# Patient Record
Sex: Male | Born: 1935
Health system: Southern US, Community
[De-identification: ages and names within clinical notes are randomized; demographics above are authoritative.]

## PROBLEM LIST (undated history)

## (undated) DIAGNOSIS — Z951 Presence of aortocoronary bypass graft: Secondary | ICD-10-CM

## (undated) DIAGNOSIS — I639 Cerebral infarction, unspecified: Secondary | ICD-10-CM

## (undated) DIAGNOSIS — F329 Major depressive disorder, single episode, unspecified: Secondary | ICD-10-CM

## (undated) DIAGNOSIS — M48062 Spinal stenosis, lumbar region with neurogenic claudication: Secondary | ICD-10-CM

## (undated) DIAGNOSIS — I219 Acute myocardial infarction, unspecified: Secondary | ICD-10-CM

## (undated) DIAGNOSIS — K219 Gastro-esophageal reflux disease without esophagitis: Secondary | ICD-10-CM

## (undated) DIAGNOSIS — R0789 Other chest pain: Secondary | ICD-10-CM

## (undated) DIAGNOSIS — R0600 Dyspnea, unspecified: Secondary | ICD-10-CM

## (undated) DIAGNOSIS — M21372 Foot drop, left foot: Secondary | ICD-10-CM

## (undated) DIAGNOSIS — Z66 Do not resuscitate: Secondary | ICD-10-CM

## (undated) DIAGNOSIS — R42 Dizziness and giddiness: Secondary | ICD-10-CM

## (undated) DIAGNOSIS — M1990 Unspecified osteoarthritis, unspecified site: Secondary | ICD-10-CM

## (undated) DIAGNOSIS — G4730 Sleep apnea, unspecified: Secondary | ICD-10-CM

## (undated) DIAGNOSIS — M21371 Foot drop, right foot: Secondary | ICD-10-CM

## (undated) DIAGNOSIS — F32A Depression, unspecified: Secondary | ICD-10-CM

## (undated) DIAGNOSIS — F0390 Unspecified dementia without behavioral disturbance: Secondary | ICD-10-CM

## (undated) DIAGNOSIS — E785 Hyperlipidemia, unspecified: Secondary | ICD-10-CM

## (undated) DIAGNOSIS — I2510 Atherosclerotic heart disease of native coronary artery without angina pectoris: Secondary | ICD-10-CM

## (undated) DIAGNOSIS — I10 Essential (primary) hypertension: Secondary | ICD-10-CM

## (undated) DIAGNOSIS — G629 Polyneuropathy, unspecified: Secondary | ICD-10-CM

## (undated) DIAGNOSIS — I350 Nonrheumatic aortic (valve) stenosis: Secondary | ICD-10-CM

## (undated) HISTORY — DX: Spinal stenosis, lumbar region with neurogenic claudication: M48.062

## (undated) HISTORY — DX: Sleep apnea, unspecified: G47.30

## (undated) HISTORY — PX: HEMORROIDECTOMY: SUR656

## (undated) HISTORY — DX: Foot drop, left foot: M21.372

## (undated) HISTORY — DX: Nonrheumatic aortic (valve) stenosis: I35.0

## (undated) HISTORY — DX: Do not resuscitate: Z66

## (undated) HISTORY — DX: Foot drop, right foot: M21.371

## (undated) HISTORY — DX: Polyneuropathy, unspecified: G62.9

## (undated) HISTORY — PX: CORONARY ARTERY BYPASS GRAFT: SHX141

## (undated) HISTORY — PX: CATARACT EXTRACTION, BILATERAL: SHX1313

## (undated) HISTORY — PX: EYE SURGERY: SHX253

## (undated) HISTORY — DX: Atherosclerotic heart disease of native coronary artery without angina pectoris: I25.10

## (undated) HISTORY — DX: Presence of aortocoronary bypass graft: Z95.1

## (undated) HISTORY — DX: Dizziness and giddiness: R42

## (undated) HISTORY — DX: Major depressive disorder, single episode, unspecified: F32.9

## (undated) HISTORY — PX: CARDIAC SURGERY: SHX584

## (undated) HISTORY — DX: Cerebral infarction, unspecified: I63.9

## (undated) HISTORY — PX: COLONOSCOPY: SHX174

## (undated) HISTORY — DX: Other chest pain: R07.89

## (undated) HISTORY — DX: Essential (primary) hypertension: I10

## (undated) HISTORY — DX: Unspecified dementia, unspecified severity, without behavioral disturbance, psychotic disturbance, mood disturbance, and anxiety: F03.90

## (undated) HISTORY — PX: CARDIAC CATHETERIZATION: SHX172

## (undated) HISTORY — DX: Depression, unspecified: F32.A

## (undated) HISTORY — DX: Hyperlipidemia, unspecified: E78.5

## (undated) HISTORY — DX: Unspecified osteoarthritis, unspecified site: M19.90

---

## 2014-04-26 DIAGNOSIS — H2513 Age-related nuclear cataract, bilateral: Secondary | ICD-10-CM | POA: Diagnosis not present

## 2014-04-29 DIAGNOSIS — I2510 Atherosclerotic heart disease of native coronary artery without angina pectoris: Secondary | ICD-10-CM | POA: Diagnosis not present

## 2014-04-29 DIAGNOSIS — E785 Hyperlipidemia, unspecified: Secondary | ICD-10-CM | POA: Diagnosis not present

## 2014-04-29 DIAGNOSIS — Z6829 Body mass index (BMI) 29.0-29.9, adult: Secondary | ICD-10-CM | POA: Diagnosis not present

## 2014-04-29 DIAGNOSIS — I10 Essential (primary) hypertension: Secondary | ICD-10-CM | POA: Diagnosis not present

## 2014-05-25 DIAGNOSIS — M21372 Foot drop, left foot: Secondary | ICD-10-CM | POA: Diagnosis not present

## 2014-06-14 DIAGNOSIS — M1990 Unspecified osteoarthritis, unspecified site: Secondary | ICD-10-CM | POA: Diagnosis not present

## 2014-10-26 DIAGNOSIS — Z6828 Body mass index (BMI) 28.0-28.9, adult: Secondary | ICD-10-CM | POA: Diagnosis not present

## 2014-10-26 DIAGNOSIS — I10 Essential (primary) hypertension: Secondary | ICD-10-CM | POA: Diagnosis not present

## 2014-10-26 DIAGNOSIS — M4010 Other secondary kyphosis, site unspecified: Secondary | ICD-10-CM | POA: Diagnosis not present

## 2014-10-26 DIAGNOSIS — Z1389 Encounter for screening for other disorder: Secondary | ICD-10-CM | POA: Diagnosis not present

## 2014-10-26 DIAGNOSIS — Z0000 Encounter for general adult medical examination without abnormal findings: Secondary | ICD-10-CM | POA: Diagnosis not present

## 2014-10-26 DIAGNOSIS — I2510 Atherosclerotic heart disease of native coronary artery without angina pectoris: Secondary | ICD-10-CM | POA: Diagnosis not present

## 2014-10-26 DIAGNOSIS — N401 Enlarged prostate with lower urinary tract symptoms: Secondary | ICD-10-CM | POA: Diagnosis not present

## 2014-10-26 DIAGNOSIS — I739 Peripheral vascular disease, unspecified: Secondary | ICD-10-CM | POA: Diagnosis not present

## 2014-10-26 DIAGNOSIS — E785 Hyperlipidemia, unspecified: Secondary | ICD-10-CM | POA: Diagnosis not present

## 2014-11-09 DIAGNOSIS — Z6828 Body mass index (BMI) 28.0-28.9, adult: Secondary | ICD-10-CM | POA: Diagnosis not present

## 2014-11-09 DIAGNOSIS — M7550 Bursitis of unspecified shoulder: Secondary | ICD-10-CM | POA: Diagnosis not present

## 2014-11-25 DIAGNOSIS — M21379 Foot drop, unspecified foot: Secondary | ICD-10-CM | POA: Diagnosis not present

## 2014-11-25 DIAGNOSIS — M1612 Unilateral primary osteoarthritis, left hip: Secondary | ICD-10-CM | POA: Diagnosis not present

## 2014-11-25 DIAGNOSIS — M5432 Sciatica, left side: Secondary | ICD-10-CM | POA: Diagnosis not present

## 2015-02-01 DIAGNOSIS — J180 Bronchopneumonia, unspecified organism: Secondary | ICD-10-CM | POA: Diagnosis not present

## 2015-02-01 DIAGNOSIS — Z6828 Body mass index (BMI) 28.0-28.9, adult: Secondary | ICD-10-CM | POA: Diagnosis not present

## 2015-02-01 DIAGNOSIS — J019 Acute sinusitis, unspecified: Secondary | ICD-10-CM | POA: Diagnosis not present

## 2015-04-19 DIAGNOSIS — M25552 Pain in left hip: Secondary | ICD-10-CM | POA: Diagnosis not present

## 2015-04-19 DIAGNOSIS — M549 Dorsalgia, unspecified: Secondary | ICD-10-CM | POA: Diagnosis not present

## 2015-04-19 DIAGNOSIS — M5416 Radiculopathy, lumbar region: Secondary | ICD-10-CM | POA: Diagnosis not present

## 2015-04-27 DIAGNOSIS — I10 Essential (primary) hypertension: Secondary | ICD-10-CM | POA: Diagnosis not present

## 2015-04-27 DIAGNOSIS — I2510 Atherosclerotic heart disease of native coronary artery without angina pectoris: Secondary | ICD-10-CM | POA: Diagnosis not present

## 2015-04-27 DIAGNOSIS — E785 Hyperlipidemia, unspecified: Secondary | ICD-10-CM | POA: Diagnosis not present

## 2015-04-27 DIAGNOSIS — N401 Enlarged prostate with lower urinary tract symptoms: Secondary | ICD-10-CM | POA: Diagnosis not present

## 2015-04-27 DIAGNOSIS — Z6828 Body mass index (BMI) 28.0-28.9, adult: Secondary | ICD-10-CM | POA: Diagnosis not present

## 2015-05-03 DIAGNOSIS — M4806 Spinal stenosis, lumbar region: Secondary | ICD-10-CM | POA: Diagnosis not present

## 2015-05-03 DIAGNOSIS — M5117 Intervertebral disc disorders with radiculopathy, lumbosacral region: Secondary | ICD-10-CM | POA: Diagnosis not present

## 2015-05-04 DIAGNOSIS — M4806 Spinal stenosis, lumbar region: Secondary | ICD-10-CM | POA: Diagnosis not present

## 2015-05-09 DIAGNOSIS — J180 Bronchopneumonia, unspecified organism: Secondary | ICD-10-CM | POA: Diagnosis not present

## 2015-05-09 DIAGNOSIS — J9801 Acute bronchospasm: Secondary | ICD-10-CM | POA: Diagnosis not present

## 2015-06-06 DIAGNOSIS — M7582 Other shoulder lesions, left shoulder: Secondary | ICD-10-CM | POA: Diagnosis not present

## 2015-06-06 DIAGNOSIS — Z6827 Body mass index (BMI) 27.0-27.9, adult: Secondary | ICD-10-CM | POA: Diagnosis not present

## 2015-06-06 DIAGNOSIS — F418 Other specified anxiety disorders: Secondary | ICD-10-CM | POA: Diagnosis not present

## 2015-06-24 DIAGNOSIS — J019 Acute sinusitis, unspecified: Secondary | ICD-10-CM | POA: Diagnosis not present

## 2015-07-11 DIAGNOSIS — F329 Major depressive disorder, single episode, unspecified: Secondary | ICD-10-CM | POA: Diagnosis not present

## 2015-07-25 DIAGNOSIS — M129 Arthropathy, unspecified: Secondary | ICD-10-CM | POA: Diagnosis not present

## 2015-07-25 DIAGNOSIS — G5602 Carpal tunnel syndrome, left upper limb: Secondary | ICD-10-CM | POA: Diagnosis not present

## 2015-08-23 DIAGNOSIS — J309 Allergic rhinitis, unspecified: Secondary | ICD-10-CM | POA: Diagnosis not present

## 2015-08-23 DIAGNOSIS — G20 Parkinson's disease: Secondary | ICD-10-CM | POA: Diagnosis not present

## 2015-08-29 DIAGNOSIS — G250 Essential tremor: Secondary | ICD-10-CM | POA: Diagnosis not present

## 2015-08-29 DIAGNOSIS — R251 Tremor, unspecified: Secondary | ICD-10-CM | POA: Diagnosis not present

## 2015-08-29 DIAGNOSIS — M4806 Spinal stenosis, lumbar region: Secondary | ICD-10-CM | POA: Diagnosis not present

## 2015-08-29 DIAGNOSIS — G609 Hereditary and idiopathic neuropathy, unspecified: Secondary | ICD-10-CM | POA: Diagnosis not present

## 2015-08-29 DIAGNOSIS — M21372 Foot drop, left foot: Secondary | ICD-10-CM | POA: Diagnosis not present

## 2015-09-22 DIAGNOSIS — M25311 Other instability, right shoulder: Secondary | ICD-10-CM | POA: Diagnosis not present

## 2015-10-06 DIAGNOSIS — Z6826 Body mass index (BMI) 26.0-26.9, adult: Secondary | ICD-10-CM | POA: Diagnosis not present

## 2015-10-06 DIAGNOSIS — F329 Major depressive disorder, single episode, unspecified: Secondary | ICD-10-CM | POA: Diagnosis not present

## 2015-10-26 DIAGNOSIS — E785 Hyperlipidemia, unspecified: Secondary | ICD-10-CM | POA: Diagnosis not present

## 2015-10-26 DIAGNOSIS — M21372 Foot drop, left foot: Secondary | ICD-10-CM | POA: Diagnosis not present

## 2015-10-26 DIAGNOSIS — N400 Enlarged prostate without lower urinary tract symptoms: Secondary | ICD-10-CM | POA: Diagnosis not present

## 2015-10-26 DIAGNOSIS — I10 Essential (primary) hypertension: Secondary | ICD-10-CM | POA: Diagnosis not present

## 2015-10-26 DIAGNOSIS — I2510 Atherosclerotic heart disease of native coronary artery without angina pectoris: Secondary | ICD-10-CM | POA: Diagnosis not present

## 2015-10-31 DIAGNOSIS — M21372 Foot drop, left foot: Secondary | ICD-10-CM | POA: Diagnosis not present

## 2015-10-31 DIAGNOSIS — G250 Essential tremor: Secondary | ICD-10-CM | POA: Diagnosis not present

## 2015-10-31 DIAGNOSIS — M4806 Spinal stenosis, lumbar region: Secondary | ICD-10-CM | POA: Diagnosis not present

## 2015-11-01 DIAGNOSIS — E785 Hyperlipidemia, unspecified: Secondary | ICD-10-CM | POA: Diagnosis not present

## 2015-11-01 DIAGNOSIS — I2510 Atherosclerotic heart disease of native coronary artery without angina pectoris: Secondary | ICD-10-CM | POA: Diagnosis not present

## 2015-11-01 DIAGNOSIS — I110 Hypertensive heart disease with heart failure: Secondary | ICD-10-CM | POA: Insufficient documentation

## 2015-11-01 DIAGNOSIS — Z951 Presence of aortocoronary bypass graft: Secondary | ICD-10-CM

## 2015-11-01 DIAGNOSIS — I10 Essential (primary) hypertension: Secondary | ICD-10-CM | POA: Insufficient documentation

## 2015-11-01 HISTORY — DX: Hyperlipidemia, unspecified: E78.5

## 2015-11-01 HISTORY — DX: Essential (primary) hypertension: I10

## 2015-11-01 HISTORY — DX: Presence of aortocoronary bypass graft: Z95.1

## 2015-11-01 HISTORY — DX: Hypertensive heart disease with heart failure: I11.0

## 2015-11-01 HISTORY — DX: Atherosclerotic heart disease of native coronary artery without angina pectoris: I25.10

## 2015-11-16 DIAGNOSIS — I2510 Atherosclerotic heart disease of native coronary artery without angina pectoris: Secondary | ICD-10-CM | POA: Diagnosis not present

## 2015-11-16 DIAGNOSIS — Z951 Presence of aortocoronary bypass graft: Secondary | ICD-10-CM | POA: Diagnosis not present

## 2015-11-16 DIAGNOSIS — I10 Essential (primary) hypertension: Secondary | ICD-10-CM | POA: Diagnosis not present

## 2015-11-16 DIAGNOSIS — E785 Hyperlipidemia, unspecified: Secondary | ICD-10-CM | POA: Diagnosis not present

## 2015-12-01 DIAGNOSIS — I2510 Atherosclerotic heart disease of native coronary artery without angina pectoris: Secondary | ICD-10-CM | POA: Diagnosis not present

## 2015-12-01 DIAGNOSIS — E785 Hyperlipidemia, unspecified: Secondary | ICD-10-CM | POA: Diagnosis not present

## 2015-12-01 DIAGNOSIS — Z951 Presence of aortocoronary bypass graft: Secondary | ICD-10-CM | POA: Diagnosis not present

## 2015-12-01 DIAGNOSIS — I10 Essential (primary) hypertension: Secondary | ICD-10-CM | POA: Diagnosis not present

## 2015-12-07 DIAGNOSIS — I2510 Atherosclerotic heart disease of native coronary artery without angina pectoris: Secondary | ICD-10-CM | POA: Diagnosis not present

## 2015-12-07 DIAGNOSIS — I10 Essential (primary) hypertension: Secondary | ICD-10-CM | POA: Diagnosis not present

## 2015-12-07 DIAGNOSIS — E785 Hyperlipidemia, unspecified: Secondary | ICD-10-CM | POA: Diagnosis not present

## 2015-12-07 DIAGNOSIS — Z951 Presence of aortocoronary bypass graft: Secondary | ICD-10-CM | POA: Diagnosis not present

## 2015-12-19 DIAGNOSIS — M25312 Other instability, left shoulder: Secondary | ICD-10-CM | POA: Diagnosis not present

## 2015-12-30 DIAGNOSIS — J40 Bronchitis, not specified as acute or chronic: Secondary | ICD-10-CM | POA: Diagnosis not present

## 2016-01-06 DIAGNOSIS — H25811 Combined forms of age-related cataract, right eye: Secondary | ICD-10-CM | POA: Diagnosis not present

## 2016-01-17 DIAGNOSIS — S41101A Unspecified open wound of right upper arm, initial encounter: Secondary | ICD-10-CM | POA: Diagnosis not present

## 2016-01-17 DIAGNOSIS — M5126 Other intervertebral disc displacement, lumbar region: Secondary | ICD-10-CM | POA: Diagnosis not present

## 2016-01-24 DIAGNOSIS — L03113 Cellulitis of right upper limb: Secondary | ICD-10-CM | POA: Diagnosis not present

## 2016-01-24 DIAGNOSIS — S41101D Unspecified open wound of right upper arm, subsequent encounter: Secondary | ICD-10-CM | POA: Diagnosis not present

## 2016-02-07 DIAGNOSIS — I2510 Atherosclerotic heart disease of native coronary artery without angina pectoris: Secondary | ICD-10-CM | POA: Diagnosis not present

## 2016-02-07 DIAGNOSIS — I252 Old myocardial infarction: Secondary | ICD-10-CM | POA: Diagnosis not present

## 2016-02-07 DIAGNOSIS — H9190 Unspecified hearing loss, unspecified ear: Secondary | ICD-10-CM | POA: Diagnosis not present

## 2016-02-07 DIAGNOSIS — I10 Essential (primary) hypertension: Secondary | ICD-10-CM | POA: Diagnosis not present

## 2016-02-07 DIAGNOSIS — Z950 Presence of cardiac pacemaker: Secondary | ICD-10-CM | POA: Diagnosis not present

## 2016-02-07 DIAGNOSIS — Z87891 Personal history of nicotine dependence: Secondary | ICD-10-CM | POA: Diagnosis not present

## 2016-02-07 DIAGNOSIS — H25811 Combined forms of age-related cataract, right eye: Secondary | ICD-10-CM | POA: Diagnosis not present

## 2016-02-07 DIAGNOSIS — Z951 Presence of aortocoronary bypass graft: Secondary | ICD-10-CM | POA: Diagnosis not present

## 2016-02-14 DIAGNOSIS — H04123 Dry eye syndrome of bilateral lacrimal glands: Secondary | ICD-10-CM | POA: Diagnosis not present

## 2016-04-03 DIAGNOSIS — I639 Cerebral infarction, unspecified: Secondary | ICD-10-CM

## 2016-04-03 DIAGNOSIS — Z66 Do not resuscitate: Secondary | ICD-10-CM

## 2016-04-03 HISTORY — DX: Do not resuscitate: Z66

## 2016-04-03 HISTORY — DX: Cerebral infarction, unspecified: I63.9

## 2016-04-19 DIAGNOSIS — R42 Dizziness and giddiness: Secondary | ICD-10-CM | POA: Insufficient documentation

## 2016-04-19 HISTORY — DX: Dizziness and giddiness: R42

## 2016-07-27 DIAGNOSIS — I951 Orthostatic hypotension: Secondary | ICD-10-CM | POA: Diagnosis not present

## 2016-08-27 DIAGNOSIS — I10 Essential (primary) hypertension: Secondary | ICD-10-CM | POA: Diagnosis not present

## 2016-08-27 DIAGNOSIS — I2510 Atherosclerotic heart disease of native coronary artery without angina pectoris: Secondary | ICD-10-CM | POA: Diagnosis not present

## 2016-08-27 DIAGNOSIS — E785 Hyperlipidemia, unspecified: Secondary | ICD-10-CM | POA: Diagnosis not present

## 2016-08-27 DIAGNOSIS — M47816 Spondylosis without myelopathy or radiculopathy, lumbar region: Secondary | ICD-10-CM | POA: Diagnosis not present

## 2016-08-28 DIAGNOSIS — I10 Essential (primary) hypertension: Secondary | ICD-10-CM | POA: Diagnosis not present

## 2016-10-08 DIAGNOSIS — M47816 Spondylosis without myelopathy or radiculopathy, lumbar region: Secondary | ICD-10-CM | POA: Diagnosis not present

## 2016-10-17 DIAGNOSIS — M4725 Other spondylosis with radiculopathy, thoracolumbar region: Secondary | ICD-10-CM | POA: Diagnosis not present

## 2016-11-14 DIAGNOSIS — Z789 Other specified health status: Secondary | ICD-10-CM | POA: Diagnosis not present

## 2016-11-14 DIAGNOSIS — M549 Dorsalgia, unspecified: Secondary | ICD-10-CM | POA: Diagnosis not present

## 2016-11-14 DIAGNOSIS — R269 Unspecified abnormalities of gait and mobility: Secondary | ICD-10-CM | POA: Diagnosis not present

## 2016-11-14 DIAGNOSIS — Z719 Counseling, unspecified: Secondary | ICD-10-CM | POA: Diagnosis not present

## 2016-11-15 DIAGNOSIS — M21372 Foot drop, left foot: Secondary | ICD-10-CM | POA: Diagnosis not present

## 2016-11-15 DIAGNOSIS — M21371 Foot drop, right foot: Secondary | ICD-10-CM | POA: Diagnosis not present

## 2016-11-20 DIAGNOSIS — R42 Dizziness and giddiness: Secondary | ICD-10-CM | POA: Diagnosis not present

## 2016-11-20 DIAGNOSIS — H9190 Unspecified hearing loss, unspecified ear: Secondary | ICD-10-CM | POA: Diagnosis not present

## 2016-11-20 DIAGNOSIS — R269 Unspecified abnormalities of gait and mobility: Secondary | ICD-10-CM | POA: Diagnosis not present

## 2016-11-20 DIAGNOSIS — R29898 Other symptoms and signs involving the musculoskeletal system: Secondary | ICD-10-CM | POA: Diagnosis not present

## 2016-11-20 DIAGNOSIS — R413 Other amnesia: Secondary | ICD-10-CM | POA: Diagnosis not present

## 2016-11-20 DIAGNOSIS — M5031 Other cervical disc degeneration,  high cervical region: Secondary | ICD-10-CM | POA: Diagnosis not present

## 2016-11-22 DIAGNOSIS — Z719 Counseling, unspecified: Secondary | ICD-10-CM | POA: Diagnosis not present

## 2016-11-22 DIAGNOSIS — M549 Dorsalgia, unspecified: Secondary | ICD-10-CM | POA: Diagnosis not present

## 2016-11-22 DIAGNOSIS — Z789 Other specified health status: Secondary | ICD-10-CM | POA: Diagnosis not present

## 2016-11-22 DIAGNOSIS — G629 Polyneuropathy, unspecified: Secondary | ICD-10-CM | POA: Diagnosis not present

## 2016-11-23 DIAGNOSIS — I350 Nonrheumatic aortic (valve) stenosis: Secondary | ICD-10-CM | POA: Diagnosis not present

## 2016-11-23 DIAGNOSIS — G250 Essential tremor: Secondary | ICD-10-CM | POA: Diagnosis not present

## 2016-11-27 DIAGNOSIS — M5127 Other intervertebral disc displacement, lumbosacral region: Secondary | ICD-10-CM | POA: Diagnosis not present

## 2016-11-27 DIAGNOSIS — M5126 Other intervertebral disc displacement, lumbar region: Secondary | ICD-10-CM | POA: Diagnosis not present

## 2016-11-27 DIAGNOSIS — M47816 Spondylosis without myelopathy or radiculopathy, lumbar region: Secondary | ICD-10-CM | POA: Diagnosis not present

## 2016-12-12 DIAGNOSIS — Z719 Counseling, unspecified: Secondary | ICD-10-CM | POA: Diagnosis not present

## 2016-12-12 DIAGNOSIS — M519 Unspecified thoracic, thoracolumbar and lumbosacral intervertebral disc disorder: Secondary | ICD-10-CM | POA: Diagnosis not present

## 2016-12-12 DIAGNOSIS — M549 Dorsalgia, unspecified: Secondary | ICD-10-CM | POA: Diagnosis not present

## 2016-12-12 DIAGNOSIS — G629 Polyneuropathy, unspecified: Secondary | ICD-10-CM | POA: Diagnosis not present

## 2016-12-12 DIAGNOSIS — Z789 Other specified health status: Secondary | ICD-10-CM | POA: Diagnosis not present

## 2016-12-20 DIAGNOSIS — G603 Idiopathic progressive neuropathy: Secondary | ICD-10-CM | POA: Diagnosis not present

## 2017-01-18 DIAGNOSIS — M21372 Foot drop, left foot: Secondary | ICD-10-CM | POA: Insufficient documentation

## 2017-01-18 DIAGNOSIS — M21371 Foot drop, right foot: Secondary | ICD-10-CM

## 2017-01-18 DIAGNOSIS — M48062 Spinal stenosis, lumbar region with neurogenic claudication: Secondary | ICD-10-CM

## 2017-01-18 HISTORY — DX: Foot drop, right foot: M21.371

## 2017-01-18 HISTORY — DX: Spinal stenosis, lumbar region with neurogenic claudication: M48.062

## 2017-01-30 DIAGNOSIS — M25512 Pain in left shoulder: Secondary | ICD-10-CM | POA: Diagnosis not present

## 2017-04-27 ENCOUNTER — Other Ambulatory Visit: Payer: Self-pay | Admitting: Cardiology

## 2017-05-01 ENCOUNTER — Other Ambulatory Visit: Payer: Self-pay | Admitting: *Deleted

## 2017-05-01 ENCOUNTER — Encounter: Payer: Self-pay | Admitting: *Deleted

## 2017-05-01 DIAGNOSIS — I2510 Atherosclerotic heart disease of native coronary artery without angina pectoris: Secondary | ICD-10-CM | POA: Diagnosis not present

## 2017-05-01 DIAGNOSIS — E785 Hyperlipidemia, unspecified: Secondary | ICD-10-CM | POA: Diagnosis not present

## 2017-05-01 DIAGNOSIS — M47816 Spondylosis without myelopathy or radiculopathy, lumbar region: Secondary | ICD-10-CM | POA: Diagnosis not present

## 2017-05-01 DIAGNOSIS — I10 Essential (primary) hypertension: Secondary | ICD-10-CM | POA: Diagnosis not present

## 2017-05-02 ENCOUNTER — Encounter: Payer: Self-pay | Admitting: Cardiology

## 2017-05-02 ENCOUNTER — Ambulatory Visit (INDEPENDENT_AMBULATORY_CARE_PROVIDER_SITE_OTHER): Payer: Medicare Other | Admitting: Cardiology

## 2017-05-02 VITALS — BP 140/64 | HR 74 | Ht 66.0 in | Wt 175.0 lb

## 2017-05-02 DIAGNOSIS — I10 Essential (primary) hypertension: Secondary | ICD-10-CM | POA: Diagnosis not present

## 2017-05-02 DIAGNOSIS — E785 Hyperlipidemia, unspecified: Secondary | ICD-10-CM

## 2017-05-02 DIAGNOSIS — Z951 Presence of aortocoronary bypass graft: Secondary | ICD-10-CM

## 2017-05-02 DIAGNOSIS — I440 Atrioventricular block, first degree: Secondary | ICD-10-CM | POA: Diagnosis not present

## 2017-05-02 DIAGNOSIS — R42 Dizziness and giddiness: Secondary | ICD-10-CM

## 2017-05-02 DIAGNOSIS — I350 Nonrheumatic aortic (valve) stenosis: Secondary | ICD-10-CM | POA: Insufficient documentation

## 2017-05-02 DIAGNOSIS — I2510 Atherosclerotic heart disease of native coronary artery without angina pectoris: Secondary | ICD-10-CM

## 2017-05-02 NOTE — Progress Notes (Signed)
Cardiology Office Note:    Date:  05/02/2017   ID:  Jack Hodges, DOB July 19, 1930, MRN [id number]  PCP:  Abigail Miyamoto, MD  Cardiologist:  Gypsy Balsam, MD    Referring MD: No ref. provider found   Chief Complaint  Patient presents with  . Annual Exam  Doing well but complaining of having some shortness of breath and dizziness  History of Present Illness:    Jack Hodges is a 84 y.o. male coronary artery disease, noncritical aortic stenosis, history of hypertension dyslipidemia comes to our office for regular follow-up.  Complaint of having shortness of breath with exertion as well as some dizziness but no passing out.  Past Medical History:  Diagnosis Date  . Arthritis   . Bilateral foot-drop 01/18/2017  . Coronary artery disease involving native coronary artery of native heart without angina pectoris 11/01/2015   Overview:  Bypass surgery in 1996 Overview:  Overview:  Bypass surgery in 1996  Last Assessment & Plan:  Continue his current regimen.  Follow up as noted.  . CVA (cerebral vascular accident) (HCC) 04/03/2016   Last Assessment & Plan:  The patient underwent extensive CVA workup.  MRI is negative.  He does have aortic stenosis by echocardiogram.  Upon review of Care everywhere he is followed by Dr. Bing Matter, cardiologist for this.  Carotid ultrasound negative for hemodynamically significant stenosis.  Lipid panel within normal limits.  He has not had any further episodes of dizziness or nausea vomiting.   . Dementia   . Depression   . Dizziness 04/19/2016  . DNR (do not resuscitate) 04/03/2016   Overview:  I had an extensive discussion with the patient on 04/03/2016 regarding his end of life wishes.  He and his daughter agree that do not resuscitate is in keeping with his beliefs.  . Dyslipidemia 11/01/2015   Last Assessment & Plan:  Formatting of this note might be different from the original. Cholesterol 136   Triglycerides  83  HDL  50.1  LDL Calculated  69  VLDL  Cholesterol Cal  17   Continue statin.  . Essential hypertension 11/01/2015   Last Assessment & Plan:  Resume home medications.  Follow-up as noted above.  . Hypertension   . Sleep apnea   . 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    Past Surgical History:  Procedure Laterality Date  . CARDIAC SURGERY      Current Medications: Current Meds  Medication Sig  . ALPRAZolam (XANAX) 0.5 MG tablet Take by mouth daily as needed.   Marland Kitchen aspirin EC 81 MG tablet Take 81 mg by mouth daily.  . B Complex Vitamins (VITAMIN B COMPLEX PO) Take by mouth.  . carbidopa-levodopa (SINEMET IR) 25-100 MG tablet Take by mouth as needed.   . hydrochlorothiazide (HYDRODIURIL) 25 MG tablet Take 25 mg by mouth daily.  Marland Kitchen lisinopril (PRINIVIL,ZESTRIL) 10 MG tablet Take by mouth.  Marland Kitchen omeprazole (PRILOSEC) 20 MG capsule Take 20 mg by mouth.  . oxyCODONE-acetaminophen (PERCOCET/ROXICET) 5-325 MG tablet Take by mouth.  . simvastatin (ZOCOR) 80 MG tablet   . tamsulosin (FLOMAX) 0.4 MG CAPS capsule as needed.     Allergies:   Patient has no known allergies.   Social History   Socioeconomic History  . Marital status: Married    Spouse name: None  . Number of children: None  .  Years of education: None  . Highest education level: None  Social Needs  . Financial resource strain: None  . Food insecurity - worry: None  . Food insecurity - inability: None  . Transportation needs - medical: None  . Transportation needs - non-medical: None  Occupational History  . None  Tobacco Use  . Smoking status: Former Smoker    Last attempt to quit: 05/02/1962    Years since quitting: 55.0  . Smokeless tobacco: Never Used  Substance and Sexual Activity  . Alcohol use: No    Frequency: Never  . Drug use: No  . Sexual activity: None  Other Topics Concern  . None  Social History Narrative    . None     Family History: The patient's family history includes Arthritis in his brother and father; Cancer in his brother and mother; Diabetes in his mother; Hypertension in his father; Kidney disease in his mother; Migraines in his father; Sleep disorder in his father and mother; Stroke in his mother. ROS:   Please see the history of present illness.    All 14 point review of systems negative except as described per history of present illness  EKGs/Labs/Other Studies Reviewed:      Recent Labs: No results found for requested labs within last 8760 hours.  Recent Lipid Panel No results found for: CHOL, TRIG, HDL, CHOLHDL, VLDL, LDLCALC, LDLDIRECT  Physical Exam:    VS:  BP 140/64 (BP Location: Right Arm, Patient Position: Sitting, Cuff Size: Normal)   Pulse 74   Ht 5\' 6"  (1.676 m)   Wt 175 lb (79.4 kg)   SpO2 96%   BMI 28.25 kg/m     Wt Readings from Last 3 Encounters:  05/02/17 175 lb (79.4 kg)     GEN:  Well nourished, well developed in no acute distress HEENT: Normal NECK: No JVD; left carotid bruit heard LYMPHATICS: No lymphadenopathy CARDIAC: RRR, systolic ejection murmur grade 2/6 best heard at the right upper portion of the sternum, no rubs, no gallops RESPIRATORY:  Clear to auscultation without rales, wheezing or rhonchi  ABDOMEN: Soft, non-tender, non-distended MUSCULOSKELETAL:  No edema; No deformity  SKIN: Warm and dry LOWER EXTREMITIES: no swelling NEUROLOGIC:  Alert and oriented x 3 PSYCHIATRIC:  Normal affect   ASSESSMENT:    1. Coronary artery disease involving native coronary artery of native heart without angina pectoris   2. Essential hypertension   3. Dyslipidemia   4. Status post coronary artery bypass graft    PLAN:    In order of problems listed above:  1. Coronary artery disease: Stable without any symptoms we will continue present management. 2. Essential hypertension: Blood pressure appears to be controlled continue present  management. 3. Dyslipidemia: He just had fasting lipid profile done yesterday and his primary care physician office will call the office and get report of it 4. Status post coronary artery bypass graft: Doing well from that point of view. 5. Dizziness with first-degree AV block sinus bradycardia right bundle branch block.  I asked him to wear Holter monitor. 6. Dyspnea on exertion: Echocardiogram will be done   Medication Adjustments/Labs and Tests Ordered: Current medicines are reviewed at length with the patient today.  Concerns regarding medicines are outlined above.  No orders of the defined types were placed in this encounter.  Medication changes: No orders of the defined types were placed in this encounter.   Signed, Georgeanna Lea, MD, [facility] 05/02/2017 12:09 PM    [facility]

## 2017-05-02 NOTE — Patient Instructions (Signed)
Medication Instructions:  Your physician recommends that you continue on your current medications as directed. Please refer to the Current Medication list given to you today.  Labwork: None  Testing/Procedures: Your physician has requested that you have an echocardiogram. Echocardiography is a painless test that uses sound waves to create images of your heart. It provides your doctor with information about the size and shape of your heart and how well your heart's chambers and valves are working. This procedure takes approximately one hour. There are no restrictions for this procedure.  Your physician has recommended that you wear a holter monitor. Holter monitors are medical devices that record the heart's electrical activity. Doctors most often use these monitors to diagnose arrhythmias. Arrhythmias are problems with the speed or rhythm of the heartbeat. The monitor is a small, portable device. You can wear one while you do your normal daily activities. This is usually used to diagnose what is causing palpitations/syncope (passing out).  Your physician has requested that you have a carotid duplex. This test is an ultrasound of the carotid arteries in your neck. It looks at blood flow through these arteries that supply the brain with blood. Allow one hour for this exam. There are no restrictions or special instructions.  Follow-Up: Your physician recommends that you schedule a follow-up appointment in: 1 month  Any Other Special Instructions Will Be Listed Below (If Applicable).     If you need a refill on your cardiac medications before your next appointment, please call your pharmacy.   [facility]  Garey Ham, RN, BSN    Holter Monitoring A Holter monitor is a small device that is used to detect abnormal heart rhythms. It clips to your clothing and is connected by wires to flat, sticky disks (electrodes) that attach to your chest. It is worn continuously for 24-48 hours. Follow  these instructions at home:  Wear your Holter monitor at all times, even while exercising and sleeping, for as long as directed by your health care provider.  Make sure that the Holter monitor is safely clipped to your clothing or close to your body as recommended by your health care provider.  Do not get the monitor or wires wet.  Do not put body lotion or moisturizer on your chest.  Keep your skin clean.  Keep a diary of your daily activities, such as walking and doing chores. If you feel that your heartbeat is abnormal or that your heart is fluttering or skipping a beat: ? Record what you are doing when it happens. ? Record what time of day the symptoms occur.  Return your Holter monitor as directed by your health care provider.  Keep all follow-up visits as directed by your health care provider. This is important. Get help right away if:  You feel lightheaded or you faint.  You have trouble breathing.  You feel pain in your chest, upper arm, or jaw.  You feel sick to your stomach and your skin is pale, cool, or damp.  You heartbeat feels unusual or abnormal. This information is not intended to replace advice given to you by your health care provider. Make sure you discuss any questions you have with your health care provider. Document Released: 12/09/2003 Document Revised: 08/18/2015 Document Reviewed: 10/19/2013 Elsevier Interactive Patient Education  2018 [employer]. Echocardiogram An echocardiogram, or echocardiography, uses sound waves (ultrasound) to produce an image of your heart. The echocardiogram is simple, painless, obtained within a short period of time, and offers valuable  information to your health care provider. The images from an echocardiogram can provide information such as:  Evidence of coronary artery disease (CAD).  Heart size.  Heart muscle function.  Heart valve function.  Aneurysm detection.  Evidence of a past heart attack.  Fluid buildup  around the heart.  Heart muscle thickening.  Assess heart valve function.  Tell a health care provider about:  Any allergies you have.  All medicines you are taking, including vitamins, herbs, eye drops, creams, and over-the-counter medicines.  Any problems you or family members have had with anesthetic medicines.  Any blood disorders you have.  Any surgeries you have had.  Any medical conditions you have.  Whether you are pregnant or may be pregnant. What happens before the procedure? No special preparation is needed. Eat and drink normally. What happens during the procedure?  In order to produce an image of your heart, gel will be applied to your chest and a wand-like tool (transducer) will be moved over your chest. The gel will help transmit the sound waves from the transducer. The sound waves will harmlessly bounce off your heart to allow the heart images to be captured in real-time motion. These images will then be recorded.  You may need an IV to receive a medicine that improves the quality of the pictures. What happens after the procedure? You may return to your normal schedule including diet, activities, and medicines, unless your health care provider tells you otherwise. This information is not intended to replace advice given to you by your health care provider. Make sure you discuss any questions you have with your health care provider. Document Released: 03/09/2000 Document Revised: 10/29/2015 Document Reviewed: 11/17/2012 Elsevier Interactive Patient Education  2017 [employer]

## 2017-05-03 NOTE — Addendum Note (Signed)
Addended by: Arville Care on: 05/03/2017 02:54 PM   Modules accepted: Orders

## 2017-05-17 DIAGNOSIS — M7061 Trochanteric bursitis, right hip: Secondary | ICD-10-CM | POA: Diagnosis not present

## 2017-05-23 ENCOUNTER — Ambulatory Visit (HOSPITAL_BASED_OUTPATIENT_CLINIC_OR_DEPARTMENT_OTHER)
Admission: RE | Admit: 2017-05-23 | Discharge: 2017-05-23 | Disposition: A | Discharge status: Home / Self Care | Payer: Medicare Other | Source: Ambulatory Visit | Attending: Cardiology | Admitting: Cardiology

## 2017-05-23 ENCOUNTER — Ambulatory Visit: Payer: Medicare Other

## 2017-05-23 DIAGNOSIS — I082 Rheumatic disorders of both aortic and tricuspid valves: Secondary | ICD-10-CM | POA: Diagnosis not present

## 2017-05-23 DIAGNOSIS — R42 Dizziness and giddiness: Secondary | ICD-10-CM

## 2017-05-23 DIAGNOSIS — E785 Hyperlipidemia, unspecified: Secondary | ICD-10-CM | POA: Diagnosis not present

## 2017-05-23 DIAGNOSIS — I119 Hypertensive heart disease without heart failure: Secondary | ICD-10-CM | POA: Insufficient documentation

## 2017-05-23 DIAGNOSIS — I440 Atrioventricular block, first degree: Secondary | ICD-10-CM | POA: Insufficient documentation

## 2017-05-23 DIAGNOSIS — Z951 Presence of aortocoronary bypass graft: Secondary | ICD-10-CM | POA: Diagnosis not present

## 2017-05-23 DIAGNOSIS — I77810 Thoracic aortic ectasia: Secondary | ICD-10-CM | POA: Insufficient documentation

## 2017-05-23 DIAGNOSIS — I2510 Atherosclerotic heart disease of native coronary artery without angina pectoris: Secondary | ICD-10-CM | POA: Insufficient documentation

## 2017-05-23 DIAGNOSIS — Z8673 Personal history of transient ischemic attack (TIA), and cerebral infarction without residual deficits: Secondary | ICD-10-CM | POA: Diagnosis not present

## 2017-05-23 LAB — VAS US CAROTID
LCCAPDIAS: -23 cm/s
LCCAPSYS: -129 cm/s
LEFT ECA DIAS: -16 cm/s
LEFT VERTEBRAL DIAS: -12 cm/s
LICADSYS: -116 cm/s
LICAPSYS: -116 cm/s
Left CCA dist dias: 18 cm/s
Left CCA dist sys: 115 cm/s
Left ICA dist dias: -20 cm/s
Left ICA prox dias: -23 cm/s
RCCADSYS: -75 cm/s
RIGHT ECA DIAS: -9 cm/s
Right CCA prox dias: 12 cm/s
Right CCA prox sys: 67 cm/s

## 2017-05-23 NOTE — Progress Notes (Signed)
Echocardiogram 2D Echocardiogram has been performed.  Jack Hodges 05/23/2017, 4:17 PM

## 2017-05-23 NOTE — Progress Notes (Signed)
Echocardiogram 2D Echocardiogram has been performed.  Jack Hodges 05/23/2017, 3:33 PM

## 2017-05-23 NOTE — Progress Notes (Signed)
Carotid duplex performed  Jimmy Corrie Reder RDCS 

## 2017-05-23 NOTE — Progress Notes (Signed)
Echocardiogram 2D Echocardiogram has been performed.  Dorothey Baseman 05/23/2017, 3:37 PM

## 2017-05-24 ENCOUNTER — Telehealth: Payer: Self-pay | Admitting: Cardiology

## 2017-05-24 NOTE — Telephone Encounter (Signed)
Patient informed of results.  

## 2017-05-24 NOTE — Telephone Encounter (Signed)
returning call

## 2017-05-24 NOTE — Telephone Encounter (Signed)
Left message to return call 

## 2017-06-03 ENCOUNTER — Ambulatory Visit: Payer: Medicare Other | Admitting: Cardiology

## 2017-06-14 ENCOUNTER — Encounter: Payer: Self-pay | Admitting: Cardiology

## 2017-06-14 ENCOUNTER — Ambulatory Visit (INDEPENDENT_AMBULATORY_CARE_PROVIDER_SITE_OTHER): Payer: Medicare Other | Admitting: Cardiology

## 2017-06-14 VITALS — BP 134/62 | HR 48 | Ht 66.0 in | Wt 173.8 lb

## 2017-06-14 DIAGNOSIS — I10 Essential (primary) hypertension: Secondary | ICD-10-CM | POA: Diagnosis not present

## 2017-06-14 DIAGNOSIS — Z951 Presence of aortocoronary bypass graft: Secondary | ICD-10-CM

## 2017-06-14 DIAGNOSIS — R42 Dizziness and giddiness: Secondary | ICD-10-CM

## 2017-06-14 DIAGNOSIS — I350 Nonrheumatic aortic (valve) stenosis: Secondary | ICD-10-CM

## 2017-06-14 DIAGNOSIS — I2510 Atherosclerotic heart disease of native coronary artery without angina pectoris: Secondary | ICD-10-CM

## 2017-06-14 NOTE — Progress Notes (Signed)
Cardiology Office Note:    Date:  06/14/2017   ID:  Jack Hodges, DOB 07/02/1930, MRN [id number]  PCP:  Abigail Miyamoto, MD  Cardiologist:  Gypsy Balsam, MD    Referring MD: Abigail Miyamoto,*   Chief Complaint  Patient presents with  . 1 month follow up  Doing well but still dizzy  History of Present Illness:    Jack Hodges is a 84 y.o. male with multiple medical issues.  The latest investigation has been focused on his dizziness.  He said when he walks he dizzy.  I did echocardiogram on him to look at the aortic valve it is stenotic but not significant enough to justify his symptoms, he did have echocardiogram which showed only mild to moderate aortic stenosis, Holter monitor he worried he did have symptoms that he was wearing it and there was some supraventricular tachycardia short episodes but no other pathology that could explain his symptoms.  Carotid ultrasounds was done which showed some narrowing but not significant to justify his symptoms.  I will refer him to urology for future evaluation.  He said he has been dizzy that way since the time of his stroke.  Past Medical History:  Diagnosis Date  . Arthritis   . Bilateral foot-drop 01/18/2017  . Coronary artery disease involving native coronary artery of native heart without angina pectoris 11/01/2015   Overview:  Bypass surgery in 1996 Overview:  Overview:  Bypass surgery in 1996  Last Assessment & Plan:  Continue his current regimen.  Follow up as noted.  . CVA (cerebral vascular accident) (HCC) 04/03/2016   Last Assessment & Plan:  The patient underwent extensive CVA workup.  MRI is negative.  He does have aortic stenosis by echocardiogram.  Upon review of Care everywhere he is followed by Dr. Bing Matter, cardiologist for this.  Carotid ultrasound negative for hemodynamically significant stenosis.  Lipid panel within normal limits.  He has not had any further episodes of dizziness or nausea vomiting.   . Dementia    . Depression   . Dizziness 04/19/2016  . DNR (do not resuscitate) 04/03/2016   Overview:  I had an extensive discussion with the patient on 04/03/2016 regarding his end of life wishes.  He and his daughter agree that do not resuscitate is in keeping with his beliefs.  . Dyslipidemia 11/01/2015   Last Assessment & Plan:  Formatting of this note might be different from the original. Cholesterol 136   Triglycerides  83  HDL  50.1  LDL Calculated  69  VLDL Cholesterol Cal  17   Continue statin.  . Essential hypertension 11/01/2015   Last Assessment & Plan:  Resume home medications.  Follow-up as noted above.  . Hypertension   . Sleep apnea   . 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    Past Surgical History:  Procedure Laterality Date  . CARDIAC SURGERY      Current Medications: Current Meds  Medication Sig  . ALPRAZolam (XANAX) 0.5 MG tablet Take by mouth daily as needed.   Marland Kitchen amLODipine (NORVASC) 10 MG tablet Take 10 mg by mouth.  Marland Kitchen aspirin EC 81 MG tablet Take 81 mg by mouth daily.  . B Complex Vitamins (VITAMIN B COMPLEX PO) Take by mouth.  . carbidopa-levodopa (SINEMET IR) 25-100 MG tablet Take by mouth as needed.   . hydrochlorothiazide (  HYDRODIURIL) 25 MG tablet Take 25 mg by mouth daily.  Marland Kitchen lisinopril (PRINIVIL,ZESTRIL) 10 MG tablet Take by mouth.  Marland Kitchen omeprazole (PRILOSEC) 20 MG capsule Take 20 mg by mouth.  . oxyCODONE-acetaminophen (PERCOCET/ROXICET) 5-325 MG tablet Take by mouth.  . rosuvastatin (CRESTOR) 20 MG tablet Take 20 mg by mouth daily.  . sertraline (ZOLOFT) 50 MG tablet 50 mg daily.  . tamsulosin (FLOMAX) 0.4 MG CAPS capsule as needed.     Allergies:   Patient has no known allergies.   Social History   Socioeconomic History  . Marital status: Married    Spouse name: Not on file  . Number of children: Not on file  . Years of  education: Not on file  . Highest education level: Not on file  Occupational History  . Not on file  Social Needs  . Financial resource strain: Not on file  . Food insecurity:    Worry: Not on file    Inability: Not on file  . Transportation needs:    Medical: Not on file    Non-medical: Not on file  Tobacco Use  . Smoking status: Former Smoker    Last attempt to quit: 05/02/1962    Years since quitting: 55.1  . Smokeless tobacco: Never Used  Substance and Sexual Activity  . Alcohol use: No    Frequency: Never  . Drug use: No  . Sexual activity: Not on file  Lifestyle  . Physical activity:    Days per week: Not on file    Minutes per session: Not on file  . Stress: Not on file  Relationships  . Social connections:    Talks on phone: Not on file    Gets together: Not on file    Attends religious service: Not on file    Active member of club or organization: Not on file    Attends meetings of clubs or organizations: Not on file    Relationship status: Not on file  Other Topics Concern  . Not on file  Social History Narrative  . Not on file     Family History: The patient's family history includes Arthritis in his brother and father; Cancer in his brother and mother; Diabetes in his mother; Hypertension in his father; Kidney disease in his mother; Migraines in his father; Sleep disorder in his father and mother; Stroke in his mother. ROS:   Please see the history of present illness.    All 14 point review of systems negative except as described per history of present illness  EKGs/Labs/Other Studies Reviewed:      Recent Labs: No results found for requested labs within last 8760 hours.  Recent Lipid Panel No results found for: CHOL, TRIG, HDL, CHOLHDL, VLDL, LDLCALC, LDLDIRECT  Physical Exam:    VS:  BP 134/62   Pulse (!) 48   Ht 5\' 6"  (1.676 m)   Wt 173 lb 12.8 oz (78.8 kg)   SpO2 97%   BMI 28.05 kg/m     Wt Readings from Last 3 Encounters:  06/14/17 173  lb 12.8 oz (78.8 kg)  05/02/17 175 lb (79.4 kg)     GEN:  Well nourished, well developed in no acute distress HEENT: Normal NECK: No JVD; No carotid bruits LYMPHATICS: No lymphadenopathy CARDIAC: RRR, no murmurs, no rubs, no gallops RESPIRATORY:  Clear to auscultation without rales, wheezing or rhonchi  ABDOMEN: Soft, non-tender, non-distended MUSCULOSKELETAL:  No edema; No deformity  SKIN: Warm and dry LOWER EXTREMITIES: no swelling  NEUROLOGIC:  Alert and oriented x 3 PSYCHIATRIC:  Normal affect   ASSESSMENT:    1. Nonrheumatic aortic valve stenosis   2. Coronary artery disease involving native coronary artery of native heart without angina pectoris   3. Essential hypertension   4. Status post coronary artery bypass graft    PLAN:    In order of problems listed above:  1. Nonrheumatic aortic valve stenosis: The most moderate.  I cannot justify his symptoms. 2. Coronary artery disease: Stable we will continue present management. 3. Essential hypertension: Blood pressure well controlled we will continue 4. Status post coronary artery bypass graft: Stable  Dizziness: I will refer him to neurology since myocardial G workup so far has been negative in finding a reason for his symptoms.  See him back in my office in about 5 months.   Medication Adjustments/Labs and Tests Ordered: Current medicines are reviewed at length with the patient today.  Concerns regarding medicines are outlined above.  No orders of the defined types were placed in this encounter.  Medication changes: No orders of the defined types were placed in this encounter.   Signed, Georgeanna Lea, MD, [facility] 06/14/2017 11:38 AM    [facility]

## 2017-06-14 NOTE — Patient Instructions (Signed)
Medication Instructions:  Your physician recommends that you continue on your current medications as directed. Please refer to the Current Medication list given to you today.   Labwork: None  Testing/Procedures: None  Follow-Up: Your physician wants you to follow-up in: 5 months. You will receive a reminder letter in the mail two months in advance. If you don't receive a letter, please call our office to schedule the follow-up appointment.   Any Other Special Instructions Will Be Listed Below (If Applicable).  You have been referred to neurology. You will be contacted by [facility] to schedule an appointment.       If you need a refill on your cardiac medications before your next appointment, please call your pharmacy.

## 2017-06-17 ENCOUNTER — Encounter: Payer: Self-pay | Admitting: Neurology

## 2017-06-20 DIAGNOSIS — J180 Bronchopneumonia, unspecified organism: Secondary | ICD-10-CM | POA: Diagnosis not present

## 2017-06-20 DIAGNOSIS — J301 Allergic rhinitis due to pollen: Secondary | ICD-10-CM | POA: Diagnosis not present

## 2017-07-07 DIAGNOSIS — Z951 Presence of aortocoronary bypass graft: Secondary | ICD-10-CM | POA: Diagnosis not present

## 2017-07-07 DIAGNOSIS — I4510 Unspecified right bundle-branch block: Secondary | ICD-10-CM | POA: Diagnosis not present

## 2017-07-07 DIAGNOSIS — R0602 Shortness of breath: Secondary | ICD-10-CM | POA: Diagnosis not present

## 2017-07-07 DIAGNOSIS — I10 Essential (primary) hypertension: Secondary | ICD-10-CM | POA: Diagnosis not present

## 2017-07-07 DIAGNOSIS — I700 Atherosclerosis of aorta: Secondary | ICD-10-CM | POA: Diagnosis not present

## 2017-07-07 DIAGNOSIS — Z87891 Personal history of nicotine dependence: Secondary | ICD-10-CM | POA: Diagnosis not present

## 2017-07-08 DIAGNOSIS — I4510 Unspecified right bundle-branch block: Secondary | ICD-10-CM | POA: Diagnosis not present

## 2017-07-10 DIAGNOSIS — M4807 Spinal stenosis, lumbosacral region: Secondary | ICD-10-CM | POA: Diagnosis not present

## 2017-07-10 DIAGNOSIS — R0602 Shortness of breath: Secondary | ICD-10-CM | POA: Diagnosis not present

## 2017-08-23 ENCOUNTER — Ambulatory Visit: Payer: Medicare Other | Admitting: Neurology

## 2017-08-23 ENCOUNTER — Encounter

## 2017-08-23 ENCOUNTER — Encounter: Payer: Self-pay | Admitting: Neurology

## 2017-08-23 ENCOUNTER — Other Ambulatory Visit: Payer: Self-pay

## 2017-08-23 VITALS — BP 180/92 | HR 71 | Ht 66.0 in | Wt 168.0 lb

## 2017-08-23 DIAGNOSIS — G629 Polyneuropathy, unspecified: Secondary | ICD-10-CM

## 2017-08-23 DIAGNOSIS — G250 Essential tremor: Secondary | ICD-10-CM | POA: Diagnosis not present

## 2017-08-23 DIAGNOSIS — M21372 Foot drop, left foot: Secondary | ICD-10-CM

## 2017-08-23 DIAGNOSIS — M21371 Foot drop, right foot: Secondary | ICD-10-CM | POA: Diagnosis not present

## 2017-08-23 MED ORDER — PRIMIDONE 50 MG PO TABS: ORAL_TABLET | ORAL | 6 refills | Status: DC

## 2017-08-23 NOTE — Patient Instructions (Signed)
1. Schedule EMG/NCV both LE, left UE  2. Refer for Balance Therapy in [place] 3. Start Primidone 50mg  twice a day to hopefully help with tremors in hands 4. Use cane/walker for balance 5. Follow-up in 6 months, call for any changes

## 2017-08-23 NOTE — Progress Notes (Signed)
NEUROLOGY CONSULTATION NOTE  Jack Hodges MRN: [id number] DOB: Sep 15, 1930  Referring provider: Dr. Gypsy Balsam Primary care provider: Dr. Brent Bulla  Reason for consult:  dizziness  Dear Dr Bing Matter:  Thank you for your kind referral of Jack Hodges for consultation of the above symptoms. Although his history is well known to you, please allow me to reiterate it for the purpose of our medical record. He is alone in the office today. Records and images were personally reviewed where available.  HISTORY OF PRESENT ILLNESS: This is a pleasant 84 year old right-handed man with a history of hypertension, hyperlipidemia, lumbar spinal stenosis, presenting for evaluation of dizziness. He reports symptoms started close to a year ago when his BP was very elevated ("went to 250"), at that time he may have had hallucinations "thought there was a giraffe next to me," he slipped off the bed and hit the back of his head, and reports issues since then. He feels that he cannot stand still, he wants to go forward but his body goes backward. He has occasional spinning sensation and gest lightheaded, but overall describes the dizziness as loss of balance. He is symptomatic only when standing, when he is sitting and supine, "nothing is wrong with me." When he stands, he has to hold on to things, staggering, and also reporting pain in his hips, "like hips are full of gravel." He has occasional dizziness when he gets ready for bed. He uses his cane to ambulate. He has left hand numbness and tingling, it feels weak sometimes. He also has a history of left foot drop with occasional numbness and tingling in his left foot. He recalls having an EMG in the past. His last fall was 4-5 months ago. He denies any diplopia, dysarthria/dysphagia, headaches, bowel/bladder dysfunction. He has neck and back/hip pain. Sinemet is listed on his medications, but he denies taking it. On review of records, he was previously  seen by neurologist Dr. Hyacinth Meeker in [place] in 2017 and diagnosed with essential tremor and neuropathy. He was previously on Primidone, it is unclear when or who started Sinemet. He denies taking both medications.   PAST MEDICAL HISTORY: Past Medical History:  Diagnosis Date  . Arthritis   . Bilateral foot-drop 01/18/2017  . Coronary artery disease involving native coronary artery of native heart without angina pectoris 11/01/2015   Overview:  Bypass surgery in 1996 Overview:  Overview:  Bypass surgery in 1996  Last Assessment & Plan:  Continue his current regimen.  Follow up as noted.  . CVA (cerebral vascular accident) (HCC) 04/03/2016   Last Assessment & Plan:  The patient underwent extensive CVA workup.  MRI is negative.  He does have aortic stenosis by echocardiogram.  Upon review of Care everywhere he is followed by Dr. Bing Matter, cardiologist for this.  Carotid ultrasound negative for hemodynamically significant stenosis.  Lipid panel within normal limits.  He has not had any further episodes of dizziness or nausea vomiting.   . Dementia   . Depression   . Dizziness 04/19/2016  . DNR (do not resuscitate) 04/03/2016   Overview:  I had an extensive discussion with the patient on 04/03/2016 regarding his end of life wishes.  He and his daughter agree that do not resuscitate is in keeping with his beliefs.  . Dyslipidemia 11/01/2015   Last Assessment & Plan:  Formatting of this note might be different from the original. Cholesterol 136   Triglycerides  83  HDL  50.1  LDL  Calculated  69  VLDL Cholesterol Cal  17   Continue statin.  . Essential hypertension 11/01/2015   Last Assessment & Plan:  Resume home medications.  Follow-up as noted above.  . Hypertension   . Sleep apnea   . 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    PAST SURGICAL HISTORY: Past Surgical  History:  Procedure Laterality Date  . CARDIAC SURGERY      MEDICATIONS: Current Outpatient Medications on File Prior to Visit  Medication Sig Dispense Refill  . ALPRAZolam (XANAX) 0.5 MG tablet Take by mouth daily as needed.     Marland Kitchen amLODipine (NORVASC) 10 MG tablet Take 10 mg by mouth.    Marland Kitchen aspirin EC 81 MG tablet Take 81 mg by mouth daily.    . B Complex Vitamins (VITAMIN B COMPLEX PO) Take by mouth.    . carbidopa-levodopa (SINEMET IR) 25-100 MG tablet Take by mouth as needed.     . hydrochlorothiazide (HYDRODIURIL) 25 MG tablet Take 25 mg by mouth daily.    Marland Kitchen lisinopril (PRINIVIL,ZESTRIL) 10 MG tablet Take by mouth.    Marland Kitchen omeprazole (PRILOSEC) 20 MG capsule Take 20 mg by mouth.    . oxyCODONE-acetaminophen (PERCOCET/ROXICET) 5-325 MG tablet Take by mouth.    . rosuvastatin (CRESTOR) 20 MG tablet Take 20 mg by mouth daily.  4  . sertraline (ZOLOFT) 50 MG tablet 50 mg daily.    . tamsulosin (FLOMAX) 0.4 MG CAPS capsule as needed.     No current facility-administered medications on file prior to visit.     ALLERGIES: No Known Allergies  FAMILY HISTORY: Family History  Problem Relation Age of Onset  . Cancer Mother   . Diabetes Mother   . Kidney disease Mother   . Stroke Mother   . Sleep disorder Mother   . Arthritis Father   . Migraines Father   . Hypertension Father   . Sleep disorder Father   . Arthritis Brother   . Cancer Brother     SOCIAL HISTORY: Social History   Socioeconomic History  . Marital status: Married    Spouse name: Not on file  . Number of children: Not on file  . Years of education: Not on file  . Highest education level: Not on file  Occupational History  . Not on file  Social Needs  . Financial resource strain: Not on file  . Food insecurity:    Worry: Not on file    Inability: Not on file  . Transportation needs:    Medical: Not on file    Non-medical: Not on file  Tobacco Use  . Smoking status: Former Smoker    Last attempt to  quit: 05/02/1962    Years since quitting: 55.3  . Smokeless tobacco: Never Used  Substance and Sexual Activity  . Alcohol use: No    Frequency: Never  . Drug use: No  . Sexual activity: Not on file  Lifestyle  . Physical activity:    Days per week: Not on file    Minutes per session: Not on file  . Stress: Not on file  Relationships  . Social connections:    Talks on phone: Not on file    Gets together: Not on file    Attends religious service: Not on file    Active member of club or organization: Not on file  Attends meetings of clubs or organizations: Not on file    Relationship status: Not on file  . Intimate partner violence:    Fear of current or ex partner: Not on file    Emotionally abused: Not on file    Physically abused: Not on file    Forced sexual activity: Not on file  Other Topics Concern  . Not on file  Social History Narrative  . Not on file    REVIEW OF SYSTEMS: Constitutional: No fevers, chills, or sweats, no generalized fatigue, change in appetite Eyes: No visual changes, double vision, eye pain Ear, nose and throat: No hearing loss, ear pain, nasal congestion, sore throat Cardiovascular: No chest pain, palpitations Respiratory:  No shortness of breath at rest or with exertion, wheezes GastrointestinaI: No nausea, vomiting, diarrhea, abdominal pain, fecal incontinence Genitourinary:  No dysuria, urinary retention or frequency Musculoskeletal:  + neck pain, back pain Integumentary: No rash, pruritus, skin lesions Neurological: as above Psychiatric: No depression, insomnia, anxiety Endocrine: No palpitations, fatigue, diaphoresis, mood swings, change in appetite, change in weight, increased thirst Hematologic/Lymphatic:  No anemia, purpura, petechiae. Allergic/Immunologic: no itchy/runny eyes, nasal congestion, recent allergic reactions, rashes  PHYSICAL EXAM: Vitals:   08/23/17 1401  BP: (!) 180/92  Pulse: 71  SpO2: 96%   General: No acute  distress Head:  Normocephalic/atraumatic Eyes: Fundoscopic exam shows bilateral sharp discs, no vessel changes, exudates, or hemorrhages Neck: supple, no paraspinal tenderness, full range of motion Back: No paraspinal tenderness Heart: regular rate and rhythm Lungs: Clear to auscultation bilaterally. Vascular: No carotid bruits. Skin/Extremities: No rash, no edema Neurological Exam: Mental status: alert and oriented to person, place, and time, no dysarthria or aphasia, Fund of knowledge is appropriate.  Recent and remote memory are intact.  Attention and concentration are normal.    Able to name objects and repeat phrases. Cranial nerves: CN I: not tested CN II: pupils equal, round and reactive to light, visual fields intact, fundi unremarkable. CN III, IV, VI:  full range of motion, no nystagmus, no ptosis CN V: facial sensation intact CN VII: upper and lower face symmetric CN VIII: hearing intact to finger rub CN IX, X: gag intact, uvula midline CN XI: sternocleidomastoid and trapezius muscles intact CN XII: tongue midline Bulk & Tone: normal, no fasciculations, no cogwheeling Motor: 5/5 on both UE, 5/5 bilateral proximal LE, 0/5 left foot dorsiflexion/plantarflexion, eversion/inversion. Right foot 3/5 dorsiflexion, 0/5 EHL, 5/5 plantar flexion. No pronator drift. Sensation: decreased pin and cold on left hand, decreased pin to ankles bilaterally, decreased cold and vibration sense to knees bilaterally. Romberg test positive sway Deep Tendon Reflexes: +1 on both UE, unable to elicit on both LE, no ankle clonus Plantar responses: downgoing bilaterally Cerebellar: no incoordination on finger to nose testing Gait: steppage gait with left foot drop Tremor: no resting tremor, +bilateral high frequency low amplitude postural and action tremor  IMPRESSION: This is a pleasant 84 year old right-handed man with a history of  hypertension, hyperlipidemia, lumbar spinal stenosis, presenting for  evaluation of dizziness. He describes dizziness as loss of balance. Neurological exam shows evidence of a length-dependent neuropathy, which can cause gait imbalance. He also has a left foot drop, but mild right foot drop as well. EMG/NCV of both LE and left UE will be ordered to further evaluate his symptoms. He will be referred for Balance Therapy and was advised to use his cane/walker at all times. He has essential tremor and would like to resume Primidone previously  prescribed by his prior neurologist, start 50mg  BID, side effects were discussed. He will follow-up in 6 months and knows to call for any changes.   Thank you for allowing me to participate in the care of this patient. Please do not hesitate to call for any questions or concerns.   Patrcia Dolly, M.D.  CC: Dr. Bing Matter, Dr. Marina Goodell

## 2017-08-27 DIAGNOSIS — R5383 Other fatigue: Secondary | ICD-10-CM | POA: Diagnosis not present

## 2017-08-27 DIAGNOSIS — G603 Idiopathic progressive neuropathy: Secondary | ICD-10-CM | POA: Diagnosis not present

## 2017-08-27 DIAGNOSIS — M4716 Other spondylosis with myelopathy, lumbar region: Secondary | ICD-10-CM | POA: Diagnosis not present

## 2017-08-29 ENCOUNTER — Encounter: Payer: Medicare Other | Admitting: Neurology

## 2017-08-30 ENCOUNTER — Encounter: Payer: Self-pay | Admitting: Neurology

## 2017-09-03 ENCOUNTER — Encounter: Payer: Medicare Other | Admitting: Neurology

## 2017-09-13 DIAGNOSIS — K219 Gastro-esophageal reflux disease without esophagitis: Secondary | ICD-10-CM | POA: Diagnosis not present

## 2017-09-13 DIAGNOSIS — E782 Mixed hyperlipidemia: Secondary | ICD-10-CM | POA: Diagnosis not present

## 2017-09-13 DIAGNOSIS — M4716 Other spondylosis with myelopathy, lumbar region: Secondary | ICD-10-CM | POA: Diagnosis not present

## 2017-09-13 DIAGNOSIS — I2510 Atherosclerotic heart disease of native coronary artery without angina pectoris: Secondary | ICD-10-CM | POA: Diagnosis not present

## 2017-09-13 DIAGNOSIS — I10 Essential (primary) hypertension: Secondary | ICD-10-CM | POA: Diagnosis not present

## 2017-09-20 ENCOUNTER — Telehealth: Payer: Self-pay

## 2017-09-20 NOTE — Telephone Encounter (Signed)
Received notice from [facility] stating that they have been unable to contact pt to schedule PT.

## 2017-12-20 DIAGNOSIS — I10 Essential (primary) hypertension: Secondary | ICD-10-CM | POA: Diagnosis not present

## 2017-12-20 DIAGNOSIS — E782 Mixed hyperlipidemia: Secondary | ICD-10-CM | POA: Diagnosis not present

## 2017-12-20 DIAGNOSIS — G2581 Restless legs syndrome: Secondary | ICD-10-CM | POA: Diagnosis not present

## 2017-12-20 DIAGNOSIS — Z23 Encounter for immunization: Secondary | ICD-10-CM | POA: Diagnosis not present

## 2017-12-20 DIAGNOSIS — K219 Gastro-esophageal reflux disease without esophagitis: Secondary | ICD-10-CM | POA: Diagnosis not present

## 2017-12-26 DIAGNOSIS — M25512 Pain in left shoulder: Secondary | ICD-10-CM | POA: Diagnosis not present

## 2017-12-26 DIAGNOSIS — E440 Moderate protein-calorie malnutrition: Secondary | ICD-10-CM | POA: Diagnosis not present

## 2018-01-02 DIAGNOSIS — H43813 Vitreous degeneration, bilateral: Secondary | ICD-10-CM | POA: Diagnosis not present

## 2018-01-28 DIAGNOSIS — Z23 Encounter for immunization: Secondary | ICD-10-CM | POA: Diagnosis not present

## 2018-03-28 DIAGNOSIS — G2581 Restless legs syndrome: Secondary | ICD-10-CM | POA: Diagnosis not present

## 2018-03-28 DIAGNOSIS — G603 Idiopathic progressive neuropathy: Secondary | ICD-10-CM | POA: Diagnosis not present

## 2018-03-28 DIAGNOSIS — I2510 Atherosclerotic heart disease of native coronary artery without angina pectoris: Secondary | ICD-10-CM | POA: Diagnosis not present

## 2018-03-28 DIAGNOSIS — I10 Essential (primary) hypertension: Secondary | ICD-10-CM | POA: Diagnosis not present

## 2018-03-28 DIAGNOSIS — E782 Mixed hyperlipidemia: Secondary | ICD-10-CM | POA: Diagnosis not present

## 2018-04-04 ENCOUNTER — Ambulatory Visit: Payer: Medicare Other | Admitting: Neurology

## 2018-04-07 DIAGNOSIS — Z0000 Encounter for general adult medical examination without abnormal findings: Secondary | ICD-10-CM | POA: Diagnosis not present

## 2018-04-07 DIAGNOSIS — I10 Essential (primary) hypertension: Secondary | ICD-10-CM | POA: Diagnosis not present

## 2018-04-28 ENCOUNTER — Ambulatory Visit (INDEPENDENT_AMBULATORY_CARE_PROVIDER_SITE_OTHER): Payer: Medicare Other | Admitting: Cardiology

## 2018-04-28 ENCOUNTER — Encounter: Payer: Self-pay | Admitting: Cardiology

## 2018-04-28 VITALS — BP 178/72 | HR 74 | Ht 66.0 in | Wt 181.2 lb

## 2018-04-28 DIAGNOSIS — E785 Hyperlipidemia, unspecified: Secondary | ICD-10-CM

## 2018-04-28 DIAGNOSIS — I350 Nonrheumatic aortic (valve) stenosis: Secondary | ICD-10-CM | POA: Diagnosis not present

## 2018-04-28 DIAGNOSIS — Z951 Presence of aortocoronary bypass graft: Secondary | ICD-10-CM | POA: Diagnosis not present

## 2018-04-28 DIAGNOSIS — I10 Essential (primary) hypertension: Secondary | ICD-10-CM | POA: Diagnosis not present

## 2018-04-28 DIAGNOSIS — I2510 Atherosclerotic heart disease of native coronary artery without angina pectoris: Secondary | ICD-10-CM | POA: Diagnosis not present

## 2018-04-28 DIAGNOSIS — R42 Dizziness and giddiness: Secondary | ICD-10-CM

## 2018-04-28 LAB — BASIC METABOLIC PANEL
BUN/Creatinine Ratio: 16 (ref 10–24)
BUN: 14 mg/dL (ref 8–27)
CALCIUM: 9 mg/dL (ref 8.6–10.2)
CHLORIDE: 105 mmol/L (ref 96–106)
CO2: 20 mmol/L (ref 20–29)
Creatinine, Ser: 0.88 mg/dL (ref 0.76–1.27)
GFR calc Af Amer: 91 mL/min/{1.73_m2} (ref >59)
GFR calc non Af Amer: 78 mL/min/{1.73_m2} (ref >59)
Glucose: 130 mg/dL — ABNORMAL HIGH (ref 65–99)
Potassium: 3.9 mmol/L (ref 3.5–5.2)
Sodium: 140 mmol/L (ref 134–144)

## 2018-04-28 NOTE — Progress Notes (Signed)
Cardiology Office Note:    Date:  04/28/2018   ID:  Jack Hodges, Jack Hodges [birth year], MRN [id number]  PCP:  Abigail Miyamoto, MD  Cardiologist:  Gypsy Balsam, MD    Referring MD: Abigail Miyamoto,*   Chief Complaint  Patient presents with  . Follow-up  Still having some dizziness  History of Present Illness:    Jack Hodges is a 85 y.o. male with coronary artery disease, dyslipidemia, essential hypertension dizziness comes today to my office for follow-up still complaining of having some dizziness.  However it is not related to his heart.  He is being followed by neurology he does have some foot drops and wearing some devices to help with that.  He also was trying some medication overall seems to be doing fair but still complaining of having dizziness.  Denies having chest pain tightness squeezing pressure burning chest.  Another complaint is shortness of breath but then he tells me this is as before.  He does have aortic stenosis last echocardiogram 1 year ago showed moderate today it sounds slightly worse on the physical exam therefore I will ask him to have his echocardiogram repeated.  Also his blood pressure is high I will check his Chem-7 if Chem-7 is acceptable then will increase dose of Prinivil.  Past Medical History:  Diagnosis Date  . Arthritis   . Bilateral foot-drop 01/18/2017  . Coronary artery disease involving native coronary artery of native heart without angina pectoris 11/01/2015   Overview:  Bypass surgery in 1996 Overview:  Overview:  Bypass surgery in 1996  Last Assessment & Plan:  Continue his current regimen.  Follow up as noted.  . CVA (cerebral vascular accident) (HCC) 04/03/2016   Last Assessment & Plan:  The patient underwent extensive CVA workup.  MRI is negative.  He does have aortic stenosis by echocardiogram.  Upon review of Care everywhere he is followed by Dr. Bing Matter, cardiologist for this.  Carotid ultrasound negative for hemodynamically  significant stenosis.  Lipid panel within normal limits.  He has not had any further episodes of dizziness or nausea vomiting.   . Dementia (HCC)   . Depression   . Dizziness 04/19/2016  . DNR (do not resuscitate) 04/03/2016   Overview:  I had an extensive discussion with the patient on 04/03/2016 regarding his end of life wishes.  He and his daughter agree that do not resuscitate is in keeping with his beliefs.  . Dyslipidemia 11/01/2015   Last Assessment & Plan:  Formatting of this note might be different from the original. Cholesterol 136   Triglycerides  83  HDL  50.1  LDL Calculated  69  VLDL Cholesterol Cal  17   Continue statin.  . Essential hypertension 11/01/2015   Last Assessment & Plan:  Resume home medications.  Follow-up as noted above.  . Hypertension   . Neuropathy   . Sleep apnea   . 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    Past Surgical History:  Procedure Laterality Date  . CARDIAC SURGERY      Current Medications: No outpatient medications have been marked as taking for the 04/28/18 encounter (Office Visit) with Georgeanna Lea, MD.     Allergies:   Patient has no known allergies.   Social History   Socioeconomic History  . Marital status: Married    Spouse name: Not  on file  . Number of children: Not on file  . Years of education: Not on file  . Highest education level: Not on file  Occupational History  . Not on file  Social Needs  . Financial resource strain: Not on file  . Food insecurity:    Worry: Not on file    Inability: Not on file  . Transportation needs:    Medical: Not on file    Non-medical: Not on file  Tobacco Use  . Smoking status: Former Smoker    Last attempt to quit: 05/02/1962    Years since quitting: 56.0  . Smokeless tobacco: Never Used  Substance and Sexual Activity  . Alcohol use: No     Frequency: Never  . Drug use: No  . Sexual activity: Not on file  Lifestyle  . Physical activity:    Days per week: Not on file    Minutes per session: Not on file  . Stress: Not on file  Relationships  . Social connections:    Talks on phone: Not on file    Gets together: Not on file    Attends religious service: Not on file    Active member of club or organization: Not on file    Attends meetings of clubs or organizations: Not on file    Relationship status: Not on file  Other Topics Concern  . Not on file  Social History Narrative  . Not on file     Family History: The patient's family history includes Arthritis in his brother and father; Cancer in his brother and mother; Diabetes in his mother; Hypertension in his father; Kidney disease in his mother; Migraines in his father; Sleep disorder in his father and mother; Stroke in his mother. ROS:   Please see the history of present illness.    All 14 point review of systems negative except as described per history of present illness  EKGs/Labs/Other Studies Reviewed:      Recent Labs: No results found for requested labs within last 8760 hours.  Recent Lipid Panel No results found for: CHOL, TRIG, HDL, CHOLHDL, VLDL, LDLCALC, LDLDIRECT  Physical Exam:    VS:  BP (!) 178/72   Pulse 74   Ht 5\' 6"  (1.676 m)   Wt 181 lb 3.2 oz (82.2 kg)   SpO2 97%   BMI 29.25 kg/m     Wt Readings from Last 3 Encounters:  04/28/18 181 lb 3.2 oz (82.2 kg)  08/23/17 168 lb (76.2 kg)  06/14/17 173 lb 12.8 oz (78.8 kg)     GEN:  Well nourished, well developed in no acute distress HEENT: Normal NECK: No JVD; No carotid bruits LYMPHATICS: No lymphadenopathy CARDIAC: RRR, systolic ejection murmur grade 2/6 to 3/6 best heard at the right upper portion of the sternum, no rubs, no gallops RESPIRATORY:  Clear to auscultation without rales, wheezing or rhonchi  ABDOMEN: Soft, non-tender, non-distended MUSCULOSKELETAL:  No edema; No deformity   SKIN: Warm and dry LOWER EXTREMITIES: no swelling NEUROLOGIC:  Alert and oriented x 3 PSYCHIATRIC:  Normal affect   ASSESSMENT:    1. Essential hypertension   2. Coronary artery disease involving native coronary artery of native heart without angina pectoris   3. Nonrheumatic aortic valve stenosis   4. Status post coronary artery bypass graft   5. Dyslipidemia   6. Dizziness    PLAN:    In order of problems listed above:  1. Essential hypertension Chem-7 will be checked if Chem-7  is fine will increase dose of lisinopril.  I see him back in 6 weeks to follow on that. 2. Coronary artery disease stable on appropriate medication which I will continue. 3. Nonrheumatic aortic stenosis time to repeat his echocardiogram which we will do. 4. Status post coronary artery bypass graft doing well from that point review. 5. Dyslipidemia he is on Crestor 20 will continue. 6. Dizziness better noncardiac followed by neurology.   Medication Adjustments/Labs and Tests Ordered: Current medicines are reviewed at length with the patient today.  Concerns regarding medicines are outlined above.  No orders of the defined types were placed in this encounter.  Medication changes: No orders of the defined types were placed in this encounter.   Signed, Georgeanna Lea, MD, [facility] 04/28/2018 10:19 AM    [facility]

## 2018-04-28 NOTE — Patient Instructions (Signed)
Medication Instructions:  Your physician recommends that you continue on your current medications as directed. Please refer to the Current Medication list given to you today.  If you need a refill on your cardiac medications before your next appointment, please call your pharmacy.   Lab work: Your physician recommends that you return for lab work today: bmp   If you have labs (blood work) drawn today and your tests are completely normal, you will receive your results only by: Marland Kitchen MyChart Message (if you have MyChart) OR . A paper copy in the mail If you have any lab test that is abnormal or we need to change your treatment, we will call you to review the results.  Testing/Procedures: Your physician has requested that you have an echocardiogram. Echocardiography is a painless test that uses sound waves to create images of your heart. It provides your doctor with information about the size and shape of your heart and how well your heart's chambers and valves are working. This procedure takes approximately one hour. There are no restrictions for this procedure.    Follow-Up: At [facility], you and your health needs are our priority.  As part of our continuing mission to provide you with exceptional heart care, we have created designated Provider Care Teams.  These Care Teams include your primary Cardiologist (physician) and Advanced Practice Providers (APPs -  Physician Assistants and Nurse Practitioners) who all work together to provide you with the care you need, when you need it. You will need a follow up appointment in 6 weeks.  Please call our office 2 months in advance to schedule this appointment.  You may see No primary care provider on file. or another member of our [employer] Provider Team in [place]: Norman Herrlich, MD . Belva Crome, MD  Any Other Special Instructions Will Be Listed Below (If Applicable).   Echocardiogram An echocardiogram is a procedure that uses painless  sound waves (ultrasound) to produce an image of the heart. Images from an echocardiogram can provide important information about:  Signs of coronary artery disease (CAD).  Aneurysm detection. An aneurysm is a weak or damaged part of an artery wall that bulges out from the normal force of blood pumping through the body.  Heart size and shape. Changes in the size or shape of the heart can be associated with certain conditions, including heart failure, aneurysm, and CAD.  Heart muscle function.  Heart valve function.  Signs of a past heart attack.  Fluid buildup around the heart.  Thickening of the heart muscle.  A tumor or infectious growth around the heart valves. Tell a health care provider about:  Any allergies you have.  All medicines you are taking, including vitamins, herbs, eye drops, creams, and over-the-counter medicines.  Any blood disorders you have.  Any surgeries you have had.  Any medical conditions you have.  Whether you are pregnant or may be pregnant. What are the risks? Generally, this is a safe procedure. However, problems may occur, including:  Allergic reaction to dye (contrast) that may be used during the procedure. What happens before the procedure? No specific preparation is needed. You may eat and drink normally. What happens during the procedure?   An IV tube may be inserted into one of your veins.  You may receive contrast through this tube. A contrast is an injection that improves the quality of the pictures from your heart.  A gel will be applied to your chest.  A wand-like tool (transducer)  will be moved over your chest. The gel will help to transmit the sound waves from the transducer.  The sound waves will harmlessly bounce off of your heart to allow the heart images to be captured in real-time motion. The images will be recorded on a computer. The procedure may vary among health care providers and hospitals. What happens after the  procedure?  You may return to your normal, everyday life, including diet, activities, and medicines, unless your health care provider tells you not to do that. Summary  An echocardiogram is a procedure that uses painless sound waves (ultrasound) to produce an image of the heart.  Images from an echocardiogram can provide important information about the size and shape of your heart, heart muscle function, heart valve function, and fluid buildup around your heart.  You do not need to do anything to prepare before this procedure. You may eat and drink normally.  After the echocardiogram is completed, you may return to your normal, everyday life, unless your health care provider tells you not to do that. This information is not intended to replace advice given to you by your health care provider. Make sure you discuss any questions you have with your health care provider. Document Released: 03/09/2000 Document Revised: 04/14/2016 Document Reviewed: 04/14/2016 Elsevier Interactive Patient Education  2019 [employer].

## 2018-05-01 ENCOUNTER — Telehealth: Payer: Self-pay | Admitting: Emergency Medicine

## 2018-05-01 MED ORDER — LISINOPRIL 10 MG PO TABS: 20.0000 mg | ORAL_TABLET | Freq: Every day | ORAL | 1 refills | Status: DC

## 2018-05-01 NOTE — Telephone Encounter (Signed)
Patient informed of lab results and to incresae lisinopril to 20 mg daily per Dr. Bing Matter. Patient verbally understands.

## 2018-05-06 DIAGNOSIS — H1132 Conjunctival hemorrhage, left eye: Secondary | ICD-10-CM | POA: Diagnosis not present

## 2018-05-09 ENCOUNTER — Ambulatory Visit (INDEPENDENT_AMBULATORY_CARE_PROVIDER_SITE_OTHER): Payer: Medicare Other

## 2018-05-09 DIAGNOSIS — I2510 Atherosclerotic heart disease of native coronary artery without angina pectoris: Secondary | ICD-10-CM | POA: Diagnosis not present

## 2018-05-09 DIAGNOSIS — I10 Essential (primary) hypertension: Secondary | ICD-10-CM

## 2018-05-09 DIAGNOSIS — I350 Nonrheumatic aortic (valve) stenosis: Secondary | ICD-10-CM

## 2018-05-09 NOTE — Progress Notes (Signed)
Complete echocardiogram has been performed.  Jimmy Lillee Mooneyhan RDCS, RVT 

## 2018-05-12 ENCOUNTER — Other Ambulatory Visit: Payer: Medicare Other

## 2018-05-15 DIAGNOSIS — J0100 Acute maxillary sinusitis, unspecified: Secondary | ICD-10-CM | POA: Diagnosis not present

## 2018-05-28 DIAGNOSIS — G603 Idiopathic progressive neuropathy: Secondary | ICD-10-CM | POA: Diagnosis not present

## 2018-05-28 DIAGNOSIS — J0100 Acute maxillary sinusitis, unspecified: Secondary | ICD-10-CM | POA: Diagnosis not present

## 2018-05-28 DIAGNOSIS — I10 Essential (primary) hypertension: Secondary | ICD-10-CM | POA: Diagnosis not present

## 2018-06-03 DIAGNOSIS — R05 Cough: Secondary | ICD-10-CM | POA: Diagnosis not present

## 2018-06-03 DIAGNOSIS — I10 Essential (primary) hypertension: Secondary | ICD-10-CM | POA: Diagnosis not present

## 2018-06-04 DIAGNOSIS — J180 Bronchopneumonia, unspecified organism: Secondary | ICD-10-CM | POA: Diagnosis not present

## 2018-06-04 DIAGNOSIS — R05 Cough: Secondary | ICD-10-CM | POA: Diagnosis not present

## 2018-06-10 ENCOUNTER — Encounter: Payer: Self-pay | Admitting: Cardiology

## 2018-06-10 ENCOUNTER — Ambulatory Visit (INDEPENDENT_AMBULATORY_CARE_PROVIDER_SITE_OTHER): Payer: Medicare Other | Admitting: Cardiology

## 2018-06-10 ENCOUNTER — Other Ambulatory Visit: Payer: Self-pay

## 2018-06-10 VITALS — BP 122/74 | HR 69 | Ht 66.0 in | Wt 179.2 lb

## 2018-06-10 DIAGNOSIS — I350 Nonrheumatic aortic (valve) stenosis: Secondary | ICD-10-CM

## 2018-06-10 DIAGNOSIS — I2510 Atherosclerotic heart disease of native coronary artery without angina pectoris: Secondary | ICD-10-CM

## 2018-06-10 DIAGNOSIS — I10 Essential (primary) hypertension: Secondary | ICD-10-CM

## 2018-06-10 DIAGNOSIS — E785 Hyperlipidemia, unspecified: Secondary | ICD-10-CM

## 2018-06-10 DIAGNOSIS — Z951 Presence of aortocoronary bypass graft: Secondary | ICD-10-CM

## 2018-06-10 NOTE — Addendum Note (Signed)
Addended by: Lita Mains on: 06/10/2018 09:39 AM   Modules accepted: Orders

## 2018-06-10 NOTE — Progress Notes (Signed)
Cardiology Office Note:    Date:  06/10/2018   ID:  Jack Hodges, DOB 03/15/1931, MRN [id number]  PCP:  Abigail Miyamoto, MD  Cardiologist:  Gypsy Balsam, MD    Referring MD: Abigail Miyamoto,*   Chief Complaint  Patient presents with   6 week follow up  Doing fine  History of Present Illness:    Jack Hodges is a 85 y.o. male with coronary artery disease hypertension aortic stenosis comes today to office in follow-up overall seems to be doing well he did have some cold-like symptoms treated improved denies having any symptoms now.  The biggest complaint that he is having is some neuropathy in his arm also unsteady gait which is a chronic problem.  He does have aortic stenosis echocardiogram has been repeated which showed only moderate aortic stenosis.  We will continue monitoring.  No need to intervene.  Past Medical History:  Diagnosis Date   Arthritis    Bilateral foot-drop 01/18/2017   Coronary artery disease involving native coronary artery of native heart without angina pectoris 11/01/2015   Overview:  Bypass surgery in 1996 Overview:  Overview:  Bypass surgery in 1996  Last Assessment & Plan:  Continue his current regimen.  Follow up as noted.   CVA (cerebral vascular accident) (HCC) 04/03/2016   Last Assessment & Plan:  The patient underwent extensive CVA workup.  MRI is negative.  He does have aortic stenosis by echocardiogram.  Upon review of Care everywhere he is followed by Dr. Bing Matter, cardiologist for this.  Carotid ultrasound negative for hemodynamically significant stenosis.  Lipid panel within normal limits.  He has not had any further episodes of dizziness or nausea vomiting.    Dementia (HCC)    Depression    Dizziness 04/19/2016   DNR (do not resuscitate) 04/03/2016   Overview:  I had an extensive discussion with the patient on 04/03/2016 regarding his end of life wishes.  He and his daughter agree that do not resuscitate is in keeping with  his beliefs.   Dyslipidemia 11/01/2015   Last Assessment & Plan:  Formatting of this note might be different from the original. Cholesterol 136   Triglycerides  83  HDL  50.1  LDL Calculated  69  VLDL Cholesterol Cal  17   Continue statin.   Essential hypertension 11/01/2015   Last Assessment & Plan:  Resume home medications.  Follow-up as noted above.   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    Past Surgical History:  Procedure Laterality Date   CARDIAC SURGERY      Current Medications: Current Meds  Medication Sig   amLODipine (NORVASC) 10 MG tablet Take 10 mg by mouth.   aspirin EC 81 MG tablet Take 81 mg by mouth daily.   B Complex Vitamins (VITAMIN B COMPLEX PO) Take by mouth.   carbidopa-levodopa (SINEMET IR) 25-100 MG tablet Take by mouth as needed.    gabapentin (NEURONTIN) 300 MG capsule Take 2 capsules by mouth 2 (two) times daily.   hydrochlorothiazide (HYDRODIURIL) 25 MG tablet Take 25 mg by mouth daily.   lisinopril (PRINIVIL,ZESTRIL) 10 MG tablet Take 2 tablets (20 mg total) by mouth daily.   omeprazole (PRILOSEC) 20 MG capsule Take 20 mg by mouth.   primidone (MYSOLINE) 50 MG  tablet Take 1 tablet twice a day   rosuvastatin (CRESTOR) 20 MG tablet Take 20 mg by mouth daily.   sertraline (ZOLOFT) 50 MG tablet 50 mg daily.   tamsulosin (FLOMAX) 0.4 MG CAPS capsule as needed.     Allergies:   Patient has no known allergies.   Social History   Socioeconomic History   Marital status: Married    Spouse name: Not on file   Number of children: Not on file   Years of education: Not on file   Highest education level: Not on file  Occupational History   Not on file  Social Needs   Financial resource strain: Not on file   Food insecurity:    Worry: Not on file     Inability: Not on file   Transportation needs:    Medical: Not on file    Non-medical: Not on file  Tobacco Use   Smoking status: Former Smoker    Last attempt to quit: 05/02/1962    Years since quitting: 56.1   Smokeless tobacco: Never Used  Substance and Sexual Activity   Alcohol use: No    Frequency: Never   Drug use: No   Sexual activity: Not on file  Lifestyle   Physical activity:    Days per week: Not on file    Minutes per session: Not on file   Stress: Not on file  Relationships   Social connections:    Talks on phone: Not on file    Gets together: Not on file    Attends religious service: Not on file    Active member of club or organization: Not on file    Attends meetings of clubs or organizations: Not on file    Relationship status: Not on file  Other Topics Concern   Not on file  Social History Narrative   Not on file     Family History: The patient's family history includes Arthritis in his brother and father; Cancer in his brother and mother; Diabetes in his mother; Hypertension in his father; Kidney disease in his mother; Migraines in his father; Sleep disorder in his father and mother; Stroke in his mother. ROS:   Please see the history of present illness.    All 14 point review of systems negative except as described per history of present illness  EKGs/Labs/Other Studies Reviewed:      Recent Labs: 04/28/2018: BUN 14; Creatinine, Ser 0.88; Potassium 3.9; Sodium 140  Recent Lipid Panel No results found for: CHOL, TRIG, HDL, CHOLHDL, VLDL, LDLCALC, LDLDIRECT  Physical Exam:    VS:  BP 122/74    Pulse 69    Ht 5\' 6"  (1.676 m)    Wt 179 lb 3.2 oz (81.3 kg)    SpO2 98%    BMI 28.92 kg/m     Wt Readings from Last 3 Encounters:  06/10/18 179 lb 3.2 oz (81.3 kg)  04/28/18 181 lb 3.2 oz (82.2 kg)  08/23/17 168 lb (76.2 kg)     GEN:  Well nourished, well developed in no acute distress HEENT: Normal NECK: No JVD; carotic bruit on the  left LYMPHATICS: No lymphadenopathy CARDIAC: RRR, systolic ejection murmur grade 2/6 best heard right upper portion of the sternum with radiation towards the neck, no rubs, no gallops RESPIRATORY:  Clear to auscultation without rales, wheezing or rhonchi  ABDOMEN: Soft, non-tender, non-distended MUSCULOSKELETAL:  No edema; No deformity  SKIN: Warm and dry LOWER EXTREMITIES: no swelling NEUROLOGIC:  Alert and oriented  x 3 PSYCHIATRIC:  Normal affect   ASSESSMENT:    1. Coronary artery disease involving native coronary artery of native heart without angina pectoris   2. Essential hypertension   3. Nonrheumatic aortic valve stenosis   4. Status post coronary artery bypass graft   5. Dyslipidemia    PLAN:    In order of problems listed above:  1. Coronary artery disease stable on appropriate medications which I will continue. 2. Essential hypertension blood pressure still elevated but his primary care physician added some new medication and today blood pressure is 122/70 in my office therefore we will continue present management. 3. Nonrheumatic aortic stenosis noncritical however he does have quite loud bruit on the left side of his neck I will ask him to have carotid ultrasound. 4. Status post coronary artery bypass graft stable continue present medications. 5. Dyslipidemia he is on Crestor 20 which I will continue.   Medication Adjustments/Labs and Tests Ordered: Current medicines are reviewed at length with the patient today.  Concerns regarding medicines are outlined above.  No orders of the defined types were placed in this encounter.  Medication changes: No orders of the defined types were placed in this encounter.   Signed, Georgeanna Lea, MD, [facility] 06/10/2018 9:29 AM    [facility]

## 2018-06-10 NOTE — Patient Instructions (Signed)
Medication Instructions:  Your physician recommends that you continue on your current medications as directed. Please refer to the Current Medication list given to you today.  If you need a refill on your cardiac medications before your next appointment, please call your pharmacy.   Lab work: None.  If you have labs (blood work) drawn today and your tests are completely normal, you will receive your results only by: Marland Kitchen MyChart Message (if you have MyChart) OR . A paper copy in the mail If you have any lab test that is abnormal or we need to change your treatment, we will call you to review the results.  Testing/Procedures: Your physician has requested that you have a carotid duplex. This test is an ultrasound of the carotid arteries in your neck. It looks at blood flow through these arteries that supply the brain with blood. Allow one hour for this exam. There are no restrictions or special instructions.    Follow-Up: At [facility], you and your health needs are our priority.  As part of our continuing mission to provide you with exceptional heart care, we have created designated Provider Care Teams.  These Care Teams include your primary Cardiologist (physician) and Advanced Practice Providers (APPs -  Physician Assistants and Nurse Practitioners) who all work together to provide you with the care you need, when you need it. You will need a follow up appointment in 4 months.  Please call our office 2 months in advance to schedule this appointment.  You may see No primary care provider on file. or another member of our [employer] Provider Team in [place]: Norman Herrlich, MD . Belva Crome, MD  Any Other Special Instructions Will Be Listed Below (If Applicable).

## 2018-06-17 DIAGNOSIS — I10 Essential (primary) hypertension: Secondary | ICD-10-CM | POA: Diagnosis not present

## 2018-06-20 DIAGNOSIS — I10 Essential (primary) hypertension: Secondary | ICD-10-CM | POA: Diagnosis not present

## 2018-06-30 ENCOUNTER — Other Ambulatory Visit: Payer: Self-pay

## 2018-06-30 ENCOUNTER — Telehealth: Payer: Self-pay | Admitting: Cardiology

## 2018-06-30 ENCOUNTER — Telehealth (INDEPENDENT_AMBULATORY_CARE_PROVIDER_SITE_OTHER): Payer: Medicare Other | Admitting: Cardiology

## 2018-06-30 ENCOUNTER — Encounter: Payer: Self-pay | Admitting: Cardiology

## 2018-06-30 VITALS — BP 182/101 | HR 64 | Wt 180.0 lb

## 2018-06-30 DIAGNOSIS — R0609 Other forms of dyspnea: Secondary | ICD-10-CM | POA: Insufficient documentation

## 2018-06-30 DIAGNOSIS — Z951 Presence of aortocoronary bypass graft: Secondary | ICD-10-CM

## 2018-06-30 DIAGNOSIS — I350 Nonrheumatic aortic (valve) stenosis: Secondary | ICD-10-CM

## 2018-06-30 DIAGNOSIS — I10 Essential (primary) hypertension: Secondary | ICD-10-CM

## 2018-06-30 DIAGNOSIS — E785 Hyperlipidemia, unspecified: Secondary | ICD-10-CM

## 2018-06-30 DIAGNOSIS — R0600 Dyspnea, unspecified: Secondary | ICD-10-CM

## 2018-06-30 HISTORY — DX: Dyspnea, unspecified: R06.00

## 2018-06-30 HISTORY — DX: Other forms of dyspnea: R06.09

## 2018-06-30 MED ORDER — FUROSEMIDE 40 MG PO TABS: 40.0000 mg | ORAL_TABLET | Freq: Every day | ORAL | 3 refills | Status: DC

## 2018-06-30 MED ORDER — POTASSIUM CHLORIDE ER 10 MEQ PO TBCR: 10.0000 meq | EXTENDED_RELEASE_TABLET | Freq: Every day | ORAL | 1 refills | Status: DC

## 2018-06-30 NOTE — Telephone Encounter (Signed)
Patient states for the last 2-3 days "he can hardly catch his breath" and wants to be seen today by Dr Bing Matter  Please call patient to discuss

## 2018-06-30 NOTE — Patient Instructions (Signed)
Medication Instructions:  Your physician has recommended you make the following change in your medication:  Start: Furosemide 40 mg daily  Start: Potassium 10 meq daily   If you need a refill on your cardiac medications before your next appointment, please call your pharmacy.   Lab work: Your physician recommends that you return for lab work on Wednesday : bmp   If you have labs (blood work) drawn today and your tests are completely normal, you will receive your results only by: Marland Kitchen MyChart Message (if you have MyChart) OR . A paper copy in the mail If you have any lab test that is abnormal or we need to change your treatment, we will call you to review the results.  Testing/Procedures: None.   Follow-Up: At [facility], you and your health needs are our priority.  As part of our continuing mission to provide you with exceptional heart care, we have created designated Provider Care Teams.  These Care Teams include your primary Cardiologist (physician) and Advanced Practice Providers (APPs -  Physician Assistants and Nurse Practitioners) who all work together to provide you with the care you need, when you need it. You will need a follow up appointment in 2 days.  Please call our office 2 months in advance to schedule this appointment.  You may see No primary care provider on file. or another member of our [employer] Provider Team in [place]: Shirlee More, MD . Jyl Heinz, MD  Any Other Special Instructions Will Be Listed Below (If Applicable).  Furosemide tablets What is this medicine? FUROSEMIDE (fyoor [place] se mide) is a diuretic. It helps you make more urine and to lose salt and excess water from your body. This medicine is used to treat high blood pressure, and edema or swelling from heart, kidney, or liver disease. This medicine may be used for other purposes; ask your health care provider or pharmacist if you have questions. COMMON BRAND NAME(S): Active-Medicated Specimen  Kit, Delone, Diuscreen, Lasix, RX Specimen Collection Kit, Specimen Collection Kit, URINX Medicated Specimen Collection What should I tell my health care provider before I take this medicine? They need to know if you have any of these conditions: -abnormal blood electrolytes -diarrhea or vomiting -gout -heart disease -kidney disease, small amounts of urine, or difficulty passing urine -liver disease -thyroid disease -an unusual or allergic reaction to furosemide, sulfa drugs, other medicines, foods, dyes, or preservatives -pregnant or trying to get pregnant -breast-feeding How should I use this medicine? Take this medicine by mouth with a glass of water. Follow the directions on the prescription label. You may take this medicine with or without food. If it upsets your stomach, take it with food or milk. Do not take your medicine more often than directed. Remember that you will need to pass more urine after taking this medicine. Do not take your medicine at a time of day that will cause you problems. Do not take at bedtime. Talk to your pediatrician regarding the use of this medicine in children. While this drug may be prescribed for selected conditions, precautions do apply. Overdosage: If you think you have taken too much of this medicine contact a poison control center or emergency room at once. NOTE: This medicine is only for you. Do not share this medicine with others. What if I miss a dose? If you miss a dose, take it as soon as you can. If it is almost time for your next dose, take only that dose. Do not take  double or extra doses. What may interact with this medicine? -aspirin and aspirin-like medicines -certain antibiotics -chloral hydrate -cisplatin -cyclosporine -digoxin -diuretics -laxatives -lithium -medicines for blood pressure -medicines that relax muscles for surgery -methotrexate -NSAIDs, medicines for pain and inflammation like ibuprofen, naproxen, or  indomethacin -phenytoin -steroid medicines like prednisone or cortisone -sucralfate -thyroid hormones This list may not describe all possible interactions. Give your health care provider a list of all the medicines, herbs, non-prescription drugs, or dietary supplements you use. Also tell them if you smoke, drink alcohol, or use illegal drugs. Some items may interact with your medicine. What should I watch for while using this medicine? Visit your doctor or health care professional for regular checks on your progress. Check your blood pressure regularly. Ask your doctor or health care professional what your blood pressure should be, and when you should contact him or her. If you are a diabetic, check your blood sugar as directed. You may need to be on a special diet while taking this medicine. Check with your doctor. Also, ask how many glasses of fluid you need to drink a day. You must not get dehydrated. You may get drowsy or dizzy. Do not drive, use machinery, or do anything that needs mental alertness until you know how this drug affects you. Do not stand or sit up quickly, especially if you are an older patient. This reduces the risk of dizzy or fainting spells. Alcohol can make you more drowsy and dizzy. Avoid alcoholic drinks. This medicine can make you more sensitive to the sun. Keep out of the sun. If you cannot avoid being in the sun, wear protective clothing and use sunscreen. Do not use sun lamps or tanning beds/booths. What side effects may I notice from receiving this medicine? Side effects that you should report to your doctor or health care professional as soon as possible: -blood in urine or stools -dry mouth -fever or chills -hearing loss or ringing in the ears -irregular heartbeat -muscle pain or weakness, cramps -skin rash -stomach upset, pain, or nausea -tingling or numbness in the hands or feet -unusually weak or tired -vomiting or diarrhea -yellowing of the eyes or  skin Side effects that usually do not require medical attention (report to your doctor or health care professional if they continue or are bothersome): -headache -loss of appetite -unusual bleeding or bruising This list may not describe all possible side effects. Call your doctor for medical advice about side effects. You may report side effects to FDA at 1-800-FDA-1088. Where should I keep my medicine? Keep out of the reach of children. Store at room temperature between 15 and 30 degrees C (59 and 86 degrees F). Protect from light. Throw away any unused medicine after the expiration date. NOTE: This sheet is a summary. It may not cover all possible information. If you have questions about this medicine, talk to your doctor, pharmacist, or health care provider.  2019 Elsevier/Gold Standard (2014-06-02 13:49:50)

## 2018-06-30 NOTE — Telephone Encounter (Signed)
Patient complaining of shortness of breath x 3 days. He reports leg swelling that began yesterday. His blood pressure today before medications was 199/92 and after daily medications was 211/107. Dr. Bing Matter informed. Will follow up with patient after he advises.

## 2018-06-30 NOTE — Telephone Encounter (Signed)
Per Dr. Bing Matter patient will be scheduled as a televisit today. Patient informed and scheduled.

## 2018-06-30 NOTE — Progress Notes (Signed)
Virtual Visit via Telephone Note   This visit type was conducted due to national recommendations for restrictions regarding the COVID-19 Pandemic (e.g. social distancing) in an effort to limit this patient's exposure and mitigate transmission in our community.  Due to his co-morbid illnesses, this patient is at least at moderate risk for complications without adequate follow up.  This format is felt to be most appropriate for this patient at this time.  The patient did not have access to video technology/had technical difficulties with video requiring transitioning to audio format only (telephone).  All issues noted in this document were discussed and addressed.  No physical exam could be performed with this format.  Please refer to the patient's chart for his  consent to telehealth for [facility].  Evaluation Performed:  Follow-up visit  This visit type was conducted due to national recommendations for restrictions regarding the COVID-19 Pandemic (e.g. social distancing).  This format is felt to be most appropriate for this patient at this time.  All issues noted in this document were discussed and addressed.  No physical exam was performed (except for noted visual exam findings with Video Visits).  Please refer to the patient's chart (MyChart message for video visits and phone note for telephone visits) for the patient's consent to telehealth for [facility].  Date:  06/30/2018  ID: Jack Hodges, DOB 09-Jan-1931, MRN [id number]   Patient Location:  [address] [place] [address]   Provider location:   [facility] [place] [facility]  PCP:  Abigail Miyamoto, MD  Cardiologist:  Gypsy Balsam, MD     Chief Complaint: I have shortness of breath and swollen legs  History of Present Illness:    Jack Hodges is a 85 y.o. male  who presents via audio/video conferencing for a telehealth visit today.  Past medical history significant for coronary artery disease, hypertension,  dyslipidemia.  He calls today complaining of having swollen legs as well as some shortness of breath.  He also described paroxysmal nocturnal dyspnea.  There is no chest pain tightness squeezing pressure burning chest.  He did not change any eating habits he did not change any of his medications however he was not sure exactly what medication he takes.  I told him that if he feels really that he need to go to the emergency room.  He said he is doing well.  We decided to give him some diuretic I will send prescription for 40 mg of furosemide with 10 mEq of potassium.  I will bring him to the office on Wednesday to check Chem-7 as well as to look at him.  I want him one more time if this situation get worse he need to go to the emergency room.  Luckily he does not have any chest pain.   The patient does not have symptoms concerning for COVID-19 infection (fever, chills, cough, or new SHORTNESS OF BREATH).    Prior CV studies:   The following studies were reviewed today:       Past Medical History:  Diagnosis Date  . Arthritis   . Bilateral foot-drop 01/18/2017  . Coronary artery disease involving native coronary artery of native heart without angina pectoris 11/01/2015   Overview:  Bypass surgery in 1996 Overview:  Overview:  Bypass surgery in 1996  Last Assessment & Plan:  Continue his current regimen.  Follow up as noted.  . CVA (cerebral vascular accident) (HCC) 04/03/2016   Last Assessment & Plan:  The patient underwent extensive CVA workup.  MRI is negative.  He does have aortic stenosis by echocardiogram.  Upon review of Care everywhere he is followed by Dr. Bing Matter, cardiologist for this.  Carotid ultrasound negative for hemodynamically significant stenosis.  Lipid panel within normal limits.  He has not had any further episodes of dizziness or nausea vomiting.   . Dementia (HCC)   . Depression   . Dizziness 04/19/2016  . DNR (do not resuscitate) 04/03/2016   Overview:  I had an extensive  discussion with the patient on 04/03/2016 regarding his end of life wishes.  He and his daughter agree that do not resuscitate is in keeping with his beliefs.  . Dyslipidemia 11/01/2015   Last Assessment & Plan:  Formatting of this note might be different from the original. Cholesterol 136   Triglycerides  83  HDL  50.1  LDL Calculated  69  VLDL Cholesterol Cal  17   Continue statin.  . Essential hypertension 11/01/2015   Last Assessment & Plan:  Resume home medications.  Follow-up as noted above.  . Hypertension   . Neuropathy   . Sleep apnea   . 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    Past Surgical History:  Procedure Laterality Date  . CARDIAC SURGERY       Current Meds  Medication Sig  . aspirin EC 81 MG tablet Take 81 mg by mouth daily.      Family History: The patient's family history includes Arthritis in his brother and father; Cancer in his brother and mother; Diabetes in his mother; Hypertension in his father; Kidney disease in his mother; Migraines in his father; Sleep disorder in his father and mother; Stroke in his mother.   ROS:   Please see the history of present illness.     All other systems reviewed and are negative.   Labs/Other Tests and Data Reviewed:     Recent Labs: 04/28/2018: BUN 14; Creatinine, Ser 0.88; Potassium 3.9; Sodium 140  Recent Lipid Panel No results found for: CHOL, TRIG, HDL, CHOLHDL, VLDL, LDLCALC, LDLDIRECT    Exam:    Vital Signs:  BP (!) 182/101   Pulse 64   Wt 180 lb (81.6 kg)   BMI 29.05 kg/m     Wt Readings from Last 3 Encounters:  06/30/18 180 lb (81.6 kg)  06/10/18 179 lb 3.2 oz (81.3 kg)  04/28/18 181 lb 3.2 oz (82.2 kg)     Well nourished, well developed male in no acute distress. Alert awake currently x3 and actually quite tearful on the phone.  Diagnosis for this visit:    1. Status post coronary artery bypass graft   2. Dyspnea on exertion   3. Dyslipidemia   4. Nonrheumatic aortic valve stenosis   5. Essential hypertension      ASSESSMENT & PLAN:    1.  Status post coronary bypass graft stable from that point of view. 2.  Dyspnea on exertion.  I am worried he does have decompensated congestive heart failure.  We will try to give him small dose of diuretic see if that helps.  I will see him back in my office in 2 days.  In the meantime I want him that him to go to the emergency room if he starts feeling poorly.  We will check his Chem-7 in 2 days and I will  see him in 2 days. 3.  Dyslipidemia there is some confusion about medication he takes I will ask him to bring all his medication to my office on Wednesday when I see him. 4.  Nonrheumatic aortic valve stenosis.  Last echocardiogram showed only moderate stenosis.  We will continue monitoring. 5.  Essential hypertension he reports to have high blood pressure today hopefully it will be better with addition of diabetic.  COVID-19 Education: The signs and symptoms of COVID-19 were discussed with the patient and how to seek care for testing (follow up with PCP or arrange E-visit).  The importance of social distancing was discussed today.  Patient Risk:   After full review of this patients clinical status, I feel that they are at least moderate risk at this time.  Time:   Today, I have spent 16 minutes with the patient with telehealth technology discussing pt health issues. Visit was finished at 11:38 AM.  I spent 10 minutes reviewing his chart before this visit.    Medication Adjustments/Labs and Tests Ordered: Current medicines are reviewed at length with the patient today.  Concerns regarding medicines are outlined above.  No orders of the defined types were placed in this encounter.  Medication changes: No orders of the defined types were placed in this encounter.    Disposition: Follow-up visit in  2 days.  Chem-7 will be done at that time  Signed, Georgeanna Lea, MD, [facility] 06/30/2018 11:33 AM    [facility]

## 2018-06-30 NOTE — Telephone Encounter (Signed)
YOUR CARDIOLOGY TEAM HAS ARRANGED FOR AN E-VISIT FOR YOUR APPOINTMENT - PLEASE REVIEW IMPORTANT INFORMATION BELOW SEVERAL DAYS PRIOR TO YOUR APPOINTMENT  Due to the recent COVID-19 pandemic, we are transitioning in-person office visits to tele-medicine visits in an effort to decrease unnecessary exposure to our patients and staff. Medicare and most insurances are covering these visits without a copay needed. We also encourage you to sign up for MyChart if you have not already done so. You will need a smartphone if possible. For patients that do not have this, we can still complete the visit using a regular telephone but do prefer a smartphone to enable video when possible. You may have a close family member that lives with you that can help. If possible, we also ask that you have a blood pressure cuff and scale at home to measure your blood pressure, heart rate and weight prior to your scheduled appointment. Patients with clinical needs that need an in-person evaluation and testing will still be able to come to the office if absolutely necessary. If you have any questions, feel free to call our office.    IF YOU HAVE A SMARTPHONE, PLEASE DOWNLOAD THE WEBEX APP TO YOUR SMARTPHONE  - If Apple, go to App Store and type in WebEx in the search bar. Download [employer] Webex Meetings, the blue/Prue circle. The app is free but as with any other app download, your phone may require you to verify saved payment information or Apple password. You do NOT have to create a WebEx account.  - If Android, go to Google Play Store and type in WebEx in the search bar. Download [employer] Webex Meetings, the blue/Rumble circle. The app is free but as with any other app download, your phone may require you to verify saved payment information or Android password. You do NOT have to create a WebEx account.  It is very helpful to have this downloaded before your visit.    2-3 DAYS BEFORE YOUR APPOINTMENT  You will receive a  telephone call from one of our HeartCare team members - your caller ID may say "Unknown caller." If this is a video visit, we will confirm that you have been able to download the WebEx app. We will remind you check your blood pressure, heart rate and weight prior to your scheduled appointment. If you have an Apple Watch or Kardia, please upload any pertinent ECG strips the day before or morning of your appointment to MyChart. Our staff will also make sure you have reviewed the consent and agree to move forward with your scheduled tele-health visit.     THE DAY OF YOUR APPOINTMENT  Approximately 15 minutes prior to your scheduled appointment, you will receive a telephone call from one of HeartCare team - your caller ID may say "Unknown caller."  Our staff will confirm medications, vital signs for the day and any symptoms you may be experiencing. Please have this information available prior to the time of visit start. It may also be helpful for you to have a pad of paper and pen handy for any instructions given during your visit. They will also walk you through joining the WebEx smartphone meeting if this is a video visit.    CONSENT FOR TELE-HEALTH VISIT - PLEASE REVIEW  I hereby voluntarily request, consent and authorize [facility] and its employed or contracted physicians, physician assistants, nurse practitioners or other licensed health care professionals (the Practitioner), to provide me with telemedicine health care services (the "Services") as   deemed necessary by the treating Practitioner. I acknowledge and consent to receive the Services by the Practitioner via telemedicine. I understand that the telemedicine visit will involve communicating with the Practitioner through live audiovisual communication technology and the disclosure of certain medical information by electronic transmission. I acknowledge that I have been given the opportunity to request an in-person assessment or other available  alternative prior to the telemedicine visit and am voluntarily participating in the telemedicine visit.  I understand that I have the right to withhold or withdraw my consent to the use of telemedicine in the course of my care at any time, without affecting my right to future care or treatment, and that the Practitioner or I may terminate the telemedicine visit at any time. I understand that I have the right to inspect all information obtained and/or recorded in the course of the telemedicine visit and may receive copies of available information for a reasonable fee.  I understand that some of the potential risks of receiving the Services via telemedicine include:  . Delay or interruption in medical evaluation due to technological equipment failure or disruption; . Information transmitted may not be sufficient (e.g. poor resolution of images) to allow for appropriate medical decision making by the Practitioner; and/or  . In rare instances, security protocols could fail, causing a breach of personal health information.  Furthermore, I acknowledge that it is my responsibility to provide information about my medical history, conditions and care that is complete and accurate to the best of my ability. I acknowledge that Practitioner's advice, recommendations, and/or decision may be based on factors not within their control, such as incomplete or inaccurate data provided by me or distortions of diagnostic images or specimens that may result from electronic transmissions. I understand that the practice of medicine is not an exact science and that Practitioner makes no warranties or guarantees regarding treatment outcomes. I acknowledge that I will receive a copy of this consent concurrently upon execution via email to the email address I last provided but may also request a printed copy by calling the office of [facility].    I understand that my insurance will be billed for this visit.   I have read or had  this consent read to me. . I understand the contents of this consent, which adequately explains the benefits and risks of the Services being provided via telemedicine.  . I have been provided ample opportunity to ask questions regarding this consent and the Services and have had my questions answered to my satisfaction. . I give my informed consent for the services to be provided through the use of telemedicine in my medical care  By participating in this telemedicine visit I agree to the above.   Patient agrees to consent.  

## 2018-07-02 ENCOUNTER — Other Ambulatory Visit: Payer: Self-pay

## 2018-07-02 ENCOUNTER — Ambulatory Visit (INDEPENDENT_AMBULATORY_CARE_PROVIDER_SITE_OTHER): Payer: Medicare Other | Admitting: Cardiology

## 2018-07-02 ENCOUNTER — Encounter: Payer: Self-pay | Admitting: Cardiology

## 2018-07-02 VITALS — BP 138/68 | HR 70 | Ht 66.0 in | Wt 177.0 lb

## 2018-07-02 DIAGNOSIS — I10 Essential (primary) hypertension: Secondary | ICD-10-CM | POA: Diagnosis not present

## 2018-07-02 DIAGNOSIS — R0609 Other forms of dyspnea: Secondary | ICD-10-CM | POA: Diagnosis not present

## 2018-07-02 DIAGNOSIS — Z951 Presence of aortocoronary bypass graft: Secondary | ICD-10-CM

## 2018-07-02 DIAGNOSIS — I350 Nonrheumatic aortic (valve) stenosis: Secondary | ICD-10-CM | POA: Diagnosis not present

## 2018-07-02 DIAGNOSIS — I2510 Atherosclerotic heart disease of native coronary artery without angina pectoris: Secondary | ICD-10-CM | POA: Diagnosis not present

## 2018-07-02 NOTE — Patient Instructions (Signed)
Medication Instructions:  Your physician recommends that you continue on your current medications as directed. Please refer to the Current Medication list given to you today.  If you need a refill on your cardiac medications before your next appointment, please call your pharmacy.   Lab work: Your physician recommends that you return for labwork  Today: BMP,BNP,CBC  If you have labs (blood work) drawn today and your tests are completely normal, you will receive your results only by: Marland Kitchen MyChart Message (if you have MyChart) OR . A paper copy in the mail If you have any lab test that is abnormal or we need to change your treatment, we will call you to review the results.  Testing/Procedures: None  Follow-Up: At [facility], you and your health needs are our priority.  As part of our continuing mission to provide you with exceptional heart care, we have created designated Provider Care Teams.  These Care Teams include your primary Cardiologist (physician) and Advanced Practice Providers (APPs -  Physician Assistants and Nurse Practitioners) who all work together to provide you with the care you need, when you need it. You will need a follow up appointment in 2 weeks.  Please call our office 2 months in advance to schedule this appointment.  You may see No primary care provider on file. or another member of our [employer] Provider Team in [place]: Norman Herrlich, MD . Belva Crome, MD  Any Other Special Instructions Will Be Listed Below (If Applicable).

## 2018-07-02 NOTE — Progress Notes (Signed)
Cardiology Office Note:    Date:  07/02/2018   ID:  Jack Hodges, DOB 1930/04/18, MRN [id number]  PCP:  Abigail Miyamoto, MD  Cardiologist:  Gypsy Balsam, MD    Referring MD: Abigail Miyamoto,*   Chief Complaint  Patient presents with   Follow-up  Am better  History of Present Illness:    Jack Hodges is a 85 y.o. male with coronary artery disease, we had tele-visits just few days ago because of his complaint of having swollen legs as well as shortness of breath especially when bending forward.  He also reported to have high blood pressure.  I gave him Lasix 40 mg with potassium he reports feeling better his legs are not swollen he said he can bend better he does have less shortness of breath and overall he seems to be comfortable.  He tells me that he is under a lot of stress before his of current COVID-19 situation.  Denies have any chest pain tightness squeezing pressure burning chest  Past Medical History:  Diagnosis Date   Arthritis    Bilateral foot-drop 01/18/2017   Coronary artery disease involving native coronary artery of native heart without angina pectoris 11/01/2015   Overview:  Bypass surgery in 1996 Overview:  Overview:  Bypass surgery in 1996  Last Assessment & Plan:  Continue his current regimen.  Follow up as noted.   CVA (cerebral vascular accident) (HCC) 04/03/2016   Last Assessment & Plan:  The patient underwent extensive CVA workup.  MRI is negative.  He does have aortic stenosis by echocardiogram.  Upon review of Care everywhere he is followed by Dr. Bing Matter, cardiologist for this.  Carotid ultrasound negative for hemodynamically significant stenosis.  Lipid panel within normal limits.  He has not had any further episodes of dizziness or nausea vomiting.    Dementia (HCC)    Depression    Dizziness 04/19/2016   DNR (do not resuscitate) 04/03/2016   Overview:  I had an extensive discussion with the patient on 04/03/2016 regarding his end of  life wishes.  He and his daughter agree that do not resuscitate is in keeping with his beliefs.   Dyslipidemia 11/01/2015   Last Assessment & Plan:  Formatting of this note might be different from the original. Cholesterol 136   Triglycerides  83  HDL  50.1  LDL Calculated  69  VLDL Cholesterol Cal  17   Continue statin.   Essential hypertension 11/01/2015   Last Assessment & Plan:  Resume home medications.  Follow-up as noted above.   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    Past Surgical History:  Procedure Laterality Date   CARDIAC SURGERY      Current Medications: Current Meds  Medication Sig   amLODipine (NORVASC) 10 MG tablet Take 10 mg by mouth.   aspirin EC 81 MG tablet Take 81 mg by mouth daily.   enalapril (VASOTEC) 20 MG tablet Take 20 mg by mouth daily.   furosemide (LASIX) 40 MG tablet Take 1 tablet (40 mg total) by mouth daily.   gabapentin (NEURONTIN) 300 MG capsule Take 2 capsules by mouth 2 (two) times daily.   omeprazole (PRILOSEC) 20 MG capsule Take 20 mg by mouth.   potassium chloride (K-DUR) 10 MEQ tablet Take 1  tablet (10 mEq total) by mouth daily.   primidone (MYSOLINE) 50 MG tablet Take 1 tablet twice a day   sertraline (ZOLOFT) 50 MG tablet 50 mg daily.   tamsulosin (FLOMAX) 0.4 MG CAPS capsule as needed.     Allergies:   Patient has no known allergies.   Social History   Socioeconomic History   Marital status: Married    Spouse name: Not on file   Number of children: Not on file   Years of education: Not on file   Highest education level: Not on file  Occupational History   Not on file  Social Needs   Financial resource strain: Not on file   Food insecurity:    Worry: Not on file    Inability: Not on file   Transportation needs:    Medical:  Not on file    Non-medical: Not on file  Tobacco Use   Smoking status: Former Smoker    Last attempt to quit: 05/02/1962    Years since quitting: 56.2   Smokeless tobacco: Never Used  Substance and Sexual Activity   Alcohol use: No    Frequency: Never   Drug use: No   Sexual activity: Not on file  Lifestyle   Physical activity:    Days per week: Not on file    Minutes per session: Not on file   Stress: Not on file  Relationships   Social connections:    Talks on phone: Not on file    Gets together: Not on file    Attends religious service: Not on file    Active member of club or organization: Not on file    Attends meetings of clubs or organizations: Not on file    Relationship status: Not on file  Other Topics Concern   Not on file  Social History Narrative   Not on file     Family History: The patient's family history includes Arthritis in his brother and father; Cancer in his brother and mother; Diabetes in his mother; Hypertension in his father; Kidney disease in his mother; Migraines in his father; Sleep disorder in his father and mother; Stroke in his mother. ROS:   Please see the history of present illness.    All 14 point review of systems negative except as described per history of present illness  EKGs/Labs/Other Studies Reviewed:      Recent Labs: 04/28/2018: BUN 14; Creatinine, Ser 0.88; Potassium 3.9; Sodium 140  Recent Lipid Panel No results found for: CHOL, TRIG, HDL, CHOLHDL, VLDL, LDLCALC, LDLDIRECT  Physical Exam:    VS:  BP 138/68    Pulse 70    Ht 5\' 6"  (1.676 m)    Wt 177 lb (80.3 kg)    SpO2 96%    BMI 28.57 kg/m     Wt Readings from Last 3 Encounters:  07/02/18 177 lb (80.3 kg)  06/30/18 180 lb (81.6 kg)  06/10/18 179 lb 3.2 oz (81.3 kg)     GEN:  Well nourished, well developed in no acute distress HEENT: Normal NECK: No JVD; No carotid bruits LYMPHATICS: No lymphadenopathy CARDIAC: RRR, systolic ejection murmur grade 2/6 to  3/6 best heard at the right upper portion of the sternum with radiation towards the neck, no rubs, no gallops RESPIRATORY:  Clear to auscultation without rales, wheezing or rhonchi  ABDOMEN: Soft, non-tender, non-distended MUSCULOSKELETAL:  No edema; No deformity  SKIN: Warm and dry LOWER EXTREMITIES: no swelling NEUROLOGIC:  Alert and oriented x 3  PSYCHIATRIC:  Normal affect   ASSESSMENT:    1. Coronary artery disease involving native coronary artery of native heart without angina pectoris   2. Dyspnea on exertion   3. Essential hypertension   4. Nonrheumatic aortic valve stenosis   5. Status post coronary artery bypass graft    PLAN:    In order of problems listed above:  1. Coronary artery disease stable denies having any symptoms. 2. Dyspnea on exertion.  I will schedule him to have Chem-7 as well as proBNP and CBC today.  He will continue the diuretic in the meantime.  Overall I think he is improving.  I will continue present medication if it safe.  Also another reason for blood work is to see what medication we can ask to get his blood pressure better under control. 3. Essential hypertension again Chem-7 is going to be done and then will decide about medications. 4. Aortic stenosis.  Noted last evaluation noncritical we will continue conservative approach 5. Status post coronary artery bypass graft doing well from that point review we will continue present management.   Medication Adjustments/Labs and Tests Ordered: Current medicines are reviewed at length with the patient today.  Concerns regarding medicines are outlined above.  Orders Placed This Encounter  Procedures   CBC   Basic Metabolic Panel (BMET)   Pro b natriuretic peptide   Medication changes: No orders of the defined types were placed in this encounter.   Signed, Georgeanna Lea, MD, [facility] 07/02/2018 9:28 AM    [facility]

## 2018-07-03 LAB — CBC
Hematocrit: 39.3 % (ref 37.5–51.0)
Hemoglobin: 13.5 g/dL (ref 13.0–17.7)
MCH: 30.8 pg (ref 26.6–33.0)
MCHC: 34.4 g/dL (ref 31.5–35.7)
MCV: 90 fL (ref 79–97)
Platelets: 95 10*3/uL — CL (ref 150–450)
RBC: 4.39 x10E6/uL (ref 4.14–5.80)
RDW: 15.1 % (ref 11.6–15.4)
WBC: 6.6 10*3/uL (ref 3.4–10.8)

## 2018-07-03 LAB — PRO B NATRIURETIC PEPTIDE: NT-Pro BNP: 660 pg/mL — ABNORMAL HIGH (ref 0–486)

## 2018-07-03 LAB — BASIC METABOLIC PANEL
BUN/Creatinine Ratio: 19 (ref 10–24)
BUN: 16 mg/dL (ref 8–27)
CO2: 21 mmol/L (ref 20–29)
Calcium: 9.1 mg/dL (ref 8.6–10.2)
Chloride: 107 mmol/L — ABNORMAL HIGH (ref 96–106)
Creatinine, Ser: 0.85 mg/dL (ref 0.76–1.27)
GFR calc Af Amer: 92 mL/min/{1.73_m2} (ref >59)
GFR calc non Af Amer: 79 mL/min/{1.73_m2} (ref >59)
Glucose: 165 mg/dL — ABNORMAL HIGH (ref 65–99)
Potassium: 3.7 mmol/L (ref 3.5–5.2)
Sodium: 143 mmol/L (ref 134–144)

## 2018-07-16 ENCOUNTER — Other Ambulatory Visit: Payer: Self-pay

## 2018-07-16 ENCOUNTER — Encounter: Payer: Self-pay | Admitting: Cardiology

## 2018-07-16 ENCOUNTER — Telehealth (INDEPENDENT_AMBULATORY_CARE_PROVIDER_SITE_OTHER): Payer: Medicare Other | Admitting: Cardiology

## 2018-07-16 VITALS — BP 168/88 | HR 72 | Wt 170.0 lb

## 2018-07-16 DIAGNOSIS — I10 Essential (primary) hypertension: Secondary | ICD-10-CM

## 2018-07-16 DIAGNOSIS — M48062 Spinal stenosis, lumbar region with neurogenic claudication: Secondary | ICD-10-CM

## 2018-07-16 DIAGNOSIS — I350 Nonrheumatic aortic (valve) stenosis: Secondary | ICD-10-CM

## 2018-07-16 DIAGNOSIS — I2510 Atherosclerotic heart disease of native coronary artery without angina pectoris: Secondary | ICD-10-CM | POA: Diagnosis not present

## 2018-07-16 DIAGNOSIS — R42 Dizziness and giddiness: Secondary | ICD-10-CM

## 2018-07-16 DIAGNOSIS — E785 Hyperlipidemia, unspecified: Secondary | ICD-10-CM

## 2018-07-16 NOTE — Progress Notes (Signed)
Virtual Visit via Telephone Note   This visit type was conducted due to national recommendations for restrictions regarding the COVID-19 Pandemic (e.g. social distancing) in an effort to limit this patient's exposure and mitigate transmission in our community.  Due to his co-morbid illnesses, this patient is at least at moderate risk for complications without adequate follow up.  This format is felt to be most appropriate for this patient at this time.  The patient did not have access to video technology/had technical difficulties with video requiring transitioning to audio format only (telephone).  All issues noted in this document were discussed and addressed.  No physical exam could be performed with this format.  Please refer to the patient's chart for his  consent to telehealth for [facility].  Evaluation Performed:  Follow-up visit  This visit type was conducted due to national recommendations for restrictions regarding the COVID-19 Pandemic (e.g. social distancing).  This format is felt to be most appropriate for this patient at this time.  All issues noted in this document were discussed and addressed.  No physical exam was performed (except for noted visual exam findings with Video Visits).  Please refer to the patient's chart (MyChart message for video visits and phone note for telephone visits) for the patient's consent to telehealth for [facility].  Date:  07/16/2018  ID: Jack Hodges, DOB 10-12-1930, MRN [id number]   Patient Location: [address] [place] [address]   Provider location:   [facility] [place] [facility]  PCP:  Abigail Miyamoto, MD  Cardiologist:  Gypsy Balsam, MD     Chief Complaint: Doing fine  History of Present Illness:    Jack Hodges is a 85 y.o. male  who presents via audio/video conferencing for a telehealth visit today.  With coronary artery disease, history of bypass surgery 1996.  Remote history of CVA.  Hypertension dyslipidemia  also recently started complaining of having worsening of swelling of lower extremities.  I brought him to the office 2 weeks ago I gave him a little more diuretic his laboratory test after that were acceptable with normal kidney function potassium being 3.6.  Since that time he said he is doing better he denies having any swelling of lower extremities there is no shortness of breath no chest pain no tightness no squeezing no pressure no burning in the chest.  Biggest complaint that he has just complaining about his neuropathy in lower extremities that would limit his ability to walk.   The patient does not have symptoms concerning for COVID-19 infection (fever, chills, cough, or new SHORTNESS OF BREATH).    Prior CV studies:   The following studies were reviewed today:       Past Medical History:  Diagnosis Date  . Arthritis   . Bilateral foot-drop 01/18/2017  . Coronary artery disease involving native coronary artery of native heart without angina pectoris 11/01/2015   Overview:  Bypass surgery in 1996 Overview:  Overview:  Bypass surgery in 1996  Last Assessment & Plan:  Continue his current regimen.  Follow up as noted.  . CVA (cerebral vascular accident) (HCC) 04/03/2016   Last Assessment & Plan:  The patient underwent extensive CVA workup.  MRI is negative.  He does have aortic stenosis by echocardiogram.  Upon review of Care everywhere he is followed by Dr. Bing Matter, cardiologist for this.  Carotid ultrasound negative for hemodynamically significant stenosis.  Lipid panel within normal limits.  He has not had any further  episodes of dizziness or nausea vomiting.   . Dementia (HCC)   . Depression   . Dizziness 04/19/2016  . DNR (do not resuscitate) 04/03/2016   Overview:  I had an extensive discussion with the patient on 04/03/2016 regarding his end of life wishes.  He and his daughter agree that do not resuscitate is in keeping with his beliefs.  . Dyslipidemia 11/01/2015   Last Assessment  & Plan:  Formatting of this note might be different from the original. Cholesterol 136   Triglycerides  83  HDL  50.1  LDL Calculated  69  VLDL Cholesterol Cal  17   Continue statin.  . Essential hypertension 11/01/2015   Last Assessment & Plan:  Resume home medications.  Follow-up as noted above.  . Hypertension   . Neuropathy   . Sleep apnea   . 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    Past Surgical History:  Procedure Laterality Date  . CARDIAC SURGERY       Current Meds  Medication Sig  . amLODipine (NORVASC) 10 MG tablet Take 10 mg by mouth.  Marland Kitchen aspirin EC 81 MG tablet Take 81 mg by mouth daily.  . enalapril (VASOTEC) 20 MG tablet Take 20 mg by mouth daily.  . furosemide (LASIX) 40 MG tablet Take 1 tablet (40 mg total) by mouth daily.  Marland Kitchen gabapentin (NEURONTIN) 300 MG capsule Take 2 capsules by mouth 2 (two) times daily.  Marland Kitchen omeprazole (PRILOSEC) 20 MG capsule Take 20 mg by mouth.  . potassium chloride (K-DUR) 10 MEQ tablet Take 1 tablet (10 mEq total) by mouth daily.  . primidone (MYSOLINE) 50 MG tablet Take 1 tablet twice a day  . sertraline (ZOLOFT) 50 MG tablet 50 mg daily.  . tamsulosin (FLOMAX) 0.4 MG CAPS capsule as needed.      Family History: The patient's family history includes Arthritis in his brother and father; Cancer in his brother and mother; Diabetes in his mother; Hypertension in his father; Kidney disease in his mother; Migraines in his father; Sleep disorder in his father and mother; Stroke in his mother.   ROS:   Please see the history of present illness.     All other systems reviewed and are negative.   Labs/Other Tests and Data Reviewed:     Recent Labs: 07/02/2018: BUN 16; Creatinine, Ser 0.85; Hemoglobin 13.5; NT-Pro BNP 660; Platelets 95; Potassium 3.7; Sodium 143  Recent Lipid Panel No results found for:  CHOL, TRIG, HDL, CHOLHDL, VLDL, LDLCALC, LDLDIRECT    Exam:    Vital Signs:  BP (!) 168/88   Pulse 72   Wt 170 lb (77.1 kg)   BMI 27.44 kg/m     Wt Readings from Last 3 Encounters:  07/16/18 170 lb (77.1 kg)  07/02/18 177 lb (80.3 kg)  06/30/18 180 lb (81.6 kg)     Well nourished, well developed in no acute distress. Alert awake oriented x3.  This is only audio conversation.  He has no technical ability to the video link in.  He appears to be in a good spirit.  Diagnosis for this visit:   1. Coronary artery disease involving native coronary artery of native heart without angina pectoris   2. Essential hypertension   3. Nonrheumatic aortic valve stenosis   4. Dyslipidemia   5. Dizziness   6. Spinal stenosis of lumbar region  with neurogenic claudication      ASSESSMENT & PLAN:    1.  Coronary artery disease.  Asymptomatic doing well from that point review we will continue present management. 2.  Essential hypertension apparently blood pressure elevated today asking to check blood pressure on a regular basis and then will talk to him again. 3.  Nonrheumatic aortic stenosis not critical.  We will continue present management. 4.  Dyslipidemia on statin which I will continue. 5.  Dizziness happens patient when he gets up very quickly he knows about it and he learned already that he need to get up slowly.  COVID-19 Education: The signs and symptoms of COVID-19 were discussed with the patient and how to seek care for testing (follow up with PCP or arrange E-visit).  The importance of social distancing was discussed today.  Patient Risk:   After full review of this patients clinical status, I feel that they are at least moderate risk at this time.  Time:   Today, I have spent 16 minutes with the patient with telehealth technology discussing pt health issues.  I spent 5 minutes reviewing her chart before the visit.  Visit was finished at 11:00 AM.    Medication  Adjustments/Labs and Tests Ordered: Current medicines are reviewed at length with the patient today.  Concerns regarding medicines are outlined above.  No orders of the defined types were placed in this encounter.  Medication changes: No orders of the defined types were placed in this encounter.    Disposition: Follow-up in 6 weeks  Signed, Georgeanna Leaobert J. Krasowski, MD, [facility] 07/16/2018 11:02 AM    [facility]

## 2018-07-16 NOTE — Patient Instructions (Signed)
Medication Instructions:  Your physician recommends that you continue on your current medications as directed. Please refer to the Current Medication list given to you today.  If you need a refill on your cardiac medications before your next appointment, please call your pharmacy.   Lab work: None.  If you have labs (blood work) drawn today and your tests are completely normal, you will receive your results only by: . MyChart Message (if you have MyChart) OR . A paper copy in the mail If you have any lab test that is abnormal or we need to change your treatment, we will call you to review the results.  Testing/Procedures: None.   Follow-Up: At [facility], you and your health needs are our priority.  As part of our continuing mission to provide you with exceptional heart care, we have created designated Provider Care Teams.  These Care Teams include your primary Cardiologist (physician) and Advanced Practice Providers (APPs -  Physician Assistants and Nurse Practitioners) who all work together to provide you with the care you need, when you need it. You will need a follow up appointment in 6 weeks.   Any Other Special Instructions Will Be Listed Below (If Applicable).     

## 2018-07-24 ENCOUNTER — Telehealth: Payer: Self-pay | Admitting: Cardiology

## 2018-07-24 DIAGNOSIS — Z1159 Encounter for screening for other viral diseases: Secondary | ICD-10-CM | POA: Diagnosis not present

## 2018-07-24 DIAGNOSIS — G5693 Unspecified mononeuropathy of bilateral upper limbs: Secondary | ICD-10-CM | POA: Diagnosis not present

## 2018-07-24 NOTE — Telephone Encounter (Signed)
[facility] [employer] is on the line to discuss patient's medicines. Transferring to [employer]

## 2018-07-24 NOTE — Telephone Encounter (Signed)
Received call from Marla at [facility] Lamar Sprinkles office wanting to clarify patients medications and dosages. Noted that patient is not taking enalapril 20 mg as ordered. He states he's not supposed to take it anymore. It is listed on his last office visit note as a current medication. BP at office is 133/78, HR 75 and weight is 170#.    Leia Alf also reports that patient is taking primidone 125 mg BID (total 250 mg/day) when our documentation shows 50 mg BID is prescribed. Primidone ordered by neurologist at [facility].     pls advise, tx

## 2018-07-25 ENCOUNTER — Telehealth: Payer: Self-pay

## 2018-07-25 NOTE — Telephone Encounter (Addendum)
Spoke with patient's son Eithan Gingery. Expressed concern that patient seems confused about his medications, which ones he's taking and the dosages. Informed [name] that patient had been taking incorrect doses and stopped 1 medication on his own.    Recommended that someone help patient take medications safely by getting a pill box and filling medications  as ordered in the box. [name] was agreeable and is eager to follow-up as suggested. He has no further questions or concerns.

## 2018-07-25 NOTE — Telephone Encounter (Signed)
Patient's son [name] is returning your call and would like a call back at [phone]. He goes to work at 2:00.

## 2018-07-25 NOTE — Telephone Encounter (Signed)
It is ok if he is not taking enalapril, so far BP looks ok, ef is normal so for now it is ok. I do not know about Primidone. I have a feeling that he can not keep up with his meds, so maybe we can call him and check how is he doing and what s he taking

## 2018-07-25 NOTE — Telephone Encounter (Signed)
Phoned patient to review and reconcile his meds with our list. Patient seems confused about medications, yesterday stated not taking enalapril, today states he is but reports not taking amlodipine. States his son helps him, asked patient to have son call me, will suggest helping patient with medications and using pill box to organize meds.

## 2018-08-08 ENCOUNTER — Other Ambulatory Visit: Payer: Self-pay

## 2018-08-08 DIAGNOSIS — E782 Mixed hyperlipidemia: Secondary | ICD-10-CM | POA: Diagnosis not present

## 2018-08-08 DIAGNOSIS — G5693 Unspecified mononeuropathy of bilateral upper limbs: Secondary | ICD-10-CM | POA: Diagnosis not present

## 2018-08-08 DIAGNOSIS — G2581 Restless legs syndrome: Secondary | ICD-10-CM | POA: Diagnosis not present

## 2018-08-08 DIAGNOSIS — I10 Essential (primary) hypertension: Secondary | ICD-10-CM | POA: Diagnosis not present

## 2018-08-08 NOTE — Patient Outreach (Signed)
[facility]) Care Management  08/08/2018  Mikko W Chilton Si Jan 16, 1931 [id number]   Medication Adherence call to Mr. Deny Scioli HIPPA Compliant Voice message left with a call back number. Mr. Urvina is showing past due on Rosuvastatin 20 mg under [employer].   Lillia Abed CPhT Pharmacy Technician [facility] Management Direct Dial [phone]  Fax [phone] [email or web address]

## 2018-08-14 DIAGNOSIS — G2581 Restless legs syndrome: Secondary | ICD-10-CM | POA: Diagnosis not present

## 2018-08-14 DIAGNOSIS — Z9181 History of falling: Secondary | ICD-10-CM | POA: Diagnosis not present

## 2018-08-14 DIAGNOSIS — Z951 Presence of aortocoronary bypass graft: Secondary | ICD-10-CM | POA: Diagnosis not present

## 2018-08-14 DIAGNOSIS — R296 Repeated falls: Secondary | ICD-10-CM | POA: Diagnosis not present

## 2018-08-14 DIAGNOSIS — M1991 Primary osteoarthritis, unspecified site: Secondary | ICD-10-CM | POA: Diagnosis not present

## 2018-08-14 DIAGNOSIS — G8929 Other chronic pain: Secondary | ICD-10-CM | POA: Diagnosis not present

## 2018-08-14 DIAGNOSIS — Z7982 Long term (current) use of aspirin: Secondary | ICD-10-CM | POA: Diagnosis not present

## 2018-08-14 DIAGNOSIS — I350 Nonrheumatic aortic (valve) stenosis: Secondary | ICD-10-CM | POA: Diagnosis not present

## 2018-08-14 DIAGNOSIS — E1140 Type 2 diabetes mellitus with diabetic neuropathy, unspecified: Secondary | ICD-10-CM | POA: Diagnosis not present

## 2018-08-14 DIAGNOSIS — K219 Gastro-esophageal reflux disease without esophagitis: Secondary | ICD-10-CM | POA: Diagnosis not present

## 2018-08-14 DIAGNOSIS — E782 Mixed hyperlipidemia: Secondary | ICD-10-CM | POA: Diagnosis not present

## 2018-08-14 DIAGNOSIS — I252 Old myocardial infarction: Secondary | ICD-10-CM | POA: Diagnosis not present

## 2018-08-14 DIAGNOSIS — I2510 Atherosclerotic heart disease of native coronary artery without angina pectoris: Secondary | ICD-10-CM | POA: Diagnosis not present

## 2018-08-14 DIAGNOSIS — I10 Essential (primary) hypertension: Secondary | ICD-10-CM | POA: Diagnosis not present

## 2018-08-14 DIAGNOSIS — M4807 Spinal stenosis, lumbosacral region: Secondary | ICD-10-CM | POA: Diagnosis not present

## 2018-08-14 DIAGNOSIS — D696 Thrombocytopenia, unspecified: Secondary | ICD-10-CM | POA: Diagnosis not present

## 2018-08-18 DIAGNOSIS — M1991 Primary osteoarthritis, unspecified site: Secondary | ICD-10-CM | POA: Diagnosis not present

## 2018-08-18 DIAGNOSIS — E782 Mixed hyperlipidemia: Secondary | ICD-10-CM | POA: Diagnosis not present

## 2018-08-18 DIAGNOSIS — I2510 Atherosclerotic heart disease of native coronary artery without angina pectoris: Secondary | ICD-10-CM | POA: Diagnosis not present

## 2018-08-18 DIAGNOSIS — E1140 Type 2 diabetes mellitus with diabetic neuropathy, unspecified: Secondary | ICD-10-CM | POA: Diagnosis not present

## 2018-08-18 DIAGNOSIS — G2581 Restless legs syndrome: Secondary | ICD-10-CM | POA: Diagnosis not present

## 2018-08-18 DIAGNOSIS — M4807 Spinal stenosis, lumbosacral region: Secondary | ICD-10-CM | POA: Diagnosis not present

## 2018-08-18 DIAGNOSIS — G8929 Other chronic pain: Secondary | ICD-10-CM | POA: Diagnosis not present

## 2018-08-18 DIAGNOSIS — D696 Thrombocytopenia, unspecified: Secondary | ICD-10-CM | POA: Diagnosis not present

## 2018-08-18 DIAGNOSIS — K219 Gastro-esophageal reflux disease without esophagitis: Secondary | ICD-10-CM | POA: Diagnosis not present

## 2018-08-18 DIAGNOSIS — Z9181 History of falling: Secondary | ICD-10-CM | POA: Diagnosis not present

## 2018-08-18 DIAGNOSIS — I350 Nonrheumatic aortic (valve) stenosis: Secondary | ICD-10-CM | POA: Diagnosis not present

## 2018-08-18 DIAGNOSIS — R296 Repeated falls: Secondary | ICD-10-CM | POA: Diagnosis not present

## 2018-08-18 DIAGNOSIS — Z951 Presence of aortocoronary bypass graft: Secondary | ICD-10-CM | POA: Diagnosis not present

## 2018-08-18 DIAGNOSIS — I252 Old myocardial infarction: Secondary | ICD-10-CM | POA: Diagnosis not present

## 2018-08-18 DIAGNOSIS — Z7982 Long term (current) use of aspirin: Secondary | ICD-10-CM | POA: Diagnosis not present

## 2018-08-18 DIAGNOSIS — I10 Essential (primary) hypertension: Secondary | ICD-10-CM | POA: Diagnosis not present

## 2018-08-20 DIAGNOSIS — R233 Spontaneous ecchymoses: Secondary | ICD-10-CM | POA: Diagnosis not present

## 2018-08-20 DIAGNOSIS — L720 Epidermal cyst: Secondary | ICD-10-CM | POA: Diagnosis not present

## 2018-08-21 DIAGNOSIS — G2581 Restless legs syndrome: Secondary | ICD-10-CM | POA: Diagnosis not present

## 2018-08-21 DIAGNOSIS — D696 Thrombocytopenia, unspecified: Secondary | ICD-10-CM | POA: Diagnosis not present

## 2018-08-21 DIAGNOSIS — I350 Nonrheumatic aortic (valve) stenosis: Secondary | ICD-10-CM | POA: Diagnosis not present

## 2018-08-21 DIAGNOSIS — Z9181 History of falling: Secondary | ICD-10-CM | POA: Diagnosis not present

## 2018-08-21 DIAGNOSIS — I2510 Atherosclerotic heart disease of native coronary artery without angina pectoris: Secondary | ICD-10-CM | POA: Diagnosis not present

## 2018-08-21 DIAGNOSIS — E782 Mixed hyperlipidemia: Secondary | ICD-10-CM | POA: Diagnosis not present

## 2018-08-21 DIAGNOSIS — Z951 Presence of aortocoronary bypass graft: Secondary | ICD-10-CM | POA: Diagnosis not present

## 2018-08-21 DIAGNOSIS — Z7982 Long term (current) use of aspirin: Secondary | ICD-10-CM | POA: Diagnosis not present

## 2018-08-21 DIAGNOSIS — I252 Old myocardial infarction: Secondary | ICD-10-CM | POA: Diagnosis not present

## 2018-08-21 DIAGNOSIS — K219 Gastro-esophageal reflux disease without esophagitis: Secondary | ICD-10-CM | POA: Diagnosis not present

## 2018-08-21 DIAGNOSIS — R296 Repeated falls: Secondary | ICD-10-CM | POA: Diagnosis not present

## 2018-08-21 DIAGNOSIS — M1991 Primary osteoarthritis, unspecified site: Secondary | ICD-10-CM | POA: Diagnosis not present

## 2018-08-21 DIAGNOSIS — I10 Essential (primary) hypertension: Secondary | ICD-10-CM | POA: Diagnosis not present

## 2018-08-21 DIAGNOSIS — M4807 Spinal stenosis, lumbosacral region: Secondary | ICD-10-CM | POA: Diagnosis not present

## 2018-08-21 DIAGNOSIS — G8929 Other chronic pain: Secondary | ICD-10-CM | POA: Diagnosis not present

## 2018-08-21 DIAGNOSIS — E1140 Type 2 diabetes mellitus with diabetic neuropathy, unspecified: Secondary | ICD-10-CM | POA: Diagnosis not present

## 2018-08-22 DIAGNOSIS — Z951 Presence of aortocoronary bypass graft: Secondary | ICD-10-CM | POA: Diagnosis not present

## 2018-08-22 DIAGNOSIS — M1991 Primary osteoarthritis, unspecified site: Secondary | ICD-10-CM | POA: Diagnosis not present

## 2018-08-22 DIAGNOSIS — Z7982 Long term (current) use of aspirin: Secondary | ICD-10-CM | POA: Diagnosis not present

## 2018-08-22 DIAGNOSIS — I10 Essential (primary) hypertension: Secondary | ICD-10-CM | POA: Diagnosis not present

## 2018-08-22 DIAGNOSIS — G2581 Restless legs syndrome: Secondary | ICD-10-CM | POA: Diagnosis not present

## 2018-08-22 DIAGNOSIS — G8929 Other chronic pain: Secondary | ICD-10-CM | POA: Diagnosis not present

## 2018-08-22 DIAGNOSIS — R296 Repeated falls: Secondary | ICD-10-CM | POA: Diagnosis not present

## 2018-08-22 DIAGNOSIS — I2510 Atherosclerotic heart disease of native coronary artery without angina pectoris: Secondary | ICD-10-CM | POA: Diagnosis not present

## 2018-08-22 DIAGNOSIS — M4807 Spinal stenosis, lumbosacral region: Secondary | ICD-10-CM | POA: Diagnosis not present

## 2018-08-22 DIAGNOSIS — I350 Nonrheumatic aortic (valve) stenosis: Secondary | ICD-10-CM | POA: Diagnosis not present

## 2018-08-22 DIAGNOSIS — I252 Old myocardial infarction: Secondary | ICD-10-CM | POA: Diagnosis not present

## 2018-08-22 DIAGNOSIS — E1140 Type 2 diabetes mellitus with diabetic neuropathy, unspecified: Secondary | ICD-10-CM | POA: Diagnosis not present

## 2018-08-22 DIAGNOSIS — K219 Gastro-esophageal reflux disease without esophagitis: Secondary | ICD-10-CM | POA: Diagnosis not present

## 2018-08-22 DIAGNOSIS — Z9181 History of falling: Secondary | ICD-10-CM | POA: Diagnosis not present

## 2018-08-22 DIAGNOSIS — D696 Thrombocytopenia, unspecified: Secondary | ICD-10-CM | POA: Diagnosis not present

## 2018-08-22 DIAGNOSIS — E782 Mixed hyperlipidemia: Secondary | ICD-10-CM | POA: Diagnosis not present

## 2018-08-25 DIAGNOSIS — M7022 Olecranon bursitis, left elbow: Secondary | ICD-10-CM | POA: Diagnosis not present

## 2018-08-25 DIAGNOSIS — Z7982 Long term (current) use of aspirin: Secondary | ICD-10-CM | POA: Diagnosis not present

## 2018-08-25 DIAGNOSIS — G2581 Restless legs syndrome: Secondary | ICD-10-CM | POA: Diagnosis not present

## 2018-08-25 DIAGNOSIS — Z9181 History of falling: Secondary | ICD-10-CM | POA: Diagnosis not present

## 2018-08-25 DIAGNOSIS — R296 Repeated falls: Secondary | ICD-10-CM | POA: Diagnosis not present

## 2018-08-25 DIAGNOSIS — G8929 Other chronic pain: Secondary | ICD-10-CM | POA: Diagnosis not present

## 2018-08-25 DIAGNOSIS — D696 Thrombocytopenia, unspecified: Secondary | ICD-10-CM | POA: Diagnosis not present

## 2018-08-25 DIAGNOSIS — I252 Old myocardial infarction: Secondary | ICD-10-CM | POA: Diagnosis not present

## 2018-08-25 DIAGNOSIS — M1991 Primary osteoarthritis, unspecified site: Secondary | ICD-10-CM | POA: Diagnosis not present

## 2018-08-25 DIAGNOSIS — E1140 Type 2 diabetes mellitus with diabetic neuropathy, unspecified: Secondary | ICD-10-CM | POA: Diagnosis not present

## 2018-08-25 DIAGNOSIS — I350 Nonrheumatic aortic (valve) stenosis: Secondary | ICD-10-CM | POA: Diagnosis not present

## 2018-08-25 DIAGNOSIS — I10 Essential (primary) hypertension: Secondary | ICD-10-CM | POA: Diagnosis not present

## 2018-08-25 DIAGNOSIS — I2510 Atherosclerotic heart disease of native coronary artery without angina pectoris: Secondary | ICD-10-CM | POA: Diagnosis not present

## 2018-08-25 DIAGNOSIS — K219 Gastro-esophageal reflux disease without esophagitis: Secondary | ICD-10-CM | POA: Diagnosis not present

## 2018-08-25 DIAGNOSIS — E782 Mixed hyperlipidemia: Secondary | ICD-10-CM | POA: Diagnosis not present

## 2018-08-25 DIAGNOSIS — M4807 Spinal stenosis, lumbosacral region: Secondary | ICD-10-CM | POA: Diagnosis not present

## 2018-08-25 DIAGNOSIS — Z951 Presence of aortocoronary bypass graft: Secondary | ICD-10-CM | POA: Diagnosis not present

## 2018-08-26 ENCOUNTER — Ambulatory Visit (INDEPENDENT_AMBULATORY_CARE_PROVIDER_SITE_OTHER): Payer: Medicare Other | Admitting: Cardiology

## 2018-08-26 ENCOUNTER — Encounter: Payer: Self-pay | Admitting: Cardiology

## 2018-08-26 ENCOUNTER — Other Ambulatory Visit: Payer: Self-pay

## 2018-08-26 VITALS — BP 130/64 | HR 52 | Ht 64.0 in | Wt 177.6 lb

## 2018-08-26 DIAGNOSIS — Z951 Presence of aortocoronary bypass graft: Secondary | ICD-10-CM

## 2018-08-26 DIAGNOSIS — I350 Nonrheumatic aortic (valve) stenosis: Secondary | ICD-10-CM | POA: Diagnosis not present

## 2018-08-26 DIAGNOSIS — R0609 Other forms of dyspnea: Secondary | ICD-10-CM

## 2018-08-26 DIAGNOSIS — I252 Old myocardial infarction: Secondary | ICD-10-CM | POA: Diagnosis not present

## 2018-08-26 DIAGNOSIS — Z9181 History of falling: Secondary | ICD-10-CM | POA: Diagnosis not present

## 2018-08-26 DIAGNOSIS — R296 Repeated falls: Secondary | ICD-10-CM | POA: Diagnosis not present

## 2018-08-26 DIAGNOSIS — K219 Gastro-esophageal reflux disease without esophagitis: Secondary | ICD-10-CM | POA: Diagnosis not present

## 2018-08-26 DIAGNOSIS — I10 Essential (primary) hypertension: Secondary | ICD-10-CM

## 2018-08-26 DIAGNOSIS — I2510 Atherosclerotic heart disease of native coronary artery without angina pectoris: Secondary | ICD-10-CM | POA: Diagnosis not present

## 2018-08-26 DIAGNOSIS — M1991 Primary osteoarthritis, unspecified site: Secondary | ICD-10-CM | POA: Diagnosis not present

## 2018-08-26 DIAGNOSIS — D696 Thrombocytopenia, unspecified: Secondary | ICD-10-CM | POA: Diagnosis not present

## 2018-08-26 DIAGNOSIS — M4807 Spinal stenosis, lumbosacral region: Secondary | ICD-10-CM | POA: Diagnosis not present

## 2018-08-26 DIAGNOSIS — G2581 Restless legs syndrome: Secondary | ICD-10-CM | POA: Diagnosis not present

## 2018-08-26 DIAGNOSIS — E1140 Type 2 diabetes mellitus with diabetic neuropathy, unspecified: Secondary | ICD-10-CM | POA: Diagnosis not present

## 2018-08-26 DIAGNOSIS — G8929 Other chronic pain: Secondary | ICD-10-CM | POA: Diagnosis not present

## 2018-08-26 DIAGNOSIS — Z7982 Long term (current) use of aspirin: Secondary | ICD-10-CM | POA: Diagnosis not present

## 2018-08-26 DIAGNOSIS — E782 Mixed hyperlipidemia: Secondary | ICD-10-CM | POA: Diagnosis not present

## 2018-08-26 NOTE — Patient Instructions (Signed)
Medication Instructions:  Your physician recommends that you continue on your current medications as directed. Please refer to the Current Medication list given to you today.  If you need a refill on your cardiac medications before your next appointment, please call your pharmacy.   Lab work: None.  If you have labs (blood work) drawn today and your tests are completely normal, you will receive your results only by: Marland Kitchen MyChart Message (if you have MyChart) OR . A paper copy in the mail If you have any lab test that is abnormal or we need to change your treatment, we will call you to review the results.  Testing/Procedures: Your physician has requested that you have a carotid duplex. This test is an ultrasound of the carotid arteries in your neck. It looks at blood flow through these arteries that supply the brain with blood. Allow one hour for this exam. There are no restrictions or special instructions.    Follow-Up: At [facility], you and your health needs are our priority.  As part of our continuing mission to provide you with exceptional heart care, we have created designated Provider Care Teams.  These Care Teams include your primary Cardiologist (physician) and Advanced Practice Providers (APPs -  Physician Assistants and Nurse Practitioners) who all work together to provide you with the care you need, when you need it. You will need a follow up appointment in 4 months.  Please call our office 2 months in advance to schedule this appointment.  You may see No primary care provider on file. or another member of our [employer] Provider Team in [place]: Norman Herrlich, MD . Belva Crome, MD  Any Other Special Instructions Will Be Listed Below (If Applicable).

## 2018-08-26 NOTE — Progress Notes (Signed)
Cardiology Office Note:    Date:  08/26/2018   ID:  Jack Hodges, DOB May 11, 1930, MRN [id number]  PCP:  Abigail Miyamoto, MD  Cardiologist:  Gypsy Balsam, MD    Referring MD: Abigail Miyamoto,*   Chief Complaint  Patient presents with  . 6 week follow up  I am doing well  History of Present Illness:    Jack Hodges Jon is a 85 y.o. male with coronary disease, status post coronary artery bypass graft, aortic stenosis last time assessed as moderate, essential hypertension that seems to be different comes to the office for follow-up he supposed to have a [facility] visit but he showed up in our office although he is doing better the biggest complaint he has is neuropathy he is both arms.  Denies having swelling of lower extremities blood pressure seems to be behaving.  No chest pain tightness squeezing pressure been chest no TIA/CVA-like symptoms  Past Medical History:  Diagnosis Date  . Arthritis   . Bilateral foot-drop 01/18/2017  . Coronary artery disease involving native coronary artery of native heart without angina pectoris 11/01/2015   Overview:  Bypass surgery in 1996 Overview:  Overview:  Bypass surgery in 1996  Last Assessment & Plan:  Continue his current regimen.  Follow up as noted.  . CVA (cerebral vascular accident) (HCC) 04/03/2016   Last Assessment & Plan:  The patient underwent extensive CVA workup.  MRI is negative.  He does have aortic stenosis by echocardiogram.  Upon review of Care everywhere he is followed by Dr. Bing Matter, cardiologist for this.  Carotid ultrasound negative for hemodynamically significant stenosis.  Lipid panel within normal limits.  He has not had any further episodes of dizziness or nausea vomiting.   . Dementia (HCC)   . Depression   . Dizziness 04/19/2016  . DNR (do not resuscitate) 04/03/2016   Overview:  I had an extensive discussion with the patient on 04/03/2016 regarding his end of life wishes.  He and his daughter agree that do not  resuscitate is in keeping with his beliefs.  . Dyslipidemia 11/01/2015   Last Assessment & Plan:  Formatting of this note might be different from the original. Cholesterol 136   Triglycerides  83  HDL  50.1  LDL Calculated  69  VLDL Cholesterol Cal  17   Continue statin.  . Essential hypertension 11/01/2015   Last Assessment & Plan:  Resume home medications.  Follow-up as noted above.  . Hypertension   . Neuropathy   . Sleep apnea   . 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    Past Surgical History:  Procedure Laterality Date  . CARDIAC SURGERY      Current Medications: Current Meds  Medication Sig  . amLODipine (NORVASC) 10 MG tablet Take 10 mg by mouth.  Marland Kitchen aspirin EC 81 MG tablet Take 81 mg by mouth daily.  . enalapril (VASOTEC) 20 MG tablet Take 20 mg by mouth daily.  . furosemide (LASIX) 40 MG tablet Take 1 tablet (40 mg total) by mouth daily.  Marland Kitchen gabapentin (NEURONTIN) 300 MG capsule Take 2 capsules by mouth 2 (two) times daily.  Marland Kitchen omeprazole (PRILOSEC) 20 MG capsule Take 20 mg by mouth.  . potassium chloride (K-DUR) 10 MEQ tablet Take 1 tablet (10 mEq total) by mouth daily.  . primidone (MYSOLINE) 50 MG tablet Take 1  tablet twice a day  . sertraline (ZOLOFT) 50 MG tablet 50 mg daily.  . tamsulosin (FLOMAX) 0.4 MG CAPS capsule as needed.     Allergies:   Patient has no known allergies.   Social History   Socioeconomic History  . Marital status: Married    Spouse name: Not on file  . Number of children: Not on file  . Years of education: Not on file  . Highest education level: Not on file  Occupational History  . Not on file  Social Needs  . Financial resource strain: Not on file  . Food insecurity:    Worry: Not on file    Inability: Not on file  . Transportation needs:    Medical: Not on file    Non-medical: Not on file  Tobacco  Use  . Smoking status: Former Smoker    Last attempt to quit: 05/02/1962    Years since quitting: 56.3  . Smokeless tobacco: Never Used  Substance and Sexual Activity  . Alcohol use: No    Frequency: Never  . Drug use: No  . Sexual activity: Not on file  Lifestyle  . Physical activity:    Days per week: Not on file    Minutes per session: Not on file  . Stress: Not on file  Relationships  . Social connections:    Talks on phone: Not on file    Gets together: Not on file    Attends religious service: Not on file    Active member of club or organization: Not on file    Attends meetings of clubs or organizations: Not on file    Relationship status: Not on file  Other Topics Concern  . Not on file  Social History Narrative  . Not on file     Family History: The patient's family history includes Arthritis in his brother and father; Cancer in his brother and mother; Diabetes in his mother; Hypertension in his father; Kidney disease in his mother; Migraines in his father; Sleep disorder in his father and mother; Stroke in his mother. ROS:   Please see the history of present illness.    All 14 point review of systems negative except as described per history of present illness  EKGs/Labs/Other Studies Reviewed:      Recent Labs: 07/02/2018: BUN 16; Creatinine, Ser 0.85; Hemoglobin 13.5; NT-Pro BNP 660; Platelets 95; Potassium 3.7; Sodium 143  Recent Lipid Panel No results found for: CHOL, TRIG, HDL, CHOLHDL, VLDL, LDLCALC, LDLDIRECT  Physical Exam:    VS:  BP 130/64   Pulse (!) 52   Ht  (1.626 m)   Wt 177 lb 9.6 oz (80.6 kg)   SpO2 98%   BMI 30.48 kg/m     Wt Readings from Last 3 Encounters:  08/26/18 177 lb 9.6 oz (80.6 kg)  07/16/18 170 lb (77.1 kg)  07/02/18 177 lb (80.3 kg)     GEN:  Well nourished, well developed in no acute distress HEENT: Normal NECK: No JVD; left carotid bruit present LYMPHATICS: No lymphadenopathy CARDIAC: RRR, systolic ejection murmur  grade 3/6 best heard at the right upper portion of the sternum, no rubs, no gallops RESPIRATORY:  Clear to auscultation without rales, wheezing or rhonchi  ABDOMEN: Soft, non-tender, non-distended MUSCULOSKELETAL:  No edema; No deformity  SKIN: Warm and dry LOWER EXTREMITIES: no swelling NEUROLOGIC:  Alert and oriented x 3 PSYCHIATRIC:  Normal affect   ASSESSMENT:    1. Essential hypertension   2. Nonrheumatic  aortic valve stenosis   3. Status post coronary artery bypass graft   4. Dyspnea on exertion    PLAN:    In order of problems listed above:  1. Essential hypertension blood pressure well controlled today continue present management. 2. Nonrheumatic aortic valve stenosis last echocardiogram from 14 February showed mean gradient across aortic valve of 25 mmHg, peak gradient 45 mmHg.  Calculated aortic valve area was more than 1 cm.  We will continue monitoring. 3. Status post coronary artery bypass graft.  Noted asymptomatic 4. Dyspnea on exertion improved. 5. Left carotid bruit he did have a carotid ultrasound scheduled but it did not happen will reschedule again this test.  Overall he is doing well we will continue present management and I see him back in the office in about 5 months   Medication Adjustments/Labs and Tests Ordered: Current medicines are reviewed at length with the patient today.  Concerns regarding medicines are outlined above.  No orders of the defined types were placed in this encounter.  Medication changes: No orders of the defined types were placed in this encounter.   Signed, Georgeanna Lea, MD, [facility] 08/26/2018 10:02 AM    [facility]

## 2018-08-27 DIAGNOSIS — Z951 Presence of aortocoronary bypass graft: Secondary | ICD-10-CM | POA: Diagnosis not present

## 2018-08-27 DIAGNOSIS — R296 Repeated falls: Secondary | ICD-10-CM | POA: Diagnosis not present

## 2018-08-27 DIAGNOSIS — G8929 Other chronic pain: Secondary | ICD-10-CM | POA: Diagnosis not present

## 2018-08-27 DIAGNOSIS — G2581 Restless legs syndrome: Secondary | ICD-10-CM | POA: Diagnosis not present

## 2018-08-27 DIAGNOSIS — E1140 Type 2 diabetes mellitus with diabetic neuropathy, unspecified: Secondary | ICD-10-CM | POA: Diagnosis not present

## 2018-08-27 DIAGNOSIS — D696 Thrombocytopenia, unspecified: Secondary | ICD-10-CM | POA: Diagnosis not present

## 2018-08-27 DIAGNOSIS — I252 Old myocardial infarction: Secondary | ICD-10-CM | POA: Diagnosis not present

## 2018-08-27 DIAGNOSIS — M1991 Primary osteoarthritis, unspecified site: Secondary | ICD-10-CM | POA: Diagnosis not present

## 2018-08-27 DIAGNOSIS — I10 Essential (primary) hypertension: Secondary | ICD-10-CM | POA: Diagnosis not present

## 2018-08-27 DIAGNOSIS — Z7982 Long term (current) use of aspirin: Secondary | ICD-10-CM | POA: Diagnosis not present

## 2018-08-27 DIAGNOSIS — I350 Nonrheumatic aortic (valve) stenosis: Secondary | ICD-10-CM | POA: Diagnosis not present

## 2018-08-27 DIAGNOSIS — I2510 Atherosclerotic heart disease of native coronary artery without angina pectoris: Secondary | ICD-10-CM | POA: Diagnosis not present

## 2018-08-27 DIAGNOSIS — Z9181 History of falling: Secondary | ICD-10-CM | POA: Diagnosis not present

## 2018-08-27 DIAGNOSIS — M4807 Spinal stenosis, lumbosacral region: Secondary | ICD-10-CM | POA: Diagnosis not present

## 2018-08-27 DIAGNOSIS — E782 Mixed hyperlipidemia: Secondary | ICD-10-CM | POA: Diagnosis not present

## 2018-08-27 DIAGNOSIS — K219 Gastro-esophageal reflux disease without esophagitis: Secondary | ICD-10-CM | POA: Diagnosis not present

## 2018-08-29 DIAGNOSIS — Z951 Presence of aortocoronary bypass graft: Secondary | ICD-10-CM | POA: Diagnosis not present

## 2018-08-29 DIAGNOSIS — M1991 Primary osteoarthritis, unspecified site: Secondary | ICD-10-CM | POA: Diagnosis not present

## 2018-08-29 DIAGNOSIS — R296 Repeated falls: Secondary | ICD-10-CM | POA: Diagnosis not present

## 2018-08-29 DIAGNOSIS — E1140 Type 2 diabetes mellitus with diabetic neuropathy, unspecified: Secondary | ICD-10-CM | POA: Diagnosis not present

## 2018-08-29 DIAGNOSIS — Z7982 Long term (current) use of aspirin: Secondary | ICD-10-CM | POA: Diagnosis not present

## 2018-08-29 DIAGNOSIS — D696 Thrombocytopenia, unspecified: Secondary | ICD-10-CM | POA: Diagnosis not present

## 2018-08-29 DIAGNOSIS — Z9181 History of falling: Secondary | ICD-10-CM | POA: Diagnosis not present

## 2018-08-29 DIAGNOSIS — I2510 Atherosclerotic heart disease of native coronary artery without angina pectoris: Secondary | ICD-10-CM | POA: Diagnosis not present

## 2018-08-29 DIAGNOSIS — I350 Nonrheumatic aortic (valve) stenosis: Secondary | ICD-10-CM | POA: Diagnosis not present

## 2018-08-29 DIAGNOSIS — G8929 Other chronic pain: Secondary | ICD-10-CM | POA: Diagnosis not present

## 2018-08-29 DIAGNOSIS — E782 Mixed hyperlipidemia: Secondary | ICD-10-CM | POA: Diagnosis not present

## 2018-08-29 DIAGNOSIS — I252 Old myocardial infarction: Secondary | ICD-10-CM | POA: Diagnosis not present

## 2018-08-29 DIAGNOSIS — M4807 Spinal stenosis, lumbosacral region: Secondary | ICD-10-CM | POA: Diagnosis not present

## 2018-08-29 DIAGNOSIS — G2581 Restless legs syndrome: Secondary | ICD-10-CM | POA: Diagnosis not present

## 2018-08-29 DIAGNOSIS — K219 Gastro-esophageal reflux disease without esophagitis: Secondary | ICD-10-CM | POA: Diagnosis not present

## 2018-08-29 DIAGNOSIS — I10 Essential (primary) hypertension: Secondary | ICD-10-CM | POA: Diagnosis not present

## 2018-09-01 DIAGNOSIS — I2510 Atherosclerotic heart disease of native coronary artery without angina pectoris: Secondary | ICD-10-CM | POA: Diagnosis not present

## 2018-09-01 DIAGNOSIS — E1140 Type 2 diabetes mellitus with diabetic neuropathy, unspecified: Secondary | ICD-10-CM | POA: Diagnosis not present

## 2018-09-01 DIAGNOSIS — M1991 Primary osteoarthritis, unspecified site: Secondary | ICD-10-CM | POA: Diagnosis not present

## 2018-09-01 DIAGNOSIS — R296 Repeated falls: Secondary | ICD-10-CM | POA: Diagnosis not present

## 2018-09-01 DIAGNOSIS — G2581 Restless legs syndrome: Secondary | ICD-10-CM | POA: Diagnosis not present

## 2018-09-01 DIAGNOSIS — K219 Gastro-esophageal reflux disease without esophagitis: Secondary | ICD-10-CM | POA: Diagnosis not present

## 2018-09-01 DIAGNOSIS — I10 Essential (primary) hypertension: Secondary | ICD-10-CM | POA: Diagnosis not present

## 2018-09-01 DIAGNOSIS — D696 Thrombocytopenia, unspecified: Secondary | ICD-10-CM | POA: Diagnosis not present

## 2018-09-01 DIAGNOSIS — Z7982 Long term (current) use of aspirin: Secondary | ICD-10-CM | POA: Diagnosis not present

## 2018-09-01 DIAGNOSIS — I350 Nonrheumatic aortic (valve) stenosis: Secondary | ICD-10-CM | POA: Diagnosis not present

## 2018-09-01 DIAGNOSIS — Z9181 History of falling: Secondary | ICD-10-CM | POA: Diagnosis not present

## 2018-09-01 DIAGNOSIS — I252 Old myocardial infarction: Secondary | ICD-10-CM | POA: Diagnosis not present

## 2018-09-01 DIAGNOSIS — G8929 Other chronic pain: Secondary | ICD-10-CM | POA: Diagnosis not present

## 2018-09-01 DIAGNOSIS — Z951 Presence of aortocoronary bypass graft: Secondary | ICD-10-CM | POA: Diagnosis not present

## 2018-09-01 DIAGNOSIS — M4807 Spinal stenosis, lumbosacral region: Secondary | ICD-10-CM | POA: Diagnosis not present

## 2018-09-01 DIAGNOSIS — E782 Mixed hyperlipidemia: Secondary | ICD-10-CM | POA: Diagnosis not present

## 2018-09-02 DIAGNOSIS — M75112 Incomplete rotator cuff tear or rupture of left shoulder, not specified as traumatic: Secondary | ICD-10-CM | POA: Diagnosis not present

## 2018-09-03 DIAGNOSIS — G2581 Restless legs syndrome: Secondary | ICD-10-CM | POA: Diagnosis not present

## 2018-09-03 DIAGNOSIS — M4807 Spinal stenosis, lumbosacral region: Secondary | ICD-10-CM | POA: Diagnosis not present

## 2018-09-03 DIAGNOSIS — I350 Nonrheumatic aortic (valve) stenosis: Secondary | ICD-10-CM | POA: Diagnosis not present

## 2018-09-03 DIAGNOSIS — E1140 Type 2 diabetes mellitus with diabetic neuropathy, unspecified: Secondary | ICD-10-CM | POA: Diagnosis not present

## 2018-09-03 DIAGNOSIS — Z951 Presence of aortocoronary bypass graft: Secondary | ICD-10-CM | POA: Diagnosis not present

## 2018-09-03 DIAGNOSIS — K219 Gastro-esophageal reflux disease without esophagitis: Secondary | ICD-10-CM | POA: Diagnosis not present

## 2018-09-03 DIAGNOSIS — D696 Thrombocytopenia, unspecified: Secondary | ICD-10-CM | POA: Diagnosis not present

## 2018-09-03 DIAGNOSIS — G8929 Other chronic pain: Secondary | ICD-10-CM | POA: Diagnosis not present

## 2018-09-03 DIAGNOSIS — I10 Essential (primary) hypertension: Secondary | ICD-10-CM | POA: Diagnosis not present

## 2018-09-03 DIAGNOSIS — R296 Repeated falls: Secondary | ICD-10-CM | POA: Diagnosis not present

## 2018-09-03 DIAGNOSIS — M1991 Primary osteoarthritis, unspecified site: Secondary | ICD-10-CM | POA: Diagnosis not present

## 2018-09-03 DIAGNOSIS — I252 Old myocardial infarction: Secondary | ICD-10-CM | POA: Diagnosis not present

## 2018-09-03 DIAGNOSIS — E782 Mixed hyperlipidemia: Secondary | ICD-10-CM | POA: Diagnosis not present

## 2018-09-03 DIAGNOSIS — I2510 Atherosclerotic heart disease of native coronary artery without angina pectoris: Secondary | ICD-10-CM | POA: Diagnosis not present

## 2018-09-03 DIAGNOSIS — Z9181 History of falling: Secondary | ICD-10-CM | POA: Diagnosis not present

## 2018-09-03 DIAGNOSIS — Z7982 Long term (current) use of aspirin: Secondary | ICD-10-CM | POA: Diagnosis not present

## 2018-09-04 DIAGNOSIS — I2510 Atherosclerotic heart disease of native coronary artery without angina pectoris: Secondary | ICD-10-CM | POA: Diagnosis not present

## 2018-09-04 DIAGNOSIS — K219 Gastro-esophageal reflux disease without esophagitis: Secondary | ICD-10-CM | POA: Diagnosis not present

## 2018-09-04 DIAGNOSIS — Z7982 Long term (current) use of aspirin: Secondary | ICD-10-CM | POA: Diagnosis not present

## 2018-09-04 DIAGNOSIS — E782 Mixed hyperlipidemia: Secondary | ICD-10-CM | POA: Diagnosis not present

## 2018-09-04 DIAGNOSIS — I252 Old myocardial infarction: Secondary | ICD-10-CM | POA: Diagnosis not present

## 2018-09-04 DIAGNOSIS — I10 Essential (primary) hypertension: Secondary | ICD-10-CM | POA: Diagnosis not present

## 2018-09-04 DIAGNOSIS — D696 Thrombocytopenia, unspecified: Secondary | ICD-10-CM | POA: Diagnosis not present

## 2018-09-04 DIAGNOSIS — E1140 Type 2 diabetes mellitus with diabetic neuropathy, unspecified: Secondary | ICD-10-CM | POA: Diagnosis not present

## 2018-09-04 DIAGNOSIS — Z9181 History of falling: Secondary | ICD-10-CM | POA: Diagnosis not present

## 2018-09-04 DIAGNOSIS — G2581 Restless legs syndrome: Secondary | ICD-10-CM | POA: Diagnosis not present

## 2018-09-04 DIAGNOSIS — M4807 Spinal stenosis, lumbosacral region: Secondary | ICD-10-CM | POA: Diagnosis not present

## 2018-09-04 DIAGNOSIS — R296 Repeated falls: Secondary | ICD-10-CM | POA: Diagnosis not present

## 2018-09-04 DIAGNOSIS — Z951 Presence of aortocoronary bypass graft: Secondary | ICD-10-CM | POA: Diagnosis not present

## 2018-09-04 DIAGNOSIS — G8929 Other chronic pain: Secondary | ICD-10-CM | POA: Diagnosis not present

## 2018-09-04 DIAGNOSIS — M1991 Primary osteoarthritis, unspecified site: Secondary | ICD-10-CM | POA: Diagnosis not present

## 2018-09-04 DIAGNOSIS — I350 Nonrheumatic aortic (valve) stenosis: Secondary | ICD-10-CM | POA: Diagnosis not present

## 2018-09-08 DIAGNOSIS — E782 Mixed hyperlipidemia: Secondary | ICD-10-CM | POA: Diagnosis not present

## 2018-09-08 DIAGNOSIS — I350 Nonrheumatic aortic (valve) stenosis: Secondary | ICD-10-CM | POA: Diagnosis not present

## 2018-09-08 DIAGNOSIS — G2581 Restless legs syndrome: Secondary | ICD-10-CM | POA: Diagnosis not present

## 2018-09-08 DIAGNOSIS — Z951 Presence of aortocoronary bypass graft: Secondary | ICD-10-CM | POA: Diagnosis not present

## 2018-09-08 DIAGNOSIS — I252 Old myocardial infarction: Secondary | ICD-10-CM | POA: Diagnosis not present

## 2018-09-08 DIAGNOSIS — Z7982 Long term (current) use of aspirin: Secondary | ICD-10-CM | POA: Diagnosis not present

## 2018-09-08 DIAGNOSIS — I10 Essential (primary) hypertension: Secondary | ICD-10-CM | POA: Diagnosis not present

## 2018-09-08 DIAGNOSIS — K219 Gastro-esophageal reflux disease without esophagitis: Secondary | ICD-10-CM | POA: Diagnosis not present

## 2018-09-08 DIAGNOSIS — Z9181 History of falling: Secondary | ICD-10-CM | POA: Diagnosis not present

## 2018-09-08 DIAGNOSIS — E1140 Type 2 diabetes mellitus with diabetic neuropathy, unspecified: Secondary | ICD-10-CM | POA: Diagnosis not present

## 2018-09-08 DIAGNOSIS — R296 Repeated falls: Secondary | ICD-10-CM | POA: Diagnosis not present

## 2018-09-08 DIAGNOSIS — G8929 Other chronic pain: Secondary | ICD-10-CM | POA: Diagnosis not present

## 2018-09-08 DIAGNOSIS — D696 Thrombocytopenia, unspecified: Secondary | ICD-10-CM | POA: Diagnosis not present

## 2018-09-08 DIAGNOSIS — I2510 Atherosclerotic heart disease of native coronary artery without angina pectoris: Secondary | ICD-10-CM | POA: Diagnosis not present

## 2018-09-08 DIAGNOSIS — M4807 Spinal stenosis, lumbosacral region: Secondary | ICD-10-CM | POA: Diagnosis not present

## 2018-09-08 DIAGNOSIS — M1991 Primary osteoarthritis, unspecified site: Secondary | ICD-10-CM | POA: Diagnosis not present

## 2018-09-09 DIAGNOSIS — G2581 Restless legs syndrome: Secondary | ICD-10-CM | POA: Diagnosis not present

## 2018-09-09 DIAGNOSIS — Z951 Presence of aortocoronary bypass graft: Secondary | ICD-10-CM | POA: Diagnosis not present

## 2018-09-09 DIAGNOSIS — M4807 Spinal stenosis, lumbosacral region: Secondary | ICD-10-CM | POA: Diagnosis not present

## 2018-09-09 DIAGNOSIS — I350 Nonrheumatic aortic (valve) stenosis: Secondary | ICD-10-CM | POA: Diagnosis not present

## 2018-09-09 DIAGNOSIS — R296 Repeated falls: Secondary | ICD-10-CM | POA: Diagnosis not present

## 2018-09-09 DIAGNOSIS — K219 Gastro-esophageal reflux disease without esophagitis: Secondary | ICD-10-CM | POA: Diagnosis not present

## 2018-09-09 DIAGNOSIS — D696 Thrombocytopenia, unspecified: Secondary | ICD-10-CM | POA: Diagnosis not present

## 2018-09-09 DIAGNOSIS — M1991 Primary osteoarthritis, unspecified site: Secondary | ICD-10-CM | POA: Diagnosis not present

## 2018-09-09 DIAGNOSIS — I10 Essential (primary) hypertension: Secondary | ICD-10-CM | POA: Diagnosis not present

## 2018-09-09 DIAGNOSIS — G8929 Other chronic pain: Secondary | ICD-10-CM | POA: Diagnosis not present

## 2018-09-09 DIAGNOSIS — E782 Mixed hyperlipidemia: Secondary | ICD-10-CM | POA: Diagnosis not present

## 2018-09-09 DIAGNOSIS — I252 Old myocardial infarction: Secondary | ICD-10-CM | POA: Diagnosis not present

## 2018-09-09 DIAGNOSIS — Z9181 History of falling: Secondary | ICD-10-CM | POA: Diagnosis not present

## 2018-09-09 DIAGNOSIS — I2510 Atherosclerotic heart disease of native coronary artery without angina pectoris: Secondary | ICD-10-CM | POA: Diagnosis not present

## 2018-09-09 DIAGNOSIS — E1140 Type 2 diabetes mellitus with diabetic neuropathy, unspecified: Secondary | ICD-10-CM | POA: Diagnosis not present

## 2018-09-09 DIAGNOSIS — Z7982 Long term (current) use of aspirin: Secondary | ICD-10-CM | POA: Diagnosis not present

## 2018-09-10 DIAGNOSIS — Z951 Presence of aortocoronary bypass graft: Secondary | ICD-10-CM | POA: Diagnosis not present

## 2018-09-10 DIAGNOSIS — I10 Essential (primary) hypertension: Secondary | ICD-10-CM | POA: Diagnosis not present

## 2018-09-10 DIAGNOSIS — M4807 Spinal stenosis, lumbosacral region: Secondary | ICD-10-CM | POA: Diagnosis not present

## 2018-09-10 DIAGNOSIS — E1140 Type 2 diabetes mellitus with diabetic neuropathy, unspecified: Secondary | ICD-10-CM | POA: Diagnosis not present

## 2018-09-10 DIAGNOSIS — D696 Thrombocytopenia, unspecified: Secondary | ICD-10-CM | POA: Diagnosis not present

## 2018-09-10 DIAGNOSIS — E782 Mixed hyperlipidemia: Secondary | ICD-10-CM | POA: Diagnosis not present

## 2018-09-10 DIAGNOSIS — I252 Old myocardial infarction: Secondary | ICD-10-CM | POA: Diagnosis not present

## 2018-09-10 DIAGNOSIS — I2510 Atherosclerotic heart disease of native coronary artery without angina pectoris: Secondary | ICD-10-CM | POA: Diagnosis not present

## 2018-09-10 DIAGNOSIS — I350 Nonrheumatic aortic (valve) stenosis: Secondary | ICD-10-CM | POA: Diagnosis not present

## 2018-09-10 DIAGNOSIS — G8929 Other chronic pain: Secondary | ICD-10-CM | POA: Diagnosis not present

## 2018-09-10 DIAGNOSIS — M1991 Primary osteoarthritis, unspecified site: Secondary | ICD-10-CM | POA: Diagnosis not present

## 2018-09-10 DIAGNOSIS — Z9181 History of falling: Secondary | ICD-10-CM | POA: Diagnosis not present

## 2018-09-10 DIAGNOSIS — Z7982 Long term (current) use of aspirin: Secondary | ICD-10-CM | POA: Diagnosis not present

## 2018-09-10 DIAGNOSIS — G2581 Restless legs syndrome: Secondary | ICD-10-CM | POA: Diagnosis not present

## 2018-09-10 DIAGNOSIS — K219 Gastro-esophageal reflux disease without esophagitis: Secondary | ICD-10-CM | POA: Diagnosis not present

## 2018-09-10 DIAGNOSIS — R296 Repeated falls: Secondary | ICD-10-CM | POA: Diagnosis not present

## 2018-09-11 DIAGNOSIS — G8929 Other chronic pain: Secondary | ICD-10-CM | POA: Diagnosis not present

## 2018-09-11 DIAGNOSIS — M1991 Primary osteoarthritis, unspecified site: Secondary | ICD-10-CM | POA: Diagnosis not present

## 2018-09-11 DIAGNOSIS — E782 Mixed hyperlipidemia: Secondary | ICD-10-CM | POA: Diagnosis not present

## 2018-09-11 DIAGNOSIS — R296 Repeated falls: Secondary | ICD-10-CM | POA: Diagnosis not present

## 2018-09-11 DIAGNOSIS — D696 Thrombocytopenia, unspecified: Secondary | ICD-10-CM | POA: Diagnosis not present

## 2018-09-11 DIAGNOSIS — Z9181 History of falling: Secondary | ICD-10-CM | POA: Diagnosis not present

## 2018-09-11 DIAGNOSIS — Z7982 Long term (current) use of aspirin: Secondary | ICD-10-CM | POA: Diagnosis not present

## 2018-09-11 DIAGNOSIS — I252 Old myocardial infarction: Secondary | ICD-10-CM | POA: Diagnosis not present

## 2018-09-11 DIAGNOSIS — M4807 Spinal stenosis, lumbosacral region: Secondary | ICD-10-CM | POA: Diagnosis not present

## 2018-09-11 DIAGNOSIS — Z951 Presence of aortocoronary bypass graft: Secondary | ICD-10-CM | POA: Diagnosis not present

## 2018-09-11 DIAGNOSIS — E1140 Type 2 diabetes mellitus with diabetic neuropathy, unspecified: Secondary | ICD-10-CM | POA: Diagnosis not present

## 2018-09-11 DIAGNOSIS — I10 Essential (primary) hypertension: Secondary | ICD-10-CM | POA: Diagnosis not present

## 2018-09-11 DIAGNOSIS — I2510 Atherosclerotic heart disease of native coronary artery without angina pectoris: Secondary | ICD-10-CM | POA: Diagnosis not present

## 2018-09-11 DIAGNOSIS — K219 Gastro-esophageal reflux disease without esophagitis: Secondary | ICD-10-CM | POA: Diagnosis not present

## 2018-09-11 DIAGNOSIS — G2581 Restless legs syndrome: Secondary | ICD-10-CM | POA: Diagnosis not present

## 2018-09-11 DIAGNOSIS — I350 Nonrheumatic aortic (valve) stenosis: Secondary | ICD-10-CM | POA: Diagnosis not present

## 2018-09-16 DIAGNOSIS — Z951 Presence of aortocoronary bypass graft: Secondary | ICD-10-CM | POA: Diagnosis not present

## 2018-09-16 DIAGNOSIS — M1991 Primary osteoarthritis, unspecified site: Secondary | ICD-10-CM | POA: Diagnosis not present

## 2018-09-16 DIAGNOSIS — Z7982 Long term (current) use of aspirin: Secondary | ICD-10-CM | POA: Diagnosis not present

## 2018-09-16 DIAGNOSIS — E1140 Type 2 diabetes mellitus with diabetic neuropathy, unspecified: Secondary | ICD-10-CM | POA: Diagnosis not present

## 2018-09-16 DIAGNOSIS — M4807 Spinal stenosis, lumbosacral region: Secondary | ICD-10-CM | POA: Diagnosis not present

## 2018-09-16 DIAGNOSIS — D696 Thrombocytopenia, unspecified: Secondary | ICD-10-CM | POA: Diagnosis not present

## 2018-09-16 DIAGNOSIS — G8929 Other chronic pain: Secondary | ICD-10-CM | POA: Diagnosis not present

## 2018-09-16 DIAGNOSIS — I2510 Atherosclerotic heart disease of native coronary artery without angina pectoris: Secondary | ICD-10-CM | POA: Diagnosis not present

## 2018-09-16 DIAGNOSIS — K219 Gastro-esophageal reflux disease without esophagitis: Secondary | ICD-10-CM | POA: Diagnosis not present

## 2018-09-16 DIAGNOSIS — Z9181 History of falling: Secondary | ICD-10-CM | POA: Diagnosis not present

## 2018-09-16 DIAGNOSIS — R296 Repeated falls: Secondary | ICD-10-CM | POA: Diagnosis not present

## 2018-09-16 DIAGNOSIS — I350 Nonrheumatic aortic (valve) stenosis: Secondary | ICD-10-CM | POA: Diagnosis not present

## 2018-09-16 DIAGNOSIS — I10 Essential (primary) hypertension: Secondary | ICD-10-CM | POA: Diagnosis not present

## 2018-09-16 DIAGNOSIS — I252 Old myocardial infarction: Secondary | ICD-10-CM | POA: Diagnosis not present

## 2018-09-16 DIAGNOSIS — G2581 Restless legs syndrome: Secondary | ICD-10-CM | POA: Diagnosis not present

## 2018-09-16 DIAGNOSIS — E782 Mixed hyperlipidemia: Secondary | ICD-10-CM | POA: Diagnosis not present

## 2018-09-18 DIAGNOSIS — Z9181 History of falling: Secondary | ICD-10-CM | POA: Diagnosis not present

## 2018-09-18 DIAGNOSIS — M4807 Spinal stenosis, lumbosacral region: Secondary | ICD-10-CM | POA: Diagnosis not present

## 2018-09-18 DIAGNOSIS — R296 Repeated falls: Secondary | ICD-10-CM | POA: Diagnosis not present

## 2018-09-18 DIAGNOSIS — I252 Old myocardial infarction: Secondary | ICD-10-CM | POA: Diagnosis not present

## 2018-09-18 DIAGNOSIS — K219 Gastro-esophageal reflux disease without esophagitis: Secondary | ICD-10-CM | POA: Diagnosis not present

## 2018-09-18 DIAGNOSIS — E1140 Type 2 diabetes mellitus with diabetic neuropathy, unspecified: Secondary | ICD-10-CM | POA: Diagnosis not present

## 2018-09-18 DIAGNOSIS — I350 Nonrheumatic aortic (valve) stenosis: Secondary | ICD-10-CM | POA: Diagnosis not present

## 2018-09-18 DIAGNOSIS — G2581 Restless legs syndrome: Secondary | ICD-10-CM | POA: Diagnosis not present

## 2018-09-18 DIAGNOSIS — I10 Essential (primary) hypertension: Secondary | ICD-10-CM | POA: Diagnosis not present

## 2018-09-18 DIAGNOSIS — I2510 Atherosclerotic heart disease of native coronary artery without angina pectoris: Secondary | ICD-10-CM | POA: Diagnosis not present

## 2018-09-18 DIAGNOSIS — G8929 Other chronic pain: Secondary | ICD-10-CM | POA: Diagnosis not present

## 2018-09-18 DIAGNOSIS — E782 Mixed hyperlipidemia: Secondary | ICD-10-CM | POA: Diagnosis not present

## 2018-09-18 DIAGNOSIS — Z951 Presence of aortocoronary bypass graft: Secondary | ICD-10-CM | POA: Diagnosis not present

## 2018-09-18 DIAGNOSIS — Z7982 Long term (current) use of aspirin: Secondary | ICD-10-CM | POA: Diagnosis not present

## 2018-09-18 DIAGNOSIS — D696 Thrombocytopenia, unspecified: Secondary | ICD-10-CM | POA: Diagnosis not present

## 2018-09-18 DIAGNOSIS — M1991 Primary osteoarthritis, unspecified site: Secondary | ICD-10-CM | POA: Diagnosis not present

## 2018-09-22 ENCOUNTER — Ambulatory Visit (INDEPENDENT_AMBULATORY_CARE_PROVIDER_SITE_OTHER): Payer: Medicare Other

## 2018-09-22 ENCOUNTER — Other Ambulatory Visit: Payer: Self-pay

## 2018-09-22 DIAGNOSIS — I2510 Atherosclerotic heart disease of native coronary artery without angina pectoris: Secondary | ICD-10-CM | POA: Diagnosis not present

## 2018-09-22 NOTE — Progress Notes (Signed)
Bilateral carotid doppler performed   09/22/18 Cardell Peach RDCS, RVT

## 2018-09-23 DIAGNOSIS — M4807 Spinal stenosis, lumbosacral region: Secondary | ICD-10-CM | POA: Diagnosis not present

## 2018-09-23 DIAGNOSIS — Z7982 Long term (current) use of aspirin: Secondary | ICD-10-CM | POA: Diagnosis not present

## 2018-09-23 DIAGNOSIS — Z9181 History of falling: Secondary | ICD-10-CM | POA: Diagnosis not present

## 2018-09-23 DIAGNOSIS — K219 Gastro-esophageal reflux disease without esophagitis: Secondary | ICD-10-CM | POA: Diagnosis not present

## 2018-09-23 DIAGNOSIS — I252 Old myocardial infarction: Secondary | ICD-10-CM | POA: Diagnosis not present

## 2018-09-23 DIAGNOSIS — I2510 Atherosclerotic heart disease of native coronary artery without angina pectoris: Secondary | ICD-10-CM | POA: Diagnosis not present

## 2018-09-23 DIAGNOSIS — R296 Repeated falls: Secondary | ICD-10-CM | POA: Diagnosis not present

## 2018-09-23 DIAGNOSIS — M1991 Primary osteoarthritis, unspecified site: Secondary | ICD-10-CM | POA: Diagnosis not present

## 2018-09-23 DIAGNOSIS — E782 Mixed hyperlipidemia: Secondary | ICD-10-CM | POA: Diagnosis not present

## 2018-09-23 DIAGNOSIS — Z951 Presence of aortocoronary bypass graft: Secondary | ICD-10-CM | POA: Diagnosis not present

## 2018-09-23 DIAGNOSIS — I10 Essential (primary) hypertension: Secondary | ICD-10-CM | POA: Diagnosis not present

## 2018-09-23 DIAGNOSIS — D696 Thrombocytopenia, unspecified: Secondary | ICD-10-CM | POA: Diagnosis not present

## 2018-09-23 DIAGNOSIS — E1140 Type 2 diabetes mellitus with diabetic neuropathy, unspecified: Secondary | ICD-10-CM | POA: Diagnosis not present

## 2018-09-23 DIAGNOSIS — I350 Nonrheumatic aortic (valve) stenosis: Secondary | ICD-10-CM | POA: Diagnosis not present

## 2018-09-23 DIAGNOSIS — G2581 Restless legs syndrome: Secondary | ICD-10-CM | POA: Diagnosis not present

## 2018-09-23 DIAGNOSIS — G8929 Other chronic pain: Secondary | ICD-10-CM | POA: Diagnosis not present

## 2018-09-24 ENCOUNTER — Telehealth: Payer: Self-pay | Admitting: Neurology

## 2018-09-24 ENCOUNTER — Encounter: Payer: Medicare Other | Admitting: Neurology

## 2018-09-24 NOTE — Telephone Encounter (Signed)
This patient canceled the same day of an EMG and nerve conduction study evaluation. 

## 2018-09-25 DIAGNOSIS — E782 Mixed hyperlipidemia: Secondary | ICD-10-CM | POA: Diagnosis not present

## 2018-09-25 DIAGNOSIS — K219 Gastro-esophageal reflux disease without esophagitis: Secondary | ICD-10-CM | POA: Diagnosis not present

## 2018-09-25 DIAGNOSIS — I10 Essential (primary) hypertension: Secondary | ICD-10-CM | POA: Diagnosis not present

## 2018-09-25 DIAGNOSIS — G2581 Restless legs syndrome: Secondary | ICD-10-CM | POA: Diagnosis not present

## 2018-09-25 DIAGNOSIS — M4807 Spinal stenosis, lumbosacral region: Secondary | ICD-10-CM | POA: Diagnosis not present

## 2018-09-25 DIAGNOSIS — I252 Old myocardial infarction: Secondary | ICD-10-CM | POA: Diagnosis not present

## 2018-09-25 DIAGNOSIS — R296 Repeated falls: Secondary | ICD-10-CM | POA: Diagnosis not present

## 2018-09-25 DIAGNOSIS — Z9181 History of falling: Secondary | ICD-10-CM | POA: Diagnosis not present

## 2018-09-25 DIAGNOSIS — E1140 Type 2 diabetes mellitus with diabetic neuropathy, unspecified: Secondary | ICD-10-CM | POA: Diagnosis not present

## 2018-09-25 DIAGNOSIS — Z951 Presence of aortocoronary bypass graft: Secondary | ICD-10-CM | POA: Diagnosis not present

## 2018-09-25 DIAGNOSIS — G8929 Other chronic pain: Secondary | ICD-10-CM | POA: Diagnosis not present

## 2018-09-25 DIAGNOSIS — I2510 Atherosclerotic heart disease of native coronary artery without angina pectoris: Secondary | ICD-10-CM | POA: Diagnosis not present

## 2018-09-25 DIAGNOSIS — D696 Thrombocytopenia, unspecified: Secondary | ICD-10-CM | POA: Diagnosis not present

## 2018-09-25 DIAGNOSIS — M1991 Primary osteoarthritis, unspecified site: Secondary | ICD-10-CM | POA: Diagnosis not present

## 2018-09-25 DIAGNOSIS — I350 Nonrheumatic aortic (valve) stenosis: Secondary | ICD-10-CM | POA: Diagnosis not present

## 2018-09-25 DIAGNOSIS — Z7982 Long term (current) use of aspirin: Secondary | ICD-10-CM | POA: Diagnosis not present

## 2018-09-30 DIAGNOSIS — Z951 Presence of aortocoronary bypass graft: Secondary | ICD-10-CM | POA: Diagnosis not present

## 2018-09-30 DIAGNOSIS — G8929 Other chronic pain: Secondary | ICD-10-CM | POA: Diagnosis not present

## 2018-09-30 DIAGNOSIS — G2581 Restless legs syndrome: Secondary | ICD-10-CM | POA: Diagnosis not present

## 2018-09-30 DIAGNOSIS — I2510 Atherosclerotic heart disease of native coronary artery without angina pectoris: Secondary | ICD-10-CM | POA: Diagnosis not present

## 2018-09-30 DIAGNOSIS — K219 Gastro-esophageal reflux disease without esophagitis: Secondary | ICD-10-CM | POA: Diagnosis not present

## 2018-09-30 DIAGNOSIS — E1140 Type 2 diabetes mellitus with diabetic neuropathy, unspecified: Secondary | ICD-10-CM | POA: Diagnosis not present

## 2018-09-30 DIAGNOSIS — R296 Repeated falls: Secondary | ICD-10-CM | POA: Diagnosis not present

## 2018-09-30 DIAGNOSIS — D696 Thrombocytopenia, unspecified: Secondary | ICD-10-CM | POA: Diagnosis not present

## 2018-09-30 DIAGNOSIS — Z7982 Long term (current) use of aspirin: Secondary | ICD-10-CM | POA: Diagnosis not present

## 2018-09-30 DIAGNOSIS — I10 Essential (primary) hypertension: Secondary | ICD-10-CM | POA: Diagnosis not present

## 2018-09-30 DIAGNOSIS — M1991 Primary osteoarthritis, unspecified site: Secondary | ICD-10-CM | POA: Diagnosis not present

## 2018-09-30 DIAGNOSIS — E782 Mixed hyperlipidemia: Secondary | ICD-10-CM | POA: Diagnosis not present

## 2018-09-30 DIAGNOSIS — I350 Nonrheumatic aortic (valve) stenosis: Secondary | ICD-10-CM | POA: Diagnosis not present

## 2018-09-30 DIAGNOSIS — I252 Old myocardial infarction: Secondary | ICD-10-CM | POA: Diagnosis not present

## 2018-09-30 DIAGNOSIS — M4807 Spinal stenosis, lumbosacral region: Secondary | ICD-10-CM | POA: Diagnosis not present

## 2018-09-30 DIAGNOSIS — Z9181 History of falling: Secondary | ICD-10-CM | POA: Diagnosis not present

## 2018-10-02 DIAGNOSIS — E782 Mixed hyperlipidemia: Secondary | ICD-10-CM | POA: Diagnosis not present

## 2018-10-02 DIAGNOSIS — E1140 Type 2 diabetes mellitus with diabetic neuropathy, unspecified: Secondary | ICD-10-CM | POA: Diagnosis not present

## 2018-10-02 DIAGNOSIS — I10 Essential (primary) hypertension: Secondary | ICD-10-CM | POA: Diagnosis not present

## 2018-10-02 DIAGNOSIS — G8929 Other chronic pain: Secondary | ICD-10-CM | POA: Diagnosis not present

## 2018-10-02 DIAGNOSIS — M4807 Spinal stenosis, lumbosacral region: Secondary | ICD-10-CM | POA: Diagnosis not present

## 2018-10-02 DIAGNOSIS — I350 Nonrheumatic aortic (valve) stenosis: Secondary | ICD-10-CM | POA: Diagnosis not present

## 2018-10-02 DIAGNOSIS — Z951 Presence of aortocoronary bypass graft: Secondary | ICD-10-CM | POA: Diagnosis not present

## 2018-10-02 DIAGNOSIS — G2581 Restless legs syndrome: Secondary | ICD-10-CM | POA: Diagnosis not present

## 2018-10-02 DIAGNOSIS — D696 Thrombocytopenia, unspecified: Secondary | ICD-10-CM | POA: Diagnosis not present

## 2018-10-02 DIAGNOSIS — Z7982 Long term (current) use of aspirin: Secondary | ICD-10-CM | POA: Diagnosis not present

## 2018-10-02 DIAGNOSIS — I2510 Atherosclerotic heart disease of native coronary artery without angina pectoris: Secondary | ICD-10-CM | POA: Diagnosis not present

## 2018-10-02 DIAGNOSIS — Z9181 History of falling: Secondary | ICD-10-CM | POA: Diagnosis not present

## 2018-10-02 DIAGNOSIS — I252 Old myocardial infarction: Secondary | ICD-10-CM | POA: Diagnosis not present

## 2018-10-02 DIAGNOSIS — M1991 Primary osteoarthritis, unspecified site: Secondary | ICD-10-CM | POA: Diagnosis not present

## 2018-10-02 DIAGNOSIS — K219 Gastro-esophageal reflux disease without esophagitis: Secondary | ICD-10-CM | POA: Diagnosis not present

## 2018-10-02 DIAGNOSIS — R296 Repeated falls: Secondary | ICD-10-CM | POA: Diagnosis not present

## 2018-10-07 DIAGNOSIS — G8929 Other chronic pain: Secondary | ICD-10-CM | POA: Diagnosis not present

## 2018-10-07 DIAGNOSIS — I252 Old myocardial infarction: Secondary | ICD-10-CM | POA: Diagnosis not present

## 2018-10-07 DIAGNOSIS — R296 Repeated falls: Secondary | ICD-10-CM | POA: Diagnosis not present

## 2018-10-07 DIAGNOSIS — Z951 Presence of aortocoronary bypass graft: Secondary | ICD-10-CM | POA: Diagnosis not present

## 2018-10-07 DIAGNOSIS — K219 Gastro-esophageal reflux disease without esophagitis: Secondary | ICD-10-CM | POA: Diagnosis not present

## 2018-10-07 DIAGNOSIS — G2581 Restless legs syndrome: Secondary | ICD-10-CM | POA: Diagnosis not present

## 2018-10-07 DIAGNOSIS — I2510 Atherosclerotic heart disease of native coronary artery without angina pectoris: Secondary | ICD-10-CM | POA: Diagnosis not present

## 2018-10-07 DIAGNOSIS — M1991 Primary osteoarthritis, unspecified site: Secondary | ICD-10-CM | POA: Diagnosis not present

## 2018-10-07 DIAGNOSIS — Z7982 Long term (current) use of aspirin: Secondary | ICD-10-CM | POA: Diagnosis not present

## 2018-10-07 DIAGNOSIS — E1140 Type 2 diabetes mellitus with diabetic neuropathy, unspecified: Secondary | ICD-10-CM | POA: Diagnosis not present

## 2018-10-07 DIAGNOSIS — M4807 Spinal stenosis, lumbosacral region: Secondary | ICD-10-CM | POA: Diagnosis not present

## 2018-10-07 DIAGNOSIS — E782 Mixed hyperlipidemia: Secondary | ICD-10-CM | POA: Diagnosis not present

## 2018-10-07 DIAGNOSIS — D696 Thrombocytopenia, unspecified: Secondary | ICD-10-CM | POA: Diagnosis not present

## 2018-10-07 DIAGNOSIS — I10 Essential (primary) hypertension: Secondary | ICD-10-CM | POA: Diagnosis not present

## 2018-10-07 DIAGNOSIS — I350 Nonrheumatic aortic (valve) stenosis: Secondary | ICD-10-CM | POA: Diagnosis not present

## 2018-10-07 DIAGNOSIS — Z9181 History of falling: Secondary | ICD-10-CM | POA: Diagnosis not present

## 2018-10-13 ENCOUNTER — Ambulatory Visit: Payer: Medicare Other | Admitting: Cardiology

## 2018-10-22 ENCOUNTER — Other Ambulatory Visit: Payer: Self-pay | Admitting: Cardiology

## 2018-10-22 NOTE — Telephone Encounter (Signed)
Patient is unsure of medication and wants you to call him back to discuss

## 2018-10-22 NOTE — Telephone Encounter (Signed)
Lisinopril is not part of pt med list. Left message for patient to call back and verify if he is actually taking enalapril?

## 2018-10-22 NOTE — Telephone Encounter (Signed)
°*  STAT* If patient is at the pharmacy, call can be transferred to refill team.   1. Which medications need to be refilled? (please list name of each medication and dose if known) lisinopril (PRINIVIL,ZESTRIL) 10 MG tablet   2. Which pharmacy/location (including street and city if local pharmacy) is medication to be sent to? [facility] 3. Do they need a 30 day or 90 day supply? [place]

## 2018-10-23 ENCOUNTER — Telehealth: Payer: Self-pay | Admitting: Cardiology

## 2018-10-23 MED ORDER — ENALAPRIL MALEATE 20 MG PO TABS: 20.0000 mg | ORAL_TABLET | Freq: Every day | ORAL | 1 refills | Status: DC

## 2018-10-23 MED ORDER — FUROSEMIDE 40 MG PO TABS: 40.0000 mg | ORAL_TABLET | Freq: Every day | ORAL | 0 refills | Status: DC

## 2018-10-23 NOTE — Telephone Encounter (Signed)
°*  STAT* If patient is at the pharmacy, call can be transferred to refill team.   1. Which medications need to be refilled? (please list name of each medication and dose if known) furosemide (LASIX) 40 MG   2. Which pharmacy/location (including street and city if local pharmacy) is medication to be sent to? [facility] 3. Do they need a 30 day or 90 day supply? 90day

## 2018-10-23 NOTE — Telephone Encounter (Signed)
Refilled lasix 40 mg daily.

## 2018-11-14 ENCOUNTER — Other Ambulatory Visit: Payer: Self-pay

## 2018-11-14 NOTE — Patient Outreach (Signed)
[facility]) Care Management  11/14/2018  [place] W Nyoka Cowden 1930-07-27 [id number]   Medication Adherence call to Mr. Hawkins Darden HIPPA Compliant Voice message left with a call back number. Mr. Alred is showing past due on Rosuvastatin 20 mg under [place].   [place] Management Direct Dial [phone]  Fax [phone] [email or web address]

## 2018-12-09 ENCOUNTER — Other Ambulatory Visit: Payer: Self-pay

## 2018-12-09 NOTE — Patient Outreach (Signed)
[place] [facility]) Care Management  12/09/2018  [name] 09-Mar-1931 [id number]   Medication Adherence call to Mr. Jack Hodges HIPPA Compliant Voice message left with a call back number. Mr. Jack Hodges is showing past due on Rosuvastatin 20 mg under [place].  [place] Management Direct Dial [phone]  Fax [phone] [email or web address]

## 2018-12-10 DIAGNOSIS — I352 Nonrheumatic aortic (valve) stenosis with insufficiency: Secondary | ICD-10-CM | POA: Diagnosis not present

## 2018-12-10 DIAGNOSIS — E782 Mixed hyperlipidemia: Secondary | ICD-10-CM | POA: Diagnosis not present

## 2018-12-10 DIAGNOSIS — K219 Gastro-esophageal reflux disease without esophagitis: Secondary | ICD-10-CM | POA: Diagnosis not present

## 2018-12-10 DIAGNOSIS — I2510 Atherosclerotic heart disease of native coronary artery without angina pectoris: Secondary | ICD-10-CM | POA: Diagnosis not present

## 2018-12-10 DIAGNOSIS — I10 Essential (primary) hypertension: Secondary | ICD-10-CM | POA: Diagnosis not present

## 2018-12-26 ENCOUNTER — Ambulatory Visit: Payer: Medicare Other | Admitting: Cardiology

## 2019-01-19 ENCOUNTER — Ambulatory Visit: Payer: Medicare Other | Admitting: Cardiology

## 2019-02-03 DIAGNOSIS — M75112 Incomplete rotator cuff tear or rupture of left shoulder, not specified as traumatic: Secondary | ICD-10-CM | POA: Diagnosis not present

## 2019-02-03 DIAGNOSIS — Z1159 Encounter for screening for other viral diseases: Secondary | ICD-10-CM | POA: Diagnosis not present

## 2019-02-13 ENCOUNTER — Ambulatory Visit (INDEPENDENT_AMBULATORY_CARE_PROVIDER_SITE_OTHER): Payer: Medicare Other | Admitting: Cardiology

## 2019-02-13 ENCOUNTER — Other Ambulatory Visit: Payer: Self-pay

## 2019-02-13 ENCOUNTER — Encounter: Payer: Self-pay | Admitting: Cardiology

## 2019-02-13 VITALS — BP 120/72 | HR 55 | Ht 64.0 in | Wt 173.6 lb

## 2019-02-13 DIAGNOSIS — I10 Essential (primary) hypertension: Secondary | ICD-10-CM

## 2019-02-13 DIAGNOSIS — Z951 Presence of aortocoronary bypass graft: Secondary | ICD-10-CM

## 2019-02-13 DIAGNOSIS — E785 Hyperlipidemia, unspecified: Secondary | ICD-10-CM

## 2019-02-13 DIAGNOSIS — R0600 Dyspnea, unspecified: Secondary | ICD-10-CM | POA: Diagnosis not present

## 2019-02-13 DIAGNOSIS — I350 Nonrheumatic aortic (valve) stenosis: Secondary | ICD-10-CM

## 2019-02-13 DIAGNOSIS — I2510 Atherosclerotic heart disease of native coronary artery without angina pectoris: Secondary | ICD-10-CM

## 2019-02-13 DIAGNOSIS — R0609 Other forms of dyspnea: Secondary | ICD-10-CM

## 2019-02-13 MED ORDER — EZETIMIBE 10 MG PO TABS: 10.0000 mg | ORAL_TABLET | Freq: Every day | ORAL | 1 refills | Status: DC

## 2019-02-13 NOTE — Progress Notes (Signed)
Cardiology Office Note:    Date:  02/13/2019   ID:  Jack Hodges, DOB [birth year], MRN [id number]  PCP:  Lillard Anes, MD  Cardiologist:  Jenne Campus, MD    Referring MD: Lillard Anes,*   Chief Complaint  Patient presents with  . Follow-up  Doing well but have shortness of breath  History of Present Illness:    Jack Hodges is a 85 y.o. male with coronary disease status post coronary bypass grafting 1996, dyslipidemia, dyspnea on exertion, carotic arterial disease, aortic stenosis.  He comes today to my office for follow-up.  He complains of having shortness of breath but shortness of breath typically happen when he is trying to tie his shoes.  He got difficulty moving around because of bilateral foot drop, he use cane.  Denies having any chest pain, tightness, pressure, burning in the chest.  There is no dizziness no passing out.  Past Medical History:  Diagnosis Date  . Arthritis   . Bilateral foot-drop 01/18/2017  . Coronary artery disease involving native coronary artery of native heart without angina pectoris 11/01/2015   Overview:  Bypass surgery in 1996 Overview:  Overview:  Bypass surgery in [place]:  Continue his current regimen.  Follow up as noted.  . CVA (cerebral vascular accident) ([place]) 04/03/2016   Last Assessment & Plan:  The patient underwent extensive CVA workup.  MRI is negative.  He does have aortic stenosis by echocardiogram.  Upon review of Care everywhere he is followed by Dr. Agustin Cree, cardiologist for this.  Carotid ultrasound negative for hemodynamically significant stenosis.  Lipid panel within normal limits.  He has not had any further episodes of dizziness or nausea vomiting.   . Dementia ([place])   . Depression   . Dizziness 04/19/2016  . DNR (do not resuscitate) 04/03/2016   Overview:  I had an extensive discussion with the patient on 04/03/2016 regarding his end of life wishes.  He and his daughter agree that do  not resuscitate is in keeping with his beliefs.  . Dyslipidemia 11/01/2015   Last Assessment & Plan:  Formatting of this note might be different from the original. Cholesterol 136   Triglycerides  83  HDL  50.1  LDL Calculated  69  VLDL Cholesterol Cal  17   Continue statin.  . Essential hypertension 11/01/2015   Last Assessment & Plan:  Resume home medications.  Follow-up as noted above.  . Hypertension   . Neuropathy   . Sleep apnea   . 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    Past Surgical History:  Procedure Laterality Date  . CARDIAC SURGERY      Current Medications: Current Meds  Medication Sig  . amLODipine (NORVASC) 10 MG tablet Take 10 mg by mouth.  Marland Kitchen aspirin EC 81 MG tablet Take 81 mg by mouth daily.  . enalapril (VASOTEC) 20 MG tablet Take 1 tablet (20 mg total) by mouth daily.  . furosemide (LASIX) 40 MG tablet Take 1 tablet (40 mg total) by mouth daily.  Marland Kitchen gabapentin (NEURONTIN) 300 MG capsule Take 2 capsules by mouth 2 (two) times daily.  Marland Kitchen omeprazole (PRILOSEC) 20 MG capsule Take 20 mg by mouth.  . potassium chloride (K-DUR) 10 MEQ tablet Take 1 tablet (10 mEq total) by mouth daily.  . primidone (MYSOLINE) 50 MG tablet  Take 1 tablet twice a day  . sertraline (ZOLOFT) 50 MG tablet 50 mg daily.  . tamsulosin (FLOMAX) 0.4 MG CAPS capsule as needed.     Allergies:   Patient has no known allergies.   Social History   Socioeconomic History  . Marital status: Married    Spouse name: Not on file  . Number of children: Not on file  . Years of education: Not on file  . Highest education level: Not on file  Occupational History  . Not on file  Social Needs  . Financial resource strain: Not on file  . Food insecurity    Worry: Not on file    Inability: Not on file  . Transportation needs    Medical: Not on file    Non-medical: Not  on file  Tobacco Use  . Smoking status: Former Smoker    Quit date: 05/02/1962    Years since quitting: 56.8  . Smokeless tobacco: Never Used  Substance and Sexual Activity  . Alcohol use: No    Frequency: Never  . Drug use: No  . Sexual activity: Not on file  Lifestyle  . Physical activity    Days per week: Not on file    Minutes per session: Not on file  . Stress: Not on file  Relationships  . Social Musician on phone: Not on file    Gets together: Not on file    Attends religious service: Not on file    Active member of club or organization: Not on file    Attends meetings of clubs or organizations: Not on file    Relationship status: Not on file  Other Topics Concern  . Not on file  Social History Narrative  . Not on file     Family History: The patient's family history includes Arthritis in his brother and father; Cancer in his brother and mother; Diabetes in his mother; Hypertension in his father; Kidney disease in his mother; Migraines in his father; Sleep disorder in his father and mother; Stroke in his mother. ROS:   Please see the history of present illness.    All 14 point review of systems negative except as described per history of present illness  EKGs/Labs/Other Studies Reviewed:      Recent Labs: 07/02/2018: BUN 16; Creatinine, Ser 0.85; Hemoglobin 13.5; NT-Pro BNP 660; Platelets 95; Potassium 3.7; Sodium 143  Recent Lipid Panel No results found for: CHOL, TRIG, HDL, CHOLHDL, VLDL, LDLCALC, LDLDIRECT  Physical Exam:    VS:  BP 120/72   Pulse (!) 55   Ht 5\' 4"  (1.626 m)   Wt 173 lb 9.6 oz (78.7 kg)   SpO2 97%   BMI 29.80 kg/m     Wt Readings from Last 3 Encounters:  02/13/19 173 lb 9.6 oz (78.7 kg)  08/26/18 177 lb 9.6 oz (80.6 kg)  07/16/18 170 lb (77.1 kg)     GEN:  Well nourished, well developed in no acute distress HEENT: Normal NECK: No JVD; No carotid bruits LYMPHATICS: No lymphadenopathy CARDIAC: RRR, no murmurs, no rubs,  no gallops RESPIRATORY:  Clear to auscultation without rales, wheezing or rhonchi  ABDOMEN: Soft, non-tender, non-distended MUSCULOSKELETAL:  No edema; No deformity  SKIN: Warm and dry LOWER EXTREMITIES: no swelling NEUROLOGIC:  Alert and oriented x 3 PSYCHIATRIC:  Normal affect   ASSESSMENT:    1. Nonrheumatic aortic valve stenosis   2. Status post coronary artery bypass graft   3. Dyspnea on  exertion   4. Dyslipidemia   5. Essential hypertension   6. Coronary artery disease involving native coronary artery of native heart without angina pectoris    PLAN:    In order of problems listed above:  1. Nonrheumatic aortic valve stenosis.  It is time to repeat his echocardiogram especially in view of the fact that he does have some exertional shortness of breath.  Luckily, when I am listening to his heart S2 is still present. 2. Status post coronary artery bypass graft stable without any symptoms 3. Dyspnea on exertion again the key test is echocardiogram which we will schedule him to have. 4. Essential hypertension he brought blood pressure measurements from home and also dramatically higher than t we got in our office today.  He tells me that he does have blood pressure monitor on the wrist and also on the shoulder.  Some of this measurements were done by the blood pressure monitor on the wrist and another one on the shoulder.  Ask you to bring your blood pressure monitors to [place] next time so we can check accuracy of this device. 5. Dyslipidemia.  Will call primary care physician to get fasting lipid profile.  He is not on any statin he did have some problem previously with it but in my opinion should have better cholesterol management.  I received cholesterol profile from his primary care physician his LDL is 78 HDL 48 again I initiated discussion about potentially starting cholesterol-lowering medication.  I will put him on Zetia 10.   Medication Adjustments/Labs and Tests Ordered:  Current medicines are reviewed at length with the patient today.  Concerns regarding medicines are outlined above.  No orders of the defined types were placed in this encounter.  Medication changes: No orders of the defined types were placed in this encounter.   Signed, Georgeanna Lea, MD, [facility] 02/13/2019 9:35 AM    [facility]

## 2019-02-13 NOTE — Patient Instructions (Signed)
Medication  Instructions:  Your physician has recommended you make the following change in your medication:   START: Zetia 10 mg daily   *If you need a refill on your cardiac medications before your next appointment, please call your pharmacy*  Lab Work: None.  If you have labs (blood work) drawn today and your tests are completely normal, you will receive your results only by: Marland Kitchen MyChart Message (if you have MyChart) OR . A paper copy in the mail If you have any lab test that is abnormal or we need to change your treatment, we will call you to review the results.  Testing/Procedures: Your physician has requested that you have an echocardiogram. Echocardiography is a painless test that uses sound waves to create images of your heart. It provides your doctor with information about the size and shape of your heart and how well your heart's chambers and valves are working. This procedure takes approximately one hour. There are no restrictions for this procedure.    Follow-Up: At [facility], you and your health needs are our priority.  As part of our continuing mission to provide you with exceptional heart care, we have created designated Provider Care Teams.  These Care Teams include your primary Cardiologist (physician) and Advanced Practice Providers (APPs -  Physician Assistants and Nurse Practitioners) who all work together to provide you with the care you need, when you need it.  Your next appointment:   5 month(s)  The format for your next appointment:   In Person  Provider:   Gypsy Balsam, MD  Other Instructions   Echocardiogram An echocardiogram is a procedure that uses painless sound waves (ultrasound) to produce an image of the heart. Images from an echocardiogram can provide important information about:  Signs of coronary artery disease (CAD).  Aneurysm detection. An aneurysm is a weak or damaged part of an artery wall that bulges out from the normal force of  blood pumping through the body.  Heart size and shape. Changes in the size or shape of the heart can be associated with certain conditions, including heart failure, aneurysm, and CAD.  Heart muscle function.  Heart valve function.  Signs of a past heart attack.  Fluid buildup around the heart.  Thickening of the heart muscle.  A tumor or infectious growth around the heart valves. Tell a health care provider about:  Any allergies you have.  All medicines you are taking, including vitamins, herbs, eye drops, creams, and over-the-counter medicines.  Any blood disorders you have.  Any surgeries you have had.  Any medical conditions you have.  Whether you are pregnant or may be pregnant. What are the risks? Generally, this is a safe procedure. However, problems may occur, including:  Allergic reaction to dye (contrast) that may be used during the procedure. What happens before the procedure? No specific preparation is needed. You may eat and drink normally. What happens during the procedure?   An IV tube may be inserted into one of your veins.  You may receive contrast through this tube. A contrast is an injection that improves the quality of the pictures from your heart.  A gel will be applied to your chest.  A wand-like tool (transducer) will be moved over your chest. The gel will help to transmit the sound waves from the transducer.  The sound waves will harmlessly bounce off of your heart to allow the heart images to be captured in real-time motion. The images will be recorded on a  computer. The procedure may vary among health care providers and hospitals. What happens after the procedure?  You may return to your normal, everyday life, including diet, activities, and medicines, unless your health care provider tells you not to do that. Summary  An echocardiogram is a procedure that uses painless sound waves (ultrasound) to produce an image of the heart.  Images  from an echocardiogram can provide important information about the size and shape of your heart, heart muscle function, heart valve function, and fluid buildup around your heart.  You do not need to do anything to prepare before this procedure. You may eat and drink normally.  After the echocardiogram is completed, you may return to your normal, everyday life, unless your health care provider tells you not to do that. This information is not intended to replace advice given to you by your health care provider. Make sure you discuss any questions you have with your health care provider. Document Released: 03/09/2000 Document Revised: 07/03/2018 Document Reviewed: 04/14/2016 Elsevier Patient Education  [place].  Ezetimibe Tablets What is this medicine? EZETIMIBE (ez ET i mibe) blocks the absorption of cholesterol from the stomach. It can help lower blood cholesterol for patients who are at risk of getting heart disease or a stroke. It is only for patients whose cholesterol level is not controlled by diet. This medicine may be used for other purposes; ask your health care provider or pharmacist if you have questions. COMMON BRAND NAME(S): Zetia What should I tell my health care provider before I take this medicine? They need to know if you have any of these conditions:  liver disease  an unusual or allergic reaction to ezetimibe, medicines, foods, dyes, or preservatives  pregnant or trying to get pregnant  breast-feeding How should I use this medicine? Take this medicine by mouth with a glass of water. Follow the directions on the prescription label. This medicine can be taken with or without food. Take your doses at regular intervals. Do not take your medicine more often than directed. Talk to your pediatrician regarding the use of this medicine in children. Special care may be needed. Overdosage: If you think you have taken too much of this medicine contact a poison control  center or emergency room at once. NOTE: This medicine is only for you. Do not share this medicine with others. What if I miss a dose? If you miss a dose, take it as soon as you can. If it is almost time for your next dose, take only that dose. Do not take double or extra doses. What may interact with this medicine? Do not take this medicine with any of the following medications:  fenofibrate  gemfibrozil This medicine may also interact with the following medications:  antacids  cyclosporine  herbal medicines like red yeast rice  other medicines to lower cholesterol or triglycerides This list may not describe all possible interactions. Give your health care provider a list of all the medicines, herbs, non-prescription drugs, or dietary supplements you use. Also tell them if you smoke, drink alcohol, or use illegal drugs. Some items may interact with your medicine. What should I watch for while using this medicine? Visit your doctor or health care professional for regular checks on your progress. You will need to have your cholesterol levels checked. If you are also taking some other cholesterol medicines, you will also need to have tests to make sure your liver is working properly. Tell your doctor or health care professional  if you get any unexplained muscle pain, tenderness, or weakness, especially if you also have a fever and tiredness. You need to follow a low-cholesterol, low-fat diet while you are taking this medicine. This will decrease your risk of getting heart and blood vessel disease. Exercising and avoiding alcohol and smoking can also help. Ask your doctor or dietician for advice. What side effects may I notice from receiving this medicine? Side effects that you should report to your doctor or health care professional as soon as possible:  allergic reactions like skin rash, itching or hives, swelling of the face, lips, or tongue  dark yellow or brown urine  unusually weak  or tired  yellowing of the skin or eyes Side effects that usually do not require medical attention (report to your doctor or health care professional if they continue or are bothersome):  diarrhea  dizziness  headache  stomach upset or pain This list may not describe all possible side effects. Call your doctor for medical advice about side effects. You may report side effects to FDA at 1-800-FDA-1088. Where should I keep my medicine? Keep out of the reach of children. Store at room temperature between 15 and 30 degrees C (59 and 86 degrees F). Protect from moisture. Keep container tightly closed. Throw away any unused medicine after the expiration date. NOTE: This sheet is a summary. It may not cover all possible information. If you have questions about this medicine, talk to your doctor, pharmacist, or health care provider.  2020 Elsevier/Gold Standard (2011-09-17 15:39:09)

## 2019-02-13 NOTE — Addendum Note (Signed)
Addended by: Ashok Norris on: 02/13/2019 10:01 AM   Modules accepted: Orders

## 2019-04-03 ENCOUNTER — Ambulatory Visit (INDEPENDENT_AMBULATORY_CARE_PROVIDER_SITE_OTHER): Payer: Medicare Other

## 2019-04-03 ENCOUNTER — Other Ambulatory Visit: Payer: Self-pay

## 2019-04-03 DIAGNOSIS — R0600 Dyspnea, unspecified: Secondary | ICD-10-CM | POA: Diagnosis not present

## 2019-04-03 NOTE — Progress Notes (Unsigned)
Complete echocardiogram has been performed.  Jimmy Janie Capp RDCS, RVT 

## 2019-04-09 ENCOUNTER — Other Ambulatory Visit: Payer: Medicare Other

## 2019-04-10 ENCOUNTER — Other Ambulatory Visit: Payer: Self-pay

## 2019-04-10 ENCOUNTER — Other Ambulatory Visit: Payer: Medicare Other

## 2019-04-10 ENCOUNTER — Ambulatory Visit (INDEPENDENT_AMBULATORY_CARE_PROVIDER_SITE_OTHER): Payer: Medicare Other | Admitting: Cardiology

## 2019-04-10 ENCOUNTER — Encounter: Payer: Self-pay | Admitting: Cardiology

## 2019-04-10 VITALS — BP 190/90 | HR 58 | Ht 64.0 in | Wt 171.0 lb

## 2019-04-10 DIAGNOSIS — I2510 Atherosclerotic heart disease of native coronary artery without angina pectoris: Secondary | ICD-10-CM

## 2019-04-10 DIAGNOSIS — Z951 Presence of aortocoronary bypass graft: Secondary | ICD-10-CM | POA: Diagnosis not present

## 2019-04-10 DIAGNOSIS — I739 Peripheral vascular disease, unspecified: Secondary | ICD-10-CM | POA: Diagnosis not present

## 2019-04-10 DIAGNOSIS — I10 Essential (primary) hypertension: Secondary | ICD-10-CM | POA: Diagnosis not present

## 2019-04-10 DIAGNOSIS — I350 Nonrheumatic aortic (valve) stenosis: Secondary | ICD-10-CM | POA: Diagnosis not present

## 2019-04-10 NOTE — Progress Notes (Signed)
Cardiology Office Note:    Date:  04/10/2019   ID:  Jack Hodges, DOB 1930/08/23, MRN [id number]  PCP:  Abigail Miyamoto, MD  Cardiologist:  Gypsy Balsam, MD    Referring MD: Abigail Miyamoto,*   Chief Complaint  Patient presents with  . Follow up from Echo    History of Present Illness:    Jack Hodges is a 86 y.o. male with past medical history significant for coronary artery bypass grafting in 1996, history of CVA, essential hypertension, aortic stenosis.  Comes today to my office to discuss results of his echocardiogram.  What prompted this evaluation is the fact that he started complaining of having some shortness of breath sure and have his echocardiogram showed aortic stenosis which is most likely significant with low flow low gradient stenosis.  He denies having any chest pain he denies having any dizziness shortness of breath is better and is stable.  Past Medical History:  Diagnosis Date  . Arthritis   . Bilateral foot-drop 01/18/2017  . Coronary artery disease involving native coronary artery of native heart without angina pectoris 11/01/2015   Overview:  Bypass surgery in 1996 Overview:  Overview:  Bypass surgery in 1996  Last Assessment & Plan:  Continue his current regimen.  Follow up as noted.  . CVA (cerebral vascular accident) (HCC) 04/03/2016   Last Assessment & Plan:  The patient underwent extensive CVA workup.  MRI is negative.  He does have aortic stenosis by echocardiogram.  Upon review of Care everywhere he is followed by Dr. Bing Matter, cardiologist for this.  Carotid ultrasound negative for hemodynamically significant stenosis.  Lipid panel within normal limits.  He has not had any further episodes of dizziness or nausea vomiting.   . Dementia (HCC)   . Depression   . Dizziness 04/19/2016  . DNR (do not resuscitate) 04/03/2016   Overview:  I had an extensive discussion with the patient on 04/03/2016 regarding his end of life wishes.  He and his  daughter agree that do not resuscitate is in keeping with his beliefs.  . Dyslipidemia 11/01/2015   Last Assessment & Plan:  Formatting of this note might be different from the original. Cholesterol 136   Triglycerides  83  HDL  50.1  LDL Calculated  69  VLDL Cholesterol Cal  17   Continue statin.  . Essential hypertension 11/01/2015   Last Assessment & Plan:  Resume home medications.  Follow-up as noted above.  . Hypertension   . Neuropathy   . Sleep apnea   . 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    Past Surgical History:  Procedure Laterality Date  . CARDIAC SURGERY      Current Medications: Current Meds  Medication Sig  . amLODipine (NORVASC) 10 MG tablet Take 10 mg by mouth.  Marland Kitchen aspirin EC 81 MG tablet Take 81 mg by mouth daily.  . enalapril (VASOTEC) 20 MG tablet Take 1 tablet (20 mg total) by mouth daily.  Marland Kitchen ezetimibe (ZETIA) 10 MG tablet Take 1 tablet (10 mg total) by mouth daily.  . furosemide (LASIX) 40 MG tablet Take 1 tablet (40 mg total) by mouth daily.  Marland Kitchen gabapentin (NEURONTIN) 300 MG capsule Take 2 capsules by mouth 2 (two) times daily.  Marland Kitchen omeprazole (PRILOSEC) 20 MG capsule Take 20 mg by mouth.  . potassium chloride (K-DUR) 10  MEQ tablet Take 1 tablet (10 mEq total) by mouth daily.  . primidone (MYSOLINE) 50 MG tablet Take 1 tablet twice a day  . sertraline (ZOLOFT) 50 MG tablet 50 mg daily.  . tamsulosin (FLOMAX) 0.4 MG CAPS capsule as needed.     Allergies:   Patient has no known allergies.   Social History   Socioeconomic History  . Marital status: Married    Spouse name: Not on file  . Number of children: Not on file  . Years of education: Not on file  . Highest education level: Not on file  Occupational History  . Not on file  Tobacco Use  . Smoking status: Former Smoker    Quit date: 05/02/1962    Years since  quitting: 56.9  . Smokeless tobacco: Never Used  Substance and Sexual Activity  . Alcohol use: No  . Drug use: No  . Sexual activity: Not on file  Other Topics Concern  . Not on file  Social History Narrative  . Not on file   Social Determinants of Health   Financial Resource Strain:   . Difficulty of Paying Living Expenses: Not on file  Food Insecurity:   . Worried About Programme researcher, broadcasting/film/video in the Last Year: Not on file  . Ran Out of Food in the Last Year: Not on file  Transportation Needs:   . Lack of Transportation (Medical): Not on file  . Lack of Transportation (Non-Medical): Not on file  Physical Activity:   . Days of Exercise per Week: Not on file  . Minutes of Exercise per Session: Not on file  Stress:   . Feeling of Stress : Not on file  Social Connections:   . Frequency of Communication with Friends and Family: Not on file  . Frequency of Social Gatherings with Friends and Family: Not on file  . Attends Religious Services: Not on file  . Active Member of Clubs or Organizations: Not on file  . Attends Banker Meetings: Not on file  . Marital Status: Not on file     Family History: The patient's family history includes Arthritis in his brother and father; Cancer in his brother and mother; Diabetes in his mother; Hypertension in his father; Kidney disease in his mother; Migraines in his father; Sleep disorder in his father and mother; Stroke in his mother. ROS:   Please see the history of present illness.    All 14 point review of systems negative except as described per history of present illness  EKGs/Labs/Other Studies Reviewed:   Echocardiogram from April 03, 2019 showed:   1. Left ventricular ejection fraction, by visual estimation, is 60 to 65%. The left ventricle has normal function. There is moderately increased left ventricular hypertrophy.  2. The left ventricle has no regional wall motion abnormalities.  3. Global right ventricle has  normal systolic function.The right ventricular size is normal. No increase in right ventricular wall thickness.  4. Left atrial size was moderately dilated.  5. Right atrial size was normal.  6. The mitral valve is normal in structure. Trivial mitral valve regurgitation. No evidence of mitral stenosis.  7. The tricuspid valve is normal in structure.  8. Aortic valve area, by VTI measures 0.78 cm AVA/BSA = 0.42, DI 0.2, SVI 32.6  9. Aortic valve mean gradient measures 28.0 mmHg. 10. Aortic valve peak gradient measures 47.1 mmHg. 11. The aortic valve is normal in structure. Aortic valve regurgitation is not visualized. Moderate to severe aortic  valve stenosis. 12. The pulmonic valve was normal in structure. Pulmonic valve regurgitation is not visualized. 13. There is mild dilatation of the ascending aorta measuring 38 mm. 14. Normal pulmonary artery systolic pressure. 15. The tricuspid regurgitant velocity is 1.81 m/s, and with an assumed right atrial pressure of 3 mmHg, the estimated right ventricular systolic pressure is normal at 16.1 mmHg. 16. Left ventricular diastolic parameters are consistent with Grade II diastolic dysfunction (pseudonormalization). Recent Labs: 07/02/2018: BUN 16; Creatinine, Ser 0.85; Hemoglobin 13.5; NT-Pro BNP 660; Platelets 95; Potassium 3.7; Sodium 143  Recent Lipid Panel No results found for: CHOL, TRIG, HDL, CHOLHDL, VLDL, LDLCALC, LDLDIRECT  Physical Exam:    VS:  BP (!) 190/90   Pulse (!) 58   Ht 5\' 4"  (1.626 m)   Wt 171 lb (77.6 kg)   SpO2 95%   BMI 29.35 kg/m     Wt Readings from Last 3 Encounters:  04/10/19 171 lb (77.6 kg)  02/13/19 173 lb 9.6 oz (78.7 kg)  08/26/18 177 lb 9.6 oz (80.6 kg)     GEN:  Well nourished, well developed in no acute distress HEENT: Normal NECK: No JVD; No carotid bruits LYMPHATICS: No lymphadenopathy CARDIAC: RRR, systolic ejection murmur grade 3/6 best heard at the right upper portion of the sternum late peaking S2  is still quite pronounced, no rubs, no gallops RESPIRATORY:  Clear to auscultation without rales, wheezing or rhonchi  ABDOMEN: Soft, non-tender, non-distended MUSCULOSKELETAL:  No edema; No deformity  SKIN: Warm and dry LOWER EXTREMITIES: no swelling NEUROLOGIC:  Alert and oriented x 3 PSYCHIATRIC:  Normal affect   ASSESSMENT:    1. Status post coronary artery bypass graft   2. Coronary artery disease involving native coronary artery of native heart without angina pectoris   3. Nonrheumatic aortic valve stenosis   4. Essential hypertension    PLAN:    In order of problems listed above:  1. Status post coronary artery bypass graft stable from that point of view. 2. Aortic stenosis he is echo showed aortic valve area of 1.78 cm, dimensional index was point to SVI was low only 32.6.  Mean gradient 28 mmHg.  I think we dealing with low flow low gradient significant artery stenosis.  I told him that he need to be evaluated for potential aortic valve replacement.  He is very reluctant he simply does not want to do it at this stage.  I told him that doing open sternotomy for somebody with bypass surgery before is rather difficult however now we have an option of going from the groin and doing TAVR.  He promised me to think about it.  I want him about signs and symptoms of worsening of his aortic stenosis which would be more shortness of breath passing out or chest pain.  Also I will see him back in my office in 3 months.  Within next few months we also will repeat his echocardiogram.  I told him that this is the problem to only go to get worse and eventually we need to talk about fixing it.  Honestly I think it will be more time to digest this and then make a decision what he wants to do.  He is DNR. 3. Essential hypertension blood pressure elevated today.  He brought blood pressure measurements from home.  I am reluctant to add more medication to his medical regimen because of his significant  aortic stenosis.  He likely brought some blood pressure measurements from home usually  blood pressure 140/90.   Medication Adjustments/Labs and Tests Ordered: Current medicines are reviewed at length with the patient today.  Concerns regarding medicines are outlined above.  No orders of the defined types were placed in this encounter.  Medication changes: No orders of the defined types were placed in this encounter.   Signed, Park Liter, MD, [facility] 04/10/2019 9:53 AM    [place]

## 2019-04-10 NOTE — Patient Instructions (Signed)
Medication Instructions:  Your physician recommends that you continue on your current medications as directed. Please refer to the Current Medication list given to you today.  *If you need a refill on your cardiac medications before your next appointment, please call your pharmacy*  Lab Work: None.  If you have labs (blood work) drawn today and your tests are completely normal, you will receive your results only by: Marland Kitchen MyChart Message (if you have MyChart) OR . A paper copy in the mail If you have any lab test that is abnormal or we need to change your treatment, we will call you to review the results.  Testing/Procedures: Your physician has requested that you have a carotid duplex. This test is an ultrasound of the carotid arteries in your neck. It looks at blood flow through these arteries that supply the brain with blood. Allow one hour for this exam. There are no restrictions or special instructions.  Your physician has requested that you have a lower extremity arterial exercise duplex. During this test, exercise and ultrasound are used to evaluate arterial blood flow in the legs. Allow one hour for this exam. There are no restrictions or special instructions.   Follow-Up: At [facility], you and your health needs are our priority.  As part of our continuing mission to provide you with exceptional heart care, we have created designated Provider Care Teams.  These Care Teams include your primary Cardiologist (physician) and Advanced Practice Providers (APPs -  Physician Assistants and Nurse Practitioners) who all work together to provide you with the care you need, when you need it.  Your next appointment:   3 month(s)  The format for your next appointment:   In Person  Provider:   Gypsy Balsam, MD  Other Instructions

## 2019-04-13 DIAGNOSIS — M4716 Other spondylosis with myelopathy, lumbar region: Secondary | ICD-10-CM | POA: Diagnosis not present

## 2019-04-13 DIAGNOSIS — E782 Mixed hyperlipidemia: Secondary | ICD-10-CM | POA: Diagnosis not present

## 2019-04-13 DIAGNOSIS — I350 Nonrheumatic aortic (valve) stenosis: Secondary | ICD-10-CM | POA: Diagnosis not present

## 2019-04-13 DIAGNOSIS — I10 Essential (primary) hypertension: Secondary | ICD-10-CM | POA: Diagnosis not present

## 2019-04-13 DIAGNOSIS — I2510 Atherosclerotic heart disease of native coronary artery without angina pectoris: Secondary | ICD-10-CM | POA: Diagnosis not present

## 2019-04-13 DIAGNOSIS — K219 Gastro-esophageal reflux disease without esophagitis: Secondary | ICD-10-CM | POA: Diagnosis not present

## 2019-05-02 ENCOUNTER — Other Ambulatory Visit: Payer: Self-pay | Admitting: Legal Medicine

## 2019-05-19 ENCOUNTER — Ambulatory Visit (INDEPENDENT_AMBULATORY_CARE_PROVIDER_SITE_OTHER): Payer: Medicare Other | Admitting: Legal Medicine

## 2019-05-19 ENCOUNTER — Encounter: Payer: Self-pay | Admitting: Legal Medicine

## 2019-05-19 ENCOUNTER — Other Ambulatory Visit: Payer: Self-pay

## 2019-05-19 VITALS — BP 120/78 | HR 54 | Temp 98.4°F | Resp 17 | Ht 64.0 in | Wt 174.0 lb

## 2019-05-19 DIAGNOSIS — B029 Zoster without complications: Secondary | ICD-10-CM

## 2019-05-19 HISTORY — DX: Zoster without complications: B02.9

## 2019-05-19 MED ORDER — HYDROCODONE-ACETAMINOPHEN 7.5-325 MG/15ML PO SOLN: 15.0000 mL | Freq: Four times a day (QID) | ORAL | 0 refills | Status: DC | PRN

## 2019-05-19 MED ORDER — POTASSIUM CHLORIDE ER 10 MEQ PO TBCR: 10.0000 meq | EXTENDED_RELEASE_TABLET | Freq: Every day | ORAL | 1 refills | Status: DC

## 2019-05-19 MED ORDER — VALACYCLOVIR HCL 1 G PO TABS: 1000.0000 mg | ORAL_TABLET | Freq: Three times a day (TID) | ORAL | 0 refills | Status: DC

## 2019-05-19 MED ORDER — HYDROCODONE-ACETAMINOPHEN 7.5-325 MG PO TABS: 1.0000 | ORAL_TABLET | Freq: Four times a day (QID) | ORAL | 0 refills | Status: DC | PRN

## 2019-05-19 NOTE — Progress Notes (Signed)
Acute Office Visit  Subjective:    Patient ID: Jack Hodges, male    DOB: 1930-12-30, 86 y.o.   MRN: [id number]  Chief Complaint  Patient presents with  . Herpes Zoster    Since 3 days ago. right side of the schest    HPI Patient is in today for acute outbreak of herpes Zoster on right side of chest T9.  It is painful.  He was unable to afford shingles shot in the past.  No fever or chills, no cellulitis.  Past Medical History:  Diagnosis Date  . Arthritis   . Bilateral foot-drop 01/18/2017  . Coronary artery disease involving native coronary artery of native heart without angina pectoris 11/01/2015   Overview:  Bypass surgery in 1996 Overview:  Overview:  Bypass surgery in [place]:  Continue his current regimen.  Follow up as noted.  . CVA (cerebral vascular accident) ([place]) 04/03/2016   Last Assessment & Plan:  The patient underwent extensive CVA workup.  MRI is negative.  He does have aortic stenosis by echocardiogram.  Upon review of Care everywhere he is followed by Dr. Agustin Cree, cardiologist for this.  Carotid ultrasound negative for hemodynamically significant stenosis.  Lipid panel within normal limits.  He has not had any further episodes of dizziness or nausea vomiting.   . Dementia ([place])   . Depression   . Dizziness 04/19/2016  . DNR (do not resuscitate) 04/03/2016   Overview:  I had an extensive discussion with the patient on 04/03/2016 regarding his end of life wishes.  He and his daughter agree that do not resuscitate is in keeping with his beliefs.  . Dyslipidemia 11/01/2015   Last Assessment & Plan:  Formatting of this note might be different from the original. Cholesterol 136   Triglycerides  83  HDL  50.1  LDL Calculated  69  VLDL Cholesterol Cal  17   Continue statin.  . Essential hypertension 11/01/2015   Last Assessment & Plan:  Resume home medications.  Follow-up as noted above.  . Hypertension   . Neuropathy   . Sleep apnea   . Spinal stenosis  of lumbar region with neurogenic claudication 01/18/2017  . 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    Past Surgical History:  Procedure Laterality Date  . CARDIAC SURGERY      Family History  Problem Relation Age of Onset  . Cancer Mother   . Diabetes Mother   . Kidney disease Mother   . Stroke Mother   . Sleep disorder Mother   . Arthritis Father   . Migraines Father   . Hypertension Father   . Sleep disorder Father   . Arthritis Brother   . Cancer Brother     Social History   Socioeconomic History  . Marital status: Widowed    Spouse name: Not on file  . Number of children: 2  . Years of education: Not on file  . Highest education level: Not on file  Occupational History  . Occupation: retired  Tobacco Use  . Smoking status: Former Smoker    Types: Cigarettes, Cigars    Quit date: 05/02/1962    Years since quitting: 57.0  . Smokeless tobacco: Never Used  Substance and Sexual Activity  . Alcohol use: No  . Drug use: No  . Sexual activity: Not Currently  Other Topics Concern  . Not on file  Social History  Narrative  . Not on file   Social Determinants of Health   Financial Resource Strain:   . Difficulty of Paying Living Expenses: Not on file  Food Insecurity:   . Worried About Programme researcher, broadcasting/film/video in the Last Year: Not on file  . Ran Out of Food in the Last Year: Not on file  Transportation Needs:   . Lack of Transportation (Medical): Not on file  . Lack of Transportation (Non-Medical): Not on file  Physical Activity:   . Days of Exercise per Week: Not on file  . Minutes of Exercise per Session: Not on file  Stress:   . Feeling of Stress : Not on file  Social Connections:   . Frequency of Communication with Friends and Family: Not on file  . Frequency of Social Gatherings with Friends and Family: Not on file  . Attends Religious Services: Not on file  . Active Member of Clubs or  Organizations: Not on file  . Attends Banker Meetings: Not on file  . Marital Status: Not on file  Intimate Partner Violence:   . Fear of Current or Ex-Partner: Not on file  . Emotionally Abused: Not on file  . Physically Abused: Not on file  . Sexually Abused: Not on file    Outpatient Medications Prior to Visit  Medication Sig Dispense Refill  . amLODipine (NORVASC) 10 MG tablet Take 10 mg by mouth.    Marland Kitchen aspirin EC 81 MG tablet Take 81 mg by mouth daily.    . enalapril (VASOTEC) 20 MG tablet Take 1 tablet (20 mg total) by mouth daily. 90 tablet 1  . furosemide (LASIX) 40 MG tablet Take 1 tablet (40 mg total) by mouth daily. 90 tablet 0  . gabapentin (NEURONTIN) 300 MG capsule Take 2 capsules by mouth 2 (two) times daily.    . metoprolol tartrate (LOPRESSOR) 25 MG tablet     . omeprazole (PRILOSEC) 20 MG capsule Take 2 capsules (40 mg total) by mouth daily. 180 capsule 2  . primidone (MYSOLINE) 50 MG tablet Take 1 tablet twice a day 60 tablet 6  . rosuvastatin (CRESTOR) 20 MG tablet Take 20 mg by mouth daily.    . sertraline (ZOLOFT) 50 MG tablet 50 mg daily.    . tamsulosin (FLOMAX) 0.4 MG CAPS capsule as needed.    Marland Kitchen HYDROcodone-acetaminophen (NORCO) 7.5-325 MG tablet Take 1 tablet by mouth every 6 (six) hours as needed for moderate pain.    . potassium chloride (K-DUR) 10 MEQ tablet Take 1 tablet (10 mEq total) by mouth daily. 90 tablet 1  . ezetimibe (ZETIA) 10 MG tablet Take 1 tablet (10 mg total) by mouth daily. 90 tablet 1  . traZODone (DESYREL) 100 MG tablet Take 100 mg by mouth at bedtime.     No facility-administered medications prior to visit.    No Known Allergies  Review of Systems  Constitutional: Negative.   HENT: Negative.   Eyes: Positive for discharge.  Respiratory: Negative.   Cardiovascular: Negative.   Gastrointestinal: Negative.   Genitourinary: Negative.   Musculoskeletal: Negative.   Skin: Positive for rash.       Band like vesicular  rash on erytheaous base about T9 on right side of chest  Psychiatric/Behavioral: Negative.        Objective:    Physical Exam Constitutional:      Appearance: Normal appearance.  HENT:     Head: Normocephalic and atraumatic.  Cardiovascular:     Rate  and Rhythm: Normal rate and regular rhythm.     Pulses: Normal pulses.     Heart sounds: Normal heart sounds.  Pulmonary:     Effort: Pulmonary effort is normal.     Breath sounds: Normal breath sounds.  Abdominal:     Palpations: Abdomen is soft.  Musculoskeletal:     Cervical back: Normal range of motion and neck supple.  Skin:    Findings: Erythema, lesion and rash present. Rash is vesicular.          Comments: vesicular rash on right side of chest on erythematous base.  Very tender.  Neurological:     General: No focal deficit present.     Mental Status: He is alert and oriented to person, place, and time. Mental status is at baseline.     BP 120/78 (BP Location: Right Arm, Patient Position: Sitting)   Pulse (!) 54   Temp 98.4 F (36.9 C) (Temporal)   Resp 17   Ht 5\' 4"  (1.626 m)   Wt 174 lb (78.9 kg)   SpO2 94%   BMI 29.87 kg/m  Wt Readings from Last 3 Encounters:  05/19/19 174 lb (78.9 kg)  04/10/19 171 lb (77.6 kg)  02/13/19 173 lb 9.6 oz (78.7 kg)    Health Maintenance Due  Topic Date Due  . TETANUS/TDAP  03/08/1952  . PNA vac Low Risk Adult (1 of 2 - PCV13) 03/08/1998  . INFLUENZA VACCINE  10/25/2018    There are no preventive care reminders to display for this patient.   No results found for: TSH Lab Results  Component Value Date   WBC 6.6 07/02/2018   HGB 13.5 07/02/2018   HCT 39.3 07/02/2018   MCV 90 07/02/2018   PLT 95 (LL) 07/02/2018   Lab Results  Component Value Date   NA 143 07/02/2018   K 3.7 07/02/2018   CO2 21 07/02/2018   GLUCOSE 165 (H) 07/02/2018   BUN 16 07/02/2018   CREATININE 0.85 07/02/2018   CALCIUM 9.1 07/02/2018   No results found for: CHOL No results found for:  HDL No results found for: LDLCALC No results found for: TRIG No results found for: CHOLHDL No results found for: 09/01/2018     Assessment & Plan:   Problem List Items Addressed This Visit      Other   Herpes zoster - Primary    Started on valtrex and hydrocodone for pain.  Warned about transmission of Chicken pox to younger children no vaccinated.  Follow up in one week.      Relevant Medications   valACYclovir (VALTREX) 1000 MG tablet   HYDROcodone-acetaminophen (NORCO) 7.5-325 MG tablet       Meds ordered this encounter  Medications  . potassium chloride (KLOR-CON) 10 MEQ tablet    Sig: Take 1 tablet (10 mEq total) by mouth daily.    Dispense:  90 tablet    Refill:  1  . valACYclovir (VALTREX) 1000 MG tablet    Sig: Take 1 tablet (1,000 mg total) by mouth 3 (three) times daily.    Dispense:  30 tablet    Refill:  0  . DISCONTD: HYDROcodone-acetaminophen (HYCET) 7.5-325 mg/15 ml solution    Sig: Take 15 mLs by mouth 4 (four) times daily as needed for moderate pain.    Dispense:  120 mL    Refill:  0  . HYDROcodone-acetaminophen (NORCO) 7.5-325 MG tablet    Sig: Take 1 tablet by mouth every 6 (six) hours as  needed for moderate pain.    Dispense:  20 tablet    Refill:  0     Brent Bulla, MD

## 2019-05-19 NOTE — Assessment & Plan Note (Signed)
Started on valtrex and hydrocodone for pain.  Warned about transmission of Chicken pox to younger children no vaccinated.  Follow up in one week.

## 2019-05-19 NOTE — Patient Instructions (Signed)
Shingles  Shingles is an infection. It gives you a painful skin rash and blisters that have fluid in them. Shingles is caused by the same germ (virus) that causes chickenpox. Shingles only happens in people who:  Have had chickenpox.  Have been given a shot of medicine (vaccine) to protect against chickenpox. Shingles is rare in this group. The first symptoms of shingles may be itching, tingling, or pain in an area on your skin. A rash will show on your skin a few days or weeks later. The rash is likely to be on one side of your body. The rash usually has a shape like a belt or a band. Over time, the rash turns into fluid-filled blisters. The blisters will break open, change into scabs, and dry up. Medicines may:  Help with pain and itching.  Help you get better sooner.  Help to prevent long-term problems. Follow these instructions at home: Medicines  Take over-the-counter and prescription medicines only as told by your doctor.  Put on an anti-itch cream or numbing cream where you have a rash, blisters, or scabs. Do this as told by your doctor. Helping with itching and discomfort   Put cold, wet cloths (cold compresses) on the area of the rash or blisters as told by your doctor.  Cool baths can help you feel better. Try adding baking soda or dry oatmeal to the water to lessen itching. Do not bathe in hot water. Blister and rash care  Keep your rash covered with a loose bandage (dressing).  Wear loose clothing that does not rub on your rash.  Keep your rash and blisters clean. To do this, wash the area with mild soap and cool water as told by your doctor.  Check your rash every day for signs of infection. Check for: ? More redness, swelling, or pain. ? Fluid or blood. ? Warmth. ? Pus or a bad smell.  Do not scratch your rash. Do not pick at your blisters. To help you to not scratch: ? Keep your fingernails clean and cut short. ? Wear gloves or mittens when you sleep, if  scratching is a problem. General instructions  Rest as told by your doctor.  Keep all follow-up visits as told by your doctor. This is important.  Wash your hands often with soap and water. If soap and water are not available, use hand sanitizer. Doing this lowers your chance of getting a skin infection caused by germs (bacteria).  Your infection can cause chickenpox in people who have never had chickenpox or never got a shot of chickenpox vaccine. If you have blisters that did not change into scabs yet, try not to touch other people or be around other people, especially: ? Babies. ? Pregnant women. ? Children who have areas of red, itchy, or rough skin (eczema). ? Very old people who have transplants. ? People who have a long-term (chronic) sickness, like cancer or AIDS. Contact a doctor if:  Your pain does not get better with medicine.  Your pain does not get better after the rash heals.  You have any signs of infection in the rash area. These signs include: ? More redness, swelling, or pain around the rash. ? Fluid or blood coming from the rash. ? The rash area feeling warm to the touch. ? Pus or a bad smell coming from the rash. Get help right away if:  The rash is on your face or nose.  You have pain in your face or pain by   your eye.  You lose feeling on one side of your face.  You have trouble seeing.  You have ear pain, or you have ringing in your ear.  You have a loss of taste.  Your condition gets worse. Summary  Shingles gives you a painful skin rash and blisters that have fluid in them.  Shingles is an infection. It is caused by the same germ (virus) that causes chickenpox.  Keep your rash covered with a loose bandage (dressing). Wear loose clothing that does not rub on your rash.  If you have blisters that did not change into scabs yet, try not to touch other people or be around people. This information is not intended to replace advice given to you by  your health care provider. Make sure you discuss any questions you have with your health care provider. Document Revised: 07/04/2018 Document Reviewed: 11/14/2016 Elsevier Patient Education  2020 [employer]  

## 2019-05-27 ENCOUNTER — Other Ambulatory Visit: Payer: Self-pay

## 2019-05-27 ENCOUNTER — Encounter: Payer: Self-pay | Admitting: Legal Medicine

## 2019-05-27 ENCOUNTER — Ambulatory Visit (INDEPENDENT_AMBULATORY_CARE_PROVIDER_SITE_OTHER): Payer: Medicare Other | Admitting: Legal Medicine

## 2019-05-27 VITALS — BP 186/100 | HR 74 | Temp 97.7°F | Resp 16 | Ht 64.17 in | Wt 175.0 lb

## 2019-05-27 DIAGNOSIS — B029 Zoster without complications: Secondary | ICD-10-CM | POA: Diagnosis not present

## 2019-05-27 NOTE — Assessment & Plan Note (Signed)
The rash has improved and healing well.  He still is having some paresthesia and we will increase gabapentin to 600mg  tid for 2 weeks

## 2019-05-27 NOTE — Progress Notes (Signed)
Acute Office Visit  Subjective:    Patient ID: Jack Hodges, male    DOB: 11/08/1930, 86 y.o.   MRN: [id number]  Chief Complaint  Patient presents with  . Herpes Zoster    Painful    HPI Patient is in today for acute outbreak of herpes Zoster on right side of chest T9.  It is painful.  He was unable to afford shingles shot in the past.  No fever or chills, no cellulitis. On one week follow up the area is healing well. All vesicles are crusted and dried.  Past Medical History:  Diagnosis Date  . Arthritis   . Bilateral foot-drop 01/18/2017  . Coronary artery disease involving native coronary artery of native heart without angina pectoris 11/01/2015   Overview:  Bypass surgery in 1996 Overview:  Overview:  Bypass surgery in 1996  Last Assessment & Plan:  Continue his current regimen.  Follow up as noted.  . CVA (cerebral vascular accident) (HCC) 04/03/2016   Last Assessment & Plan:  The patient underwent extensive CVA workup.  MRI is negative.  He does have aortic stenosis by echocardiogram.  Upon review of Care everywhere he is followed by Dr. Bing Matter, cardiologist for this.  Carotid ultrasound negative for hemodynamically significant stenosis.  Lipid panel within normal limits.  He has not had any further episodes of dizziness or nausea vomiting.   . Dementia (HCC)   . Depression   . Dizziness 04/19/2016  . DNR (do not resuscitate) 04/03/2016   Overview:  I had an extensive discussion with the patient on 04/03/2016 regarding his end of life wishes.  He and his daughter agree that do not resuscitate is in keeping with his beliefs.  . Dyslipidemia 11/01/2015   Last Assessment & Plan:  Formatting of this note might be different from the original. Cholesterol 136   Triglycerides  83  HDL  50.1  LDL Calculated  69  VLDL Cholesterol Cal  17   Continue statin.  . Essential hypertension 11/01/2015   Last Assessment & Plan:  Resume home medications.  Follow-up as noted above.  . Hypertension   .  Neuropathy   . Sleep apnea   . 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    Past Surgical History:  Procedure Laterality Date  . CARDIAC SURGERY      Family History  Problem Relation Age of Onset  . Cancer Mother   . Diabetes Mother   . Kidney disease Mother   . Stroke Mother   . Sleep disorder Mother   . Arthritis Father   . Migraines Father   . Hypertension Father   . Sleep disorder Father   . Arthritis Brother   . Cancer Brother     Social History   Socioeconomic History  . Marital status: Widowed    Spouse name: Not on file  . Number of children: 2  . Years of education: Not on file  . Highest education level: Not on file  Occupational History  . Occupation: retired  Tobacco Use  . Smoking status: Former Smoker    Types: Cigarettes, Cigars    Quit date: 05/02/1962    Years since quitting: 57.1  . Smokeless tobacco: Never Used  Substance and Sexual Activity  . Alcohol use: No  . Drug use: No  . Sexual activity: Not Currently  Other Topics Concern  .  Not on file  Social History Narrative  . Not on file   Social Determinants of Health   Financial Resource Strain:   . Difficulty of Paying Living Expenses: Not on file  Food Insecurity:   . Worried About Charity fundraiser in the Last Year: Not on file  . Ran Out of Food in the Last Year: Not on file  Transportation Needs:   . Lack of Transportation (Medical): Not on file  . Lack of Transportation (Non-Medical): Not on file  Physical Activity:   . Days of Exercise per Week: Not on file  . Minutes of Exercise per Session: Not on file  Stress:   . Feeling of Stress : Not on file  Social Connections:   . Frequency of Communication with Friends and Family: Not on file  . Frequency of Social Gatherings with Friends and Family: Not on file  . Attends Religious Services: Not  on file  . Active Member of Clubs or Organizations: Not on file  . Attends Archivist Meetings: Not on file  . Marital Status: Not on file  Intimate Partner Violence:   . Fear of Current or Ex-Partner: Not on file  . Emotionally Abused: Not on file  . Physically Abused: Not on file  . Sexually Abused: Not on file    Outpatient Medications Prior to Visit  Medication Sig Dispense Refill  . amLODipine (NORVASC) 10 MG tablet Take 10 mg by mouth.    Marland Kitchen aspirin EC 81 MG tablet Take 81 mg by mouth daily.    . enalapril (VASOTEC) 20 MG tablet Take 1 tablet (20 mg total) by mouth daily. 90 tablet 1  . furosemide (LASIX) 40 MG tablet Take 1 tablet (40 mg total) by mouth daily. 90 tablet 0  . gabapentin (NEURONTIN) 300 MG capsule Take 2 capsules by mouth 2 (two) times daily.    Marland Kitchen HYDROcodone-acetaminophen (NORCO) 7.5-325 MG tablet Take 1 tablet by mouth every 6 (six) hours as needed for moderate pain. 20 tablet 0  . metoprolol tartrate (LOPRESSOR) 25 MG tablet     . omeprazole (PRILOSEC) 20 MG capsule Take 2 capsules (40 mg total) by mouth daily. 180 capsule 2  . potassium chloride (KLOR-CON) 10 MEQ tablet Take 1 tablet (10 mEq total) by mouth daily. 90 tablet 1  . primidone (MYSOLINE) 50 MG tablet Take 1 tablet twice a day 60 tablet 6  . rosuvastatin (CRESTOR) 20 MG tablet Take 20 mg by mouth daily.    . sertraline (ZOLOFT) 50 MG tablet 50 mg daily.    . tamsulosin (FLOMAX) 0.4 MG CAPS capsule as needed.    . traZODone (DESYREL) 100 MG tablet Take 100 mg by mouth at bedtime.    . valACYclovir (VALTREX) 1000 MG tablet Take 1 tablet (1,000 mg total) by mouth 3 (three) times daily. 30 tablet 0  . ezetimibe (ZETIA) 10 MG tablet Take 1 tablet (10 mg total) by mouth daily. 90 tablet 1   No facility-administered medications prior to visit.    No Known Allergies  Review of Systems  Constitutional: Negative.   HENT: Negative.   Eyes: Positive for discharge.  Respiratory: Negative.     Cardiovascular: Negative.   Gastrointestinal: Negative.   Genitourinary: Negative.   Musculoskeletal: Negative.   Skin: Positive for rash.       Band like vesicular rash on erytheaous base about T9 on right side of chest now crusted over  Psychiatric/Behavioral: Negative.  Objective:    Physical Exam Constitutional:      Appearance: Normal appearance.  HENT:     Head: Normocephalic and atraumatic.  Cardiovascular:     Rate and Rhythm: Normal rate and regular rhythm.     Pulses: Normal pulses.     Heart sounds: Normal heart sounds.  Pulmonary:     Effort: Pulmonary effort is normal.     Breath sounds: Normal breath sounds.  Abdominal:     Palpations: Abdomen is soft.  Musculoskeletal:     Cervical back: Normal range of motion and neck supple.  Skin:    Findings: Erythema, lesion and rash present. Rash is vesicular.          Comments: vesicular rash on right side shows vesicles healing well, no drainage  Neurological:     General: No focal deficit present.     Mental Status: He is alert and oriented to person, place, and time. Mental status is at baseline.     BP (!) 186/100 (BP Location: Right Arm, Patient Position: Sitting)   Pulse 74   Temp 97.7 F (36.5 C) (Temporal)   Resp 16   Ht 5' 4.17" (1.63 m)   Wt 175 lb (79.4 kg)   BMI 29.88 kg/m  Wt Readings from Last 3 Encounters:  05/27/19 175 lb (79.4 kg)  05/19/19 174 lb (78.9 kg)  04/10/19 171 lb (77.6 kg)    Health Maintenance Due  Topic Date Due  . TETANUS/TDAP  03/08/1952  . PNA vac Low Risk Adult (2 of 2 - PPSV23) 12/21/2018    There are no preventive care reminders to display for this patient.   No results found for: TSH Lab Results  Component Value Date   WBC 6.6 07/02/2018   HGB 13.5 07/02/2018   HCT 39.3 07/02/2018   MCV 90 07/02/2018   PLT 95 (LL) 07/02/2018   Lab Results  Component Value Date   NA 143 07/02/2018   K 3.7 07/02/2018   CO2 21 07/02/2018   GLUCOSE 165 (H)  07/02/2018   BUN 16 07/02/2018   CREATININE 0.85 07/02/2018   CALCIUM 9.1 07/02/2018   No results found for: CHOL No results found for: HDL No results found for: LDLCALC No results found for: TRIG No results found for: CHOLHDL No results found for: OYDX4J     Assessment & Plan:   Problem List Items Addressed This Visit      Other   Herpes zoster - Primary    The rash has improved and healing well.  He still is having some paresthesia and we will increase gabapentin to 600mg  tid for 2 weeks          No orders of the defined types were placed in this encounter.    , MD

## 2019-05-27 NOTE — Patient Instructions (Signed)
Take gabapentin 2 pills 3 times a day for 2 weeks for pain

## 2019-06-11 ENCOUNTER — Telehealth: Payer: Self-pay

## 2019-06-11 ENCOUNTER — Ambulatory Visit (INDEPENDENT_AMBULATORY_CARE_PROVIDER_SITE_OTHER): Payer: Medicare Other | Admitting: Legal Medicine

## 2019-06-11 ENCOUNTER — Encounter: Payer: Self-pay | Admitting: Legal Medicine

## 2019-06-11 ENCOUNTER — Other Ambulatory Visit: Payer: Self-pay

## 2019-06-11 VITALS — BP 170/90 | HR 52 | Temp 97.5°F | Resp 16 | Ht 64.0 in | Wt 176.0 lb

## 2019-06-11 DIAGNOSIS — B0229 Other postherpetic nervous system involvement: Secondary | ICD-10-CM | POA: Diagnosis not present

## 2019-06-11 DIAGNOSIS — I10 Essential (primary) hypertension: Secondary | ICD-10-CM | POA: Diagnosis not present

## 2019-06-11 MED ORDER — ENALAPRIL MALEATE 20 MG PO TABS: 20.0000 mg | ORAL_TABLET | Freq: Every day | ORAL | 1 refills | Status: DC

## 2019-06-11 MED ORDER — PREGABALIN 75 MG PO CAPS: 75.0000 mg | ORAL_CAPSULE | Freq: Two times a day (BID) | ORAL | 3 refills | Status: DC

## 2019-06-11 MED ORDER — AMLODIPINE BESYLATE 10 MG PO TABS: 10.0000 mg | ORAL_TABLET | Freq: Every day | ORAL | 1 refills | Status: DC

## 2019-06-11 NOTE — Progress Notes (Signed)
Acute Office Visit  Subjective:    Patient ID: Jack Hodges, male    DOB: 1930/11/02, 86 y.o.   MRN: [id number]  Chief Complaint  Patient presents with  . Right Flank Pain    Pain is sharp and radiation to the back    HPI Patient is in today for uncontrolled hypertension.  We reviewed his medicines and he is not taking amlodipine and enalopril.  Patient presents for follow up of hypertension.  Patient tolerating metoprolol and furosemide.doing well without side effects.  Patient was diagnosed with hypertension 2000 so has been treated for hypertension for 20 years.Patient is working on maintaining diet and exercise regimen and follows up as directed. Complication include CAD  He denies eating salt..  Past Medical History:  Diagnosis Date  . Arthritis   . Bilateral foot-drop 01/18/2017  . Coronary artery disease involving native coronary artery of native heart without angina pectoris 11/01/2015   Overview:  Bypass surgery in 1996 Overview:  Overview:  Bypass surgery in 1996  Last Assessment & Plan:  Continue his current regimen.  Follow up as noted.  . CVA (cerebral vascular accident) (HCC) 04/03/2016   Last Assessment & Plan:  The patient underwent extensive CVA workup.  MRI is negative.  He does have aortic stenosis by echocardiogram.  Upon review of Care everywhere he is followed by Dr. Bing Matter, cardiologist for this.  Carotid ultrasound negative for hemodynamically significant stenosis.  Lipid panel within normal limits.  He has not had any further episodes of dizziness or nausea vomiting.   . Dementia (HCC)   . Depression   . Dizziness 04/19/2016  . DNR (do not resuscitate) 04/03/2016   Overview:  I had an extensive discussion with the patient on 04/03/2016 regarding his end of life wishes.  He and his daughter agree that do not resuscitate is in keeping with his beliefs.  . Dyslipidemia 11/01/2015   Last Assessment & Plan:  Formatting of this note might be different from the  original. Cholesterol 136   Triglycerides  83  HDL  50.1  LDL Calculated  69  VLDL Cholesterol Cal  17   Continue statin.  . Essential hypertension 11/01/2015   Last Assessment & Plan:  Resume home medications.  Follow-up as noted above.  . Hypertension   . Neuropathy   . Sleep apnea   . 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    Past Surgical History:  Procedure Laterality Date  . CARDIAC SURGERY      Family History  Problem Relation Age of Onset  . Cancer Mother   . Diabetes Mother   . Kidney disease Mother   . Stroke Mother   . Sleep disorder Mother   . Arthritis Father   . Migraines Father   . Hypertension Father   . Sleep disorder Father   . Arthritis Brother   . Cancer Brother     Social History   Socioeconomic History  . Marital status: Widowed    Spouse name: Not on file  . Number of children: 2  . Years of education: Not on file  . Highest education level: Not on file  Occupational History  . Occupation: retired  Tobacco Use  . Smoking status: Former Smoker    Types: Cigarettes, Cigars    Quit date: 05/02/1962    Years since quitting: 57.1  .  Smokeless tobacco: Never Used  Substance and Sexual Activity  . Alcohol use: No  . Drug use: No  . Sexual activity: Not Currently  Other Topics Concern  . Not on file  Social History Narrative  . Not on file   Social Determinants of Health   Financial Resource Strain:   . Difficulty of Paying Living Expenses:   Food Insecurity:   . Worried About Programme researcher, broadcasting/film/video in the Last Year:   . Barista in the Last Year:   Transportation Needs:   . Freight forwarder (Medical):   Marland Kitchen Lack of Transportation (Non-Medical):   Physical Activity:   . Days of Exercise per Week:   . Minutes of Exercise per Session:   Stress:   . Feeling of Stress :   Social Connections:   .  Frequency of Communication with Friends and Family:   . Frequency of Social Gatherings with Friends and Family:   . Attends Religious Services:   . Active Member of Clubs or Organizations:   . Attends Banker Meetings:   Marland Kitchen Marital Status:   Intimate Partner Violence:   . Fear of Current or Ex-Partner:   . Emotionally Abused:   Marland Kitchen Physically Abused:   . Sexually Abused:     Outpatient Medications Prior to Visit  Medication Sig Dispense Refill  . acetaminophen (TYLENOL) 650 MG CR tablet Take 650 mg by mouth every 8 (eight) hours as needed for pain.    Marland Kitchen aspirin EC 81 MG tablet Take 81 mg by mouth daily.    Marland Kitchen ezetimibe (ZETIA) 10 MG tablet Take 1 tablet (10 mg total) by mouth daily. 90 tablet 1  . furosemide (LASIX) 40 MG tablet Take 1 tablet (40 mg total) by mouth daily. 90 tablet 0  . metoprolol tartrate (LOPRESSOR) 50 MG tablet Take 50 mg by mouth 2 (two) times daily.    Marland Kitchen omeprazole (PRILOSEC) 20 MG capsule Take 2 capsules (40 mg total) by mouth daily. 180 capsule 2  . potassium chloride (KLOR-CON) 10 MEQ tablet Take 1 tablet (10 mEq total) by mouth daily. 90 tablet 1  . primidone (MYSOLINE) 50 MG tablet Take 1 tablet twice a day 60 tablet 6  . rosuvastatin (CRESTOR) 20 MG tablet Take 20 mg by mouth daily.    . sertraline (ZOLOFT) 50 MG tablet 50 mg daily.    . vitamin B-12 (CYANOCOBALAMIN) 500 MCG tablet Take 500 mcg by mouth daily.    Marland Kitchen gabapentin (NEURONTIN) 300 MG capsule Take 2 capsules by mouth 2 (two) times daily.    . pregabalin (LYRICA) 75 MG capsule Take 75 mg by mouth 2 (two) times daily.    . tamsulosin (FLOMAX) 0.4 MG CAPS capsule as needed.    . traZODone (DESYREL) 100 MG tablet Take 100 mg by mouth at bedtime.    Marland Kitchen amLODipine (NORVASC) 10 MG tablet Take 10 mg by mouth.    . enalapril (VASOTEC) 20 MG tablet Take 1 tablet (20 mg total) by mouth daily. (Patient not taking: Reported on 06/11/2019) 90 tablet 1  . HYDROcodone-acetaminophen (NORCO) 7.5-325 MG  tablet Take 1 tablet by mouth every 6 (six) hours as needed for moderate pain. 20 tablet 0  . metoprolol tartrate (LOPRESSOR) 25 MG tablet     . valACYclovir (VALTREX) 1000 MG tablet Take 1 tablet (1,000 mg total) by mouth 3 (three) times daily. 30 tablet 0   No facility-administered medications prior to visit.    No  Known Allergies  Review of Systems  Constitutional: Negative.   HENT: Negative.   Eyes: Negative.   Cardiovascular: Negative.   Gastrointestinal: Negative.   Endocrine: Negative.   Genitourinary: Negative.   Musculoskeletal: Negative.   Skin: Negative.   Neurological: Negative.   Psychiatric/Behavioral: Negative.        Objective:    Physical Exam Vitals reviewed.  Constitutional:      Appearance: Normal appearance.  HENT:     Head: Normocephalic.  Eyes:     Pupils: Pupils are equal, round, and reactive to light.  Cardiovascular:     Rate and Rhythm: Normal rate and regular rhythm.     Pulses: Normal pulses.     Heart sounds: Normal heart sounds.  Pulmonary:     Effort: Pulmonary effort is normal.     Breath sounds: Normal breath sounds.  Abdominal:     General: Abdomen is flat.     Palpations: Abdomen is soft.  Musculoskeletal:     Cervical back: Normal range of motion and neck supple.  Neurological:     General: No focal deficit present.     Mental Status: He is alert and oriented to person, place, and time.     BP (!) 170/90   Pulse (!) 52   Temp (!) 97.5 F (36.4 C) (Temporal)   Resp 16   Ht 5\' 4"  (1.626 m)   Wt 176 lb (79.8 kg)   SpO2 98%   BMI 30.21 kg/m  Wt Readings from Last 3 Encounters:  06/11/19 176 lb (79.8 kg)  05/27/19 175 lb (79.4 kg)  05/19/19 174 lb (78.9 kg)    Health Maintenance Due  Topic Date Due  . TETANUS/TDAP  Never done  . PNA vac Low Risk Adult (2 of 2 - PPSV23) 12/21/2018    There are no preventive care reminders to display for this patient.   No results found for: TSH Lab Results  Component Value  Date   WBC 6.6 07/02/2018   HGB 13.5 07/02/2018   HCT 39.3 07/02/2018   MCV 90 07/02/2018   PLT 95 (LL) 07/02/2018   Lab Results  Component Value Date   NA 143 07/02/2018   K 3.7 07/02/2018   CO2 21 07/02/2018   GLUCOSE 165 (H) 07/02/2018   BUN 16 07/02/2018   CREATININE 0.85 07/02/2018   CALCIUM 9.1 07/02/2018   No results found for: CHOL No results found for: HDL No results found for: LDLCALC No results found for: TRIG No results found for: CHOLHDL No results found for: HGBA1C     Assessment & Plan:   Problem List Items Addressed This Visit      Cardiovascular and Mediastinum   Essential hypertension - Primary    Poor control of BP, he is not taking 2 medicines that was prescribed.  I will restart them and check BP in 2 weeks      Relevant Medications   metoprolol tartrate (LOPRESSOR) 50 MG tablet   enalapril (VASOTEC) 20 MG tablet   amLODipine (NORVASC) 10 MG tablet    Other Visit Diagnoses    Post herpetic neuralgia       Relevant Medications   pregabalin (LYRICA) 75 MG capsule       Meds ordered this encounter  Medications  . enalapril (VASOTEC) 20 MG tablet    Sig: Take 1 tablet (20 mg total) by mouth daily.    Dispense:  90 tablet    Refill:  1  . amLODipine (NORVASC)  10 MG tablet    Sig: Take 1 tablet (10 mg total) by mouth daily.    Dispense:  90 tablet    Refill:  1  . pregabalin (LYRICA) 75 MG capsule    Sig: Take 1 capsule (75 mg total) by mouth 2 (two) times daily.    Dispense:  60 capsule    Refill:  3     Brent Bulla, MD

## 2019-06-11 NOTE — Assessment & Plan Note (Signed)
Poor control of BP, he is not taking 2 medicines that was prescribed.  I will restart them and check BP in 2 weeks

## 2019-06-11 NOTE — Patient Instructions (Addendum)
Take both new pills for blood pressure enalopril and amlodipine once a day Stop gabapentin and start lyrica for shingles pain   Hypertension, Adult Hypertension is another name for high blood pressure. High blood pressure forces your heart to work harder to pump blood. This can cause problems over time. There are two numbers in a blood pressure reading. There is a top number (systolic) over a bottom number (diastolic). It is best to have a blood pressure that is below 120/80. Healthy choices can help lower your blood pressure, or you may need medicine to help lower it. What are the causes? The cause of this condition is not known. Some conditions may be related to high blood pressure. What increases the risk?  Smoking.  Having type 2 diabetes mellitus, high cholesterol, or both.  Not getting enough exercise or physical activity.  Being overweight.  Having too much fat, sugar, calories, or salt (sodium) in your diet.  Drinking too much alcohol.  Having long-term (chronic) kidney disease.  Having a family history of high blood pressure.  Age. Risk increases with age.  Race. You may be at higher risk if you are African American.  Gender. Men are at higher risk than women before age 45. After age 60, women are at higher risk than men.  Having obstructive sleep apnea.  Stress. What are the signs or symptoms?  High blood pressure may not cause symptoms. Very high blood pressure (hypertensive crisis) may cause: ? Headache. ? Feelings of worry or nervousness (anxiety). ? Shortness of breath. ? Nosebleed. ? A feeling of being sick to your stomach (nausea). ? Throwing up (vomiting). ? Changes in how you see. ? Very bad chest pain. ? Seizures. How is this treated?  This condition is treated by making healthy lifestyle changes, such as: ? Eating healthy foods. ? Exercising more. ? Drinking less alcohol.  Your health care provider may prescribe medicine if lifestyle changes  are not enough to get your blood pressure under control, and if: ? Your top number is above 130. ? Your bottom number is above 80.  Your personal target blood pressure may vary. Follow these instructions at home: Eating and drinking   If told, follow the DASH eating plan. To follow this plan: ? Fill one half of your plate at each meal with fruits and vegetables. ? Fill one fourth of your plate at each meal with whole grains. Whole grains include whole-wheat pasta, brown rice, and whole-grain bread. ? Eat or drink low-fat dairy products, such as skim milk or low-fat yogurt. ? Fill one fourth of your plate at each meal with low-fat (lean) proteins. Low-fat proteins include fish, chicken without skin, eggs, beans, and tofu. ? Avoid fatty meat, cured and processed meat, or chicken with skin. ? Avoid pre-made or processed food.  Eat less than 1,500 mg of salt each day.  Do not drink alcohol if: ? Your doctor tells you not to drink. ? You are pregnant, may be pregnant, or are planning to become pregnant.  If you drink alcohol: ? Limit how much you use to:  0-1 drink a day for women.  0-2 drinks a day for men. ? Be aware of how much alcohol is in your drink. In the [place] one drink equals one 12 oz bottle of beer (355 mL), one 5 oz glass of wine (148 mL), or one 1 oz glass of hard liquor (44 mL). Lifestyle   Work with your doctor to stay at a healthy weight  or to lose weight. Ask your doctor what the best weight is for you.  Get at least 30 minutes of exercise most days of the week. This may include walking, swimming, or biking.  Get at least 30 minutes of exercise that strengthens your muscles (resistance exercise) at least 3 days a week. This may include lifting weights or doing Pilates.  Do not use any products that contain nicotine or tobacco, such as cigarettes, e-cigarettes, and chewing tobacco. If you need help quitting, ask your doctor.  Check your blood pressure at home as  told by your doctor.  Keep all follow-up visits as told by your doctor. This is important. Medicines  Take over-the-counter and prescription medicines only as told by your doctor. Follow directions carefully.  Do not skip doses of blood pressure medicine. The medicine does not work as well if you skip doses. Skipping doses also puts you at risk for problems.  Ask your doctor about side effects or reactions to medicines that you should watch for. Contact a doctor if you:  Think you are having a reaction to the medicine you are taking.  Have headaches that keep coming back (recurring).  Feel dizzy.  Have swelling in your ankles.  Have trouble with your vision. Get help right away if you:  Get a very bad headache.  Start to feel mixed up (confused).  Feel weak or numb.  Feel faint.  Have very bad pain in your: ? Chest. ? Belly (abdomen).  Throw up more than once.  Have trouble breathing. Summary  Hypertension is another name for high blood pressure.  High blood pressure forces your heart to work harder to pump blood.  For most people, a normal blood pressure is less than 120/80.  Making healthy choices can help lower blood pressure. If your blood pressure does not get lower with healthy choices, you may need to take medicine. This information is not intended to replace advice given to you by your health care provider. Make sure you discuss any questions you have with your health care provider. Document Revised: 11/20/2017 Document Reviewed: 11/20/2017 Elsevier Patient Education  2020 [employer].

## 2019-06-11 NOTE — Telephone Encounter (Signed)
Patient called for appointment, recently patient diagnosed with shingles, he states it wasn't any better. He was having chest pain and complaining that it hurt inside, like a knot in his chest. Dr. Marina Goodell advised that he take a nitro to see if it relieved the pain. Patient stated it didn't and that he feels that it is the shingles. I informed him if the pain gets and worse to call ems and go to the hospital, as for now he will be coming to our office at 2:30 weather permitting.

## 2019-06-17 ENCOUNTER — Emergency Department (HOSPITAL_COMMUNITY)
Admission: EM | Admit: 2019-06-17 | Discharge: 2019-06-17 | Disposition: A | Discharge status: Home / Self Care | Payer: Medicare Other | Attending: Emergency Medicine | Admitting: Emergency Medicine

## 2019-06-17 ENCOUNTER — Emergency Department (HOSPITAL_COMMUNITY): Payer: Medicare Other

## 2019-06-17 ENCOUNTER — Telehealth: Payer: Self-pay | Admitting: Cardiology

## 2019-06-17 ENCOUNTER — Other Ambulatory Visit: Payer: Self-pay

## 2019-06-17 DIAGNOSIS — R61 Generalized hyperhidrosis: Secondary | ICD-10-CM | POA: Diagnosis not present

## 2019-06-17 DIAGNOSIS — F0390 Unspecified dementia without behavioral disturbance: Secondary | ICD-10-CM | POA: Diagnosis not present

## 2019-06-17 DIAGNOSIS — Z7982 Long term (current) use of aspirin: Secondary | ICD-10-CM | POA: Insufficient documentation

## 2019-06-17 DIAGNOSIS — Z87891 Personal history of nicotine dependence: Secondary | ICD-10-CM | POA: Diagnosis not present

## 2019-06-17 DIAGNOSIS — R109 Unspecified abdominal pain: Secondary | ICD-10-CM | POA: Insufficient documentation

## 2019-06-17 DIAGNOSIS — Z79899 Other long term (current) drug therapy: Secondary | ICD-10-CM | POA: Insufficient documentation

## 2019-06-17 DIAGNOSIS — Z8673 Personal history of transient ischemic attack (TIA), and cerebral infarction without residual deficits: Secondary | ICD-10-CM | POA: Diagnosis not present

## 2019-06-17 DIAGNOSIS — K8020 Calculus of gallbladder without cholecystitis without obstruction: Secondary | ICD-10-CM | POA: Diagnosis not present

## 2019-06-17 DIAGNOSIS — Z951 Presence of aortocoronary bypass graft: Secondary | ICD-10-CM | POA: Insufficient documentation

## 2019-06-17 DIAGNOSIS — I350 Nonrheumatic aortic (valve) stenosis: Secondary | ICD-10-CM | POA: Insufficient documentation

## 2019-06-17 DIAGNOSIS — B0229 Other postherpetic nervous system involvement: Secondary | ICD-10-CM | POA: Diagnosis not present

## 2019-06-17 DIAGNOSIS — R0789 Other chest pain: Secondary | ICD-10-CM | POA: Insufficient documentation

## 2019-06-17 DIAGNOSIS — Z20822 Contact with and (suspected) exposure to covid-19: Secondary | ICD-10-CM | POA: Insufficient documentation

## 2019-06-17 DIAGNOSIS — I10 Essential (primary) hypertension: Secondary | ICD-10-CM | POA: Diagnosis not present

## 2019-06-17 DIAGNOSIS — R079 Chest pain, unspecified: Secondary | ICD-10-CM | POA: Diagnosis not present

## 2019-06-17 LAB — TROPONIN I (HIGH SENSITIVITY)
Troponin I (High Sensitivity): 14 ng/L (ref <18)
Troponin I (High Sensitivity): 17 ng/L (ref <18)

## 2019-06-17 LAB — BRAIN NATRIURETIC PEPTIDE: B Natriuretic Peptide: 154.2 pg/mL — ABNORMAL HIGH (ref 0.0–100.0)

## 2019-06-17 LAB — URINALYSIS, ROUTINE W REFLEX MICROSCOPIC
Bilirubin Urine: NEGATIVE
Glucose, UA: NEGATIVE mg/dL
Hgb urine dipstick: NEGATIVE
Ketones, ur: NEGATIVE mg/dL
Leukocytes,Ua: NEGATIVE
Nitrite: NEGATIVE
Protein, ur: NEGATIVE mg/dL
Specific Gravity, Urine: >1.046 — ABNORMAL HIGH (ref 1.005–1.030)
pH: 5 (ref 5.0–8.0)

## 2019-06-17 LAB — COMPREHENSIVE METABOLIC PANEL
ALT: 12 U/L (ref 0–44)
AST: 17 U/L (ref 15–41)
Albumin: 3.4 g/dL — ABNORMAL LOW (ref 3.5–5.0)
Alkaline Phosphatase: 58 U/L (ref 38–126)
Anion gap: 10 (ref 5–15)
BUN: 12 mg/dL (ref 8–23)
CO2: 23 mmol/L (ref 22–32)
Calcium: 8.9 mg/dL (ref 8.9–10.3)
Chloride: 104 mmol/L (ref 98–111)
Creatinine, Ser: 1.09 mg/dL (ref 0.61–1.24)
GFR calc Af Amer: >60 mL/min (ref >60)
GFR calc non Af Amer: >60 mL/min (ref >60)
Glucose, Bld: 141 mg/dL — ABNORMAL HIGH (ref 70–99)
Potassium: 3.2 mmol/L — ABNORMAL LOW (ref 3.5–5.1)
Sodium: 137 mmol/L (ref 135–145)
Total Bilirubin: 1.4 mg/dL — ABNORMAL HIGH (ref 0.3–1.2)
Total Protein: 6.3 g/dL — ABNORMAL LOW (ref 6.5–8.1)

## 2019-06-17 LAB — CBC WITH DIFFERENTIAL/PLATELET
Abs Immature Granulocytes: 0.04 10*3/uL (ref 0.00–0.07)
Basophils Absolute: 0.1 10*3/uL (ref 0.0–0.1)
Basophils Relative: 1 %
Eosinophils Absolute: 0.2 10*3/uL (ref 0.0–0.5)
Eosinophils Relative: 2 %
HCT: 41 % (ref 39.0–52.0)
Hemoglobin: 13.5 g/dL (ref 13.0–17.0)
Immature Granulocytes: 0 %
Lymphocytes Relative: 21 %
Lymphs Abs: 2.1 10*3/uL (ref 0.7–4.0)
MCH: 30.4 pg (ref 26.0–34.0)
MCHC: 32.9 g/dL (ref 30.0–36.0)
MCV: 92.3 fL (ref 80.0–100.0)
Monocytes Absolute: 0.9 10*3/uL (ref 0.1–1.0)
Monocytes Relative: 9 %
Neutro Abs: 6.7 10*3/uL (ref 1.7–7.7)
Neutrophils Relative %: 67 %
Platelets: 122 10*3/uL — ABNORMAL LOW (ref 150–400)
RBC: 4.44 MIL/uL (ref 4.22–5.81)
RDW: 15.5 % (ref 11.5–15.5)
WBC: 9.9 10*3/uL (ref 4.0–10.5)
nRBC: 0 % (ref 0.0–0.2)

## 2019-06-17 LAB — LIPASE, BLOOD: Lipase: 25 U/L (ref 11–51)

## 2019-06-17 LAB — POC SARS CORONAVIRUS 2 AG -  ED: SARS Coronavirus 2 Ag: NEGATIVE

## 2019-06-17 MED ORDER — IOHEXOL 300 MG/ML  SOLN
100.0000 mL | Freq: Once | INTRAMUSCULAR | Status: AC | PRN
  Administered 2019-06-17: 100 mL via INTRAVENOUS

## 2019-06-17 MED ORDER — HYDROCODONE-ACETAMINOPHEN 5-325 MG PO TABS: 1.0000 | ORAL_TABLET | ORAL | 0 refills | Status: DC | PRN

## 2019-06-17 MED ORDER — SODIUM CHLORIDE 0.9 % IV BOLUS
500.0000 mL | Freq: Once | INTRAVENOUS | Status: AC
  Administered 2019-06-17: 500 mL via INTRAVENOUS

## 2019-06-17 MED ORDER — SODIUM CHLORIDE 0.9% FLUSH
3.0000 mL | Freq: Once | INTRAVENOUS | Status: AC
  Administered 2019-06-17: 13:00:00 3 mL via INTRAVENOUS

## 2019-06-17 MED ORDER — MORPHINE SULFATE (PF) 4 MG/ML IV SOLN
4.0000 mg | Freq: Once | INTRAVENOUS | Status: AC
  Administered 2019-06-17: 4 mg via INTRAVENOUS
  Filled 2019-06-17: qty 1

## 2019-06-17 NOTE — ED Triage Notes (Signed)
Pt arrives via pov with reports of R sided chest/RUQ radiating to the substernal area. Describes pain as a ripping sensation. Hx CABG.

## 2019-06-17 NOTE — Telephone Encounter (Signed)
Good advise.  If he is really sick he needs to go to the emergency room.

## 2019-06-17 NOTE — Consult Note (Addendum)
Cardiology consultation    Patient ID: Jack Hodges MRN: [id number], DOB/AGE: May 17, 1930   Admit date: 06/17/2019  Primary Physician: Lillard Anes, MD Primary Cardiologist: Dr. Agustin Cree, MD  Patient Profile   Jack Hodges is an 86 year old male with a history of CAD s/p CABG in 1996, severe aortic stenosis, CVA, dementia, OSA, hyperlipidemia, hypertension and recent diagnosis of shingles seen by cardiology for the evaluation of chest /postherpetic neuralgia pain.  Past Medical History   Past Medical History:  Diagnosis Date  . Arthritis   . Bilateral foot-drop 01/18/2017  . Coronary artery disease involving native coronary artery of native heart without angina pectoris 11/01/2015   Overview:  Bypass surgery in 1996 Overview:  Overview:  Bypass surgery in [place]:  Continue his current regimen.  Follow up as noted.  . CVA (cerebral vascular accident) ([place]) 04/03/2016   Last Assessment & Plan:  The patient underwent extensive CVA workup.  MRI is negative.  He does have aortic stenosis by echocardiogram.  Upon review of Care everywhere he is followed by Dr. Agustin Cree, cardiologist for this.  Carotid ultrasound negative for hemodynamically significant stenosis.  Lipid panel within normal limits.  He has not had any further episodes of dizziness or nausea vomiting.   . Dementia ([place])   . Depression   . Dizziness 04/19/2016  . DNR (do not resuscitate) 04/03/2016   Overview:  I had an extensive discussion with the patient on 04/03/2016 regarding his end of life wishes.  He and his daughter agree that do not resuscitate is in keeping with his beliefs.  . Dyslipidemia 11/01/2015   Last Assessment & Plan:  Formatting of this note might be different from the original. Cholesterol 136   Triglycerides  83  HDL  50.1  LDL Calculated  69  VLDL Cholesterol Cal  17   Continue statin.  . Essential hypertension 11/01/2015   Last Assessment & Plan:  Resume home medications.   Follow-up as noted above.  . Hypertension   . Neuropathy   . Sleep apnea   . 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    Past Surgical History:  Procedure Laterality Date  . CARDIAC SURGERY      Allergies  No Known Allergies  History of Present Illness    Jack Hodges is an 86 year old male with a history as stated above who was recently diagnosed with shingles 1 month ago and has since been having intermittent, "shocklike" right-sided chest and abdominal pain. Given his shingles diagnosis, he was placed on Valtrex which he finished and has been continued on gabapentin without much relief.  Patient and family report that yesterday, prior to presentation he had uncontrolled chills with worsening chest pain at which time he called his primary cardiologist's office, Dr. Agustin Cree, with the above complaints with recommendations for further evaluation in the ED.  He was given a 500 cc IV fluid bolus and 4 mg of morphine for pain.  Pain felt to be neuropathic/postherpetic neuralgia however concerning given his prior history.  Cardiology has been asked to evaluate given history of CAD and critical AS prior to discharge.  In the ED, EKG shows NSR with first-degree AV block, PVCs and RBBB (present on prior EKG from 04/2017).  CXR with no edema or airspace opacity, postoperative changes and bilateral carotid artery calcifications left greater than right.  Follow-up abdominal/pelvis CT shows no acute intra-abdominal process, cholelithiasis and aortic atherosclerosis.  BNP found to be mildly elevated at 154, creatinine stable at 1.09.  High-sensitivity troponin found to be normal at 14>> 17.  All other lab work appears to be stable.  He was last seen by Dr. Bing Matter on 04/10/2019 in follow-up from his most recent echocardiogram.  Echocardiogram performed 1/8/2021showed  worsening aortic stenosis with a mean gradient of 28 mmHg felt to be low-flow, significant aortic stenosis.  At that time there was discussion regarding potential aortic valve replacement however patient was very reluctant given his age with the thought of another sternotomy. TAVR was also discussed at that time with plans for close follow-up for further discussion. Worsening symptoms were discussed including chest pain, shortness of breath and syncope.  Plan was for follow-up with repeat echocardiogram per Dr. Bing Matter.  Home Medications    Prior to Admission medications   Medication Sig Start Date End Date Taking? Authorizing Provider  acetaminophen (TYLENOL) 650 MG CR tablet Take 650 mg by mouth every 8 (eight) hours as needed for pain.    [provider]  amLODipine (NORVASC) 10 MG tablet Take 1 tablet (10 mg total) by mouth daily. 06/11/19   Abigail Miyamoto, MD  aspirin EC 81 MG tablet Take 81 mg by mouth daily.    [provider]  enalapril (VASOTEC) 20 MG tablet Take 1 tablet (20 mg total) by mouth daily. 06/11/19   Abigail Miyamoto, MD  ezetimibe (ZETIA) 10 MG tablet Take 1 tablet (10 mg total) by mouth daily. 02/13/19 06/11/19  Georgeanna Lea, MD  furosemide (LASIX) 40 MG tablet Take 1 tablet (40 mg total) by mouth daily. 10/23/18 02/13/20  Georgeanna Lea, MD  metoprolol tartrate (LOPRESSOR) 50 MG tablet Take 50 mg by mouth 2 (two) times daily.    [provider]  omeprazole (PRILOSEC) 20 MG capsule Take 2 capsules (40 mg total) by mouth daily. 05/02/19   Abigail Miyamoto, MD  potassium chloride (KLOR-CON) 10 MEQ tablet Take 1 tablet (10 mEq total) by mouth daily. 05/19/19 08/17/19  Abigail Miyamoto, MD  pregabalin (LYRICA) 75 MG capsule Take 1 capsule (75 mg total) by mouth 2 (two) times daily. 06/11/19   Abigail Miyamoto, MD  primidone (MYSOLINE) 50 MG tablet Take 1 tablet twice a day 08/23/17   Van Clines, MD  rosuvastatin  (CRESTOR) 20 MG tablet Take 20 mg by mouth daily. 02/26/19   [provider]  sertraline (ZOLOFT) 50 MG tablet 50 mg daily. 03/29/16   [provider]  tamsulosin (FLOMAX) 0.4 MG CAPS capsule as needed. 04/18/17   [provider]  traZODone (DESYREL) 100 MG tablet Take 100 mg by mouth at bedtime. 11/26/18   [provider]  vitamin B-12 (CYANOCOBALAMIN) 500 MCG tablet Take 500 mcg by mouth daily.    [provider]    Family History    Family History  Problem Relation Age of Onset  . Cancer Mother   . Diabetes Mother   . Kidney disease Mother   . Stroke Mother   . Sleep disorder Mother   . Arthritis Father   . Migraines Father   . Hypertension Father   . Sleep disorder Father   . Arthritis Brother   . Cancer Brother     Social History    Social History   Socioeconomic History  . Marital status: Widowed    Spouse name: Not on file  .  Number of children: 2  . Years of education: Not on file  . Highest education level: Not on file  Occupational History  . Occupation: retired  Tobacco Use  . Smoking status: Former Smoker    Types: Cigarettes, Cigars    Quit date: 05/02/1962    Years since quitting: 57.1  . Smokeless tobacco: Never Used  Substance and Sexual Activity  . Alcohol use: No  . Drug use: No  . Sexual activity: Not Currently  Other Topics Concern  . Not on file  Social History Narrative  . Not on file   Social Determinants of Health   Financial Resource Strain:   . Difficulty of Paying Living Expenses:   Food Insecurity:   . Worried About Programme researcher, broadcasting/film/video in the Last Year:   . Barista in the Last Year:   Transportation Needs:   . Freight forwarder (Medical):   Marland Kitchen Lack of Transportation (Non-Medical):   Physical Activity:   . Days of Exercise per Week:   . Minutes of Exercise per Session:   Stress:   . Feeling of Stress :   Social Connections:   . Frequency of Communication with Friends and  Family:   . Frequency of Social Gatherings with Friends and Family:   . Attends Religious Services:   . Active Member of Clubs or Organizations:   . Attends Banker Meetings:   Marland Kitchen Marital Status:   Intimate Partner Violence:   . Fear of Current or Ex-Partner:   . Emotionally Abused:   Marland Kitchen Physically Abused:   . Sexually Abused:     Review of Systems   General: + chills, myalgias, decreased appetite and fatigue Cardiovascular: Denies chest pain, palpitations. + SOB.  Denies lower extremity swelling.  Respiratory- + SOB.  Denies wheezing, coughing, increased respirations, or sputum production. Gastrointestinal: + abdominal pain and distention. No nausea and vomiting. Reports regular, consistent bowel movements, without signs and symptoms of diarrhea, constipation, or blood in the stools.  Neurological: Denies syncope, falls, numbness, and tingling. No changes in mental status.  All other systems reviewed and are otherwise negative except as noted above.  Physical Exam    Blood pressure 94/77, pulse 80, temperature 98.7 F (37.1 C), temperature source Oral, resp. rate 18, height 5\' 6"  (1.676 m), weight 77.1 kg, SpO2 97 %.   General: Elderly, pleasant, well developed, well nourished, NAD Skin: Warm, dry, intact  Head: Normocephalic, atraumatic, clear, moist mucus membranes. Neck: Positive left for carotid bruits. No JVD Lungs:Clear to ausculation bilaterally. No wheezes, rales, or rhonchi. Breathing is unlabored. Cardiovascular: RRR with S1 S2.  Positive AS murmur Abdomen: Firm, non-tender, distended. No obvious abdominal masses. Extremities: No edema.  Radial pulses 2+ bilaterally Neuro: Alert and oriented. No focal deficits. No facial asymmetry. MAE spontaneously. Psych: Responds to questions appropriately with normal affect.    Labs    Troponin (Point of Care Test) No results for input(s): TROPIPOC in the last 72 hours. No results for input(s): CKTOTAL, CKMB,  TROPONINI in the last 72 hours. Lab Results  Component Value Date   WBC 9.9 06/17/2019   HGB 13.5 06/17/2019   HCT 41.0 06/17/2019   MCV 92.3 06/17/2019   PLT 122 (L) 06/17/2019    Recent Labs  Lab 06/17/19 1201  NA 137  K 3.2*  CL 104  CO2 23  BUN 12  CREATININE 1.09  CALCIUM 8.9  PROT 6.3*  BILITOT 1.4*  ALKPHOS 58  ALT  12  AST 17  GLUCOSE 141*   No results found for: CHOL, HDL, LDLCALC, TRIG No results found for: [facility]   Radiology Studies    DG Chest 2 View  Result Date: 06/17/2019 CLINICAL DATA:  Chest pain EXAM: CHEST - 2 VIEW COMPARISON:  June 04, 2018 FINDINGS: Lungs are clear. Heart size and pulmonary vascularity are normal. No adenopathy. Patient is status post coronary artery bypass grafting. There is aortic atherosclerosis. There is calcification in each carotid artery. No pneumothorax. No bone lesions. IMPRESSION: No edema or airspace opacity. Stable cardiac silhouette. Postoperative changes. Aortic Atherosclerosis (ICD10-I70.0). There is also bilateral carotid artery calcification, more severe on the left than on the right. Electronically Signed   By: Bretta Bang III M.D.   On: 06/17/2019 13:03   CT ABDOMEN PELVIS W CONTRAST  Result Date: 06/17/2019 CLINICAL DATA:  Right upper quadrant pain. EXAM: CT ABDOMEN AND PELVIS WITH CONTRAST TECHNIQUE: Multidetector CT imaging of the abdomen and pelvis was performed using the standard protocol following bolus administration of intravenous contrast. CONTRAST:  OMNIPAQUE IOHEXOL 300 MG/ML  SOLN COMPARISON:  Abdominal ultrasound dated Aug 20, 2013. FINDINGS: Lower chest: No acute abnormality. Hepatobiliary: No focal liver abnormality. 1.9 cm gallstone. No gallbladder wall thickening or biliary dilatation. Pancreas: Unremarkable. No pancreatic ductal dilatation or surrounding inflammatory changes. Spleen: Normal in size without focal abnormality. Adrenals/Urinary Tract: Adrenal glands are unremarkable. Mild  bilateral perinephric stranding. No renal mass, calculi, or hydronephrosis. The bladder is largely decompressed. Stomach/Bowel: Stomach is within normal limits. Appendix appears normal. No evidence of bowel wall thickening, distention, or inflammatory changes. Left-sided colonic diverticulosis. Vascular/Lymphatic: Aortic atherosclerosis. Retroaortic left renal vein. No enlarged abdominal or pelvic lymph nodes. Reproductive: Mild prostatomegaly. Other: No free fluid or pneumoperitoneum. Musculoskeletal: No acute or significant osseous findings. Advanced thoracolumbar spondylosis. L3 hemangioma. IMPRESSION: 1. No acute intra-abdominal process. 2. Cholelithiasis. 3. Aortic Atherosclerosis (ICD10-I70.0). Electronically Signed   By: Obie Dredge M.D.   On: 06/17/2019 13:53   ECG & Cardiac Imaging    Echocardiogram 04/03/2019:  1. Left ventricular ejection fraction, by visual estimation, is 60 to 65%. The left ventricle has normal function. There is moderately increased left ventricular hypertrophy. 2. The left ventricle has no regional wall motion abnormalities. 3. Global right ventricle has normal systolic function.The right ventricular size is normal. No increase in right ventricular wall thickness. 4. Left atrial size was moderately dilated. 5. Right atrial size was normal. 6. The mitral valve is normal in structure. Trivial mitral valve regurgitation. No evidence of mitral stenosis. 7. The tricuspid valve is normal in structure. 8. Aortic valve area, by VTI measures 0.78 cm AVA/BSA = 0.42, DI 0.2, SVI 32.6 9. Aortic valve mean gradient measures 28.0 mmHg. 10. Aortic valve peak gradient measures 47.1 mmHg. 11. The aortic valve is normal in structure. Aortic valve regurgitation is not visualized. Moderate to severe aortic valve stenosis. 12. The pulmonic valve was normal in structure. Pulmonic valve regurgitation is not visualized. 13. There is mild dilatation of the ascending aorta  measuring 38 mm. 14. Normal pulmonary artery systolic pressure. 15. The tricuspid regurgitant velocity is 1.81 m/s, and with an assumed right atrial pressure of 3 mmHg, the estimated right ventricular systolic pressure is normal at 16.1 mmHg. 16. Left ventricular diastolic parameters are consistent with Grade II diastolic dysfunction (pseudonormalization).  Assessment & Plan    1. CAD s/p CABG in 1998: -Recently diagnosed with shingles and has been having intermittent mid sternal chest and abdominal pain  for approximately 1 month duration. Given his shingles diagnosis, he was placed on Valtrex which he finished and has continued on gabapentin. Patient and family report that yesterday, he became diaphoretic with worsening chest pain at which time he called his primary cardiologist's office, Dr. Bing Matter, with the above complaints with recommendations for further evaluation in the ED. Pain felt to be neuropathic/postherpetic neuralgia however concerning given his prior history.  -EKG shows NSR with first-degree AV block, PVCs and RBBB (present on prior EKG from 04/2017).   -CXR with no edema or airspace opacity, postoperative changes and bilateral carotid artery calcifications left greater than right.  -CT shows no acute intra-abdominal process, cholelithiasis and aortic atherosclerosis.   -BNP found to be mildly elevated at 154, creatinine stable at 1.09.   -High-sensitivity troponin found to be normal at 14>> 17 -PTA medications include ASA 81, metoprolol tartrate 50 mg>>> would hold antihypertensives secondary to hypotension at this time.  Continue with IV fluid hydration -No further cardiac work-up at this time. Chest pain felt to be posttraumatic neuralgia/neuropathic pain.  We will plan for possible hospitalist admission for greater pain control and plan for close follow-up with his primary cardiologist, Dr. Bing Matter for further discussion regarding his aortic stenosis.  Shortness of breath has  been persistent however unchanged, likely in the setting of his progressive AS.  Does not have typical anginal symptoms and in the setting of a negative EKG with negative troponins, will continue plan as above.  2. Non-rheumatic aortic valve stenosis: -Recent follow-up with Dr. Bing Matter 04/10/2019 to discuss most recent echocardiogram which showed worsening aortic stenosis with a mean gradient of 28 mmHg felt to be low-flow, significant aortic stenosis.  At that time there was discussion regarding potential aortic valve replacement however patient was very reluctant given his age with the thought of another sternotomy. TAVR was also discussed at that time with plans for close follow-up for further discussion. Worsening symptoms were discussed including chest pain, shortness of breath and syncope.  Plan was for follow-up with repeat echocardiogram per Dr. Bing Matter. -Seen today by Dr. Clifton James who will plan to follow for pre-TAVR work-up if patient and Dr. Bing Matter agree  3. Essential hypertension>> currently with hypotension: -Stable, 94/77, 93/57, 93/57, 100/83 -Denies symptoms such as dizziness or fatigue -PTA meds include amlodipine 10, metoprolol tartrate 50 mg twice daily -Continue to hold antihypertensive medications due to soft BPs -Received 500 cc IV fluid bolus on ED presentation  4. Recent diagnosis of shingles: -Has been treated with full course of Valtrex and continues on gabapentin 300 mg p.o. daily -Current pain symptoms felt to be secondary to postherpetic neuralgia as he describes initiation of right sided mid axillary pain initiation which he describes as a " lightening or shock like" pain  -Will need further pain management for above symptoms   Signed, Georgie Chard NP-C [facility] Pager: [phone] 06/17/2019, @[facility]   I have personally seen and examined this patient. I agree with the assessment and plan as outlined above.  Jack Hodges is a pleasant 86 yo male with history of  CAD s/p CABG, prior CVA, HTN, HLD and severe aortic stenosis with recent shingles who presented to the ED with chest pain for one month. This pain is the same as his ongoing pain during the treatment of his shingles. He has no exertional worsening of chest pain. It extends from his right lateral chest wall to the sternum with sharp flashes of pain. Baseline exertional dyspnea. He is known to have normal LV  function. Most recent echo January 2021 per Dr. Bing Matter with likely paradoxical low flow, low gradient AS. He has been unwilling to consider evaluation for AVR vs TAVR.  I have reviewed his EKG today and it shows not ischemic changes. Sinus rhythm.  Labs reviewed by me. Troponin negative.  I personally reviewed the echo images from January 2021.  My exam:  General: Well developed, well nourished, NAD  HEENT: OP clear, mucus membranes moist  SKIN: warm, dry. No rashes. Neuro: No focal deficits  Musculoskeletal: Muscle strength 5/5 all ext  Psychiatric: Mood and affect normal  Neck: No JVD, no carotid bruits, no thyromegaly, no lymphadenopathy.  Lungs:Clear bilaterally, no wheezes, rhonci, crackles Cardiovascular: Regular rate and rhythm. Loud, harsh systolic murmur.  Abdomen:Soft. Bowel sounds present. Non-tender.  Extremities: No lower extremity edema. Pulses are 2 + in the bilateral DP/PT.  Chest Pain: His chest pain is not cardiac. It is most likely related to his recent shingles outbreak. No high risk features.  Severe AS: I have spent 20 minutes to day reviewing AS and treatment options. He is not ready to commit to a TAVR workup but I think he is more confident now that it is a valid option. He would like to see Dr. Bing Matter  back in a month and have an echo then as planned. He will let us know if he wishes to move forward with TAVR at that time and we can proceed with the workup.   OK to d/c home from the ED without further cardiac workup.  Pain management per ED.   Verne Carrow  06/17/2019 4:32 PM

## 2019-06-17 NOTE — ED Notes (Signed)
Pt. Transported to CT 

## 2019-06-17 NOTE — ED Provider Notes (Signed)
[facility] EMERGENCY DEPARTMENT Provider Note   CSN: [id number] Arrival date & time: 06/17/19  1137     History Chief Complaint  Patient presents with  . Chest Pain    Jack Hodges is a 86 y.o. male history of CAD, aortic stenosis, here presenting with chest pain.  Patient states that he has been having substernal chest pain that is ongoing for about a month now.  He was diagnosed with shingles about a month ago and finished Valtrex and is currently on gabapentin. He states that he has persistent pain on the abdomen as well as the chest for the last month.  He states that yesterday he became diaphoretic and has worsening chest pain.  He called his cardiologist today because he has worsening chest pain and abdominal pain.  The history is provided by the patient.       Past Medical History:  Diagnosis Date  . Arthritis   . Bilateral foot-drop 01/18/2017  . Coronary artery disease involving native coronary artery of native heart without angina pectoris 11/01/2015   Overview:  Bypass surgery in 1996 Overview:  Overview:  Bypass surgery in [place]:  Continue his current regimen.  Follow up as noted.  . CVA (cerebral vascular accident) ([place]) 04/03/2016   Last Assessment & Plan:  The patient underwent extensive CVA workup.  MRI is negative.  He does have aortic stenosis by echocardiogram.  Upon review of Care everywhere he is followed by Dr. Agustin Cree, cardiologist for this.  Carotid ultrasound negative for hemodynamically significant stenosis.  Lipid panel within normal limits.  He has not had any further episodes of dizziness or nausea vomiting.   . Dementia ([place])   . Depression   . Dizziness 04/19/2016  . DNR (do not resuscitate) 04/03/2016   Overview:  I had an extensive discussion with the patient on 04/03/2016 regarding his end of life wishes.  He and his daughter agree that do not resuscitate is in keeping with his beliefs.  . Dyslipidemia 11/01/2015     Last Assessment & Plan:  Formatting of this note might be different from the original. Cholesterol 136   Triglycerides  83  HDL  50.1  LDL Calculated  69  VLDL Cholesterol Cal  17   Continue statin.  . Essential hypertension 11/01/2015   Last Assessment & Plan:  Resume home medications.  Follow-up as noted above.  . Hypertension   . Neuropathy   . Sleep apnea   . 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    Patient Active Problem List   Diagnosis Date Noted  . Herpes zoster 05/19/2019  . Dyspnea on exertion 06/30/2018  . Aortic stenosis 05/02/2017  . Bilateral foot-drop 01/18/2017  . 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 Dizziness 04/19/2016  . CVA (cerebral vascular accident) ([place]) 04/03/2016  . DNR (do not resuscitate) 04/03/2016  . Coronary artery disease involving native coronary artery of native heart without angina pectoris 11/01/2015  . Dyslipidemia 11/01/2015  . Essential hypertension 11/01/2015  . Status post coronary artery bypass graft 11/01/2015    Past Surgical History:  Procedure Laterality Date  . CARDIAC SURGERY         Family History  Problem Relation Age of Onset  . Cancer Mother   . Diabetes Mother   . Kidney disease  Mother   . Stroke Mother   . Sleep disorder Mother   . Arthritis Father   . Migraines Father   . Hypertension Father   . Sleep disorder Father   . Arthritis Brother   . Cancer Brother     Social History   Tobacco Use  . Smoking status: Former Smoker    Types: Cigarettes, Cigars    Quit date: 05/02/1962    Years since quitting: 57.1  . Smokeless tobacco: Never Used  Substance Use Topics  . Alcohol use: No  . Drug use: No    Home Medications Prior to Admission medications   Medication Sig Start Date End Date Taking? Authorizing Provider  acetaminophen  (TYLENOL) 650 MG CR tablet Take 650 mg by mouth every 8 (eight) hours as needed for pain.    [provider]  amLODipine (NORVASC) 10 MG tablet Take 1 tablet (10 mg total) by mouth daily. 06/11/19   Abigail Miyamoto, MD  aspirin EC 81 MG tablet Take 81 mg by mouth daily.    [provider]  enalapril (VASOTEC) 20 MG tablet Take 1 tablet (20 mg total) by mouth daily. 06/11/19   Abigail Miyamoto, MD  ezetimibe (ZETIA) 10 MG tablet Take 1 tablet (10 mg total) by mouth daily. 02/13/19 06/11/19  Georgeanna Lea, MD  furosemide (LASIX) 40 MG tablet Take 1 tablet (40 mg total) by mouth daily. 10/23/18 02/13/20  Georgeanna Lea, MD  metoprolol tartrate (LOPRESSOR) 50 MG tablet Take 50 mg by mouth 2 (two) times daily.    [provider]  omeprazole (PRILOSEC) 20 MG capsule Take 2 capsules (40 mg total) by mouth daily. 05/02/19   Abigail Miyamoto, MD  potassium chloride (KLOR-CON) 10 MEQ tablet Take 1 tablet (10 mEq total) by mouth daily. 05/19/19 08/17/19  Abigail Miyamoto, MD  pregabalin (LYRICA) 75 MG capsule Take 1 capsule (75 mg total) by mouth 2 (two) times daily. 06/11/19   Abigail Miyamoto, MD  primidone (MYSOLINE) 50 MG tablet Take 1 tablet twice a day 08/23/17   Van Clines, MD  rosuvastatin (CRESTOR) 20 MG tablet Take 20 mg by mouth daily. 02/26/19   [provider]  sertraline (ZOLOFT) 50 MG tablet 50 mg daily. 03/29/16   [provider]  tamsulosin (FLOMAX) 0.4 MG CAPS capsule as needed. 04/18/17   [provider]  traZODone (DESYREL) 100 MG tablet Take 100 mg by mouth at bedtime. 11/26/18   [provider]  vitamin B-12 (CYANOCOBALAMIN) 500 MCG tablet Take 500 mcg by mouth daily.    [provider]    Allergies    Poison oak extract  Review of Systems   Review of Systems  Cardiovascular: Positive for chest pain.  All other systems reviewed and are negative.   Physical Exam Updated Vital  Signs BP 94/77 (BP Location: Left Arm)   Pulse 80   Temp 98.7 F (37.1 C) (Oral)   Resp 18   Ht 5\' 6"  (1.676 m)   Wt 77.1 kg   SpO2 97%   BMI 27.44 kg/m   Physical Exam Vitals and nursing note reviewed.  Constitutional:      Appearance: He is well-developed.  HENT:     Head: Normocephalic.  Eyes:     Pupils: Pupils are equal, round, and reactive to light.  Cardiovascular:     Rate and Rhythm: Normal rate and regular rhythm.     Comments: Systolic murmur  Pulmonary:  Effort: Pulmonary effort is normal.     Breath sounds: Normal breath sounds.     Comments: No reproducible tenderness, he has vesicles that has already crusted over and has already healed.  He has no open wounds. Chest:     Chest wall: No deformity.  Abdominal:     Comments: + RUQ tenderness   Musculoskeletal:        General: Normal range of motion.     Cervical back: Normal range of motion and neck supple.  Skin:    General: Skin is warm.     Capillary Refill: Capillary refill takes less than 2 seconds.  Neurological:     General: No focal deficit present.     Mental Status: He is alert and oriented to person, place, and time.  Psychiatric:        Mood and Affect: Mood normal.        Behavior: Behavior normal.     ED Results / Procedures / Treatments   Labs (all labs ordered are listed, but only abnormal results are displayed) Labs Reviewed  URINALYSIS, ROUTINE W REFLEX MICROSCOPIC - Abnormal; Notable for the following components:      Result Value   Specific Gravity, Urine >1.046 (*)    All other components within normal limits  CBC WITH DIFFERENTIAL/PLATELET - Abnormal; Notable for the following components:   Platelets 122 (*)    All other components within normal limits  COMPREHENSIVE METABOLIC PANEL - Abnormal; Notable for the following components:   Potassium 3.2 (*)    Glucose, Bld 141 (*)    Total Protein 6.3 (*)    Albumin 3.4 (*)    Total Bilirubin 1.4 (*)    All other components  within normal limits  BRAIN NATRIURETIC PEPTIDE - Abnormal; Notable for the following components:   B Natriuretic Peptide 154.2 (*)    All other components within normal limits  LIPASE, BLOOD  POC SARS CORONAVIRUS 2 AG -  ED  TROPONIN I (HIGH SENSITIVITY)  TROPONIN I (HIGH SENSITIVITY)    EKG EKG Interpretation  Date/Time:  Wednesday June 17 2019 11:38:59 EDT Ventricular Rate:  87 PR Interval:  228 QRS Duration: 146 QT Interval:  410 QTC Calculation: 493 R Axis:   101 Text Interpretation: Sinus rhythm with 1st degree A-V block with Premature supraventricular complexes Right bundle branch block Septal infarct , age undetermined Abnormal ECG No previous ECGs available Confirmed by Richardean Canal ([id number]) on 06/17/2019 11:58:55 AM   Radiology DG Chest 2 View  Result Date: 06/17/2019 CLINICAL DATA:  Chest pain EXAM: CHEST - 2 VIEW COMPARISON:  June 04, 2018 FINDINGS: Lungs are clear. Heart size and pulmonary vascularity are normal. No adenopathy. Patient is status post coronary artery bypass grafting. There is aortic atherosclerosis. There is calcification in each carotid artery. No pneumothorax. No bone lesions. IMPRESSION: No edema or airspace opacity. Stable cardiac silhouette. Postoperative changes. Aortic Atherosclerosis (ICD10-I70.0). There is also bilateral carotid artery calcification, more severe on the left than on the right. Electronically Signed   By: Bretta Bang III M.D.   On: 06/17/2019 13:03   CT ABDOMEN PELVIS W CONTRAST  Result Date: 06/17/2019 CLINICAL DATA:  Right upper quadrant pain. EXAM: CT ABDOMEN AND PELVIS WITH CONTRAST TECHNIQUE: Multidetector CT imaging of the abdomen and pelvis was performed using the standard protocol following bolus administration of intravenous contrast. CONTRAST:  OMNIPAQUE IOHEXOL 300 MG/ML  SOLN COMPARISON:  Abdominal ultrasound dated Aug 20, 2013. FINDINGS: Lower chest: No  acute abnormality. Hepatobiliary: No focal liver  abnormality. 1.9 cm gallstone. No gallbladder wall thickening or biliary dilatation. Pancreas: Unremarkable. No pancreatic ductal dilatation or surrounding inflammatory changes. Spleen: Normal in size without focal abnormality. Adrenals/Urinary Tract: Adrenal glands are unremarkable. Mild bilateral perinephric stranding. No renal mass, calculi, or hydronephrosis. The bladder is largely decompressed. Stomach/Bowel: Stomach is within normal limits. Appendix appears normal. No evidence of bowel wall thickening, distention, or inflammatory changes. Left-sided colonic diverticulosis. Vascular/Lymphatic: Aortic atherosclerosis. Retroaortic left renal vein. No enlarged abdominal or pelvic lymph nodes. Reproductive: Mild prostatomegaly. Other: No free fluid or pneumoperitoneum. Musculoskeletal: No acute or significant osseous findings. Advanced thoracolumbar spondylosis. L3 hemangioma. IMPRESSION: 1. No acute intra-abdominal process. 2. Cholelithiasis. 3. Aortic Atherosclerosis (ICD10-I70.0). Electronically Signed   By: Obie Dredge M.D.   On: 06/17/2019 13:53    Procedures Procedures (including critical care time)  Medications Ordered in ED Medications  sodium chloride 0.9 % bolus 500 mL (has no administration in time range)  sodium chloride flush (NS) 0.9 % injection 3 mL (3 mLs Intravenous Given 06/17/19 1239)  morphine 4 MG/ML injection 4 mg (4 mg Intravenous Given 06/17/19 1239)  iohexol (OMNIPAQUE) 300 MG/ML solution 100 mL (100 mLs Intravenous Contrast Given 06/17/19 1329)    ED Course  I have reviewed the triage vital signs and the nursing notes.  Pertinent labs & imaging results that were available during my care of the patient were reviewed by me and considered in my medical decision making (see chart for details).    MDM Rules/Calculators/A&P                      Jack Hodges is a 86 y.o. male who presented with chest pain abdominal pain.  I think his pain is likely from his shingles.  I  think it is likely neuropathic pain and post herpetic neuralgia.  On the other hand, he is CAD with multiple stents and also has aortic stenosis.  Will get labs and chest x-ray and CT abdomen pelvis.  Will get troponin and consult cardiology.  3:07 PM Labs unremarkable.  His CT abdomen pelvis is unremarkable. Trop neg x 1. I think likely post herpetic neuralgia. Given that patient has hx of CAD and critical AS, consulted cardiology to see patient. Signed out to Dr. Rubin Payor in the ED to follow up cardiology.   Final Clinical Impression(s) / ED Diagnoses Final diagnoses:  None    Rx / DC Orders ED Discharge Orders    None       Charlynne Pander, MD 06/17/19 [phone]

## 2019-06-17 NOTE — Telephone Encounter (Signed)
Jack Hodges the patient's son is calling stating Dr. Bing Matter advised him to be admitted in the hospital the other day and he did not go. Jack Hodges states Jack Hodges is now feeling worse and is ready to go to the hospital. He is wanting to know if Dr. Bing Matter could reach out to [facility] and get the Jack Hodges a bed before they arrive. Please advise.

## 2019-06-17 NOTE — ED Notes (Signed)
Patient verbalizes understanding of discharge instructions. Opportunity for questioning and answers were provided. Armband removed by staff, pt discharged from ED in wheelchair to home.   

## 2019-06-17 NOTE — Telephone Encounter (Signed)
Spoke with son and his dad asked him last night to call Dr Bing Matter this morning and get him a bed at [facility]. Explained no recent visit from Dr Bing Matter advising him on admission. Son stated patient recently diagnosed with shingles and having lots of pain mid chest with shortness of breath  Per son he is "going down hill" and last night having chills. Unsure of fever or any other symptoms, patient currently sleeping. Advised son if father felt poorly enough he felt he needed to go to ED for evaluation to take him when he gets up. Will forward to Dr Bing Matter for review

## 2019-06-17 NOTE — Telephone Encounter (Signed)
Called spoke to son per dpr. Patient is at [facility] emergency department now.

## 2019-06-25 ENCOUNTER — Ambulatory Visit (INDEPENDENT_AMBULATORY_CARE_PROVIDER_SITE_OTHER): Payer: Medicare Other | Admitting: Legal Medicine

## 2019-06-25 ENCOUNTER — Telehealth: Payer: Self-pay | Admitting: Legal Medicine

## 2019-06-25 ENCOUNTER — Other Ambulatory Visit: Payer: Self-pay

## 2019-06-25 ENCOUNTER — Encounter: Payer: Self-pay | Admitting: Legal Medicine

## 2019-06-25 VITALS — BP 138/84 | HR 79 | Temp 98.0°F | Wt 174.6 lb

## 2019-06-25 DIAGNOSIS — R296 Repeated falls: Secondary | ICD-10-CM

## 2019-06-25 DIAGNOSIS — I10 Essential (primary) hypertension: Secondary | ICD-10-CM

## 2019-06-25 HISTORY — DX: Repeated falls: R29.6

## 2019-06-25 NOTE — Chronic Care Management (AMB) (Signed)
  Chronic Care Management   Note  06/25/2019 Name: Jack Hodges MRN: [id number] DOB: 16-Mar-1931  Jack Hodges is a 86 y.o. year old male who is a primary care patient of Abigail Miyamoto, MD. I reached out to Thaison Starlyn Skeans by phone today in response to a referral sent by Jack Hodges's PCP, Abigail Miyamoto, MD.   Jack Hodges was given information about Chronic Care Management services today including:  1. CCM service includes personalized support from designated clinical staff supervised by his physician, including individualized plan of care and coordination with other care providers 2. 24/7 contact phone numbers for assistance for urgent and routine care needs. 3. Service will only be billed when office clinical staff spend 20 minutes or more in a month to coordinate care. 4. Only one practitioner may furnish and bill the service in a calendar month. 5. The patient may stop CCM services at any time (effective at the end of the month) by phone call to the office staff.   Patient agreed to services and verbal consent obtained.   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

## 2019-06-25 NOTE — Assessment & Plan Note (Signed)
An individual hypertension care plan was established and reinforced today.  The patient's status was assessed using clinical findings on exam and labs or diagnostic tests. The patient's success at meeting treatment goals on disease specific evidence-based guidelines and found to be improved controlled. He is on all medicines SELF MANAGEMENT: The patient and I together assessed ways to personally work towards obtaining the recommended goals. RECOMMENDATIONS: avoid decongestants found in common cold remedies, decrease consumption of alcohol, perform routine monitoring of BP with home BP cuff, exercise, reduction of dietary salt, take medicines as prescribed, try not to miss doses and quit smoking.  Regular exercise and maintaining a healthy weight is needed.  Stress reduction may help. A CLINICAL SUMMARY including written plan identify barriers to care unique to individual due to social or financial issues.  We attempt to mutually creat solutions for individual and family understanding.

## 2019-06-25 NOTE — Progress Notes (Signed)
Established Patient Office Visit  Subjective:  Patient ID: Jack Hodges, male    DOB: July 13, 1930  Age: 86 y.o. MRN: [id number]  CC:  Chief Complaint  Patient presents with  . Hypertension  . Herpes Zoster    HPI Jack Hodges presents for follow up of hypertension  Patient presents for follow up of hypertension.  Patient tolerating amlodipine, metoprolol 50mg , vasotec well with side effects.  Patient was diagnosed with hypertension 2010 so has been treated for hypertension for 10 years.Patient is working on maintaining diet and exercise regimen and follows up as directed. Complication include CAD. Last BP 170/90, He restarted his medicines.   Herpes zoster: Pain is improving.  Past Medical History:  Diagnosis Date  . Arthritis   . Bilateral foot-drop 01/18/2017  . Coronary artery disease involving native coronary artery of native heart without angina pectoris 11/01/2015   Overview:  Bypass surgery in 1996 Overview:  Overview:  Bypass surgery in 1996  Last Assessment & Plan:  Continue his current regimen.  Follow up as noted.  . CVA (cerebral vascular accident) (HCC) 04/03/2016   Last Assessment & Plan:  The patient underwent extensive CVA workup.  MRI is negative.  He does have aortic stenosis by echocardiogram.  Upon review of Care everywhere he is followed by Dr. 06/01/2016, cardiologist for this.  Carotid ultrasound negative for hemodynamically significant stenosis.  Lipid panel within normal limits.  He has not had any further episodes of dizziness or nausea vomiting.   . Dementia (HCC)   . Depression   . Dizziness 04/19/2016  . DNR (do not resuscitate) 04/03/2016   Overview:  I had an extensive discussion with the patient on 04/03/2016 regarding his end of life wishes.  He and his daughter agree that do not resuscitate is in keeping with his beliefs.  . Dyslipidemia 11/01/2015   Last Assessment & Plan:  Formatting of this note might be different from the original. Cholesterol 136    Triglycerides  83  HDL  50.1  LDL Calculated  69  VLDL Cholesterol Cal  17   Continue statin.  . Essential hypertension 11/01/2015   Last Assessment & Plan:  Resume home medications.  Follow-up as noted above.  . Hypertension   . Neuropathy   . Sleep apnea   . 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    Past Surgical History:  Procedure Laterality Date  . CARDIAC SURGERY      Family History  Problem Relation Age of Onset  . Cancer Mother   . Diabetes Mother   . Kidney disease Mother   . Stroke Mother   . Sleep disorder Mother   . Arthritis Father   . Migraines Father   . Hypertension Father   . Sleep disorder Father   . Arthritis Brother   . Cancer Brother     Social History   Socioeconomic History  . Marital status: Widowed    Spouse name: Not on file  . Number of children: 2  . Years of education: Not on file  . Highest education level: Not on file  Occupational History  . Occupation: retired  Tobacco Use  . Smoking status: Former Smoker    Types: Cigarettes, Cigars    Quit date: 05/02/1962    Years since quitting: 57.1  . Smokeless tobacco: Never Used  Substance and Sexual Activity  .  Alcohol use: No  . Drug use: No  . Sexual activity: Not Currently  Other Topics Concern  . Not on file  Social History Narrative  . Not on file   Social Determinants of Health   Financial Resource Strain:   . Difficulty of Paying Living Expenses:   Food Insecurity:   . Worried About Programme researcher, broadcasting/film/video in the Last Year:   . Barista in the Last Year:   Transportation Needs:   . Freight forwarder (Medical):   Marland Kitchen Lack of Transportation (Non-Medical):   Physical Activity:   . Days of Exercise per Week:   . Minutes of Exercise per Session:   Stress:   . Feeling of Stress :   Social Connections:   . Frequency of Communication  with Friends and Family:   . Frequency of Social Gatherings with Friends and Family:   . Attends Religious Services:   . Active Member of Clubs or Organizations:   . Attends Banker Meetings:   Marland Kitchen Marital Status:   Intimate Partner Violence:   . Fear of Current or Ex-Partner:   . Emotionally Abused:   Marland Kitchen Physically Abused:   . Sexually Abused:     Outpatient Medications Prior to Visit  Medication Sig Dispense Refill  . acetaminophen (TYLENOL) 650 MG CR tablet Take 650 mg by mouth every 8 (eight) hours as needed for pain.    Marland Kitchen amLODipine (NORVASC) 10 MG tablet Take 1 tablet (10 mg total) by mouth daily. 90 tablet 1  . aspirin EC 81 MG tablet Take 81 mg by mouth daily.    . enalapril (VASOTEC) 20 MG tablet Take 1 tablet (20 mg total) by mouth daily. 90 tablet 1  . furosemide (LASIX) 40 MG tablet Take 1 tablet (40 mg total) by mouth daily. (Patient taking differently: Take 40 mg by mouth daily as needed for fluid or edema. ) 90 tablet 0  . gabapentin (NEURONTIN) 300 MG capsule Take 300 mg by mouth in the morning.    Marland Kitchen HYDROcodone-acetaminophen (NORCO/VICODIN) 5-325 MG tablet Take 1-2 tablets by mouth every 4 (four) hours as needed. 8 tablet 0  . metoprolol tartrate (LOPRESSOR) 50 MG tablet Take 50 mg by mouth 2 (two) times daily.    Marland Kitchen omeprazole (PRILOSEC) 20 MG capsule Take 2 capsules (40 mg total) by mouth daily. (Patient taking differently: Take 20 mg by mouth daily before breakfast. ) 180 capsule 2  . potassium chloride (KLOR-CON) 10 MEQ tablet Take 1 tablet (10 mEq total) by mouth daily. 90 tablet 1  . pregabalin (LYRICA) 75 MG capsule Take 1 capsule (75 mg total) by mouth 2 (two) times daily. 60 capsule 3  . primidone (MYSOLINE) 50 MG tablet Take 1 tablet twice a day (Patient taking differently: Take 50 mg by mouth daily as needed (to stabilize the mood). ) 60 tablet 6  . rosuvastatin (CRESTOR) 20 MG tablet Take 20 mg by mouth at bedtime.     . sertraline (ZOLOFT) 50 MG  tablet Take 50 mg by mouth in the morning.     . vitamin B-12 (CYANOCOBALAMIN) 500 MCG tablet Take 500 mcg by mouth daily.    Marland Kitchen ezetimibe (ZETIA) 10 MG tablet Take 1 tablet (10 mg total) by mouth daily. (Patient taking differently: Take 10 mg by mouth at bedtime. ) 90 tablet 1   No facility-administered medications prior to visit.    Allergies  Allergen Reactions  . Lyrica [Pregabalin] Other (See Comments)    "  Made me feel drunk, so I stopped taking it"  . Poison Oak Extract Rash    ROS Review of Systems  Constitutional: Negative.   Eyes: Negative.   Respiratory: Negative.   Cardiovascular: Negative.   Gastrointestinal: Negative.   Endocrine: Negative.   Genitourinary: Negative.   Musculoskeletal: Negative.   Skin: Negative.   Neurological: Negative.       Objective:    Physical Exam  Constitutional: He appears well-developed and well-nourished.  HENT:  Head: Normocephalic and atraumatic.  Cardiovascular: Normal rate and regular rhythm.  Murmur heard. Pulmonary/Chest: Effort normal and breath sounds normal.  Abdominal: Soft. Bowel sounds are normal.  Musculoskeletal:        General: Normal range of motion.     Cervical back: Normal range of motion and neck supple.  Neurological: He is alert.  Skin: Skin is warm.  Psychiatric: He has a normal mood and affect.  Vitals reviewed.   BP 138/84   Pulse 79   Temp 98 F (36.7 C)   Wt 174 lb 9.6 oz (79.2 kg)   SpO2 96%   BMI 28.18 kg/m  Wt Readings from Last 3 Encounters:  06/25/19 174 lb 9.6 oz (79.2 kg)  06/17/19 170 lb (77.1 kg)  06/11/19 176 lb (79.8 kg)     Health Maintenance Due  Topic Date Due  . TETANUS/TDAP  Never done  . PNA vac Low Risk Adult (2 of 2 - PPSV23) 12/21/2018    There are no preventive care reminders to display for this patient.  No results found for: TSH Lab Results  Component Value Date   WBC 9.9 06/17/2019   HGB 13.5 06/17/2019   HCT 41.0 06/17/2019   MCV 92.3 06/17/2019    PLT 122 (L) 06/17/2019   Lab Results  Component Value Date   NA 137 06/17/2019   K 3.2 (L) 06/17/2019   CO2 23 06/17/2019   GLUCOSE 141 (H) 06/17/2019   BUN 12 06/17/2019   CREATININE 1.09 06/17/2019   BILITOT 1.4 (H) 06/17/2019   ALKPHOS 58 06/17/2019   AST 17 06/17/2019   ALT 12 06/17/2019   PROT 6.3 (L) 06/17/2019   ALBUMIN 3.4 (L) 06/17/2019   CALCIUM 8.9 06/17/2019   ANIONGAP 10 06/17/2019   No results found for: CHOL No results found for: HDL No results found for: LDLCALC No results found for: TRIG No results found for: CHOLHDL No results found for: KPTW6F    Assessment & Plan:   Problem List Items Addressed This Visit      Cardiovascular and Mediastinum   Essential hypertension - Primary    An individual hypertension care plan was established and reinforced today.  The patient's status was assessed using clinical findings on exam and labs or diagnostic tests. The patient's success at meeting treatment goals on disease specific evidence-based guidelines and found to be improved controlled. He is on all medicines SELF MANAGEMENT: The patient and I together assessed ways to personally work towards obtaining the recommended goals. RECOMMENDATIONS: avoid decongestants found in common cold remedies, decrease consumption of alcohol, perform routine monitoring of BP with home BP cuff, exercise, reduction of dietary salt, take medicines as prescribed, try not to miss doses and quit smoking.  Regular exercise and maintaining a healthy weight is needed.  Stress reduction may help. A CLINICAL SUMMARY including written plan identify barriers to care unique to individual due to social or financial issues.  We attempt to mutually creat solutions for individual and family understanding.  Other   Recurrent falls    He has foot drop and uses cane for balance.  He will not use walker.  Fall prevention discussed and he was given information of fall prevention.         No  orders of the defined types were placed in this encounter. adult dTap order from pharmacy  Follow-up: Return in about 3 months (around 09/24/2019) for fasting.    [name], MD

## 2019-06-25 NOTE — Assessment & Plan Note (Signed)
He has foot drop and uses cane for balance.  He will not use walker.  Fall prevention discussed and he was given information of fall prevention.

## 2019-06-25 NOTE — Patient Instructions (Signed)

## 2019-07-03 ENCOUNTER — Other Ambulatory Visit: Payer: Self-pay

## 2019-07-03 ENCOUNTER — Other Ambulatory Visit: Payer: Self-pay | Admitting: Legal Medicine

## 2019-07-03 DIAGNOSIS — I10 Essential (primary) hypertension: Secondary | ICD-10-CM

## 2019-07-03 DIAGNOSIS — I2510 Atherosclerotic heart disease of native coronary artery without angina pectoris: Secondary | ICD-10-CM

## 2019-07-03 DIAGNOSIS — E785 Hyperlipidemia, unspecified: Secondary | ICD-10-CM

## 2019-07-03 NOTE — Progress Notes (Signed)
Appt has been scheduled for 07/06/2019 with Sara Beth Brown, PharmD. 

## 2019-07-03 NOTE — Chronic Care Management (AMB) (Signed)
Chronic Care Management Pharmacy  Name: Wilferd ELAM ELLIS  MRN: [id number] DOB: [birth year]  Chief Complaint/ HPI  Kyan Starlyn Skeans,  86 y.o. , male presents for their Initial CCM visit with the clinical pharmacist In office.  PCP : Abigail Miyamoto, MD  Their chronic conditions include: CAD, hx of CVA, HTN, Spinal stenosis, and aortic stenosis.   Office Visits: 06/25/2019 - routine follow-up. dTap ordered from Pharmacy.  06/11/2019- Patient had stopped taking amlodipine and enalapril. Stopped gabapentin and start Lyrica for shingles pain.   05/27/2019 - Gabapentin for shingles pain.  05/19/2019 - Started on valtrex and hydrocodone for shingles. 04/13/2019 - no anemia, chronically low platelets but stable, normal kidney and liver test, good cholesterol panel. 02/03/2019 - steroid injection for shoulder.    Consult Visit: 06/17/2019 - ED visit for chest pain. Ruled out cardiac origin. Likely post herpetic neuralgia.  04/10/2019 - artery stenosis recommended. BP elevated in office. 140/90 per patient at home. No med changes at this time.  02/13/2019 - Dr. Kirtland Bouchard would like patient on a statin for optimal management.     Medications: Outpatient Encounter Medications as of 07/06/2019  Medication Sig Note  . amLODipine (NORVASC) 10 MG tablet Take 1 tablet (10 mg total) by mouth daily.   Marland Kitchen aspirin EC 81 MG tablet Take 81 mg by mouth daily.   . enalapril (VASOTEC) 20 MG tablet Take 1 tablet (20 mg total) by mouth daily.   Marland Kitchen ezetimibe (ZETIA) 10 MG tablet Take 1 tablet (10 mg total) by mouth daily. (Patient taking differently: Take 10 mg by mouth at bedtime. )   . meclizine (ANTIVERT) 25 MG tablet Take 25 mg by mouth 3 (three) times daily as needed for dizziness.   Marland Kitchen omeprazole (PRILOSEC) 20 MG capsule Take 2 capsules (40 mg total) by mouth daily. (Patient taking differently: Take 20 mg by mouth daily before breakfast. )   . potassium chloride (KLOR-CON) 10 MEQ tablet Take 1 tablet (10 mEq  total) by mouth daily.   . pregabalin (LYRICA) 75 MG capsule Take 1 capsule (75 mg total) by mouth 2 (two) times daily.   . primidone (MYSOLINE) 50 MG tablet Take 1 tablet twice a day (Patient taking differently: Take 50 mg by mouth daily as needed (to stabilize the mood). ) 06/17/2019: Patient stated he takes this "when he feel down"  . rosuvastatin (CRESTOR) 20 MG tablet Take 20 mg by mouth at bedtime.    . sertraline (ZOLOFT) 50 MG tablet Take 50 mg by mouth in the morning.    . vitamin B-12 (CYANOCOBALAMIN) 500 MCG tablet Take 500 mcg by mouth daily.   . [DISCONTINUED] furosemide (LASIX) 40 MG tablet Take 1 tablet (40 mg total) by mouth daily. (Patient taking differently: Take 40 mg by mouth daily as needed for fluid or edema. )   . [DISCONTINUED] metoprolol tartrate (LOPRESSOR) 50 MG tablet Take 50 mg by mouth 2 (two) times daily.   Marland Kitchen acetaminophen (TYLENOL) 650 MG CR tablet Take 650 mg by mouth every 8 (eight) hours as needed for pain.   Marland Kitchen gabapentin (NEURONTIN) 300 MG capsule TAKE 2 CAPSULES BY MOUTH  TWO TIMES DAILY (Patient not taking: No sig reported)   . HYDROcodone-acetaminophen (NORCO/VICODIN) 5-325 MG tablet Take 1-2 tablets by mouth every 4 (four) hours as needed. (Patient not taking: Reported on 07/06/2019)    No facility-administered encounter medications on file as of 07/06/2019.   Allergies  Allergen Reactions  . Lyrica [Pregabalin] Other (See  Comments)    "Made me feel drunk, so I stopped taking it"  . Poison Oak Extract Rash    SDOH Screenings   Alcohol Screen:   . Last Alcohol Screening Score (AUDIT):   Depression (PHQ2-9):   . PHQ-2 Score:   Financial Resource Strain:   . Difficulty of Paying Living Expenses:   Food Insecurity: No Food Insecurity  . Worried About Programme researcher, broadcasting/film/video in the Last Year: Never true  . Ran Out of Food in the Last Year: Never true  Housing:   . Last Housing Risk Score:   Physical Activity:   . Days of Exercise per Week:   . Minutes  of Exercise per Session:   Social Connections:   . Frequency of Communication with Friends and Family:   . Frequency of Social Gatherings with Friends and Family:   . Attends Religious Services:   . Active Member of Clubs or Organizations:   . Attends Banker Meetings:   Marland Kitchen Marital Status:   Stress:   . Feeling of Stress :   Tobacco Use: Medium Risk  . Smoking Tobacco Use: Former Smoker  . Smokeless Tobacco Use: Never Used  Transportation Needs: No Transportation Needs  . Lack of Transportation (Medical): No  . Lack of Transportation (Non-Medical): No     Current Diagnosis/Assessment:  Goals Addressed            This Visit's Progress   . Pharmacy Care Plan       CARE PLAN ENTRY  Current Barriers:  . Chronic Disease Management support, education, and care coordination needs related to HTN and HLD  Pharmacist Clinical Goal(s):  . Ensure safety, efficacy, and affordability of medications . Recommend maintaining BP <140/90 mmHg  . Recommend keeping sodium intake less than 1500 mg/day.  . Goal Cholesterol: TC <200, LDL <100, TG <150 HDL >50 mg/dL   Interventions: . Pharmacist coordinated refill of metoprolol and furosemide at local pharmacy. Patient to pickup and resume taking today.  Marland Kitchen Pharmacist coordinating refills of maintenance medications with mail-order pharmacy for greatest cost savings. Recommend continue to take medications as prescribed daily.  Marland Kitchen Pharmacist provided patient with updated medication list.  Patient Self Care Activities:  . Recommend engaging in moderate intensity exercise 30 minutes 5 times each week as tolerated.  . Recommend following the Dash diet guidance of incorporating fruits, vegetables, whole-grains, low-fat dairy products, lean meats, legumes and nuts for optimal health. . Recommend patient continue to check blood pressure daily for next month and provide for Cardiologist.   Initial goal documentation         Hypertension   BP today is:  <140/90  Office blood pressures are  BP Readings from Last 3 Encounters:  06/25/19 138/84  06/17/19 116/66  06/11/19 (!) 170/90   Lab Results  Component Value Date   CREATININE 1.09 06/17/2019   CREATININE 0.85 07/02/2018   CREATININE 0.88 04/28/2018     Patient has failed these meds in the past: hctz 25 mg daily Patient is currently controlled on the following medications: amlodipine 10 mg daily, enalapril 20 mg daily, furosemide 40 mg daily, metoprolol tartrate 50 mg bid, Potassium Chloride daily Patient checks BP at home twice daily  Patient home BP readings are ranging: 133/73 pulse 68  We discussed Medication and medication adherence. Patient ran out of metoprolol and furosemide. We now have him refills at the pharmacy to pickup. He states that his blood pressure has been much better on  his medications. He is checking twice daily at home.   Plan  Continue current medications   CAD/Hx of CVA/Dyslipidemia   Lipid Panel  No results found for: CHOL, TRIG, HDL, CHOLHDL, VLDL, LDLCALC, LDLDIRECT, LABVLDL   The ASCVD Risk score Mikey Bussing DC Jr., et al., 2013) failed to calculate for the following reasons:   The 2013 ASCVD risk score is only valid for ages 20 to 35   The patient has a prior MI or stroke diagnosis   Patient has failed these meds in past: simvastatin 80 mg Patient is currently controlled on the following medications: zetia 10 mg, aspirin ec 81 mg, rosuvastatin 20 mg daily.  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 He still mows his yard. Patient takes medications and last cholseterol panel was good.   Plan  Continue current medications     and  Spinal Stenosis/Post herpetic neuralgia:    Patient has failed these meds in past: Lyrica, meloxicam 15 mg daily, oxycodone-apap,gabapentin 300 mg 2 caps po bid Patient is currently uncontrolled on the following medications: hydrocodone-apap  5/325 1-2 q4h prn - not using, , acetaminophen 650 mg q8h prn - not taking often, pregabalin 75 mg bid  We discussed:  Patient was taking gabapentin and pregabalin together. He didn't realize he was only supposed to be taking 1. Pharmacist counseled that they are not to be used together and patient removed it from the lineup.   Plan  Continue current medications   Heartburn   Patient has failed these meds in past: none indicated Patient is currently controlled on the following medications: omeprazole  We discussed:  Patient states that heartburn is well controlled with this medication. He has no issue with it since beginning.   Plan  Continue current medications   Mood Stabilizer:   Patient has failed these meds in past: alprazolam 0.5 mg daily prn, trazodone 100 mg qhs, trintellix 5 mg daily Patient is currently controlled on the following medications: sertraline 50 mg tablet, primidone 50 mg daily prn  We discussed:  Patient lost his wife and son in the past few years. He said that he feels like he's been under water since those losses. He inidicates that the sertraline is helping a lot.   Plan  Continue current medications   Health Maintenance   Patient is currently controlled on the following medications:   Vitamin b-12 500 mcg - daily Meclizine 25 mg daily prn - dizziness  We discussed:  Patient states that he is not taking the meclizine often. Pharmacist counseled on somnolence and fall risk with meclizine.   Plan  Continue current medications  Vaccines   Reviewed and discussed patient's vaccination history. Hasn't taken COVID vaccine. He's concerned about the new vaccine. Just received dTap vaccine last Friday.   Immunization History  Administered Date(s) Administered  . Fluad Quad(high Dose 65+) 12/10/2018  . Pneumococcal Conjugate-13 12/20/2017    Plan  Recommended patient receive annual flu vaccine in office.   Medication Management   Pt uses  [facility] for all medications Patient cannot use a pill box due to tremor.   Pt endorses good compliance but had been out of a few of his medications for several days.   We discussed:getting all prescriptions lined up with mail-order for cost savings. Patient is going to pick up his furosemide and metoprolol locally today to avoid being out of them.   Plan Pharmacist will monitor patient for adherence and medication  efficacy.    Follow up: 1 month

## 2019-07-06 ENCOUNTER — Other Ambulatory Visit: Payer: Self-pay

## 2019-07-06 ENCOUNTER — Ambulatory Visit: Payer: Medicare Other

## 2019-07-06 DIAGNOSIS — I10 Essential (primary) hypertension: Secondary | ICD-10-CM

## 2019-07-06 DIAGNOSIS — E785 Hyperlipidemia, unspecified: Secondary | ICD-10-CM

## 2019-07-06 DIAGNOSIS — I2510 Atherosclerotic heart disease of native coronary artery without angina pectoris: Secondary | ICD-10-CM

## 2019-07-06 MED ORDER — SERTRALINE HCL 50 MG PO TABS: 50.0000 mg | ORAL_TABLET | Freq: Every morning | ORAL | 1 refills | Status: DC

## 2019-07-06 MED ORDER — AMLODIPINE BESYLATE 10 MG PO TABS: 10.0000 mg | ORAL_TABLET | Freq: Every day | ORAL | 1 refills | Status: DC

## 2019-07-06 MED ORDER — FUROSEMIDE 40 MG PO TABS: 40.0000 mg | ORAL_TABLET | Freq: Every day | ORAL | 0 refills | Status: DC

## 2019-07-06 MED ORDER — ENALAPRIL MALEATE 20 MG PO TABS: 20.0000 mg | ORAL_TABLET | Freq: Every day | ORAL | 1 refills | Status: DC

## 2019-07-06 MED ORDER — FUROSEMIDE 40 MG PO TABS: 40.0000 mg | ORAL_TABLET | Freq: Every day | ORAL | 1 refills | Status: DC

## 2019-07-06 MED ORDER — EZETIMIBE 10 MG PO TABS: 10.0000 mg | ORAL_TABLET | Freq: Every day | ORAL | 1 refills | Status: DC

## 2019-07-06 MED ORDER — METOPROLOL TARTRATE 50 MG PO TABS: 50.0000 mg | ORAL_TABLET | Freq: Two times a day (BID) | ORAL | 0 refills | Status: DC

## 2019-07-06 MED ORDER — METOPROLOL TARTRATE 50 MG PO TABS: 50.0000 mg | ORAL_TABLET | Freq: Two times a day (BID) | ORAL | 2 refills | Status: DC

## 2019-07-06 MED ORDER — POTASSIUM CHLORIDE ER 10 MEQ PO TBCR: 10.0000 meq | EXTENDED_RELEASE_TABLET | Freq: Every day | ORAL | 1 refills | Status: DC

## 2019-07-06 MED ORDER — ROSUVASTATIN CALCIUM 20 MG PO TABS: 20.0000 mg | ORAL_TABLET | Freq: Every day | ORAL | 1 refills | Status: DC

## 2019-07-06 NOTE — Patient Instructions (Addendum)
Visit Information  Thank you for your time discussing your medications. I look forward to working with you to achieve your health care goals. Below is a summary of what we talked about during our visit.   Goals Addressed            This Visit's Progress   . Pharmacy Care Plan       CARE PLAN ENTRY  Current Barriers:  . Chronic Disease Management support, education, and care coordination needs related to HTN and HLD  Pharmacist Clinical Goal(s):  . Ensure safety, efficacy, and affordability of medications . Recommend maintaining BP <140/90 mmHg  . Recommend keeping sodium intake less than 1500 mg/day.  . Goal Cholesterol: TC <200, LDL <100, TG <150 HDL >50 mg/dL   Interventions: . Pharmacist coordinated refill of metoprolol and furosemide at local pharmacy. Patient to pickup and resume taking today.  Marland Kitchen Pharmacist coordinating refills of maintenance medications with mail-order pharmacy for greatest cost savings. Recommend continue to take medications as prescribed daily.  Marland Kitchen Pharmacist provided patient with updated medication list.  Patient Self Care Activities:  . Recommend engaging in moderate intensity exercise 30 minutes 5 times each week as tolerated.  . Recommend following the Dash diet guidance of incorporating fruits, vegetables, whole-grains, low-fat dairy products, lean meats, legumes and nuts for optimal health. . Recommend patient continue to check blood pressure daily for next month and provide for Cardiologist.   Initial goal documentation        Mr. Clementson was given information about Chronic Care Management services today including:  1. CCM service includes personalized support from designated clinical staff supervised by his physician, including individualized plan of care and coordination with other care providers 2. 24/7 contact phone numbers for assistance for urgent and routine care needs. 3. Standard insurance, coinsurance, copays and deductibles apply for  chronic care management only during months in which we provide at least 20 minutes of these services. Most insurances cover these services at 100%, however patients may be responsible for any copay, coinsurance and/or deductible if applicable. This service may help you avoid the need for more expensive face-to-face services. 4. Only one practitioner may furnish and bill the service in a calendar month. 5. The patient may stop CCM services at any time (effective at the end of the month) by phone call to the office staff.  Patient agreed to services and verbal consent obtained.   Print copy of patient instructions provided.  Telephone follow up appointment with pharmacy team member scheduled for: 08/05/2019 at 68 am.   Sherre Poot, PharmD Clinical Pharmacist [facility] [phone]  Exercises To Do While Sitting  Exercises that you do while sitting (chair exercises) can give you many of the same benefits as full exercise. Benefits include strengthening your heart, burning calories, and keeping muscles and joints healthy. Exercise can also improve your mood and help with depression and anxiety. You may benefit from chair exercises if you are unable to do standing exercises because of:  Diabetic foot pain.  Obesity.  Illness.  Arthritis.  Recovery from surgery or injury.  Breathing problems.  Balance problems.  Another type of disability. Before starting chair exercises, check with your health care provider or a physical therapist to find out how much exercise you can tolerate and which exercises are safe for you. If your health care provider approves:  Start out slowly and build up over time. Aim to work up to about 10-20 minutes for each exercise session.  Make exercise part of your daily routine.  Drink water when you exercise. Do not wait until you are thirsty. Drink every 10-15 minutes.  Stop exercising right away if you have pain, nausea, shortness of breath,  or dizziness.  If you are exercising in a wheelchair, make sure to lock the wheels.  Ask your health care provider whether you can do tai chi or yoga. Many positions in these mind-body exercises can be modified to do while seated. Warm-up Before starting other exercises: 1. Sit up as straight as you can. Have your knees bent at 90 degrees, which is the shape of the capital letter "L." Keep your feet flat on the floor. 2. Sit at the front edge of your chair, if you can. 3. Pull in (tighten) the muscles in your abdomen and stretch your spine and neck as straight as you can. Hold this position for a few minutes. 4. Breathe in and out evenly. Try to concentrate on your breathing, and relax your mind. Stretching Exercise A: Arm stretch 1. Hold your arms out straight in front of your body. 2. Bend your hands at the wrist with your fingers pointing up, as if signaling someone to stop. Notice the slight tension in your forearms as you hold the position. 3. Keeping your arms out and your hands bent, rotate your hands outward as far as you can and hold this stretch. Aim to have your thumbs pointing up and your pinkie fingers pointing down. Slowly repeat arm stretches for one minute as tolerated. Exercise B: Leg stretch 1. If you can move your legs, try to "draw" letters on the floor with the toes of your foot. Write your name with one foot. 2. Write your name with the toes of your other foot. Slowly repeat the movements for one minute as tolerated. Exercise C: Reach for the sky 1. Reach your hands as far over your head as you can to stretch your spine. 2. Move your hands and arms as if you are climbing a rope. Slowly repeat the movements for one minute as tolerated. Range of motion exercises Exercise A: Shoulder roll 1. Let your arms hang loosely at your sides. 2. Lift just your shoulders up toward your ears, then let them relax back down. 3. When your shoulders feel loose, rotate your shoulders  in backward and forward circles. Do shoulder rolls slowly for one minute as tolerated. Exercise B: March in place 1. As if you are marching, pump your arms and lift your legs up and down. Lift your knees as high as you can. ? If you are unable to lift your knees, just pump your arms and move your ankles and feet up and down. March in place for one minute as tolerated. Exercise C: Seated jumping jacks 1. Let your arms hang down straight. 2. Keeping your arms straight, lift them up over your head. Aim to point your fingers to the ceiling. 3. While you lift your arms, straighten your legs and slide your heels along the floor to your sides, as wide as you can. 4. As you bring your arms back down to your sides, slide your legs back together. ? If you are unable to use your legs, just move your arms. Slowly repeat seated jumping jacks for one minute as tolerated. Strengthening exercises Exercise A: Shoulder squeeze 1. Hold your arms straight out from your body to your sides, with your elbows bent and your fists pointed at the ceiling. 2. Keeping your arms in the  bent position, move them forward so your elbows and forearms meet in front of your face. 3. Open your arms back out as wide as you can with your elbows still bent, until you feel your shoulder blades squeezing together. Hold for 5 seconds. Slowly repeat the movements forward and backward for one minute as tolerated. Contact a health care provider if you:  Had to stop exercising due to any of the following: ? Pain. ? Nausea. ? Shortness of breath. ? Dizziness. ? Fatigue.  Have significant pain or soreness after exercising. Get help right away if you have:  Chest pain.  Difficulty breathing. These symptoms may represent a serious problem that is an emergency. Do not wait to see if the symptoms will go away. Get medical help right away. Call your local emergency services (911 in the [place] Do not drive yourself to the hospital. This  information is not intended to replace advice given to you by your health care provider. Make sure you discuss any questions you have with your health care provider. Document Revised: 07/03/2018 Document Reviewed: 01/23/2017 Elsevier Patient Education  2020 [employer].

## 2019-07-07 ENCOUNTER — Ambulatory Visit (INDEPENDENT_AMBULATORY_CARE_PROVIDER_SITE_OTHER): Payer: Medicare Other | Admitting: Legal Medicine

## 2019-07-07 ENCOUNTER — Other Ambulatory Visit: Payer: Self-pay

## 2019-07-07 ENCOUNTER — Encounter: Payer: Self-pay | Admitting: Legal Medicine

## 2019-07-07 DIAGNOSIS — L03114 Cellulitis of left upper limb: Secondary | ICD-10-CM | POA: Diagnosis not present

## 2019-07-07 DIAGNOSIS — M79642 Pain in left hand: Secondary | ICD-10-CM | POA: Diagnosis not present

## 2019-07-07 DIAGNOSIS — R600 Localized edema: Secondary | ICD-10-CM | POA: Diagnosis not present

## 2019-07-07 DIAGNOSIS — M19042 Primary osteoarthritis, left hand: Secondary | ICD-10-CM | POA: Diagnosis not present

## 2019-07-07 HISTORY — DX: Cellulitis of left upper limb: L03.114

## 2019-07-07 LAB — LAB REPORT - SCANNED
ANA Ab, IFA: NEGATIVE
CRP: 31.1
HCT: 37 (ref 29–41)
Hemoglobin: 12.7
Platelet: 135
RBC: 4.26
Uric Acid: 5.4
WBC: 9.4

## 2019-07-07 NOTE — Assessment & Plan Note (Signed)
Patient woke up with swelling and redness left thenar eminence with inability to make fist.  No previous history of gout.  It is warm and consistent with cellulitis on hand. Refer to orthopedics.  He has pain medicines at home.  Hand wrapped.

## 2019-07-07 NOTE — Progress Notes (Signed)
Acute Office Visit  Subjective:    Patient ID: Jack Hodges, male    DOB: 02/25/1931, 86 y.o.   MRN: [id number]  Chief Complaint  Patient presents with  . Joint Swelling    Left wrist swelling since monday    HPI Patient is in today for left wrist swollen.  The hypothenar area is red , swollen and tender.  No previous history of gout.  He has trouble making a fist.  No trauma and no history of puncture wound.  Past Medical History:  Diagnosis Date  . Arthritis   . Bilateral foot-drop 01/18/2017  . Coronary artery disease involving native coronary artery of native heart without angina pectoris 11/01/2015   Overview:  Bypass surgery in 1996 Overview:  Overview:  Bypass surgery in [place]:  Continue his current regimen.  Follow up as noted.  . CVA (cerebral vascular accident) ([place]) 04/03/2016   Last Assessment & Plan:  The patient underwent extensive CVA workup.  MRI is negative.  He does have aortic stenosis by echocardiogram.  Upon review of Care everywhere he is followed by Dr. Agustin Cree, cardiologist for this.  Carotid ultrasound negative for hemodynamically significant stenosis.  Lipid panel within normal limits.  He has not had any further episodes of dizziness or nausea vomiting.   . Dementia ([place])   . Depression   . Dizziness 04/19/2016  . DNR (do not resuscitate) 04/03/2016   Overview:  I had an extensive discussion with the patient on 04/03/2016 regarding his end of life wishes.  He and his daughter agree that do not resuscitate is in keeping with his beliefs.  . Dyslipidemia 11/01/2015   Last Assessment & Plan:  Formatting of this note might be different from the original. Cholesterol 136   Triglycerides  83  HDL  50.1  LDL Calculated  69  VLDL Cholesterol Cal  17   Continue statin.  . Essential hypertension 11/01/2015   Last Assessment & Plan:  Resume home medications.  Follow-up as noted above.  . Hypertension   . Neuropathy   . Sleep apnea   . Spinal  stenosis of lumbar region with neurogenic claudication 01/18/2017  . 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    Past Surgical History:  Procedure Laterality Date  . CARDIAC SURGERY      Family History  Problem Relation Age of Onset  . Cancer Mother   . Diabetes Mother   . Kidney disease Mother   . Stroke Mother   . Sleep disorder Mother   . Arthritis Father   . Migraines Father   . Hypertension Father   . Sleep disorder Father   . Arthritis Brother   . Cancer Brother     Social History   Socioeconomic History  . Marital status: Widowed    Spouse name: Not on file  . Number of children: 2  . Years of education: Not on file  . Highest education level: Not on file  Occupational History  . Occupation: retired  Tobacco Use  . Smoking status: Former Smoker    Types: Cigarettes, Cigars    Quit date: 05/02/1962    Years since quitting: 57.2  . Smokeless tobacco: Never Used  Substance and Sexual Activity  . Alcohol use: No  . Drug use: No  . Sexual activity: Not Currently  Other Topics Concern  . Not on file  Social History Narrative  .  Not on file   Social Determinants of Health   Financial Resource Strain:   . Difficulty of Paying Living Expenses:   Food Insecurity: No Food Insecurity  . Worried About Programme researcher, broadcasting/film/video in the Last Year: Never true  . Ran Out of Food in the Last Year: Never true  Transportation Needs: No Transportation Needs  . Lack of Transportation (Medical): No  . Lack of Transportation (Non-Medical): No  Physical Activity:   . Days of Exercise per Week:   . Minutes of Exercise per Session:   Stress:   . Feeling of Stress :   Social Connections:   . Frequency of Communication with Friends and Family:   . Frequency of Social Gatherings with Friends and Family:   . Attends Religious Services:   . Active Member of Clubs or Organizations:   . Attends Banker Meetings:    Marland Kitchen Marital Status:   Intimate Partner Violence:   . Fear of Current or Ex-Partner:   . Emotionally Abused:   Marland Kitchen Physically Abused:   . Sexually Abused:     Outpatient Medications Prior to Visit  Medication Sig Dispense Refill  . acetaminophen (TYLENOL) 650 MG CR tablet Take 650 mg by mouth every 8 (eight) hours as needed for pain.    Marland Kitchen amLODipine (NORVASC) 10 MG tablet Take 1 tablet (10 mg total) by mouth daily. 90 tablet 1  . aspirin EC 81 MG tablet Take 81 mg by mouth daily.    . enalapril (VASOTEC) 20 MG tablet Take 1 tablet (20 mg total) by mouth daily. 90 tablet 1  . ezetimibe (ZETIA) 10 MG tablet Take 1 tablet (10 mg total) by mouth daily. 90 tablet 1  . furosemide (LASIX) 40 MG tablet Take 1 tablet (40 mg total) by mouth daily. 90 tablet 1  . gabapentin (NEURONTIN) 300 MG capsule TAKE 2 CAPSULES BY MOUTH  TWO TIMES DAILY 360 capsule 2  . HYDROcodone-acetaminophen (NORCO/VICODIN) 5-325 MG tablet Take 1-2 tablets by mouth every 4 (four) hours as needed. 8 tablet 0  . meclizine (ANTIVERT) 25 MG tablet Take 25 mg by mouth 3 (three) times daily as needed for dizziness.    . metoprolol tartrate (LOPRESSOR) 50 MG tablet Take 1 tablet (50 mg total) by mouth 2 (two) times daily. 180 tablet 2  . omeprazole (PRILOSEC) 20 MG capsule Take 2 capsules (40 mg total) by mouth daily. (Patient taking differently: Take 20 mg by mouth daily before breakfast. ) 180 capsule 2  . potassium chloride (KLOR-CON) 10 MEQ tablet Take 1 tablet (10 mEq total) by mouth daily. 90 tablet 1  . pregabalin (LYRICA) 75 MG capsule Take 1 capsule (75 mg total) by mouth 2 (two) times daily. 60 capsule 3  . primidone (MYSOLINE) 50 MG tablet Take 1 tablet twice a day (Patient taking differently: Take 50 mg by mouth daily as needed (to stabilize the mood). ) 60 tablet 6  . rosuvastatin (CRESTOR) 20 MG tablet Take 1 tablet (20 mg total) by mouth at bedtime. 90 tablet 1  . sertraline (ZOLOFT) 50 MG tablet Take 1 tablet (50 mg  total) by mouth in the morning. 90 tablet 1  . vitamin B-12 (CYANOCOBALAMIN) 500 MCG tablet Take 500 mcg by mouth daily.     No facility-administered medications prior to visit.    Allergies  Allergen Reactions  . Poison Oak Extract Rash    Review of Systems  Constitutional: Negative.   HENT: Negative.   Eyes: Negative.  Respiratory: Negative.   Cardiovascular: Negative.   Gastrointestinal: Negative.   Endocrine: Negative.   Genitourinary: Negative.   Musculoskeletal: Negative.   Skin: Negative.   Neurological: Negative.   Psychiatric/Behavioral: Positive for suicidal ideas.       Objective:    Physical Exam Vitals reviewed.  Constitutional:      Appearance: Normal appearance.  HENT:     Head: Normocephalic and atraumatic.  Cardiovascular:     Rate and Rhythm: Normal rate and regular rhythm.     Pulses: Normal pulses.     Heart sounds: Normal heart sounds.  Pulmonary:     Effort: Pulmonary effort is normal.     Breath sounds: Normal breath sounds.  Musculoskeletal:     Right hand: Normal.     Left hand: Swelling and tenderness present.       Arms:     Cervical back: Normal range of motion and neck supple.     Comments: Swelling left thenar eminence with tenderness and redness.  He woke up with this 07/06/2019.    Neurological:     General: No focal deficit present.     Mental Status: He is alert and oriented to person, place, and time.     BP 128/78 (BP Location: Right Arm, Patient Position: Sitting)   Pulse 74   Temp 98.6 F (37 C) (Oral)   Resp 17   Ht 5\' 4"  (1.626 m)   Wt 174 lb (78.9 kg)   BMI 29.87 kg/m  Wt Readings from Last 3 Encounters:  07/07/19 174 lb (78.9 kg)  06/25/19 174 lb 9.6 oz (79.2 kg)  06/17/19 170 lb (77.1 kg)    Health Maintenance Due  Topic Date Due  . PNA vac Low Risk Adult (2 of 2 - PPSV23) 12/21/2018    There are no preventive care reminders to display for this patient.   No results found for: TSH Lab Results    Component Value Date   WBC 9.9 06/17/2019   HGB 13.5 06/17/2019   HCT 41.0 06/17/2019   MCV 92.3 06/17/2019   PLT 122 (L) 06/17/2019   Lab Results  Component Value Date   NA 137 06/17/2019   K 3.2 (L) 06/17/2019   CO2 23 06/17/2019   GLUCOSE 141 (H) 06/17/2019   BUN 12 06/17/2019   CREATININE 1.09 06/17/2019   BILITOT 1.4 (H) 06/17/2019   ALKPHOS 58 06/17/2019   AST 17 06/17/2019   ALT 12 06/17/2019   PROT 6.3 (L) 06/17/2019   ALBUMIN 3.4 (L) 06/17/2019   CALCIUM 8.9 06/17/2019   ANIONGAP 10 06/17/2019   No results found for: CHOL No results found for: HDL No results found for: LDLCALC No results found for: TRIG No results found for: CHOLHDL No results found for: 06/19/2019     Assessment & Plan:   Problem List Items Addressed This Visit      Other   Cellulitis of left hand    Patient woke up with swelling and redness left thenar eminence with inability to make fist.  No previous history of gout.  It is warm and consistent with cellulitis on hand. Refer to orthopedics.  He has pain medicines at home.  Hand wrapped.          No orders of the defined types were placed in this encounter.    [id number], MD

## 2019-07-09 ENCOUNTER — Ambulatory Visit (INDEPENDENT_AMBULATORY_CARE_PROVIDER_SITE_OTHER): Payer: Medicare Other

## 2019-07-09 ENCOUNTER — Other Ambulatory Visit: Payer: Self-pay

## 2019-07-09 DIAGNOSIS — I2510 Atherosclerotic heart disease of native coronary artery without angina pectoris: Secondary | ICD-10-CM

## 2019-07-09 DIAGNOSIS — R0989 Other specified symptoms and signs involving the circulatory and respiratory systems: Secondary | ICD-10-CM | POA: Diagnosis not present

## 2019-07-09 DIAGNOSIS — I739 Peripheral vascular disease, unspecified: Secondary | ICD-10-CM

## 2019-07-09 NOTE — Progress Notes (Signed)
Lower extremity arterial duplex performed.  Jimmy Rel RDCS, RVT

## 2019-07-09 NOTE — Progress Notes (Signed)
Carotid duplex exam has been performed.  Jimmy Ricky Gallery RDCS, RVT 

## 2019-07-10 ENCOUNTER — Ambulatory Visit (INDEPENDENT_AMBULATORY_CARE_PROVIDER_SITE_OTHER): Payer: Medicare Other | Admitting: Cardiology

## 2019-07-10 ENCOUNTER — Encounter: Payer: Self-pay | Admitting: Cardiology

## 2019-07-10 VITALS — BP 124/70 | HR 69 | Temp 97.8°F | Ht 64.0 in | Wt 172.8 lb

## 2019-07-10 DIAGNOSIS — I350 Nonrheumatic aortic (valve) stenosis: Secondary | ICD-10-CM

## 2019-07-10 DIAGNOSIS — I2510 Atherosclerotic heart disease of native coronary artery without angina pectoris: Secondary | ICD-10-CM | POA: Diagnosis not present

## 2019-07-10 DIAGNOSIS — E785 Hyperlipidemia, unspecified: Secondary | ICD-10-CM

## 2019-07-10 DIAGNOSIS — Z951 Presence of aortocoronary bypass graft: Secondary | ICD-10-CM | POA: Diagnosis not present

## 2019-07-10 DIAGNOSIS — I10 Essential (primary) hypertension: Secondary | ICD-10-CM

## 2019-07-10 NOTE — Progress Notes (Signed)
Cardiology Office Note:    Date:  07/10/2019   ID:  Jack Hodges, DOB Feb 04, 1931, MRN [id number]  PCP:  Abigail Miyamoto, MD  Cardiologist:  Gypsy Balsam, MD    Referring MD: Abigail Miyamoto,*   No chief complaint on file. I am still having pain on the right side of her chest  History of Present Illness:    Jack Hodges is a 86 y.o. male with past medical history significant for coronary artery disease, status post: Stress 2096, history of CVA, essential hypertension, CP aortic stenosis.  Recently he ended going to the emergency because of pain of the right side of his chest.  He was find to have shingles.  Still suffering a lot and complaining of a lot of pain.  Also complained of having weakness in his lower extremities.  Last time have seen him which was in January we talked about his aortic stenosis and we talked about potentially fixing the plaque in her open surgery which will be difficult because of prior history of bypass surgery or maybe TAVR.  He was very reluctant he wanted to think about it and he still thinking about it but now he is more confident that may be something done.  He does not want to do anything until his symptoms of shingles will improve.  Denies having any typical tightness squeezing pressure burning chest, no shortness of breath but his ability to exercise is limited because of chronic neurological issues in his lower extremities, denies having any dizziness or passing out.  Past Medical History:  Diagnosis Date  . Aortic stenosis 05/02/2017  . Arthritis   . Bilateral foot-drop 01/18/2017  . Cellulitis of left hand 07/07/2019  . Coronary artery disease involving native coronary artery of native heart without angina pectoris 11/01/2015   Overview:  Bypass surgery in 1996 Overview:  Overview:  Bypass surgery in 1996  Last Assessment & Plan:  Continue his current regimen.  Follow up as noted.  . CVA (cerebral vascular accident) (HCC) 04/03/2016   Last  Assessment & Plan:  The patient underwent extensive CVA workup.  MRI is negative.  He does have aortic stenosis by echocardiogram.  Upon review of Care everywhere he is followed by Dr. Bing Matter, cardiologist for this.  Carotid ultrasound negative for hemodynamically significant stenosis.  Lipid panel within normal limits.  He has not had any further episodes of dizziness or nausea vomiting.   . Dementia (HCC)   . Depression   . Dizziness 04/19/2016  . DNR (do not resuscitate) 04/03/2016   Overview:  I had an extensive discussion with the patient on 04/03/2016 regarding his end of life wishes.  He and his daughter agree that do not resuscitate is in keeping with his beliefs.  . Dyslipidemia 11/01/2015   Last Assessment & Plan:  Formatting of this note might be different from the original. Cholesterol 136   Triglycerides  83  HDL  50.1  LDL Calculated  69  VLDL Cholesterol Cal  17   Continue statin.  Marland Kitchen Dyspnea on exertion 06/30/2018  . Essential hypertension 11/01/2015   Last Assessment & Plan:  Resume home medications.  Follow-up as noted above.  Marland Kitchen Herpes zoster 05/19/2019  . Hypertension   . Neuropathy   . Other chest pain   . Recurrent falls 06/25/2019  . Severe aortic stenosis   . Sleep apnea   . 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 Status post coronary artery bypass  graft 11/01/2015   Last Assessment & Plan:  Continue home regimen.    Past Surgical History:  Procedure Laterality Date  . CARDIAC SURGERY      Current Medications: Current Meds  Medication Sig  . acetaminophen (TYLENOL) 650 MG CR tablet Take 650 mg by mouth every 8 (eight) hours as needed for pain.  Marland Kitchen amLODipine (NORVASC) 10 MG tablet Take 1 tablet (10 mg total) by mouth daily.  Marland Kitchen aspirin EC 81 MG tablet Take 81 mg by mouth daily.  . enalapril (VASOTEC) 20 MG tablet Take 1 tablet (20 mg total) by mouth daily.  Marland Kitchen ezetimibe (ZETIA) 10 MG tablet Take 1 tablet (10 mg total) by mouth daily.  .  furosemide (LASIX) 40 MG tablet Take 1 tablet (40 mg total) by mouth daily.  . meclizine (ANTIVERT) 25 MG tablet Take 25 mg by mouth 3 (three) times daily as needed for dizziness.  Marland Kitchen omeprazole (PRILOSEC) 20 MG capsule Take 2 capsules (40 mg total) by mouth daily.  . pregabalin (LYRICA) 75 MG capsule Take 1 capsule (75 mg total) by mouth 2 (two) times daily.  . primidone (MYSOLINE) 50 MG tablet Take 1 tablet twice a day  . rosuvastatin (CRESTOR) 20 MG tablet Take 1 tablet (20 mg total) by mouth at bedtime.  . sertraline (ZOLOFT) 50 MG tablet Take 1 tablet (50 mg total) by mouth in the morning.  . vitamin B-12 (CYANOCOBALAMIN) 500 MCG tablet Take 500 mcg by mouth daily.     Allergies:   Poison oak extract   Social History   Socioeconomic History  . Marital status: Widowed    Spouse name: Not on file  . Number of children: 2  . Years of education: Not on file  . Highest education level: Not on file  Occupational History  . Occupation: retired  Tobacco Use  . Smoking status: Former Smoker    Types: Cigarettes, Cigars    Quit date: 05/02/1962    Years since quitting: 57.2  . Smokeless tobacco: Never Used  Substance and Sexual Activity  . Alcohol use: No  . Drug use: No  . Sexual activity: Not Currently  Other Topics Concern  . Not on file  Social History Narrative  . Not on file   Social Determinants of Health   Financial Resource Strain:   . Difficulty of Paying Living Expenses:   Food Insecurity: No Food Insecurity  . Worried About Charity fundraiser in the Last Year: Never true  . Ran Out of Food in the Last Year: Never true  Transportation Needs: No Transportation Needs  . Lack of Transportation (Medical): No  . Lack of Transportation (Non-Medical): No  Physical Activity:   . Days of Exercise per Week:   . Minutes of Exercise per Session:   Stress:   . Feeling of Stress :   Social Connections:   . Frequency of Communication with Friends and Family:   . Frequency  of Social Gatherings with Friends and Family:   . Attends Religious Services:   . Active Member of Clubs or Organizations:   . Attends Archivist Meetings:   Marland Kitchen Marital Status:      Family History: The patient's family history includes Arthritis in his brother and father; Cancer in his brother and mother; Diabetes in his mother; Hypertension in his father; Kidney disease in his mother; Migraines in his father; Sleep disorder in his father and mother; Stroke in his mother. ROS:   Please see the  history of present illness.    All 14 point review of systems negative except as described per history of present illness  EKGs/Labs/Other Studies Reviewed:      Recent Labs: 06/17/2019: ALT 12; B Natriuretic Peptide 154.2; BUN 12; Creatinine, Ser 1.09; Hemoglobin 13.5; Platelets 122; Potassium 3.2; Sodium 137  Recent Lipid Panel No results found for: CHOL, TRIG, HDL, CHOLHDL, VLDL, LDLCALC, LDLDIRECT  Physical Exam:    VS:  BP 124/70   Pulse 69   Temp 97.8 F (36.6 C)   Ht 5\' 4"  (1.626 m)   Wt 172 lb 12.8 oz (78.4 kg)   SpO2 96%   BMI 29.66 kg/m     Wt Readings from Last 3 Encounters:  07/10/19 172 lb 12.8 oz (78.4 kg)  07/07/19 174 lb (78.9 kg)  06/25/19 174 lb 9.6 oz (79.2 kg)     GEN:  Well nourished, well developed in no acute distress HEENT: Normal NECK: No JVD; there is loud left carotid bruit, there is soft bruit on the right LYMPHATICS: No lymphadenopathy CARDIAC: RRR, systolic ejection murmur grade 3/6 percent right upper portion of the sternum,, no rubs, no gallops RESPIRATORY:  Clear to auscultation without rales, wheezing or rhonchi  ABDOMEN: Soft, non-tender, non-distended MUSCULOSKELETAL:  No edema; No deformity  SKIN: Warm and dry LOWER EXTREMITIES: no swelling NEUROLOGIC:  Alert and oriented x 3 PSYCHIATRIC:  Normal affect   ASSESSMENT:    1. Severe aortic stenosis   2. Coronary artery disease involving native coronary artery of native heart  without angina pectoris   3. Status post coronary artery bypass graft   4. Dyslipidemia   5. Essential hypertension    PLAN:    In order of problems listed above:  1. Severe arctic stenosis.  He still does not want to pursue intervention.  However he agreed to have another echocardiogram done in May and after that I will have another conversation about potentially doing TAVR if he is willing.  Luckily, he does not have any symptomatology suggest critical stenosis. 2. Coronary disease, status post coronary bypass graft.  Doing well from that point review. 3. Dyslipidemia he is on high intensity statin which I will continue, he is also on Zetia, I will schedule him to have fasting profile done. 4. Essential hypertension, he brought the blood pressure measurements from home and all are good. 5. Peripheral vascular disease, he does have mild bruit on the left side of his neck, however last carotid ultrasound dated 18 December of last year did not show critical stenosis.  We will repeat this study in December.  I did review records from emergency room for this visit.   Medication Adjustments/Labs and Tests Ordered: Current medicines are reviewed at length with the patient today.  Concerns regarding medicines are outlined above.  No orders of the defined types were placed in this encounter.  Medication changes: No orders of the defined types were placed in this encounter.   Signed, January, MD, [facility] 07/10/2019 10:42 AM    [facility]

## 2019-07-10 NOTE — Patient Instructions (Signed)
Medication Instructions:  No medication changes *If you need a refill on your cardiac medications before your next appointment, please call your pharmacy*   Lab Work: None ordered If you have labs (blood work) drawn today and your tests are completely normal, you will receive your results only by: Marland Kitchen MyChart Message (if you have MyChart) OR . A paper copy in the mail If you have any lab test that is abnormal or we need to change your treatment, we will call you to review the results.   Testing/Procedures: Your physician has requested that you have an echocardiogram. Echocardiography is a painless test that uses sound waves to create images of your heart. It provides your doctor with information about the size and shape of your heart and how well your heart's chambers and valves are working. This procedure takes approximately one hour. There are no restrictions for this procedure.     Follow-Up: At [facility], you and your health needs are our priority.  As part of our continuing mission to provide you with exceptional heart care, we have created designated Provider Care Teams.  These Care Teams include your primary Cardiologist (physician) and Advanced Practice Providers (APPs -  Physician Assistants and Nurse Practitioners) who all work together to provide you with the care you need, when you need it.  We recommend signing up for the patient portal called "MyChart".  Sign up information is provided on this After Visit Summary.  MyChart is used to connect with patients for Virtual Visits (Telemedicine).  Patients are able to view lab/test results, encounter notes, upcoming appointments, etc.  Non-urgent messages can be sent to your provider as well.   To learn more about what you can do with MyChart, go to [email or web address].    Your next appointment:   2 month(s)  The format for your next appointment:   In Person  Provider:   Gypsy Balsam, MD   Other  Instructions  Echocardiogram An echocardiogram is a procedure that uses painless sound waves (ultrasound) to produce an image of the heart. Images from an echocardiogram can provide important information about:  Signs of coronary artery disease (CAD).  Aneurysm detection. An aneurysm is a weak or damaged part of an artery wall that bulges out from the normal force of blood pumping through the body.  Heart size and shape. Changes in the size or shape of the heart can be associated with certain conditions, including heart failure, aneurysm, and CAD.  Heart muscle function.  Heart valve function.  Signs of a past heart attack.  Fluid buildup around the heart.  Thickening of the heart muscle.  A tumor or infectious growth around the heart valves. Tell a health care provider about:  Any allergies you have.  All medicines you are taking, including vitamins, herbs, eye drops, creams, and over-the-counter medicines.  Any blood disorders you have.  Any surgeries you have had.  Any medical conditions you have.  Whether you are pregnant or may be pregnant. What are the risks? Generally, this is a safe procedure. However, problems may occur, including:  Allergic reaction to dye (contrast) that may be used during the procedure. What happens before the procedure? No specific preparation is needed. You may eat and drink normally. What happens during the procedure?   An IV tube may be inserted into one of your veins.  You may receive contrast through this tube. A contrast is an injection that improves the quality of the pictures from your heart.  A  gel will be applied to your chest.  A wand-like tool (transducer) will be moved over your chest. The gel will help to transmit the sound waves from the transducer.  The sound waves will harmlessly bounce off of your heart to allow the heart images to be captured in real-time motion. The images will be recorded on a computer. The  procedure may vary among health care providers and hospitals. What happens after the procedure?  You may return to your normal, everyday life, including diet, activities, and medicines, unless your health care provider tells you not to do that. Summary  An echocardiogram is a procedure that uses painless sound waves (ultrasound) to produce an image of the heart.  Images from an echocardiogram can provide important information about the size and shape of your heart, heart muscle function, heart valve function, and fluid buildup around your heart.  You do not need to do anything to prepare before this procedure. You may eat and drink normally.  After the echocardiogram is completed, you may return to your normal, everyday life, unless your health care provider tells you not to do that. This information is not intended to replace advice given to you by your health care provider. Make sure you discuss any questions you have with your health care provider. Document Revised: 07/03/2018 Document Reviewed: 04/14/2016 Elsevier Patient Education  2020 [employer]vier Inc.   

## 2019-07-13 DIAGNOSIS — M19042 Primary osteoarthritis, left hand: Secondary | ICD-10-CM | POA: Diagnosis not present

## 2019-07-14 ENCOUNTER — Other Ambulatory Visit: Payer: Self-pay

## 2019-07-14 DIAGNOSIS — D538 Other specified nutritional anemias: Secondary | ICD-10-CM

## 2019-07-14 NOTE — Progress Notes (Signed)
cbc

## 2019-07-15 ENCOUNTER — Encounter: Payer: Self-pay | Admitting: Legal Medicine

## 2019-07-16 ENCOUNTER — Telehealth: Payer: Self-pay

## 2019-07-16 NOTE — Telephone Encounter (Signed)
-----   Message from Georgeanna Lea, MD sent at 07/15/2019  1:20 PM EDT ----- Lower extremities show significant narrowing in left SFA.  To vascular specialist, however if you want to talk first with me we need to get follow-up appointment

## 2019-07-16 NOTE — Telephone Encounter (Signed)
Spoke with patient regarding results and recommendation.  Patient verbalizes understanding but does not want to see a vascular specialist at this time and also does not want to schedule a follow up visit with Dr. Bing Matter at this time. He states that he will call back when he is ready to do so.   Advised patient to call back with any issues or concerns.

## 2019-07-17 ENCOUNTER — Other Ambulatory Visit: Payer: Self-pay

## 2019-07-17 ENCOUNTER — Other Ambulatory Visit: Payer: Medicare Other

## 2019-07-17 DIAGNOSIS — D538 Other specified nutritional anemias: Secondary | ICD-10-CM | POA: Diagnosis not present

## 2019-07-18 LAB — CBC WITH DIFFERENTIAL/PLATELET
Basophils Absolute: 0 10*3/uL (ref 0.0–0.2)
Basos: 1 %
EOS (ABSOLUTE): 0.3 10*3/uL (ref 0.0–0.4)
Eos: 5 %
Hematocrit: 39.4 % (ref 37.5–51.0)
Hemoglobin: 13.5 g/dL (ref 13.0–17.7)
Immature Grans (Abs): 0 10*3/uL (ref 0.0–0.1)
Immature Granulocytes: 0 %
Lymphocytes Absolute: 2.6 10*3/uL (ref 0.7–3.1)
Lymphs: 36 %
MCH: 30.5 pg (ref 26.6–33.0)
MCHC: 34.3 g/dL (ref 31.5–35.7)
MCV: 89 fL (ref 79–97)
Monocytes Absolute: 0.4 10*3/uL (ref 0.1–0.9)
Monocytes: 6 %
Neutrophils Absolute: 3.7 10*3/uL (ref 1.4–7.0)
Neutrophils: 52 %
Platelets: 163 10*3/uL (ref 150–450)
RBC: 4.42 x10E6/uL (ref 4.14–5.80)
RDW: 14.4 % (ref 11.6–15.4)
WBC: 7.2 10*3/uL (ref 3.4–10.8)

## 2019-07-18 LAB — VITAMIN B12: Vitamin B-12: 1492 pg/mL — ABNORMAL HIGH (ref 232–1245)

## 2019-07-18 LAB — FOLATE: Folate: 15 ng/mL (ref >3.0)

## 2019-07-18 LAB — FERRITIN: Ferritin: 218 ng/mL (ref 30–400)

## 2019-07-18 NOTE — Progress Notes (Signed)
Ferritin normal, folate normal, B12 high 1492pg, blood count back to normal lp

## 2019-07-28 ENCOUNTER — Other Ambulatory Visit (INDEPENDENT_AMBULATORY_CARE_PROVIDER_SITE_OTHER): Payer: Medicare Other

## 2019-07-28 DIAGNOSIS — D649 Anemia, unspecified: Secondary | ICD-10-CM | POA: Diagnosis not present

## 2019-07-28 LAB — POC HEMOCCULT BLD/STL (HOME/3-CARD/SCREEN)
Card #2 Fecal Occult Blod, POC: NEGATIVE
Fecal Occult Blood, POC: NEGATIVE

## 2019-07-28 NOTE — Progress Notes (Signed)
Negative hemocults, good, we will follow blood counts lp

## 2019-07-28 NOTE — Progress Notes (Signed)
Patient brought in hemocult cards to be read. Hemocult cards developed and were negative x 3.

## 2019-08-03 ENCOUNTER — Other Ambulatory Visit: Payer: Self-pay

## 2019-08-03 ENCOUNTER — Ambulatory Visit (INDEPENDENT_AMBULATORY_CARE_PROVIDER_SITE_OTHER): Payer: Medicare Other

## 2019-08-03 DIAGNOSIS — I350 Nonrheumatic aortic (valve) stenosis: Secondary | ICD-10-CM | POA: Diagnosis not present

## 2019-08-03 DIAGNOSIS — I2510 Atherosclerotic heart disease of native coronary artery without angina pectoris: Secondary | ICD-10-CM | POA: Diagnosis not present

## 2019-08-03 NOTE — Progress Notes (Signed)
Complete echocardiogram has been performed.  Jimmy Alverta Caccamo RDCS, RVT 

## 2019-08-05 ENCOUNTER — Ambulatory Visit: Payer: Medicare Other

## 2019-08-05 DIAGNOSIS — I2510 Atherosclerotic heart disease of native coronary artery without angina pectoris: Secondary | ICD-10-CM

## 2019-08-05 DIAGNOSIS — I10 Essential (primary) hypertension: Secondary | ICD-10-CM

## 2019-08-05 NOTE — Patient Instructions (Signed)
Visit Information  Goals Addressed            This Visit's Progress   . Pharmacy Care Plan       CARE PLAN ENTRY  Current Barriers:  . Chronic Disease Management support, education, and care coordination needs related to HTN and HLD  Pharmacist Clinical Goal(s):  . Ensure safety, efficacy, and affordability of medications . Recommend maintaining BP <140/90 mmHg  . Recommend keeping sodium intake less than 1500 mg/day.  . Goal Cholesterol: TC <200, LDL <100, TG <150 HDL >50 mg/dL   Interventions: . Recommend continue to take medications as prescribed daily.   Patient Self Care Activities:  . Over the next 30 days, recommend following the Dash diet guidance of incorporating fruits, vegetables, whole-grains, low-fat dairy products, lean meats, legumes and nuts for optimal health. . Over the next month, recommend patient continue to check blood pressure daily for next month and provide for visits.   Please see past updates related to this goal by clicking on the "Past Updates" button in the selected goal         The patient verbalized understanding of instructions provided today and declined a print copy of patient instruction materials.   Telephone follow up appointment with pharmacy team member scheduled for:June 2021  Juliane Lack, PharmD, [facility] Clinical Pharmacist [facility] [phone] (office) [phone] (mobile)

## 2019-08-05 NOTE — Chronic Care Management (AMB) (Signed)
Chronic Care Management Pharmacy  Name: Jack Hodges  MRN: [id number] DOB: October 21, 1930  Chief Complaint/ HPI  Jack Hodges,  86 y.o. , male presents for their Initial CCM visit with the clinical pharmacist In office.  PCP : Lillard Anes, MD  Their chronic conditions include: CAD, hx of CVA, HTN, Spinal stenosis, and aortic stenosis.   Office Visits: 06/25/2019 - routine follow-up. dTap ordered from Pharmacy.  06/11/2019- Patient had stopped taking amlodipine and enalapril. Stopped gabapentin and start Lyrica for shingles pain.   05/27/2019 - Gabapentin for shingles pain.  05/19/2019 - Started on valtrex and hydrocodone for shingles. 04/13/2019 - no anemia, chronically low platelets but stable, normal kidney and liver test, good cholesterol panel. 02/03/2019 - steroid injection for shoulder.    Consult Visit: 06/17/2019 - ED visit for chest pain. Ruled out cardiac origin. Likely post herpetic neuralgia.  04/10/2019 - artery stenosis recommended. BP elevated in office. 140/90 per patient at home. No med changes at this time.  02/13/2019 - Dr. Raliegh Ip would like patient on a statin for optimal management.     Medications: Outpatient Encounter Medications as of 08/05/2019  Medication Sig Note  . acetaminophen (TYLENOL) 650 MG CR tablet Take 650 mg by mouth every 8 (eight) hours as needed for pain.   Marland Kitchen amLODipine (NORVASC) 10 MG tablet Take 1 tablet (10 mg total) by mouth daily.   Marland Kitchen aspirin EC 81 MG tablet Take 81 mg by mouth daily.   . enalapril (VASOTEC) 20 MG tablet Take 1 tablet (20 mg total) by mouth daily.   Marland Kitchen ezetimibe (ZETIA) 10 MG tablet Take 1 tablet (10 mg total) by mouth daily.   . furosemide (LASIX) 40 MG tablet Take 1 tablet (40 mg total) by mouth daily.   . meclizine (ANTIVERT) 25 MG tablet Take 25 mg by mouth 3 (three) times daily as needed for dizziness.   . methylPREDNISolone (MEDROL DOSEPAK) 4 MG TBPK tablet 6 tabs daily day 1, 5 tabs daily day 2, 4 tabs  daily day 3, 3 tabs daily day 4, 2 tabs daily day 5, 1 tab daily day 6   . omeprazole (PRILOSEC) 20 MG capsule Take 2 capsules (40 mg total) by mouth daily.   . pregabalin (LYRICA) 75 MG capsule Take 1 capsule (75 mg total) by mouth 2 (two) times daily.   . primidone (MYSOLINE) 50 MG tablet Take 1 tablet twice a day 06/17/2019: Patient stated he takes this "when he feel down"  . rosuvastatin (CRESTOR) 20 MG tablet Take 1 tablet (20 mg total) by mouth at bedtime.   . sertraline (ZOLOFT) 50 MG tablet Take 1 tablet (50 mg total) by mouth in the morning.   . vitamin B-12 (CYANOCOBALAMIN) 500 MCG tablet Take 500 mcg by mouth daily.    No facility-administered encounter medications on file as of 08/05/2019.   Allergies  Allergen Reactions  . Poison Oak Extract Rash    SDOH Screenings   Alcohol Screen:   . Last Alcohol Screening Score (AUDIT):   Depression (PHQ2-9): Low Risk   . PHQ-2 Score: 2  Financial Resource Strain:   . Difficulty of Paying Living Expenses:   Food Insecurity: No Food Insecurity  . Worried About Charity fundraiser in the Last Year: Never true  . Ran Out of Food in the Last Year: Never true  Housing: Low Risk   . Last Housing Risk Score: 0  Physical Activity:   . Days of Exercise per Week:   .  Minutes of Exercise per Session:   Social Connections:   . Frequency of Communication with Friends and Family:   . Frequency of Social Gatherings with Friends and Family:   . Attends Religious Services:   . Active Member of Clubs or Organizations:   . Attends Banker Meetings:   Marland Kitchen Marital Status:   Stress:   . Feeling of Stress :   Tobacco Use: Medium Risk  . Smoking Tobacco Use: Former Smoker  . Smokeless Tobacco Use: Never Used  Transportation Needs: No Transportation Needs  . Lack of Transportation (Medical): No  . Lack of Transportation (Non-Medical): No     Current Diagnosis/Assessment:  Goals Addressed            This Visit's Progress   .  Pharmacy Care Plan       CARE PLAN ENTRY  Current Barriers:  . Chronic Disease Management support, education, and care coordination needs related to HTN and HLD  Pharmacist Clinical Goal(s):  . Ensure safety, efficacy, and affordability of medications . Recommend maintaining BP <140/90 mmHg  . Recommend keeping sodium intake less than 1500 mg/day.  . Goal Cholesterol: TC <200, LDL <100, TG <150 HDL >50 mg/dL   Interventions: . Pharmacist coordinating refills of maintenance medications with mail-order pharmacy for greatest cost savings. Recommend continue to take medications as prescribed daily.  Marland Kitchen Pharmacist provided patient with updated medication list.  Patient Self Care Activities:  . Over the next 30 days, recommend following the Dash diet guidance of incorporating fruits, vegetables, whole-grains, low-fat dairy products, lean meats, legumes and nuts for optimal health. . Over the next month, recommend patient continue to check blood pressure daily for next month and provide for visits.   Please see past updates related to this goal by clicking on the "Past Updates" button in the selected goal         Hypertension   BP today is:  <140/90  Office blood pressures are  BP Readings from Last 3 Encounters:  07/10/19 124/70  07/07/19 128/78  06/25/19 138/84   Lab Results  Component Value Date   CREATININE 1.09 06/17/2019   CREATININE 0.85 07/02/2018   CREATININE 0.88 04/28/2018     Patient has failed these meds in the past: hctz 25 mg daily Patient is currently controlled on the following medications: amlodipine 10 mg daily, enalapril 20 mg daily, furosemide 40 mg daily, metoprolol tartrate 50 mg bid, Potassium Chloride daily Patient checks BP at home twice daily  Patient home BP readings are ranging:  134/75 pulse 71 08/04/2019 am 157/81 pulse 62 08/04/2019 pm   We discussed Medication and medication adherence. Patient ran out of metoprolol and furosemide. We  now have him refills at the pharmacy to pickup. He states that his blood pressure has been much better on his medications. He is checking twice daily at home.    Update 08/05/2019: Patient ate ham at his sister's house yesterday and that drove blood pressure last night. Patient reports no swelling or other symptoms. He also indicates that he has all of his medications. Pharmacist has asked him to bring all medications in at his next visit with Dr. Marina Goodell in July.   Plan  Continue current medications       and  Spinal Stenosis/Post herpetic neuralgia:    Patient has failed these meds in past: Lyrica, meloxicam 15 mg daily, oxycodone-apap,gabapentin 300 mg 2 caps po bid Patient is currently uncontrolled on the following medications: hydrocodone-apap 5/325 1-2  q4h prn - not using, , acetaminophen 650 mg q8h prn - not taking often, pregabalin 75 mg bid  We discussed:  Patient was taking gabapentin and pregabalin together. He didn't realize he was only supposed to be taking 1. Pharmacist counseled that they are not to be used together and patient removed it from the lineup.   Update 08/05/2019: Patient indicates that he is still having some pain from shingles. He stopped taking gabapentin at last phone call. Patient indicates that the pain is still there and the hospital told him it could last for 6 months.   Plan  Continue current medications     Follow up: 1 month

## 2019-09-03 NOTE — Chronic Care Management (AMB) (Deleted)
Chronic Care Management Pharmacy  Name: Jack Hodges  MRN: [id number] DOB: [birth year]  Chief Complaint/ HPI  Jack Hodges,  86 y.o. , male presents for their Initial CCM visit with the clinical pharmacist In office.  PCP : Abigail Miyamoto, MD  Their chronic conditions include: CAD, hx of CVA, HTN, Spinal stenosis, and aortic stenosis.   Office Visits: 06/25/2019 - routine follow-up. dTap ordered from Pharmacy.  06/11/2019- Patient had stopped taking amlodipine and enalapril. Stopped gabapentin and start Lyrica for shingles pain.   05/27/2019 - Gabapentin for shingles pain.  05/19/2019 - Started on valtrex and hydrocodone for shingles. 04/13/2019 - no anemia, chronically low platelets but stable, normal kidney and liver test, good cholesterol panel. 02/03/2019 - steroid injection for shoulder.    Consult Visit: 06/17/2019 - ED visit for chest pain. Ruled out cardiac origin. Likely post herpetic neuralgia.  04/10/2019 - artery stenosis recommended. BP elevated in office. 140/90 per patient at home. No med changes at this time.  02/13/2019 - Dr. Kirtland Bouchard would like patient on a statin for optimal management.     Medications: Outpatient Encounter Medications as of 09/04/2019  Medication Sig Note  . acetaminophen (TYLENOL) 650 MG CR tablet Take 650 mg by mouth every 8 (eight) hours as needed for pain.   Marland Kitchen amLODipine (NORVASC) 10 MG tablet Take 1 tablet (10 mg total) by mouth daily.   Marland Kitchen aspirin EC 81 MG tablet Take 81 mg by mouth daily.   . enalapril (VASOTEC) 20 MG tablet Take 1 tablet (20 mg total) by mouth daily.   Marland Kitchen ezetimibe (ZETIA) 10 MG tablet Take 1 tablet (10 mg total) by mouth daily.   . furosemide (LASIX) 40 MG tablet Take 1 tablet (40 mg total) by mouth daily.   . meclizine (ANTIVERT) 25 MG tablet Take 25 mg by mouth 3 (three) times daily as needed for dizziness.   . methylPREDNISolone (MEDROL DOSEPAK) 4 MG TBPK tablet 6 tabs daily day 1, 5 tabs daily day 2, 4 tabs  daily day 3, 3 tabs daily day 4, 2 tabs daily day 5, 1 tab daily day 6   . omeprazole (PRILOSEC) 20 MG capsule Take 2 capsules (40 mg total) by mouth daily.   . pregabalin (LYRICA) 75 MG capsule Take 1 capsule (75 mg total) by mouth 2 (two) times daily.   . primidone (MYSOLINE) 50 MG tablet Take 1 tablet twice a day 06/17/2019: Patient stated he takes this "when he feel down"  . rosuvastatin (CRESTOR) 20 MG tablet Take 1 tablet (20 mg total) by mouth at bedtime.   . sertraline (ZOLOFT) 50 MG tablet Take 1 tablet (50 mg total) by mouth in the morning.   . vitamin B-12 (CYANOCOBALAMIN) 500 MCG tablet Take 500 mcg by mouth daily.    No facility-administered encounter medications on file as of 09/04/2019.   Allergies  Allergen Reactions  . Poison Oak Extract Rash    SDOH Screenings   Alcohol Screen:   . Last Alcohol Screening Score (AUDIT):   Depression (PHQ2-9): Low Risk   . PHQ-2 Score: 2  Financial Resource Strain:   . Difficulty of Paying Living Expenses:   Food Insecurity: No Food Insecurity  . Worried About Programme researcher, broadcasting/film/video in the Last Year: Never true  . Ran Out of Food in the Last Year: Never true  Housing: Low Risk   . Last Housing Risk Score: 0  Physical Activity:   . Days of Exercise per Week:   .  Minutes of Exercise per Session:   Social Connections:   . Frequency of Communication with Friends and Family:   . Frequency of Social Gatherings with Friends and Family:   . Attends Religious Services:   . Active Member of Clubs or Organizations:   . Attends Banker Meetings:   Marland Kitchen Marital Status:   Stress:   . Feeling of Stress :   Tobacco Use: Medium Risk  . Smoking Tobacco Use: Former Smoker  . Smokeless Tobacco Use: Never Used  Transportation Needs: No Transportation Needs  . Lack of Transportation (Medical): No  . Lack of Transportation (Non-Medical): No     Current Diagnosis/Assessment:  Goals Addressed   None     Hypertension   BP today  is:  <140/90  Office blood pressures are  BP Readings from Last 3 Encounters:  07/10/19 124/70  07/07/19 128/78  06/25/19 138/84   Lab Results  Component Value Date   CREATININE 1.09 06/17/2019   CREATININE 0.85 07/02/2018   CREATININE 0.88 04/28/2018     Patient has failed these meds in the past: hctz 25 mg daily Patient is currently controlled on the following medications: amlodipine 10 mg daily, enalapril 20 mg daily, furosemide 40 mg daily, metoprolol tartrate 50 mg bid, Potassium Chloride daily Patient checks BP at home twice daily  Patient home BP readings are ranging: 133/73 pulse 68  We discussed Medication and medication adherence. Patient ran out of metoprolol and furosemide. We now have him refills at the pharmacy to pickup. He states that his blood pressure has been much better on his medications. He is checking twice daily at home.   Plan  Continue current medications   CAD/Hx of CVA/Dyslipidemia   Lipid Panel  No results found for: CHOL, TRIG, HDL, CHOLHDL, VLDL, LDLCALC, LDLDIRECT, LABVLDL   The ASCVD Risk score Denman George DC Jr., et al., 2013) failed to calculate for the following reasons:   The 2013 ASCVD risk score is only valid for ages 62 to 73   The patient has a prior MI or stroke diagnosis   Patient has failed these meds in past: simvastatin 80 mg Patient is currently controlled on the following medications: zetia 10 mg, aspirin ec 81 mg, rosuvastatin 20 mg daily.  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 He still mows his yard. Patient takes medications and last cholseterol panel was good.   Plan  Continue current medications     and  Spinal Stenosis/Post herpetic neuralgia:    Patient has failed these meds in past: Lyrica, meloxicam 15 mg daily, oxycodone-apap,gabapentin 300 mg 2 caps po bid Patient is currently uncontrolled on the following medications: hydrocodone-apap 5/325 1-2 q4h prn - not using, ,  acetaminophen 650 mg q8h prn - not taking often, pregabalin 75 mg bid  We discussed:  Patient was taking gabapentin and pregabalin together. He didn't realize he was only supposed to be taking 1. Pharmacist counseled that they are not to be used together and patient removed it from the lineup.   Plan  Continue current medications   Heartburn   Patient has failed these meds in past: none indicated Patient is currently controlled on the following medications: omeprazole  We discussed:  Patient states that heartburn is well controlled with this medication. He has no issue with it since beginning.   Plan  Continue current medications   Mood Stabilizer:   Patient has failed these meds in past: alprazolam 0.5 mg daily  prn, trazodone 100 mg qhs, trintellix 5 mg daily Patient is currently controlled on the following medications: sertraline 50 mg tablet, primidone 50 mg daily prn  We discussed:  Patient lost his wife and son in the past few years. He said that he feels like he's been under water since those losses. He inidicates that the sertraline is helping a lot.   Plan  Continue current medications   Health Maintenance   Patient is currently controlled on the following medications:   Vitamin b-12 500 mcg - daily Meclizine 25 mg daily prn - dizziness  We discussed:  Patient states that he is not taking the meclizine often. Pharmacist counseled on somnolence and fall risk with meclizine.   Plan  Continue current medications  Vaccines   Reviewed and discussed patient's vaccination history. Hasn't taken COVID vaccine. He's concerned about the new vaccine. Just received dTap vaccine last Friday.   Immunization History  Administered Date(s) Administered  . Fluad Quad(high Dose 65+) 12/10/2018  . Pneumococcal Conjugate-13 12/20/2017  . Tdap 07/03/2019    Plan  Recommended patient receive annual flu vaccine in office.   Medication Management   Pt uses [facility] for all medications Patient cannot use a pill box due to tremor.   Pt endorses good compliance but had been out of a few of his medications for several days.   We discussed:getting all prescriptions lined up with mail-order for cost savings. Patient is going to pick up his furosemide and metoprolol locally today to avoid being out of them.   Plan Pharmacist will monitor patient for adherence and medication efficacy.    Follow up: 1 month

## 2019-09-04 ENCOUNTER — Telehealth: Payer: Medicare Other

## 2019-09-07 ENCOUNTER — Other Ambulatory Visit: Payer: Self-pay

## 2019-09-10 ENCOUNTER — Ambulatory Visit: Payer: Medicare Other

## 2019-09-10 DIAGNOSIS — E785 Hyperlipidemia, unspecified: Secondary | ICD-10-CM

## 2019-09-10 DIAGNOSIS — I10 Essential (primary) hypertension: Secondary | ICD-10-CM

## 2019-09-10 NOTE — Chronic Care Management (AMB) (Signed)
Chronic Care Management Pharmacy  Name: Jack Hodges  MRN: [id number] DOB: 07-21-1930  Chief Complaint/ HPI  Jack Hodges,  86 y.o. , male presents for their Follow-Up CCM visit with the clinical pharmacist via telephone due to COVID-19 Pandemic.  PCP : Abigail Miyamoto, MD  Their chronic conditions include: CAD, hx of CVA, HTN, Spinal stenosis, and aortic stenosis.   Office Visits: 06/25/2019 - routine follow-up. dTap ordered from Pharmacy.  06/11/2019- Patient had stopped taking amlodipine and enalapril. Stopped gabapentin and start Lyrica for shingles pain.   05/27/2019 - Gabapentin for shingles pain.  05/19/2019 - Started on valtrex and hydrocodone for shingles. 04/13/2019 - no anemia, chronically low platelets but stable, normal kidney and liver test, good cholesterol panel. 02/03/2019 - steroid injection for shoulder.    Consult Visit: 06/17/2019 - ED visit for chest pain. Ruled out cardiac origin. Likely post herpetic neuralgia.  04/10/2019 - artery stenosis recommended. BP elevated in office. 140/90 per patient at home. No med changes at this time.  02/13/2019 - Dr. Kirtland Bouchard would like patient on a statin for optimal management.     Medications: Outpatient Encounter Medications as of 09/10/2019  Medication Sig Note  . acetaminophen (TYLENOL) 650 MG CR tablet Take 650 mg by mouth every 8 (eight) hours as needed for pain.   Marland Kitchen amLODipine (NORVASC) 10 MG tablet Take 1 tablet (10 mg total) by mouth daily.   Marland Kitchen aspirin EC 81 MG tablet Take 81 mg by mouth daily.   . enalapril (VASOTEC) 20 MG tablet Take 1 tablet (20 mg total) by mouth daily.   Marland Kitchen ezetimibe (ZETIA) 10 MG tablet Take 1 tablet (10 mg total) by mouth daily.   . furosemide (LASIX) 40 MG tablet Take 1 tablet (40 mg total) by mouth daily.   . meclizine (ANTIVERT) 25 MG tablet Take 25 mg by mouth 3 (three) times daily as needed for dizziness.   . meloxicam (MOBIC) 15 MG tablet Take 15 mg by mouth daily.   .  methylPREDNISolone (MEDROL DOSEPAK) 4 MG TBPK tablet 6 tabs daily day 1, 5 tabs daily day 2, 4 tabs daily day 3, 3 tabs daily day 4, 2 tabs daily day 5, 1 tab daily day 6   . omeprazole (PRILOSEC) 20 MG capsule Take 2 capsules (40 mg total) by mouth daily.   . potassium chloride (KLOR-CON) 10 MEQ tablet Take 10 mEq by mouth daily.   . pregabalin (LYRICA) 75 MG capsule Take 1 capsule (75 mg total) by mouth 2 (two) times daily.   . primidone (MYSOLINE) 50 MG tablet Take 1 tablet twice a day 06/17/2019: Patient stated he takes this "when he feel down"  . rosuvastatin (CRESTOR) 20 MG tablet Take 1 tablet (20 mg total) by mouth at bedtime.   . sertraline (ZOLOFT) 50 MG tablet Take 1 tablet (50 mg total) by mouth in the morning.   . vitamin B-12 (CYANOCOBALAMIN) 500 MCG tablet Take 500 mcg by mouth daily.    No facility-administered encounter medications on file as of 09/10/2019.   Allergies  Allergen Reactions  . Poison Oak Extract Rash    SDOH Screenings   Alcohol Screen:   . Last Alcohol Screening Score (AUDIT):   Depression (PHQ2-9): Low Risk   . PHQ-2 Score: 2  Financial Resource Strain:   . Difficulty of Paying Living Expenses:   Food Insecurity: No Food Insecurity  . Worried About Programme researcher, broadcasting/film/video in the Last Year: Never true  .  Ran Out of Food in the Last Year: Never true  Housing: Low Risk   . Last Housing Risk Score: 0  Physical Activity:   . Days of Exercise per Week:   . Minutes of Exercise per Session:   Social Connections:   . Frequency of Communication with Friends and Family:   . Frequency of Social Gatherings with Friends and Family:   . Attends Religious Services:   . Active Member of Clubs or Organizations:   . Attends Banker Meetings:   Marland Kitchen Marital Status:   Stress:   . Feeling of Stress :   Tobacco Use: Medium Risk  . Smoking Tobacco Use: Former Smoker  . Smokeless Tobacco Use: Never Used  Transportation Needs: No Transportation Needs  . Lack  of Transportation (Medical): No  . Lack of Transportation (Non-Medical): No     Current Diagnosis/Assessment:  Goals Addressed            This Visit's Progress   . Pharmacy Care Plan       CARE PLAN ENTRY  Current Barriers:  . Chronic Disease Management support, education, and care coordination needs related to Hypertension and Hyperlipidemia   Hypertension . Pharmacist Clinical Goal(s): o Over the next 90 days, patient will work with PharmD and providers to maintain BP goal <140/90 . Current regimen:  o amlodipine 10 mg daily o enalapril 20 mg daily o furosemide 40 mg daily o metoprolol tartrate 50 mg bid o Potassium Chloride daily . Interventions: o Recommend patient continue taking medications as prescribed.  . Patient self care activities - Over the next 90 days, patient will: o Check BP daily, document, and provide at future appointments o Ensure daily salt intake < 2300 mg/day  Hyperlipidemia . Pharmacist Clinical Goal(s): o Over the next 90 days, patient will work with PharmD and providers to maintain LDL goal < 70 . Current regimen:  o zetia 10 mg o aspirin ec 81 mg o rosuvastatin 20 mg daily. . Interventions: o Recommend patient continue to take medications as prescribed.  . Patient self care activities - Over the next 90 days, patient will: o Continue to follow-up with Dr. Marina Goodell and cardiology and contact providers as needed. .   Medication management . Pharmacist Clinical Goal(s): o Over the next 90 days, patient will work with PharmD and providers to achieve optimal medication adherence . Current pharmacy: [facility] . Interventions o Comprehensive medication review performed. o Continue current medication management strategy . Patient self care activities - Over the next 90 days, patient will: o Focus on medication adherence by continuing to take medications as prescribed.  o Take medications as prescribed o Report any questions or  concerns to PharmD and/or provider(s)  Please see past updates related to this goal by clicking on the "Past Updates" button in the selected goal          Hypertension   BP today is:  <130/80  Office blood pressures are  BP Readings from Last 3 Encounters:  07/10/19 124/70  07/07/19 128/78  06/25/19 138/84   Lab Results  Component Value Date   CREATININE 1.09 06/17/2019   CREATININE 0.85 07/02/2018   CREATININE 0.88 04/28/2018     Patient has failed these meds in the past: hctz 25 mg daily Patient is currently controlled on the following medications: amlodipine 10 mg daily, enalapril 20 mg daily, furosemide 40 mg daily, metoprolol tartrate 50 mg bid, Potassium Chloride daily Patient checks BP at home  twice daily  Patient home BP readings are ranging: 133/73 pulse 68  We discussed Medication and medication adherence. Patient ran out of metoprolol and furosemide. We now have him refills at the pharmacy to pickup. He states that his blood pressure has been much better on his medications. He is checking twice daily at home.    Update 09/10/2019 - BP is doing good per patient. 113/70 per patient.   Plan  Continue current medications   CAD/Hx of CVA/Dyslipidemia   Lipid Panel  No results found for: CHOL, TRIG, HDL, CHOLHDL, VLDL, LDLCALC, LDLDIRECT, LABVLDL   The ASCVD Risk score Denman George DC Jr., et al., 2013) failed to calculate for the following reasons:   The 2013 ASCVD risk score is only valid for ages 49 to 7   The patient has a prior MI or stroke diagnosis   Patient has failed these meds in past: simvastatin 80 mg Patient is currently controlled on the following medications: zetia 10 mg, aspirin ec 81 mg, rosuvastatin 20 mg daily.  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 He still mows his yard. Patient takes medications and last cholseterol panel was good.   09/10/2019 - Cardiology visit next Monday about a valve. He states  things are going well.   Plan  Continue current medications    and  Spinal Stenosis/Post herpetic neuralgia:    Patient has failed these meds in past: Lyrica, meloxicam 15 mg daily, oxycodone-apap,gabapentin 300 mg 2 caps po bid Patient is currently uncontrolled on the following medications: hydrocodone-apap 5/325 1-2 q4h prn - not using, acetaminophen 650 mg q8h prn - not taking often, pregabalin 75 mg bid  We discussed:  Patient was taking gabapentin and pregabalin together. He didn't realize he was only supposed to be taking 1. Pharmacist counseled that they are not to be used together and patient removed it from the lineup.   Update 09/10/2019 - Pain is improved. He still has shingles rash. Patient is hoping he can stop Lyrica soon.   Plan  Continue current medications   Heartburn   Patient has failed these meds in past: none indicated Patient is currently controlled on the following medications: omeprazole  We discussed:  Patient states that heartburn is well controlled with this medication. He has no issue with it since beginning.    Update 09/11/2019 - He says he can't tolerate missing doses or coming off of PPI.   Plan  Continue current medications   Mood Stabilizer:   Patient has failed these meds in past: alprazolam 0.5 mg daily prn, trazodone 100 mg qhs, trintellix 5 mg daily Patient is currently controlled on the following medications: sertraline 50 mg tablet, primidone 50 mg daily prn  We discussed:  Patient lost his wife and son in the past few years. He said that he feels like he's been under water since those losses. He inidicates that the sertraline is helping a lot.   Update 09/10/2019 - Patient reports stable on medications.   Plan  Continue current medications   Health Maintenance   Patient is currently controlled on the following medications:   Vitamin b-12 500 mcg - daily Meclizine 25 mg daily prn - dizziness  We discussed:  Patient states  that he is not taking the meclizine often. Pharmacist counseled on somnolence and fall risk with meclizine.   Update 09/10/2019 - Patient states he has been dealing with vertigo since he fell in the hospital years ago and hit his head.  States he uses caution walking and getting on/off lawn mower to avoid falls.   Plan  Continue current medications  Vaccines   Reviewed and discussed patient's vaccination history. Hasn't taken COVID vaccine. He's concerned about the new vaccine. Just received dTap vaccine last Friday.   Update 09/11/2019 - Patient still uncertain about taking COVID vaccine. There is no recorded Pneumovax in the chart.   Immunization History  Administered Date(s) Administered  . Fluad Quad(high Dose 65+) 12/10/2018  . Pneumococcal Conjugate-13 12/20/2017  . Tdap 07/03/2019    Plan  Recommend patient receive annual flu vaccine in office. Recommend Pneumovax 23 if not received outside of office.    Medication Management   Pt uses [facility] for all medications Patient cannot use a pill box due to tremor.   Pt endorses good compliance but had been out of a few of his medications for several days.   We discussed:getting all prescriptions lined up with mail-order for cost savings. Patient is going to pick up his furosemide and metoprolol locally today to avoid being out of them.   Update 09/10/2019 - Patient reports having his medications from mail order for free with no issue. He is going to take all of his medication in at Cardiology appointment. I've also asked patient to bring all meds in when he comes to see Dr. Henrene Pastor next. I will help him review and make sure he understands how he takes each one.   Plan Pharmacist will monitor patient for adherence and medication efficacy.    Follow up: 1 month

## 2019-09-10 NOTE — Patient Instructions (Addendum)
Visit Information  Goals Addressed   None     The patient verbalized understanding of instructions provided today and declined a print copy of patient instruction materials.   Telephone follow up appointment with pharmacy team member scheduled for: July 2021  Juliane Lack, PharmD, [facility] Clinical Pharmacist [facility] [phone] (office) [phone] (mobile)   Fall Prevention in the Home, Adult Falls can cause injuries. They can happen to people of all ages. There are many things you can do to make your home safe and to help prevent falls. Ask for help when making these changes, if needed. What actions can I take to prevent falls? General Instructions  Use good lighting in all rooms. Replace any light bulbs that burn out.  Turn on the lights when you go into a dark area. Use night-lights.  Keep items that you use often in easy-to-reach places. Lower the shelves around your home if necessary.  Set up your furniture so you have a clear path. Avoid moving your furniture around.  Do not have throw rugs and other things on the floor that can make you trip.  Avoid walking on wet floors.  If any of your floors are uneven, fix them.  Add color or contrast paint or tape to clearly mark and help you see: ? Any grab bars or handrails. ? First and last steps of stairways. ? Where the edge of each step is.  If you use a stepladder: ? Make sure that it is fully opened. Do not climb a closed stepladder. ? Make sure that both sides of the stepladder are locked into place. ? Ask someone to hold the stepladder for you while you use it.  If there are any pets around you, be aware of where they are. What can I do in the bathroom?      Keep the floor dry. Clean up any water that spills onto the floor as soon as it happens.  Remove soap buildup in the tub or shower regularly.  Use non-skid mats or decals on the floor of the tub or shower.  Attach bath mats  securely with double-sided, non-slip rug tape.  If you need to sit down in the shower, use a plastic, non-slip stool.  Install grab bars by the toilet and in the tub and shower. Do not use towel bars as grab bars. What can I do in the bedroom?  Make sure that you have a light by your bed that is easy to reach.  Do not use any sheets or blankets that are too big for your bed. They should not hang down onto the floor.  Have a firm chair that has side arms. You can use this for support while you get dressed. What can I do in the kitchen?  Clean up any spills right away.  If you need to reach something above you, use a strong step stool that has a grab bar.  Keep electrical cords out of the way.  Do not use floor polish or wax that makes floors slippery. If you must use wax, use non-skid floor wax. What can I do with my stairs?  Do not leave any items on the stairs.  Make sure that you have a light switch at the top of the stairs and the bottom of the stairs. If you do not have them, ask someone to add them for you.  Make sure that there are handrails on both sides of the stairs, and use them. Fix  handrails that are broken or loose. Make sure that handrails are as long as the stairways.  Install non-slip stair treads on all stairs in your home.  Avoid having throw rugs at the top or bottom of the stairs. If you do have throw rugs, attach them to the floor with carpet tape.  Choose a carpet that does not hide the edge of the steps on the stairway.  Check any carpeting to make sure that it is firmly attached to the stairs. Fix any carpet that is loose or worn. What can I do on the outside of my home?  Use bright outdoor lighting.  Regularly fix the edges of walkways and driveways and fix any cracks.  Remove anything that might make you trip as you walk through a door, such as a raised step or threshold.  Trim any bushes or trees on the path to your home.  Regularly check to  see if handrails are loose or broken. Make sure that both sides of any steps have handrails.  Install guardrails along the edges of any raised decks and porches.  Clear walking paths of anything that might make someone trip, such as tools or rocks.  Have any leaves, snow, or ice cleared regularly.  Use sand or salt on walking paths during winter.  Clean up any spills in your garage right away. This includes grease or oil spills. What other actions can I take?  Wear shoes that: ? Have a low heel. Do not wear high heels. ? Have rubber bottoms. ? Are comfortable and fit you well. ? Are closed at the toe. Do not wear open-toe sandals.  Use tools that help you move around (mobility aids) if they are needed. These include: ? Canes. ? Walkers. ? Scooters. ? Crutches.  Review your medicines with your doctor. Some medicines can make you feel dizzy. This can increase your chance of falling. Ask your doctor what other things you can do to help prevent falls. Where to find more information  Centers for Disease Control and Prevention, STEADI: https://garcia.biz/  [employer] on Aging: BrainJudge.co.uk Contact a doctor if:  You are afraid of falling at home.  You feel weak, drowsy, or dizzy at home.  You fall at home. Summary  There are many simple things that you can do to make your home safe and to help prevent falls.  Ways to make your home safe include removing tripping hazards and installing grab bars in the bathroom.  Ask for help when making these changes in your home. This information is not intended to replace advice given to you by your health care provider. Make sure you discuss any questions you have with your health care provider. Document Revised: 07/03/2018 Document Reviewed: 10/25/2016 Elsevier Patient Education  2020 [employer].

## 2019-09-14 ENCOUNTER — Ambulatory Visit (INDEPENDENT_AMBULATORY_CARE_PROVIDER_SITE_OTHER): Payer: Medicare Other | Admitting: Cardiology

## 2019-09-14 ENCOUNTER — Encounter: Payer: Self-pay | Admitting: Cardiology

## 2019-09-14 ENCOUNTER — Other Ambulatory Visit: Payer: Self-pay

## 2019-09-14 VITALS — BP 112/70 | HR 55 | Ht 64.0 in | Wt 170.0 lb

## 2019-09-14 DIAGNOSIS — Z951 Presence of aortocoronary bypass graft: Secondary | ICD-10-CM | POA: Diagnosis not present

## 2019-09-14 DIAGNOSIS — I2510 Atherosclerotic heart disease of native coronary artery without angina pectoris: Secondary | ICD-10-CM

## 2019-09-14 DIAGNOSIS — I350 Nonrheumatic aortic (valve) stenosis: Secondary | ICD-10-CM | POA: Diagnosis not present

## 2019-09-14 DIAGNOSIS — R0609 Other forms of dyspnea: Secondary | ICD-10-CM

## 2019-09-14 DIAGNOSIS — I10 Essential (primary) hypertension: Secondary | ICD-10-CM | POA: Diagnosis not present

## 2019-09-14 DIAGNOSIS — R0600 Dyspnea, unspecified: Secondary | ICD-10-CM | POA: Diagnosis not present

## 2019-09-14 NOTE — Progress Notes (Signed)
Cardiology Office Note:    Date:  09/14/2019   ID:  Jack Hodges, DOB 10/04/1930, MRN [id number]  PCP:  Abigail Miyamoto, MD  Cardiologist:  Gypsy Balsam, MD    Referring MD: Abigail Miyamoto,*   Chief Complaint  Patient presents with  . Follow-up    2 MO FU   I am short of breath  History of Present Illness:    Jack Hodges is a 86 y.o. male with past medical history significant for coronary artery disease, status post coronary artery bypass graft done in 1996, peripheral vascular disease with a latest estimation done in April 16 showing 75 to 99% stenosis of left distal SFA with monophasic flow distally.  Remote history of CVA, essential hypertension, dyslipidemia.  He also have aortic stenosis which appears to be severe.  We will be discussing this problem for months already initially he wanted to simply observe the situation he was on pushing towards any intervention however now he shortness of breath became much worse and he said something need to be done.  He complained of having exertional shortness of breath, no chest pain tightness squeezing pressure burning chest, no dizziness no passing out.  I did review his echocardiogram from May and it looks like he is reaching stage D1 of his aortic stenosis.  We explore again options for the situation sternotomy and aortic valve replacements probably out of the question and he simply does not want to have it, however he is willing to explore possibility of TAVR.  I will refer him to Dr. Excell Seltzer for evaluation.  Past Medical History:  Diagnosis Date  . Aortic stenosis 05/02/2017  . Arthritis   . Bilateral foot-drop 01/18/2017  . Cellulitis of left hand 07/07/2019  . Coronary artery disease involving native coronary artery of native heart without angina pectoris 11/01/2015   Overview:  Bypass surgery in 1996 Overview:  Overview:  Bypass surgery in 1996  Last Assessment & Plan:  Continue his current regimen.  Follow up as noted.    . CVA (cerebral vascular accident) (HCC) 04/03/2016   Last Assessment & Plan:  The patient underwent extensive CVA workup.  MRI is negative.  He does have aortic stenosis by echocardiogram.  Upon review of Care everywhere he is followed by Dr. Bing Matter, cardiologist for this.  Carotid ultrasound negative for hemodynamically significant stenosis.  Lipid panel within normal limits.  He has not had any further episodes of dizziness or nausea vomiting.   . Dementia (HCC)   . Depression   . Dizziness 04/19/2016  . DNR (do not resuscitate) 04/03/2016   Overview:  I had an extensive discussion with the patient on 04/03/2016 regarding his end of life wishes.  He and his daughter agree that do not resuscitate is in keeping with his beliefs.  . Dyslipidemia 11/01/2015   Last Assessment & Plan:  Formatting of this note might be different from the original. Cholesterol 136   Triglycerides  83  HDL  50.1  LDL Calculated  69  VLDL Cholesterol Cal  17   Continue statin.  Marland Kitchen Dyspnea on exertion 06/30/2018  . Essential hypertension 11/01/2015   Last Assessment & Plan:  Resume home medications.  Follow-up as noted above.  Marland Kitchen Herpes zoster 05/19/2019  . Hypertension   . Neuropathy   . Other chest pain   . Recurrent falls 06/25/2019  . Severe aortic stenosis   . Sleep apnea   . Spinal stenosis of lumbar region with neurogenic claudication  01/18/2017  . 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    Past Surgical History:  Procedure Laterality Date  . CARDIAC SURGERY      Current Medications: Current Meds  Medication Sig  . acetaminophen (TYLENOL) 650 MG CR tablet Take 650 mg by mouth every 8 (eight) hours as needed for pain.  Marland Kitchen amLODipine (NORVASC) 10 MG tablet Take 1 tablet (10 mg total) by mouth daily.  Marland Kitchen aspirin EC 81 MG tablet Take 81 mg by mouth daily.  . enalapril (VASOTEC) 20 MG tablet Take 1 tablet (20 mg total) by mouth daily.  Marland Kitchen ezetimibe (ZETIA)  10 MG tablet Take 1 tablet (10 mg total) by mouth daily.  . furosemide (LASIX) 40 MG tablet Take 1 tablet (40 mg total) by mouth daily.  . meclizine (ANTIVERT) 25 MG tablet Take 25 mg by mouth 3 (three) times daily as needed for dizziness.  . meloxicam (MOBIC) 15 MG tablet Take 15 mg by mouth daily.  . methylPREDNISolone (MEDROL DOSEPAK) 4 MG TBPK tablet 6 tabs daily day 1, 5 tabs daily day 2, 4 tabs daily day 3, 3 tabs daily day 4, 2 tabs daily day 5, 1 tab daily day 6  . omeprazole (PRILOSEC) 20 MG capsule Take 2 capsules (40 mg total) by mouth daily.  . potassium chloride (KLOR-CON) 10 MEQ tablet Take 10 mEq by mouth daily.  . pregabalin (LYRICA) 75 MG capsule Take 1 capsule (75 mg total) by mouth 2 (two) times daily.  . primidone (MYSOLINE) 50 MG tablet Take 1 tablet twice a day  . rosuvastatin (CRESTOR) 20 MG tablet Take 1 tablet (20 mg total) by mouth at bedtime.  . sertraline (ZOLOFT) 50 MG tablet Take 1 tablet (50 mg total) by mouth in the morning.  . vitamin B-12 (CYANOCOBALAMIN) 500 MCG tablet Take 500 mcg by mouth daily.     Allergies:   Poison oak extract   Social History   Socioeconomic History  . Marital status: Widowed    Spouse name: Not on file  . Number of children: 2  . Years of education: Not on file  . Highest education level: Not on file  Occupational History  . Occupation: retired  Tobacco Use  . Smoking status: Former Smoker    Types: Cigarettes, Cigars    Quit date: 05/02/1962    Years since quitting: 57.4  . Smokeless tobacco: Never Used  Substance and Sexual Activity  . Alcohol use: No  . Drug use: No  . Sexual activity: Not Currently  Other Topics Concern  . Not on file  Social History Narrative  . Not on file   Social Determinants of Health   Financial Resource Strain:   . Difficulty of Paying Living Expenses:   Food Insecurity: No Food Insecurity  . Worried About Programme researcher, broadcasting/film/video in the Last Year: Never true  . Ran Out of Food in the Last  Year: Never true  Transportation Needs: No Transportation Needs  . Lack of Transportation (Medical): No  . Lack of Transportation (Non-Medical): No  Physical Activity:   . Days of Exercise per Week:   . Minutes of Exercise per Session:   Stress:   . Feeling of Stress :   Social Connections:   . Frequency of Communication with Friends and Family:   . Frequency of Social Gatherings with Friends and Family:   . Attends Religious Services:   . Active Member of  Clubs or Organizations:   . Attends Archivist Meetings:   Marland Kitchen Marital Status:      Family History: The patient's family history includes Arthritis in his brother and father; Cancer in his brother and mother; Diabetes in his mother; Hypertension in his father; Kidney disease in his mother; Migraines in his father; Sleep disorder in his father and mother; Stroke in his mother. ROS:   Please see the history of present illness.    All 14 point review of systems negative except as described per history of present illness  EKGs/Labs/Other Studies Reviewed:      Recent Labs: 06/17/2019: ALT 12; B Natriuretic Peptide 154.2; BUN 12; Creatinine, Ser 1.09; Potassium 3.2; Sodium 137 07/17/2019: Hemoglobin 13.5; Platelets 163  Recent Lipid Panel No results found for: CHOL, TRIG, HDL, CHOLHDL, VLDL, LDLCALC, LDLDIRECT  Physical Exam:    VS:  BP 112/70 (BP Location: Left Arm, Patient Position: Sitting, Cuff Size: Normal)   Pulse (!) 55   Ht 5\' 4"  (1.626 m)   Wt 170 lb (77.1 kg)   SpO2 97%   BMI 29.18 kg/m     Wt Readings from Last 3 Encounters:  09/14/19 170 lb (77.1 kg)  07/10/19 172 lb 12.8 oz (78.4 kg)  07/07/19 174 lb (78.9 kg)     GEN:  Well nourished, well developed in no acute distress HEENT: Normal NECK: No JVD; N bilateral bruit LYMPHATICS: No lymphadenopathy CARDIAC: RRR, systolic ejection murmur grade 3/6 best heard in the right upper portion of the sternum, S2 is still present seems to be single., no rubs,  no gallops RESPIRATORY:  Clear to auscultation without rales, wheezing or rhonchi  ABDOMEN: Soft, non-tender, non-distended MUSCULOSKELETAL:  No edema; No deformity  SKIN: Warm and dry LOWER EXTREMITIES: no swelling NEUROLOGIC:  Alert and oriented x 3 PSYCHIATRIC:  Normal affect   ASSESSMENT:    1. Nonrheumatic aortic valve stenosis   2. Severe aortic stenosis   3. Essential hypertension   4. Coronary artery disease involving native coronary artery of native heart without angina pectoris   5. Status post coronary artery bypass graft   6. Dyspnea on exertion    PLAN:    In order of problems listed above:  1. Nonrheumatic arctic valve stenosis with last estimation done on May 10 showing calculated valve area of 0.82 cm, mean gradient of 31 mmHg, his dimensionless index was 0.24.  He stroke-volume index was 41, his blood pressure was normal during the time of the study.  So he is most likely stage D1.  He will be referred to Dr. Burt Knack for evaluation for possible TAVR.  I explained to him that as a part of evaluation he will also have to look at his peripheral vasculature which is critically essential with his abnormal recent arterial duplex of lower extremities.  The fact that he developed symptoms of shortness of breath to make his prognosis poor without intervention.  At the same time he is somewhat elevated risk for intervention which she understands. 2. Essential hypertension: His blood pressure today is well controlled 112/70, he brought blood pressure measurements to me all looking normal. 3. Coronary disease status post coronary artery bypass graft many years ago I explained to him also that the part of evaluation will include cardiac catheterization. 4. Dyspnea on exertion most likely related to his aortic valve stenosis.  But will also evaluate him for coronary artery disease.   Medication Adjustments/Labs and Tests Ordered: Current medicines are reviewed at length  with the  patient today.  Concerns regarding medicines are outlined above.  Orders Placed This Encounter  Procedures  . Ambulatory referral to [facility] (only at [facility])   Medication changes: No orders of the defined types were placed in this encounter.   Signed, Georgeanna Lea, MD, [facility] 09/14/2019 1:06 PM    [facility]

## 2019-09-14 NOTE — Patient Instructions (Signed)
Medication Instructions:  Your physician recommends that you continue on your current medications as directed. Please refer to the Current Medication list given to you today.  *If you need a refill on your cardiac medications before your next appointment, please call your pharmacy*   Lab Work: None. If you have labs (blood work) drawn today and your tests are completely normal, you will receive your results only by: Marland Kitchen MyChart Message (if you have MyChart) OR . A paper copy in the mail If you have any lab test that is abnormal or we need to change your treatment, we will call you to review the results.   Testing/Procedures: None.    Follow-Up: At [facility], you and your health needs are our priority.  As part of our continuing mission to provide you with exceptional heart care, we have created designated Provider Care Teams.  These Care Teams include your primary Cardiologist (physician) and Advanced Practice Providers (APPs -  Physician Assistants and Nurse Practitioners) who all work together to provide you with the care you need, when you need it.  We recommend signing up for the patient portal called "MyChart".  Sign up information is provided on this After Visit Summary.  MyChart is used to connect with patients for Virtual Visits (Telemedicine).  Patients are able to view lab/test results, encounter notes, upcoming appointments, etc.  Non-urgent messages can be sent to your provider as well.   To learn more about what you can do with MyChart, go to [email or web address].    Your next appointment:   3 month(s)  The format for your next appointment:   In Person  Provider:   Gypsy Balsam, MD   Other Instructions  Dr. Bing Matter has referred you to see Dr. Excell Seltzer. His office should call you within a week if not please call our office.

## 2019-09-24 ENCOUNTER — Ambulatory Visit: Payer: Medicare Other

## 2019-09-24 ENCOUNTER — Encounter: Payer: Self-pay | Admitting: Legal Medicine

## 2019-09-24 ENCOUNTER — Ambulatory Visit (INDEPENDENT_AMBULATORY_CARE_PROVIDER_SITE_OTHER): Payer: Medicare Other | Admitting: Legal Medicine

## 2019-09-24 ENCOUNTER — Other Ambulatory Visit: Payer: Self-pay

## 2019-09-24 VITALS — BP 118/64 | HR 53 | Temp 97.6°F | Resp 16 | Ht 66.0 in | Wt 170.8 lb

## 2019-09-24 DIAGNOSIS — E538 Deficiency of other specified B group vitamins: Secondary | ICD-10-CM

## 2019-09-24 DIAGNOSIS — E785 Hyperlipidemia, unspecified: Secondary | ICD-10-CM | POA: Diagnosis not present

## 2019-09-24 DIAGNOSIS — R0600 Dyspnea, unspecified: Secondary | ICD-10-CM | POA: Diagnosis not present

## 2019-09-24 DIAGNOSIS — Z6827 Body mass index (BMI) 27.0-27.9, adult: Secondary | ICD-10-CM

## 2019-09-24 DIAGNOSIS — M48062 Spinal stenosis, lumbar region with neurogenic claudication: Secondary | ICD-10-CM

## 2019-09-24 DIAGNOSIS — I700 Atherosclerosis of aorta: Secondary | ICD-10-CM

## 2019-09-24 DIAGNOSIS — I350 Nonrheumatic aortic (valve) stenosis: Secondary | ICD-10-CM | POA: Diagnosis not present

## 2019-09-24 DIAGNOSIS — I10 Essential (primary) hypertension: Secondary | ICD-10-CM | POA: Diagnosis not present

## 2019-09-24 DIAGNOSIS — R0609 Other forms of dyspnea: Secondary | ICD-10-CM

## 2019-09-24 HISTORY — DX: Atherosclerosis of aorta: I70.0

## 2019-09-24 HISTORY — DX: Body mass index (BMI) 27.0-27.9, adult: Z68.27

## 2019-09-24 MED ORDER — MELOXICAM 15 MG PO TABS: 15.0000 mg | ORAL_TABLET | Freq: Every day | ORAL | 2 refills | Status: DC

## 2019-09-24 NOTE — Progress Notes (Signed)
Subjective:  Patient ID: Jack Hodges, male    DOB: Mar 23, 1931  Age: 86 y.o. MRN: [id number]  Chief Complaint  Patient presents with  . Hypertension  . Hyperlipidemia    HPI: chronic visit.  Patient presents for follow up of hypertension.  Patient tolerating vasotec, amlodipine well with side effects.  Patient was diagnosed with hypertension 2010 so has been treated for hypertension for 10 years.Patient is working on maintaining diet and exercise regimen and follows up as directed. Complication include CAD.  Patient presents with hyperlipidemia.  Compliance with treatment has been good; patient takes medicines as directed, maintains low cholesterol diet, follows up as directed, and maintains exercise regimen.  Patient is using crestor without problems.    AS: plans for TAVR with Dr. Excell Seltzer Current Outpatient Medications on File Prior to Visit  Medication Sig Dispense Refill  . acetaminophen (TYLENOL) 650 MG CR tablet Take 650 mg by mouth every 8 (eight) hours as needed for pain.    Marland Kitchen amLODipine (NORVASC) 10 MG tablet Take 1 tablet (10 mg total) by mouth daily. 90 tablet 1  . aspirin EC 81 MG tablet Take 81 mg by mouth daily.    . enalapril (VASOTEC) 20 MG tablet Take 1 tablet (20 mg total) by mouth daily. 90 tablet 1  . ezetimibe (ZETIA) 10 MG tablet Take 1 tablet (10 mg total) by mouth daily. 90 tablet 1  . furosemide (LASIX) 40 MG tablet Take 1 tablet (40 mg total) by mouth daily. 90 tablet 1  . meclizine (ANTIVERT) 25 MG tablet Take 25 mg by mouth 3 (three) times daily as needed for dizziness.    . methylPREDNISolone (MEDROL DOSEPAK) 4 MG TBPK tablet 6 tabs daily day 1, 5 tabs daily day 2, 4 tabs daily day 3, 3 tabs daily day 4, 2 tabs daily day 5, 1 tab daily day 6    . omeprazole (PRILOSEC) 20 MG capsule Take 2 capsules (40 mg total) by mouth daily. 180 capsule 2  . potassium chloride (KLOR-CON) 10 MEQ tablet Take 10 mEq by mouth daily.    . pregabalin (LYRICA) 75 MG capsule  Take 1 capsule (75 mg total) by mouth 2 (two) times daily. 60 capsule 3  . primidone (MYSOLINE) 50 MG tablet Take 1 tablet twice a day 60 tablet 6  . rosuvastatin (CRESTOR) 20 MG tablet Take 1 tablet (20 mg total) by mouth at bedtime. 90 tablet 1  . sertraline (ZOLOFT) 50 MG tablet Take 1 tablet (50 mg total) by mouth in the morning. 90 tablet 1  . vitamin B-12 (CYANOCOBALAMIN) 500 MCG tablet Take 500 mcg by mouth daily.     No current facility-administered medications on file prior to visit.   Past Medical History:  Diagnosis Date  . Aortic stenosis 05/02/2017  . Arthritis   . Bilateral foot-drop 01/18/2017  . Cellulitis of left hand 07/07/2019  . Coronary artery disease involving native coronary artery of native heart without angina pectoris 11/01/2015   Overview:  Bypass surgery in 1996 Overview:  Overview:  Bypass surgery in 1996  Last Assessment & Plan:  Continue his current regimen.  Follow up as noted.  . CVA (cerebral vascular accident) (HCC) 04/03/2016   Last Assessment & Plan:  The patient underwent extensive CVA workup.  MRI is negative.  He does have aortic stenosis by echocardiogram.  Upon review of Care everywhere he is followed by Dr. Bing Matter, cardiologist for this.  Carotid ultrasound negative for hemodynamically significant stenosis.  Lipid  panel within normal limits.  He has not had any further episodes of dizziness or nausea vomiting.   . Dementia (HCC)   . Depression   . Dizziness 04/19/2016  . DNR (do not resuscitate) 04/03/2016   Overview:  I had an extensive discussion with the patient on 04/03/2016 regarding his end of life wishes.  He and his daughter agree that do not resuscitate is in keeping with his beliefs.  . Dyslipidemia 11/01/2015   Last Assessment & Plan:  Formatting of this note might be different from the original. Cholesterol 136   Triglycerides  83  HDL  50.1  LDL Calculated  69  VLDL Cholesterol Cal  17   Continue statin.  Marland Kitchen Dyspnea on exertion 06/30/2018  .  Essential hypertension 11/01/2015   Last Assessment & Plan:  Resume home medications.  Follow-up as noted above.  Marland Kitchen Herpes zoster 05/19/2019  . Hypertension   . Neuropathy   . Other chest pain   . Recurrent falls 06/25/2019  . Severe aortic stenosis   . Sleep apnea   . 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   Past Surgical History:  Procedure Laterality Date  . CARDIAC SURGERY      Family History  Problem Relation Age of Onset  . Cancer Mother   . Diabetes Mother   . Kidney disease Mother   . Stroke Mother   . Sleep disorder Mother   . Arthritis Father   . Migraines Father   . Hypertension Father   . Sleep disorder Father   . Arthritis Brother   . Cancer Brother    Social History   Socioeconomic History  . Marital status: Widowed    Spouse name: Not on file  . Number of children: 2  . Years of education: Not on file  . Highest education level: Not on file  Occupational History  . Occupation: retired  Tobacco Use  . Smoking status: Former Smoker    Types: Cigarettes, Cigars    Quit date: 05/02/1962    Years since quitting: 57.4  . Smokeless tobacco: Never Used  Substance and Sexual Activity  . Alcohol use: No  . Drug use: No  . Sexual activity: Not Currently  Other Topics Concern  . Not on file  Social History Narrative  . Not on file   Social Determinants of Health   Financial Resource Strain:   . Difficulty of Paying Living Expenses:   Food Insecurity: No Food Insecurity  . Worried About Programme researcher, broadcasting/film/video in the Last Year: Never true  . Ran Out of Food in the Last Year: Never true  Transportation Needs: No Transportation Needs  . Lack of Transportation (Medical): No  . Lack of Transportation (Non-Medical): No  Physical Activity:   . Days of Exercise per Week:   . Minutes of Exercise per Session:   Stress:   . Feeling  of Stress :   Social Connections:   . Frequency of Communication with Friends and Family:   . Frequency of Social Gatherings with Friends and Family:   . Attends Religious Services:   . Active Member of Clubs or Organizations:   . Attends Banker Meetings:   Marland Kitchen Marital Status:     Review of Systems  Constitutional: Negative.   HENT: Negative.   Eyes: Negative.   Respiratory: Negative.   Cardiovascular:  Negative.   Gastrointestinal: Negative.   Endocrine: Negative.   Genitourinary: Negative.   Musculoskeletal: Negative.   Skin: Negative.   Neurological: Negative.   Hematological: Negative.   Psychiatric/Behavioral: Negative.      Objective:  BP 118/64   Pulse (!) 53   Temp 97.6 F (36.4 C)   Resp 16   Ht 5\' 6"  (1.676 m)   Wt 170 lb 12.8 oz (77.5 kg)   SpO2 91%   BMI 27.57 kg/m   BP/Weight 09/24/2019 09/14/2019 07/10/2019  Systolic BP 118 112 124  Diastolic BP 64 70 70  Wt. (Lbs) 170.8 170 172.8  BMI 27.57 29.18 29.66    Physical Exam Vitals reviewed.  Constitutional:      Appearance: Normal appearance.  HENT:     Head: Normocephalic and atraumatic.     Right Ear: Tympanic membrane normal.     Left Ear: Tympanic membrane normal.     Nose: Nose normal.     Mouth/Throat:     Mouth: Mucous membranes are dry.  Eyes:     Extraocular Movements: Extraocular movements intact.     Conjunctiva/sclera: Conjunctivae normal.     Pupils: Pupils are equal, round, and reactive to light.  Cardiovascular:     Rate and Rhythm: Normal rate and regular rhythm.     Heart sounds: Murmur heard.   Pulmonary:     Effort: Pulmonary effort is normal.     Breath sounds: Normal breath sounds.  Abdominal:     General: Abdomen is flat. Bowel sounds are normal.     Palpations: Abdomen is soft.  Musculoskeletal:        General: Normal range of motion.     Cervical back: Normal range of motion and neck supple.     Comments: Bilateral foot drop with prosthetics  Skin:     General: Skin is warm and dry.     Capillary Refill: Capillary refill takes less than 2 seconds.  Neurological:     General: No focal deficit present.     Mental Status: He is alert. Mental status is at baseline.  Psychiatric:        Mood and Affect: Mood normal.        Behavior: Behavior normal.        Thought Content: Thought content normal.       Lab Results  Component Value Date   WBC 7.2 07/17/2019   HGB 13.5 07/17/2019   HCT 39.4 07/17/2019   PLT 163 07/17/2019   GLUCOSE 141 (H) 06/17/2019   ALT 12 06/17/2019   AST 17 06/17/2019   NA 137 06/17/2019   K 3.2 (L) 06/17/2019   CL 104 06/17/2019   CREATININE 1.09 06/17/2019   BUN 12 06/17/2019   CO2 23 06/17/2019      Assessment & Plan:   1. Dyslipidemia - Lipid panel AN INDIVIDUAL CARE PLAN for hyperlipidemia/ cholesterol was established and reinforced today.  The patient's status was assessed using clinical findings on exam, lab and other diagnostic tests. The patient's disease status was assessed based on evidence-based guidelines and found to be well controlled. MEDICATIONS were reviewed. SELF MANAGEMENT GOALS have been discussed and patient's success at attaining the goal of low cholesterol was assessed. RECOMMENDATION given include regular exercise 3 days a week and low cholesterol/low fat diet. CLINICAL SUMMARY including written plan to identify barriers unique to the patient due to social or economic  reasons was discussed.  2. Essential hypertension - CBC with Differential/Platelet -  Comprehensive metabolic panel An individual hypertension care plan was established and reinforced today.  The patient's status was assessed using clinical findings on exam and labs or diagnostic tests. The patient's success at meeting treatment goals on disease specific evidence-based guidelines and found to be well controlled. SELF MANAGEMENT: The patient and I together assessed ways to personally work towards obtaining the  recommended goals. RECOMMENDATIONS: avoid decongestants found in common cold remedies, decrease consumption of alcohol, perform routine monitoring of BP with home BP cuff, exercise, reduction of dietary salt, take medicines as prescribed, try not to miss doses and quit smoking.  Regular exercise and maintaining a healthy weight is needed.  Stress reduction may help. A CLINICAL SUMMARY including written plan identify barriers to care unique to individual due to social or financial issues.  We attempt to mutually creat solutions for individual and family understanding. 3. Dyspnea on exertion This is a chronic problem and being followed by cardiology  4. Nonrheumatic aortic valve stenosis He is advancing to a critical aortic stenosis and they are considering TAVR.  5. Spinal stenosis of lumbar region with neurogenic claudication - meloxicam (MOBIC) 15 MG tablet; Take 1 tablet (15 mg total) by mouth daily.  Dispense: 90 tablet; Refill: 2 AN INDIVIDUAL CARE PLAN for back pain was established and reinforced today.  The patient's status was assessed using clinical findings on exam, labs, and other diagnostic testing. Patient's success at meeting treatment goals based on disease specific evidence-bassed guidelines and found to be in good control. RECOMMENDATIONS include maintaining present medicines and treatment.  6. B12 deficiency - Vitamin B12 Continue on B12 supplements, check level  7. Atherosclerosis of aorta (HCC) Found on recent CT abdomen, no aneurysm  8. BMI 27.0-27.9,adult An individualize plan was formulated for obesity using patient history and physical exam to encourage weight loss.  An evidence based program was formulated.  Patient is to cut portion size with meals and to plan physical exercise 3 days a week at least 20 minutes.  Weight watchers and other programs are helpful.  Planned amount of weight loss 5 lbs.    Meds ordered this encounter  Medications  . meloxicam (MOBIC) 15  MG tablet    Sig: Take 1 tablet (15 mg total) by mouth daily.    Dispense:  90 tablet    Refill:  2    Orders Placed This Encounter  Procedures  . CBC with Differential/Platelet  . Comprehensive metabolic panel  . Lipid panel  . Vitamin B12     Follow-up: Return in about 4 months (around 01/25/2020), or mcr PE kim, for fasting.  An After Visit Summary was printed and given to the patient.  Brent Bulla [facility] [phone]

## 2019-09-26 NOTE — Progress Notes (Signed)
Anemia,  low platelets but no dangerous- he had this one year ago and recovered, Kidney and liver tests normal, Cholesterol normal, B12 level normal lp

## 2019-09-29 LAB — COMPREHENSIVE METABOLIC PANEL
ALT: 13 IU/L (ref 0–44)
AST: 19 IU/L (ref 0–40)
Albumin/Globulin Ratio: 2 (ref 1.2–2.2)
Albumin: 4 g/dL (ref 3.6–4.6)
Alkaline Phosphatase: 74 IU/L (ref 48–121)
BUN/Creatinine Ratio: 14 (ref 10–24)
BUN: 13 mg/dL (ref 8–27)
Bilirubin Total: 0.6 mg/dL (ref 0.0–1.2)
CO2: 24 mmol/L (ref 20–29)
Calcium: 9.1 mg/dL (ref 8.6–10.2)
Chloride: 105 mmol/L (ref 96–106)
Creatinine, Ser: 0.95 mg/dL (ref 0.76–1.27)
GFR calc Af Amer: 83 mL/min/{1.73_m2} (ref >59)
GFR calc non Af Amer: 72 mL/min/{1.73_m2} (ref >59)
Globulin, Total: 2 g/dL (ref 1.5–4.5)
Glucose: 85 mg/dL (ref 65–99)
Potassium: 4.1 mmol/L (ref 3.5–5.2)
Sodium: 141 mmol/L (ref 134–144)
Total Protein: 6 g/dL (ref 6.0–8.5)

## 2019-09-29 LAB — CBC WITH DIFFERENTIAL/PLATELET
Basophils Absolute: 0.1 10*3/uL (ref 0.0–0.2)
Basos: 1 %
EOS (ABSOLUTE): 0.4 10*3/uL (ref 0.0–0.4)
Eos: 6 %
Hematocrit: 36.6 % — ABNORMAL LOW (ref 37.5–51.0)
Hemoglobin: 12.1 g/dL — ABNORMAL LOW (ref 13.0–17.7)
Immature Grans (Abs): 0 10*3/uL (ref 0.0–0.1)
Immature Granulocytes: 0 %
Lymphocytes Absolute: 2.2 10*3/uL (ref 0.7–3.1)
Lymphs: 35 %
MCH: 30.3 pg (ref 26.6–33.0)
MCHC: 33.1 g/dL (ref 31.5–35.7)
MCV: 92 fL (ref 79–97)
Monocytes Absolute: 0.4 10*3/uL (ref 0.1–0.9)
Monocytes: 6 %
Neutrophils Absolute: 3.3 10*3/uL (ref 1.4–7.0)
Neutrophils: 52 %
Platelets: 90 10*3/uL — CL (ref 150–450)
RBC: 3.99 x10E6/uL — ABNORMAL LOW (ref 4.14–5.80)
RDW: 14.4 % (ref 11.6–15.4)
WBC: 6.4 10*3/uL (ref 3.4–10.8)

## 2019-09-29 LAB — LIPID PANEL
Chol/HDL Ratio: 3.6 ratio (ref 0.0–5.0)
Cholesterol, Total: 127 mg/dL (ref 100–199)
HDL: 35 mg/dL — ABNORMAL LOW (ref >39)
LDL Chol Calc (NIH): 67 mg/dL (ref 0–99)
Triglycerides: 140 mg/dL (ref 0–149)
VLDL Cholesterol Cal: 25 mg/dL (ref 5–40)

## 2019-09-29 LAB — VITAMIN B12: Vitamin B-12: 1149 pg/mL (ref 232–1245)

## 2019-09-29 LAB — CARDIOVASCULAR RISK ASSESSMENT

## 2019-10-01 ENCOUNTER — Ambulatory Visit: Payer: Self-pay

## 2019-10-01 NOTE — Progress Notes (Signed)
[facility] Consult Note  Chief Complaint  Patient presents with  . Follow-up    Severe AS, dyspnea   History of Present Illness: 86 yo male with history of CAD s/p CABG in 1996, PAD, remote CVA, sleep apnea, HTN, HLD and severe aortic stenosis who is here today for follow up in the valve clinic. He is referred by Dr. Agustin Cree for further discussion regarding his severe aortic stenosis and possible TAVR. He underwent bypass surgery in 1996. No cardiac cath since then. He has PAD and last non-invasive imaging in 2021 showed likely severe disease in the left superficial femoral artery. His aortic stenosis has been followed by Dr. Agustin Cree. He was admitted to [facility] in March 2021 with chest pain felt to be due to shingles. I met him then. He did not wish to consider TAVR at that time. Plans at that time were for repeat echo in May and follow up with primary cardiology. Most recent echo 08/03/19 with LVEF=60-65%, moderate LVH. There is mild mitral regurgitation. The aortic valve is thickened and calcified with limited leaflet excursion. Mean gradient 31 mmHg, peak gradient 53 mmHg, AVA 0.72 cm2 and dimensionless index 0.24.   He tells me today that he has been having progressive dyspnea on exertion. No chest pain.  He lives in his own home with his son. He lives in [place], [place]. 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 He does not have dentures. He sees the dentist regularly and thinks he has a filling that needs to be replaced.   Primary Care Physician: Lillard Anes, MD Primary Cardiologist: Agustin Cree Referring Cardiologist: Agustin Cree  Past Medical History:  Diagnosis Date  . Aortic stenosis 05/02/2017  . Arthritis   . Bilateral foot-drop 01/18/2017  . Cellulitis of left hand 07/07/2019  . Coronary artery disease involving native coronary artery of native heart without angina pectoris 11/01/2015   Overview:  Bypass surgery in 1996 Overview:  Overview:  Bypass surgery in [place]:  Continue his current regimen.  Follow up as noted.  . CVA (cerebral vascular accident) ([place]) 04/03/2016   Last Assessment & Plan:  The patient underwent extensive CVA workup.  MRI is negative.  He does have aortic stenosis by echocardiogram.  Upon review of Care everywhere he is followed by Dr. Agustin Cree, cardiologist for this.  Carotid ultrasound negative for hemodynamically significant stenosis.  Lipid panel within normal limits.  He has not had any further episodes of dizziness or nausea vomiting.   . Dementia ([place])   . Depression   . Dizziness 04/19/2016  . DNR (do not resuscitate) 04/03/2016   Overview:  I had an extensive discussion with the patient on 04/03/2016 regarding his end of life wishes.  He and his daughter agree that do not resuscitate is in keeping with his beliefs.  . Dyslipidemia 11/01/2015   Last Assessment & Plan:  Formatting of this note might be different from the original. Cholesterol 136   Triglycerides  83  HDL  50.1  LDL Calculated  69  VLDL Cholesterol Cal  17   Continue statin.  Marland Kitchen Dyspnea on exertion 06/30/2018  . Essential hypertension 11/01/2015   Last Assessment & Plan:  Resume home medications.  Follow-up as noted above.  Marland Kitchen Herpes zoster 05/19/2019  . Hypertension   . Neuropathy   . Other chest pain   . Recurrent falls 06/25/2019  . Severe aortic stenosis   . Sleep apnea   . Spinal  stenosis of lumbar region with neurogenic claudication 01/18/2017  . 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    Past Surgical History:  Procedure Laterality Date  . CARDIAC SURGERY      Current Outpatient Medications  Medication Sig Dispense Refill  . acetaminophen (TYLENOL) 650 MG CR tablet Take 650 mg by mouth every 8 (eight) hours as needed for pain.    Marland Kitchen amLODipine (NORVASC) 10 MG tablet Take 1 tablet (10 mg total) by mouth daily. 90 tablet 1  . aspirin EC 81 MG tablet Take 81 mg by mouth daily.      . enalapril (VASOTEC) 20 MG tablet Take 1 tablet (20 mg total) by mouth daily. 90 tablet 1  . ezetimibe (ZETIA) 10 MG tablet Take 1 tablet (10 mg total) by mouth daily. 90 tablet 1  . furosemide (LASIX) 40 MG tablet Take 1 tablet (40 mg total) by mouth daily. 90 tablet 1  . meclizine (ANTIVERT) 25 MG tablet Take 25 mg by mouth 3 (three) times daily as needed for dizziness.    . meloxicam (MOBIC) 15 MG tablet Take 1 tablet (15 mg total) by mouth daily. 90 tablet 2  . omeprazole (PRILOSEC) 20 MG capsule Take 2 capsules (40 mg total) by mouth daily. 180 capsule 2  . potassium chloride (KLOR-CON) 10 MEQ tablet Take 10 mEq by mouth daily.    . pregabalin (LYRICA) 75 MG capsule Take 1 capsule (75 mg total) by mouth 2 (two) times daily. 60 capsule 3  . primidone (MYSOLINE) 50 MG tablet Take 1 tablet twice a day 60 tablet 6  . rosuvastatin (CRESTOR) 20 MG tablet Take 1 tablet (20 mg total) by mouth at bedtime. 90 tablet 1  . sertraline (ZOLOFT) 50 MG tablet Take 1 tablet (50 mg total) by mouth in the morning. 90 tablet 1  . vitamin B-12 (CYANOCOBALAMIN) 500 MCG tablet Take 500 mcg by mouth daily.     No current facility-administered medications for this visit.    Allergies  Allergen Reactions  . Poison Oak Extract Rash    Social History   Socioeconomic History  . Marital status: Widowed    Spouse name: Not on file  . Number of children: 2  . Years of education: Not on file  . Highest education level: Not on file  Occupational History  . Occupation: retired  Tobacco Use  . Smoking status: Former Smoker    Types: Cigarettes, Cigars    Quit date: 05/02/1962    Years since quitting: 57.4  . Smokeless tobacco: Never Used  Substance and Sexual Activity  . Alcohol use: No  . Drug use: No  . Sexual activity: Not Currently  Other Topics Concern  . Not on file  Social History Narrative  . Not on file   Social Determinants of Health   Financial Resource Strain:   . Difficulty of Paying  Living Expenses:   Food Insecurity: No Food Insecurity  . Worried About Charity fundraiser in the Last Year: Never true  . Ran Out of Food in the Last Year: Never true  Transportation Needs: No Transportation Needs  . Lack of Transportation (Medical): No  . Lack of Transportation (Non-Medical): No  Physical Activity:   . Days of Exercise per Week:   . Minutes of Exercise per Session:   Stress:   . Feeling of Stress :   Social Connections:   . Frequency of Communication with  Friends and Family:   . Frequency of Social Gatherings with Friends and Family:   . Attends Religious Services:   . Active Member of Clubs or Organizations:   . Attends Archivist Meetings:   Marland Kitchen Marital Status:   Intimate Partner Violence:   . Fear of Current or Ex-Partner:   . Emotionally Abused:   Marland Kitchen Physically Abused:   . Sexually Abused:     Family History  Problem Relation Age of Onset  . Cancer Mother   . Diabetes Mother   . Kidney disease Mother   . Stroke Mother   . Sleep disorder Mother   . Arthritis Father   . Migraines Father   . Hypertension Father   . Sleep disorder Father   . Arthritis Brother   . Cancer Brother     Review of Systems:  As stated in the HPI and otherwise negative.   BP 110/60 (BP Location: Left Arm, Patient Position: Sitting, Cuff Size: Normal)   Pulse 88   Ht _0  (1.676 m)   Wt 167 lb 12.8 oz (76.1 kg)   SpO2 92%   BMI 27.08 kg/m   Physical Examination: General: Well developed, well nourished, NAD  HEENT: OP clear, mucus membranes moist  SKIN: warm, dry. No rashes. Neuro: No focal deficits  Musculoskeletal: Muscle strength 5/5 all ext  Psychiatric: Mood and affect normal  Neck: No JVD, no carotid bruits, no thyromegaly, no lymphadenopathy.  Lungs:Clear bilaterally, no wheezes, rhonci, crackles Cardiovascular: Regular rate and rhythm. Loud, harsh, late peaking systolic murmur.  Abdomen:Soft. Bowel sounds present. Non-tender.  Extremities: No  lower extremity edema. Pulses are 2 + in the bilateral DP/PT.  EKG:  EKG is ordered today and demonstrates Sinus brady, rate 45 bpm with 1st degree AV block, RBBB The ekg ordered March 2021 is reviewed today and demonstrates sinus with 1st degree AV block, RBBB  Echo 08/03/19: 1. Left ventricular ejection fraction, by estimation, is 60 to 65%. The  left ventricle has normal function. The left ventricle has no regional  wall motion abnormalities. There is moderate concentric left ventricular  hypertrophy. Left ventricular  diastolic parameters are consistent with Grade II diastolic dysfunction  (pseudonormalization).  2. Right ventricular systolic function is mildly reduced. The right  ventricular size is normal. There is normal pulmonary artery systolic  pressure.  3. Left atrial size was mildly dilated.  4. The mitral valve is normal in structure. Mild mitral valve  regurgitation. No evidence of mitral stenosis.  5. Tricuspid valve regurgitation is mild to moderate.  6. The aortic valve is mildy thickened with moderate calcification.  Aortic valve regurgitation is not visualized. Moderate to severe aortic  valve stenosis. Aortic valve area, by VTI measures 0.82 cm. Aortic valve  mean gradient measures 31.0 mmHg.  Aortic valve Vmax measures 3.64 m/s.  7. The inferior vena cava is normal in size with greater than 50%  respiratory variability, suggesting right atrial pressure of 3 mmHg.   Comparison(s): No significant change from prior study done in 04/03/2019.   FINDINGS  Left Ventricle: Left ventricular ejection fraction, by estimation, is 60  to 65%. The left ventricle has normal function. The left ventricle has no  regional wall motion abnormalities. The left ventricular internal cavity  size was normal in size. There is  moderate concentric left ventricular hypertrophy. Left ventricular  diastolic parameters are consistent with Grade II diastolic dysfunction    (pseudonormalization).   Right Ventricle: The right ventricular size is normal.  No increase in  right ventricular wall thickness. Right ventricular systolic function is  mildly reduced. There is normal pulmonary artery systolic pressure. The  tricuspid regurgitant velocity is 2.52  m/s, and with an assumed right atrial pressure of 3 mmHg, the estimated  right ventricular systolic pressure is 21.1 mmHg.   Left Atrium: Left atrial size was mildly dilated.   Right Atrium: Right atrial size was normal in size.   Pericardium: There is no evidence of pericardial effusion.   Mitral Valve: The mitral valve is normal in structure. Normal mobility of  the mitral valve leaflets. Mild mitral valve regurgitation. No evidence of  mitral valve stenosis.   Tricuspid Valve: The tricuspid valve is normal in structure. Tricuspid  valve regurgitation is mild to moderate. No evidence of tricuspid  stenosis.   Aortic Valve: The aortic valve is abnormal. . There is moderate thickening  and moderate calcification of the aortic valve. Aortic valve regurgitation  is not visualized. Moderate to severe aortic stenosis is present. Mild  aortic valve annular  calcification. There is moderate thickening of the aortic valve. There is  moderate calcification of the aortic valve. Aortic valve mean gradient  measures 31.0 mmHg. Aortic valve peak gradient measures 53.0 mmHg. Aortic  valve area, by VTI measures 0.82  cm.   Pulmonic Valve: The pulmonic valve was not well visualized. Pulmonic valve  regurgitation is not visualized. No evidence of pulmonic stenosis.   Aorta: The aortic root is normal in size and structure.   Venous: The inferior vena cava is normal in size with greater than 50%  respiratory variability, suggesting right atrial pressure of 3 mmHg.   IAS/Shunts: No atrial level shunt detected by color flow Doppler.     LEFT VENTRICLE  PLAX 2D  LVIDd:     4.70 cm Diastology  LVIDs:      2.70 cm LV e' lateral:  5.77 cm/s  LV PW:     1.40 cm LV E/e' lateral: 19.2  LV IVS:    1.40 cm LV e' medial:  5.06 cm/s  LVOT diam:   2.10 cm LV E/e' medial: 21.9  LV SV:     75  LV SV Index:  41  LVOT Area:   3.46 cm     RIGHT VENTRICLE      IVC  RV S prime:   7.29 cm/s IVC diam: 1.20 cm  TAPSE (M-mode): 1.5 cm   LEFT ATRIUM       Index    RIGHT ATRIUM      Index  LA Vol (A2C):  71.0 ml 38.62 ml/m RA Area:   17.60 cm  LA Vol (A4C):  76.9 ml 41.83 ml/m RA Volume:  48.40 ml 26.33 ml/m  LA Biplane Vol: 74.5 ml 40.52 ml/m  AORTIC VALVE  AV Area (Vmax):  0.78 cm  AV Area (Vmean):  0.72 cm  AV Area (VTI):   0.82 cm  AV Vmax:      364.00 cm/s  AV Vmean:     257.000 cm/s  AV VTI:      0.913 m  AV Peak Grad:   53.0 mmHg  AV Mean Grad:   31.0 mmHg  LVOT Vmax:     82.00 cm/s  LVOT Vmean:    53.300 cm/s  LVOT VTI:     0.217 m  LVOT/AV VTI ratio: 0.24    AORTA  Ao Root diam: 3.40 cm  Ao Asc diam: 3.80 cm   MITRAL VALVE  TRICUSPID VALVE  MV Area (PHT): 4.06 cm   TR Peak grad:  25.4 mmHg  MV Decel Time: 187 msec   TR Vmax:    252.00 cm/s  MV E velocity: 111.00 cm/s  MV A velocity: 65.00 cm/s  SHUNTS  MV E/A ratio: 1.71     Systemic VTI: 0.22 m               Systemic Diam: 2.10 cm   Recent Labs: 06/17/2019: B Natriuretic Peptide 154.2 09/24/2019: ALT 13; BUN 13; Creatinine, Ser 0.95; Hemoglobin 12.1; Platelets 90; Potassium 4.1; Sodium 141    Wt Readings from Last 3 Encounters:  10/02/19 167 lb 12.8 oz (76.1 kg)  09/24/19 170 lb 12.8 oz (77.5 kg)  09/14/19 170 lb (77.1 kg)     Other studies Reviewed: Additional studies/ records that were reviewed today include: echo images, office notes.  Review of the above records demonstrates: Severe AS   Assessment and Plan:   1. Severe Aortic Valve Stenosis: He has severe, stage D  aortic valve stenosis. I have personally reviewed the echo images. The aortic valve is thickened, calcified with limited leaflet mobility. I think he would benefit from AVR. Given advanced age, he is not a good candidate for conventional AVR by surgical approach. I think he may be a good candidate for TAVR.   STS Risk Score: Risk of Mortality: 4.526% Renal Failure: 2.792% Permanent Stroke: 1.948% Prolonged Ventilation: 12.752% DSW Infection: 0.248% Reoperation: 4.485% Morbidity or Mortality: 17.403% Short Length of Stay: 25.871% Long Length of Stay: 10.500%  I have reviewed the natural history of aortic stenosis with the patient and their family members  who are present today. We have discussed the limitations of medical therapy and the poor prognosis associated with symptomatic aortic stenosis. We have reviewed potential treatment options, including palliative medical therapy, conventional surgical aortic valve replacement, and transcatheter aortic valve replacement. We discussed treatment options in the context of the patient's specific comorbid medical conditions.   He would like to proceed with planning for TAVR. I will arrange a right and left heart catheterization at [facility] 10/07/19 with Dr. Burt Knack since I do not have any cath lab days over the next two weeks. Risks and benefits of the cath procedure and the valve procedure are reviewed with the patient. After the cath, he will have a cardiac CT, CTA of the chest/abdomen and pelvis, carotid artery dopplers, PT assessment and will then be referred to see one of the CT surgeons on our TAVR team. Pre-cath labs today.  I will ask our cath team to try and locate an operative report from his bypass surgery in 1996. This is not available in the electronic medical record.    Current medicines are reviewed at length with the patient today.  The patient does not have concerns regarding medicines.  The following changes have been made:  no  change  Labs/ tests ordered today include:   Orders Placed This Encounter  Procedures  . Basic metabolic panel  . CBC  . EKG 12-Lead     Disposition:   FU with the valve team.    Signed, Lauree Chandler, MD 10/02/2019 8:44 AM    [facility] [address], [place], [place]  [address] Phone: [phone]; Fax: [phone]

## 2019-10-01 NOTE — H&P (View-Only) (Signed)
 [facility] Consult Note  Chief Complaint  Patient presents with  . Follow-up    Severe AS, dyspnea   History of Present Illness: 86 yo male with history of CAD s/p CABG in 1996, PAD, remote CVA, sleep apnea, HTN, HLD and severe aortic stenosis who is here today for follow up in the valve clinic. He is referred by Dr. Krasowski for further discussion regarding his severe aortic stenosis and possible TAVR. He underwent bypass surgery in 1996. No cardiac cath since then. He has PAD and last non-invasive imaging in 2021 showed likely severe disease in the left superficial femoral artery. His aortic stenosis has been followed by Dr. Krasowski. He was admitted to [facility] in March 2021 with chest pain felt to be due to shingles. I met him then. He did not wish to consider TAVR at that time. Plans at that time were for repeat echo in May and follow up with primary cardiology. Most recent echo 08/03/19 with LVEF=60-65%, moderate LVH. There is mild mitral regurgitation. The aortic valve is thickened and calcified with limited leaflet excursion. Mean gradient 31 mmHg, peak gradient 53 mmHg, AVA 0.72 cm2 and dimensionless index 0.24.   He tells me today that he has been having progressive dyspnea on exertion. No chest pain.  He lives in his own home with his son. He lives in [place], [place]. He is retired as the owner of a sawmill. He does not have dentures. He sees the dentist regularly and thinks he has a filling that needs to be replaced.   Primary Care Physician: Perry, Lawrence Edward, MD Primary Cardiologist: Krasowski Referring Cardiologist: Krasowski  Past Medical History:  Diagnosis Date  . Aortic stenosis 05/02/2017  . Arthritis   . Bilateral foot-drop 01/18/2017  . Cellulitis of left hand 07/07/2019  . Coronary artery disease involving native coronary artery of native heart without angina pectoris 11/01/2015   Overview:  Bypass surgery in 1996 Overview:  Overview:  Bypass surgery in 1996   Last Assessment & Plan:  Continue his current regimen.  Follow up as noted.  . CVA (cerebral vascular accident) (HCC) 04/03/2016   Last Assessment & Plan:  The patient underwent extensive CVA workup.  MRI is negative.  He does have aortic stenosis by echocardiogram.  Upon review of Care everywhere he is followed by Dr. Krasowski, cardiologist for this.  Carotid ultrasound negative for hemodynamically significant stenosis.  Lipid panel within normal limits.  He has not had any further episodes of dizziness or nausea vomiting.   . Dementia (HCC)   . Depression   . Dizziness 04/19/2016  . DNR (do not resuscitate) 04/03/2016   Overview:  I had an extensive discussion with the patient on 04/03/2016 regarding his end of life wishes.  He and his daughter agree that do not resuscitate is in keeping with his beliefs.  . Dyslipidemia 11/01/2015   Last Assessment & Plan:  Formatting of this note might be different from the original. Cholesterol 136   Triglycerides  83  HDL  50.1  LDL Calculated  69  VLDL Cholesterol Cal  17   Continue statin.  . Dyspnea on exertion 06/30/2018  . Essential hypertension 11/01/2015   Last Assessment & Plan:  Resume home medications.  Follow-up as noted above.  . Herpes zoster 05/19/2019  . Hypertension   . Neuropathy   . Other chest pain   . Recurrent falls 06/25/2019  . Severe aortic stenosis   . Sleep apnea   . Spinal   stenosis of lumbar region with neurogenic claudication 01/18/2017  . 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    Past Surgical History:  Procedure Laterality Date  . CARDIAC SURGERY      Current Outpatient Medications  Medication Sig Dispense Refill  . acetaminophen (TYLENOL) 650 MG CR tablet Take 650 mg by mouth every 8 (eight) hours as needed for pain.    . amLODipine (NORVASC) 10 MG tablet Take 1 tablet (10 mg total) by mouth daily. 90 tablet 1  . aspirin EC 81 MG tablet Take 81 mg by mouth daily.      . enalapril (VASOTEC) 20 MG tablet Take 1 tablet (20 mg total) by mouth daily. 90 tablet 1  . ezetimibe (ZETIA) 10 MG tablet Take 1 tablet (10 mg total) by mouth daily. 90 tablet 1  . furosemide (LASIX) 40 MG tablet Take 1 tablet (40 mg total) by mouth daily. 90 tablet 1  . meclizine (ANTIVERT) 25 MG tablet Take 25 mg by mouth 3 (three) times daily as needed for dizziness.    . meloxicam (MOBIC) 15 MG tablet Take 1 tablet (15 mg total) by mouth daily. 90 tablet 2  . omeprazole (PRILOSEC) 20 MG capsule Take 2 capsules (40 mg total) by mouth daily. 180 capsule 2  . potassium chloride (KLOR-CON) 10 MEQ tablet Take 10 mEq by mouth daily.    . pregabalin (LYRICA) 75 MG capsule Take 1 capsule (75 mg total) by mouth 2 (two) times daily. 60 capsule 3  . primidone (MYSOLINE) 50 MG tablet Take 1 tablet twice a day 60 tablet 6  . rosuvastatin (CRESTOR) 20 MG tablet Take 1 tablet (20 mg total) by mouth at bedtime. 90 tablet 1  . sertraline (ZOLOFT) 50 MG tablet Take 1 tablet (50 mg total) by mouth in the morning. 90 tablet 1  . vitamin B-12 (CYANOCOBALAMIN) 500 MCG tablet Take 500 mcg by mouth daily.     No current facility-administered medications for this visit.    Allergies  Allergen Reactions  . Poison Oak Extract Rash    Social History   Socioeconomic History  . Marital status: Widowed    Spouse name: Not on file  . Number of children: 2  . Years of education: Not on file  . Highest education level: Not on file  Occupational History  . Occupation: retired  Tobacco Use  . Smoking status: Former Smoker    Types: Cigarettes, Cigars    Quit date: 05/02/1962    Years since quitting: 57.4  . Smokeless tobacco: Never Used  Substance and Sexual Activity  . Alcohol use: No  . Drug use: No  . Sexual activity: Not Currently  Other Topics Concern  . Not on file  Social History Narrative  . Not on file   Social Determinants of Health   Financial Resource Strain:   . Difficulty of Paying  Living Expenses:   Food Insecurity: No Food Insecurity  . Worried About Running Out of Food in the Last Year: Never true  . Ran Out of Food in the Last Year: Never true  Transportation Needs: No Transportation Needs  . Lack of Transportation (Medical): No  . Lack of Transportation (Non-Medical): No  Physical Activity:   . Days of Exercise per Week:   . Minutes of Exercise per Session:   Stress:   . Feeling of Stress :   Social Connections:   . Frequency of Communication with   Friends and Family:   . Frequency of Social Gatherings with Friends and Family:   . Attends Religious Services:   . Active Member of Clubs or Organizations:   . Attends Club or Organization Meetings:   . Marital Status:   Intimate Partner Violence:   . Fear of Current or Ex-Partner:   . Emotionally Abused:   . Physically Abused:   . Sexually Abused:     Family History  Problem Relation Age of Onset  . Cancer Mother   . Diabetes Mother   . Kidney disease Mother   . Stroke Mother   . Sleep disorder Mother   . Arthritis Father   . Migraines Father   . Hypertension Father   . Sleep disorder Father   . Arthritis Brother   . Cancer Brother     Review of Systems:  As stated in the HPI and otherwise negative.   BP 110/60 (BP Location: Left Arm, Patient Position: Sitting, Cuff Size: Normal)   Pulse 88   Ht 5' 6" (1.676 m)   Wt 167 lb 12.8 oz (76.1 kg)   SpO2 92%   BMI 27.08 kg/m   Physical Examination: General: Well developed, well nourished, NAD  HEENT: OP clear, mucus membranes moist  SKIN: warm, dry. No rashes. Neuro: No focal deficits  Musculoskeletal: Muscle strength 5/5 all ext  Psychiatric: Mood and affect normal  Neck: No JVD, no carotid bruits, no thyromegaly, no lymphadenopathy.  Lungs:Clear bilaterally, no wheezes, rhonci, crackles Cardiovascular: Regular rate and rhythm. Loud, harsh, late peaking systolic murmur.  Abdomen:Soft. Bowel sounds present. Non-tender.  Extremities: No  lower extremity edema. Pulses are 2 + in the bilateral DP/PT.  EKG:  EKG is ordered today and demonstrates Sinus brady, rate 45 bpm with 1st degree AV block, RBBB The ekg ordered March 2021 is reviewed today and demonstrates sinus with 1st degree AV block, RBBB  Echo 08/03/19: 1. Left ventricular ejection fraction, by estimation, is 60 to 65%. The  left ventricle has normal function. The left ventricle has no regional  wall motion abnormalities. There is moderate concentric left ventricular  hypertrophy. Left ventricular  diastolic parameters are consistent with Grade II diastolic dysfunction  (pseudonormalization).  2. Right ventricular systolic function is mildly reduced. The right  ventricular size is normal. There is normal pulmonary artery systolic  pressure.  3. Left atrial size was mildly dilated.  4. The mitral valve is normal in structure. Mild mitral valve  regurgitation. No evidence of mitral stenosis.  5. Tricuspid valve regurgitation is mild to moderate.  6. The aortic valve is mildy thickened with moderate calcification.  Aortic valve regurgitation is not visualized. Moderate to severe aortic  valve stenosis. Aortic valve area, by VTI measures 0.82 cm. Aortic valve  mean gradient measures 31.0 mmHg.  Aortic valve Vmax measures 3.64 m/s.  7. The inferior vena cava is normal in size with greater than 50%  respiratory variability, suggesting right atrial pressure of 3 mmHg.   Comparison(s): No significant change from prior study done in 04/03/2019.   FINDINGS  Left Ventricle: Left ventricular ejection fraction, by estimation, is 60  to 65%. The left ventricle has normal function. The left ventricle has no  regional wall motion abnormalities. The left ventricular internal cavity  size was normal in size. There is  moderate concentric left ventricular hypertrophy. Left ventricular  diastolic parameters are consistent with Grade II diastolic dysfunction    (pseudonormalization).   Right Ventricle: The right ventricular size is normal.   No increase in  right ventricular wall thickness. Right ventricular systolic function is  mildly reduced. There is normal pulmonary artery systolic pressure. The  tricuspid regurgitant velocity is 2.52  m/s, and with an assumed right atrial pressure of 3 mmHg, the estimated  right ventricular systolic pressure is 28.4 mmHg.   Left Atrium: Left atrial size was mildly dilated.   Right Atrium: Right atrial size was normal in size.   Pericardium: There is no evidence of pericardial effusion.   Mitral Valve: The mitral valve is normal in structure. Normal mobility of  the mitral valve leaflets. Mild mitral valve regurgitation. No evidence of  mitral valve stenosis.   Tricuspid Valve: The tricuspid valve is normal in structure. Tricuspid  valve regurgitation is mild to moderate. No evidence of tricuspid  stenosis.   Aortic Valve: The aortic valve is abnormal. . There is moderate thickening  and moderate calcification of the aortic valve. Aortic valve regurgitation  is not visualized. Moderate to severe aortic stenosis is present. Mild  aortic valve annular  calcification. There is moderate thickening of the aortic valve. There is  moderate calcification of the aortic valve. Aortic valve mean gradient  measures 31.0 mmHg. Aortic valve peak gradient measures 53.0 mmHg. Aortic  valve area, by VTI measures 0.82  cm.   Pulmonic Valve: The pulmonic valve was not well visualized. Pulmonic valve  regurgitation is not visualized. No evidence of pulmonic stenosis.   Aorta: The aortic root is normal in size and structure.   Venous: The inferior vena cava is normal in size with greater than 50%  respiratory variability, suggesting right atrial pressure of 3 mmHg.   IAS/Shunts: No atrial level shunt detected by color flow Doppler.     LEFT VENTRICLE  PLAX 2D  LVIDd:     4.70 cm Diastology  LVIDs:      2.70 cm LV e' lateral:  5.77 cm/s  LV PW:     1.40 cm LV E/e' lateral: 19.2  LV IVS:    1.40 cm LV e' medial:  5.06 cm/s  LVOT diam:   2.10 cm LV E/e' medial: 21.9  LV SV:     75  LV SV Index:  41  LVOT Area:   3.46 cm     RIGHT VENTRICLE      IVC  RV S prime:   7.29 cm/s IVC diam: 1.20 cm  TAPSE (M-mode): 1.5 cm   LEFT ATRIUM       Index    RIGHT ATRIUM      Index  LA Vol (A2C):  71.0 ml 38.62 ml/m RA Area:   17.60 cm  LA Vol (A4C):  76.9 ml 41.83 ml/m RA Volume:  48.40 ml 26.33 ml/m  LA Biplane Vol: 74.5 ml 40.52 ml/m  AORTIC VALVE  AV Area (Vmax):  0.78 cm  AV Area (Vmean):  0.72 cm  AV Area (VTI):   0.82 cm  AV Vmax:      364.00 cm/s  AV Vmean:     257.000 cm/s  AV VTI:      0.913 m  AV Peak Grad:   53.0 mmHg  AV Mean Grad:   31.0 mmHg  LVOT Vmax:     82.00 cm/s  LVOT Vmean:    53.300 cm/s  LVOT VTI:     0.217 m  LVOT/AV VTI ratio: 0.24    AORTA  Ao Root diam: 3.40 cm  Ao Asc diam: 3.80 cm   MITRAL VALVE          TRICUSPID VALVE  MV Area (PHT): 4.06 cm   TR Peak grad:  25.4 mmHg  MV Decel Time: 187 msec   TR Vmax:    252.00 cm/s  MV E velocity: 111.00 cm/s  MV A velocity: 65.00 cm/s  SHUNTS  MV E/A ratio: 1.71     Systemic VTI: 0.22 m               Systemic Diam: 2.10 cm   Recent Labs: 06/17/2019: B Natriuretic Peptide 154.2 09/24/2019: ALT 13; BUN 13; Creatinine, Ser 0.95; Hemoglobin 12.1; Platelets 90; Potassium 4.1; Sodium 141    Wt Readings from Last 3 Encounters:  10/02/19 167 lb 12.8 oz (76.1 kg)  09/24/19 170 lb 12.8 oz (77.5 kg)  09/14/19 170 lb (77.1 kg)     Other studies Reviewed: Additional studies/ records that were reviewed today include: echo images, office notes.  Review of the above records demonstrates: Severe AS   Assessment and Plan:   1. Severe Aortic Valve Stenosis: He has severe, stage D  aortic valve stenosis. I have personally reviewed the echo images. The aortic valve is thickened, calcified with limited leaflet mobility. I think he would benefit from AVR. Given advanced age, he is not a good candidate for conventional AVR by surgical approach. I think he may be a good candidate for TAVR.   STS Risk Score: Risk of Mortality: 4.526% Renal Failure: 2.792% Permanent Stroke: 1.948% Prolonged Ventilation: 12.752% DSW Infection: 0.248% Reoperation: 4.485% Morbidity or Mortality: 17.403% Short Length of Stay: 25.871% Long Length of Stay: 10.500%  I have reviewed the natural history of aortic stenosis with the patient and their family members  who are present today. We have discussed the limitations of medical therapy and the poor prognosis associated with symptomatic aortic stenosis. We have reviewed potential treatment options, including palliative medical therapy, conventional surgical aortic valve replacement, and transcatheter aortic valve replacement. We discussed treatment options in the context of the patient's specific comorbid medical conditions.   He would like to proceed with planning for TAVR. I will arrange a right and left heart catheterization at [facility] 10/07/19 with Dr. Cooper since I do not have any cath lab days over the next two weeks. Risks and benefits of the cath procedure and the valve procedure are reviewed with the patient. After the cath, he will have a cardiac CT, CTA of the chest/abdomen and pelvis, carotid artery dopplers, PT assessment and will then be referred to see one of the CT surgeons on our TAVR team. Pre-cath labs today.  I will ask our cath team to try and locate an operative report from his bypass surgery in 1996. This is not available in the electronic medical record.    Current medicines are reviewed at length with the patient today.  The patient does not have concerns regarding medicines.  The following changes have been made:  no  change  Labs/ tests ordered today include:   Orders Placed This Encounter  Procedures  . Basic metabolic panel  . CBC  . EKG 12-Lead     Disposition:   FU with the valve team.    Signed, Anjelique Makar, MD 10/02/2019 8:44 AM    [facility] [address], , [place]  [address] Phone: [phone]; Fax: [phone]    

## 2019-10-02 ENCOUNTER — Encounter: Payer: Self-pay | Admitting: Cardiovascular Disease

## 2019-10-02 ENCOUNTER — Ambulatory Visit: Payer: Medicare Other | Admitting: Cardiovascular Disease

## 2019-10-02 ENCOUNTER — Other Ambulatory Visit: Payer: Self-pay

## 2019-10-02 VITALS — BP 110/60 | HR 88 | Ht 66.0 in | Wt 167.8 lb

## 2019-10-02 DIAGNOSIS — I350 Nonrheumatic aortic (valve) stenosis: Secondary | ICD-10-CM

## 2019-10-02 NOTE — Patient Instructions (Signed)
Medication Instructions:  Your physician recommends that you continue on your current medications as directed. Please refer to the Current Medication list given to you today.  *If you need a refill on your cardiac medications before your next appointment, please call your pharmacy*   Lab Work: TODAY: CBC, BMET  If you have labs (blood work) drawn today and your tests are completely normal, you will receive your results only by: Marland Kitchen MyChart Message (if you have MyChart) OR . A paper copy in the mail If you have any lab test that is abnormal or we need to change your treatment, we will call you to review the results.   Testing/Procedures: Your physician has requested that you have a cardiac catheterization. Cardiac catheterization is used to diagnose and/or treat various heart conditions. Doctors may recommend this procedure for a number of different reasons. The most common reason is to evaluate chest pain. Chest pain can be a symptom of coronary artery disease (CAD), and cardiac catheterization can show whether plaque is narrowing or blocking your heart's arteries. This procedure is also used to evaluate the valves, as well as measure the blood flow and oxygen levels in different parts of your heart. For further information please visit [email or web address] Please follow instruction sheet, as given.   Follow-Up: Jack Hodges will contact you after your procedure to arrange any necessary follow-up/testing.  Other Instructions    [facility] CARDIOVASCULAR DIVISION [facility] ST OFFICE [address] [place], [address] [place] [address] Dept: [phone] Loc: [phone]  Jack Hodges  10/02/2019  You are scheduled for a Cardiac Catheterization on Wednesday, July 14 with Dr. Tonny Bollman.  1. Please arrive at the [facility] (Main Entrance A) at [facility]: [address] [place], [place] [address] at 8:00 AM (This time is two hours  before your procedure to ensure your preparation). Free valet parking service is available.   Special note: Every effort is made to have your procedure done on time. Please understand that emergencies sometimes delay scheduled procedures.  2. Diet: Do not eat solid foods after midnight.  The patient may have clear liquids until 5am upon the day of the procedure.  3. Labs: TODAY: CBC, BMET  4. Medication instructions in preparation for your procedure:   Contrast Allergy: No   HOLD furosemide (lasix) and potassium the morning of the procedure  On the morning of your procedure, take your Aspirin and any morning medicines NOT listed above.  You may use sips of water.  5. Plan for one night stay--bring personal belongings. 6. Bring a current list of your medications and current insurance cards. 7. You MUST have a responsible person to drive you home. 8. Someone MUST be with you the first 24 hours after you arrive home or your discharge will be delayed. 9. Please wear clothes that are easy to get on and off and wear slip-on shoes.  Thank you for allowing [place] to care for you!   -- [facility]

## 2019-10-03 LAB — BASIC METABOLIC PANEL
BUN/Creatinine Ratio: 14 (ref 10–24)
BUN: 16 mg/dL (ref 8–27)
CO2: 23 mmol/L (ref 20–29)
Calcium: 8.8 mg/dL (ref 8.6–10.2)
Chloride: 103 mmol/L (ref 96–106)
Creatinine, Ser: 1.14 mg/dL (ref 0.76–1.27)
GFR calc Af Amer: 67 mL/min/{1.73_m2} (ref >59)
GFR calc non Af Amer: 58 mL/min/{1.73_m2} — ABNORMAL LOW (ref >59)
Glucose: 89 mg/dL (ref 65–99)
Potassium: 4.2 mmol/L (ref 3.5–5.2)
Sodium: 139 mmol/L (ref 134–144)

## 2019-10-03 LAB — CBC
Hematocrit: 36.5 % — ABNORMAL LOW (ref 37.5–51.0)
Hemoglobin: 11.9 g/dL — ABNORMAL LOW (ref 13.0–17.7)
MCH: 29.6 pg (ref 26.6–33.0)
MCHC: 32.6 g/dL (ref 31.5–35.7)
MCV: 91 fL (ref 79–97)
Platelets: 94 10*3/uL — CL (ref 150–450)
RBC: 4.02 x10E6/uL — ABNORMAL LOW (ref 4.14–5.80)
RDW: 14.5 % (ref 11.6–15.4)
WBC: 9.7 10*3/uL (ref 3.4–10.8)

## 2019-10-06 ENCOUNTER — Telehealth: Payer: Self-pay | Admitting: *Deleted

## 2019-10-06 NOTE — Telephone Encounter (Signed)
Pt contacted pre-catheterization scheduled at [facility] for: Wednesday October 07, 2019 10 AM Verified arrival time and place: [facility] Main Entrance A [facility]) at: 8 AM   No solid food after midnight prior to cath, clear liquids until 5 AM day of procedure.  Hold: Lasix/KCl-AM of procedure   Except hold medications AM meds can be  taken pre-cath with sips of water including: ASA 81 mg   Confirmed patient has responsible adult to drive home post procedure and observe 24 hours after arriving home: yes  You are allowed ONE visitor in the waiting room during your procedure. Both you and your visitor must wear a mask once you enter the hospital.      COVID-19 Pre-Screening Questions:  . In the past 7 to 10 days have you had a new cough, shortness of breath, headache, congestion, fever (100 or greater) unexplained body aches, new sore throat, or sudden loss of taste or sense of smell? Shortness of breath, not new in past 7 to 10 days . In the past 7 to 10 days have you been around anyone with known Covid 19? no    Reviewed procedure/mask/visitor instructions, COVID-19 questions with patient.  Pt is not vaccinated for COVID-19.

## 2019-10-07 ENCOUNTER — Ambulatory Visit: Payer: Medicare Other

## 2019-10-07 ENCOUNTER — Encounter: Payer: Self-pay | Admitting: Physician Assistant

## 2019-10-07 ENCOUNTER — Other Ambulatory Visit: Payer: Self-pay

## 2019-10-07 ENCOUNTER — Encounter (HOSPITAL_COMMUNITY)
Admission: RE | Disposition: A | Discharge status: Home / Self Care | Payer: Self-pay | Source: Home / Self Care | Attending: Cardiovascular Disease

## 2019-10-07 ENCOUNTER — Ambulatory Visit (HOSPITAL_COMMUNITY)
Admission: RE | Admit: 2019-10-07 | Discharge: 2019-10-08 | Disposition: A | Discharge status: Home / Self Care | Payer: Medicare Other | Attending: Cardiovascular Disease | Admitting: Cardiovascular Disease

## 2019-10-07 ENCOUNTER — Other Ambulatory Visit: Payer: Self-pay | Admitting: Physician Assistant

## 2019-10-07 DIAGNOSIS — K5730 Diverticulosis of large intestine without perforation or abscess without bleeding: Secondary | ICD-10-CM | POA: Insufficient documentation

## 2019-10-07 DIAGNOSIS — I2582 Chronic total occlusion of coronary artery: Secondary | ICD-10-CM | POA: Diagnosis not present

## 2019-10-07 DIAGNOSIS — K8020 Calculus of gallbladder without cholecystitis without obstruction: Secondary | ICD-10-CM | POA: Insufficient documentation

## 2019-10-07 DIAGNOSIS — Z955 Presence of coronary angioplasty implant and graft: Secondary | ICD-10-CM | POA: Diagnosis not present

## 2019-10-07 DIAGNOSIS — R0609 Other forms of dyspnea: Secondary | ICD-10-CM | POA: Diagnosis present

## 2019-10-07 DIAGNOSIS — G4730 Sleep apnea, unspecified: Secondary | ICD-10-CM | POA: Insufficient documentation

## 2019-10-07 DIAGNOSIS — I10 Essential (primary) hypertension: Secondary | ICD-10-CM | POA: Diagnosis not present

## 2019-10-07 DIAGNOSIS — F329 Major depressive disorder, single episode, unspecified: Secondary | ICD-10-CM | POA: Insufficient documentation

## 2019-10-07 DIAGNOSIS — Z87891 Personal history of nicotine dependence: Secondary | ICD-10-CM | POA: Diagnosis not present

## 2019-10-07 DIAGNOSIS — I110 Hypertensive heart disease with heart failure: Secondary | ICD-10-CM | POA: Diagnosis present

## 2019-10-07 DIAGNOSIS — I209 Angina pectoris, unspecified: Secondary | ICD-10-CM | POA: Insufficient documentation

## 2019-10-07 DIAGNOSIS — Z8673 Personal history of transient ischemic attack (TIA), and cerebral infarction without residual deficits: Secondary | ICD-10-CM | POA: Diagnosis not present

## 2019-10-07 DIAGNOSIS — E785 Hyperlipidemia, unspecified: Secondary | ICD-10-CM | POA: Diagnosis present

## 2019-10-07 DIAGNOSIS — I739 Peripheral vascular disease, unspecified: Secondary | ICD-10-CM | POA: Diagnosis not present

## 2019-10-07 DIAGNOSIS — I771 Stricture of artery: Secondary | ICD-10-CM | POA: Insufficient documentation

## 2019-10-07 DIAGNOSIS — Z66 Do not resuscitate: Secondary | ICD-10-CM | POA: Diagnosis not present

## 2019-10-07 DIAGNOSIS — F0390 Unspecified dementia without behavioral disturbance: Secondary | ICD-10-CM | POA: Diagnosis not present

## 2019-10-07 DIAGNOSIS — I700 Atherosclerosis of aorta: Secondary | ICD-10-CM | POA: Diagnosis not present

## 2019-10-07 DIAGNOSIS — Z79899 Other long term (current) drug therapy: Secondary | ICD-10-CM | POA: Insufficient documentation

## 2019-10-07 DIAGNOSIS — Z7982 Long term (current) use of aspirin: Secondary | ICD-10-CM | POA: Diagnosis not present

## 2019-10-07 DIAGNOSIS — Z791 Long term (current) use of non-steroidal anti-inflammatories (NSAID): Secondary | ICD-10-CM | POA: Diagnosis not present

## 2019-10-07 DIAGNOSIS — I350 Nonrheumatic aortic (valve) stenosis: Secondary | ICD-10-CM

## 2019-10-07 DIAGNOSIS — Z951 Presence of aortocoronary bypass graft: Secondary | ICD-10-CM | POA: Diagnosis not present

## 2019-10-07 DIAGNOSIS — I2510 Atherosclerotic heart disease of native coronary artery without angina pectoris: Secondary | ICD-10-CM | POA: Diagnosis not present

## 2019-10-07 HISTORY — PX: CORONARY STENT INTERVENTION: CATH118234

## 2019-10-07 HISTORY — PX: RIGHT/LEFT HEART CATH AND CORONARY/GRAFT ANGIOGRAPHY: CATH118267

## 2019-10-07 HISTORY — DX: Angina pectoris, unspecified: I20.9

## 2019-10-07 LAB — POCT I-STAT EG7
Acid-base deficit: 2 mmol/L (ref 0.0–2.0)
Bicarbonate: 24.6 mmol/L (ref 20.0–28.0)
Calcium, Ion: 1.22 mmol/L (ref 1.15–1.40)
HCT: 32 % — ABNORMAL LOW (ref 39.0–52.0)
Hemoglobin: 10.9 g/dL — ABNORMAL LOW (ref 13.0–17.0)
O2 Saturation: 72 %
Potassium: 4.1 mmol/L (ref 3.5–5.1)
Sodium: 146 mmol/L — ABNORMAL HIGH (ref 135–145)
TCO2: 26 mmol/L (ref 22–32)
pCO2, Ven: 47.3 mmHg (ref 44.0–60.0)
pH, Ven: 7.325 (ref 7.250–7.430)
pO2, Ven: 42 mmHg (ref 32.0–45.0)

## 2019-10-07 LAB — POCT I-STAT 7, (LYTES, BLD GAS, ICA,H+H)
Acid-base deficit: 2 mmol/L (ref 0.0–2.0)
Bicarbonate: 23.6 mmol/L (ref 20.0–28.0)
Calcium, Ion: 1.25 mmol/L (ref 1.15–1.40)
HCT: 33 % — ABNORMAL LOW (ref 39.0–52.0)
Hemoglobin: 11.2 g/dL — ABNORMAL LOW (ref 13.0–17.0)
O2 Saturation: 99 %
Potassium: 4.2 mmol/L (ref 3.5–5.1)
Sodium: 145 mmol/L (ref 135–145)
TCO2: 25 mmol/L (ref 22–32)
pCO2 arterial: 40.9 mmHg (ref 32.0–48.0)
pH, Arterial: 7.37 (ref 7.350–7.450)
pO2, Arterial: 170 mmHg — ABNORMAL HIGH (ref 83.0–108.0)

## 2019-10-07 LAB — POCT ACTIVATED CLOTTING TIME
Activated Clotting Time: 433 seconds
Activated Clotting Time: 180 seconds

## 2019-10-07 SURGERY — RIGHT/LEFT HEART CATH AND CORONARY/GRAFT ANGIOGRAPHY
Anesthesia: LOCAL

## 2019-10-07 MED ORDER — ONDANSETRON HCL 4 MG/2ML IJ SOLN: 4.0000 mg | Freq: Four times a day (QID) | INTRAMUSCULAR | Status: DC | PRN

## 2019-10-07 MED ORDER — MIDAZOLAM HCL 2 MG/2ML IJ SOLN
INTRAMUSCULAR | Status: DC | PRN
  Administered 2019-10-07 (×2): 1 mg via INTRAVENOUS

## 2019-10-07 MED ORDER — SODIUM CHLORIDE 0.9 % WEIGHT BASED INFUSION
3.0000 mL/kg/h | INTRAVENOUS | Status: DC
  Administered 2019-10-07: 3 mL/kg/h via INTRAVENOUS

## 2019-10-07 MED ORDER — POTASSIUM CHLORIDE CRYS ER 10 MEQ PO TBCR
10.0000 meq | EXTENDED_RELEASE_TABLET | Freq: Every day | ORAL | Status: DC
  Administered 2019-10-07 – 2019-10-08 (×2): 10 meq via ORAL
  Filled 2019-10-07 (×4): qty 1

## 2019-10-07 MED ORDER — SODIUM CHLORIDE 0.9 % WEIGHT BASED INFUSION: 1.0000 mL/kg/h | INTRAVENOUS | Status: DC

## 2019-10-07 MED ORDER — DIAZEPAM 5 MG PO TABS: 5.0000 mg | ORAL_TABLET | Freq: Three times a day (TID) | ORAL | Status: DC | PRN

## 2019-10-07 MED ORDER — AMLODIPINE BESYLATE 10 MG PO TABS
10.0000 mg | ORAL_TABLET | Freq: Every day | ORAL | Status: DC
  Administered 2019-10-08: 10 mg via ORAL
  Filled 2019-10-07: qty 1

## 2019-10-07 MED ORDER — ASPIRIN EC 81 MG PO TBEC
81.0000 mg | DELAYED_RELEASE_TABLET | Freq: Every day | ORAL | Status: DC
  Administered 2019-10-08: 81 mg via ORAL
  Filled 2019-10-07: qty 1

## 2019-10-07 MED ORDER — HYDRALAZINE HCL 20 MG/ML IJ SOLN: 10.0000 mg | INTRAMUSCULAR | Status: AC | PRN

## 2019-10-07 MED ORDER — SODIUM CHLORIDE 0.9% FLUSH: 3.0000 mL | INTRAVENOUS | Status: DC | PRN

## 2019-10-07 MED ORDER — LIDOCAINE HCL (PF) 1 % IJ SOLN
INTRAMUSCULAR | Status: AC
  Filled 2019-10-07: qty 30

## 2019-10-07 MED ORDER — HEPARIN (PORCINE) IN NACL 1000-0.9 UT/500ML-% IV SOLN
INTRAVENOUS | Status: AC
  Filled 2019-10-07: qty 1000

## 2019-10-07 MED ORDER — SODIUM CHLORIDE 0.9 % IV SOLN: INTRAVENOUS | Status: AC

## 2019-10-07 MED ORDER — SODIUM CHLORIDE 0.9 % IV SOLN: 250.0000 mL | INTRAVENOUS | Status: DC | PRN

## 2019-10-07 MED ORDER — HEPARIN (PORCINE) IN NACL 1000-0.9 UT/500ML-% IV SOLN
INTRAVENOUS | Status: DC | PRN
  Administered 2019-10-07 (×2): 500 mL

## 2019-10-07 MED ORDER — NITROGLYCERIN 1 MG/10 ML FOR IR/CATH LAB
INTRA_ARTERIAL | Status: DC | PRN
  Administered 2019-10-07: 150 ug

## 2019-10-07 MED ORDER — FENTANYL CITRATE (PF) 100 MCG/2ML IJ SOLN
INTRAMUSCULAR | Status: AC
  Filled 2019-10-07: qty 2

## 2019-10-07 MED ORDER — FENTANYL CITRATE (PF) 100 MCG/2ML IJ SOLN
INTRAMUSCULAR | Status: DC | PRN
  Administered 2019-10-07 (×2): 25 ug via INTRAVENOUS

## 2019-10-07 MED ORDER — NITROGLYCERIN 1 MG/10 ML FOR IR/CATH LAB
INTRA_ARTERIAL | Status: AC
  Filled 2019-10-07: qty 10

## 2019-10-07 MED ORDER — SODIUM CHLORIDE 0.9% FLUSH: 3.0000 mL | Freq: Two times a day (BID) | INTRAVENOUS | Status: DC

## 2019-10-07 MED ORDER — OXYCODONE HCL 5 MG PO TABS: 5.0000 mg | ORAL_TABLET | ORAL | Status: DC | PRN

## 2019-10-07 MED ORDER — LABETALOL HCL 5 MG/ML IV SOLN: 10.0000 mg | INTRAVENOUS | Status: AC | PRN

## 2019-10-07 MED ORDER — SERTRALINE HCL 50 MG PO TABS
50.0000 mg | ORAL_TABLET | Freq: Every morning | ORAL | Status: DC
  Administered 2019-10-07: 50 mg via ORAL
  Filled 2019-10-07: qty 1

## 2019-10-07 MED ORDER — SODIUM CHLORIDE 0.9% FLUSH
3.0000 mL | Freq: Two times a day (BID) | INTRAVENOUS | Status: DC
  Administered 2019-10-08: 3 mL via INTRAVENOUS

## 2019-10-07 MED ORDER — ASPIRIN 81 MG PO CHEW: 81.0000 mg | CHEWABLE_TABLET | ORAL | Status: DC

## 2019-10-07 MED ORDER — BIVALIRUDIN BOLUS VIA INFUSION - CUPID
INTRAVENOUS | Status: DC | PRN
  Administered 2019-10-07: 58.875 mg via INTRAVENOUS

## 2019-10-07 MED ORDER — CLOPIDOGREL BISULFATE 75 MG PO TABS
75.0000 mg | ORAL_TABLET | Freq: Every day | ORAL | Status: DC
  Administered 2019-10-08: 75 mg via ORAL
  Filled 2019-10-07: qty 1

## 2019-10-07 MED ORDER — ENALAPRIL MALEATE 20 MG PO TABS
20.0000 mg | ORAL_TABLET | Freq: Every day | ORAL | Status: DC
  Administered 2019-10-07 – 2019-10-08 (×2): 20 mg via ORAL
  Filled 2019-10-07 (×2): qty 1

## 2019-10-07 MED ORDER — ACETAMINOPHEN 325 MG PO TABS
650.0000 mg | ORAL_TABLET | ORAL | Status: DC | PRN
  Administered 2019-10-07: 650 mg via ORAL
  Filled 2019-10-07: qty 2

## 2019-10-07 MED ORDER — IOHEXOL 350 MG/ML SOLN
INTRAVENOUS | Status: AC
  Filled 2019-10-07: qty 1

## 2019-10-07 MED ORDER — CLOPIDOGREL BISULFATE 300 MG PO TABS
ORAL_TABLET | ORAL | Status: DC | PRN
  Administered 2019-10-07: 600 mg via ORAL

## 2019-10-07 MED ORDER — CLOPIDOGREL BISULFATE 300 MG PO TABS
ORAL_TABLET | ORAL | Status: AC
  Filled 2019-10-07: qty 2

## 2019-10-07 MED ORDER — SODIUM CHLORIDE 0.9 % IV SOLN
INTRAVENOUS | Status: DC | PRN
  Administered 2019-10-07: 1.75 mg/kg/h via INTRAVENOUS

## 2019-10-07 MED ORDER — METOPROLOL TARTRATE 50 MG PO TABS
50.0000 mg | ORAL_TABLET | Freq: Every day | ORAL | Status: DC
  Administered 2019-10-08: 50 mg via ORAL
  Filled 2019-10-07: qty 1

## 2019-10-07 MED ORDER — FUROSEMIDE 40 MG PO TABS
40.0000 mg | ORAL_TABLET | Freq: Every day | ORAL | Status: DC
  Administered 2019-10-07 – 2019-10-08 (×2): 40 mg via ORAL
  Filled 2019-10-07 (×2): qty 1

## 2019-10-07 MED ORDER — PANTOPRAZOLE SODIUM 40 MG PO TBEC
40.0000 mg | DELAYED_RELEASE_TABLET | Freq: Every day | ORAL | Status: DC
  Administered 2019-10-07 – 2019-10-08 (×2): 40 mg via ORAL
  Filled 2019-10-07 (×2): qty 1

## 2019-10-07 MED ORDER — IOHEXOL 350 MG/ML SOLN
INTRAVENOUS | Status: DC | PRN
  Administered 2019-10-07: 150 mL

## 2019-10-07 MED ORDER — BIVALIRUDIN TRIFLUOROACETATE 250 MG IV SOLR
INTRAVENOUS | Status: AC
  Filled 2019-10-07: qty 250

## 2019-10-07 MED ORDER — LIDOCAINE HCL (PF) 1 % IJ SOLN
INTRAMUSCULAR | Status: DC | PRN
  Administered 2019-10-07: 12 mL

## 2019-10-07 MED ORDER — ROSUVASTATIN CALCIUM 20 MG PO TABS
20.0000 mg | ORAL_TABLET | Freq: Every day | ORAL | Status: DC
  Administered 2019-10-07 – 2019-10-08 (×2): 20 mg via ORAL
  Filled 2019-10-07 (×2): qty 1

## 2019-10-07 MED ORDER — GABAPENTIN 300 MG PO CAPS
300.0000 mg | ORAL_CAPSULE | Freq: Two times a day (BID) | ORAL | Status: DC
  Administered 2019-10-08: 300 mg via ORAL
  Filled 2019-10-07 (×2): qty 1

## 2019-10-07 MED ORDER — MIDAZOLAM HCL 2 MG/2ML IJ SOLN
INTRAMUSCULAR | Status: AC
  Filled 2019-10-07: qty 2

## 2019-10-07 SURGICAL SUPPLY — 25 items
BALLN SAPPHIRE 2.0X15 (BALLOONS) ×2
BALLN SAPPHIRE ~~LOC~~ 2.75X12 (BALLOONS) ×1 IMPLANT
BALLOON SAPPHIRE 2.0X15 (BALLOONS) IMPLANT
CATH INFINITI 5 FR AL2 (CATHETERS) ×1 IMPLANT
CATH INFINITI 5FR MULTPACK ANG (CATHETERS) ×1 IMPLANT
CATH LAUNCHER 6FR AL1 (CATHETERS) IMPLANT
CATH SWAN GANZ 7F STRAIGHT (CATHETERS) ×1 IMPLANT
CATHETER LAUNCHER 6FR AL1 (CATHETERS) ×2
DEVICE CLOSURE PERCLS PRGLD 6F (VASCULAR PRODUCTS) IMPLANT
KIT ENCORE 26 ADVANTAGE (KITS) ×1 IMPLANT
KIT HEART LEFT (KITS) ×2 IMPLANT
PACK CARDIAC CATHETERIZATION (CUSTOM PROCEDURE TRAY) ×2 IMPLANT
PERCLOSE PROGLIDE 6F (VASCULAR PRODUCTS) ×2
SHEATH PINNACLE 5F 10CM (SHEATH) ×1 IMPLANT
SHEATH PINNACLE 6F 10CM (SHEATH) ×1 IMPLANT
SHEATH PINNACLE 7F 10CM (SHEATH) ×1 IMPLANT
STENT SYNERGY XD 2.50X20 (Permanent Stent) IMPLANT
SYNERGY XD 2.50X20 (Permanent Stent) ×2 IMPLANT
TRANSDUCER W/STOPCOCK (MISCELLANEOUS) ×2 IMPLANT
TUBING CIL FLEX 10 FLL-RA (TUBING) ×2 IMPLANT
WIRE COUGAR XT STRL 190CM (WIRE) ×1 IMPLANT
WIRE EMERALD 3MM-J .025X260CM (WIRE) ×1 IMPLANT
WIRE EMERALD 3MM-J .035X150CM (WIRE) ×1 IMPLANT
WIRE EMERALD ST .035X150CM (WIRE) ×1 IMPLANT
WIRE HI TORQ WHISPER MS 190CM (WIRE) ×1 IMPLANT

## 2019-10-07 NOTE — Interval H&P Note (Signed)
History and Physical Interval Note:  10/07/2019 10:35 PM  Jack Hodges  has presented today for surgery, with the diagnosis of severe aortic stenosis/CAD.  The various methods of treatment have been discussed with the patient and family. After consideration of risks, benefits and other options for treatment, the patient has consented to  Procedure(s): RIGHT/LEFT HEART CATH AND CORONARY/GRAFT ANGIOGRAPHY (N/A) CORONARY STENT INTERVENTION (N/A) as a surgical intervention.  The patient's history has been reviewed, patient examined, no change in status, stable for surgery.  I have reviewed the patient's chart and labs.  Questions were answered to the patient's satisfaction.     Tonny Bollman

## 2019-10-07 NOTE — Progress Notes (Signed)
Site area:Right femoral vein  (ACT prior to pulling = 180)  Site Prior to Removal: level 0  Pressure Applied For:  15 mins  Minutes Beginning at: 1405  Manual:Yes  Patient Status During Pull: without complaints, A/O, resting comfortably  Post Pull Groin Site: level 0  Post Pull Instructions Given:  yes, verbalized and demonstrated understanding  Dressing Applied:  Gauze, tegaderm  Bedrest started at:  1420

## 2019-10-07 NOTE — Progress Notes (Signed)
Patient has difficulty voiding while on bedrest, abdomen is distended and firm, per patient this is normal for him. Patient does not complain of abdominal pain. Bladder scan read >999, encouraged patient to void in urinal but only urinated 100cc. Attempted in and out cath x 1, however met resistance and stopped.  Patients bed rest is over at 420 pm, will have patient stand and void when bedrest has ended.  Bhagat PA notified.

## 2019-10-08 ENCOUNTER — Encounter: Payer: Self-pay | Admitting: Physician Assistant

## 2019-10-08 ENCOUNTER — Encounter (HOSPITAL_COMMUNITY): Payer: Self-pay | Admitting: Cardiovascular Disease

## 2019-10-08 DIAGNOSIS — K5730 Diverticulosis of large intestine without perforation or abscess without bleeding: Secondary | ICD-10-CM | POA: Diagnosis not present

## 2019-10-08 DIAGNOSIS — R0609 Other forms of dyspnea: Secondary | ICD-10-CM | POA: Diagnosis not present

## 2019-10-08 DIAGNOSIS — I25110 Atherosclerotic heart disease of native coronary artery with unstable angina pectoris: Secondary | ICD-10-CM | POA: Diagnosis not present

## 2019-10-08 DIAGNOSIS — I700 Atherosclerosis of aorta: Secondary | ICD-10-CM | POA: Diagnosis not present

## 2019-10-08 DIAGNOSIS — E785 Hyperlipidemia, unspecified: Secondary | ICD-10-CM | POA: Diagnosis not present

## 2019-10-08 DIAGNOSIS — Z66 Do not resuscitate: Secondary | ICD-10-CM | POA: Diagnosis not present

## 2019-10-08 DIAGNOSIS — K8020 Calculus of gallbladder without cholecystitis without obstruction: Secondary | ICD-10-CM | POA: Diagnosis not present

## 2019-10-08 DIAGNOSIS — I350 Nonrheumatic aortic (valve) stenosis: Secondary | ICD-10-CM | POA: Diagnosis not present

## 2019-10-08 DIAGNOSIS — Z7982 Long term (current) use of aspirin: Secondary | ICD-10-CM | POA: Diagnosis not present

## 2019-10-08 DIAGNOSIS — I10 Essential (primary) hypertension: Secondary | ICD-10-CM | POA: Diagnosis not present

## 2019-10-08 DIAGNOSIS — Z87891 Personal history of nicotine dependence: Secondary | ICD-10-CM | POA: Diagnosis not present

## 2019-10-08 DIAGNOSIS — I2582 Chronic total occlusion of coronary artery: Secondary | ICD-10-CM | POA: Diagnosis not present

## 2019-10-08 DIAGNOSIS — Z8673 Personal history of transient ischemic attack (TIA), and cerebral infarction without residual deficits: Secondary | ICD-10-CM | POA: Diagnosis not present

## 2019-10-08 DIAGNOSIS — Z955 Presence of coronary angioplasty implant and graft: Secondary | ICD-10-CM | POA: Diagnosis not present

## 2019-10-08 DIAGNOSIS — I771 Stricture of artery: Secondary | ICD-10-CM | POA: Diagnosis not present

## 2019-10-08 DIAGNOSIS — G4730 Sleep apnea, unspecified: Secondary | ICD-10-CM | POA: Diagnosis not present

## 2019-10-08 DIAGNOSIS — Z791 Long term (current) use of non-steroidal anti-inflammatories (NSAID): Secondary | ICD-10-CM | POA: Diagnosis not present

## 2019-10-08 DIAGNOSIS — Z951 Presence of aortocoronary bypass graft: Secondary | ICD-10-CM | POA: Diagnosis not present

## 2019-10-08 DIAGNOSIS — I739 Peripheral vascular disease, unspecified: Secondary | ICD-10-CM | POA: Diagnosis not present

## 2019-10-08 DIAGNOSIS — I2510 Atherosclerotic heart disease of native coronary artery without angina pectoris: Secondary | ICD-10-CM | POA: Diagnosis not present

## 2019-10-08 DIAGNOSIS — Z79899 Other long term (current) drug therapy: Secondary | ICD-10-CM | POA: Diagnosis not present

## 2019-10-08 LAB — BASIC METABOLIC PANEL
Anion gap: 8 (ref 5–15)
BUN: 14 mg/dL (ref 8–23)
CO2: 23 mmol/L (ref 22–32)
Calcium: 8.9 mg/dL (ref 8.9–10.3)
Chloride: 112 mmol/L — ABNORMAL HIGH (ref 98–111)
Creatinine, Ser: 0.94 mg/dL (ref 0.61–1.24)
GFR calc Af Amer: >60 mL/min (ref >60)
GFR calc non Af Amer: >60 mL/min (ref >60)
Glucose, Bld: 83 mg/dL (ref 70–99)
Potassium: 3.7 mmol/L (ref 3.5–5.1)
Sodium: 143 mmol/L (ref 135–145)

## 2019-10-08 LAB — CBC
HCT: 35.5 % — ABNORMAL LOW (ref 39.0–52.0)
Hemoglobin: 12 g/dL — ABNORMAL LOW (ref 13.0–17.0)
MCH: 30.4 pg (ref 26.0–34.0)
MCHC: 33.8 g/dL (ref 30.0–36.0)
MCV: 89.9 fL (ref 80.0–100.0)
Platelets: 76 10*3/uL — ABNORMAL LOW (ref 150–400)
RBC: 3.95 MIL/uL — ABNORMAL LOW (ref 4.22–5.81)
RDW: 14.7 % (ref 11.5–15.5)
WBC: 6.6 10*3/uL (ref 4.0–10.5)
nRBC: 0 % (ref 0.0–0.2)

## 2019-10-08 MED ORDER — CLOPIDOGREL BISULFATE 75 MG PO TABS: 75.0000 mg | ORAL_TABLET | Freq: Every day | ORAL | 5 refills | Status: DC

## 2019-10-08 MED ORDER — NITROGLYCERIN 0.4 MG SL SUBL: 0.4000 mg | SUBLINGUAL_TABLET | SUBLINGUAL | 2 refills | Status: DC | PRN

## 2019-10-08 MED ORDER — PANTOPRAZOLE SODIUM 40 MG PO TBEC: 40.0000 mg | DELAYED_RELEASE_TABLET | Freq: Every day | ORAL | 2 refills | Status: DC

## 2019-10-08 NOTE — Discharge Instructions (Addendum)
PLEASE SEE A DENTIST AND HAVE ANY WORK YOU NEED DONE. THIS IS VERY IMPORTANT FOR YOUR UPCOMING VALVE SURGERY.  Medication Changes: - START Plavix 75mg  daily in addition to Aspirin 81mg  which you are already taking at home. These medications are very important and help keep your stent open. - STOP Metoprolol tartrate (Lopressor). - STOP Zetia - STOP Omeprazole which can interfere with the Plavix and START Pantoprazole 40mg  daily instead for your reflux/heart burn. - STOP Meloxicam. Recommend Tylenol instead.  Post Cardiac Catheterization: NO HEAVY LIFTING OR SEXUAL ACTIVITY X 7 DAYS. NO DRIVING X 3-5 DAYS. NO SOAKING BATHS, HOT TUBS, POOLS, ETC., X 7 DAYS.  Groin Site Care: Refer to this sheet in the next few weeks. These instructions provide you with information on caring for yourself after your procedure. Your caregiver may also give you more specific instructions. Your treatment has been planned according to current medical practices, but problems sometimes occur. Call your caregiver if you have any problems or questions after your procedure. HOME CARE INSTRUCTIONS  You may shower 24 hours after the procedure. Remove the bandage (dressing) and gently wash the site with plain soap and water. Gently pat the site dry.   Do not apply powder or lotion to the site.   Do not sit in a bathtub, swimming pool, or whirlpool for 5 to 7 days.   No bending, squatting, or lifting anything over 10 pounds (4.5 kg) as directed by your caregiver.   Inspect the site at least twice daily.   Do not drive home if you are discharged the same day of the procedure. Have someone else drive you.  What to expect:  Any bruising will usually fade within 1 to 2 weeks.   Blood that collects in the tissue (hematoma) may be painful to the touch. It should usually decrease in size and tenderness within 1 to 2 weeks.  SEEK IMMEDIATE MEDICAL CARE IF:  You have unusual pain at the groin site or down the affected leg.    You have redness, warmth, swelling, or pain at the groin site.   You have drainage (other than a small amount of blood on the dressing).   You have chills.   You have a fever or persistent symptoms for more than 72 hours.   You have a fever and your symptoms suddenly get worse.   Your leg becomes pale, cool, tingly, or numb.   You have heavy bleeding from the site. Hold pressure on the site.   Information about your medication: Plavix (anti-platelet agent)  Generic Name (Brand): clopidogrel (Plavix), once daily medication  PURPOSE: You are taking this medication along with aspirin to lower your chance of having a heart attack, stroke, or blood clots in your heart stent. These can be fatal. Brilinta and aspirin help prevent platelets from sticking together and forming a clot that can block an artery or your stent.   Common SIDE EFFECTS you may experience include: bruising or bleeding more easily, shortness of breath  Do not stop taking PLAVIX without talking to the doctor who prescribes it for you. People who are treated with a stent and stop taking Plavix too soon, have a higher risk of getting a blood clot in the stent, having a heart attack, or dying. If you stop Plavix because of bleeding, or for other reasons, your risk of a heart attack or stroke may increase.   Tell all of your doctors and dentists that you are taking Plavix. They should talk  to the doctor who prescribed Plavix for you before you have any surgery or invasive procedure.   Contact your health care provider if you experience: severe or uncontrollable bleeding, pink/red/brown urine, vomiting blood or vomit that looks like "coffee grounds", red or black stools (looks like tar), coughing up blood or blood clots ----------------------------------------------------------------------------------------------------------------------   You are scheduled for pre TAVR testing on Monday 10/12/19 at [facility] (Main  Entrance A, Valet Parking). Please arrive at 10:15 am in Admitting for check in.   . Your CT scans (of chest/abdomen/pelvis/heart) will begin at 10:45am   Pre procedure instructions for CT scan on 10/12/19. Please follow these instructions carefully (unless otherwise directed):  Hold all erectile dysfunction medications at least 78 hours prior to test.  On the Day/Night Before the Test: . Drink plenty of water. . Do not consume any caffeinated/decaffeinated beverages or chocolate 12 hours prior to your test. . Do not take any antihistamines 12 hours prior to your test.  On the Day of the Test: . Drink plenty of water. Do not drink any water within one hour of the test. . Do not eat any food 4 hours prior to the test. Nothing to eat after 6:45am.  . You may take your regular medications prior to the test. . HOLD Furosemide morning of the test.  After the Test: . Drink plenty of water. . After receiving IV contrast, you may experience a mild flushed feeling. This is normal. . On occasion, you may experience a mild rash up to 24 hours after the test. This is not dangerous. If this occurs, you can take Benadryl 25 mg and increase your fluid intake. . If you experience trouble breathing, this can be serious. If it is severe call 911 IMMEDIATELY. If it is mild, please call our office.   Follow-Up:  Your physician has requested Outpatient Physical Therapy evaluation.  This will be done at [facility] at [address] [place] [address].   This is scheduled on 10/20/19 at 2:15 PM   You have been referred to Dr Cornelius Moras at [facility] for further TAVR evaluation. [address] [place], [address], [place], [place] [address], [phone]  You are scheduled for surgical evaluation with Dr Cornelius Moras on July 27th, 2021 at 4:00 PM   . Please call the office on the day of your appointment to make sure the surgeon will be there and on time as many times  there are delays or rescheduling due to emergency surgery.   If you have any questions or concerns, please do not hesitate to call.  Sincerely, Carlean Jews  [phone]

## 2019-10-08 NOTE — Discharge Summary (Addendum)
Discharge Summary    Patient ID: Jack Hodges MRN: [id number]; DOB: Aug 08, 1930  Admit date: 10/07/2019 Discharge date: 10/08/2019  Primary Care Provider: Lillard Anes, MD  Primary Cardiologist: Jack Campus, MD  Primary Electrophysiologist:  None   Discharge Diagnoses    Principal Problem:   Aortic stenosis Active Problems:   Dyspnea on exertion   Dyslipidemia   Essential hypertension    Diagnostic Studies/Procedures    Right/Left Heart Catheterization 10/07/2019: 1.  Severe native vessel disease with total occlusion of the LAD, left circumflex, and right coronary arteries. 2.  Status post aortocoronary bypass surgery with continued patency of the saphenous vein graft to diagonal, saphenous vein graft to OM, and sequential saphenous vein graft to PDA and PLA branches 3.  Severe left subclavian artery stenosis proximal to the LIMA with no flow visualized in the LIMA graft 4.  Severe mid LAD stenosis just beyond the diagonal vein graft insertion site, treated successfully with PCI using a 2.5 x 20 mm Synergy DES 5.  Severe aortic stenosis by echo assessment, invasive evaluation demonstrates a mean gradient 31 mmHg, aortic valve area 1.1 cm, calcified and restricted valve difficult to cross with a straight wire  Recommendations: Overnight observation, continue TAVR evaluation, dual antiplatelet therapy with aspirin and clopidogrel at least 6 months (stable angina).  Diagnostic Dominance: Right  Intervention    _____________   History of Present Illness     Jack Hodges is a 86 y.o. male with a history of of CAD s/p CABG in 1996, severe aortic stenosis, PAD, remote CVA, sleep apnea, hypertension, and hyperlipidemia who is followed by Dr. Agustin Hodges. Patient has remote history of CABG in 1996. He has not had a cardiac catheterization since that time. He also has a history of PAD and last non-invasive imaging in 2021 showed likely severe disease in the left  superficial femoral artery. His aortic stenosis as been followed by Dr. Agustin Hodges. He was admitted to [facility] in 05/2019 with chest pain felt to be secondary to shingles. Dr. Angelena Hodges met patient at that time but he did not wish to consider TAVR. The plan was for repeat Echo in 07/2019 which ended up showing LVEF of 60-65% with moderate LVH, mild MR, and thickened/calcified aortic valve with limited leaflet excursion. Mean gradient 31 mmHg, peak gradient 53 mmHg, AVA 0.72 cm2 and dimensionless index 0.24. Patient was seen by Dr. Angelena Hodges on 10/02/2019 for follow-up at which time he reported progressive dyspnea on exertion bu no chest pain. It was felt that he would benefit from AVR but not felt to be a good candidate for conventional AVR by surgical approach given advanced age; therefore, TAVR recommended. Plan was to proceed with right/left heart cath as part of pre TAVR work-up. This was scheduled for 10/07/2019.    Hospital Course     Consultants: None  Patient presented for outpatient right/left heart cath on 10/07/2019 as part of pre TAVR work-up. Cath showed severe native vessel disease with CTO of the LAD, LCX, and RCA but continued patency of SVG to Diag, SVG to OM, and sequential SVG to PDA and PLA branches. Severe left subclavian artery stenosis proximal to LIMA was noted with no flow visualized in the LIMA graft. Also noted to have severe mid LAD stenosis just beyond Diagonal vein graft insertion site. Treated with successful PCI with DES. Patient admitted overnight for observation. He tolerated the procedure well. Right femoral cath site soft with no signs of hematoma. Renal function stable.Will have  Cardiac Rehab see prior to discharge and make sure patient can ambulate without any problems.  - Continue dual antiplatelet therapy with Aspirin 29m daily and Plavix 744mdaily for at least 6 months. Patient on Omeprazole at home so will switch to Pantoprazole now that patient is on Plavix. - Will  stop home Lopressor due to bradycardia. - Will continue Crestor to 2042maily. Patient has Zetia listed on home medications but is not sure if he is taking this. Will go ahead and stop Zetia given patient is 88 20d already feels like he is taking too many medications. LDL 67 on 09/24/2019.  Of note, patient has history of thrombocytopenia. Platelets 94,000 before cath and 76,000 today. Discussed with Dr. McAAngelena FormOK to continue with dual antiplatelet therapy.  Patient seen and examined by Dr. McAAngelena Formday and determined to be stable for discharge. Has follow-up with Structural Heart team arranged. Medications as below.   Did the patient have an acute coronary syndrome (MI, NSTEMI, STEMI, etc) this admission?:  No                               Did the patient have a percutaneous coronary intervention (stent / angioplasty)?:  Yes.     Cath/PCI Registry Performance & Quality Measures: 1. Aspirin prescribed? - Yes 2. ADP Receptor Inhibitor (Plavix/Clopidogrel, Brilinta/Ticagrelor or Effient/Prasugrel) prescribed (includes medically managed patients)? - Yes 3. High Intensity Statin (Lipitor 40-58m96m Crestor 20-40mg70mescribed? - Yes 4. For EF <40%, was ACEI/ARB prescribed? - Not Applicable (EF >/= 40%) 96%For EF <40%, Aldosterone Antagonist (Spironolactone or Eplerenone) prescribed? - Not Applicable (EF >/= 40%) 28%Cardiac Rehab Phase II ordered? - Yes    _____________  Discharge Vitals Blood pressure 134/64, pulse (!) 57, temperature 98.9 F (37.2 C), temperature source Oral, resp. rate 19, height _0  (1.676 m), weight 77 kg, SpO2 98 %.  Filed Weights   10/07/19 0716 10/07/19 1512 10/08/19 0535  Weight: 78.5 kg 85.3 kg 77 kg   General: 88 y.52 elderly Caucasian male resting comfortably in no acute distress. HEENT: Normocephalic and atraumatic. Sclera clear.  Neck: Supple. No JVD. Heart: Bradycardic with regular rhythm. Distinct S1 and S2. III/VI systolic murmur consistent with  aortic stenosis. No gallops or rubs. Radial pulses 2+ and equal bilaterally. Right femoral cath site soft with no signs of hematoma. Lungs: No increased work of breathing. Clear to ausculation bilaterally. No wheezes, rhonchi, or rales.  Abdomen: Soft, non-distended, and non-tender to palpation. MSK: Normal strength and tone for age. Extremities: No lower extremity edema.    Skin: Warm and dry. Neuro: No focal deficits. Psych: Normal affect. Responds appropriately.   Labs & Radiologic Studies    CBC Recent Labs    10/07/19 1026 10/08/19 0721  WBC  --  6.6  HGB 11.2* 12.0*  HCT 33.0* 35.5*  MCV  --  89.9  PLT  --  76*   Basic Metabolic Panel Recent Labs    10/07/19 1026 10/08/19 0721  NA 145 143  K 4.2 3.7  CL  --  112*  CO2  --  23  GLUCOSE  --  83  BUN  --  14  CREATININE  --  0.94  CALCIUM  --  8.9   Liver Function Tests No results for input(s): AST, ALT, ALKPHOS, BILITOT, PROT, ALBUMIN in the last 72 hours. No results for input(s): LIPASE, AMYLASE in the last 72 hours.  High Sensitivity Troponin:   No results for input(s): TROPONINIHS in the last 720 hours.  BNP Invalid input(s): POCBNP D-Dimer No results for input(s): DDIMER in the last 72 hours. Hemoglobin A1C No results for input(s): HGBA1C in the last 72 hours. Fasting Lipid Panel No results for input(s): CHOL, HDL, LDLCALC, TRIG, CHOLHDL, LDLDIRECT in the last 72 hours. Thyroid Function Tests No results for input(s): TSH, T4TOTAL, T3FREE, THYROIDAB in the last 72 hours.  Invalid input(s): FREET3 _____________  CARDIAC CATHETERIZATION  Result Date: 10/07/2019 1.  Severe native vessel disease with total occlusion of the LAD, left circumflex, and right coronary arteries. 2.  Status post aortocoronary bypass surgery with continued patency of the saphenous vein graft to diagonal, saphenous vein graft to OM, and sequential saphenous vein graft to PDA and PLA branches 3.  Severe left subclavian artery stenosis  proximal to the LIMA with no flow visualized in the LIMA graft 4.  Severe mid LAD stenosis just beyond the diagonal vein graft insertion site, treated successfully with PCI using a 2.5 x 20 mm Synergy DES 5.  Severe aortic stenosis by echo assessment, invasive evaluation demonstrates a mean gradient 31 mmHg, aortic valve area 1.1 cm, calcified and restricted valve difficult to cross with a straight wire Recommendations: Overnight observation, continue TAVR evaluation, dual antiplatelet therapy with aspirin and clopidogrel at least 6 months (stable angina)  Disposition   Patient is being discharged home today in good condition.  Follow-up Plans & Appointments     Follow-up Information    [facility] Follow up.   Specialty: Radiology Why: Please follow instructions on letter given to you for upcoming appointments Contact information: [address] [id number] [place]             Discharge Instructions    Diet - low sodium heart healthy   Complete by: As directed    Increase activity slowly   Complete by: As directed       Discharge Medications   Allergies as of 10/08/2019      Reactions   Poison Oak Extract Rash      Medication List    STOP taking these medications   ezetimibe 10 MG tablet Commonly known as: ZETIA   meloxicam 15 MG tablet Commonly known as: MOBIC   metoprolol tartrate 50 MG tablet Commonly known as: LOPRESSOR   omeprazole 20 MG capsule Commonly known as: PRILOSEC Replaced by: pantoprazole 40 MG tablet   pregabalin 75 MG capsule Commonly known as: LYRICA   primidone 50 MG tablet Commonly known as: MYSOLINE     TAKE these medications   acetaminophen 650 MG CR tablet Commonly known as: TYLENOL Take 650 mg by mouth every 8 (eight) hours as needed for pain.   amLODipine 10 MG tablet Commonly known as: NORVASC Take 1 tablet (10 mg total) by mouth daily.     aspirin EC 81 MG tablet Take 81 mg by mouth daily.   Blue-Emu Hemp 10 % cream Generic drug: trolamine salicylate Apply 1 application topically daily as needed for muscle pain (Hands).   clopidogrel 75 MG tablet Commonly known as: PLAVIX Take 1 tablet (75 mg total) by mouth daily with breakfast.   enalapril 20 MG tablet Commonly known as: VASOTEC Take 1 tablet (20 mg total) by mouth daily.   furosemide 40 MG tablet Commonly known as: LASIX Take 1 tablet (40 mg total) by mouth daily.   gabapentin 300 MG capsule Commonly  known as: NEURONTIN Take 300 mg by mouth 2 (two) times daily.   nitroGLYCERIN 0.4 MG SL tablet Commonly known as: Nitrostat Place 1 tablet (0.4 mg total) under the tongue every 5 (five) minutes as needed for chest pain.   pantoprazole 40 MG tablet Commonly known as: PROTONIX Take 1 tablet (40 mg total) by mouth daily. Replaces: omeprazole 20 MG capsule   potassium chloride 10 MEQ tablet Commonly known as: KLOR-CON Take 10 mEq by mouth daily.   rosuvastatin 20 MG tablet Commonly known as: CRESTOR Take 1 tablet (20 mg total) by mouth at bedtime. What changed: when to take this   sertraline 50 MG tablet Commonly known as: ZOLOFT Take 1 tablet (50 mg total) by mouth in the morning.   vitamin B-12 500 MCG tablet Commonly known as: CYANOCOBALAMIN Take 500 mcg by mouth daily.          Outstanding Labs/Studies   Repeat CBC at follow-up.  Duration of Discharge Encounter   Greater than 30 minutes including physician time.  Signed, Darreld Mclean, PA-C 10/08/2019, 9:22 AM   I have personally seen and examined this patient. I agree with the assessment and plan as outlined above.  Doing well post LAD PCI/stenting.  Will d/c home today on ASA and Plavix.  Will continue workup for TAVR  Lauree Chandler 10/08/2019 9:35 AM

## 2019-10-08 NOTE — Progress Notes (Addendum)
CARDIAC REHAB PHASE I   PRE:  Rate/Rhythm: 61 SR    BP: sitting 95/81, recheck 109/71, standing 126/67    SaO2: 99 RA  MODE:  Ambulation: 430 ft   POST:  Rate/Rhythm: 82 SR    BP: sitting 120/62     SaO2: 97 RA  Pt sts that he "just falls backward" sometimes at home. Took orthostatics, which were negative. Pt ambulated with RW in hall, steady, he was able to walk without SOB. Sts at home he was having SOB after just one third of that distance (to his mailbox). No imbalance with RW but sts he can't use RW in house. Sounds like his falls occur when he has been standing like with shaving or in shower.  Discussed Plavix importance, stent, restrictions. Stressed importance of not falling on Plavix. He does don his foot brace and shoes in morning. His son lives with him but does work. Did not refer to CRPII as pt needs TAVR 0820-0919   Harriet Masson CES, ACSM 10/08/2019 9:12 AM

## 2019-10-12 ENCOUNTER — Encounter (HOSPITAL_COMMUNITY): Payer: Self-pay

## 2019-10-12 ENCOUNTER — Other Ambulatory Visit: Payer: Self-pay

## 2019-10-12 ENCOUNTER — Ambulatory Visit (HOSPITAL_COMMUNITY)
Admission: RE | Admit: 2019-10-12 | Discharge: 2019-10-12 | Disposition: A | Discharge status: Home / Self Care | Payer: Medicare Other | Source: Home / Self Care | Attending: Physician Assistant | Admitting: Physician Assistant

## 2019-10-12 DIAGNOSIS — Z01818 Encounter for other preprocedural examination: Secondary | ICD-10-CM | POA: Diagnosis not present

## 2019-10-12 DIAGNOSIS — Z951 Presence of aortocoronary bypass graft: Secondary | ICD-10-CM | POA: Diagnosis not present

## 2019-10-12 DIAGNOSIS — K5730 Diverticulosis of large intestine without perforation or abscess without bleeding: Secondary | ICD-10-CM | POA: Diagnosis not present

## 2019-10-12 DIAGNOSIS — Z79899 Other long term (current) drug therapy: Secondary | ICD-10-CM | POA: Diagnosis not present

## 2019-10-12 DIAGNOSIS — I350 Nonrheumatic aortic (valve) stenosis: Secondary | ICD-10-CM

## 2019-10-12 DIAGNOSIS — Z8673 Personal history of transient ischemic attack (TIA), and cerebral infarction without residual deficits: Secondary | ICD-10-CM | POA: Diagnosis not present

## 2019-10-12 DIAGNOSIS — I771 Stricture of artery: Secondary | ICD-10-CM | POA: Diagnosis not present

## 2019-10-12 DIAGNOSIS — G4730 Sleep apnea, unspecified: Secondary | ICD-10-CM | POA: Diagnosis not present

## 2019-10-12 DIAGNOSIS — I358 Other nonrheumatic aortic valve disorders: Secondary | ICD-10-CM | POA: Diagnosis not present

## 2019-10-12 DIAGNOSIS — I700 Atherosclerosis of aorta: Secondary | ICD-10-CM | POA: Diagnosis not present

## 2019-10-12 DIAGNOSIS — Z7982 Long term (current) use of aspirin: Secondary | ICD-10-CM | POA: Diagnosis not present

## 2019-10-12 DIAGNOSIS — R0609 Other forms of dyspnea: Secondary | ICD-10-CM | POA: Diagnosis not present

## 2019-10-12 DIAGNOSIS — K8020 Calculus of gallbladder without cholecystitis without obstruction: Secondary | ICD-10-CM | POA: Diagnosis not present

## 2019-10-12 DIAGNOSIS — Z791 Long term (current) use of non-steroidal anti-inflammatories (NSAID): Secondary | ICD-10-CM | POA: Diagnosis not present

## 2019-10-12 DIAGNOSIS — I739 Peripheral vascular disease, unspecified: Secondary | ICD-10-CM | POA: Diagnosis not present

## 2019-10-12 DIAGNOSIS — I10 Essential (primary) hypertension: Secondary | ICD-10-CM | POA: Diagnosis not present

## 2019-10-12 DIAGNOSIS — E785 Hyperlipidemia, unspecified: Secondary | ICD-10-CM | POA: Diagnosis not present

## 2019-10-12 DIAGNOSIS — I2582 Chronic total occlusion of coronary artery: Secondary | ICD-10-CM | POA: Diagnosis not present

## 2019-10-12 DIAGNOSIS — Z955 Presence of coronary angioplasty implant and graft: Secondary | ICD-10-CM | POA: Diagnosis not present

## 2019-10-12 DIAGNOSIS — Z66 Do not resuscitate: Secondary | ICD-10-CM | POA: Diagnosis not present

## 2019-10-12 DIAGNOSIS — Z87891 Personal history of nicotine dependence: Secondary | ICD-10-CM | POA: Diagnosis not present

## 2019-10-12 DIAGNOSIS — I708 Atherosclerosis of other arteries: Secondary | ICD-10-CM | POA: Diagnosis not present

## 2019-10-12 DIAGNOSIS — I2510 Atherosclerotic heart disease of native coronary artery without angina pectoris: Secondary | ICD-10-CM | POA: Diagnosis not present

## 2019-10-12 MED ORDER — IOHEXOL 350 MG/ML SOLN
80.0000 mL | Freq: Once | INTRAVENOUS | Status: AC | PRN
  Administered 2019-10-12: 80 mL via INTRAVENOUS

## 2019-10-15 ENCOUNTER — Ambulatory Visit: Payer: Medicare Other

## 2019-10-15 ENCOUNTER — Telehealth: Payer: Medicare Other

## 2019-10-15 DIAGNOSIS — I10 Essential (primary) hypertension: Secondary | ICD-10-CM

## 2019-10-15 DIAGNOSIS — E785 Hyperlipidemia, unspecified: Secondary | ICD-10-CM

## 2019-10-15 NOTE — Chronic Care Management (AMB) (Signed)
Chronic Care Management Pharmacy  Name: Jack Hodges  MRN: [id number] DOB: 04-Jan-1931  Chief Complaint/ HPI  Jack Hodges,  86 y.o. , male presents for their Follow-Up CCM visit with the clinical pharmacist via telephone due to COVID-19 Pandemic.  PCP : Abigail Miyamoto, MD  Their chronic conditions include: CAD, hx of CVA, HTN, Spinal stenosis, and aortic stenosis.   Office Visits: 09/24/2019 - Anemia,  low platelets but no dangerous- he had this one year ago and recovered, Kidney and liver tests normal, Cholesterol normal, B12 level normal 06/25/2019 - routine follow-up. dTap ordered from Pharmacy.  06/11/2019- Patient had stopped taking amlodipine and enalapril. Stopped gabapentin and start Lyrica for shingles pain.   05/27/2019 - Gabapentin for shingles pain.  05/19/2019 - Started on valtrex and hydrocodone for shingles. 04/13/2019 - no anemia, chronically low platelets but stable, normal kidney and liver test, good cholesterol panel. 02/03/2019 - steroid injection for shoulder.    Consult Visit: 10/07/2019 - Hospitalization for stents.  10/02/2019 - Cardiology - schedule for cardiac catherization.  09/14/2019 - Referral to for evaluation for valve replacement. 06/17/2019 - ED visit for chest pain. Ruled out cardiac origin. Likely post herpetic neuralgia.  04/10/2019 - artery stenosis recommended. BP elevated in office. 140/90 per patient at home. No med changes at this time.  02/13/2019 - Dr. Kirtland Bouchard would like patient on a statin for optimal management.     Medications: Outpatient Encounter Medications as of 10/15/2019  Medication Sig   amLODipine (NORVASC) 10 MG tablet Take 1 tablet (10 mg total) by mouth daily.   aspirin EC 81 MG tablet Take 81 mg by mouth daily.   clopidogrel (PLAVIX) 75 MG tablet Take 1 tablet (75 mg total) by mouth daily with breakfast.   enalapril (VASOTEC) 20 MG tablet Take 1 tablet (20 mg total) by mouth daily.   gabapentin (NEURONTIN)  300 MG capsule Take 300 mg by mouth 2 (two) times daily.   nitroGLYCERIN (NITROSTAT) 0.4 MG SL tablet Place 1 tablet (0.4 mg total) under the tongue every 5 (five) minutes as needed for chest pain.   pantoprazole (PROTONIX) 40 MG tablet Take 1 tablet (40 mg total) by mouth daily.   potassium chloride (KLOR-CON) 10 MEQ tablet Take 10 mEq by mouth daily.   rosuvastatin (CRESTOR) 20 MG tablet Take 1 tablet (20 mg total) by mouth at bedtime. (Patient taking differently: Take 20 mg by mouth daily. )   sertraline (ZOLOFT) 50 MG tablet Take 1 tablet (50 mg total) by mouth in the morning.   trolamine salicylate (BLUE-EMU HEMP) 10 % cream Apply 1 application topically daily as needed for muscle pain (Hands).   vitamin B-12 (CYANOCOBALAMIN) 500 MCG tablet Take 500 mcg by mouth daily.   acetaminophen (TYLENOL) 650 MG CR tablet Take 650 mg by mouth every 8 (eight) hours as needed for pain. (Patient not taking: Reported on 10/15/2019)   furosemide (LASIX) 40 MG tablet Take 1 tablet (40 mg total) by mouth daily. (Patient not taking: Reported on 10/17/2019)   No facility-administered encounter medications on file as of 10/15/2019.   Allergies  Allergen Reactions   Poison Oak Extract Rash    SDOH Screenings   Alcohol Screen:    Last Alcohol Screening Score (AUDIT):   Depression (PHQ2-9): Low Risk    PHQ-2 Score: 2  Financial Resource Strain:    Difficulty of Paying Living Expenses:   Food Insecurity: No Food Insecurity   Worried About Programme researcher, broadcasting/film/video in  the Last Year: Never true   Ran Out of Food in the Last Year: Never true  Housing: Low Risk    Last Housing Risk Score: 0  Physical Activity:    Days of Exercise per Week:    Minutes of Exercise per Session:   Social Connections:    Frequency of Communication with Friends and Family:    Frequency of Social Gatherings with Friends and Family:    Attends Religious Services:    Active Member of Clubs or Organizations:     Attends Engineer, structural:    Marital Status:   Stress:    Feeling of Stress :   Tobacco Use: Medium Risk   Smoking Tobacco Use: Former Smoker   Smokeless Tobacco Use: Never Used  Transportation Needs: No Regulatory affairs officer (Medical): No   Lack of Transportation (Non-Medical): No     Current Diagnosis/Assessment:  Goals Addressed            This Visit's Progress    Pharmacy Care Plan       CARE PLAN ENTRY  Current Barriers:   Chronic Disease Management support, education, and care coordination needs related to Hypertension and Hyperlipidemia   Hypertension  Pharmacist Clinical Goal(s): o Over the next 90 days, patient will work with PharmD and providers to maintain BP goal <140/90  Current regimen:  o amlodipine 10 mg daily o enalapril 20 mg daily o Potassium Chloride daily  Interventions: o Recommend patient continue taking medications as prescribed.   Patient self care activities - Over the next 90 days, patient will: o Check BP daily, document, and provide at future appointments o Ensure daily salt intake < 2300 mg/day  Hyperlipidemia  Pharmacist Clinical Goal(s): o Over the next 90 days, patient will work with PharmD and providers to maintain LDL goal < 70  Current regimen:  o zetia 10 mg o aspirin ec 81 mg o Clopidogrel 75 mg daily o rosuvastatin 20 mg daily.  Interventions: o Recommend patient continue to take medications as prescribed.   Patient self care activities - Over the next 90 days, patient will: o Continue to follow-up with Dr. Marina Goodell and cardiology and contact providers as needed. .   Medication management  Pharmacist Clinical Goal(s): o Over the next 90 days, patient will work with PharmD and providers to achieve optimal medication adherence  Current pharmacy: [facility]  Interventions o Comprehensive medication review performed. o Continue current medication management  strategy  Patient self care activities - Over the next 90 days, patient will: o Focus on medication adherence by continuing to take medications as prescribed.  o Take medications as prescribed o Report any questions or concerns to PharmD and/or provider(s)  Please see past updates related to this goal by clicking on the "Past Updates" button in the selected goal          Hypertension   BP today is:  <130/80  Office blood pressures are  BP Readings from Last 3 Encounters:  10/12/19 127/71  10/08/19 134/64  10/02/19 110/60   Lab Results  Component Value Date   CREATININE 0.94 10/08/2019   CREATININE 1.14 10/02/2019   CREATININE 0.95 09/24/2019   Kidney Function Lab Results  Component Value Date/Time   CREATININE 0.94 10/08/2019 07:21 AM   CREATININE 1.14 10/02/2019 08:45 AM   GFRNONAA >60 10/08/2019 07:21 AM   GFRAA >60 10/08/2019 07:21 AM   K 3.7 10/08/2019 07:21 AM   K 4.2  10/07/2019 10:26 AM     Patient has failed these meds in the past: hctz 25 mg daily Patient is currently controlled on the following medications:   amlodipine 10 mg daily  enalapril 20 mg daily  Potassium Chloride daily  Patient checks BP at home twice daily  Patient home BP readings are ranging: 133/73 pulse 68  We discussed Medication and medication adherence. Patient ran out of metoprolol and furosemide. We now have him refills at the pharmacy to pickup. He states that his blood pressure has been much better on his medications. He is checking twice daily at home.    Update 09/10/2019 - BP is doing good per patient. 113/70 per patient. His medications have been changed post discharge. He is scheduled to have a valve replaced next month.   Plan  Continue current medications   CAD/Hx of CVA/Dyslipidemia   Lipid Panel     Component Value Date/Time   CHOL 127 09/24/2019 0850   TRIG 140 09/24/2019 0850   HDL 35 (L) 09/24/2019 0850   CHOLHDL 3.6 09/24/2019 0850   LDLCALC 67  09/24/2019 0850   LABVLDL 25 09/24/2019 0850     The ASCVD Risk score (Goff DC Jr., et al., 2013) failed to calculate for the following reasons:   The 2013 ASCVD risk score is only valid for ages 33 to 20   The patient has a prior MI or stroke diagnosis   Patient has failed these meds in past: simvastatin, ezetimibe Patient is currently controlled on the following medications:   aspirin ec 81 mg  rosuvastatin 20 mg daily.  Clopidogrel 75 mg daily  We discussed:  Patient cannot exercise due to drop foot and shakiness. He still mows his yard. Patient takes medications and last cholseterol panel was good.   09/10/2019 - Cardiology visit next Monday about a valve. He states things are going well.   Update 10/15/2019 - Patient scheduled for valve replacement next month. Has had recent catherization.   Plan  Continue current medications    and  Spinal Stenosis/Post herpetic neuralgia:    Patient has failed these meds in past: Lyrica, meloxicam 15 mg daily, oxycodone-apap,gabapentin 300 mg 2 caps po bid Patient is currently uncontrolled on the following medications: hydrocodone-apap 5/325 1-2 q4h prn - not using, acetaminophen 650 mg q8h prn - not taking often, pregabalin 75 mg bid  We discussed:  Patient was taking gabapentin and pregabalin together. He didn't realize he was only supposed to be taking 1. Pharmacist counseled that they are not to be used together and patient removed it from the lineup.   Update 09/10/2019 - Pain is improved. He still has shingles rash. Patient is hoping he can stop Lyrica soon.   Update 10/15/2019 - Patient now taking gabapentin after hospitalization. He has completed course of pregabalin for shingles pain. He reports shingles pain improved.   Plan  Continue current medications   Heartburn   Patient has failed these meds in past: omeprazole Patient is currently controlled on the following medications:   pantoprazole 40 mg daily   We  discussed:  Patient states that heartburn is well controlled with this medication. He has no issue with it since beginning.    Update 09/11/2019 - He says he can't tolerate missing doses or coming off of PPI.   Update 10/15/2019 - Patient states medication was changed in the hospital due to beginning plavix and interaction of omeprazole and clopidogrel. Patient reports that he is well controlled on pantoprazole.  Plan  Continue current medications   Mood Stabilizer:   Patient has failed these meds in past: alprazolam 0.5 mg daily prn, trazodone 100 mg qhs, trintellix 5 mg daily Patient is currently controlled on the following medications:   sertraline 50 mg tablet  We discussed:  Patient lost his wife and son in the past few years. He said that he feels like he's been under water since those losses. He inidicates that the sertraline is helping a lot.   Update 09/10/2019 - Patient reports stable on medications.   Update 10/15/2019 -Patient reports good control with therapy.   Plan  Continue current medications   Health Maintenance   Patient is currently controlled on the following medications:   Vitamin b-12 500 mcg - daily Meclizine 25 mg daily prn - dizziness  We discussed:  Patient states that he is not taking the meclizine often. Pharmacist counseled on somnolence and fall risk with meclizine.   Update 09/10/2019 - Patient states he has been dealing with vertigo since he fell in the hospital years ago and hit his head. States he uses caution walking and getting on/off lawn mower to avoid falls.   Plan  Continue current medications  Vaccines   Reviewed and discussed patient's vaccination history. Hasn't taken COVID vaccine. He's concerned about the new vaccine. Just received dTap vaccine last Friday.   Update 09/11/2019 - Patient still uncertain about taking COVID vaccine. There is no recorded Pneumovax in the chart.   Immunization History  Administered Date(s)  Administered   Fluad Quad(high Dose 65+) 12/10/2018   Pneumococcal Conjugate-13 12/20/2017   Tdap 07/03/2019    Plan  Recommend patient receive annual flu vaccine in office. Recommend Pneumovax 23 if not received outside of office.    Medication Management   Pt uses [facility] for all medications Patient cannot use a pill box due to tremor.   Pt endorses good compliance but had been out of a few of his medications for several days.   We discussed:getting all prescriptions lined up with mail-order for cost savings. Patient is going to pick up his furosemide and metoprolol locally today to avoid being out of them.   Update 09/10/2019 - Patient reports having his medications from mail order for free with no issue. He is going to take all of his medication in at Cardiology appointment. I've also asked patient to bring all meds in when he comes to see Dr. Marina Goodell next. I will help him review and make sure he understands how he takes each one.   Update 10/17/2019 - Patient will set up appointment to bring in medication once his next procedure is completed. Patient reviewed all medications on the phone and verified appropriate directions.   Plan Pharmacist will monitor patient for adherence and medication efficacy.    Follow up: 2 month

## 2019-10-16 ENCOUNTER — Encounter: Payer: Self-pay | Admitting: Physician Assistant

## 2019-10-17 NOTE — Patient Instructions (Signed)
Visit Information  Goals Addressed            This Visit's Progress    Pharmacy Care Plan       CARE PLAN ENTRY  Current Barriers:   Chronic Disease Management support, education, and care coordination needs related to Hypertension and Hyperlipidemia   Hypertension  Pharmacist Clinical Goal(s): o Over the next 90 days, patient will work with PharmD and providers to maintain BP goal <140/90  Current regimen:  o amlodipine 10 mg daily o enalapril 20 mg daily o Potassium Chloride daily  Interventions: o Recommend patient continue taking medications as prescribed.   Patient self care activities - Over the next 90 days, patient will: o Check BP daily, document, and provide at future appointments o Ensure daily salt intake < 2300 mg/day  Hyperlipidemia  Pharmacist Clinical Goal(s): o Over the next 90 days, patient will work with PharmD and providers to maintain LDL goal < 70  Current regimen:  o zetia 10 mg o aspirin ec 81 mg o Clopidogrel 75 mg daily o rosuvastatin 20 mg daily.  Interventions: o Recommend patient continue to take medications as prescribed.   Patient self care activities - Over the next 90 days, patient will: o Continue to follow-up with Dr. Marina Goodell and cardiology and contact providers as needed. .   Medication management  Pharmacist Clinical Goal(s): o Over the next 90 days, patient will work with PharmD and providers to achieve optimal medication adherence  Current pharmacy: [facility]  Interventions o Comprehensive medication review performed. o Continue current medication management strategy  Patient self care activities - Over the next 90 days, patient will: o Focus on medication adherence by continuing to take medications as prescribed.  o Take medications as prescribed o Report any questions or concerns to PharmD and/or provider(s)  Please see past updates related to this goal by clicking on the "Past Updates" button in the  selected goal          The patient verbalized understanding of instructions provided today and declined a print copy of patient instruction materials.   Telephone follow up appointment with pharmacy team member scheduled for: 11/2019  Juliane Lack, PharmD, [facility] Clinical Pharmacist [facility] [phone] (office) [phone] (mobile)

## 2019-10-20 ENCOUNTER — Encounter: Payer: Self-pay | Admitting: Thoracic Surgery (Cardiothoracic Vascular Surgery)

## 2019-10-20 ENCOUNTER — Institutional Professional Consult (permissible substitution): Payer: Medicare Other | Admitting: Thoracic Surgery (Cardiothoracic Vascular Surgery)

## 2019-10-20 ENCOUNTER — Ambulatory Visit: Payer: Medicare Other | Attending: Physician Assistant | Admitting: Physical Therapy

## 2019-10-20 ENCOUNTER — Other Ambulatory Visit: Payer: Self-pay

## 2019-10-20 VITALS — BP 104/66 | HR 76 | Temp 97.9°F | Resp 20 | Ht 66.0 in | Wt 169.8 lb

## 2019-10-20 DIAGNOSIS — R262 Difficulty in walking, not elsewhere classified: Secondary | ICD-10-CM | POA: Insufficient documentation

## 2019-10-20 DIAGNOSIS — I2510 Atherosclerotic heart disease of native coronary artery without angina pectoris: Secondary | ICD-10-CM

## 2019-10-20 DIAGNOSIS — I350 Nonrheumatic aortic (valve) stenosis: Secondary | ICD-10-CM | POA: Diagnosis not present

## 2019-10-20 NOTE — Patient Instructions (Signed)
Continue taking all current medications without change through the day before surgery.  Make sure to bring all of your medications with you when you come for your Pre-Admission Testing appointment at [facility] Short-Stay Department.  Have nothing to eat or drink after midnight the night before surgery.  On the morning of surgery take only Protonix with a sip of water.  At your appointment for Pre-Admission Testing at the [facility] Short-Stay Department you will be asked to sign permission forms for your upcoming surgery.  By definition your signature on these forms implies that you and/or your designee provide full informed consent for your planned surgical procedure(s), that alternative treatment options have been discussed, that you understand and accept any and all potential risks, and that you have some understanding of what to expect for your post-operative convalescence.  For any major cardiac surgical procedure potential operative risks include but are not limited to at least some risk of death, stroke or other neurologic complication, myocardial infarction, congestive heart failure, respiratory failure, renal failure, bleeding requiring blood transfusion and/or reexploration, irregular heart rhythm, heart block or bradycardia requiring permanent pacemaker, pneumonia, pericardial effusion, pleural effusion, wound infection, pulmonary embolus or other thromboembolic complication, chronic pain, or other complications related to the specific procedure(s) performed.  For transcatheter aortic valve replacement additional risks include but are not limited to risk of paravalvular leak, valve embolization, valve thrombosis, aortic dissection, aortic rupture, ventricular septal defect or perforation, pericardial tamponade, injury of the abdominal aorta or its branches, and/or injury or occlusion of the arteries going to your arms or legs.  Please call to schedule a  follow-up appointment in our office prior to surgery if you have any unresolved questions about your planned surgical procedure, the associated risks, alternative treatment options, and/or expectations for your post-operative recovery.    

## 2019-10-20 NOTE — Therapy (Signed)
[facility] [address] [place], [place], [address] Phone: [phone]   Fax:  [phone]  Physical Therapy Evaluation/TAVR  Patient Details  Name: Jack Hodges MRN: [id number] Date of Birth: Oct 27, 1930 Referring Provider (PT): Janetta Hora, [place]   Encounter Date: 10/20/2019   PT End of Session - 10/20/19 1338    Visit Number 1    PT Start Time 1334    PT Stop Time 1400    PT Time Calculation (min) 26 min    Activity Tolerance Patient tolerated treatment well    Behavior During Therapy [facility] for tasks assessed/performed           Past Medical History:  Diagnosis Date  . Arthritis   . Bilateral foot-drop 01/18/2017  . Cellulitis of left hand 07/07/2019  . Coronary artery disease involving native coronary artery of native heart without angina pectoris 11/01/2015   Overview:  Bypass surgery in 1996 Overview:  Overview:  Bypass surgery in 1996  Last Assessment & Plan:  Continue his current regimen.  Follow up as noted.  . CVA (cerebral vascular accident) (HCC) 04/03/2016   Last Assessment & Plan:  The patient underwent extensive CVA workup.  MRI is negative.  He does have aortic stenosis by echocardiogram.  Upon review of Care everywhere he is followed by Dr. Bing Matter, cardiologist for this.  Carotid ultrasound negative for hemodynamically significant stenosis.  Lipid panel within normal limits.  He has not had any further episodes of dizziness or nausea vomiting.   . Dementia (HCC)   . Depression   . Dizziness 04/19/2016  . DNR (do not resuscitate) 04/03/2016   Overview:  I had an extensive discussion with the patient on 04/03/2016 regarding his end of life wishes.  He and his daughter agree that do not resuscitate is in keeping with his beliefs.  . Dyslipidemia 11/01/2015   Last Assessment & Plan:  Formatting of this note might be different from the original. Cholesterol 136   Triglycerides  83  HDL  50.1  LDL Calculated  69   VLDL Cholesterol Cal  17   Continue statin.  . Essential hypertension 11/01/2015   Last Assessment & Plan:  Resume home medications.  Follow-up as noted above.  Marland Kitchen Herpes zoster 05/19/2019  . Neuropathy   . Other chest pain   . Recurrent falls 06/25/2019  . Severe aortic stenosis   . Sleep apnea   . 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    Past Surgical History:  Procedure Laterality Date  . CARDIAC SURGERY    . CORONARY STENT INTERVENTION N/A 10/07/2019   Procedure: CORONARY STENT INTERVENTION;  Surgeon: Tonny Bollman, MD;  Location: [facility] INVASIVE CV LAB;  Service: Cardiovascular;  Laterality: N/A;  . RIGHT/LEFT HEART CATH AND CORONARY/GRAFT ANGIOGRAPHY N/A 10/07/2019   Procedure: RIGHT/LEFT HEART CATH AND CORONARY/GRAFT ANGIOGRAPHY;  Surgeon: Tonny Bollman, MD;  Location: [facility] INVASIVE CV LAB;  Service: Cardiovascular;  Laterality: N/A;    There were no vitals filed for this visit.    Subjective Assessment - 10/20/19 1339    Subjective Started about 2-3 years ago noticed SOB. Wife passed about 4 years ago and son a year after that. Neuropathy in both hands.    Currently in Pain? Yes    Pain Score 7     Pain Location Head    Pain Orientation  Left    Pain Descriptors / Indicators --   neuropathy   Aggravating Factors  constant    Pain Relieving Factors unknown              [facility] PT Assessment - 10/20/19 0001      Assessment   Medical Diagnosis severe aortic stenosis    Referring Provider (PT) Janetta Hora, PA-C    Onset Date/Surgical Date --   2-3 years ago   Hand Dominance Right      Precautions   Precautions Fall      Restrictions   Weight Bearing Restrictions No      Balance Screen   Has the patient fallen in the past 6 months Yes    How many times? "a lot of times" my left leg is dead    Has the patient had a decrease in  activity level because of a fear of falling?  Yes    Is the patient reluctant to leave their home because of a fear of falling?  No      Home Environment   Living Environment Private residence    Additional Comments stairs into basement that I climb regularly      Prior Function   Level of Independence Independent      Cognition   Overall Cognitive Status Within Functional Limits for tasks assessed      Observation/Other Assessments   Focus on Therapeutic Outcomes (FOTO)  n/a TAVR      Sensation   Additional Comments N/T in hands, feet are numb and legs are cold as ice      Posture/Postural Control   Posture Comments forward head      ROM / Strength   AROM / PROM / Strength AROM;Strength      AROM   Overall AROM Comments WFL      Strength   Overall Strength Comments gross 5/5 except Rt DF 2/5, Lt DF0/5    Strength Assessment Site Hand    Right/Left hand Right;Left    Right Hand Grip (lbs) 50    Left Hand Grip (lbs) 40      Ambulation/Gait   Gait Comments SPC in rt hand, Lt drop foot. ASO on Lt foot            [facility] Pre-Surgical Assessment - 10/20/19 0001    5 Meter Walk Test- trial 1 7 sec    5 Meter Walk Test- trial 2 6 sec.     5 Meter Walk Test- trial 3 5 sec.    5 meter walk test average 6 sec    comment 0    Sit To Stand Test- trial 1 24 sec.    Comment legs pressing on back of chair    6 Minute Walk- Baseline yes    BP (mmHg) 111/54    HR (bpm) 62    02 Sat (%RA) 97 %    Modified Borg Scale for Dyspnea 0- Nothing at all    Perceived Rate of Exertion (Borg) 6-    6 Minute Walk Post Test yes    BP (mmHg) 128/69    HR (bpm) 81    02 Sat (%RA) 96 %    Modified Borg Scale for Dyspnea 1- Very mild shortness of breath    Perceived Rate of Exertion (Borg) 9- very light    Aerobic Endurance Distance Walked 740    Endurance additional comments 46% disability compared to age-related normative values  Objective measurements  completed on examination: See above findings.                            Plan - 10/20/19 1415    PT Frequency One time visit    Consulted and Agree with Plan of Care Patient           Clinical Impression Statement: Pt is a 86 yo M presenting to OP PT for evaluation prior to possible TAVR surgery due to severe aortic stenosis. Pt reports onset of SOB 2-3 years ago. Symptoms are  limiting functional endurance. Pt presents with WFL ROM and strength (with exception of DF bilaterally) and reports musculoskeletal pain in hands due to neuropathy.   Pt ambulated a total of 740 feet in 6 minute walk and reported 1/10 SOB on modified scale for dyspena and 9/20 RPE on Borg's perceived exertion and pain scale at the end of the walk. During the 6 minute walk test, patient's HR increased to 101 BPM and O2 saturation decreased to 96%. Based on the Short Physical Performance Battery, patient has a frailty rating of 5/12 with </= 5/12 considered frail.  Visit Diagnosis: Difficulty in walking, not elsewhere classified     Problem List Patient Active Problem List   Diagnosis Date Noted  . Angina pectoris (HCC) 10/07/2019  . Atherosclerosis of aorta (HCC) 09/24/2019  . BMI 27.0-27.9,adult 09/24/2019  . Cellulitis of left hand 07/07/2019  . Recurrent falls 06/25/2019  . Severe aortic stenosis   . Other chest pain   . Herpes zoster 05/19/2019  . Dyspnea on exertion 06/30/2018  . Aortic stenosis 05/02/2017  . Bilateral foot-drop 01/18/2017  . 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 Dizziness 04/19/2016  . CVA (cerebral vascular accident) (HCC) 04/03/2016  . DNR (do not resuscitate) 04/03/2016  . Coronary artery disease involving native coronary artery of native heart without angina pectoris 11/01/2015  . Dyslipidemia 11/01/2015  . Essential hypertension 11/01/2015  . Status post coronary artery bypass graft 11/01/2015   Jack Raynor C.  Iline Buchinger PT, DPT 10/20/19 2:17 PM   [facility] [address] [place], [place], [address] Phone: [phone]   Fax:  [phone]  Name: Jack Hodges MRN: [id number] Date of Birth: 05/04/1930

## 2019-10-20 NOTE — H&P (View-Only) (Signed)
HEART AND [place] SURGERY CONSULTATION REPORT  Referring Provider is Park Liter, MD PCP is Lillard Anes, MD  Chief Complaint  Patient presents with  . Aortic Stenosis    new patient consultation, review all studies    HPI:  Patient is an 86 year old male with history of coronary artery disease status post coronary artery bypass grafting in 1996 and recently status post PCI and stenting, aortic stenosis, hypertension, peripheral arterial disease with left subclavian stenosis, remote history of stroke, hyperlipidemia, peripheral neuropathy with bilateral foot drop, and mild dementia who has been referred for surgical consultation to discuss treatment options for management of severe symptomatic aortic stenosis.  Patient's cardiac history dates back to 1996 when he presented with symptomatic coronary artery disease.  He underwent coronary artery bypass grafting x5 and did very well.  He has developed aortic stenosis that gradually progressed in severity on follow-up imaging.  In March 2021 he was hospitalized with atypical chest pain that was felt likely related to shingles.  He recently underwent follow-up echocardiogram that revealed significant progression and severity of aortic stenosis with preserved left ventricular systolic function.  He was seen in follow-up by Dr. Agustin Cree and referred to the multidisciplinary heart valve clinic where he was evaluated by Dr. Angelena Form.  He subsequently underwent diagnostic cardiac catheterization on October 07, 2019.  He was noted to have severe native coronary artery disease with chronic total occlusion of the left anterior descending coronary artery, left circumflex coronary artery, and the right coronary arteries.  He had continued patency of the greater saphenous vein grafts to the diagonal branch which filled the left anterior descending coronary artery, the obtuse marginal  branch, and sequential saphenous vein grafts to both the posterior descending and the posterior lateral branches.  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  There was significant high-grade stenosis in the left anterior descending coronary artery just beyond the diagonal vein graft insertion site.  He underwent successful PCI and stenting of the left anterior descending coronary artery at that time.  Catheterization also confirmed the presence of aortic stenosis and revealed moderately elevated right-sided pressures.  CT angiography was performed and the patient referred for surgical consultation.  Patient is widowed and lives with his adult son in [place].  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  He has remained active and functionally independent.  His mobility has become somewhat limited because of problems with peripheral neuropathy and bilateral foot drop.  He has had multiple mechanical falls and he walks using a cane.  He complains of progressive symptoms of exertional shortness of breath that occur with moderate level activity.  He does not get chest pain or chest tightness with exertion.  He denies any history of resting shortness of breath, PND, orthopnea, or lower extremity edema.  He has not had dizzy spells or syncope.  He has not experienced any change in symptoms since he underwent recent PCI and stenting.  Past Medical History:  Diagnosis Date  . Arthritis   . Bilateral foot-drop 01/18/2017  . Cellulitis of left hand 07/07/2019  . Coronary artery disease involving native coronary artery of native heart without angina pectoris 11/01/2015   Overview:  Bypass surgery in 1996 Overview:  Overview:  Bypass surgery in [place]:  Continue his current regimen.  Follow up as noted.  . CVA (cerebral vascular accident) (  [place]) 04/03/2016   Last Assessment & Plan:  The patient underwent extensive CVA workup.   MRI is negative.  He does have aortic stenosis by echocardiogram.  Upon review of Care everywhere he is followed by Dr. Agustin Cree, cardiologist for this.  Carotid ultrasound negative for hemodynamically significant stenosis.  Lipid panel within normal limits.  He has not had any further episodes of dizziness or nausea vomiting.   . Dementia ([place])   . Depression   . Dizziness 04/19/2016  . DNR (do not resuscitate) 04/03/2016   Overview:  I had an extensive discussion with the patient on 04/03/2016 regarding his end of life wishes.  He and his daughter agree that do not resuscitate is in keeping with his beliefs.  . Dyslipidemia 11/01/2015   Last Assessment & Plan:  Formatting of this note might be different from the original. Cholesterol 136   Triglycerides  83  HDL  50.1  LDL Calculated  69  VLDL Cholesterol Cal  17   Continue statin.  . Essential hypertension 11/01/2015   Last Assessment & Plan:  Resume home medications.  Follow-up as noted above.  Marland Kitchen Herpes zoster 05/19/2019  . Neuropathy   . Other chest pain   . Recurrent falls 06/25/2019  . Severe aortic stenosis   . Sleep apnea   . 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    Past Surgical History:  Procedure Laterality Date  . CARDIAC SURGERY    . CORONARY STENT INTERVENTION N/A 10/07/2019   Procedure: CORONARY STENT INTERVENTION;  Surgeon: Sherren Mocha, MD;  Location: [facility];  Service: Cardiovascular;  Laterality: N/A;  . RIGHT/LEFT HEART CATH AND CORONARY/GRAFT ANGIOGRAPHY N/A 10/07/2019   Procedure: RIGHT/LEFT HEART CATH AND CORONARY/GRAFT ANGIOGRAPHY;  Surgeon: Sherren Mocha, MD;  Location: [facility];  Service: Cardiovascular;  Laterality: N/A;    Family History  Problem Relation Age of Onset  . Cancer Mother   . Diabetes Mother   . Kidney disease Mother   . Stroke Mother   .  Sleep disorder Mother   . Arthritis Father   . Migraines Father   . Hypertension Father   . Sleep disorder Father   . Arthritis Brother   . Cancer Brother     Social History   Socioeconomic History  . Marital status: Widowed    Spouse name: Not on file  . Number of children: 2  . Years of education: Not on file  . Highest education level: Not on file  Occupational History  . Occupation: retired  Tobacco Use  . Smoking status: Former Smoker    Types: Cigarettes, Cigars    Quit date: 05/02/1962    Years since quitting: 57.5  . Smokeless tobacco: Never Used  Substance and Sexual Activity  . Alcohol use: No  . Drug use: No  . Sexual activity: Not Currently  Other Topics Concern  . Not on file  Social History Narrative  . Not on file   Social Determinants of Health   Financial Resource Strain:   . Difficulty of Paying Living Expenses:   Food Insecurity: No Food Insecurity  . Worried About Charity fundraiser in the Last Year: Never true  . Ran Out of Food in the Last Year: Never true  Transportation Needs: No Transportation Needs  . Lack of Transportation (Medical): No  . Lack of  Transportation (Non-Medical): No  Physical Activity:   . Days of Exercise per Week:   . Minutes of Exercise per Session:   Stress:   . Feeling of Stress :   Social Connections:   . Frequency of Communication with Friends and Family:   . Frequency of Social Gatherings with Friends and Family:   . Attends Religious Services:   . Active Member of Clubs or Organizations:   . Attends Archivist Meetings:   Marland Kitchen Marital Status:   Intimate Partner Violence:   . Fear of Current or Ex-Partner:   . Emotionally Abused:   Marland Kitchen Physically Abused:   . Sexually Abused:     Current Outpatient Medications  Medication Sig Dispense Refill  . acetaminophen (TYLENOL) 650 MG CR tablet Take 650 mg by mouth every 8 (eight) hours as needed for pain.     Marland Kitchen amLODipine (NORVASC) 10 MG tablet Take 1 tablet  (10 mg total) by mouth daily. 90 tablet 1  . aspirin EC 81 MG tablet Take 81 mg by mouth daily.    . clopidogrel (PLAVIX) 75 MG tablet Take 1 tablet (75 mg total) by mouth daily with breakfast. 30 tablet 5  . enalapril (VASOTEC) 20 MG tablet Take 1 tablet (20 mg total) by mouth daily. 90 tablet 1  . gabapentin (NEURONTIN) 300 MG capsule Take 300 mg by mouth 2 (two) times daily.    . nitroGLYCERIN (NITROSTAT) 0.4 MG SL tablet Place 1 tablet (0.4 mg total) under the tongue every 5 (five) minutes as needed for chest pain. 25 tablet 2  . pantoprazole (PROTONIX) 40 MG tablet Take 1 tablet (40 mg total) by mouth daily. 30 tablet 2  . potassium chloride (KLOR-CON) 10 MEQ tablet Take 10 mEq by mouth daily.    . rosuvastatin (CRESTOR) 20 MG tablet Take 1 tablet (20 mg total) by mouth at bedtime. (Patient taking differently: Take 20 mg by mouth daily. ) 90 tablet 1  . sertraline (ZOLOFT) 50 MG tablet Take 1 tablet (50 mg total) by mouth in the morning. 90 tablet 1  . trolamine salicylate (BLUE-EMU HEMP) 10 % cream Apply 1 application topically daily as needed for muscle pain (Hands).    . vitamin B-12 (CYANOCOBALAMIN) 500 MCG tablet Take 500 mcg by mouth daily.    . furosemide (LASIX) 40 MG tablet Take 1 tablet (40 mg total) by mouth daily. (Patient not taking: Reported on 10/17/2019) 90 tablet 1   No current facility-administered medications for this visit.    Allergies  Allergen Reactions  . Poison Oak Extract Rash      Review of Systems:   General:  normal appetite, normal energy, no weight gain, no weight loss, no fever  Cardiac:  no chest pain with exertion, no chest pain at rest, + SOB with exertion, no resting SOB, no PND, no orthopnea, no palpitations, no arrhythmia, no atrial fibrillation, no LE edema, no dizzy spells, no syncope  Respiratory:  no shortness of breath, no home oxygen, no productive cough, no dry cough, no bronchitis, no wheezing, no hemoptysis, no asthma, no pain with  inspiration or cough, no sleep apnea, no CPAP at night  GI:   no difficulty swallowing, + reflux, no frequent heartburn, no hiatal hernia, no abdominal pain, no constipation, no diarrhea, no hematochezia, no hematemesis, no melena  GU:   no dysuria,  no frequency, no urinary tract infection, no hematuria, no enlarged prostate, no kidney stones, no kidney disease  Vascular:  no pain  suggestive of claudication, no pain in feet, occasional leg cramps, no varicose veins, no DVT, no non-healing foot ulcer  Neuro:   + possible stroke, no TIA's, no seizures, no headaches, no temporary blindness one eye,  no slurred speech, + peripheral neuropathy, no chronic pain, + instability of gait, mild memory/cognitive dysfunction  Musculoskeletal: + arthritis, no joint swelling, no myalgias, some difficulty walking, limited mobility   Skin:   no rash, no itching, no skin infections, no pressure sores or ulcerations  Psych:   no anxiety, + depression, + nervousness, no unusual recent stress  Eyes:   no blurry vision, no floaters, no recent vision changes, does not wears glasses or contacts  ENT:   + hearing loss, no loose or painful teeth, no dentures, last saw dentist within the past year - needs a filling  Hematologic:  + easy bruising, no abnormal bleeding, no clotting disorder, no frequent epistaxis  Endocrine:  no diabetes, does not check CBG's at home           Physical Exam:   There were no vitals taken for this visit.  General:  Elderly male,  well-appearing  HEENT:  Unremarkable   Neck:   no JVD, no bruits, no adenopathy   Chest:   clear to auscultation, symmetrical breath sounds, no wheezes, no rhonchi   CV:   RRR, grade III/VI crescendo/decrescendo murmur heard best at RSB,  no diastolic murmur  Abdomen:  soft, non-tender, no masses   Extremities:  warm, well-perfused, pulses diminished, no LE edema  Rectal/GU  Deferred  Neuro:   Grossly non-focal and symmetrical throughout  Skin:   Clean and  dry, no rashes, no breakdown   Diagnostic Tests:  ECHOCARDIOGRAM REPORT       Patient Name:  Jack Hodges Date of Exam: 08/03/2019  Medical Rec #: [id number]   Height:    64.0 in  Accession #:  [id number]  Weight:    172.8 lb  Date of Birth: July 03, 1930  BSA:     1.839 m  Patient Age:  80 years   BP:      124/70 mmHg  Patient Gender: M       HR:      70 bpm.  Exam Location: [facility]   Procedure: 2D Echo   Indications:  Aortic Stenosis 424.1 / 135.0    History:    Patient has prior history of Echocardiogram examinations,  most         recent 04/03/2019. CAD, Stroke, Aortic Valve Disease,         Signs/Symptoms:Chest Pain; Risk Factors:Dyslipidemia.    Sonographer:  Luane School  Referring Phys: [place]    1. Left ventricular ejection fraction, by estimation, is 60 to 65%. The  left ventricle has normal function. The left ventricle has no regional  wall motion abnormalities. There is moderate concentric left ventricular  hypertrophy. Left ventricular  diastolic parameters are consistent with Grade II diastolic dysfunction  (pseudonormalization).  2. Right ventricular systolic function is mildly reduced. The right  ventricular size is normal. There is normal pulmonary artery systolic  pressure.  3. Left atrial size was mildly dilated.  4. The mitral valve is normal in structure. Mild mitral valve  regurgitation. No evidence of mitral stenosis.  5. Tricuspid valve regurgitation is mild to moderate.  6. The aortic valve is mildy thickened with moderate calcification.  Aortic valve regurgitation is not visualized. Moderate to severe aortic  valve stenosis.  Aortic valve area, by VTI measures 0.82 cm. Aortic valve  mean gradient measures 31.0 mmHg.  Aortic valve Vmax measures 3.64 m/s.  7. The inferior vena cava is normal in size with greater than 50%  respiratory  variability, suggesting right atrial pressure of 3 mmHg.   Comparison(s): No significant change from prior study done in 04/03/2019.   FINDINGS  Left Ventricle: Left ventricular ejection fraction, by estimation, is 60  to 65%. The left ventricle has normal function. The left ventricle has no  regional wall motion abnormalities. The left ventricular internal cavity  size was normal in size. There is  moderate concentric left ventricular hypertrophy. Left ventricular  diastolic parameters are consistent with Grade II diastolic dysfunction  (pseudonormalization).   Right Ventricle: The right ventricular size is normal. No increase in  right ventricular wall thickness. Right ventricular systolic function is  mildly reduced. There is normal pulmonary artery systolic pressure. The  tricuspid regurgitant velocity is 2.52  m/s, and with an assumed right atrial pressure of 3 mmHg, the estimated  right ventricular systolic pressure is 50.5 mmHg.   Left Atrium: Left atrial size was mildly dilated.   Right Atrium: Right atrial size was normal in size.   Pericardium: There is no evidence of pericardial effusion.   Mitral Valve: The mitral valve is normal in structure. Normal mobility of  the mitral valve leaflets. Mild mitral valve regurgitation. No evidence of  mitral valve stenosis.   Tricuspid Valve: The tricuspid valve is normal in structure. Tricuspid  valve regurgitation is mild to moderate. No evidence of tricuspid  stenosis.   Aortic Valve: The aortic valve is abnormal. . There is moderate thickening  and moderate calcification of the aortic valve. Aortic valve regurgitation  is not visualized. Moderate to severe aortic stenosis is present. Mild  aortic valve annular  calcification. There is moderate thickening of the aortic valve. There is  moderate calcification of the aortic valve. Aortic valve mean gradient  measures 31.0 mmHg. Aortic valve peak gradient measures 53.0 mmHg.  Aortic  valve area, by VTI measures 0.82  cm.   Pulmonic Valve: The pulmonic valve was not well visualized. Pulmonic valve  regurgitation is not visualized. No evidence of pulmonic stenosis.   Aorta: The aortic root is normal in size and structure.   Venous: The inferior vena cava is normal in size with greater than 50%  respiratory variability, suggesting right atrial pressure of 3 mmHg.   IAS/Shunts: No atrial level shunt detected by color flow Doppler.     LEFT VENTRICLE  PLAX 2D  LVIDd:     4.70 cm Diastology  LVIDs:     2.70 cm LV e' lateral:  5.77 cm/s  LV PW:     1.40 cm LV E/e' lateral: 19.2  LV IVS:    1.40 cm LV e' medial:  5.06 cm/s  LVOT diam:   2.10 cm LV E/e' medial: 21.9  LV SV:     75  LV SV Index:  41  LVOT Area:   3.46 cm     RIGHT VENTRICLE      IVC  RV S prime:   7.29 cm/s IVC diam: 1.20 cm  TAPSE (M-mode): 1.5 cm   LEFT ATRIUM       Index    RIGHT ATRIUM      Index  LA Vol (A2C):  71.0 ml 38.62 ml/m RA Area:   17.60 cm  LA Vol (A4C):  76.9 ml 41.83 ml/m RA Volume:  48.40 ml 26.33 ml/m  LA Biplane Vol: 74.5 ml 40.52 ml/m  AORTIC VALVE  AV Area (Vmax):  0.78 cm  AV Area (Vmean):  0.72 cm  AV Area (VTI):   0.82 cm  AV Vmax:      364.00 cm/s  AV Vmean:     257.000 cm/s  AV VTI:      0.913 m  AV Peak Grad:   53.0 mmHg  AV Mean Grad:   31.0 mmHg  LVOT Vmax:     82.00 cm/s  LVOT Vmean:    53.300 cm/s  LVOT VTI:     0.217 m  LVOT/AV VTI ratio: 0.24    AORTA  Ao Root diam: 3.40 cm  Ao Asc diam: 3.80 cm   MITRAL VALVE        TRICUSPID VALVE  MV Area (PHT): 4.06 cm   TR Peak grad:  25.4 mmHg  MV Decel Time: 187 msec   TR Vmax:    252.00 cm/s  MV E velocity: 111.00 cm/s  MV A velocity: 65.00 cm/s  SHUNTS  MV E/A ratio: 1.71     Systemic VTI: 0.22 m               Systemic Diam: 2.10 cm    Jack Tobb DO  Electronically signed by Berniece Salines DO  Signature Date/Time: 08/03/2019/1:07:02 PM       CORONARY STENT INTERVENTION  RIGHT/LEFT HEART CATH AND CORONARY/GRAFT ANGIOGRAPHY  Conclusion  1.  Severe native vessel disease with total occlusion of the LAD, left circumflex, and right coronary arteries. 2.  Status post aortocoronary bypass surgery with continued patency of the saphenous vein graft to diagonal, saphenous vein graft to OM, and sequential saphenous vein graft to PDA and PLA branches 3.  Severe left subclavian artery stenosis proximal to the LIMA with no flow visualized in the LIMA graft 4.  Severe mid LAD stenosis just beyond the diagonal vein graft insertion site, treated successfully with PCI using a 2.5 x 20 mm Synergy DES 5.  Severe aortic stenosis by echo assessment, invasive evaluation demonstrates a mean gradient 31 mmHg, aortic valve area 1.1 cm, calcified and restricted valve difficult to cross with a straight wire  Recommendations: Overnight observation, continue TAVR evaluation, dual antiplatelet therapy with aspirin and clopidogrel at least 6 months (stable angina) Recommendations  Antiplatelet/Anticoag Recommend uninterrupted dual antiplatelet therapy with Aspirin 24m daily and Clopidogrel 747mdaily for a minimum of 6 months (stable ischemic heart disease-Class I recommendation).  Indications  Severe aortic stenosis [I35.0 (ICD-10-CM)]  Procedural Details  Technical Details INDICATION: Severe, stage D1 aortic stenosis.  [age]ear old gentleman with CAD status post remote CABG in 1996 treated with 5 vessel bypass including a LIMA to LAD, sequential saphenous vein to PDA and PLA, saphenous vein to OM, and saphenous vein to first diagonal.  He has developed progressive symptoms with New York Heart Association functional class III limitation of exertional dyspnea which could be related to both his aortic stenosis and progressive coronary disease.  He is  referred for right and left heart catheterization as part of his evaluation.  PROCEDURAL DETAILS: The right groin is prepped, draped, and anesthetized with 1% lidocaine. Using direct ultrasound guidance a 5 French sheath is placed in the right femoral artery and a 7 French sheath is placed in the right femoral vein. USKoreamages are captured and stored in the patient's chart. A Swan-Ganz catheter is used for the right heart catheterization. Standard protocol is followed for recording  of right heart pressures and sampling of oxygen saturations. Fick cardiac output is calculated. Standard Judkins catheters are used for selective coronary angiography and bypass graft angiography.  The left subclavian artery is imaged and shown to be severely stenotic.  The LIMA is not selectively imaged.  The aortic valve was crossed with an AL 2 catheter and a straight wire and pullback pressures were recorded. LV pressure is recorded and an aortic valve pullback gradient is recorded Following the diagnostic procedure, PCI is performed.  Angiomax was used for anticoagulation.  Clopidogrel 600 mg is administered on the table.  The 5 French sheath is changed out for a 6 [place] sheath over a 0.035 inch wire.  PCI is performed using a 6 [place] AL-1 guide catheter to treat severe stenosis in the mid LAD through the saphenous vein graft to first diagonal.  PCI is initially uncomplicated.  There is a single stent inserted.  .  There are no immediate procedural complications.  The femoral arteriotomy is closed with a Perclose device.  The patient is transferred to the post catheterization recovery area for further monitoring.    Estimated blood loss <50 mL.   During this procedure medications were administered to achieve and maintain moderate conscious sedation while the patient's heart rate, blood pressure, and oxygen saturation were continuously monitored and I was present face-to-face 100% of this time.  Medications (Filter:  Administrations occurring from [id number] to 1158 on 10/07/19) (important) Continuous medications are totaled by the amount administered until 10/07/19 1158.  fentaNYL (SUBLIMAZE) injection (mcg) Total dose:  50 mcg Date/Time  Rate/Dose/Volume Action  10/07/19 1010  25 mcg Given  1102  25 mcg Given    midazolam (VERSED) injection (mg) Total dose:  2 mg Date/Time  Rate/Dose/Volume Action  10/07/19 1010  1 mg Given  1102  1 mg Given    lidocaine (PF) (XYLOCAINE) 1 % injection (mL) Total volume:  12 mL Date/Time  Rate/Dose/Volume Action  10/07/19 1017  12 mL Given    Heparin (Porcine) in NaCl 1000-0.9 UT/500ML-% SOLN (mL) Total volume:  1,000 mL Date/Time  Rate/Dose/Volume Action  10/07/19 1017  500 mL Given  1017  500 mL Given    clopidogrel (PLAVIX) tablet (mg) Total dose:  600 mg Date/Time  Rate/Dose/Volume Action  10/07/19 1054  600 mg Given    bivalirudin (ANGIOMAX) BOLUS via infusion (mg/kg) Total dose:  58.875 mg Dosing weight:  78.5 Date/Time  Rate/Dose/Volume Action  10/07/19 1054  58.875 mg Given    bivalirudin (ANGIOMAX) 250 mg in sodium chloride 0.9 % 50 mL (5 mg/mL) infusion (mg/kg/hr) Total dose:  87.39 mg Dosing weight:  78.5 Date/Time  Rate/Dose/Volume Action  10/07/19 1055  1.75 mg/kg/hr - 27.5 mL/hr New Bag/Given  1133   Stopped    nitroGLYCERIN 1 mg/10 mL (100 mcg/mL) - IR/CATH LAB (mcg) Total dose:  150 mcg Date/Time  Rate/Dose/Volume Action  10/07/19 1113  150 mcg Given    iohexol (OMNIPAQUE) 350 MG/ML injection (mL) Total volume:  150 mL Date/Time  Rate/Dose/Volume Action  10/07/19 1140  150 mL Given    Sedation Time  Sedation Time Physician-1: 1 hour 25 minutes 30 seconds  Contrast  Medication Name Total Dose  iohexol (OMNIPAQUE) 350 MG/ML injection 150 mL    Radiation/Fluoro  Fluoro time: 21.1 (min) DAP: 38.4 (mGycm2) Cumulative Air Kerma: 714.8 (mGy)  Coronary Findings  Diagnostic Dominance: Right Left Anterior Descending  Prox  LAD lesion is 100% stenosed.  Mid LAD lesion is 95% stenosed.  There is severe eccentric 95% stenosis in the mid LAD that is seen as it fills from the saphenous vein graft to diagonal.  Left Circumflex  Prox Cx lesion is 100% stenosed.  Right Coronary Artery  Prox RCA lesion is 100% stenosed.  Sequential Jump Graft Graft To 1st RPL, RPDA  Seq SVG- PDA/PLA. The sequential saphenous vein graft to the PDA and PLA branches is patent  LIMA LIMA Graft To Mid LAD  LIMA. The LIMA to LAD is atretic. There is no flow into the vessel likely because of severe proximal left subclavian artery stenosis  Saphenous Graft To 1st Diag  SVG. The saphenous vein graft to first diagonal is patent without significant stenosis. The anastomotic insertion is at the diagonal/LAD junction.  Saphenous Graft To 1st Mrg  SVG. The saphenous vein graft obtuse marginal branch is patent without significant stenosis  Intervention  Mid LAD lesion  Stent  CATHETER LAUNCHER 6FR AL1 guide catheter was inserted. Lesion crossed with guidewire using a WIRE HI TORQ WHISPER MS 190CM. Pre-stent angioplasty was performed using a BALLOON SAPPHIRE 2.0X15. Maximum pressure: 8 atm. A drug-eluting stent was successfully placed using a SYNERGY XD 2.50X20. Post-stent angioplasty was performed using a BALLOON SAPPHIRE [place] [id number]. Maximum pressure: 18 atm. PCI is performed through the diagonal vein graft into the mid LAD. The lesion is difficult to wire, but ultimately is wired with a whisper wire. The lesion is predilated with a 2.0 mm balloon, stented with a 2.5 x 20 mm Synergy DES, and postdilated with a 2.75 mm noncompliant balloon to 18 atm.  Post-Intervention Lesion Assessment  The intervention was successful. Pre-interventional TIMI flow is 3. Post-intervention TIMI flow is 3. No complications occurred at this lesion.  There is a 0% residual stenosis post intervention.  Left Heart  Aortic Valve The aortic valve is calcified. There is  restricted aortic valve motion. Mean transaortic gradient 31 mmHg, calculated valve area 1.1 cm  Coronary Diagrams  Diagnostic Dominance: Right  Intervention   Implants   Permanent Stent  Synergy Xd 2.50x20 - [id number] - Implanted Inventory item: SYNERGY XD 2.50X20 Model/Cat number: [id number]  Manufacturer: Wilkie Aye Lot number: [id number]  Device identifier: [id number] Device identifier type: GS1  GUDID Information  Request status Request failed - device not in GUDID    As of 10/07/2019  Status: Implanted      Syngo Images  Show images for CARDIAC CATHETERIZATION Images on Long Term Storage  Show images for Prescher, Avram W Link to Procedure Log  Procedure Log    Hemo Data   Most Recent Value  Fick Cardiac Output 5.74 L/min  Fick Cardiac Output Index 3.04 (L/min)/BSA  RA A Wave 17 mmHg  RA V Wave 17 mmHg  RA Mean 14 mmHg  RV Systolic Pressure 55 mmHg  RV Diastolic Pressure 8 mmHg  RV EDP 17 mmHg  PA Systolic Pressure 53 mmHg  PA Diastolic Pressure 20 mmHg  PA Mean 31 mmHg  PW A Wave 24 mmHg  PW V Wave 27 mmHg  PW Mean 20 mmHg  AO Systolic Pressure 151 mmHg  AO Diastolic Pressure 57 mmHg  AO Mean 92 mmHg  LV Systolic Pressure 761 mmHg  LV Diastolic Pressure 18 mmHg  LV EDP 30 mmHg  AOp Systolic Pressure 607 mmHg  AOp Diastolic Pressure 59 mmHg  AOp Mean Pressure 92 mmHg  LVp Systolic Pressure 371 mmHg  LVp Diastolic Pressure 22 mmHg  LVp EDP Pressure 34 mmHg  QP/QS 1  TPVR  Index 10.19 HRUI  TSVR Index 30.23 HRUI  PVR SVR Ratio 0.14  TPVR/TSVR Ratio 0.34       Cardiac TAVR CT  TECHNIQUE: The patient was scanned on a [employer]. A 120 kV retrospective scan was triggered in the descending thoracic aorta at 111 HU's. Gantry rotation speed was 250 msecs and collimation was .6 mm. No beta blockade or nitro were given. The 3D data set was reconstructed in 5% intervals of the R-R cycle. Systolic and diastolic phases were  analyzed on a dedicated work station using MPR, MIP and VRT modes. The patient received 80 cc of contrast.  FINDINGS: Image quality: Excellent.  Noise artifact is: Limited.  Valve Morphology: The aortic valve is tricuspid. The valve is severely calcified, and severely thickened, with restricted leaflet motion in systole. There are severe bulky calcifications noted on the [place].  Aortic Valve Calcium score: 1625  Aortic annular dimension:  Phase assessed: 15%  Annular area: 492 mm2  Annular perimeter: 79.1 mm  Max diameter: 26.6 mm  Min diameter: 23.9 mm  Annular and subannular calcification: There is no annular or subannular calcifications.  Optimal coplanar projection: LAO 12 CRA 1  Coronary Artery Height above Annulus:  Left Main: 11.9 mm  Right Coronary: 17.2 mm  Sinus of Valsalva Measurements:  Non-coronary: 32 mm  Right-coronary: 31 mm  Left-coronary: 32 mm  Sinus of Valsalva Height:  Non-coronary: 21.7 mm  Right-coronary: 23.5 mm  Left-coronary: 20.9 mm  Sinotubular Junction: 26 mm  Ascending Thoracic Aorta: 33 mm  Coronary Arteries: Normal coronary origin. Right dominance. The study was performed without use of NTG and is insufficient for plaque evaluation. Severe 3-vessel native CAD. There are 3 patent vein grafts observed to a diagonal branch, obtuse marginal branch, and PDA. A LIMA graft is reported but not observed (reported to be atretic). Please refer to recent cardiac catheterization for coronary assessment.  Cardiac Morphology:  Right Atrium: Right atrial size is within normal limits.  Right Ventricle: The right ventricular cavity is within normal limits.  Left Atrium: Left atrial size is normal in size with no left atrial appendage filling defect.  Left Ventricle: The ventricular cavity size is within normal limits. There are no stigmata of prior infarction. There is no abnormal filling defect.  LVEF=74%. Septal motion consistent with postoperative state.  Pulmonary arteries: Normal in size without proximal filling defect.  Pulmonary veins: Normal pulmonary venous drainage.  Pericardium: Normal thickness with no significant effusion or calcium present.  Mitral Valve: The mitral valve is normal structure without significant calcification.  Extra-cardiac findings: See attached radiology report for non-cardiac structures.  IMPRESSION: 1. Annular measurements appropriate for 26 mm S3 (492 mm2).  2. No significant annular or subannular calcifications.  3. Sufficient coronary to annulus distance.  4. Optimal Fluoroscopic Angle for Delivery: LAO 12 CRA 1  Jack T. Audie Box, MD   Electronically Signed   By: Eleonore Chiquito   On: 10/12/2019 13:31   CT ANGIOGRAPHY CHEST, ABDOMEN AND PELVIS  TECHNIQUE: Multidetector CT imaging through the chest, abdomen and pelvis was performed using the standard protocol during bolus administration of intravenous contrast. Multiplanar reconstructed images and MIPs were obtained and reviewed to evaluate the vascular anatomy.  CONTRAST:  76m OMNIPAQUE IOHEXOL 350 MG/ML SOLN  COMPARISON:  No prior chest CT. CT of the abdomen and pelvis 06/17/2019.  FINDINGS: CTA CHEST FINDINGS  Cardiovascular: Heart size is normal. There is no significant pericardial fluid, thickening or pericardial calcification. There is aortic  atherosclerosis, as well as atherosclerosis of the great vessels of the mediastinum and the coronary arteries, including calcified atherosclerotic plaque in the left main, left anterior descending, left circumflex and right coronary arteries. Status post median sternotomy for CABG including LIMA to the LAD. Severe thickening calcification of the aortic valve.  Mediastinum/Lymph Nodes: No pathologically enlarged mediastinal or hilar lymph nodes. Esophagus is unremarkable in appearance. No axillary  lymphadenopathy.  Lungs/Pleura: No suspicious appearing pulmonary nodules or masses are noted. No acute consolidative airspace disease. No pleural effusions.  Musculoskeletal/Soft Tissues: Median sternotomy wires. There are no aggressive appearing lytic or blastic lesions noted in the visualized portions of the skeleton.  CTA ABDOMEN AND PELVIS FINDINGS  Hepatobiliary: No suspicious cystic or solid hepatic lesions. No intra or extrahepatic biliary ductal dilatation. 2 cm gallstone lying dependently in the gallbladder. Gallbladder is otherwise unremarkable in appearance.  Pancreas: No pancreatic mass. No pancreatic ductal dilatation. No pancreatic or peripancreatic fluid collections or inflammatory changes.  Spleen: Unremarkable.  Adrenals/Urinary Tract: Bilateral kidneys and bilateral adrenal glands are normal in appearance. No hydroureteronephrosis. Urinary bladder is normal in appearance.  Stomach/Bowel: Normal appearance of the stomach. No pathologic dilatation of small bowel or Jack Hodges. Numerous colonic diverticulae are noted, particularly in the sigmoid Jack Hodges, without surrounding inflammatory changes to suggest an acute diverticulitis at this time. Normal appendix.  Vascular/Lymphatic: Aortic atherosclerosis, without evidence of aneurysm or dissection in the abdominal or pelvic vasculature. No lymphadenopathy noted in the abdomen or pelvis.  Reproductive: Prostate gland and seminal vesicles are unremarkable in appearance.  Other: No significant volume of ascites.  No pneumoperitoneum.  Musculoskeletal: There are no aggressive appearing lytic or blastic lesions noted in the visualized portions of the skeleton.  VASCULAR MEASUREMENTS PERTINENT TO TAVR:  AORTA:  Minimal Aortic Diameter-13 x 12 mm  Severity of Aortic Calcification-severe  RIGHT PELVIS:  Right Common Iliac Artery -  Minimal Diameter-8.8 x 7.8  mm  Tortuosity-mild  Calcification-moderate to severe  Right External Iliac Artery -  Minimal Diameter-7.0 x 6.3 mm  Tortuosity-moderate  Calcification-mild  Right Common Femoral Artery -  Minimal Diameter-6.8 x 6.5 mm  Tortuosity-mild  Calcification-moderate  LEFT PELVIS:  Left Common Iliac Artery -  Minimal Diameter-9.6 x 8.0 mm  Tortuosity-mild  Calcification-mild  Left External Iliac Artery -  Minimal Diameter-6.8 x 6.3 mm  Tortuosity-moderate  Calcification-mild  Left Common Femoral Artery -  Minimal Diameter-5.4 x 2.7 mm  Tortuosity-mild  Calcification-moderate  Review of the MIP images confirms the above findings.  IMPRESSION: 1. Vascular findings and measurements pertinent to potential TAVR procedure, as detailed above. 2. Severe thickening calcification of the aortic valve, compatible with reported clinical history of severe aortic stenosis. 3. Aortic atherosclerosis, in addition to left main and 3 vessel coronary artery disease. Status post median sternotomy for CABG including LIMA to the LAD. 4. Colonic diverticulosis without evidence of acute diverticulitis at this time. 5. Cholelithiasis without evidence of acute cholecystitis.   Electronically Signed   By: Jack Hodges M.D.   On: 10/12/2019 12:25     EKG: NSR with baseline RBBB (10/09/2019)      Impression:  Patient has stage D1 severe symptomatic aortic stenosis.  He describes progressive symptoms of exertional shortness of breath consistent with chronic diastolic congestive heart failure, New [place] Heart Association functional class II.  I have personally reviewed the patient's recent transthoracic echocardiogram, diagnostic cardiac catheterization, and CT angiograms.  Echocardiogram performed Aug 03, 2019 reveals findings consistent with severe aortic stenosis and normal  left ventricular function.  Ejection fraction is estimated 60 to 65%.   The aortic valve is trileaflet with moderate to severe leaflet thickening and calcification.  There is restricted leaflet mobility involving all 3 leaflets.  Peak velocity across the aortic valve measured greater than 3.6 m/s corresponding to mean transvalvular gradient estimated 31 mmHg and aortic valve area calculated 0.82 cm.  The DVI was reported 0.24.  Diagnostic cardiac catheterization confirmed the presence of aortic stenosis and revealed severe native coronary artery disease with chronic occlusion of the left anterior descending coronary artery, left circumflex coronary artery, and the right coronary artery.  There remained continued patency of 4 out of 5 previously placed bypass grafts including graft to all 3 major vascular territories.  There was no significant vein graft disease but there was significant disease in the distal left anterior descending coronary artery beyond insertion of the patent vein graft.  This was successfully treated with PCI and stenting.  Right heart catheterization revealed moderately elevated right-sided pressures.  I agree the patient would likely benefit from aortic valve replacement.  However, I would not consider this elderly gentleman a candidate for aortic valve replacement using conventional surgical techniques under any circumstances because of his advanced age, previous coronary artery bypass surgery, and somewhat limited physical mobility.  Cardiac-gated CTA of the heart reveals anatomical characteristics consistent with aortic stenosis suitable for treatment by transcatheter aortic valve replacement without any significant complicating features and CTA of the aorta and iliac vessels demonstrate what appears to be adequate pelvic vascular access to facilitate a transfemoral approach.  EKG does reveal sinus rhythm with baseline right bundle branch block.    Plan:  The patient was counseled at length regarding treatment alternatives for management of severe  symptomatic aortic stenosis. Alternative approaches such as conventional aortic valve replacement, transcatheter aortic valve replacement, and continued medical therapy without intervention were compared and contrasted at length.  The risks associated with conventional surgical aortic valve replacement were discussed in detail, as were expectations for post-operative convalescence, and why I would be reluctant to consider this patient a candidate for conventional surgery.  Issues specific to transcatheter aortic valve replacement were discussed including questions about long term valve durability, the potential for paravalvular leak, possible increased risk of need for permanent pacemaker placement, and other technical complications related to the procedure itself.  Long-term prognosis with medical therapy was discussed. This discussion was placed in the context of the patient's own specific clinical presentation and past medical history.  All of their questions have been addressed.  The patient desires to proceed with transcatheter aortic valve replacement soon as possible.  We tentatively plan for surgery on October 27, 2019.  Following the decision to proceed with transcatheter aortic valve replacement, a discussion has been held regarding what types of management strategies would be attempted intraoperatively in the event of life-threatening complications, including whether or not the patient would be considered a candidate for the use of cardiopulmonary bypass and/or conversion to open sternotomy for attempted surgical intervention.  The patient specifically requests that should a potentially life-threatening complication develop we would not attempt emergency median sternotomy and/or other aggressive surgical procedures.  The patient has been advised of a variety of complications that might develop including but not limited to risks of death, stroke, paravalvular leak, aortic dissection or other major vascular  complications, aortic annulus rupture, device embolization, cardiac rupture or perforation, mitral regurgitation, acute myocardial infarction, arrhythmia, heart block or bradycardia requiring permanent pacemaker placement, congestive  heart failure, respiratory failure, renal failure, pneumonia, infection, other late complications related to structural valve deterioration or migration, or other complications that might ultimately cause a temporary or permanent loss of functional independence or other long term morbidity.  The patient provides full informed consent for the procedure as described and all questions were answered.    I spent in excess of 90 minutes during the conduct of this office consultation and >50% of this time involved direct face-to-face encounter with the patient for counseling and/or coordination of their care.   Jack Gu. Roxy Manns, MD 10/20/2019 3:51 PM

## 2019-10-20 NOTE — Progress Notes (Signed)
[facility] CONSULTATION REPORT  Referring Provider is Park Liter, MD PCP is Lillard Anes, MD  Chief Complaint  Patient presents with  . Aortic Stenosis    new patient consultation, review all studies    HPI:  Patient is an 86 year old male with history of coronary artery disease status post coronary artery bypass grafting in 1996 and recently status post PCI and stenting, aortic stenosis, hypertension, peripheral arterial disease with left subclavian stenosis, remote history of stroke, hyperlipidemia, peripheral neuropathy with bilateral foot drop, and mild dementia who has been referred for surgical consultation to discuss treatment options for management of severe symptomatic aortic stenosis.  Patient's cardiac history dates back to 1996 when he presented with symptomatic coronary artery disease.  He underwent coronary artery bypass grafting x5 and did very well.  He has developed aortic stenosis that gradually progressed in severity on follow-up imaging.  In March 2021 he was hospitalized with atypical chest pain that was felt likely related to shingles.  He recently underwent follow-up echocardiogram that revealed significant progression and severity of aortic stenosis with preserved left ventricular systolic function.  He was seen in follow-up by Dr. Agustin Cree and referred to the multidisciplinary heart valve clinic where he was evaluated by Dr. Angelena Form.  He subsequently underwent diagnostic cardiac catheterization on October 07, 2019.  He was noted to have severe native coronary artery disease with chronic total occlusion of the left anterior descending coronary artery, left circumflex coronary artery, and the right coronary arteries.  He had continued patency of the greater saphenous vein grafts to the diagonal branch which filled the left anterior descending coronary artery, the obtuse marginal  branch, and sequential saphenous vein grafts to both the posterior descending and the posterior lateral branches.  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  There was significant high-grade stenosis in the left anterior descending coronary artery just beyond the diagonal vein graft insertion site.  He underwent successful PCI and stenting of the left anterior descending coronary artery at that time.  Catheterization also confirmed the presence of aortic stenosis and revealed moderately elevated right-sided pressures.  CT angiography was performed and the patient referred for surgical consultation.  Patient is widowed and lives with his adult son in [place].  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  He has remained active and functionally independent.  His mobility has become somewhat limited because of problems with peripheral neuropathy and bilateral foot drop.  He has had multiple mechanical falls and he walks using a cane.  He complains of progressive symptoms of exertional shortness of breath that occur with moderate level activity.  He does not get chest pain or chest tightness with exertion.  He denies any history of resting shortness of breath, PND, orthopnea, or lower extremity edema.  He has not had dizzy spells or syncope.  He has not experienced any change in symptoms since he underwent recent PCI and stenting.  Past Medical History:  Diagnosis Date  . Arthritis   . Bilateral foot-drop 01/18/2017  . Cellulitis of left hand 07/07/2019  . Coronary artery disease involving native coronary artery of native heart without angina pectoris 11/01/2015   Overview:  Bypass surgery in 1996 Overview:  Overview:  Bypass surgery in [place]:  Continue his current regimen.  Follow up as noted.  . CVA (cerebral vascular accident) (  [place]) 04/03/2016   Last Assessment & Plan:  The patient underwent extensive CVA workup.   MRI is negative.  He does have aortic stenosis by echocardiogram.  Upon review of Care everywhere he is followed by Dr. Agustin Cree, cardiologist for this.  Carotid ultrasound negative for hemodynamically significant stenosis.  Lipid panel within normal limits.  He has not had any further episodes of dizziness or nausea vomiting.   . Dementia ([place])   . Depression   . Dizziness 04/19/2016  . DNR (do not resuscitate) 04/03/2016   Overview:  I had an extensive discussion with the patient on 04/03/2016 regarding his end of life wishes.  He and his daughter agree that do not resuscitate is in keeping with his beliefs.  . Dyslipidemia 11/01/2015   Last Assessment & Plan:  Formatting of this note might be different from the original. Cholesterol 136   Triglycerides  83  HDL  50.1  LDL Calculated  69  VLDL Cholesterol Cal  17   Continue statin.  . Essential hypertension 11/01/2015   Last Assessment & Plan:  Resume home medications.  Follow-up as noted above.  Marland Kitchen Herpes zoster 05/19/2019  . Neuropathy   . Other chest pain   . Recurrent falls 06/25/2019  . Severe aortic stenosis   . Sleep apnea   . 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    Past Surgical History:  Procedure Laterality Date  . CARDIAC SURGERY    . CORONARY STENT INTERVENTION N/A 10/07/2019   Procedure: CORONARY STENT INTERVENTION;  Surgeon: Sherren Mocha, MD;  Location: [facility];  Service: Cardiovascular;  Laterality: N/A;  . RIGHT/LEFT HEART CATH AND CORONARY/GRAFT ANGIOGRAPHY N/A 10/07/2019   Procedure: RIGHT/LEFT HEART CATH AND CORONARY/GRAFT ANGIOGRAPHY;  Surgeon: Sherren Mocha, MD;  Location: [facility];  Service: Cardiovascular;  Laterality: N/A;    Family History  Problem Relation Age of Onset  . Cancer Mother   . Diabetes Mother   . Kidney disease Mother   . Stroke Mother   .  Sleep disorder Mother   . Arthritis Father   . Migraines Father   . Hypertension Father   . Sleep disorder Father   . Arthritis Brother   . Cancer Brother     Social History   Socioeconomic History  . Marital status: Widowed    Spouse name: Not on file  . Number of children: 2  . Years of education: Not on file  . Highest education level: Not on file  Occupational History  . Occupation: retired  Tobacco Use  . Smoking status: Former Smoker    Types: Cigarettes, Cigars    Quit date: 05/02/1962    Years since quitting: 57.5  . Smokeless tobacco: Never Used  Substance and Sexual Activity  . Alcohol use: No  . Drug use: No  . Sexual activity: Not Currently  Other Topics Concern  . Not on file  Social History Narrative  . Not on file   Social Determinants of Health   Financial Resource Strain:   . Difficulty of Paying Living Expenses:   Food Insecurity: No Food Insecurity  . Worried About Charity fundraiser in the Last Year: Never true  . Ran Out of Food in the Last Year: Never true  Transportation Needs: No Transportation Needs  . Lack of Transportation (Medical): No  . Lack of  Transportation (Non-Medical): No  Physical Activity:   . Days of Exercise per Week:   . Minutes of Exercise per Session:   Stress:   . Feeling of Stress :   Social Connections:   . Frequency of Communication with Friends and Family:   . Frequency of Social Gatherings with Friends and Family:   . Attends Religious Services:   . Active Member of Clubs or Organizations:   . Attends Archivist Meetings:   Marland Kitchen Marital Status:   Intimate Partner Violence:   . Fear of Current or Ex-Partner:   . Emotionally Abused:   Marland Kitchen Physically Abused:   . Sexually Abused:     Current Outpatient Medications  Medication Sig Dispense Refill  . acetaminophen (TYLENOL) 650 MG CR tablet Take 650 mg by mouth every 8 (eight) hours as needed for pain.     Marland Kitchen amLODipine (NORVASC) 10 MG tablet Take 1 tablet  (10 mg total) by mouth daily. 90 tablet 1  . aspirin EC 81 MG tablet Take 81 mg by mouth daily.    . clopidogrel (PLAVIX) 75 MG tablet Take 1 tablet (75 mg total) by mouth daily with breakfast. 30 tablet 5  . enalapril (VASOTEC) 20 MG tablet Take 1 tablet (20 mg total) by mouth daily. 90 tablet 1  . gabapentin (NEURONTIN) 300 MG capsule Take 300 mg by mouth 2 (two) times daily.    . nitroGLYCERIN (NITROSTAT) 0.4 MG SL tablet Place 1 tablet (0.4 mg total) under the tongue every 5 (five) minutes as needed for chest pain. 25 tablet 2  . pantoprazole (PROTONIX) 40 MG tablet Take 1 tablet (40 mg total) by mouth daily. 30 tablet 2  . potassium chloride (KLOR-CON) 10 MEQ tablet Take 10 mEq by mouth daily.    . rosuvastatin (CRESTOR) 20 MG tablet Take 1 tablet (20 mg total) by mouth at bedtime. (Patient taking differently: Take 20 mg by mouth daily. ) 90 tablet 1  . sertraline (ZOLOFT) 50 MG tablet Take 1 tablet (50 mg total) by mouth in the morning. 90 tablet 1  . trolamine salicylate (BLUE-EMU HEMP) 10 % cream Apply 1 application topically daily as needed for muscle pain (Hands).    . vitamin B-12 (CYANOCOBALAMIN) 500 MCG tablet Take 500 mcg by mouth daily.    . furosemide (LASIX) 40 MG tablet Take 1 tablet (40 mg total) by mouth daily. (Patient not taking: Reported on 10/17/2019) 90 tablet 1   No current facility-administered medications for this visit.    Allergies  Allergen Reactions  . Poison Oak Extract Rash      Review of Systems:   General:  normal appetite, normal energy, no weight gain, no weight loss, no fever  Cardiac:  no chest pain with exertion, no chest pain at rest, + SOB with exertion, no resting SOB, no PND, no orthopnea, no palpitations, no arrhythmia, no atrial fibrillation, no LE edema, no dizzy spells, no syncope  Respiratory:  no shortness of breath, no home oxygen, no productive cough, no dry cough, no bronchitis, no wheezing, no hemoptysis, no asthma, no pain with  inspiration or cough, no sleep apnea, no CPAP at night  GI:   no difficulty swallowing, + reflux, no frequent heartburn, no hiatal hernia, no abdominal pain, no constipation, no diarrhea, no hematochezia, no hematemesis, no melena  GU:   no dysuria,  no frequency, no urinary tract infection, no hematuria, no enlarged prostate, no kidney stones, no kidney disease  Vascular:  no pain  suggestive of claudication, no pain in feet, occasional leg cramps, no varicose veins, no DVT, no non-healing foot ulcer  Neuro:   + possible stroke, no TIA's, no seizures, no headaches, no temporary blindness one eye,  no slurred speech, + peripheral neuropathy, no chronic pain, + instability of gait, mild memory/cognitive dysfunction  Musculoskeletal: + arthritis, no joint swelling, no myalgias, some difficulty walking, limited mobility   Skin:   no rash, no itching, no skin infections, no pressure sores or ulcerations  Psych:   no anxiety, + depression, + nervousness, no unusual recent stress  Eyes:   no blurry vision, no floaters, no recent vision changes, does not wears glasses or contacts  ENT:   + hearing loss, no loose or painful teeth, no dentures, last saw dentist within the past year - needs a filling  Hematologic:  + easy bruising, no abnormal bleeding, no clotting disorder, no frequent epistaxis  Endocrine:  no diabetes, does not check CBG's at home           Physical Exam:   There were no vitals taken for this visit.  General:  Elderly male,  well-appearing  HEENT:  Unremarkable   Neck:   no JVD, no bruits, no adenopathy   Chest:   clear to auscultation, symmetrical breath sounds, no wheezes, no rhonchi   CV:   RRR, grade III/VI crescendo/decrescendo murmur heard best at RSB,  no diastolic murmur  Abdomen:  soft, non-tender, no masses   Extremities:  warm, well-perfused, pulses diminished, no LE edema  Rectal/GU  Deferred  Neuro:   Grossly non-focal and symmetrical throughout  Skin:   Clean and  dry, no rashes, no breakdown   Diagnostic Tests:  ECHOCARDIOGRAM REPORT       Patient Name:  Magn[place] Date of Exam: 08/03/2019  Medical Rec #: [id number]   Height:    64.0 in  Accession #:  [id number]  Weight:    172.8 lb  Date of Birth: [birth year]  BSA:     1.839 m  Patient Age:  12 years   BP:      124/70 mmHg  Patient Gender: M       HR:      70 bpm.  Exam Location: [facility]   Procedure: 2D Echo   Indications:  Aortic Stenosis 424.1 / 135.0    History:    Patient has prior history of Echocardiogram examinations,  most         recent 04/03/2019. CAD, Stroke, Aortic Valve Disease,         Signs/Symptoms:Chest Pain; Risk Factors:Dyslipidemia.    Sonographer:  Luane School  Referring Phys: Freder[place]    1. Left ventricular ejection fraction, by estimation, is 60 to 65%. The  left ventricle has normal function. The left ventricle has no regional  wall motion abnormalities. There is moderate concentric left ventricular  hypertrophy. Left ventricular  diastolic parameters are consistent with Grade II diastolic dysfunction  (pseudonormalization).  2. Right ventricular systolic function is mildly reduced. The right  ventricular size is normal. There is normal pulmonary artery systolic  pressure.  3. Left atrial size was mildly dilated.  4. The mitral valve is normal in structure. Mild mitral valve  regurgitation. No evidence of mitral stenosis.  5. Tricuspid valve regurgitation is mild to moderate.  6. The aortic valve is mildy thickened with moderate calcification.  Aortic valve regurgitation is not visualized. Moderate to severe aortic  valve stenosis.  Aortic valve area, by VTI measures 0.82 cm. Aortic valve  mean gradient measures 31.0 mmHg.  Aortic valve Vmax measures 3.64 m/s.  7. The inferior vena cava is normal in size with greater than 50%  respiratory  variability, suggesting right atrial pressure of 3 mmHg.   Comparison(s): No significant change from prior study done in 04/03/2019.   FINDINGS  Left Ventricle: Left ventricular ejection fraction, by estimation, is 60  to 65%. The left ventricle has normal function. The left ventricle has no  regional wall motion abnormalities. The left ventricular internal cavity  size was normal in size. There is  moderate concentric left ventricular hypertrophy. Left ventricular  diastolic parameters are consistent with Grade II diastolic dysfunction  (pseudonormalization).   Right Ventricle: The right ventricular size is normal. No increase in  right ventricular wall thickness. Right ventricular systolic function is  mildly reduced. There is normal pulmonary artery systolic pressure. The  tricuspid regurgitant velocity is 2.52  m/s, and with an assumed right atrial pressure of 3 mmHg, the estimated  right ventricular systolic pressure is 76.5 mmHg.   Left Atrium: Left atrial size was mildly dilated.   Right Atrium: Right atrial size was normal in size.   Pericardium: There is no evidence of pericardial effusion.   Mitral Valve: The mitral valve is normal in structure. Normal mobility of  the mitral valve leaflets. Mild mitral valve regurgitation. No evidence of  mitral valve stenosis.   Tricuspid Valve: The tricuspid valve is normal in structure. Tricuspid  valve regurgitation is mild to moderate. No evidence of tricuspid  stenosis.   Aortic Valve: The aortic valve is abnormal. . There is moderate thickening  and moderate calcification of the aortic valve. Aortic valve regurgitation  is not visualized. Moderate to severe aortic stenosis is present. Mild  aortic valve annular  calcification. There is moderate thickening of the aortic valve. There is  moderate calcification of the aortic valve. Aortic valve mean gradient  measures 31.0 mmHg. Aortic valve peak gradient measures 53.0 mmHg.  Aortic  valve area, by VTI measures 0.82  cm.   Pulmonic Valve: The pulmonic valve was not well visualized. Pulmonic valve  regurgitation is not visualized. No evidence of pulmonic stenosis.   Aorta: The aortic root is normal in size and structure.   Venous: The inferior vena cava is normal in size with greater than 50%  respiratory variability, suggesting right atrial pressure of 3 mmHg.   IAS/Shunts: No atrial level shunt detected by color flow Doppler.     LEFT VENTRICLE  PLAX 2D  LVIDd:     4.70 cm Diastology  LVIDs:     2.70 cm LV e' lateral:  5.77 cm/s  LV PW:     1.40 cm LV E/e' lateral: 19.2  LV IVS:    1.40 cm LV e' medial:  5.06 cm/s  LVOT diam:   2.10 cm LV E/e' medial: 21.9  LV SV:     75  LV SV Index:  41  LVOT Area:   3.46 cm     RIGHT VENTRICLE      IVC  RV S prime:   7.29 cm/s IVC diam: 1.20 cm  TAPSE (M-mode): 1.5 cm   LEFT ATRIUM       Index    RIGHT ATRIUM      Index  LA Vol (A2C):  71.0 ml 38.62 ml/m RA Area:   17.60 cm  LA Vol (A4C):  76.9 ml 41.83 ml/m RA Volume:  48.40 ml 26.33 ml/m  LA Biplane Vol: 74.5 ml 40.52 ml/m  AORTIC VALVE  AV Area (Vmax):  0.78 cm  AV Area (Vmean):  0.72 cm  AV Area (VTI):   0.82 cm  AV Vmax:      364.00 cm/s  AV Vmean:     257.000 cm/s  AV VTI:      0.913 m  AV Peak Grad:   53.0 mmHg  AV Mean Grad:   31.0 mmHg  LVOT Vmax:     82.00 cm/s  LVOT Vmean:    53.300 cm/s  LVOT VTI:     0.217 m  LVOT/AV VTI ratio: 0.24    AORTA  Ao Root diam: 3.40 cm  Ao Asc diam: 3.80 cm   MITRAL VALVE        TRICUSPID VALVE  MV Area (PHT): 4.06 cm   TR Peak grad:  25.4 mmHg  MV Decel Time: 187 msec   TR Vmax:    252.00 cm/s  MV E velocity: 111.00 cm/s  MV A velocity: 65.00 cm/s  SHUNTS  MV E/A ratio: 1.71     Systemic VTI: 0.22 m               Systemic Diam: 2.10 cm    Kardie Tobb DO  Electronically signed by Berniece Salines DO  Signature Date/Time: 08/03/2019/1:07:02 PM       CORONARY STENT INTERVENTION  RIGHT/LEFT HEART CATH AND CORONARY/GRAFT ANGIOGRAPHY  Conclusion  1.  Severe native vessel disease with total occlusion of the LAD, left circumflex, and right coronary arteries. 2.  Status post aortocoronary bypass surgery with continued patency of the saphenous vein graft to diagonal, saphenous vein graft to OM, and sequential saphenous vein graft to PDA and PLA branches 3.  Severe left subclavian artery stenosis proximal to the LIMA with no flow visualized in the LIMA graft 4.  Severe mid LAD stenosis just beyond the diagonal vein graft insertion site, treated successfully with PCI using a 2.5 x 20 mm Synergy DES 5.  Severe aortic stenosis by echo assessment, invasive evaluation demonstrates a mean gradient 31 mmHg, aortic valve area 1.1 cm, calcified and restricted valve difficult to cross with a straight wire  Recommendations: Overnight observation, continue TAVR evaluation, dual antiplatelet therapy with aspirin and clopidogrel at least 6 months (stable angina) Recommendations  Antiplatelet/Anticoag Recommend uninterrupted dual antiplatelet therapy with Aspirin 20m daily and Clopidogrel 730mdaily for a minimum of 6 months (stable ischemic heart disease-Class I recommendation).  Indications  Severe aortic stenosis [I35.0 (ICD-10-CM)]  Procedural Details  Technical Details INDICATION: Severe, stage D1 aortic stenosis.  [age]ear old gentleman with CAD status post remote CABG in 1996 treated with 5 vessel bypass including a LIMA to LAD, sequential saphenous vein to PDA and PLA, saphenous vein to OM, and saphenous vein to first diagonal.  He has developed progressive symptoms with New York Heart Association functional class III limitation of exertional dyspnea which could be related to both his aortic stenosis and progressive coronary disease.  He is  referred for right and left heart catheterization as part of his evaluation.  PROCEDURAL DETAILS: The right groin is prepped, draped, and anesthetized with 1% lidocaine. Using direct ultrasound guidance a 5 French sheath is placed in the right femoral artery and a 7 French sheath is placed in the right femoral vein. USKoreamages are captured and stored in the patient's chart. A Swan-Ganz catheter is used for the right heart catheterization. Standard protocol is followed for recording  of right heart pressures and sampling of oxygen saturations. Fick cardiac output is calculated. Standard Judkins catheters are used for selective coronary angiography and bypass graft angiography.  The left subclavian artery is imaged and shown to be severely stenotic.  The LIMA is not selectively imaged.  The aortic valve was crossed with an AL 2 catheter and a straight wire and pullback pressures were recorded. LV pressure is recorded and an aortic valve pullback gradient is recorded Following the diagnostic procedure, PCI is performed.  Angiomax was used for anticoagulation.  Clopidogrel 600 mg is administered on the table.  The 5 French sheath is changed out for a 6 [place] sheath over a 0.035 inch wire.  PCI is performed using a 6 [place] AL-1 guide catheter to treat severe stenosis in the mid LAD through the saphenous vein graft to first diagonal.  PCI is initially uncomplicated.  There is a single stent inserted.  .  There are no immediate procedural complications.  The femoral arteriotomy is closed with a Perclose device.  The patient is transferred to the post catheterization recovery area for further monitoring.    Estimated blood loss <50 mL.   During this procedure medications were administered to achieve and maintain moderate conscious sedation while the patient's heart rate, blood pressure, and oxygen saturation were continuously monitored and I was present face-to-face 100% of this time.  Medications (Filter:  Administrations occurring from [phone] to 1158 on 10/07/19) (important) Continuous medications are totaled by the amount administered until 10/07/19 1158.  fentaNYL (SUBLIMAZE) injection (mcg) Total dose:  50 mcg Date/Time  Rate/Dose/Volume Action  10/07/19 1010  25 mcg Given  1102  25 mcg Given    midazolam (VERSED) injection (mg) Total dose:  2 mg Date/Time  Rate/Dose/Volume Action  10/07/19 1010  1 mg Given  1102  1 mg Given    lidocaine (PF) (XYLOCAINE) 1 % injection (mL) Total volume:  12 mL Date/Time  Rate/Dose/Volume Action  10/07/19 1017  12 mL Given    Heparin (Porcine) in NaCl 1000-0.9 UT/500ML-% SOLN (mL) Total volume:  1,000 mL Date/Time  Rate/Dose/Volume Action  10/07/19 1017  500 mL Given  1017  500 mL Given    clopidogrel (PLAVIX) tablet (mg) Total dose:  600 mg Date/Time  Rate/Dose/Volume Action  10/07/19 1054  600 mg Given    bivalirudin (ANGIOMAX) BOLUS via infusion (mg/kg) Total dose:  58.875 mg Dosing weight:  78.5 Date/Time  Rate/Dose/Volume Action  10/07/19 1054  58.875 mg Given    bivalirudin (ANGIOMAX) 250 mg in sodium chloride 0.9 % 50 mL (5 mg/mL) infusion (mg/kg/hr) Total dose:  87.39 mg Dosing weight:  78.5 Date/Time  Rate/Dose/Volume Action  10/07/19 1055  1.75 mg/kg/hr - 27.5 mL/hr New Bag/Given  1133   Stopped    nitroGLYCERIN 1 mg/10 mL (100 mcg/mL) - IR/CATH LAB (mcg) Total dose:  150 mcg Date/Time  Rate/Dose/Volume Action  10/07/19 1113  150 mcg Given    iohexol (OMNIPAQUE) 350 MG/ML injection (mL) Total volume:  150 mL Date/Time  Rate/Dose/Volume Action  10/07/19 1140  150 mL Given    Sedation Time  Sedation Time Physician-1: 1 hour 25 minutes 30 seconds  Contrast  Medication Name Total Dose  iohexol (OMNIPAQUE) 350 MG/ML injection 150 mL    Radiation/Fluoro  Fluoro time: 21.1 (min) DAP: 38.4 (mGycm2) Cumulative Air Kerma: 714.8 (mGy)  Coronary Findings  Diagnostic Dominance: Right Left Anterior Descending  Prox  LAD lesion is 100% stenosed.  Mid LAD lesion is 95% stenosed.  There is severe eccentric 95% stenosis in the mid LAD that is seen as it fills from the saphenous vein graft to diagonal.  Left Circumflex  Prox Cx lesion is 100% stenosed.  Right Coronary Artery  Prox RCA lesion is 100% stenosed.  Sequential Jump Graft Graft To 1st RPL, RPDA  Seq SVG- PDA/PLA. The sequential saphenous vein graft to the PDA and PLA branches is patent  LIMA LIMA Graft To Mid LAD  LIMA. The LIMA to LAD is atretic. There is no flow into the vessel likely because of severe proximal left subclavian artery stenosis  Saphenous Graft To 1st Diag  SVG. The saphenous vein graft to first diagonal is patent without significant stenosis. The anastomotic insertion is at the diagonal/LAD junction.  Saphenous Graft To 1st Mrg  SVG. The saphenous vein graft obtuse marginal branch is patent without significant stenosis  Intervention  Mid LAD lesion  Stent  CATHETER LAUNCHER 6FR AL1 guide catheter was inserted. Lesion crossed with guidewire using a WIRE HI TORQ WHISPER MS 190CM. Pre-stent angioplasty was performed using a BALLOON SAPPHIRE 2.0X15. Maximum pressure: 8 atm. A drug-eluting stent was successfully placed using a SYNERGY XD 2.50X20. Post-stent angioplasty was performed using a BALLOON SAPPHIRE Leonard [id number]. Maximum pressure: 18 atm. PCI is performed through the diagonal vein graft into the mid LAD. The lesion is difficult to wire, but ultimately is wired with a whisper wire. The lesion is predilated with a 2.0 mm balloon, stented with a 2.5 x 20 mm Synergy DES, and postdilated with a 2.75 mm noncompliant balloon to 18 atm.  Post-Intervention Lesion Assessment  The intervention was successful. Pre-interventional TIMI flow is 3. Post-intervention TIMI flow is 3. No complications occurred at this lesion.  There is a 0% residual stenosis post intervention.  Left Heart  Aortic Valve The aortic valve is calcified. There is  restricted aortic valve motion. Mean transaortic gradient 31 mmHg, calculated valve area 1.1 cm  Coronary Diagrams  Diagnostic Dominance: Right  Intervention   Implants   Permanent Stent  Synergy Xd 2.50x20 - [id number] - Implanted Inventory item: SYNERGY XD 2.50X20 Model/Cat number: [id number]  Manufacturer: Wilkie Aye Lot number: [id number]  Device identifier: [id number] Device identifier type: GS1  GUDID Information  Request status Request failed - device not in GUDID    As of 10/07/2019  Status: Implanted      Syngo Images  Show images for CARDIAC CATHETERIZATION Images on Long Term Storage  Show images for Desch, Kawan W Link to Procedure Log  Procedure Log    Hemo Data   Most Recent Value  Fick Cardiac Output 5.74 L/min  Fick Cardiac Output Index 3.04 (L/min)/BSA  RA A Wave 17 mmHg  RA V Wave 17 mmHg  RA Mean 14 mmHg  RV Systolic Pressure 55 mmHg  RV Diastolic Pressure 8 mmHg  RV EDP 17 mmHg  PA Systolic Pressure 53 mmHg  PA Diastolic Pressure 20 mmHg  PA Mean 31 mmHg  PW A Wave 24 mmHg  PW V Wave 27 mmHg  PW Mean 20 mmHg  AO Systolic Pressure 655 mmHg  AO Diastolic Pressure 57 mmHg  AO Mean 92 mmHg  LV Systolic Pressure 374 mmHg  LV Diastolic Pressure 18 mmHg  LV EDP 30 mmHg  AOp Systolic Pressure 827 mmHg  AOp Diastolic Pressure 59 mmHg  AOp Mean Pressure 92 mmHg  LVp Systolic Pressure 078 mmHg  LVp Diastolic Pressure 22 mmHg  LVp EDP Pressure 34 mmHg  QP/QS 1  TPVR  Index 10.19 HRUI  TSVR Index 30.23 HRUI  PVR SVR Ratio 0.14  TPVR/TSVR Ratio 0.34       Cardiac TAVR CT  TECHNIQUE: The patient was scanned on a [employer]. A 120 kV retrospective scan was triggered in the descending thoracic aorta at 111 HU's. Gantry rotation speed was 250 msecs and collimation was .6 mm. No beta blockade or nitro were given. The 3D data set was reconstructed in 5% intervals of the R-R cycle. Systolic and diastolic phases were  analyzed on a dedicated work station using MPR, MIP and VRT modes. The patient received 80 cc of contrast.  FINDINGS: Image quality: Excellent.  Noise artifact is: Limited.  Valve Morphology: The aortic valve is tricuspid. The valve is severely calcified, and severely thickened, with restricted leaflet motion in systole. There are severe bulky calcifications noted on the [place].  Aortic Valve Calcium score: 1625  Aortic annular dimension:  Phase assessed: 15%  Annular area: 492 mm2  Annular perimeter: 79.1 mm  Max diameter: 26.6 mm  Min diameter: 23.9 mm  Annular and subannular calcification: There is no annular or subannular calcifications.  Optimal coplanar projection: LAO 12 CRA 1  Coronary Artery Height above Annulus:  Left Main: 11.9 mm  Right Coronary: 17.2 mm  Sinus of Valsalva Measurements:  Non-coronary: 32 mm  Right-coronary: 31 mm  Left-coronary: 32 mm  Sinus of Valsalva Height:  Non-coronary: 21.7 mm  Right-coronary: 23.5 mm  Left-coronary: 20.9 mm  Sinotubular Junction: 26 mm  Ascending Thoracic Aorta: 33 mm  Coronary Arteries: Normal coronary origin. Right dominance. The study was performed without use of NTG and is insufficient for plaque evaluation. Severe 3-vessel native CAD. There are 3 patent vein grafts observed to a diagonal branch, obtuse marginal branch, and PDA. A LIMA graft is reported but not observed (reported to be atretic). Please refer to recent cardiac catheterization for coronary assessment.  Cardiac Morphology:  Right Atrium: Right atrial size is within normal limits.  Right Ventricle: The right ventricular cavity is within normal limits.  Left Atrium: Left atrial size is normal in size with no left atrial appendage filling defect.  Left Ventricle: The ventricular cavity size is within normal limits. There are no stigmata of prior infarction. There is no abnormal filling defect.  LVEF=74%. Septal motion consistent with postoperative state.  Pulmonary arteries: Normal in size without proximal filling defect.  Pulmonary veins: Normal pulmonary venous drainage.  Pericardium: Normal thickness with no significant effusion or calcium present.  Mitral Valve: The mitral valve is normal structure without significant calcification.  Extra-cardiac findings: See attached radiology report for non-cardiac structures.  IMPRESSION: 1. Annular measurements appropriate for 26 mm S3 (492 mm2).  2. No significant annular or subannular calcifications.  3. Sufficient coronary to annulus distance.  4. Optimal Fluoroscopic Angle for Delivery: LAO 12 CRA 1  [place] T. Audie Box, MD   Electronically Signed   By: Eleonore Chiquito   On: 10/12/2019 13:31   CT ANGIOGRAPHY CHEST, ABDOMEN AND PELVIS  TECHNIQUE: Multidetector CT imaging through the chest, abdomen and pelvis was performed using the standard protocol during bolus administration of intravenous contrast. Multiplanar reconstructed images and MIPs were obtained and reviewed to evaluate the vascular anatomy.  CONTRAST:  76m OMNIPAQUE IOHEXOL 350 MG/ML SOLN  COMPARISON:  No prior chest CT. CT of the abdomen and pelvis 06/17/2019.  FINDINGS: CTA CHEST FINDINGS  Cardiovascular: Heart size is normal. There is no significant pericardial fluid, thickening or pericardial calcification. There is aortic  atherosclerosis, as well as atherosclerosis of the great vessels of the mediastinum and the coronary arteries, including calcified atherosclerotic plaque in the left main, left anterior descending, left circumflex and right coronary arteries. Status post median sternotomy for CABG including LIMA to the LAD. Severe thickening calcification of the aortic valve.  Mediastinum/Lymph Nodes: No pathologically enlarged mediastinal or hilar lymph nodes. Esophagus is unremarkable in appearance. No axillary  lymphadenopathy.  Lungs/Pleura: No suspicious appearing pulmonary nodules or masses are noted. No acute consolidative airspace disease. No pleural effusions.  Musculoskeletal/Soft Tissues: Median sternotomy wires. There are no aggressive appearing lytic or blastic lesions noted in the visualized portions of the skeleton.  CTA ABDOMEN AND PELVIS FINDINGS  Hepatobiliary: No suspicious cystic or solid hepatic lesions. No intra or extrahepatic biliary ductal dilatation. 2 cm gallstone lying dependently in the gallbladder. Gallbladder is otherwise unremarkable in appearance.  Pancreas: No pancreatic mass. No pancreatic ductal dilatation. No pancreatic or peripancreatic fluid collections or inflammatory changes.  Spleen: Unremarkable.  Adrenals/Urinary Tract: Bilateral kidneys and bilateral adrenal glands are normal in appearance. No hydroureteronephrosis. Urinary bladder is normal in appearance.  Stomach/Bowel: Normal appearance of the stomach. No pathologic dilatation of small bowel or [place]. Numerous colonic diverticulae are noted, particularly in the sigmoid [place], without surrounding inflammatory changes to suggest an acute diverticulitis at this time. Normal appendix.  Vascular/Lymphatic: Aortic atherosclerosis, without evidence of aneurysm or dissection in the abdominal or pelvic vasculature. No lymphadenopathy noted in the abdomen or pelvis.  Reproductive: Prostate gland and seminal vesicles are unremarkable in appearance.  Other: No significant volume of ascites.  No pneumoperitoneum.  Musculoskeletal: There are no aggressive appearing lytic or blastic lesions noted in the visualized portions of the skeleton.  VASCULAR MEASUREMENTS PERTINENT TO TAVR:  AORTA:  Minimal Aortic Diameter-13 x 12 mm  Severity of Aortic Calcification-severe  RIGHT PELVIS:  Right Common Iliac Artery -  Minimal Diameter-8.8 x 7.8  mm  Tortuosity-mild  Calcification-moderate to severe  Right External Iliac Artery -  Minimal Diameter-7.0 x 6.3 mm  Tortuosity-moderate  Calcification-mild  Right Common Femoral Artery -  Minimal Diameter-6.8 x 6.5 mm  Tortuosity-mild  Calcification-moderate  LEFT PELVIS:  Left Common Iliac Artery -  Minimal Diameter-9.6 x 8.0 mm  Tortuosity-mild  Calcification-mild  Left External Iliac Artery -  Minimal Diameter-6.8 x 6.3 mm  Tortuosity-moderate  Calcification-mild  Left Common Femoral Artery -  Minimal Diameter-5.4 x 2.7 mm  Tortuosity-mild  Calcification-moderate  Review of the MIP images confirms the above findings.  IMPRESSION: 1. Vascular findings and measurements pertinent to potential TAVR procedure, as detailed above. 2. Severe thickening calcification of the aortic valve, compatible with reported clinical history of severe aortic stenosis. 3. Aortic atherosclerosis, in addition to left main and 3 vessel coronary artery disease. Status post median sternotomy for CABG including LIMA to the LAD. 4. Colonic diverticulosis without evidence of acute diverticulitis at this time. 5. Cholelithiasis without evidence of acute cholecystitis.   Electronically Signed   By: Vinnie Langton M.D.   On: 10/12/2019 12:25     EKG: NSR with baseline RBBB (10/09/2019)      Impression:  Patient has stage D1 severe symptomatic aortic stenosis.  He describes progressive symptoms of exertional shortness of breath consistent with chronic diastolic congestive heart failure, New York Heart Association functional class II.  I have personally reviewed the patient's recent transthoracic echocardiogram, diagnostic cardiac catheterization, and CT angiograms.  Echocardiogram performed Aug 03, 2019 reveals findings consistent with severe aortic stenosis and normal  left ventricular function.  Ejection fraction is estimated 60 to 65%.   The aortic valve is trileaflet with moderate to severe leaflet thickening and calcification.  There is restricted leaflet mobility involving all 3 leaflets.  Peak velocity across the aortic valve measured greater than 3.6 m/s corresponding to mean transvalvular gradient estimated 31 mmHg and aortic valve area calculated 0.82 cm.  The DVI was reported 0.24.  Diagnostic cardiac catheterization confirmed the presence of aortic stenosis and revealed severe native coronary artery disease with chronic occlusion of the left anterior descending coronary artery, left circumflex coronary artery, and the right coronary artery.  There remained continued patency of 4 out of 5 previously placed bypass grafts including graft to all 3 major vascular territories.  There was no significant vein graft disease but there was significant disease in the distal left anterior descending coronary artery beyond insertion of the patent vein graft.  This was successfully treated with PCI and stenting.  Right heart catheterization revealed moderately elevated right-sided pressures.  I agree the patient would likely benefit from aortic valve replacement.  However, I would not consider this elderly gentleman a candidate for aortic valve replacement using conventional surgical techniques under any circumstances because of his advanced age, previous coronary artery bypass surgery, and somewhat limited physical mobility.  Cardiac-gated CTA of the heart reveals anatomical characteristics consistent with aortic stenosis suitable for treatment by transcatheter aortic valve replacement without any significant complicating features and CTA of the aorta and iliac vessels demonstrate what appears to be adequate pelvic vascular access to facilitate a transfemoral approach.  EKG does reveal sinus rhythm with baseline right bundle branch block.    Plan:  The patient was counseled at length regarding treatment alternatives for management of severe  symptomatic aortic stenosis. Alternative approaches such as conventional aortic valve replacement, transcatheter aortic valve replacement, and continued medical therapy without intervention were compared and contrasted at length.  The risks associated with conventional surgical aortic valve replacement were discussed in detail, as were expectations for post-operative convalescence, and why I would be reluctant to consider this patient a candidate for conventional surgery.  Issues specific to transcatheter aortic valve replacement were discussed including questions about long term valve durability, the potential for paravalvular leak, possible increased risk of need for permanent pacemaker placement, and other technical complications related to the procedure itself.  Long-term prognosis with medical therapy was discussed. This discussion was placed in the context of the patient's own specific clinical presentation and past medical history.  All of their questions have been addressed.  The patient desires to proceed with transcatheter aortic valve replacement soon as possible.  We tentatively plan for surgery on October 27, 2019.  Following the decision to proceed with transcatheter aortic valve replacement, a discussion has been held regarding what types of management strategies would be attempted intraoperatively in the event of life-threatening complications, including whether or not the patient would be considered a candidate for the use of cardiopulmonary bypass and/or conversion to open sternotomy for attempted surgical intervention.  The patient specifically requests that should a potentially life-threatening complication develop we would not attempt emergency median sternotomy and/or other aggressive surgical procedures.  The patient has been advised of a variety of complications that might develop including but not limited to risks of death, stroke, paravalvular leak, aortic dissection or other major vascular  complications, aortic annulus rupture, device embolization, cardiac rupture or perforation, mitral regurgitation, acute myocardial infarction, arrhythmia, heart block or bradycardia requiring permanent pacemaker placement, congestive  heart failure, respiratory failure, renal failure, pneumonia, infection, other late complications related to structural valve deterioration or migration, or other complications that might ultimately cause a temporary or permanent loss of functional independence or other long term morbidity.  The patient provides full informed consent for the procedure as described and all questions were answered.    I spent in excess of 90 minutes during the conduct of this office consultation and >50% of this time involved direct face-to-face encounter with the patient for counseling and/or coordination of their care.   Valentina Gu. Roxy Manns, MD 10/20/2019 3:51 PM

## 2019-10-21 ENCOUNTER — Other Ambulatory Visit: Payer: Self-pay

## 2019-10-21 DIAGNOSIS — I350 Nonrheumatic aortic (valve) stenosis: Secondary | ICD-10-CM

## 2019-10-22 ENCOUNTER — Encounter (HOSPITAL_COMMUNITY): Payer: Self-pay

## 2019-10-22 ENCOUNTER — Telehealth: Payer: Medicare Other

## 2019-10-22 NOTE — Progress Notes (Signed)
[facility] - [place] - [place], [place] - [address] [place] [address] Phone: [phone] Fax: [phone]  [facility] - [place], [place] - [address] [place], [address] [place], [address] [place]  [address] Phone: [phone] Fax: [phone]      Your procedure is scheduled on October 27, 2019.  Report to [facility] Main Entrance "A" at 05:30 A.M., and check in at the Admitting office.  Call this number if you have problems the morning of surgery:  [phone]  Call [phone] if you have any questions prior to your surgery date Monday-Friday 8am-4pm   Remember:  Do not eat or drink after midnight the night before your surgery   Take these medicines the morning of surgery with A SIP OF WATER : Pantopraxole (Protonix)                     Do not wear jewelry.            Do not wear lotions, powders, perfumes/colognes, or deodorant.            Do not shave 48 hours prior to surgery.  Men may shave face and neck.            Do not bring valuables to the hospital.            [facility] is not responsible for any belongings or valuables.  Do NOT Smoke (Tobacco/Vaping) or drink Alcohol 24 hours prior to your procedure If you use a CPAP at night, you may bring all equipment for your overnight stay.   Contacts, glasses, dentures or bridgework may not be worn into surgery.      For patients admitted to the hospital, discharge time will be determined by your treatment team.   Patients discharged the day of surgery will not be allowed to drive home, and someone needs to stay with them for 24 hours.    Special instructions:   [facility]- Preparing For Surgery  Before surgery, you can play an important role. Because skin is not sterile, your skin needs to be as free of germs as possible. You can reduce the number of germs on your skin by washing with CHG (chlorahexidine gluconate) Soap before surgery.  CHG is an  antiseptic cleaner which kills germs and bonds with the skin to continue killing germs even after washing.    Oral Hygiene is also important to reduce your risk of infection.  Remember - BRUSH YOUR TEETH THE MORNING OF SURGERY WITH YOUR REGULAR TOOTHPASTE  Please do not use if you have an allergy to CHG or antibacterial soaps. If your skin becomes reddened/irritated stop using the CHG.  Do not shave (including legs and underarms) for at least 48 hours prior to first CHG shower. It is OK to shave your face.  Please follow these instructions carefully.   1. Shower the NIGHT BEFORE SURGERY and the MORNING OF SURGERY with CHG Soap.   2. If you chose to wash your hair, wash your hair first as usual with your normal shampoo.  3. After you shampoo, rinse your hair and body thoroughly to remove the shampoo.  4. Use CHG as you would any other liquid soap. You can apply CHG directly to the skin and wash gently with a scrungie or a clean washcloth.   5. Apply the CHG Soap to your body ONLY FROM THE NECK DOWN.  Do not use on  open wounds or open sores. Avoid contact with your eyes, ears, mouth and genitals (private parts). Wash Face and genitals (private parts)  with your normal soap.   6. Wash thoroughly, paying special attention to the area where your surgery will be performed.  7. Thoroughly rinse your body with warm water from the neck down.  8. DO NOT shower/wash with your normal soap after using and rinsing off the CHG Soap.  9. Pat yourself dry with a CLEAN TOWEL.  10. Wear CLEAN PAJAMAS to bed the night before surgery  11. Place CLEAN SHEETS on your bed the night of your first shower and DO NOT SLEEP WITH PETS.   Day of Surgery: Wear Clean/Comfortable clothing the morning of surgery Do not apply any deodorants/lotions.   Remember to brush your teeth WITH YOUR REGULAR TOOTHPASTE.   Please read over the following fact sheets that you were given.

## 2019-10-23 ENCOUNTER — Ambulatory Visit (HOSPITAL_COMMUNITY)
Admission: RE | Admit: 2019-10-23 | Discharge: 2019-10-23 | Disposition: A | Discharge status: Home / Self Care | Payer: Medicare Other | Source: Ambulatory Visit | Attending: Cardiovascular Disease | Admitting: Cardiovascular Disease

## 2019-10-23 ENCOUNTER — Other Ambulatory Visit (HOSPITAL_COMMUNITY)
Admission: RE | Admit: 2019-10-23 | Discharge: 2019-10-23 | Disposition: A | Discharge status: Home / Self Care | Payer: Medicare Other | Source: Ambulatory Visit | Attending: Cardiovascular Disease | Admitting: Cardiovascular Disease

## 2019-10-23 ENCOUNTER — Other Ambulatory Visit: Payer: Self-pay

## 2019-10-23 ENCOUNTER — Telehealth: Payer: Self-pay

## 2019-10-23 ENCOUNTER — Other Ambulatory Visit: Payer: Self-pay | Admitting: Physician Assistant

## 2019-10-23 ENCOUNTER — Encounter (HOSPITAL_COMMUNITY): Payer: Self-pay

## 2019-10-23 ENCOUNTER — Other Ambulatory Visit (HOSPITAL_COMMUNITY): Payer: Medicare Other

## 2019-10-23 ENCOUNTER — Inpatient Hospital Stay (HOSPITAL_COMMUNITY): Admission: RE | Admit: 2019-10-23 | Payer: Medicare Other | Source: Ambulatory Visit

## 2019-10-23 ENCOUNTER — Encounter (HOSPITAL_COMMUNITY)
Admission: RE | Admit: 2019-10-23 | Discharge: 2019-10-23 | Disposition: A | Discharge status: Home / Self Care | Payer: Medicare Other | Source: Ambulatory Visit | Attending: Cardiovascular Disease | Admitting: Cardiovascular Disease

## 2019-10-23 DIAGNOSIS — I350 Nonrheumatic aortic (valve) stenosis: Secondary | ICD-10-CM | POA: Insufficient documentation

## 2019-10-23 DIAGNOSIS — Z20822 Contact with and (suspected) exposure to covid-19: Secondary | ICD-10-CM | POA: Insufficient documentation

## 2019-10-23 DIAGNOSIS — Z952 Presence of prosthetic heart valve: Secondary | ICD-10-CM

## 2019-10-23 DIAGNOSIS — Z01812 Encounter for preprocedural laboratory examination: Secondary | ICD-10-CM | POA: Insufficient documentation

## 2019-10-23 DIAGNOSIS — Z01818 Encounter for other preprocedural examination: Secondary | ICD-10-CM | POA: Diagnosis not present

## 2019-10-23 HISTORY — DX: Dyspnea, unspecified: R06.00

## 2019-10-23 HISTORY — DX: Gastro-esophageal reflux disease without esophagitis: K21.9

## 2019-10-23 HISTORY — DX: Acute myocardial infarction, unspecified: I21.9

## 2019-10-23 LAB — COMPREHENSIVE METABOLIC PANEL
ALT: 16 U/L (ref 0–44)
AST: 21 U/L (ref 15–41)
Albumin: 4.1 g/dL (ref 3.5–5.0)
Alkaline Phosphatase: 75 U/L (ref 38–126)
Anion gap: 12 (ref 5–15)
BUN: 15 mg/dL (ref 8–23)
CO2: 21 mmol/L — ABNORMAL LOW (ref 22–32)
Calcium: 9.5 mg/dL (ref 8.9–10.3)
Chloride: 107 mmol/L (ref 98–111)
Creatinine, Ser: 0.95 mg/dL (ref 0.61–1.24)
GFR calc Af Amer: >60 mL/min (ref >60)
GFR calc non Af Amer: >60 mL/min (ref >60)
Glucose, Bld: 83 mg/dL (ref 70–99)
Potassium: 3.3 mmol/L — ABNORMAL LOW (ref 3.5–5.1)
Sodium: 140 mmol/L (ref 135–145)
Total Bilirubin: 1 mg/dL (ref 0.3–1.2)
Total Protein: 7.2 g/dL (ref 6.5–8.1)

## 2019-10-23 LAB — CBC
HCT: 39 % (ref 39.0–52.0)
Hemoglobin: 13.2 g/dL (ref 13.0–17.0)
MCH: 30.3 pg (ref 26.0–34.0)
MCHC: 33.8 g/dL (ref 30.0–36.0)
MCV: 89.4 fL (ref 80.0–100.0)
Platelets: 127 10*3/uL — ABNORMAL LOW (ref 150–400)
RBC: 4.36 MIL/uL (ref 4.22–5.81)
RDW: 14.2 % (ref 11.5–15.5)
WBC: 8.5 10*3/uL (ref 4.0–10.5)
nRBC: 0 % (ref 0.0–0.2)

## 2019-10-23 LAB — BLOOD GAS, ARTERIAL
Acid-base deficit: 1.3 mmol/L (ref 0.0–2.0)
Bicarbonate: 22.3 mmol/L (ref 20.0–28.0)
FIO2: 21
O2 Saturation: 98.8 %
Patient temperature: 37
pCO2 arterial: 33.9 mmHg (ref 32.0–48.0)
pH, Arterial: 7.435 (ref 7.350–7.450)
pO2, Arterial: 117 mmHg — ABNORMAL HIGH (ref 83.0–108.0)

## 2019-10-23 LAB — URINALYSIS, ROUTINE W REFLEX MICROSCOPIC
Bilirubin Urine: NEGATIVE
Glucose, UA: NEGATIVE mg/dL
Hgb urine dipstick: NEGATIVE
Ketones, ur: NEGATIVE mg/dL
Leukocytes,Ua: NEGATIVE
Nitrite: NEGATIVE
Protein, ur: NEGATIVE mg/dL
Specific Gravity, Urine: 1.004 — ABNORMAL LOW (ref 1.005–1.030)
pH: 5 (ref 5.0–8.0)

## 2019-10-23 LAB — PROTIME-INR
INR: 1.1 (ref 0.8–1.2)
Prothrombin Time: 13.5 seconds (ref 11.4–15.2)

## 2019-10-23 LAB — TYPE AND SCREEN
ABO/RH(D): O NEG
Antibody Screen: NEGATIVE

## 2019-10-23 LAB — HEMOGLOBIN A1C
Hgb A1c MFr Bld: 4.8 % (ref 4.8–5.6)
Mean Plasma Glucose: 91.06 mg/dL

## 2019-10-23 LAB — SURGICAL PCR SCREEN
MRSA, PCR: NEGATIVE
Staphylococcus aureus: NEGATIVE

## 2019-10-23 LAB — APTT: aPTT: 33 seconds (ref 24–36)

## 2019-10-23 LAB — BRAIN NATRIURETIC PEPTIDE: B Natriuretic Peptide: 108.8 pg/mL — ABNORMAL HIGH (ref 0.0–100.0)

## 2019-10-23 LAB — SARS CORONAVIRUS 2 (TAT 6-24 HRS): SARS Coronavirus 2: NEGATIVE

## 2019-10-23 NOTE — Telephone Encounter (Signed)
Potassium level 3.3 on PAT labs.  The pt is currently taking potassium chloride one tablet by mouth daily.  Per Carlean Jews PA-C the pt can take two tablets by mouth daily on Saturday and Sunday and resume normal dosage on Monday.  We will plan to repeat BMP morning of surgery.  I spoke with the pt and he verbalized understanding of these instructions.

## 2019-10-23 NOTE — Progress Notes (Signed)
PCP: Dr. Marina Goodell Cardiologist: Dr. Bing Matter  EKG: within 1 month: 10/07/19 CXR: within 2 weeks: Today ECHO: 08/03/19 Stress Test: Denies Cardiac Cath: 10/07/19  Had Covid testing  Patient denies shortness of breath, fever, cough, and chest pain at PAT appointment.  Patient verbalized understanding of instructions provided today at the PAT appointment.  Patient asked to review instructions at home and day of surgery.

## 2019-10-26 ENCOUNTER — Encounter (HOSPITAL_COMMUNITY): Payer: Self-pay | Admitting: Cardiovascular Disease

## 2019-10-26 MED ORDER — SODIUM CHLORIDE 0.9 % IV SOLN
INTRAVENOUS | Status: DC
  Filled 2019-10-26: qty 30

## 2019-10-26 MED ORDER — SODIUM CHLORIDE 0.9 % IV SOLN
1.5000 g | INTRAVENOUS | Status: AC
  Administered 2019-10-27: 1.5 g via INTRAVENOUS
  Filled 2019-10-26: qty 1.5

## 2019-10-26 MED ORDER — POTASSIUM CHLORIDE 2 MEQ/ML IV SOLN
80.0000 meq | INTRAVENOUS | Status: DC
  Filled 2019-10-26: qty 40

## 2019-10-26 MED ORDER — MAGNESIUM SULFATE 50 % IJ SOLN
40.0000 meq | INTRAMUSCULAR | Status: DC
  Filled 2019-10-26: qty 9.85

## 2019-10-26 MED ORDER — NOREPINEPHRINE 4 MG/250ML-% IV SOLN
0.0000 ug/min | INTRAVENOUS | Status: AC
  Administered 2019-10-27: 2 ug/min via INTRAVENOUS
  Filled 2019-10-26: qty 250

## 2019-10-26 MED ORDER — DEXMEDETOMIDINE HCL IN NACL 400 MCG/100ML IV SOLN
0.1000 ug/kg/h | INTRAVENOUS | Status: AC
  Administered 2019-10-27: 38.2 ug via INTRAVENOUS
  Administered 2019-10-27: 1 ug/kg/h via INTRAVENOUS
  Filled 2019-10-26 (×2): qty 100

## 2019-10-26 MED ORDER — VANCOMYCIN HCL 1250 MG/250ML IV SOLN
1250.0000 mg | INTRAVENOUS | Status: AC
  Administered 2019-10-27: 1250 mg via INTRAVENOUS
  Filled 2019-10-26 (×2): qty 250

## 2019-10-27 ENCOUNTER — Inpatient Hospital Stay (HOSPITAL_COMMUNITY): Payer: Medicare Other | Admitting: Physician Assistant

## 2019-10-27 ENCOUNTER — Encounter (HOSPITAL_COMMUNITY)
Admission: RE | Disposition: A | Discharge status: Home / Self Care | Payer: Medicare Other | Source: Home / Self Care | Attending: Thoracic Surgery (Cardiothoracic Vascular Surgery)

## 2019-10-27 ENCOUNTER — Other Ambulatory Visit: Payer: Self-pay

## 2019-10-27 ENCOUNTER — Inpatient Hospital Stay (HOSPITAL_COMMUNITY)
Admission: RE | Admit: 2019-10-27 | Discharge: 2019-10-27 | Disposition: A | Discharge status: Home / Self Care | Payer: Medicare Other | Source: Ambulatory Visit | Attending: Cardiovascular Disease | Admitting: Cardiovascular Disease

## 2019-10-27 ENCOUNTER — Inpatient Hospital Stay (HOSPITAL_COMMUNITY): Payer: Medicare Other

## 2019-10-27 ENCOUNTER — Inpatient Hospital Stay (HOSPITAL_COMMUNITY)
Admission: RE | Admit: 2019-10-27 | Discharge: 2019-10-28 | DRG: 267 | Disposition: A | Discharge status: Home / Self Care | Payer: Medicare Other | Attending: Thoracic Surgery (Cardiothoracic Vascular Surgery) | Admitting: Thoracic Surgery (Cardiothoracic Vascular Surgery)

## 2019-10-27 ENCOUNTER — Encounter (HOSPITAL_COMMUNITY): Payer: Self-pay | Admitting: Cardiovascular Disease

## 2019-10-27 ENCOUNTER — Inpatient Hospital Stay (HOSPITAL_COMMUNITY): Payer: Medicare Other | Admitting: Certified Registered Nurse Anesthetist

## 2019-10-27 DIAGNOSIS — Z951 Presence of aortocoronary bypass graft: Secondary | ICD-10-CM | POA: Diagnosis not present

## 2019-10-27 DIAGNOSIS — Z833 Family history of diabetes mellitus: Secondary | ICD-10-CM | POA: Diagnosis not present

## 2019-10-27 DIAGNOSIS — I708 Atherosclerosis of other arteries: Secondary | ICD-10-CM | POA: Diagnosis present

## 2019-10-27 DIAGNOSIS — Z955 Presence of coronary angioplasty implant and graft: Secondary | ICD-10-CM | POA: Diagnosis not present

## 2019-10-27 DIAGNOSIS — M21372 Foot drop, left foot: Secondary | ICD-10-CM | POA: Diagnosis present

## 2019-10-27 DIAGNOSIS — Z006 Encounter for examination for normal comparison and control in clinical research program: Secondary | ICD-10-CM | POA: Diagnosis not present

## 2019-10-27 DIAGNOSIS — Z823 Family history of stroke: Secondary | ICD-10-CM | POA: Diagnosis not present

## 2019-10-27 DIAGNOSIS — J939 Pneumothorax, unspecified: Secondary | ICD-10-CM

## 2019-10-27 DIAGNOSIS — I350 Nonrheumatic aortic (valve) stenosis: Secondary | ICD-10-CM

## 2019-10-27 DIAGNOSIS — I4510 Unspecified right bundle-branch block: Secondary | ICD-10-CM

## 2019-10-27 DIAGNOSIS — Z8673 Personal history of transient ischemic attack (TIA), and cerebral infarction without residual deficits: Secondary | ICD-10-CM | POA: Diagnosis not present

## 2019-10-27 DIAGNOSIS — F0390 Unspecified dementia without behavioral disturbance: Secondary | ICD-10-CM | POA: Diagnosis present

## 2019-10-27 DIAGNOSIS — I2510 Atherosclerotic heart disease of native coronary artery without angina pectoris: Secondary | ICD-10-CM | POA: Diagnosis present

## 2019-10-27 DIAGNOSIS — I110 Hypertensive heart disease with heart failure: Secondary | ICD-10-CM | POA: Diagnosis present

## 2019-10-27 DIAGNOSIS — M21371 Foot drop, right foot: Secondary | ICD-10-CM | POA: Diagnosis present

## 2019-10-27 DIAGNOSIS — Z841 Family history of disorders of kidney and ureter: Secondary | ICD-10-CM

## 2019-10-27 DIAGNOSIS — Z954 Presence of other heart-valve replacement: Secondary | ICD-10-CM | POA: Diagnosis not present

## 2019-10-27 DIAGNOSIS — G629 Polyneuropathy, unspecified: Secondary | ICD-10-CM | POA: Diagnosis present

## 2019-10-27 DIAGNOSIS — I452 Bifascicular block: Secondary | ICD-10-CM | POA: Diagnosis not present

## 2019-10-27 DIAGNOSIS — E785 Hyperlipidemia, unspecified: Secondary | ICD-10-CM | POA: Diagnosis present

## 2019-10-27 DIAGNOSIS — Z952 Presence of prosthetic heart valve: Secondary | ICD-10-CM

## 2019-10-27 DIAGNOSIS — I25119 Atherosclerotic heart disease of native coronary artery with unspecified angina pectoris: Secondary | ICD-10-CM | POA: Diagnosis not present

## 2019-10-27 DIAGNOSIS — I10 Essential (primary) hypertension: Secondary | ICD-10-CM | POA: Diagnosis not present

## 2019-10-27 DIAGNOSIS — I440 Atrioventricular block, first degree: Secondary | ICD-10-CM | POA: Diagnosis not present

## 2019-10-27 DIAGNOSIS — Z8249 Family history of ischemic heart disease and other diseases of the circulatory system: Secondary | ICD-10-CM | POA: Diagnosis not present

## 2019-10-27 DIAGNOSIS — Z87891 Personal history of nicotine dependence: Secondary | ICD-10-CM | POA: Diagnosis not present

## 2019-10-27 DIAGNOSIS — I25118 Atherosclerotic heart disease of native coronary artery with other forms of angina pectoris: Secondary | ICD-10-CM | POA: Diagnosis present

## 2019-10-27 DIAGNOSIS — Z8261 Family history of arthritis: Secondary | ICD-10-CM | POA: Diagnosis not present

## 2019-10-27 DIAGNOSIS — F329 Major depressive disorder, single episode, unspecified: Secondary | ICD-10-CM | POA: Diagnosis present

## 2019-10-27 DIAGNOSIS — J811 Chronic pulmonary edema: Secondary | ICD-10-CM | POA: Diagnosis not present

## 2019-10-27 HISTORY — PX: TEE WITHOUT CARDIOVERSION: SHX5443

## 2019-10-27 HISTORY — PX: TRANSCATHETER AORTIC VALVE REPLACEMENT, TRANSFEMORAL: SHX6400

## 2019-10-27 HISTORY — DX: Presence of prosthetic heart valve: Z95.2

## 2019-10-27 LAB — POCT I-STAT, CHEM 8
BUN: 11 mg/dL (ref 8–23)
BUN: 11 mg/dL (ref 8–23)
Calcium, Ion: 1.27 mmol/L (ref 1.15–1.40)
Calcium, Ion: 1.23 mmol/L (ref 1.15–1.40)
Chloride: 105 mmol/L (ref 98–111)
Chloride: 105 mmol/L (ref 98–111)
Creatinine, Ser: 0.9 mg/dL (ref 0.61–1.24)
Creatinine, Ser: 0.8 mg/dL (ref 0.61–1.24)
Glucose, Bld: 104 mg/dL — ABNORMAL HIGH (ref 70–99)
Glucose, Bld: 111 mg/dL — ABNORMAL HIGH (ref 70–99)
HCT: 31 % — ABNORMAL LOW (ref 39.0–52.0)
HCT: 28 % — ABNORMAL LOW (ref 39.0–52.0)
Hemoglobin: 10.5 g/dL — ABNORMAL LOW (ref 13.0–17.0)
Hemoglobin: 9.5 g/dL — ABNORMAL LOW (ref 13.0–17.0)
Potassium: 3.4 mmol/L — ABNORMAL LOW (ref 3.5–5.1)
Potassium: 3.5 mmol/L (ref 3.5–5.1)
Sodium: 144 mmol/L (ref 135–145)
Sodium: 143 mmol/L (ref 135–145)
TCO2: 25 mmol/L (ref 22–32)
TCO2: 25 mmol/L (ref 22–32)

## 2019-10-27 LAB — ABO/RH: ABO/RH(D): O NEG

## 2019-10-27 LAB — BASIC METABOLIC PANEL
Anion gap: 9 (ref 5–15)
BUN: 12 mg/dL (ref 8–23)
CO2: 24 mmol/L (ref 22–32)
Calcium: 9.4 mg/dL (ref 8.9–10.3)
Chloride: 105 mmol/L (ref 98–111)
Creatinine, Ser: 1.02 mg/dL (ref 0.61–1.24)
GFR calc Af Amer: >60 mL/min (ref >60)
GFR calc non Af Amer: >60 mL/min (ref >60)
Glucose, Bld: 100 mg/dL — ABNORMAL HIGH (ref 70–99)
Potassium: 3.3 mmol/L — ABNORMAL LOW (ref 3.5–5.1)
Sodium: 138 mmol/L (ref 135–145)

## 2019-10-27 LAB — ECHOCARDIOGRAM LIMITED
AR max vel: 3.29 cm2
AV Area VTI: 3.7 cm2
AV Area mean vel: 3.16 cm2
AV Mean grad: 2 mmHg
AV Peak grad: 5.1 mmHg
Ao pk vel: 1.13 m/s

## 2019-10-27 LAB — POCT ACTIVATED CLOTTING TIME
Activated Clotting Time: 136 seconds
Activated Clotting Time: 136 seconds
Activated Clotting Time: 114 seconds
Activated Clotting Time: 290 seconds

## 2019-10-27 SURGERY — IMPLANTATION, AORTIC VALVE, TRANSCATHETER, FEMORAL APPROACH
Anesthesia: Monitor Anesthesia Care

## 2019-10-27 MED ORDER — NOREPINEPHRINE BITARTRATE 1 MG/ML IV SOLN
0.0000 ug/min | INTRAVENOUS | Status: DC
  Filled 2019-10-27: qty 4

## 2019-10-27 MED ORDER — CHLORHEXIDINE GLUCONATE 4 % EX LIQD
30.0000 mL | CUTANEOUS | Status: DC
  Filled 2019-10-27: qty 30

## 2019-10-27 MED ORDER — METOPROLOL TARTRATE 5 MG/5ML IV SOLN: 2.5000 mg | INTRAVENOUS | Status: DC | PRN

## 2019-10-27 MED ORDER — ONDANSETRON HCL 4 MG/2ML IJ SOLN: 4.0000 mg | Freq: Four times a day (QID) | INTRAMUSCULAR | Status: DC | PRN

## 2019-10-27 MED ORDER — SODIUM CHLORIDE 0.9% FLUSH: 3.0000 mL | Freq: Two times a day (BID) | INTRAVENOUS | Status: DC

## 2019-10-27 MED ORDER — GABAPENTIN 300 MG PO CAPS
300.0000 mg | ORAL_CAPSULE | Freq: Two times a day (BID) | ORAL | Status: DC
  Administered 2019-10-27: 300 mg via ORAL
  Filled 2019-10-27: qty 1

## 2019-10-27 MED ORDER — GLYCOPYRROLATE PF 0.2 MG/ML IJ SOSY
PREFILLED_SYRINGE | INTRAMUSCULAR | Status: DC | PRN
Start: 2019-10-27 — End: 2019-10-27
  Administered 2019-10-27: .1 mg via INTRAVENOUS

## 2019-10-27 MED ORDER — CHLORHEXIDINE GLUCONATE CLOTH 2 % EX PADS: 6.0000 | MEDICATED_PAD | Freq: Every day | CUTANEOUS | Status: DC

## 2019-10-27 MED ORDER — MIDAZOLAM HCL 5 MG/5ML IJ SOLN
INTRAMUSCULAR | Status: AC
  Filled 2019-10-27: qty 5

## 2019-10-27 MED ORDER — HEPARIN SODIUM (PORCINE) 1000 UNIT/ML IJ SOLN
INTRAMUSCULAR | Status: DC | PRN
Start: 2019-10-27 — End: 2019-10-27
  Administered 2019-10-27: 11000 [IU] via INTRAVENOUS

## 2019-10-27 MED ORDER — MORPHINE SULFATE (PF) 4 MG/ML IV SOLN: 1.0000 mg | INTRAVENOUS | Status: DC | PRN

## 2019-10-27 MED ORDER — SODIUM CHLORIDE 0.9 % IV SOLN
1.5000 g | Freq: Two times a day (BID) | INTRAVENOUS | Status: DC
  Administered 2019-10-27 – 2019-10-28 (×2): 1.5 g via INTRAVENOUS
  Filled 2019-10-27 (×4): qty 1.5

## 2019-10-27 MED ORDER — PROTAMINE SULFATE 10 MG/ML IV SOLN
INTRAVENOUS | Status: DC | PRN
  Administered 2019-10-27: 10 mg via INTRAVENOUS
  Administered 2019-10-27: 100 mg via INTRAVENOUS

## 2019-10-27 MED ORDER — ENALAPRIL MALEATE 20 MG PO TABS
20.0000 mg | ORAL_TABLET | Freq: Every day | ORAL | Status: DC
  Administered 2019-10-28: 20 mg via ORAL
  Filled 2019-10-27: qty 1

## 2019-10-27 MED ORDER — HEPARIN (PORCINE) IN NACL 1000-0.9 UT/500ML-% IV SOLN
INTRAVENOUS | Status: AC
  Filled 2019-10-27: qty 500

## 2019-10-27 MED ORDER — CHLORHEXIDINE GLUCONATE 0.12 % MT SOLN
OROMUCOSAL | Status: AC
  Administered 2019-10-27: 15 mL via OROMUCOSAL
  Filled 2019-10-27: qty 15

## 2019-10-27 MED ORDER — HEPARIN (PORCINE) IN NACL 1000-0.9 UT/500ML-% IV SOLN
INTRAVENOUS | Status: DC | PRN
  Administered 2019-10-27 (×3): 500 mL

## 2019-10-27 MED ORDER — LIDOCAINE HCL (PF) 1 % IJ SOLN
INTRAMUSCULAR | Status: AC
  Filled 2019-10-27: qty 30

## 2019-10-27 MED ORDER — VANCOMYCIN HCL IN DEXTROSE 1-5 GM/200ML-% IV SOLN
1000.0000 mg | Freq: Once | INTRAVENOUS | Status: AC
  Administered 2019-10-27: 1000 mg via INTRAVENOUS
  Filled 2019-10-27: qty 200

## 2019-10-27 MED ORDER — OXYCODONE HCL 5 MG PO TABS
5.0000 mg | ORAL_TABLET | ORAL | Status: DC | PRN
  Administered 2019-10-27: 5 mg via ORAL
  Filled 2019-10-27: qty 1

## 2019-10-27 MED ORDER — SODIUM CHLORIDE 0.9% FLUSH
10.0000 mL | Freq: Two times a day (BID) | INTRAVENOUS | Status: DC
  Administered 2019-10-27: 20 mL

## 2019-10-27 MED ORDER — SODIUM CHLORIDE 0.9% FLUSH: 3.0000 mL | INTRAVENOUS | Status: DC | PRN

## 2019-10-27 MED ORDER — SODIUM CHLORIDE 0.9% FLUSH: 10.0000 mL | INTRAVENOUS | Status: DC | PRN

## 2019-10-27 MED ORDER — ASPIRIN EC 81 MG PO TBEC
81.0000 mg | DELAYED_RELEASE_TABLET | Freq: Every day | ORAL | Status: DC
  Administered 2019-10-28: 81 mg via ORAL
  Filled 2019-10-27: qty 1

## 2019-10-27 MED ORDER — CHLORHEXIDINE GLUCONATE CLOTH 2 % EX PADS
6.0000 | MEDICATED_PAD | Freq: Every day | CUTANEOUS | Status: DC
  Administered 2019-10-27: 6 via TOPICAL

## 2019-10-27 MED ORDER — PROPOFOL 500 MG/50ML IV EMUL
INTRAVENOUS | Status: DC | PRN
  Administered 2019-10-27: 15 ug/kg/min via INTRAVENOUS

## 2019-10-27 MED ORDER — SODIUM CHLORIDE 0.9 % IV SOLN: INTRAVENOUS | Status: DC

## 2019-10-27 MED ORDER — LIDOCAINE HCL (PF) 1 % IJ SOLN
INTRAMUSCULAR | Status: DC | PRN
  Administered 2019-10-27: 5 mL via INTRADERMAL
  Administered 2019-10-27: 10 mL via INTRADERMAL
  Administered 2019-10-27: 5 mL via INTRADERMAL

## 2019-10-27 MED ORDER — NOREPINEPHRINE 4 MG/250ML-% IV SOLN: 2.0000 ug/min | INTRAVENOUS | Status: DC

## 2019-10-27 MED ORDER — FENTANYL CITRATE (PF) 100 MCG/2ML IJ SOLN
INTRAMUSCULAR | Status: DC | PRN
  Administered 2019-10-27 (×3): 25 ug via INTRAVENOUS

## 2019-10-27 MED ORDER — IOHEXOL 350 MG/ML SOLN
INTRAVENOUS | Status: AC
  Filled 2019-10-27: qty 1

## 2019-10-27 MED ORDER — PHENYLEPHRINE HCL-NACL 20-0.9 MG/250ML-% IV SOLN
0.0000 ug/min | INTRAVENOUS | Status: DC
  Filled 2019-10-27: qty 250

## 2019-10-27 MED ORDER — CHLORHEXIDINE GLUCONATE 4 % EX LIQD
60.0000 mL | Freq: Once | CUTANEOUS | Status: DC
  Filled 2019-10-27: qty 60

## 2019-10-27 MED ORDER — SERTRALINE HCL 50 MG PO TABS
50.0000 mg | ORAL_TABLET | Freq: Every morning | ORAL | Status: DC
  Administered 2019-10-28: 50 mg via ORAL
  Filled 2019-10-27: qty 1

## 2019-10-27 MED ORDER — ACETAMINOPHEN 325 MG PO TABS: 650.0000 mg | ORAL_TABLET | Freq: Four times a day (QID) | ORAL | Status: DC | PRN

## 2019-10-27 MED ORDER — SODIUM CHLORIDE 0.9 % IV SOLN: 250.0000 mL | INTRAVENOUS | Status: DC | PRN

## 2019-10-27 MED ORDER — CHLORHEXIDINE GLUCONATE 0.12 % MT SOLN
15.0000 mL | Freq: Once | OROMUCOSAL | Status: AC
  Administered 2019-10-27: 15 mL via OROMUCOSAL
  Filled 2019-10-27: qty 15

## 2019-10-27 MED ORDER — NITROGLYCERIN IN D5W 200-5 MCG/ML-% IV SOLN: 0.0000 ug/min | INTRAVENOUS | Status: DC

## 2019-10-27 MED ORDER — LACTATED RINGERS IV SOLN
INTRAVENOUS | Status: DC | PRN
Start: 2019-10-27 — End: 2019-10-27

## 2019-10-27 MED ORDER — CLOPIDOGREL BISULFATE 75 MG PO TABS
75.0000 mg | ORAL_TABLET | Freq: Every day | ORAL | Status: DC
  Administered 2019-10-28: 75 mg via ORAL
  Filled 2019-10-27: qty 1

## 2019-10-27 MED ORDER — MIDAZOLAM HCL 5 MG/5ML IJ SOLN
INTRAMUSCULAR | Status: DC | PRN
  Administered 2019-10-27: 1 mg via INTRAVENOUS

## 2019-10-27 MED ORDER — FENTANYL CITRATE (PF) 100 MCG/2ML IJ SOLN
INTRAMUSCULAR | Status: AC
  Filled 2019-10-27: qty 2

## 2019-10-27 MED ORDER — IOHEXOL 350 MG/ML SOLN
INTRAVENOUS | Status: DC | PRN
  Administered 2019-10-27: 60 mL

## 2019-10-27 MED ORDER — PROPOFOL 10 MG/ML IV BOLUS
INTRAVENOUS | Status: DC | PRN
Start: 2019-10-27 — End: 2019-10-27
  Administered 2019-10-27 (×2): 10 mg via INTRAVENOUS

## 2019-10-27 MED ORDER — TRAMADOL HCL 50 MG PO TABS: 50.0000 mg | ORAL_TABLET | ORAL | Status: DC | PRN

## 2019-10-27 MED ORDER — PANTOPRAZOLE SODIUM 40 MG PO TBEC
40.0000 mg | DELAYED_RELEASE_TABLET | Freq: Every day | ORAL | Status: DC
  Administered 2019-10-28: 40 mg via ORAL
  Filled 2019-10-27: qty 1

## 2019-10-27 MED ORDER — ROSUVASTATIN CALCIUM 20 MG PO TABS: 20.0000 mg | ORAL_TABLET | Freq: Every day | ORAL | Status: DC

## 2019-10-27 SURGICAL SUPPLY — 35 items
BAG SNAP BAND KOVER 36X36 (MISCELLANEOUS) ×6 IMPLANT
BLANKET WARM UNDERBOD FULL ACC (MISCELLANEOUS) ×3 IMPLANT
CABLE ADAPT PACING TEMP 12FT (ADAPTER) ×2 IMPLANT
CATH 26 ULTRA DELIVERY (CATHETERS) ×2 IMPLANT
CATH DIAG 6FR PIGTAIL ANGLED (CATHETERS) ×4 IMPLANT
CATH INFINITI 6F AL2 (CATHETERS) ×2 IMPLANT
CATH TEMPO TEMP PACE LEAD (CATHETERS) IMPLANT
CATHETER TEMPO TEMP PACE LEAD (CATHETERS) ×3
CLOSURE MYNX CONTROL 6F/7F (Vascular Products) ×2 IMPLANT
CRIMPER (MISCELLANEOUS) ×2 IMPLANT
DEVICE CLOSURE PERCLS PRGLD 6F (VASCULAR PRODUCTS) IMPLANT
ELECT DEFIB PAD ADLT CADENCE (PAD) ×2 IMPLANT
GUIDEWIRE SAFE TJ AMPLATZ EXST (WIRE) ×2 IMPLANT
KIT HEART LEFT (KITS) ×3 IMPLANT
KIT MICROPUNCTURE NIT STIFF (SHEATH) ×2 IMPLANT
PACK CARDIAC CATHETERIZATION (CUSTOM PROCEDURE TRAY) ×3 IMPLANT
PERCLOSE PROGLIDE 6F (VASCULAR PRODUCTS) ×6
SHEATH 14X36 EDWARDS (SHEATH) ×2 IMPLANT
SHEATH BRITE TIP 7FR 35CM (SHEATH) ×2 IMPLANT
SHEATH PINNACLE 6F 10CM (SHEATH) ×4 IMPLANT
SHEATH PINNACLE 8F 10CM (SHEATH) ×2 IMPLANT
SHEATH PROBE COVER 6X72 (BAG) ×2 IMPLANT
SLEEVE REPOSITIONING LENGTH 30 (MISCELLANEOUS) ×2 IMPLANT
STOPCOCK MORSE 400PSI 3WAY (MISCELLANEOUS) ×6 IMPLANT
SYR MEDRAD MARK 7 150ML (SYRINGE) ×2 IMPLANT
TRANSDUCER W/STOPCOCK (MISCELLANEOUS) ×6 IMPLANT
TUBE CONN 8.8X1320 FR HP M-F (CONNECTOR) ×2 IMPLANT
TUBING ART PRESS 72  MALE/FEM (TUBING) ×3
TUBING ART PRESS 72 MALE/FEM (TUBING) IMPLANT
TUBING CIL FLEX 10 FLL-RA (TUBING) ×2 IMPLANT
VALVE 26 ULTRA SAPIEN KIT (Valve) ×2 IMPLANT
WIRE AMPLATZ SS-J .035X180CM (WIRE) ×4 IMPLANT
WIRE EMERALD 3MM-J .035X150CM (WIRE) ×2 IMPLANT
WIRE EMERALD 3MM-J .035X260CM (WIRE) ×2 IMPLANT
WIRE EMERALD ST .035X260CM (WIRE) ×2 IMPLANT

## 2019-10-27 NOTE — Interval H&P Note (Signed)
History and Physical Interval Note:  10/27/2019 5:45 AM  Jack Hodges  has presented today for surgery, with the diagnosis of Severe Aortic Stenosis.  The various methods of treatment have been discussed with the patient and family. After consideration of risks, benefits and other options for treatment, the patient has consented to  Procedure(s): TRANSCATHETER AORTIC VALVE REPLACEMENT, TRANSFEMORAL (N/A) TRANSESOPHAGEAL ECHOCARDIOGRAM (TEE) (N/A) as a surgical intervention.  The patient's history has been reviewed, patient examined, no change in status, stable for surgery.  I have reviewed the patient's chart and labs.  Questions were answered to the patient's satisfaction.     Purcell Nails

## 2019-10-27 NOTE — Op Note (Signed)
HEART AND VASCULAR CENTER   MULTIDISCIPLINARY HEART VALVE TEAM   TAVR OPERATIVE NOTE   Date of Procedure:  10/27/2019  Preoperative Diagnosis: Severe Aortic Stenosis   Postoperative Diagnosis: Same   Procedure:    Transcatheter Aortic Valve Replacement - Percutaneous Right Transfemoral Approach  Edwards Sapien 3 Ultra THV (size 26 mm, model # [id number], serial # [id number])   Co-Surgeons:  Salvatore Decent. Cornelius Moras, MD and Verne Carrow, MD  Anesthesiologist:  Kipp Brood, MD  Echocardiographer:  Thurmon Fair, MD  Pre-operative Echo Findings:  Severe aortic stenosis  Normal left ventricular systolic function  Post-operative Echo Findings:  No paravalvular leak  Normal left ventricular systolic function   BRIEF CLINICAL NOTE AND INDICATIONS FOR SURGERY  Patient is an 86 year old male with history of coronary artery disease status post coronary artery bypass grafting in 1996 and recently status post PCI and stenting, aortic stenosis, hypertension, peripheral arterial disease with left subclavian stenosis, remote history of stroke, hyperlipidemia, peripheral neuropathy with bilateral foot drop, and mild dementia who has been referred for surgical consultation to discuss treatment options for management of severe symptomatic aortic stenosis.  Patient's cardiac history dates back to 1996 when he presented with symptomatic coronary artery disease.  He underwent coronary artery bypass grafting x5 and did very well.  He has developed aortic stenosis that gradually progressed in severity on follow-up imaging.  In March 2021 he was hospitalized with atypical chest pain that was felt likely related to shingles.  He recently underwent follow-up echocardiogram that revealed significant progression and severity of aortic stenosis with preserved left ventricular systolic function.  He was seen in follow-up by Dr. Bing Matter and referred to the multidisciplinary heart valve clinic where he was  evaluated by Dr. Clifton James.  He subsequently underwent diagnostic cardiac catheterization on October 07, 2019.  He was noted to have severe native coronary artery disease with chronic total occlusion of the left anterior descending coronary artery, left circumflex coronary artery, and the right coronary arteries.  He had continued patency of the greater saphenous vein grafts to the diagonal branch which filled the left anterior descending coronary artery, the obtuse marginal branch, and sequential saphenous vein grafts to both the posterior descending and the posterior lateral branches.  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  There was significant high-grade stenosis in the left anterior descending coronary artery just beyond the diagonal vein graft insertion site.  He underwent successful PCI and stenting of the left anterior descending coronary artery at that time.  Catheterization also confirmed the presence of aortic stenosis and revealed moderately elevated right-sided pressures.  CT angiography was performed and the patient referred for surgical consultation.  During the course of the patient's preoperative work up they have been evaluated comprehensively by a multidisciplinary team of specialists coordinated through the Multidisciplinary Heart Valve Clinic in the [facility].  They have been demonstrated to suffer from symptomatic severe aortic stenosis as noted above. The patient has been counseled extensively as to the relative risks and benefits of all options for the treatment of severe aortic stenosis including long term medical therapy, conventional surgery for aortic valve replacement, and transcatheter aortic valve replacement.  All questions have been answered, and the patient provides full informed consent for the operation as described.   DETAILS OF THE OPERATIVE PROCEDURE  PREPARATION:    The patient is brought to the operating room on the above  mentioned date and appropriate monitoring was established by the anesthesia team. The  patient is placed in the supine position on the operating table.  Intravenous antibiotics are administered. The patient is monitored closely throughout the procedure under conscious sedation.  Baseline transthoracic echocardiogram was performed. The patient's chest, abdomen, both groins, and both lower extremities are prepared and draped in a sterile manner. A time out procedure is performed.   TEMPORARY TRANSVENOUS PACEMAKER INSERTION:    The right internal jugular vein was cannulated using Seldinger technique under ultrasound guidance and a 6 French sheath inserted into the internal jugular vein.  A Tempo temporary transvenous pacemaker catheter was passed through the right internal jugular venous sheath under fluoroscopic guidance into the right ventricle.  Once appropriate position of the pacemaker tip was verified under fluoroscopy the lead was fixed in place.  The pacemaker was tested to ensure stable lead placement and pacemaker capture.    PERIPHERAL ACCESS:    Using the modified Seldinger technique, femoral arterial access was obtained with placement of 6 Fr sheaths on the left side.  A pigtail diagnostic catheter was passed through the left femoral arterial sheath under fluoroscopic guidance into the aortic root.  Aortic root angiography was performed in order to determine the optimal angiographic angle for valve deployment.   TRANSFEMORAL ACCESS:   Percutaneous transfemoral access and sheath placement was performed using ultrasound guidance.  The right common femoral artery was cannulated using a micropuncture needle and appropriate location was verified using hand injection angiogram.  A pair of Abbott Perclose percutaneous closure devices were placed and a 6 French sheath replaced into the femoral artery.  The patient was heparinized systemically and ACT verified > 250 seconds.    A 14 Fr  transfemoral E-sheath was introduced into the right common femoral artery after progressively dilating over an Amplatz superstiff wire. An AL-2 catheter was used to direct a straight-tip exchange length wire across the native aortic valve into the left ventricle. This was exchanged out for a pigtail catheter and position was confirmed in the LV apex. Simultaneous LV and Ao pressures were recorded.  The pigtail catheter was exchanged for an Amplatz Extra-stiff wire in the LV apex.  Echocardiography was utilized to confirm appropriate wire position and no sign of entanglement in the mitral subvalvular apparatus.   TRANSCATHETER HEART VALVE DEPLOYMENT:   An Edwards Sapien 3 Ultra transcatheter heart valve (size 26 mm, model #[id number], serial #[id number]) was prepared and crimped per manufacturer's guidelines, and the proper orientation of the valve is confirmed on the [employer] delivery system. The valve was advanced through the introducer sheath using normal technique until in an appropriate position in the abdominal aorta beyond the sheath tip. The balloon was then retracted and using the fine-tuning wheel was centered on the valve. The valve was then advanced across the aortic arch using appropriate flexion of the catheter. The valve was carefully positioned across the aortic valve annulus. The Commander catheter was retracted using normal technique. Once final position of the valve has been confirmed by angiographic assessment, the valve is deployed while temporarily holding ventilation and during rapid ventricular pacing to maintain systolic blood pressure < 50 mmHg and pulse pressure < 10 mmHg. The balloon inflation is held for >3 seconds after reaching full deployment volume. Once the balloon has fully deflated the balloon is retracted into the ascending aorta and valve function is assessed using echocardiography. There is felt to be no paravalvular leak and no central aortic insufficiency.  The  patient's hemodynamic recovery following valve deployment is good.  The deployment  balloon and guidewire are both removed.    PROCEDURE COMPLETION:   The sheath was removed and femoral artery closure performed.  Protamine was administered once femoral arterial repair was complete. The pigtail catheters and femoral sheaths were removed with manual pressure used for hemostasis.  A Mynx femoral closure device was utilized following removal of the diagnostic sheath in the left femoral artery.  The temporary transvenous pacemaker was left in place.  The patient tolerated the procedure well and is transported to the surgical intensive care in stable condition. There were no immediate intraoperative complications. All sponge instrument and needle counts are verified correct at completion of the operation.   No blood products were administered during the operation.  The patient received a total of 55 mL of intravenous contrast during the procedure.   Purcell Nails, MD 10/27/2019 9:49 AM

## 2019-10-27 NOTE — Transfer of Care (Signed)
Immediate Anesthesia Transfer of Care Note  Patient: Jack Hodges  Procedure(s) Performed: TRANSCATHETER AORTIC VALVE REPLACEMENT, TRANSFEMORAL (N/A ) TRANSESOPHAGEAL ECHOCARDIOGRAM (TEE) (N/A )  Patient Location: PACU and Cath Lab  Anesthesia Type:MAC  Level of Consciousness: drowsy, patient cooperative and responds to stimulation  Airway & Oxygen Therapy: Patient Spontanous Breathing and Patient connected to nasal cannula oxygen  Post-op Assessment: Report given to RN and Post -op Vital signs reviewed and stable  Post vital signs: Reviewed and stable  Last Vitals:  Vitals Value Taken Time  BP    Temp    Pulse    Resp    SpO2      Last Pain:  Vitals:   10/27/19 0631  TempSrc:   PainSc: 0-No pain      Patients Stated Pain Goal: 3 (44/96/75 [id number])  Complications: No complications documented.

## 2019-10-27 NOTE — CV Procedure (Signed)
HEART AND VASCULAR CENTER  TAVR OPERATIVE NOTE   Date of Procedure:  10/27/2019  Preoperative Diagnosis: Severe Aortic Stenosis   Postoperative Diagnosis: Same   Procedure:    Transcatheter Aortic Valve Replacement - Transfemoral Approach  Edwards Sapien 3 THV (size 26 mm, model # [id number], serial # [id number])   Co-Surgeons:  Lauree Chandler, MD and Valentina Gu. Roxy Manns, MD   Anesthesiologist:  Linna Caprice  Echocardiographer:  Croitoru  Pre-operative Echo Findings:  Severe aortic stenosis  Normal left ventricular systolic function  Post-operative Echo Findings:  No paravalvular leak  Normal left ventricular systolic function  BRIEF CLINICAL NOTE AND INDICATIONS FOR SURGERY   86 yo male with history of CAD s/p CABG in 1996, PAD, remote CVA, sleep apnea, HTN, HLD and severe aortic stenosis who is here today for TAVR. He underwent bypass surgery in 1996. No cardiac cath since then. He has PAD and last non-invasive imaging in 2021 showed likely severe disease in the left superficial femoral artery. His aortic stenosis has been followed by Dr. Agustin Cree. He was admitted to [facility] in March 2021 with chest pain felt to be due to shingles. I met him then. He did not wish to consider TAVR at that time. Plans at that time were for repeat echo in May and follow up with primary cardiology. Most recent echo 08/03/19 with LVEF=60-65%, moderate LVH. There is mild mitral regurgitation. The aortic valve is thickened and calcified with limited leaflet excursion. Mean gradient 31 mmHg, peak gradient 53 mmHg, AVA 0.72 cm2 and dimensionless index 0.24.   During the course of the patient's preoperative work up they have been evaluated comprehensively by a multidisciplinary team of specialists coordinated through the [facility] in the [facility].  They have been demonstrated to suffer from symptomatic severe aortic stenosis as noted above. The patient has  been counseled extensively as to the relative risks and benefits of all options for the treatment of severe aortic stenosis including long term medical therapy, conventional surgery for aortic valve replacement, and transcatheter aortic valve replacement.  The patient has been independently evaluated by Dr. Roxy Manns with CT surgery and they are felt to be at high risk for conventional surgical aortic valve replacement. The surgeon indicated the patient would be a poor candidate for conventional surgery. Based upon review of all of the patient's preoperative diagnostic tests they are felt to be candidate for transcatheter aortic valve replacement using the transfemoral approach as an alternative to high risk conventional surgery.    Following the decision to proceed with transcatheter aortic valve replacement, a discussion has been held regarding what types of management strategies would be attempted intraoperatively in the event of life-threatening complications, including whether or not the patient would be considered a candidate for the use of cardiopulmonary bypass and/or conversion to open sternotomy for attempted surgical intervention.  The patient has been advised of a variety of complications that might develop peculiar to this approach including but not limited to risks of death, stroke, paravalvular leak, aortic dissection or other major vascular complications, aortic annulus rupture, device embolization, cardiac rupture or perforation, acute myocardial infarction, arrhythmia, heart block or bradycardia requiring permanent pacemaker placement, congestive heart failure, respiratory failure, renal failure, pneumonia, infection, other late complications related to structural valve deterioration or migration, or other complications that might ultimately cause a temporary or permanent loss of functional independence or other long term morbidity.  The patient provides full informed consent for the procedure as  described and all questions were answered preoperatively.    DETAILS OF THE OPERATIVE PROCEDURE  PREPARATION:   The patient is brought to the operating room on the above mentioned date and central monitoring was established by the anesthesia team including placement of a radial arterial line. The patient is placed in the supine position on the operating table.  Intravenous antibiotics are administered. Conscious sedation is used.   Baseline transthoracic echocardiogram was performed. The patient's chest, abdomen, both groins, and both lower extremities are prepared and draped in a sterile manner. A time out procedure is performed.   PERIPHERAL ACCESS:   The right neck was prepped and draped. A 6 french sheath was placed in the right IJ using u/s guidance. A Tempo temporary pacing wire was advanced into the RV. The pacemaker was tested to ensure stable lead placement and pacemaker capture. Using the modified Seldinger technique, femoral arterial access were obtained with placement of a 6 Fr sheaths on the left side using u/s guidance.  A pigtail diagnostic catheter was passed through the femoral arterial sheath under fluoroscopic guidance into the aortic root. Aortic root angiography was performed in order to determine the optimal angiographic angle for valve deployment.  TRANSFEMORAL ACCESS:  A micropuncture kit was used to gain access to the right femoral artery using u/s guidance. Position confirmed with angiography. Pre-closure with double ProGlide closure devices. The patient was heparinized systemically and ACT verified > 250 seconds.    A 14Fr transfemoral E-sheath was introduced into the right femoral artery after progressively dilating over an Amplatz superstiff wire. An AL-2 catheter was used to direct a straight-tip exchange length wire across the native aortic valve into the left ventricle. This was exchanged out for a pigtail catheter and position was confirmed in the LV apex.  Simultaneous LV and Ao pressures were recorded.  The pigtail catheter was then exchanged for an Amplatz Extra-stiff wire in the LV apex.   TRANSCATHETER HEART VALVE DEPLOYMENT:  An Edwards Sapien 3 THV (size 26 mm) was prepared and crimped per manufacturer's guidelines, and the proper orientation of the valve is confirmed on the [employer] delivery system. The valve was advanced through the introducer sheath using normal technique until in an appropriate position in the abdominal aorta beyond the sheath tip. The balloon was then retracted and using the fine-tuning wheel was centered on the valve. The valve was then advanced across the aortic arch using appropriate flexion of the catheter. The valve was carefully positioned across the aortic valve annulus. The Commander catheter was retracted using normal technique. Once final position of the valve has been confirmed by angiographic assessment, the valve is deployed while temporarily holding ventilation and during rapid ventricular pacing to maintain systolic blood pressure < 50 mmHg and pulse pressure < 10 mmHg. The balloon inflation is held for >3 seconds after reaching full deployment volume. Once the balloon has fully deflated the balloon is retracted into the ascending aorta and valve function is assessed using TTE. There is felt to be no paravalvular leak and no central aortic insufficiency.  The patient's hemodynamic recovery following valve deployment is good.  The deployment balloon and guidewire are both removed. Echo demostrated acceptable post-procedural gradients, stable mitral valve function, and no AI.   PROCEDURE COMPLETION:  The sheath was then removed and closure devices were completed. Protamine was administered once femoral arterial repair was complete. The pigtail catheter and femoral sheath was removed with a Mynx closure devices placed in the left femoral artery.  The temporary pacing wire was left in the RV.   The patient  tolerated the procedure well and is transported to the surgical intensive care in stable condition. There were no immediate intraoperative complications. All sponge instrument and needle counts are verified correct at completion of the operation.   No blood products were administered during the operation.  The patient received a total of 60 mL of intravenous contrast during the procedure.  Lauree Chandler MD 10/27/2019 9:43 AM

## 2019-10-27 NOTE — Anesthesia Procedure Notes (Signed)
Arterial Line Insertion Performed by: Rosalio Macadamia, CRNA, CRNA  Patient location: Pre-op. Preanesthetic checklist: patient identified, IV checked, risks and benefits discussed, surgical consent, monitors and equipment checked and pre-op evaluation Lidocaine 1% used for infiltration Right, radial was placed Catheter size: 20 G Hand hygiene performed  and maximum sterile barriers used  Allen's test indicative of satisfactory collateral circulation Attempts: 1 Procedure performed without using ultrasound guided technique. Following insertion, dressing applied and Biopatch. Post procedure assessment: normal  Patient tolerated the procedure well with no immediate complications.

## 2019-10-27 NOTE — Discharge Instructions (Signed)
ACTIVITY AND EXERCISE °• Daily activity and exercise are an important part of your recovery. People recover at different rates depending on their general health and type of valve procedure. °• Most people recovering from TAVR feel better relatively quickly  °• No lifting, pushing, pulling more than 10 pounds (examples to avoid: groceries, vacuuming, gardening, golfing): °            - For one week with a procedure through the groin. °            - For six weeks for procedures through the chest wall or neck. °NOTE: You will typically see one of our providers 7-14 days after your procedure to discuss WHEN TO RESUME the above activities.  °  °  °DRIVING °• Do not drive until you are seen for follow up and cleared by a provider. Generally, we ask patient to not drive for 1 week after their procedure. °• If you have been told by your doctor in the past that you may not drive, you must talk with him/her before you begin driving again. °  °DRESSING °• Groin site: you may leave the clear dressing over the site for up to one week or until it falls off. °  °HYGIENE °• If you had a femoral (leg) procedure, you may take a shower when you return home. After the shower, pat the site dry. Do NOT use powder, oils or lotions in your groin area until the site has completely healed. °• If you had a chest procedure, you may shower when you return home unless specifically instructed not to by your discharging practitioner. °            - DO NOT scrub incision; pat dry with a towel. °            - DO NOT apply any lotions, oils, powders to the incision. °            - No tub baths / swimming for at least 2 weeks. °• If you notice any fevers, chills, increased pain, swelling, bleeding or pus, please contact your doctor. °  °ADDITIONAL INFORMATION °• If you are going to have an upcoming dental procedure, please contact our office as you will require antibiotics ahead of time to prevent infection on your heart valve.  ° ° °If you have any  questions or concerns you can call the structural heart phone during normal business hours 8am-4pm. If you have an urgent need after hours or weekends please call [phone] to talk to the on call provider for general cardiology. If you have an emergency that requires immediate attention, please call 911.  ° ° °After TAVR Checklist ° °Check  Test Description  ° Follow up appointment in 1-2 weeks  You will see our structural heart physician assistant, Jack Hodges. Your incision sites will be checked and you will be cleared to drive and resume all normal activities if you are doing well.    ° 1 month echo and follow up  You will have an echo to check on your new heart valve and be seen back in the office by Jack Hodges. Many times the echo is not read by your appointment time, but Jack will call you later that day or the following day to report your results.  ° Follow up with your primary cardiologist You will need to be seen by your primary cardiologist in the following 3-6 months after your 1 month appointment in the valve   clinic. Often times your Plavix or Aspirin will be discontinued during this time, but this is decided on a case by case basis.   ° 1 year echo and follow up You will have another echo to check on your heart valve after 1 year and be seen back in the office by Jack Hodges. This your last structural heart visit.  ° Bacterial endocarditis prophylaxis  You will have to take antibiotics for the rest of your life before all dental procedures (even teeth cleanings) to protect your heart valve. Antibiotics are also required before some surgeries. Please check with your cardiologist before scheduling any surgeries. Also, please make sure to tell us if you have a penicillin allergy as you will require an alternative antibiotic.   ° ° °

## 2019-10-27 NOTE — Progress Notes (Signed)
TCTS BRIEF SICU PROGRESS NOTE  Day of Surgery  S/P Procedure(s) (LRB): TRANSCATHETER AORTIC VALVE REPLACEMENT, TRANSFEMORAL (N/A) TRANSESOPHAGEAL ECHOCARDIOGRAM (TEE) (N/A)   Doing well.  No pain or SOB NSR w/ no evidence of increased AV block or need for temporary pacing. Both groins look okay  Plan: Continue routine post TAVR.  Will leave Tempo transvenous pacing wire in place overnight.  Purcell Nails, MD 10/27/2019 3:48 PM

## 2019-10-27 NOTE — Progress Notes (Signed)
  Echocardiogram 2D Echocardiogram has been performed.  Jack Hodges 10/27/2019, 9:39 AM

## 2019-10-27 NOTE — Anesthesia Procedure Notes (Signed)
Procedure Name: MAC Date/Time: 10/27/2019 8:00 AM Performed by: Janace Litten, CRNA Pre-anesthesia Checklist: Patient identified, Emergency Drugs available, Suction available and Patient being monitored Patient Re-evaluated:Patient Re-evaluated prior to induction Oxygen Delivery Method: Simple face mask

## 2019-10-27 NOTE — Anesthesia Preprocedure Evaluation (Signed)
Anesthesia Evaluation  Patient identified by MRN, date of birth, ID band Patient awake    Reviewed: Allergy & Precautions, NPO status , Patient's Chart, lab work & pertinent test results  Airway Mallampati: III  TM Distance: >3 FB Neck ROM: Full    Dental  (+) Teeth Intact, Dental Advisory Given   Pulmonary former smoker,    breath sounds clear to auscultation       Cardiovascular hypertension,  Rhythm:Regular Rate:Normal + Systolic murmurs    Neuro/Psych    GI/Hepatic   Endo/Other    Renal/GU      Musculoskeletal   Abdominal   Peds  Hematology   Anesthesia Other Findings   Reproductive/Obstetrics                             Anesthesia Physical Anesthesia Plan  ASA: III  Anesthesia Plan: MAC   Post-op Pain Management:    Induction: Intravenous  PONV Risk Score and Plan: Ondansetron and Dexamethasone  Airway Management Planned: Natural Airway and Simple Face Mask  Additional Equipment: Arterial line  Intra-op Plan:   Post-operative Plan:   Informed Consent: I have reviewed the patients History and Physical, chart, labs and discussed the procedure including the risks, benefits and alternatives for the proposed anesthesia with the patient or authorized representative who has indicated his/her understanding and acceptance.     Dental advisory given  Plan Discussed with: CRNA and Anesthesiologist  Anesthesia Plan Comments:         Anesthesia Quick Evaluation

## 2019-10-27 NOTE — Progress Notes (Signed)
Patient has entered new rhythm of trigeminy

## 2019-10-27 NOTE — Anesthesia Postprocedure Evaluation (Signed)
Anesthesia Post Note  Patient: Jack Hodges  Procedure(s) Performed: TRANSCATHETER AORTIC VALVE REPLACEMENT, TRANSFEMORAL (N/A ) TRANSESOPHAGEAL ECHOCARDIOGRAM (TEE) (N/A )     Patient location during evaluation: SICU Anesthesia Type: MAC Level of consciousness: awake and alert Pain management: pain level controlled Vital Signs Assessment: post-procedure vital signs reviewed and stable Respiratory status: spontaneous breathing, nonlabored ventilation, respiratory function stable and patient connected to nasal cannula oxygen Cardiovascular status: stable and blood pressure returned to baseline Postop Assessment: no apparent nausea or vomiting Anesthetic complications: no   No complications documented.  Last Vitals:  Vitals:   10/27/19 1330 10/27/19 1345  BP:    Pulse: (!) 48 (!) 53  Resp: (!) 9 10  Temp:    SpO2: 99% 96%    Last Pain:  Vitals:   10/27/19 1115  TempSrc:   PainSc: 0-No pain                 Demitra Danley COKER

## 2019-10-28 ENCOUNTER — Inpatient Hospital Stay (HOSPITAL_COMMUNITY): Payer: Medicare Other

## 2019-10-28 ENCOUNTER — Encounter (HOSPITAL_COMMUNITY): Payer: Self-pay | Admitting: Cardiovascular Disease

## 2019-10-28 ENCOUNTER — Ambulatory Visit (INDEPENDENT_AMBULATORY_CARE_PROVIDER_SITE_OTHER): Payer: Medicare Other

## 2019-10-28 DIAGNOSIS — Z954 Presence of other heart-valve replacement: Secondary | ICD-10-CM

## 2019-10-28 DIAGNOSIS — Z952 Presence of prosthetic heart valve: Secondary | ICD-10-CM

## 2019-10-28 DIAGNOSIS — I440 Atrioventricular block, first degree: Secondary | ICD-10-CM

## 2019-10-28 DIAGNOSIS — I4510 Unspecified right bundle-branch block: Secondary | ICD-10-CM

## 2019-10-28 DIAGNOSIS — I350 Nonrheumatic aortic (valve) stenosis: Secondary | ICD-10-CM

## 2019-10-28 LAB — ECHOCARDIOGRAM LIMITED
AR max vel: 2.43 cm2
AV Area VTI: 2.44 cm2
AV Area mean vel: 2.29 cm2
AV Mean grad: 9 mmHg
AV Peak grad: 16.6 mmHg
Ao pk vel: 2.04 m/s
Area-P 1/2: 5.02 cm2
Height: 66 in
S' Lateral: 2.78 cm
Weight: 2818.36 oz

## 2019-10-28 LAB — CBC
HCT: 32 % — ABNORMAL LOW (ref 39.0–52.0)
Hemoglobin: 10.8 g/dL — ABNORMAL LOW (ref 13.0–17.0)
MCH: 30.3 pg (ref 26.0–34.0)
MCHC: 33.8 g/dL (ref 30.0–36.0)
MCV: 89.6 fL (ref 80.0–100.0)
Platelets: 77 10*3/uL — ABNORMAL LOW (ref 150–400)
RBC: 3.57 MIL/uL — ABNORMAL LOW (ref 4.22–5.81)
RDW: 14.2 % (ref 11.5–15.5)
WBC: 7.9 10*3/uL (ref 4.0–10.5)
nRBC: 0 % (ref 0.0–0.2)

## 2019-10-28 LAB — BASIC METABOLIC PANEL
Anion gap: 8 (ref 5–15)
BUN: 7 mg/dL — ABNORMAL LOW (ref 8–23)
CO2: 22 mmol/L (ref 22–32)
Calcium: 8.8 mg/dL — ABNORMAL LOW (ref 8.9–10.3)
Chloride: 107 mmol/L (ref 98–111)
Creatinine, Ser: 0.74 mg/dL (ref 0.61–1.24)
GFR calc Af Amer: >60 mL/min (ref >60)
GFR calc non Af Amer: >60 mL/min (ref >60)
Glucose, Bld: 97 mg/dL (ref 70–99)
Potassium: 3.6 mmol/L (ref 3.5–5.1)
Sodium: 137 mmol/L (ref 135–145)

## 2019-10-28 LAB — MAGNESIUM: Magnesium: 1.7 mg/dL (ref 1.7–2.4)

## 2019-10-28 MED ORDER — FUROSEMIDE 40 MG PO TABS
40.0000 mg | ORAL_TABLET | Freq: Every day | ORAL | Status: DC
  Administered 2019-10-28: 40 mg via ORAL
  Filled 2019-10-28: qty 1

## 2019-10-28 MED ORDER — POTASSIUM CHLORIDE CRYS ER 10 MEQ PO TBCR
10.0000 meq | EXTENDED_RELEASE_TABLET | Freq: Every day | ORAL | Status: DC
  Administered 2019-10-28: 10 meq via ORAL
  Filled 2019-10-28: qty 1

## 2019-10-28 MED ORDER — POTASSIUM CHLORIDE CRYS ER 20 MEQ PO TBCR
40.0000 meq | EXTENDED_RELEASE_TABLET | Freq: Once | ORAL | Status: AC
  Administered 2019-10-28: 40 meq via ORAL
  Filled 2019-10-28: qty 2

## 2019-10-28 NOTE — Progress Notes (Addendum)
[address]       [place] [address]             [phone]        CARDIOTHORACIC SURGERY PROGRESS NOTE   R1 Day Post-Op Procedure(s) (LRB): TRANSCATHETER AORTIC VALVE REPLACEMENT, TRANSFEMORAL (N/A) TRANSESOPHAGEAL ECHOCARDIOGRAM (TEE) (N/A)  Subjective: Looks great.  Up in chair eating breakfast.  Already ambulated.  No pain or SOB  Objective: Vital signs: BP Readings from Last 1 Encounters:  10/28/19 (!) 131/59   Pulse Readings from Last 1 Encounters:  10/28/19 65   Resp Readings from Last 1 Encounters:  10/28/19 19   Temp Readings from Last 1 Encounters:  10/28/19 98.7 F (37.1 C) (Oral)    Hemodynamics:    Physical Exam:  Rhythm:   Sinus - no episodes of advanced AV block   Breath sounds: clear  Heart sounds:  RRR  Incisions:  Both groins look good  Abdomen:  Soft, non-distended, non-tender  Extremities:  Warm, well-perfused    Intake/Output from previous day: 08/03 0701 - 08/04 0700 In: 2847.6 [P.O.:720; I.V.:1677.6; IV Piggyback:450] Out: 700 [Urine:650; Blood:50] Intake/Output this shift: No intake/output data recorded.  Lab Results:  CBC: Recent Labs    10/27/19 0952 10/28/19 0520  WBC  --  7.9  HGB 9.5* 10.8*  HCT 28.0* 32.0*  PLT  --  77*    BMET:  Recent Labs    10/27/19 0555 10/27/19 0810 10/27/19 0952 10/28/19 0520  NA 138   < > 143 137  K 3.3*   < > 3.5 3.6  CL 105   < > 105 107  CO2 24  --   --  22  GLUCOSE 100*   < > 111* 97  BUN 12   < > 11 7*  CREATININE 1.02   < > 0.80 0.74  CALCIUM 9.4  --   --  8.8*   < > = values in this interval not displayed.     PT/INR:  No results for input(s): LABPROT, INR in the last 72 hours.  CBG (last 3)  No results for input(s): GLUCAP in the last 72 hours.  ABG    Component Value Date/Time   PHART 7.435 10/23/2019 1005   PCO2ART 33.9 10/23/2019 1005   PO2ART 117 (H) 10/23/2019 1005   HCO3 22.3 10/23/2019 1005   TCO2 25 10/27/2019 0952    ACIDBASEDEF 1.3 10/23/2019 1005   O2SAT 98.8 10/23/2019 1005    CXR: PORTABLE CHEST 1 VIEW  COMPARISON:  Prior chest radiograph 10/23/2019  FINDINGS: Right IJ approach Swan-Ganz catheter with tip projecting in the region of the right atrium or ventricle. Prior median sternotomy and CABG. Interval TAVR. Heart size at the upper limits of normal. Aortic atherosclerosis. No appreciable airspace consolidation or pulmonary edema. No evidence of pleural effusion or pneumothorax. No acute bony abnormality identified.  IMPRESSION: Interval TAVR.  No airspace consolidation or pulmonary edema.  Right IJ approach Swan-Ganz catheter with tip projecting in the region of the right atrium or ventricle.  Aortic Atherosclerosis (ICD10-I70.0).   Electronically Signed   By: Jackey Loge DO   On: 10/27/2019 11:09    EKG: NSR w/ RBBB    Assessment/Plan: S/P Procedure(s) (LRB): TRANSCATHETER AORTIC VALVE REPLACEMENT, TRANSFEMORAL (N/A) TRANSESOPHAGEAL ECHOCARDIOGRAM (TEE) (N/A)  Doing well s/p TAVR Chronic RBBB and LAFB but no changes since preop and no episodes advanced AV block   Routine POD1 echo this morning  Temporary pacing wire  removed  Tentatively plan d/c home later today w/ ZIO patch for monitoring as long as echo looks okay  Resume all preop meds at discharge including DAPT  Follow up in clinic 1 week  Purcell Nails, MD 10/28/2019 8:34 AM

## 2019-10-28 NOTE — Plan of Care (Signed)

## 2019-10-28 NOTE — Plan of Care (Signed)
  Problem: Clinical Measurements: Goal: Ability to maintain clinical measurements within normal limits will improve Outcome: Completed/Met Goal: Will remain free from infection Outcome: Completed/Met Goal: Diagnostic test results will improve Outcome: Completed/Met Goal: Respiratory complications will improve Outcome: Completed/Met Goal: Cardiovascular complication will be avoided Outcome: Completed/Met   

## 2019-10-28 NOTE — Progress Notes (Signed)
Pt given discharge instructions with understanding. Pt has no questions at this time. Son here to take pt home. IV and monitor d/c.

## 2019-10-28 NOTE — Progress Notes (Signed)
Zio patch placed onto patient.  All instructions and information reviewed with patient, they verbalize understanding with no questions. 

## 2019-10-28 NOTE — Progress Notes (Signed)
CARDIAC REHAB PHASE I   PRE:  Rate/Rhythm: 68 SR    BP: sitting 161/67    SaO2: 97 RA  MODE:  Ambulation: 370 ft   POST:  Rate/Rhythm: 93 SR    BP: sitting 159/68     SaO2: 95 RA  Donned shoes and AFO. Pt assisted. Pt stood independently and ambulated with his cane, min assist with gait belt. Steady, no real assist needed. He has significant foot drop but able to compensate. Denied SOB. Return to recliner. Not interested in CRPII however will refer to [facility]. Discussed restrictions. [id number]   Harriet Masson CES, ACSM 10/28/2019 11:55 AM

## 2019-10-28 NOTE — Progress Notes (Signed)
  Echocardiogram 2D Echocardiogram limited post TAVR has been performed.  Jack Hodges M 10/28/2019, 9:55 AM

## 2019-10-29 ENCOUNTER — Telehealth: Payer: Self-pay

## 2019-10-29 DIAGNOSIS — I4510 Unspecified right bundle-branch block: Secondary | ICD-10-CM | POA: Diagnosis not present

## 2019-10-29 NOTE — Telephone Encounter (Signed)
Patient contacted regarding [facility] on 10/28/2019.  Patient understands to follow up with provider Carlean Jews PA-C on 11/05/2019 at 1:00 PM at [facility] office. Patient understands discharge instructions? yes Patient understands medications and regiment? yes Patient understands to bring all medications to this visit? Yes  The pt is feeling well today and has no questions or concerns at this time.

## 2019-10-30 NOTE — Discharge Summary (Signed)
Physician Discharge Summary  Patient ID: Jack Hodges MRN: [id number] DOB/AGE: 07/12/1930 86 y.o.  Admit date: 10/27/2019 Discharge date: 10/28/2019  Admission Diagnoses:  Patient Active Problem List   Diagnosis Date Noted  . Angina pectoris (HCC) 10/07/2019  . Atherosclerosis of aorta (HCC) 09/24/2019  . BMI 27.0-27.9,adult 09/24/2019  . Cellulitis of left hand 07/07/2019  . Recurrent falls 06/25/2019  . Severe aortic stenosis   . Other chest pain   . Herpes zoster 05/19/2019  . Dyspnea on exertion 06/30/2018  . Aortic stenosis 05/02/2017  . Bilateral foot-drop 01/18/2017  . 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 Dizziness 04/19/2016  . CVA (cerebral vascular accident) (HCC) 04/03/2016  . DNR (do not resuscitate) 04/03/2016  . Coronary artery disease involving native coronary artery of native heart without angina pectoris 11/01/2015  . Dyslipidemia 11/01/2015  . Essential hypertension 11/01/2015  . Status post coronary artery bypass graft 11/01/2015   Discharge Diagnoses:   Patient Active Problem List   Diagnosis Date Noted  . 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 Angina pectoris (HCC) 10/07/2019  . Atherosclerosis of aorta (HCC) 09/24/2019  . BMI 27.0-27.9,adult 09/24/2019  . Cellulitis of left hand 07/07/2019  . Recurrent falls 06/25/2019  . Severe aortic stenosis   . Other chest pain   . Herpes zoster 05/19/2019  . Dyspnea on exertion 06/30/2018  . Aortic stenosis 05/02/2017  . Bilateral foot-drop 01/18/2017  . 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 Dizziness 04/19/2016  . CVA (cerebral vascular accident) (HCC) 04/03/2016  . DNR (do not resuscitate) 04/03/2016  . Coronary artery disease involving native coronary artery of native heart without angina pectoris 11/01/2015  . Dyslipidemia 11/01/2015  . Essential hypertension 11/01/2015  . Status post coronary artery  bypass graft 11/01/2015   Discharged Condition: good  History of Present Illness:  Patient is an 86 year old male with history of coronary artery disease status post coronary artery bypass grafting in 1996 and recently status post PCI and stenting, aortic stenosis, hypertension, peripheral arterial disease with left subclavian stenosis, remote history of stroke, hyperlipidemia, peripheral neuropathy with bilateral foot drop, and mild dementia who has been referred for surgical consultation to discuss treatment options for management of severe symptomatic aortic stenosis.  Patient's cardiac history dates back to 1996 when he presented with symptomatic coronary artery disease.  He underwent coronary artery bypass grafting x5 and did very well.  He has developed aortic stenosis that gradually progressed in severity on follow-up imaging.  In March 2021 he was hospitalized with atypical chest pain that was felt likely related to shingles.  He recently underwent follow-up echocardiogram that revealed significant progression and severity of aortic stenosis with preserved left ventricular systolic function.  He was seen in follow-up by Dr. Bing Matter and referred to the multidisciplinary heart valve clinic where he was evaluated by Dr. Clifton James.  He subsequently underwent diagnostic cardiac catheterization on October 07, 2019.  He was noted to have severe native coronary artery disease with chronic total occlusion of the left anterior descending coronary artery, left circumflex coronary artery, and the right coronary arteries.  He had continued patency of the greater saphenous vein grafts to the diagonal branch which filled the left anterior descending coronary artery, the obtuse marginal branch, and sequential saphenous vein grafts to both the posterior descending and the posterior lateral branches.  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  There was significant high-grade stenosis in the left anterior  descending coronary artery just beyond the diagonal vein graft insertion site.  He underwent successful PCI and stenting of the left anterior descending coronary artery at that time.  Catheterization also confirmed the presence of aortic stenosis and revealed moderately elevated right-sided pressures.  CT angiography was performed and the patient referred for surgical consultation.  He was evaluated by Multidisciplinary heart team and he is felt to be a candidate for TAVR. The risks and benefits of the procedure were explained to the patient and he was agreeable to proceed.  Hospital Course:   Jack Hodges presented to [facility] on 10/27/2019.  He underwent Transcatheter Aortic Valve Replacement with a 26 mm Edwards Sapien 3 Ultra THV valve, via percutaneous approach.  He tolerated the procedure and was taken to the SICU in stable condition.  Patient did well post operatively.  His transvenous pacing wires was removed on POD #1.  He had some Trigeminy overnight and he underwent placement of a ZIO patch for outpatient monitoring.  He was restarted on all of his outpatient medications.  Follow up Echocardiogram was obtained and per Dr. Clifton James was okay.  He was medically stable for discharge home per Dr. Orvan July instruction     Significant Diagnostic Studies: cardiac graphics:   Echocardiogram:   1. Left ventricular ejection fraction, by estimation, is 65 to 70%. The  left ventricle has normal function. The left ventricle has no regional  wall motion abnormalities. There is mild concentric left ventricular  hypertrophy. Left ventricular diastolic  parameters are consistent with Grade II diastolic dysfunction  (pseudonormalization). Elevated left atrial pressure.  2. Right ventricular systolic function is normal. The right ventricular  size is normal. There is normal pulmonary artery systolic pressure. The  estimated right ventricular systolic pressure is 22.5 mmHg.  3. Left atrial size was  moderately dilated.  4. The mitral valve is normal in structure. Trivial mitral valve  regurgitation.  5. The aortic valve has been repaired/replaced. There is a 26 mm Edwards  Sapien prosthetic (TAVR) valve present in the aortic position. Procedure  Date: 10/27/2019. Aortic valve mean gradient measures 9.0 mmHg. Aortic  valve Vmax measures 2.04 m/s.  6. The inferior vena cava is normal in size with greater than 50%  respiratory variability, suggesting right atrial pressure of 3 mmHg.   Treatments:    Transcatheter Aortic Valve Replacement - Percutaneous Right Transfemoral Approach             Edwards Sapien 3 Ultra THV (size 26 mm, model # 9750TFX, serial # [id number]  Discharge Exam: (performed by Dr. Cornelius Moras) Blood pressure (!) 159/68, pulse 77, temperature 98.2 F (36.8 C), temperature source Oral, resp. rate (!) 24, height 5\' 6"  (1.676 m), weight 79.9 kg, SpO2 99 %.         Rhythm:                       Sinus - no episodes of advanced AV block              Breath sounds:            clear             Heart sounds:              RRR             Incisions:                     Both  groins look good             Abdomen:                    Soft, non-distended, non-tender             Extremities:                 Warm, well-perfused    Discharge disposition: 01-Home or Self Care   Discharge Instructions    Amb Referral to Cardiac Rehabilitation   Complete by: As directed    To [facility]   Diagnosis:  Valve Replacement PTCA     Valve: Aortic Comment - TAVR   After initial evaluation and assessments completed: Virtual Based Care may be provided alone or in conjunction with Phase 2 Cardiac Rehab based on patient barriers.: Yes     Allergies as of 10/28/2019      Reactions   Poison Oak Extract Rash      Medication List    TAKE these medications   acetaminophen 650 MG CR tablet Commonly known as: TYLENOL Take 650 mg by mouth every 8 (eight) hours as needed for pain.   amLODipine  10 MG tablet Commonly known as: NORVASC Take 1 tablet (10 mg total) by mouth daily.   aspirin EC 81 MG tablet Take 81 mg by mouth daily.   Blue-Emu Hemp 10 % cream Generic drug: trolamine salicylate Apply 1 application topically daily as needed for muscle pain (Hands).   clopidogrel 75 MG tablet Commonly known as: PLAVIX Take 1 tablet (75 mg total) by mouth daily with breakfast.   enalapril 20 MG tablet Commonly known as: VASOTEC Take 1 tablet (20 mg total) by mouth daily.   furosemide 40 MG tablet Commonly known as: LASIX Take 1 tablet (40 mg total) by mouth daily.   gabapentin 300 MG capsule Commonly known as: NEURONTIN Take 300 mg by mouth 2 (two) times daily.   nitroGLYCERIN 0.4 MG SL tablet Commonly known as: Nitrostat Place 1 tablet (0.4 mg total) under the tongue every 5 (five) minutes as needed for chest pain.   pantoprazole 40 MG tablet Commonly known as: PROTONIX Take 1 tablet (40 mg total) by mouth daily.   potassium chloride 10 MEQ tablet Commonly known as: KLOR-CON Take 10 mEq by mouth daily.   rosuvastatin 20 MG tablet Commonly known as: CRESTOR Take 1 tablet (20 mg total) by mouth at bedtime.   sertraline 50 MG tablet Commonly known as: ZOLOFT Take 1 tablet (50 mg total) by mouth in the morning.   vitamin B-12 500 MCG tablet Commonly known as: CYANOCOBALAMIN Take 500 mcg by mouth daily.        I discharged patient as instructed and have provided discharge summary via review of information in patient's hospital chart.  I however did not provide any direct patient care to Jack Hodges.  Jessy Cybulski, PA-C  10/30/2019, 8:29 AM

## 2019-11-05 ENCOUNTER — Other Ambulatory Visit: Payer: Self-pay

## 2019-11-05 ENCOUNTER — Ambulatory Visit: Payer: Medicare Other | Admitting: Physician Assistant

## 2019-11-05 ENCOUNTER — Encounter: Payer: Self-pay | Admitting: Physician Assistant

## 2019-11-05 VITALS — BP 122/68 | HR 67 | Ht 66.0 in | Wt 164.0 lb

## 2019-11-05 DIAGNOSIS — Z952 Presence of prosthetic heart valve: Secondary | ICD-10-CM | POA: Diagnosis not present

## 2019-11-05 DIAGNOSIS — I2510 Atherosclerotic heart disease of native coronary artery without angina pectoris: Secondary | ICD-10-CM

## 2019-11-05 DIAGNOSIS — I10 Essential (primary) hypertension: Secondary | ICD-10-CM

## 2019-11-05 DIAGNOSIS — I459 Conduction disorder, unspecified: Secondary | ICD-10-CM | POA: Diagnosis not present

## 2019-11-05 MED ORDER — AMOXICILLIN 500 MG PO TABS
ORAL_TABLET | ORAL | 11 refills | Status: DC
Start: 2019-11-05 — End: 2020-01-04

## 2019-11-05 NOTE — Progress Notes (Signed)
[facility]   MULTIDISCIPLINARY HEART VALVE CLINIC                                       Cardiology Office Note    Date:  11/05/2019   ID:  Jack Hodges, DOB [birth year], MRN [id number]  PCP:  Abigail Miyamoto, MD  Cardiologist:  Dr. Bing Matter / Dr. Cornelius Moras & Dr. Excell Seltzer (TAVR)  CC: TOC s/p TAVR  History of Present Illness:  Jack Hodges is a 86 y.o. male with a history of CAD s/p CABG in 1996 and recently status post PCI, HTN, PAD with left subclavian stenosis, remote history of stroke, HLD, peripheral neuropathy with bilateral foot drop, mild dementia, RBBB, 1st deg AV block, and severe AS s/p TAVR (10/27/19) who presents to clinic for follow up.   Patient's cardiac history dates back to 1996 when he presented with symptomatic coronary artery disease. He underwent coronary artery bypass grafting x5 and did very well. He has developed aortic stenosis that gradually progressed in severity on follow-up imaging. In March 2021 he was hospitalized with atypical chest pain that was felt likely related to shingles. He recently underwent follow-up echocardiogram that revealed significant progression and severity of aortic stenosis with preserved left ventricular systolic function. He was seen in follow-up by Dr. Bing Matter and referred to the multidisciplinary heart valve clinic where he was evaluated by Dr. Clifton James. He subsequently underwent diagnostic cardiac catheterization on October 07, 2019. He was noted to have severe native coronary artery disease with chronic total occlusion of the left anterior descending coronary artery, left circumflex coronary artery, and the right coronary arteries. He had continued patency of the greater saphenous vein grafts to the diagonal branch which filled the left anterior descending coronary artery, the obtuse marginal branch, and sequential saphenous vein grafts to both the posterior descending and the posterior lateral branches. Left internal  mammary artery was chronically occluded. There was significant high-grade stenosis in the left anterior descending coronary artery just beyond the diagonal vein graft insertion site. He underwent successful PCI and stenting of the left anterior descending coronary artery at that time. Catheterization also confirmed the presence of aortic stenosis and revealed moderately elevated right-sided pressures.   He was evaluated by the multidisciplinary valve team and underwent successful TAVR with a 26 mm Edwards Sapien 3 THV via the TF approach on 10/27/19. Post operative echo showed EF 65%, normally functioning TAVR with a mean gradient of 9 mmHg and no PVL. He was continued on aspirin and plavix. Given underlying conduction dz, he was discharged with a Zio patch.   Today he presents to clinic for follow up. Here with son.  No CP or SOB. No LE edema, orthopnea or PND. No dizziness or syncope. No blood in stool or urine. No palpitations. Feeling better in terms of breathing since TAVR.   Past Medical History:  Diagnosis Date  . Arthritis   . Bilateral foot-drop 01/18/2017  . Cellulitis of left hand 07/07/2019  . Coronary artery disease involving native coronary artery of native heart without angina pectoris 11/01/2015   Overview:  Bypass surgery in 1996 Overview:  Overview:  Bypass surgery in 1996  Last Assessment & Plan:  Continue his current regimen.  Follow up as noted.  . CVA (cerebral vascular accident) (HCC) 04/03/2016   Last Assessment & Plan:  The patient underwent extensive CVA workup.  MRI  is negative.  He does have aortic stenosis by echocardiogram.  Upon review of Care everywhere he is followed by Dr. Bing Matter, cardiologist for this.  Carotid ultrasound negative for hemodynamically significant stenosis.  Lipid panel within normal limits.  He has not had any further episodes of dizziness or nausea vomiting.   . Dementia (HCC)   . Depression   . Dizziness 04/19/2016  . DNR (do not resuscitate)  04/03/2016   Overview:  I had an extensive discussion with the patient on 04/03/2016 regarding his end of life wishes.  He and his daughter agree that do not resuscitate is in keeping with his beliefs.  . Dyslipidemia 11/01/2015   Last Assessment & Plan:  Formatting of this note might be different from the original. Cholesterol 136   Triglycerides  83  HDL  50.1  LDL Calculated  69  VLDL Cholesterol Cal  17   Continue statin.  Marland Kitchen Dyspnea    with activity  . Essential hypertension 11/01/2015   Last Assessment & Plan:  Resume home medications.  Follow-up as noted above.  Marland Kitchen GERD (gastroesophageal reflux disease)   . Herpes zoster 05/19/2019  . Myocardial infarction (HCC)    approx 1994  . Neuropathy   . Other chest pain   . Recurrent falls 06/25/2019  . 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 Severe aortic stenosis   . Sleep apnea   . 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    Past Surgical History:  Procedure Laterality Date  . CARDIAC CATHETERIZATION    . CARDIAC SURGERY    . CATARACT EXTRACTION, BILATERAL    . CORONARY ARTERY BYPASS GRAFT    . CORONARY STENT INTERVENTION N/A 10/07/2019   Procedure: CORONARY STENT INTERVENTION;  Surgeon: Tonny Bollman, MD;  Location: [facility] INVASIVE CV LAB;  Service: Cardiovascular;  Laterality: N/A;  . EYE SURGERY    . HEMORROIDECTOMY    . RIGHT/LEFT HEART CATH AND CORONARY/GRAFT ANGIOGRAPHY N/A 10/07/2019   Procedure: RIGHT/LEFT HEART CATH AND CORONARY/GRAFT ANGIOGRAPHY;  Surgeon: Tonny Bollman, MD;  Location: [facility] INVASIVE CV LAB;  Service: Cardiovascular;  Laterality: N/A;  . TEE WITHOUT CARDIOVERSION N/A 10/27/2019   Procedure: TRANSESOPHAGEAL ECHOCARDIOGRAM (TEE);  Surgeon: Kathleene Hazel,  MD;  Location: [facility] INVASIVE CV LAB;  Service: Open Heart Surgery;  Laterality: N/A;  . TRANSCATHETER AORTIC VALVE REPLACEMENT, TRANSFEMORAL N/A 10/27/2019   Procedure: TRANSCATHETER AORTIC VALVE REPLACEMENT, TRANSFEMORAL;  Surgeon: Kathleene Hazel, MD;  Location: [facility] INVASIVE CV LAB;  Service: Open Heart Surgery;  Laterality: N/A;    Current Medications: Outpatient Medications Prior to Visit  Medication Sig Dispense Refill  . acetaminophen (TYLENOL) 650 MG CR tablet Take 650 mg by mouth every 8 (eight) hours as needed for pain.     Marland Kitchen amLODipine (NORVASC) 10 MG tablet Take 1 tablet (10 mg total) by mouth daily. 90 tablet 1  . aspirin EC 81 MG tablet Take 81 mg by mouth daily.    . clopidogrel (PLAVIX) 75 MG tablet Take 1 tablet (75 mg total) by mouth daily with breakfast. 30 tablet 5  . enalapril (VASOTEC) 20 MG tablet Take 1 tablet (20 mg total) by mouth daily. 90 tablet 1  . furosemide (LASIX) 40 MG tablet Take 1 tablet (40 mg total) by  mouth daily. 90 tablet 1  . gabapentin (NEURONTIN) 300 MG capsule Take 300 mg by mouth 2 (two) times daily.     . nitroGLYCERIN (NITROSTAT) 0.4 MG SL tablet Place 1 tablet (0.4 mg total) under the tongue every 5 (five) minutes as needed for chest pain. 25 tablet 2  . pantoprazole (PROTONIX) 40 MG tablet Take 1 tablet (40 mg total) by mouth daily. 30 tablet 2  . potassium chloride (KLOR-CON) 10 MEQ tablet Take 10 mEq by mouth daily.    . rosuvastatin (CRESTOR) 20 MG tablet Take 1 tablet (20 mg total) by mouth at bedtime. 90 tablet 1  . sertraline (ZOLOFT) 50 MG tablet Take 1 tablet (50 mg total) by mouth in the morning. 90 tablet 1  . trolamine salicylate (BLUE-EMU HEMP) 10 % cream Apply 1 application topically daily as needed for muscle pain (Hands).     . vitamin B-12 (CYANOCOBALAMIN) 500 MCG tablet Take 500 mcg by mouth daily.     No facility-administered medications prior to visit.     Allergies:   Poison oak extract   Social History    Socioeconomic History  . Marital status: Widowed    Spouse name: Not on file  . Number of children: 2  . Years of education: Not on file  . Highest education level: Not on file  Occupational History  . Occupation: retired  Tobacco Use  . Smoking status: Former Smoker    Types: Cigarettes, Cigars    Quit date: 05/02/1962    Years since quitting: 57.5  . Smokeless tobacco: Never Used  Vaping Use  . Vaping Use: Never used  Substance and Sexual Activity  . Alcohol use: No  . Drug use: No  . Sexual activity: Not Currently  Other Topics Concern  . Not on file  Social History Narrative  . Not on file   Social Determinants of Health   Financial Resource Strain:   . Difficulty of Paying Living Expenses:   Food Insecurity: No Food Insecurity  . Worried About Programme researcher, broadcasting/film/video in the Last Year: Never true  . Ran Out of Food in the Last Year: Never true  Transportation Needs: No Transportation Needs  . Lack of Transportation (Medical): No  . Lack of Transportation (Non-Medical): No  Physical Activity:   . Days of Exercise per Week:   . Minutes of Exercise per Session:   Stress:   . Feeling of Stress :   Social Connections:   . Frequency of Communication with Friends and Family:   . Frequency of Social Gatherings with Friends and Family:   . Attends Religious Services:   . Active Member of Clubs or Organizations:   . Attends Banker Meetings:   Marland Kitchen Marital Status:      Family History:  The patient's family history includes Arthritis in his brother and father; Cancer in his brother and mother; Diabetes in his mother; Hypertension in his father; Kidney disease in his mother; Migraines in his father; Sleep disorder in his father and mother; Stroke in his mother.     ROS:   Please see the history of present illness.    ROS All other systems reviewed and are negative.   PHYSICAL EXAM:   VS:  BP 122/68   Pulse 67   Ht 5\' 6"  (1.676 m)   Wt 164 lb (74.4 kg)    SpO2 98%   BMI 26.47 kg/m    GEN: Well nourished, well developed, in no acute  distress HEENT: normal Neck: no JVD or masses Cardiac: RRR; soft flow murmurs @ RUSB. No rubs, or gallops,no edema  Respiratory:  clear to auscultation bilaterally, normal work of breathing GI: soft, nontender, nondistended, + BS MS: no deformity or atrophy Skin: warm and dry, no rash.  Groin sites clear without hematoma or ecchymosis  Neuro:  Alert and Oriented x 3, Strength and sensation are intact Psych: euthymic mood, full affect   Wt Readings from Last 3 Encounters:  11/05/19 164 lb (74.4 kg)  10/28/19 176 lb 2.4 oz (79.9 kg)  10/23/19 168 lb 6 oz (76.4 kg)      Studies/Labs Reviewed:   EKG:  EKG is ordered today.  The ekg ordered today demonstrates Sinus with 1st deg AV bock and RBBB. HR 67  Recent Labs: 10/23/2019: ALT 16; B Natriuretic Peptide 108.8 10/28/2019: BUN 7; Creatinine, Ser 0.74; Hemoglobin 10.8; Magnesium 1.7; Platelets 77; Potassium 3.6; Sodium 137   Lipid Panel    Component Value Date/Time   CHOL 127 09/24/2019 0850   TRIG 140 09/24/2019 0850   HDL 35 (L) 09/24/2019 0850   CHOLHDL 3.6 09/24/2019 0850   LDLCALC 67 09/24/2019 0850    Additional studies/ records that were reviewed today include:   TAVR OPERATIVE NOTE   Date of Procedure:                10/27/2019  Preoperative Diagnosis:      Severe Aortic Stenosis   Postoperative Diagnosis:    Same   Procedure:        Transcatheter Aortic Valve Replacement - Percutaneous Right Transfemoral Approach             Edwards Sapien 3 Ultra THV (size 26 mm, model # 9750TFX, serial # [id number])              Co-Surgeons:                        Salvatore Decent. Cornelius Moras, MD and Verne Carrow, MD  Anesthesiologist:                  Kipp Brood, MD  Echocardiographer:              Thurmon Fair, MD  Pre-operative Echo Findings: ? Severe aortic stenosis ? Normal left ventricular systolic function  Post-operative  Echo Findings: ? No paravalvular leak ? Normal left ventricular systolic function  _________________  Echo 10/28/19 IMPRESSIONS  1. Left ventricular ejection fraction, by estimation, is 65 to 70%. The  left ventricle has normal function. The left ventricle has no regional  wall motion abnormalities. There is mild concentric left ventricular  hypertrophy. Left ventricular diastolic  parameters are consistent with Grade II diastolic dysfunction  (pseudonormalization). Elevated left atrial pressure.  2. Right ventricular systolic function is normal. The right ventricular  size is normal. There is normal pulmonary artery systolic pressure. The  estimated right ventricular systolic pressure is 22.5 mmHg.  3. Left atrial size was moderately dilated.  4. The mitral valve is normal in structure. Trivial mitral valve  regurgitation.  5. The aortic valve has been repaired/replaced. There is a 26 mm Edwards  Sapien prosthetic (TAVR) valve present in the aortic position. Procedure  Date: 10/27/2019. Aortic valve mean gradient measures 9.0 mmHg. Aortic  valve Vmax measures 2.04 m/s.  6. The inferior vena cava is normal in size with greater than 50%  respiratory variability, suggesting right atrial pressure of 3 mmHg.  ASSESSMENT & PLAN:   Severe AS s/p TAVR: doing great with improvement in sx since TAVR. Groin sites healing well. ECG with no HAVB. SBE prophylaxis discussed; I have RX'd amoxicillin. Continue on aspirin and plavix. I will see him back for 1 month follow up and echo on 9/2  HTN: BP well controlled. No changes made.   CAD s/p CABG: s/p PCI for severe mid LAD stenosis just beyond the diagonal vein graft insertion site. Continue antiplatelets and statin.   Conduction dz: ECG shows stable 1st deg AV block and RBBB. No high risk sx. Wearing Zio patch.     Medication Adjustments/Labs and Tests Ordered: Current medicines are reviewed at length with the patient today.  Concerns  regarding medicines are outlined above.  Medication changes, Labs and Tests ordered today are listed in the Patient Instructions below. Patient Instructions  Medication Instructions:  Your provider discussed the importance of taking an antibiotic prior to all dental visits to prevent damage to the heart valves from infection. You were given a prescription for AMOXIL 2,000 mg to take one hour prior to any dental appointment.  *If you need a refill on your cardiac medications before your next appointment, please call your pharmacy*  Follow-Up: Please keep your follow-up appointments as scheduled!    Signed, Cline Crock, PA-C  11/05/2019 1:34 PM    [facility] [address] [place], [place], [place]  [address] Phone: [phone]; Fax: [phone]

## 2019-11-05 NOTE — Patient Instructions (Signed)
Medication Instructions:  Your provider discussed the importance of taking an antibiotic prior to all dental visits to prevent damage to the heart valves from infection. You were given a prescription for AMOXIL 2,000 mg to take one hour prior to any dental appointment.  *If you need a refill on your cardiac medications before your next appointment, please call your pharmacy*  Follow-Up: Please keep your follow-up appointments as scheduled! 

## 2019-11-06 ENCOUNTER — Telehealth: Payer: Self-pay | Admitting: Physician Assistant

## 2019-11-06 NOTE — Telephone Encounter (Signed)
Patient's son calling stating the patient was told he could go back to yard work. He would like to know if their is a weight limit for heavy lifting and if he can ride the lawn mower. He also states [employer] denied a procedure since he was already approved for one, but he is not sure what he was approved for.

## 2019-11-06 NOTE — Telephone Encounter (Signed)
Informed the patient's son that Mr. Jack Hodges may advance activity as tolerated and as no real limitations.  He understands a message will be sent to Billing to clarify if testing was denied.  He was grateful for assistance.

## 2019-11-26 ENCOUNTER — Ambulatory Visit (INDEPENDENT_AMBULATORY_CARE_PROVIDER_SITE_OTHER): Payer: Medicare Other | Admitting: Physician Assistant

## 2019-11-26 ENCOUNTER — Other Ambulatory Visit: Payer: Self-pay

## 2019-11-26 ENCOUNTER — Ambulatory Visit (HOSPITAL_COMMUNITY): Payer: Medicare Other | Attending: Cardiology

## 2019-11-26 ENCOUNTER — Encounter: Payer: Self-pay | Admitting: Physician Assistant

## 2019-11-26 VITALS — BP 144/70 | HR 63 | Ht 66.0 in | Wt 164.0 lb

## 2019-11-26 DIAGNOSIS — Z952 Presence of prosthetic heart valve: Secondary | ICD-10-CM | POA: Insufficient documentation

## 2019-11-26 DIAGNOSIS — I459 Conduction disorder, unspecified: Secondary | ICD-10-CM

## 2019-11-26 DIAGNOSIS — I10 Essential (primary) hypertension: Secondary | ICD-10-CM

## 2019-11-26 DIAGNOSIS — I2510 Atherosclerotic heart disease of native coronary artery without angina pectoris: Secondary | ICD-10-CM | POA: Diagnosis not present

## 2019-11-26 LAB — ECHOCARDIOGRAM COMPLETE
AR max vel: 1.65 cm2
AV Area VTI: 1.62 cm2
AV Area mean vel: 1.63 cm2
AV Mean grad: 7 mmHg
AV Peak grad: 11.7 mmHg
Ao pk vel: 1.71 m/s
Area-P 1/2: 2.86 cm2
Height: 66 in
S' Lateral: 3.1 cm
Weight: 2624 oz

## 2019-11-26 MED ORDER — GABAPENTIN 300 MG PO CAPS: 300.0000 mg | ORAL_CAPSULE | Freq: Two times a day (BID) | ORAL | 0 refills | Status: DC

## 2019-11-26 NOTE — Progress Notes (Signed)
[facility]   MULTIDISCIPLINARY HEART VALVE CLINIC                                       Cardiology Office Note    Date:  11/26/2019   ID:  Jack Hodges, DOB 1930/12/03, MRN [id number]  PCP:  Abigail Miyamoto, MD  Cardiologist:  Dr. Bing Matter / Dr. Cornelius Moras & Dr. Excell Seltzer (TAVR)  CC: 1 month s/p TAVR  History of Present Illness:  Jack Hodges is a 86 y.o. male with a history of CAD s/p CABG in 1996 and recently status post PCI, HTN, PAD with left subclavian stenosis, remote history of stroke, HLD, peripheral neuropathy with bilateral foot drop, mild dementia, RBBB, 1st deg AV block, and severe AS s/p TAVR (10/27/19) who presents to clinic for follow up.   Patient's cardiac history dates back to 1996 when he presented with symptomatic coronary artery disease. He underwent coronary artery bypass grafting x5 and did very well. He has developed aortic stenosis that gradually progressed in severity on follow-up imaging. In March 2021 he was hospitalized with atypical chest pain that was felt likely related to shingles. He recently underwent follow-up echocardiogram that revealed significant progression and severity of aortic stenosis with preserved left ventricular systolic function. He was seen in follow-up by Dr. Bing Matter and referred to the multidisciplinary heart valve clinic where he was evaluated by Dr. Clifton James. He subsequently underwent diagnostic cardiac catheterization on October 07, 2019. He was noted to have severe native coronary artery disease with chronic total occlusion of the left anterior descending coronary artery, left circumflex coronary artery, and the right coronary arteries. He had continued patency of the greater saphenous vein grafts to the diagonal branch which filled the left anterior descending coronary artery, the obtuse marginal branch, and sequential saphenous vein grafts to both the posterior descending and the posterior lateral branches. 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 There was significant high-grade stenosis in the left anterior descending coronary artery just beyond the diagonal vein graft insertion site. He underwent successful PCI and stenting of the left anterior descending coronary artery at that time. Catheterization also confirmed the presence of aortic stenosis and revealed moderately elevated right-sided pressures.   He was evaluated by the multidisciplinary valve team and underwent successful TAVR with a 26 mm Edwards Sapien 3 THV via the TF approach on 10/27/19. Post operative echo showed EF 65%, normally functioning TAVR with a mean gradient of 9 mmHg and no PVL. He was continued on aspirin and plavix. Given underlying conduction dz, he was discharged with a Zio patch. This has not been read.   Today he presents to clinic for follow up. No CP or SOB. No LE edema, orthopnea or PND. No dizziness or syncope. No blood in stool or urine. No palpitations. Mostly limited by balance issues and neuropathy. Would like a refill on gabapentin.    Past Medical History:  Diagnosis Date  . Arthritis   . Bilateral foot-drop 01/18/2017  . Cellulitis of left hand 07/07/2019  . Coronary artery disease involving native coronary artery of native heart without angina pectoris 11/01/2015   Overview:  Bypass surgery in 1996 Overview:  Overview:  Bypass surgery in 1996  Last Assessment & Plan:  Continue his current regimen.  Follow up as noted.  . CVA (cerebral vascular accident) (HCC) 04/03/2016   Last Assessment & Plan:  The patient underwent extensive CVA workup.  MRI is negative.  He does have aortic stenosis by echocardiogram.  Upon review of Care everywhere he is followed by Dr. Bing Matter, cardiologist for this.  Carotid ultrasound negative for hemodynamically significant stenosis.  Lipid panel within normal limits.  He has not had any further episodes of dizziness or nausea vomiting.   . Dementia (HCC)   . Depression   .  Dizziness 04/19/2016  . DNR (do not resuscitate) 04/03/2016   Overview:  I had an extensive discussion with the patient on 04/03/2016 regarding his end of life wishes.  He and his daughter agree that do not resuscitate is in keeping with his beliefs.  . Dyslipidemia 11/01/2015   Last Assessment & Plan:  Formatting of this note might be different from the original. Cholesterol 136   Triglycerides  83  HDL  50.1  LDL Calculated  69  VLDL Cholesterol Cal  17   Continue statin.  Marland Kitchen Dyspnea    with activity  . Essential hypertension 11/01/2015   Last Assessment & Plan:  Resume home medications.  Follow-up as noted above.  Marland Kitchen GERD (gastroesophageal reflux disease)   . Herpes zoster 05/19/2019  . Myocardial infarction (HCC)    approx 1994  . Neuropathy   . Other chest pain   . Recurrent falls 06/25/2019  . 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 Severe aortic stenosis   . Sleep apnea   . 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    Past Surgical History:  Procedure Laterality Date  . CARDIAC CATHETERIZATION    . CARDIAC SURGERY    . CATARACT EXTRACTION, BILATERAL    . CORONARY ARTERY BYPASS GRAFT    . CORONARY STENT INTERVENTION N/A 10/07/2019   Procedure: CORONARY STENT INTERVENTION;  Surgeon: Tonny Bollman, MD;  [facility] INVASIVE CV LAB;  Service: Cardiovascular;  Laterality: N/A;  . EYE SURGERY    . HEMORROIDECTOMY    . RIGHT/LEFT HEART CATH AND CORONARY/GRAFT ANGIOGRAPHY N/A 10/07/2019   Procedure: RIGHT/LEFT HEART CATH AND CORONARY/GRAFT ANGIOGRAPHY;  Surgeon: Tonny Bollman, MD;  Location: [facility] INVASIVE CV LAB;  Service: Cardiovascular;  Laterality: N/A;  . TEE WITHOUT CARDIOVERSION N/A 10/27/2019   Procedure: TRANSESOPHAGEAL  ECHOCARDIOGRAM (TEE);  Surgeon: Kathleene Hazel, MD;  Location: [facility] INVASIVE CV LAB;  Service: Open Heart Surgery;  Laterality: N/A;  . TRANSCATHETER AORTIC VALVE REPLACEMENT, TRANSFEMORAL N/A 10/27/2019   Procedure: TRANSCATHETER AORTIC VALVE REPLACEMENT, TRANSFEMORAL;  Surgeon: Kathleene Hazel, MD;  Location: [facility] INVASIVE CV LAB;  Service: Open Heart Surgery;  Laterality: N/A;    Current Medications: Outpatient Medications Prior to Visit  Medication Sig Dispense Refill  . acetaminophen (TYLENOL) 650 MG CR tablet Take 650 mg by mouth every 8 (eight) hours as needed for pain.     Marland Kitchen amLODipine (NORVASC) 10 MG tablet Take 1 tablet (10 mg total) by mouth daily. 90 tablet 1  . amoxicillin (AMOXIL) 500 MG tablet Take 4 capsules (2,000 mg) one hour prior to all dental visits. 8 tablet 11  . aspirin EC 81 MG tablet Take 81 mg by mouth daily.    . clopidogrel (PLAVIX) 75 MG tablet Take 1 tablet (75 mg total) by mouth daily with breakfast. 30 tablet 5  . enalapril (  VASOTEC) 20 MG tablet Take 1 tablet (20 mg total) by mouth daily. 90 tablet 1  . furosemide (LASIX) 40 MG tablet Take 1 tablet (40 mg total) by mouth daily. 90 tablet 1  . nitroGLYCERIN (NITROSTAT) 0.4 MG SL tablet Place 1 tablet (0.4 mg total) under the tongue every 5 (five) minutes as needed for chest pain. 25 tablet 2  . pantoprazole (PROTONIX) 40 MG tablet Take 1 tablet (40 mg total) by mouth daily. 30 tablet 2  . potassium chloride (KLOR-CON) 10 MEQ tablet Take 10 mEq by mouth daily.    . rosuvastatin (CRESTOR) 20 MG tablet Take 1 tablet (20 mg total) by mouth at bedtime. 90 tablet 1  . sertraline (ZOLOFT) 50 MG tablet Take 1 tablet (50 mg total) by mouth in the morning. 90 tablet 1  . trolamine salicylate (BLUE-EMU HEMP) 10 % cream Apply 1 application topically daily as needed for muscle pain (Hands).     . vitamin B-12 (CYANOCOBALAMIN) 500 MCG tablet Take 500 mcg by mouth daily.    Marland Kitchen gabapentin (NEURONTIN) 300 MG capsule  Take 300 mg by mouth 2 (two) times daily.  (Patient not taking: Reported on 11/26/2019)     No facility-administered medications prior to visit.     Allergies:   Poison oak extract   Social History   Socioeconomic History  . Marital status: Widowed    Spouse name: Not on file  . Number of children: 2  . Years of education: Not on file  . Highest education level: Not on file  Occupational History  . Occupation: retired  Tobacco Use  . Smoking status: Former Smoker    Types: Cigarettes, Cigars    Quit date: 05/02/1962    Years since quitting: 57.6  . Smokeless tobacco: Never Used  Vaping Use  . Vaping Use: Never used  Substance and Sexual Activity  . Alcohol use: No  . Drug use: No  . Sexual activity: Not Currently  Other Topics Concern  . Not on file  Social History Narrative  . Not on file   Social Determinants of Health   Financial Resource Strain:   . Difficulty of Paying Living Expenses: Not on file  Food Insecurity: No Food Insecurity  . Worried About Programme researcher, broadcasting/film/video in the Last Year: Never true  . Ran Out of Food in the Last Year: Never true  Transportation Needs: No Transportation Needs  . Lack of Transportation (Medical): No  . Lack of Transportation (Non-Medical): No  Physical Activity:   . Days of Exercise per Week: Not on file  . Minutes of Exercise per Session: Not on file  Stress:   . Feeling of Stress : Not on file  Social Connections:   . Frequency of Communication with Friends and Family: Not on file  . Frequency of Social Gatherings with Friends and Family: Not on file  . Attends Religious Services: Not on file  . Active Member of Clubs or Organizations: Not on file  . Attends Banker Meetings: Not on file  . Marital Status: Not on file     Family History:  The patient's family history includes Arthritis in his brother and father; Cancer in his brother and mother; Diabetes in his mother; Hypertension in his father; Kidney disease  in his mother; Migraines in his father; Sleep disorder in his father and mother; Stroke in his mother.     ROS:   Please see the history of present illness.  ROS All other systems reviewed and are negative.   PHYSICAL EXAM:   VS:  BP (!) 144/70   Pulse 63   Ht 5\' 6"  (1.676 m)   Wt 164 lb (74.4 kg)   SpO2 97%   BMI 26.47 kg/m    GEN: Well nourished, well developed, in no acute distress HEENT: normal Neck: no JVD or masses Cardiac: RRR; soft flow murmurs @ RUSB. No rubs, or gallops,no edema  Respiratory:  clear to auscultation bilaterally, normal work of breathing GI: soft, nontender, nondistended, + BS MS: no deformity or atrophy Skin: warm and dry, no rash.  Neuro:  Alert and Oriented x 3, Strength and sensation are intact Psych: euthymic mood, full affect   Wt Readings from Last 3 Encounters:  11/26/19 164 lb (74.4 kg)  11/05/19 164 lb (74.4 kg)  10/28/19 176 lb 2.4 oz (79.9 kg)      Studies/Labs Reviewed:   EKG:  EKG is NOT ordered today.    Recent Labs: 10/23/2019: ALT 16; B Natriuretic Peptide 108.8 10/28/2019: BUN 7; Creatinine, Ser 0.74; Hemoglobin 10.8; Magnesium 1.7; Platelets 77; Potassium 3.6; Sodium 137   Lipid Panel    Component Value Date/Time   CHOL 127 09/24/2019 0850   TRIG 140 09/24/2019 0850   HDL 35 (L) 09/24/2019 0850   CHOLHDL 3.6 09/24/2019 0850   LDLCALC 67 09/24/2019 0850    Additional studies/ records that were reviewed today include:   TAVR OPERATIVE NOTE   Date of Procedure:                10/27/2019  Preoperative Diagnosis:      Severe Aortic Stenosis   Postoperative Diagnosis:    Same   Procedure:        Transcatheter Aortic Valve Replacement - Percutaneous Right Transfemoral Approach             Edwards Sapien 3 Ultra THV (size 26 mm, model # 9750TFX, serial # 12/27/2019)              Co-Surgeons:                        [id number]. Salvatore Decent, MD and Cornelius Moras, MD  Anesthesiologist:                  Verne Carrow, MD  Echocardiographer:              Kipp Brood, MD  Pre-operative Echo Findings: ? Severe aortic stenosis ? Normal left ventricular systolic function  Post-operative Echo Findings: ? No paravalvular leak ? Normal left ventricular systolic function  _________________  Echo 10/28/19 IMPRESSIONS  1. Left ventricular ejection fraction, by estimation, is 65 to 70%. The  left ventricle has normal function. The left ventricle has no regional  wall motion abnormalities. There is mild concentric left ventricular  hypertrophy. Left ventricular diastolic  parameters are consistent with Grade II diastolic dysfunction  (pseudonormalization). Elevated left atrial pressure.  2. Right ventricular systolic function is normal. The right ventricular  size is normal. There is normal pulmonary artery systolic pressure. The  estimated right ventricular systolic pressure is 22.5 mmHg.  3. Left atrial size was moderately dilated.  4. The mitral valve is normal in structure. Trivial mitral valve  regurgitation.  5. The aortic valve has been repaired/replaced. There is a 26 mm Edwards  Sapien prosthetic (TAVR) valve present in the aortic position. Procedure  Date: 10/27/2019. Aortic valve mean gradient measures 9.0  mmHg. Aortic  valve Vmax measures 2.04 m/s.  6. The inferior vena cava is normal in size with greater than 50%  respiratory variability, suggesting right atrial pressure of 3 mmHg.    _________________  Echo 11/26/19 IMPRESSIONS 1. 1 month post TAVR, normal transaortic gradients, no paravalvular leak.  2. Left ventricular ejection fraction, by estimation, is 65 to 70%. The  left ventricle has normal function. The left ventricle has no regional  wall motion abnormalities. There is moderate concentric left ventricular  hypertrophy. Left ventricular  diastolic parameters are consistent with Grade I diastolic dysfunction  (impaired relaxation).  3. Right ventricular  systolic function is normal. The right ventricular  size is mildly enlarged. There is normal pulmonary artery systolic  pressure.  4. Left atrial size was moderately dilated.  5. The mitral valve is normal in structure. Mild mitral valve  regurgitation. No evidence of mitral stenosis.  6. The aortic valve is normal in structure. Aortic valve regurgitation is  not visualized. No aortic stenosis is present. There is a 26 mm Edwards  Sapien prosthetic (TAVR) valve present in the aortic position. Procedure  Date: 10/27/19. Aortic valve mean  gradient measures 7.0 mmHg.  7. The inferior vena cava is normal in size with greater than 50%  respiratory variability, suggesting right atrial pressure of 3 mmHg.   ASSESSMENT & PLAN:   Severe AS s/p TAVR:  echo today shows EF 65%, normally functioning TAVR with a mean gradient of 7 mm hg and no PVL. Mild MR. He has NYHA class I symptoms. SBE prophylaxis discussed; he has amoxicillin. Continue on aspirin and plavix. Can stop plavix 6 months out from TAVR and PCI (04/2020). I will see him back in 1 year with follow up and echo.   HTN: Bp mildly elevated today. No changes made .  CAD s/p CABG: s/p PCI for severe mid LAD stenosis just beyond the diagonal vein graft insertion site. Continue antiplatelets and statin.   Conduction dz: Zio patch still pending but no high risk symptoms or alerts.  Medication Adjustments/Labs and Tests Ordered: Current medicines are reviewed at length with the patient today.  Concerns regarding medicines are outlined above.  Medication changes, Labs and Tests ordered today are listed in the Patient Instructions below. Patient Instructions  Medication Instructions:  Your physician recommends that you continue on your current medications as directed. Please refer to the Current Medication list given to you today.  *If you need a refill on your cardiac medications before your next appointment, please call your  pharmacy*   Lab Work: None If you have labs (blood work) drawn today and your tests are completely normal, you will receive your results only by: Marland Kitchen MyChart Message (if you have MyChart) OR . A paper copy in the mail If you have any lab test that is abnormal or we need to change your treatment, we will call you to review the results.   Testing/Procedures: None   Follow-Up: At [facility], you and your health needs are our priority.  As part of our continuing mission to provide you with exceptional heart care, we have created designated Provider Care Teams.  These Care Teams include your primary Cardiologist (physician) and Advanced Practice Providers (APPs -  Physician Assistants and Nurse Practitioners) who all work together to provide you with the care you need, when you need it.  We recommend signing up for the patient portal called "MyChart".  Sign up information is provided on this After Visit Summary.  MyChart is used to connect with patients for Virtual Visits (Telemedicine).  Patients are able to view lab/test results, encounter notes, upcoming appointments, etc.  Non-urgent messages can be sent to your provider as well.   To learn more about what you can do with MyChart, go to [email or web address].    Your next appointment:   3 month(s)  The format for your next appointment:   In Person  Provider:   You will see Gypsy Balsam, MD.  Or, you can be scheduled with the following Advanced Practice Provider on your designated Care Team (at our [facility]):    Other Instructions You may stop taking Plavix and Pantoprazole in February. You may start taking Nexium in February.     Signed, Cline Crock, PA-C  11/26/2019 7:09 PM    [facility] [address] [place], [place], [place]  [address] Phone: [phone]; Fax: [phone]

## 2019-11-26 NOTE — Patient Instructions (Addendum)
Medication Instructions:  Your physician recommends that you continue on your current medications as directed. Please refer to the Current Medication list given to you today.  *If you need a refill on your cardiac medications before your next appointment, please call your pharmacy*   Lab Work: None If you have labs (blood work) drawn today and your tests are completely normal, you will receive your results only by:  MyChart Message (if you have MyChart) OR  A paper copy in the mail If you have any lab test that is abnormal or we need to change your treatment, we will call you to review the results.   Testing/Procedures: None   Follow-Up: At [facility], you and your health needs are our priority.  As part of our continuing mission to provide you with exceptional heart care, we have created designated Provider Care Teams.  These Care Teams include your primary Cardiologist (physician) and Advanced Practice Providers (APPs -  Physician Assistants and Nurse Practitioners) who all work together to provide you with the care you need, when you need it.  We recommend signing up for the patient portal called "MyChart".  Sign up information is provided on this After Visit Summary.  MyChart is used to connect with patients for Virtual Visits (Telemedicine).  Patients are able to view lab/test results, encounter notes, upcoming appointments, etc.  Non-urgent messages can be sent to your provider as well.   To learn more about what you can do with MyChart, go to [email or web address].    Your next appointment:   3 month(s)  The format for your next appointment:   In Person  Provider:   You will see Gypsy Balsam, MD.  Or, you can be scheduled with the following Advanced Practice Provider on your designated Care Team (at our [facility]):    Other Instructions You may stop taking Plavix and Pantoprazole in February. You may start taking Nexium in February.

## 2019-12-09 ENCOUNTER — Other Ambulatory Visit: Payer: Self-pay | Admitting: Legal Medicine

## 2019-12-09 DIAGNOSIS — K2100 Gastro-esophageal reflux disease with esophagitis, without bleeding: Secondary | ICD-10-CM

## 2019-12-09 DIAGNOSIS — I63139 Cerebral infarction due to embolism of unspecified carotid artery: Secondary | ICD-10-CM

## 2019-12-15 ENCOUNTER — Other Ambulatory Visit: Payer: Self-pay | Admitting: Legal Medicine

## 2019-12-23 ENCOUNTER — Ambulatory Visit: Payer: Medicare Other | Admitting: Cardiology

## 2019-12-29 ENCOUNTER — Other Ambulatory Visit: Payer: Self-pay | Admitting: Legal Medicine

## 2019-12-29 DIAGNOSIS — B0229 Other postherpetic nervous system involvement: Secondary | ICD-10-CM

## 2020-01-04 ENCOUNTER — Other Ambulatory Visit: Payer: Self-pay

## 2020-01-04 ENCOUNTER — Encounter: Payer: Self-pay | Admitting: Legal Medicine

## 2020-01-04 ENCOUNTER — Ambulatory Visit (INDEPENDENT_AMBULATORY_CARE_PROVIDER_SITE_OTHER): Payer: Medicare Other | Admitting: Legal Medicine

## 2020-01-04 VITALS — BP 150/50 | HR 50 | Temp 97.8°F | Ht 66.0 in | Wt 173.8 lb

## 2020-01-04 DIAGNOSIS — G5602 Carpal tunnel syndrome, left upper limb: Secondary | ICD-10-CM

## 2020-01-04 DIAGNOSIS — M7552 Bursitis of left shoulder: Secondary | ICD-10-CM

## 2020-01-04 DIAGNOSIS — R42 Dizziness and giddiness: Secondary | ICD-10-CM

## 2020-01-04 HISTORY — DX: Bursitis of left shoulder: M75.52

## 2020-01-04 MED ORDER — MECLIZINE HCL 25 MG PO TABS: 25.0000 mg | ORAL_TABLET | Freq: Three times a day (TID) | ORAL | 0 refills | Status: DC | PRN

## 2020-01-04 NOTE — Progress Notes (Addendum)
Subjective:  Patient ID: Jack Hodges, male    DOB: April 07, 1930  Age: 86 y.o. MRN: [id number]  Chief Complaint  Patient presents with  . Dizziness    Left shoulder pain    HPI: patient is having left shoulder pain. Limited abduction and pain on movement.  Unable to lift left am above shoulder, he has poor grip but no neck pain.  Positive Tinel right wrist.    Current Outpatient Medications on File Prior to Visit  Medication Sig Dispense Refill  . acetaminophen (TYLENOL) 650 MG CR tablet Take 650 mg by mouth every 8 (eight) hours as needed for pain.     Marland Kitchen aspirin EC 81 MG tablet Take 81 mg by mouth daily.    . enalapril (VASOTEC) 20 MG tablet Take 1 tablet (20 mg total) by mouth daily. 90 tablet 1  . furosemide (LASIX) 40 MG tablet Take 1 tablet (40 mg total) by mouth daily. 90 tablet 1  . nitroGLYCERIN (NITROSTAT) 0.4 MG SL tablet Place 1 tablet (0.4 mg total) under the tongue every 5 (five) minutes as needed for chest pain. 25 tablet 2  . rosuvastatin (CRESTOR) 20 MG tablet TAKE 1 TABLET BY MOUTH AT  BEDTIME 90 tablet 2  . trolamine salicylate (BLUE-EMU HEMP) 10 % cream Apply 1 application topically daily as needed for muscle pain (Hands).     . vitamin B-12 (CYANOCOBALAMIN) 500 MCG tablet Take 500 mcg by mouth daily.     No current facility-administered medications on file prior to visit.   Past Medical History:  Diagnosis Date  . Arthritis   . Bilateral foot-drop 01/18/2017  . Cellulitis of left hand 07/07/2019  . Coronary artery disease involving native coronary artery of native heart without angina pectoris 11/01/2015   Overview:  Bypass surgery in 1996 Overview:  Overview:  Bypass surgery in 1996  Last Assessment & Plan:  Continue his current regimen.  Follow up as noted.  . CVA (cerebral vascular accident) (HCC) 04/03/2016   Last Assessment & Plan:  The patient underwent extensive CVA workup.  MRI is negative.  He does have aortic stenosis by echocardiogram.  Upon review of Care  everywhere he is followed by Dr. Bing Matter, cardiologist for this.  Carotid ultrasound negative for hemodynamically significant stenosis.  Lipid panel within normal limits.  He has not had any further episodes of dizziness or nausea vomiting.   . Dementia (HCC)   . Depression   . Dizziness 04/19/2016  . DNR (do not resuscitate) 04/03/2016   Overview:  I had an extensive discussion with the patient on 04/03/2016 regarding his end of life wishes.  He and his daughter agree that do not resuscitate is in keeping with his beliefs.  . Dyslipidemia 11/01/2015   Last Assessment & Plan:  Formatting of this note might be different from the original. Cholesterol 136   Triglycerides  83  HDL  50.1  LDL Calculated  69  VLDL Cholesterol Cal  17   Continue statin.  Marland Kitchen Dyspnea    with activity  . Essential hypertension 11/01/2015   Last Assessment & Plan:  Resume home medications.  Follow-up as noted above.  Marland Kitchen GERD (gastroesophageal reflux disease)   . Herpes zoster 05/19/2019  . Myocardial infarction (HCC)    approx 1994  . Neuropathy   . Other chest pain   . Recurrent falls 06/25/2019  . 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 Severe aortic stenosis   . Sleep apnea   . 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   Past Surgical History:  Procedure Laterality Date  . CARDIAC CATHETERIZATION    . CARDIAC SURGERY    . CATARACT EXTRACTION, BILATERAL    . CORONARY ARTERY BYPASS GRAFT    . CORONARY STENT INTERVENTION N/A 10/07/2019   Procedure: CORONARY STENT INTERVENTION;  Surgeon: Tonny Bollman, MD;  Location: [facility] INVASIVE CV LAB;  Service: Cardiovascular;  Laterality: N/A;  . EYE SURGERY    . HEMORROIDECTOMY    . RIGHT/LEFT HEART CATH AND CORONARY/GRAFT  ANGIOGRAPHY N/A 10/07/2019   Procedure: RIGHT/LEFT HEART CATH AND CORONARY/GRAFT ANGIOGRAPHY;  Surgeon: Tonny Bollman, MD;  Location: [facility] INVASIVE CV LAB;  Service: Cardiovascular;  Laterality: N/A;  . TEE WITHOUT CARDIOVERSION N/A 10/27/2019   Procedure: TRANSESOPHAGEAL ECHOCARDIOGRAM (TEE);  Surgeon: Kathleene Hazel, MD;  Location: [facility] INVASIVE CV LAB;  Service: Open Heart Surgery;  Laterality: N/A;  . TRANSCATHETER AORTIC VALVE REPLACEMENT, TRANSFEMORAL N/A 10/27/2019   Procedure: TRANSCATHETER AORTIC VALVE REPLACEMENT, TRANSFEMORAL;  Surgeon: Kathleene Hazel, MD;  Location: [facility] INVASIVE CV LAB;  Service: Open Heart Surgery;  Laterality: N/A;    Family History  Problem Relation Age of Onset  . Cancer Mother   . Diabetes Mother   . Kidney disease Mother   . Stroke Mother   . Sleep disorder Mother   . Arthritis Father   . Migraines Father   . Hypertension Father   . Sleep disorder Father   . Arthritis Brother   . Cancer Brother    Social History   Socioeconomic History  . Marital status: Widowed    Spouse name: Not on file  . Number of children: 2  . Years of education: Not on file  . Highest education level: Not on file  Occupational History  . Occupation: retired  Tobacco Use  . Smoking status: Former Smoker    Types: Cigarettes, Cigars    Quit date: 05/02/1962    Years since quitting: 57.7  . Smokeless tobacco: Never Used  Vaping Use  . Vaping Use: Never used  Substance and Sexual Activity  . Alcohol use: No  . Drug use: No  . Sexual activity: Not Currently  Other Topics Concern  . Not on file  Social History Narrative  . Not on file   Social Determinants of Health   Financial Resource Strain:   . Difficulty of Paying Living Expenses: Not on file  Food Insecurity: No Food Insecurity  . Worried About Programme researcher, broadcasting/film/video in the Last Year: Never true  . Ran Out of Food in the Last Year: Never true  Transportation Needs: No Transportation Needs  . Lack  of Transportation (Medical): No  . Lack of Transportation (Non-Medical): No  Physical Activity:   . Days of Exercise per Week: Not on file  . Minutes of Exercise per Session: Not on file  Stress:   . Feeling of Stress : Not on file  Social Connections:   . Frequency of Communication with Friends and Family: Not on file  . Frequency of Social Gatherings with Friends and Family: Not on file  . Attends Religious Services: Not on file  . Active Member of Clubs or Organizations: Not on file  . Attends Banker Meetings: Not on file  .  Marital Status: Not on file    Review of Systems  HENT: Negative.   Eyes: Negative.   Respiratory: Positive for shortness of breath.   Cardiovascular: Negative for chest pain and leg swelling.  Gastrointestinal: Negative.   Genitourinary: Negative.   Musculoskeletal: Positive for arthralgias (left shoulder).  Neurological: Positive for numbness (left hand).  Psychiatric/Behavioral: Negative.      Objective:  BP (!) 150/50   Pulse (!) 50   Temp 97.8 F (36.6 C)   Ht 5\' 6"  (1.676 m)   Wt 173 lb 12.8 oz (78.8 kg)   SpO2 97%   BMI 28.05 kg/m   BP/Weight 01/04/2020 11/26/2019 11/05/2019  Systolic BP 150 144 122  Diastolic BP 50 70 68  Wt. (Lbs) 173.8 164 164  BMI 28.05 26.47 26.47    Physical Exam Vitals reviewed.  Constitutional:      Appearance: Normal appearance.  Eyes:     Extraocular Movements: Extraocular movements intact.     Pupils: Pupils are equal, round, and reactive to light.  Cardiovascular:     Rate and Rhythm: Normal rate and regular rhythm.     Pulses: Normal pulses.     Heart sounds: Normal heart sounds.  Pulmonary:     Effort: Pulmonary effort is normal.     Breath sounds: Normal breath sounds.  Musculoskeletal:     Left shoulder: Tenderness present. Decreased strength.     Comments: Left shoulder decrease ROM, abduction 60 degrees, flexion 60 degrees.  Limited ROM  Neurological:     General: No focal  deficit present.     Mental Status: He is alert.       Lab Results  Component Value Date   WBC 7.9 10/28/2019   HGB 10.8 (L) 10/28/2019   HCT 32.0 (L) 10/28/2019   PLT 77 (L) 10/28/2019   GLUCOSE 97 10/28/2019   CHOL 127 09/24/2019   TRIG 140 09/24/2019   HDL 35 (L) 09/24/2019   LDLCALC 67 09/24/2019   ALT 16 10/23/2019   AST 21 10/23/2019   NA 137 10/28/2019   K 3.6 10/28/2019   CL 107 10/28/2019   CREATININE 0.74 10/28/2019   BUN 7 (L) 10/28/2019   CO2 22 10/28/2019   INR 1.1 10/23/2019   HGBA1C 4.8 10/23/2019      Assessment & Plan:   1. Carpal tunnel syndrome of left wrist - AMB referral to orthopedics AN INDIVIDUAL CARE PLAN for CTS was established and reinforced today.  The patient's status was assessed using clinical findings on exam, labs, and other diagnostic testing. Patient's success at meeting treatment goals based on disease specific evidence-bassed guidelines and found to be in poor control. He has lost 50% grip RECOMMENDATIONS include refer to orthopedics.  2. Acute bursitis of left shoulder Injection into left shoulder with pain relief.  After consent was obtained, using sterile technique the left shoulder was prepped and plain Ethyl Chloride was used as local anesthetic. The joint was entered and   Steroid 80 mg and 3 ml plain Lidocaine was then injected and the needle withdrawn.  The procedure was well tolerated.  The patient is asked to continue to rest the joint for a few more days before resuming regular activities.  It may be more painful for the first 1-2 days.  Watch for fever, or increased swelling or persistent pain in the joint. Call or return to clinic prn if such symptoms occur or there is failure to improve as anticipated.   3. Dizziness -  meclizine (ANTIVERT) 25 MG tablet; Take 1 tablet (25 mg total) by mouth 3 (three) times daily as needed for dizziness.  Dispense: 30 tablet; Refill: 0 AN INDIVIDUAL CARE PLAN for vertigo was established  and reinforced today.  The patient's status was assessed using clinical findings on exam, labs, and other diagnostic testing. Patient's success at meeting treatment goals based on disease specific evidence-bassed guidelines and found to be in fair control. RECOMMENDATIONS include maintaining present medicines and treatment.    Meds ordered this encounter  Medications  . meclizine (ANTIVERT) 25 MG tablet    Sig: Take 1 tablet (25 mg total) by mouth 3 (three) times daily as needed for dizziness.    Dispense:  30 tablet    Refill:  0    Orders Placed This Encounter  Procedures  . AMB referral to orthopedics     Follow-up: Return in about 1 month (around 02/04/2020).  An After Visit Summary was printed and given to the patient.  Brent Bulla [facility] [phone]

## 2020-01-06 ENCOUNTER — Other Ambulatory Visit: Payer: Self-pay | Admitting: Physician Assistant

## 2020-01-06 DIAGNOSIS — I63139 Cerebral infarction due to embolism of unspecified carotid artery: Secondary | ICD-10-CM

## 2020-01-06 MED ORDER — CLOPIDOGREL BISULFATE 75 MG PO TABS: ORAL_TABLET | ORAL | 3 refills | Status: DC

## 2020-01-06 NOTE — Telephone Encounter (Signed)
*  STAT* If patient is at the pharmacy, call can be transferred to refill team.   1. Which medications need to be refilled? (please list name of each medication and dose if known)  clopidogrel (PLAVIX) 75 MG tablet  2. Which pharmacy/location (including street and city if local pharmacy) is medication to be sent to? [facility] - [place], [place] - [address], [address]  3. Do they need a 30 day or 90 day supply? 90 day supply

## 2020-01-06 NOTE — Telephone Encounter (Signed)
Refill sent in per request.  

## 2020-01-07 ENCOUNTER — Telehealth: Payer: Self-pay

## 2020-01-07 NOTE — Chronic Care Management (AMB) (Addendum)
Chronic Care Management Pharmacy Assistant   Name: Jack Hodges  MRN: [id number] DOB: 09-27-1930  Reason for Encounter: Disease State/ Hypertension and Lipids Adherence Call.   Patient Questions:  1.  Have you seen any other providers since your last visit? [facility] 10/27/19-10/28/19,OV- 11/05/19 Dr. Cline Crock (Cardiology), 11/26/19  Dr. Cline Crock (Cardiology), 01/04/20 Dr. Brent Bulla (PCP).  2.  Any changes in your medicines or health? Yes, Amoxicillin 500 Mg 4 Capsules one hour prior to dental visit on 11/05/19. Meclizine 25 Mg added, Amoxicillin 500 Mg and Pregabalin 75 mg discontinued on 01/04/20.   PCP : Abigail Miyamoto, MD  Allergies:   Allergies  Allergen Reactions   Poison Oak Extract Rash    Medications: Outpatient Encounter Medications as of 01/07/2020  Medication Sig   acetaminophen (TYLENOL) 650 MG CR tablet Take 650 mg by mouth every 8 (eight) hours as needed for pain.    amLODipine (NORVASC) 10 MG tablet Take 1 tablet (10 mg total) by mouth daily.   aspirin EC 81 MG tablet Take 81 mg by mouth daily.   clopidogrel (PLAVIX) 75 MG tablet TAKE ONE TABLET BY MOUTH ONCE DAILY WITH BREAKFAST   enalapril (VASOTEC) 20 MG tablet Take 1 tablet (20 mg total) by mouth daily.   furosemide (LASIX) 40 MG tablet Take 1 tablet (40 mg total) by mouth daily.   gabapentin (NEURONTIN) 300 MG capsule Take 1 capsule (300 mg total) by mouth 2 (two) times daily.   meclizine (ANTIVERT) 25 MG tablet Take 1 tablet (25 mg total) by mouth 3 (three) times daily as needed for dizziness.   nitroGLYCERIN (NITROSTAT) 0.4 MG SL tablet Place 1 tablet (0.4 mg total) under the tongue every 5 (five) minutes as needed for chest pain.   pantoprazole (PROTONIX) 40 MG tablet TAKE ONE TABLET BY MOUTH ONCE DAILY   potassium chloride (KLOR-CON) 10 MEQ tablet Take 10 mEq by mouth daily.   rosuvastatin (CRESTOR) 20 MG tablet TAKE 1 TABLET BY MOUTH AT  BEDTIME   sertraline  (ZOLOFT) 50 MG tablet Take 1 tablet (50 mg total) by mouth in the morning.   trolamine salicylate (BLUE-EMU HEMP) 10 % cream Apply 1 application topically daily as needed for muscle pain (Hands).    vitamin B-12 (CYANOCOBALAMIN) 500 MCG tablet Take 500 mcg by mouth daily.   No facility-administered encounter medications on file as of 01/07/2020.    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   Angina pectoris (HCC) 10/07/2019   Atherosclerosis of aorta (HCC) 09/24/2019   BMI 27.0-27.9,adult 09/24/2019   Cellulitis of left hand 07/07/2019   Recurrent falls 06/25/2019   Severe aortic stenosis    Other chest pain    Herpes zoster 05/19/2019   Dyspnea on exertion 06/30/2018   Aortic stenosis 05/02/2017   Bilateral foot-drop 01/18/2017   Spinal stenosis of lumbar region with neurogenic claudication 01/18/2017   Dizziness 04/19/2016   CVA (cerebral vascular accident) (HCC) 04/03/2016   DNR (do not resuscitate) 04/03/2016   Coronary artery disease involving native coronary artery of native heart without angina pectoris 11/01/2015   Dyslipidemia 11/01/2015   Essential hypertension 11/01/2015   Status post coronary artery bypass graft 11/01/2015   Reviewed chart prior to disease state call. Spoke with patient regarding BP  Recent Office Vitals: BP Readings from Last 3 Encounters:  01/04/20 Marland Kitchen)  150/50  11/26/19 (!) 144/70  11/05/19 122/68   Pulse Readings from Last 3 Encounters:  01/04/20 (!) 50  11/26/19 63  11/05/19 67    Wt Readings from Last 3 Encounters:  01/04/20 173 lb 12.8 oz (78.8 kg)  11/26/19 164 lb (74.4 kg)  11/05/19 164 lb (74.4 kg)     Kidney Function Lab Results  Component Value Date/Time   CREATININE 0.74 10/28/2019 05:20 AM   CREATININE 0.80 10/27/2019 09:52 AM   GFRNONAA >60 10/28/2019 05:20 AM   GFRAA >60 10/28/2019  05:20 AM    BMP Latest Ref Rng & Units 10/28/2019 10/27/2019 10/27/2019  Glucose 70 - 99 mg/dL 97 401(U) 272(Z)  BUN 8 - 23 mg/dL 7(L) 11 11  Creatinine 0.61 - 1.24 mg/dL 3.66 4.40 3.47  BUN/Creat Ratio 10 - 24 - - -  Sodium 135 - 145 mmol/L 137 143 144  Potassium 3.5 - 5.1 mmol/L 3.6 3.5 3.4(L)  Chloride 98 - 111 mmol/L 107 105 105  CO2 22 - 32 mmol/L 22 - -  Calcium 8.9 - 10.3 mg/dL 4.2(V) - -    Current antihypertensive regimen:  Vasotec 20 Mg Amlodipine 10 Mg Potassium Chloride 10 meq How often are you checking your Blood Pressure? twice daily, morning and night. Current home BP readings: Readings fluctuate , night of 11/06/19 reading was 124/60 and morning of  11/07/19 reading was 145/75. What recent interventions/DTPs have been made by any provider to improve Blood Pressure control since last CPP Visit: No. Patient states that he is taking medications as prescribed.  Any recent hospitalizations or ED visits since last visit with CPP? Yes, [place] admission 10/27/19-10/28/19. What diet changes have been made to improve Blood Pressure Control?  Patient states that he is not improving his diet to improve Hypertension.  What exercise is being done to improve your Blood Pressure Control?  Patient states that he is not doing much physical activity due to him not being able to stand for a long period of time   Adherence Review: Is the patient currently on ACE/ARB medication? Yes, Enalapril 20 mg Does the patient have >5 day gap between last estimated fill dates? No  Comprehensive medication review performed; Spoke to patient regarding cholesterol  Lipid Panel    Component Value Date/Time   CHOL 127 09/24/2019 0850   TRIG 140 09/24/2019 0850   HDL 35 (L) 09/24/2019 0850   LDLCALC 67 09/24/2019 0850    10-year ASCVD risk score: The ASCVD Risk score Denman George DC Jr., et al., 2013) failed to calculate for the following reasons:   The 2013 ASCVD risk score is only valid for ages 95 to 30    The patient has a prior MI or stroke diagnosis  Current antihyperlipidemic regimen:  zetia 10 mg aspirin ec 81 mg Clopidogrel 75 mg  rosuvastatin 20 mg  Previous antihyperlipidemic medications tried: Simvastatin 80 Mg. ASCVD risk enhancing conditions: age >51, DM, HTN, CKD, CHF, current smoker- Patient does have Hypertension and CHF but, not a current smoker.  What recent interventions/DTPs have been made by any provider to improve Cholesterol control since last CPP Visit: Patient states that he is taking medications as prescribed.  Any recent hospitalizations or ED visits since last visit with CPP? Yes, [place] admission 10/27/19-10/28/19. Adherence Review: Does the patient have >5 day gap between last estimated fill dates? No  Goals Addressed             This Visit's Progress    Pharmacy  Care Plan   On track    CARE PLAN ENTRY  Current Barriers:  Chronic Disease Management support, education, and care coordination needs related to Hypertension and Hyperlipidemia   Hypertension Pharmacist Clinical Goal(s): Over the next 90 days, patient will work with PharmD and providers to maintain BP goal <140/90 Current regimen:  amlodipine 10 mg daily enalapril 20 mg daily Potassium Chloride daily Interventions: Recommend patient continue taking medications as prescribed.  Patient self care activities - Over the next 90 days, patient will: Check BP daily, document, and provide at future appointments Ensure daily salt intake < 2300 mg/day  Hyperlipidemia Pharmacist Clinical Goal(s): Over the next 90 days, patient will work with PharmD and providers to maintain LDL goal < 70 Current regimen:  zetia 10 mg aspirin ec 81 mg Clopidogrel 75 mg daily rosuvastatin 20 mg daily. Interventions: Recommend patient continue to take medications as prescribed.  Patient self care activities - Over the next 90 days, patient will: Continue to follow-up with Dr. Marina Goodell and cardiology and  contact providers as needed. .   Medication management Pharmacist Clinical Goal(s): Over the next 90 days, patient will work with PharmD and providers to achieve optimal medication adherence Current pharmacy: [facility] Interventions Comprehensive medication review performed. Continue current medication management strategy Patient self care activities - Over the next 90 days, patient will: Focus on medication adherence by continuing to take medications as prescribed.  Take medications as prescribed Report any questions or concerns to PharmD and/or provider(s)  Please see past updates related to this goal by clicking on the "Past Updates" button in the selected goal           Follow-Up:  Pharmacist Review   Juliane Lack, CPP. Notified.  Selena Batten  Clinical Pharmacist Assistant  [phone]

## 2020-01-13 ENCOUNTER — Other Ambulatory Visit: Payer: Self-pay

## 2020-01-13 DIAGNOSIS — I10 Essential (primary) hypertension: Secondary | ICD-10-CM

## 2020-01-13 DIAGNOSIS — F339 Major depressive disorder, recurrent, unspecified: Secondary | ICD-10-CM

## 2020-01-13 DIAGNOSIS — K2100 Gastro-esophageal reflux disease with esophagitis, without bleeding: Secondary | ICD-10-CM

## 2020-01-13 MED ORDER — PANTOPRAZOLE SODIUM 40 MG PO TBEC: 40.0000 mg | DELAYED_RELEASE_TABLET | Freq: Every day | ORAL | 0 refills | Status: DC

## 2020-01-13 MED ORDER — POTASSIUM CHLORIDE ER 10 MEQ PO TBCR: 10.0000 meq | EXTENDED_RELEASE_TABLET | Freq: Every day | ORAL | 0 refills | Status: DC

## 2020-01-13 MED ORDER — SERTRALINE HCL 50 MG PO TABS: 50.0000 mg | ORAL_TABLET | Freq: Every morning | ORAL | 1 refills | Status: DC

## 2020-01-13 MED ORDER — AMLODIPINE BESYLATE 10 MG PO TABS: 10.0000 mg | ORAL_TABLET | Freq: Every day | ORAL | 1 refills | Status: DC

## 2020-01-13 NOTE — Telephone Encounter (Signed)
Patient brought his medication in to go thru and get straight.  He needs refills on the following sent to [facility]:  Norvasc 10 mg Protonix 40 mg Klor-Con 10 MEQ Zoloft 50 mg

## 2020-01-25 ENCOUNTER — Ambulatory Visit: Payer: Medicare Other

## 2020-01-25 ENCOUNTER — Other Ambulatory Visit: Payer: Self-pay

## 2020-01-25 ENCOUNTER — Ambulatory Visit (INDEPENDENT_AMBULATORY_CARE_PROVIDER_SITE_OTHER): Payer: Medicare Other

## 2020-01-25 DIAGNOSIS — I10 Essential (primary) hypertension: Secondary | ICD-10-CM

## 2020-01-25 DIAGNOSIS — Z23 Encounter for immunization: Secondary | ICD-10-CM | POA: Diagnosis not present

## 2020-01-25 DIAGNOSIS — E782 Mixed hyperlipidemia: Secondary | ICD-10-CM

## 2020-01-25 MED ORDER — GABAPENTIN 300 MG PO CAPS: 300.0000 mg | ORAL_CAPSULE | Freq: Two times a day (BID) | ORAL | 2 refills | Status: DC

## 2020-01-25 NOTE — Chronic Care Management (AMB) (Signed)
Chronic Care Management Pharmacy  Name: Jack Hodges  MRN: [id number] DOB: Jun 06, 1930  Chief Complaint/ HPI  Jack Hodges,  86 y.o. , male presents for their Follow-Up CCM visit with the clinical pharmacist via telephone due to COVID-19 Pandemic.  PCP : Abigail Miyamoto, MD  Their chronic conditions include: CAD, hx of CVA, HTN, Spinal stenosis, and aortic stenosis.   Office Visits: 09/24/2019 - Anemia,  low platelets but no dangerous- he had this one year ago and recovered, Kidney and liver tests normal, Cholesterol normal, B12 level normal 06/25/2019 - routine follow-up. dTap ordered from Pharmacy.  01/04/2020 - shoulder steroid injection administered. Meclizine for dizziness.   Consult Visit: 12/06/2019 - ok to stop Plavix 5 months out from valve replacment. Continue statin. NYHA class 1 symptoms.  11/05/2019 - Cardiology - aortic valve replacement follow-up visit. Healing well. Continue on aspirin and plavix.  10/27/2019 - aortic valve replacement.   Medications: Outpatient Encounter Medications as of 01/25/2020  Medication Sig  . acetaminophen (TYLENOL) 650 MG CR tablet Take 650 mg by mouth every 8 (eight) hours as needed for pain.   Marland Kitchen amLODipine (NORVASC) 10 MG tablet Take 1 tablet (10 mg total) by mouth daily.  Marland Kitchen aspirin EC 81 MG tablet Take 81 mg by mouth daily.  . clopidogrel (PLAVIX) 75 MG tablet TAKE ONE TABLET BY MOUTH ONCE DAILY WITH BREAKFAST  . enalapril (VASOTEC) 20 MG tablet Take 1 tablet (20 mg total) by mouth daily.  . furosemide (LASIX) 40 MG tablet Take 1 tablet (40 mg total) by mouth daily.  Marland Kitchen gabapentin (NEURONTIN) 300 MG capsule Take 1 capsule (300 mg total) by mouth 2 (two) times daily.  . meclizine (ANTIVERT) 25 MG tablet Take 1 tablet (25 mg total) by mouth 3 (three) times daily as needed for dizziness.  . nitroGLYCERIN (NITROSTAT) 0.4 MG SL tablet Place 1 tablet (0.4 mg total) under the tongue every 5 (five) minutes as needed for chest pain.  .  pantoprazole (PROTONIX) 40 MG tablet Take 1 tablet (40 mg total) by mouth daily.  . potassium chloride (KLOR-CON) 10 MEQ tablet Take 1 tablet (10 mEq total) by mouth daily.  . rosuvastatin (CRESTOR) 20 MG tablet TAKE 1 TABLET BY MOUTH AT  BEDTIME  . sertraline (ZOLOFT) 50 MG tablet Take 1 tablet (50 mg total) by mouth in the morning.  . trolamine salicylate (BLUE-EMU HEMP) 10 % cream Apply 1 application topically daily as needed for muscle pain (Hands).   . vitamin B-12 (CYANOCOBALAMIN) 500 MCG tablet Take 500 mcg by mouth daily.   No facility-administered encounter medications on file as of 01/25/2020.   Allergies  Allergen Reactions  . Poison Oak Extract Rash    SDOH Screenings   Alcohol Screen:   . Last Alcohol Screening Score (AUDIT): Not on file  Depression (PHQ2-9): Low Risk   . PHQ-2 Score: 2  Financial Resource Strain:   . Difficulty of Paying Living Expenses: Not on file  Food Insecurity: No Food Insecurity  . Worried About Programme researcher, broadcasting/film/video in the Last Year: Never true  . Ran Out of Food in the Last Year: Never true  Housing: Low Risk   . Last Housing Risk Score: 0  Physical Activity:   . Days of Exercise per Week: Not on file  . Minutes of Exercise per Session: Not on file  Social Connections:   . Frequency of Communication with Friends and Family: Not on file  . Frequency of Social Gatherings  with Friends and Family: Not on file  . Attends Religious Services: Not on file  . Active Member of Clubs or Organizations: Not on file  . Attends Banker Meetings: Not on file  . Marital Status: Not on file  Stress:   . Feeling of Stress : Not on file  Tobacco Use: Medium Risk  . Smoking Tobacco Use: Former Smoker  . Smokeless Tobacco Use: Never Used  Transportation Needs: No Transportation Needs  . Lack of Transportation (Medical): No  . Lack of Transportation (Non-Medical): No     Current Diagnosis/Assessment:  Goals Addressed            This  Visit's Progress   . Pharmacy Care Plan       CARE PLAN ENTRY  Current Barriers:  . Chronic Disease Management support, education, and care coordination needs related to Hypertension and Hyperlipidemia   Hypertension . Pharmacist Clinical Goal(s): o Over the next 90 days, patient will work with PharmD and providers to maintain BP goal <140/90 . Current regimen:  o amlodipine 10 mg daily o enalapril 20 mg daily o Potassium Chloride daily . Interventions: o Recommend patient continue taking medications as prescribed.  o Reviewed home blood pressure readings.  . Patient self care activities - Over the next 90 days, patient will: o Check BP daily, document, and provide at future appointments o Ensure daily salt intake < 2300 mg/day  Hyperlipidemia . Pharmacist Clinical Goal(s): o Over the next 90 days, patient will work with PharmD and providers to maintain LDL goal < 70 . Current regimen:  o zetia 10 mg o aspirin ec 81 mg o Clopidogrel 75 mg daily o rosuvastatin 20 mg daily. . Interventions: o Recommend patient continue to take medications as prescribed.  . Patient self care activities - Over the next 90 days, patient will: o Continue to follow-up with Dr. Marina Goodell and cardiology and contact providers as needed. .   Medication management . Pharmacist Clinical Goal(s): o Over the next 90 days, patient will work with PharmD and providers to achieve optimal medication adherence . Current pharmacy: [facility] . Interventions o Comprehensive medication review performed. o Continue current medication management strategy . Patient self care activities - Over the next 90 days, patient will: o Focus on medication adherence by continuing to take medications as prescribed.  o Take medications as prescribed o Report any questions or concerns to PharmD and/or provider(s)  Please see past updates related to this goal by clicking on the "Past Updates" button in the selected goal            Hypertension   BP today is:  <130/80  Office blood pressures are  BP Readings from Last 3 Encounters:  01/04/20 (!) 150/50  11/26/19 (!) 144/70  11/05/19 122/68   Lab Results  Component Value Date   CREATININE 0.74 10/28/2019   CREATININE 0.80 10/27/2019   CREATININE 0.90 10/27/2019   Kidney Function Lab Results  Component Value Date/Time   CREATININE 0.74 10/28/2019 05:20 AM   CREATININE 0.80 10/27/2019 09:52 AM   GFRNONAA >60 10/28/2019 05:20 AM   GFRAA >60 10/28/2019 05:20 AM   K 3.6 10/28/2019 05:20 AM   K 3.5 10/27/2019 09:52 AM     Patient has failed these meds in the past: hctz 25 mg daily Patient is currently controlled on the following medications:   amlodipine 10 mg daily  enalapril 20 mg daily  Potassium Chloride daily  Patient checks BP  at home twice daily  Patient home BP readings are ranging: 112/78 mmHg  We discussed: Patient reports good blood pressure and medication adherence. States he has all of his blood pressure medications from mail order.   Plan  Continue current medications   CAD/Hx of CVA/Dyslipidemia   Lipid Panel     Component Value Date/Time   CHOL 127 09/24/2019 0850   TRIG 140 09/24/2019 0850   HDL 35 (L) 09/24/2019 0850   CHOLHDL 3.6 09/24/2019 0850   LDLCALC 67 09/24/2019 0850   LABVLDL 25 09/24/2019 0850     The ASCVD Risk score (Goff DC Jr., et al., 2013) failed to calculate for the following reasons:   The 2013 ASCVD risk score is only valid for ages 35 to 57   The patient has a prior MI or stroke diagnosis   Patient has failed these meds in past: simvastatin, ezetimibe Patient is currently controlled on the following medications:   aspirin ec 81 mg  rosuvastatin 20 mg daily.  Clopidogrel 75 mg daily  Nitroglycerin 0.4 mg tablet SL  We discussed:  Patient cannot exercise due to drop foot and shakiness. He still mows his yard. Patient takes medications and last cholseterol panel was  good. Patient is on clopidogrel for 6 months after valve replacement.   Plan  Continue current medications    and  Spinal Stenosis/Post herpetic neuralgia:    Patient has failed these meds in past: Lyrica, meloxicam 15 mg daily, oxycodone-apap,gabapentin 300 mg 2 caps po bid Patient is currently uncontrolled on the following medications:   Gabapentin 300 mg bid   We discussed:  Patient reports needing a refill of gabapentin at this time. Pharmacist coordinating with mail order pharmacy to ensure patient has needed medication. Patient reports that he was taking 2 twice daily due to hand tingling/pain. He he has resumed bid dosing but reports he is almost out of medication. [facility] mail-order states medication cannot be filled before 11/04 but has no refills. Pharmacist requesting fill for 11/4 from [facility] and refills from Dr. Lorenso Quarry.   Plan  Continue current medications   Heartburn   Patient has failed these meds in past: omeprazole Patient is currently controlled on the following medications:   pantoprazole 40 mg daily   We discussed:  Patient states that heartburn is well controlled with this medication. He has no issue with it since beginning.   States pantroprazole is helping control symptoms well.   Plan  Continue current medications   Mood Stabilizer:   Patient has failed these meds in past: alprazolam 0.5 mg daily prn, trazodone 100 mg qhs, trintellix 5 mg daily Patient is currently controlled on the following medications:   sertraline 50 mg tablet  We discussed:  Patient reports doing well overall just misses his wife terribly. His son lives with him and they continue traditions like an annual fall beach trip fishing.   Plan  Continue current medications   Health Maintenance   Patient is currently controlled on the following medications:   Vitamin b-12 500 mcg - daily Meclizine 25 mg daily prn - dizziness  We discussed:  Patient states that he is not taking  the meclizine often. Pharmacist counseled on somnolence and fall risk with meclizine. Patient reports continued unsteadiness on his feet. He uses a walking cane to help stabilize himself.   Plan  Continue current medications  Vaccines   Reviewed and discussed patient's vaccination history. Hasn't taken COVID vaccine. He's concerned about the new vaccine. Just  received dTap vaccine last Friday.     Immunization History  Administered Date(s) Administered  . Fluad Quad(high Dose 65+) 12/10/2018  . Pneumococcal Conjugate-13 12/20/2017  . Tdap 07/03/2019    Plan  Coordinated flu shot today in office during appointment.   Medication Management   Pt uses [facility] pharmacy for all medications Patient cannot use a pill box due to tremor.   Pt endorses good compliance  We discussed:Pharmacist coordinated refill of gabapentin through [facility]. Patient keeps medication out on kitchen counter since his hands don't do well with pill box.   Plan Pharmacist will monitor patient for adherence and medication efficacy.    Follow up: 6 month

## 2020-01-25 NOTE — Patient Instructions (Signed)
Visit Information  Goals Addressed            This Visit's Progress   . Pharmacy Care Plan       CARE PLAN ENTRY  Current Barriers:  . Chronic Disease Management support, education, and care coordination needs related to Hypertension and Hyperlipidemia   Hypertension . Pharmacist Clinical Goal(s): o Over the next 90 days, patient will work with PharmD and providers to maintain BP goal <140/90 . Current regimen:  o amlodipine 10 mg daily o enalapril 20 mg daily o Potassium Chloride daily . Interventions: o Recommend patient continue taking medications as prescribed.  o Reviewed home blood pressure readings.  . Patient self care activities - Over the next 90 days, patient will: o Check BP daily, document, and provide at future appointments o Ensure daily salt intake < 2300 mg/day  Hyperlipidemia . Pharmacist Clinical Goal(s): o Over the next 90 days, patient will work with PharmD and providers to maintain LDL goal < 70 . Current regimen:  o zetia 10 mg o aspirin ec 81 mg o Clopidogrel 75 mg daily o rosuvastatin 20 mg daily. . Interventions: o Recommend patient continue to take medications as prescribed.  . Patient self care activities - Over the next 90 days, patient will: o Continue to follow-up with Dr. Marina Goodell and cardiology and contact providers as needed. .   Medication management . Pharmacist Clinical Goal(s): o Over the next 90 days, patient will work with PharmD and providers to achieve optimal medication adherence . Current pharmacy: [facility] . Interventions o Comprehensive medication review performed. o Continue current medication management strategy . Patient self care activities - Over the next 90 days, patient will: o Focus on medication adherence by continuing to take medications as prescribed.  o Take medications as prescribed o Report any questions or concerns to PharmD and/or provider(s)  Please see past updates related to this goal by  clicking on the "Past Updates" button in the selected goal          Patient verbalizes understanding of instructions provided today.   Telephone follow up appointment with pharmacy team member scheduled for: May 2022  Juliane Lack, PharmD, [facility] Clinical Pharmacist [facility] [phone] (office) [phone] (mobile)

## 2020-02-10 ENCOUNTER — Other Ambulatory Visit: Payer: Self-pay | Admitting: Legal Medicine

## 2020-02-10 DIAGNOSIS — I10 Essential (primary) hypertension: Secondary | ICD-10-CM

## 2020-02-12 ENCOUNTER — Other Ambulatory Visit: Payer: Self-pay

## 2020-02-12 MED ORDER — FUROSEMIDE 40 MG PO TABS: 40.0000 mg | ORAL_TABLET | Freq: Every day | ORAL | 1 refills | Status: DC

## 2020-02-16 ENCOUNTER — Other Ambulatory Visit: Payer: Self-pay

## 2020-02-16 ENCOUNTER — Ambulatory Visit: Payer: Self-pay

## 2020-02-16 MED ORDER — FUROSEMIDE 40 MG PO TABS: 40.0000 mg | ORAL_TABLET | Freq: Every day | ORAL | 0 refills | Status: DC

## 2020-02-16 NOTE — Chronic Care Management (AMB) (Signed)
Patient let pharmacist know he needed refill of furosemide sent to mail order. Pharmacist coordinated refill of furosemide sent to mail order 02/12/2020. Patient is out of medication and mail-order supply will not arrive until Saturday 11/27. Patient aware and willing to pick up 1 week supply of furosemide at [facility]. Patient was reluctant due to cost difference of local pharmacy vs. Mail-order pharmacy. Pharmacist encouraged importance of taking medication and avoiding being out of furosemide. Patient acknowledges understanding and indicates that he will pick up medication locally.   Juliane Lack, PharmD, [facility] Clinical Pharmacist [facility] [phone] (office) [phone] (mobile)

## 2020-03-11 ENCOUNTER — Encounter: Payer: Self-pay | Admitting: Legal Medicine

## 2020-03-11 ENCOUNTER — Telehealth (INDEPENDENT_AMBULATORY_CARE_PROVIDER_SITE_OTHER): Payer: Medicare Other | Admitting: Legal Medicine

## 2020-03-11 VITALS — BP 96/66 | HR 65 | Temp 99.9°F

## 2020-03-11 DIAGNOSIS — U071 COVID-19: Secondary | ICD-10-CM | POA: Diagnosis not present

## 2020-03-11 DIAGNOSIS — R0981 Nasal congestion: Secondary | ICD-10-CM

## 2020-03-11 DIAGNOSIS — Z8679 Personal history of other diseases of the circulatory system: Secondary | ICD-10-CM | POA: Insufficient documentation

## 2020-03-11 HISTORY — DX: Personal history of other diseases of the circulatory system: Z86.79

## 2020-03-11 LAB — POC COVID19 BINAXNOW: SARS Coronavirus 2 Ag: POSITIVE — AB

## 2020-03-11 NOTE — Progress Notes (Signed)
Virtual Visit via Telephone Note   This visit type was conducted due to national recommendations for restrictions regarding the COVID-19 Pandemic (e.g. social distancing) in an effort to limit this patient's exposure and mitigate transmission in our community.  Due to his co-morbid illnesses, this patient is at least at moderate risk for complications without adequate follow up.  This format is felt to be most appropriate for this patient at this time.  The patient did not have access to video technology/had technical difficulties with video requiring transitioning to audio format only (telephone).  All issues noted in this document were discussed and addressed.  No physical exam could be performed with this format.  Patient verbally consented to a telehealth visit.   Date:  03/11/2020   ID:  Jack Hodges, DOB 05-Jul-1930, MRN [id number]  Patient Location: Home Provider Location: Office/Clinic  PCP:  Abigail Miyamoto, MD   Evaluation Performed:  New Patient Evaluation  Chief Complaint:  Patient having cough and sob  History of Present Illness:    Jack Hodges is a 86 y.o. male with Patient having cough and sob For several days.  He is Covid positive and will be set up for antibodies, O2 sat 89%.  We discussed treatment.   The patient does have symptoms concerning for COVID-19 infection (fever, chills, cough, or new shortness of breath).    Past Medical History:  Diagnosis Date  . Arthritis   . Bilateral foot-drop 01/18/2017  . Cellulitis of left hand 07/07/2019  . Coronary artery disease involving native coronary artery of native heart without angina pectoris 11/01/2015   Overview:  Bypass surgery in 1996 Overview:  Overview:  Bypass surgery in 1996  Last Assessment & Plan:  Continue his current regimen.  Follow up as noted.  . CVA (cerebral vascular accident) (HCC) 04/03/2016   Last Assessment & Plan:  The patient underwent extensive CVA workup.  MRI is negative.  He does  have aortic stenosis by echocardiogram.  Upon review of Care everywhere he is followed by Dr. Bing Matter, cardiologist for this.  Carotid ultrasound negative for hemodynamically significant stenosis.  Lipid panel within normal limits.  He has not had any further episodes of dizziness or nausea vomiting.   . Dementia (HCC)   . Depression   . Dizziness 04/19/2016  . DNR (do not resuscitate) 04/03/2016   Overview:  I had an extensive discussion with the patient on 04/03/2016 regarding his end of life wishes.  He and his daughter agree that do not resuscitate is in keeping with his beliefs.  . Dyslipidemia 11/01/2015   Last Assessment & Plan:  Formatting of this note might be different from the original. Cholesterol 136   Triglycerides  83  HDL  50.1  LDL Calculated  69  VLDL Cholesterol Cal  17   Continue statin.  Marland Kitchen Dyspnea    with activity  . Essential hypertension 11/01/2015   Last Assessment & Plan:  Resume home medications.  Follow-up as noted above.  Marland Kitchen GERD (gastroesophageal reflux disease)   . Herpes zoster 05/19/2019  . Myocardial infarction (HCC)    approx 1994  . Neuropathy   . Other chest pain   . Recurrent falls 06/25/2019  . 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 Severe aortic stenosis   . Sleep apnea   . 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    Past Surgical History:  Procedure Laterality Date  . CARDIAC CATHETERIZATION    . CARDIAC SURGERY    . CATARACT EXTRACTION, BILATERAL    . CORONARY ARTERY BYPASS GRAFT    . CORONARY STENT INTERVENTION N/A 10/07/2019   Procedure: CORONARY STENT INTERVENTION;  Surgeon: Tonny Bollman, MD;  Location: [facility] INVASIVE CV LAB;  Service: Cardiovascular;  Laterality: N/A;  . EYE SURGERY    .  HEMORROIDECTOMY    . RIGHT/LEFT HEART CATH AND CORONARY/GRAFT ANGIOGRAPHY N/A 10/07/2019   Procedure: RIGHT/LEFT HEART CATH AND CORONARY/GRAFT ANGIOGRAPHY;  Surgeon: Tonny Bollman, MD;  Location: [facility] INVASIVE CV LAB;  Service: Cardiovascular;  Laterality: N/A;  . TEE WITHOUT CARDIOVERSION N/A 10/27/2019   Procedure: TRANSESOPHAGEAL ECHOCARDIOGRAM (TEE);  Surgeon: Kathleene Hazel, MD;  Location: [facility] INVASIVE CV LAB;  Service: Open Heart Surgery;  Laterality: N/A;  . TRANSCATHETER AORTIC VALVE REPLACEMENT, TRANSFEMORAL N/A 10/27/2019   Procedure: TRANSCATHETER AORTIC VALVE REPLACEMENT, TRANSFEMORAL;  Surgeon: Kathleene Hazel, MD;  Location: [facility] INVASIVE CV LAB;  Service: Open Heart Surgery;  Laterality: N/A;    Family History  Problem Relation Age of Onset  . Cancer Mother   . Diabetes Mother   . Kidney disease Mother   . Stroke Mother   . Sleep disorder Mother   . Arthritis Father   . Migraines Father   . Hypertension Father   . Sleep disorder Father   . Arthritis Brother   . Cancer Brother     Social History   Socioeconomic History  . Marital status: Widowed    Spouse name: Not on file  . Number of children: 2  . Years of education: Not on file  . Highest education level: Not on file  Occupational History  . Occupation: retired  Tobacco Use  . Smoking status: Former Smoker    Types: Cigarettes, Cigars    Quit date: 05/02/1962    Years since quitting: 57.8  . Smokeless tobacco: Never Used  Vaping Use  . Vaping Use: Never used  Substance and Sexual Activity  . Alcohol use: No  . Drug use: No  . Sexual activity: Not Currently  Other Topics Concern  . Not on file  Social History Narrative  . Not on file   Social Determinants of Health   Financial Resource Strain: Not on file  Food Insecurity: No Food Insecurity  . Worried About Programme researcher, broadcasting/film/video in the Last Year: Never true  . Ran Out of Food in the Last Year: Never true  Transportation Needs: No  Transportation Needs  . Lack of Transportation (Medical): No  . Lack of Transportation (Non-Medical): No  Physical Activity: Not on file  Stress: Not on file  Social Connections: Not on file  Intimate Partner Violence: Not on file    Outpatient Medications Prior to Visit  Medication Sig Dispense Refill  . acetaminophen (TYLENOL) 650 MG CR tablet Take 650 mg by mouth every 8 (eight) hours as needed for pain.     Marland Kitchen amLODipine (NORVASC) 10 MG tablet Take 1 tablet (10 mg total) by mouth daily. 90 tablet 1  . aspirin EC 81 MG tablet Take 81 mg by mouth daily.    . clopidogrel (PLAVIX) 75 MG tablet TAKE ONE TABLET BY MOUTH ONCE DAILY WITH BREAKFAST 90 tablet 3  . enalapril (VASOTEC) 20 MG tablet TAKE 1 TABLET BY MOUTH  DAILY 90  tablet 2  . furosemide (LASIX) 40 MG tablet Take 1 tablet (40 mg total) by mouth daily. 30 tablet 0  . gabapentin (NEURONTIN) 300 MG capsule Take 1 capsule (300 mg total) by mouth 2 (two) times daily. 180 capsule 2  . meclizine (ANTIVERT) 25 MG tablet Take 1 tablet (25 mg total) by mouth 3 (three) times daily as needed for dizziness. 30 tablet 0  . nitroGLYCERIN (NITROSTAT) 0.4 MG SL tablet Place 1 tablet (0.4 mg total) under the tongue every 5 (five) minutes as needed for chest pain. 25 tablet 2  . pantoprazole (PROTONIX) 40 MG tablet Take 1 tablet (40 mg total) by mouth daily. 90 tablet 0  . potassium chloride (KLOR-CON) 10 MEQ tablet Take 1 tablet (10 mEq total) by mouth daily. 90 tablet 0  . rosuvastatin (CRESTOR) 20 MG tablet TAKE 1 TABLET BY MOUTH AT  BEDTIME 90 tablet 2  . sertraline (ZOLOFT) 50 MG tablet Take 1 tablet (50 mg total) by mouth in the morning. 90 tablet 1  . trolamine salicylate (ASPERCREME) 10 % cream Apply 1 application topically daily as needed for muscle pain (Hands).     . vitamin B-12 (CYANOCOBALAMIN) 500 MCG tablet Take 500 mcg by mouth daily.     No facility-administered medications prior to visit.    Allergies:   Poison oak extract    Social History   Tobacco Use  . Smoking status: Former Smoker    Types: Cigarettes, Cigars    Quit date: 05/02/1962    Years since quitting: 57.8  . Smokeless tobacco: Never Used  Vaping Use  . Vaping Use: Never used  Substance Use Topics  . Alcohol use: No  . Drug use: No     Review of Systems  Constitutional: Positive for malaise/fatigue.  HENT: Negative for congestion, hearing loss and sinus pain.   Eyes: Negative for redness.  Respiratory: Positive for cough, sputum production and shortness of breath.   Cardiovascular: Positive for PND. Negative for chest pain.  Genitourinary: Negative for dysuria.  Musculoskeletal: Positive for myalgias.  Neurological: Positive for headaches.     Labs/Other Tests and Data Reviewed:    Recent Labs: 10/23/2019: ALT 16; B Natriuretic Peptide 108.8 10/28/2019: BUN 7; Creatinine, Ser 0.74; Hemoglobin 10.8; Magnesium 1.7; Platelets 77; Potassium 3.6; Sodium 137   Recent Lipid Panel Lab Results  Component Value Date/Time   CHOL 127 09/24/2019 08:50 AM   TRIG 140 09/24/2019 08:50 AM   HDL 35 (L) 09/24/2019 08:50 AM   CHOLHDL 3.6 09/24/2019 08:50 AM   LDLCALC 67 09/24/2019 08:50 AM    Wt Readings from Last 3 Encounters:  01/04/20 173 lb 12.8 oz (78.8 kg)  11/26/19 164 lb (74.4 kg)  11/05/19 164 lb (74.4 kg)     Objective:    Vital Signs:  BP 96/66   Pulse 65   Temp 99.9 F (37.7 C)    Physical Exam na  ASSESSMENT & PLAN:   1. Nose congestion - POC COVID-19 BinaxNow Patient is Covid positive and will be sent to infusion center.      Orders Placed This Encounter  Procedures  . POC COVID-19 BinaxNow     No orders of the defined types were placed in this encounter.   COVID-19 Education: The signs and symptoms of COVID-19 were discussed with the patient and how to seek care for testing (follow up with PCP or arrange E-visit). The importance of social distancing was discussed today.   I spent 20 minutes dedicated  to  the care of this patient on the date of this encounter to include face-to-face time with the patient, as well as: referral to infusion center  Follow Up:  In Person prn  Signed,  Brent Bulla, MD  03/11/2020 11:14 AM    [facility]

## 2020-03-12 ENCOUNTER — Inpatient Hospital Stay (HOSPITAL_COMMUNITY)
Admission: EM | Admit: 2020-03-12 | Discharge: 2020-03-17 | DRG: 177 | Disposition: A | Discharge status: Home Health | Payer: Medicare Other | Attending: Internal Medicine | Admitting: Internal Medicine

## 2020-03-12 ENCOUNTER — Other Ambulatory Visit: Payer: Self-pay

## 2020-03-12 ENCOUNTER — Encounter (HOSPITAL_COMMUNITY): Payer: Self-pay | Admitting: Emergency Medicine

## 2020-03-12 ENCOUNTER — Telehealth: Payer: Self-pay | Admitting: Infectious Diseases

## 2020-03-12 ENCOUNTER — Emergency Department (HOSPITAL_COMMUNITY): Payer: Medicare Other

## 2020-03-12 DIAGNOSIS — Z79899 Other long term (current) drug therapy: Secondary | ICD-10-CM | POA: Diagnosis not present

## 2020-03-12 DIAGNOSIS — J1282 Pneumonia due to coronavirus disease 2019: Secondary | ICD-10-CM | POA: Diagnosis not present

## 2020-03-12 DIAGNOSIS — E872 Acidosis: Secondary | ICD-10-CM | POA: Diagnosis present

## 2020-03-12 DIAGNOSIS — Z7902 Long term (current) use of antithrombotics/antiplatelets: Secondary | ICD-10-CM

## 2020-03-12 DIAGNOSIS — E785 Hyperlipidemia, unspecified: Secondary | ICD-10-CM | POA: Diagnosis present

## 2020-03-12 DIAGNOSIS — D696 Thrombocytopenia, unspecified: Secondary | ICD-10-CM | POA: Diagnosis present

## 2020-03-12 DIAGNOSIS — R531 Weakness: Secondary | ICD-10-CM

## 2020-03-12 DIAGNOSIS — J9601 Acute respiratory failure with hypoxia: Secondary | ICD-10-CM | POA: Diagnosis not present

## 2020-03-12 DIAGNOSIS — G629 Polyneuropathy, unspecified: Secondary | ICD-10-CM | POA: Diagnosis not present

## 2020-03-12 DIAGNOSIS — I10 Essential (primary) hypertension: Secondary | ICD-10-CM | POA: Diagnosis present

## 2020-03-12 DIAGNOSIS — Z66 Do not resuscitate: Secondary | ICD-10-CM | POA: Diagnosis present

## 2020-03-12 DIAGNOSIS — N179 Acute kidney failure, unspecified: Secondary | ICD-10-CM | POA: Diagnosis not present

## 2020-03-12 DIAGNOSIS — Z7982 Long term (current) use of aspirin: Secondary | ICD-10-CM | POA: Diagnosis not present

## 2020-03-12 DIAGNOSIS — I252 Old myocardial infarction: Secondary | ICD-10-CM

## 2020-03-12 DIAGNOSIS — Z951 Presence of aortocoronary bypass graft: Secondary | ICD-10-CM

## 2020-03-12 DIAGNOSIS — A0839 Other viral enteritis: Secondary | ICD-10-CM | POA: Diagnosis not present

## 2020-03-12 DIAGNOSIS — R296 Repeated falls: Secondary | ICD-10-CM | POA: Diagnosis present

## 2020-03-12 DIAGNOSIS — H9190 Unspecified hearing loss, unspecified ear: Secondary | ICD-10-CM | POA: Diagnosis present

## 2020-03-12 DIAGNOSIS — Z8261 Family history of arthritis: Secondary | ICD-10-CM

## 2020-03-12 DIAGNOSIS — M1990 Unspecified osteoarthritis, unspecified site: Secondary | ICD-10-CM | POA: Diagnosis not present

## 2020-03-12 DIAGNOSIS — Z8673 Personal history of transient ischemic attack (TIA), and cerebral infarction without residual deficits: Secondary | ICD-10-CM

## 2020-03-12 DIAGNOSIS — D649 Anemia, unspecified: Secondary | ICD-10-CM | POA: Diagnosis present

## 2020-03-12 DIAGNOSIS — G4730 Sleep apnea, unspecified: Secondary | ICD-10-CM | POA: Diagnosis not present

## 2020-03-12 DIAGNOSIS — A09 Infectious gastroenteritis and colitis, unspecified: Secondary | ICD-10-CM | POA: Diagnosis not present

## 2020-03-12 DIAGNOSIS — K219 Gastro-esophageal reflux disease without esophagitis: Secondary | ICD-10-CM | POA: Diagnosis not present

## 2020-03-12 DIAGNOSIS — U071 COVID-19: Secondary | ICD-10-CM | POA: Diagnosis not present

## 2020-03-12 DIAGNOSIS — F0390 Unspecified dementia without behavioral disturbance: Secondary | ICD-10-CM | POA: Diagnosis present

## 2020-03-12 DIAGNOSIS — Z953 Presence of xenogenic heart valve: Secondary | ICD-10-CM

## 2020-03-12 DIAGNOSIS — R197 Diarrhea, unspecified: Secondary | ICD-10-CM | POA: Diagnosis not present

## 2020-03-12 DIAGNOSIS — Z8249 Family history of ischemic heart disease and other diseases of the circulatory system: Secondary | ICD-10-CM

## 2020-03-12 DIAGNOSIS — F32A Depression, unspecified: Secondary | ICD-10-CM | POA: Diagnosis present

## 2020-03-12 DIAGNOSIS — I2510 Atherosclerotic heart disease of native coronary artery without angina pectoris: Secondary | ICD-10-CM | POA: Diagnosis not present

## 2020-03-12 DIAGNOSIS — J90 Pleural effusion, not elsewhere classified: Secondary | ICD-10-CM | POA: Diagnosis not present

## 2020-03-12 LAB — URINALYSIS, ROUTINE W REFLEX MICROSCOPIC
Glucose, UA: 50 mg/dL — AB
Hgb urine dipstick: NEGATIVE
Ketones, ur: 20 mg/dL — AB
Leukocytes,Ua: NEGATIVE
Nitrite: NEGATIVE
Protein, ur: 100 mg/dL — AB
Specific Gravity, Urine: 1.025 (ref 1.005–1.030)
pH: 5 (ref 5.0–8.0)

## 2020-03-12 LAB — CBC
HCT: 34.8 % — ABNORMAL LOW (ref 39.0–52.0)
Hemoglobin: 12.3 g/dL — ABNORMAL LOW (ref 13.0–17.0)
MCH: 29.7 pg (ref 26.0–34.0)
MCHC: 35.3 g/dL (ref 30.0–36.0)
MCV: 84.1 fL (ref 80.0–100.0)
Platelets: 89 10*3/uL — ABNORMAL LOW (ref 150–400)
RBC: 4.14 MIL/uL — ABNORMAL LOW (ref 4.22–5.81)
RDW: 16 % — ABNORMAL HIGH (ref 11.5–15.5)
WBC: 8.5 10*3/uL (ref 4.0–10.5)
nRBC: 0 % (ref 0.0–0.2)

## 2020-03-12 LAB — BASIC METABOLIC PANEL
Anion gap: 16 — ABNORMAL HIGH (ref 5–15)
BUN: 14 mg/dL (ref 8–23)
CO2: 17 mmol/L — ABNORMAL LOW (ref 22–32)
Calcium: 9.1 mg/dL (ref 8.9–10.3)
Chloride: 102 mmol/L (ref 98–111)
Creatinine, Ser: 1.15 mg/dL (ref 0.61–1.24)
GFR, Estimated: >60 mL/min (ref >60)
Glucose, Bld: 136 mg/dL — ABNORMAL HIGH (ref 70–99)
Potassium: 3.7 mmol/L (ref 3.5–5.1)
Sodium: 135 mmol/L (ref 135–145)

## 2020-03-12 NOTE — ED Triage Notes (Addendum)
Pt reports generalized weakness x 1 1/2 weeks.  Tested + for COVID on Friday.  Denies pain and SOB however pt's breathing is slightly labored.

## 2020-03-12 NOTE — Telephone Encounter (Signed)
Called to Discuss with patient about Covid symptoms and the use of the monoclonal antibody infusion for those with mild to moderate Covid symptoms and at a high risk of hospitalization.     Unable to reach pt - it appears he is in the ER for evaluation.   Although he has medical risk factors present he has been ill > 7 days (will be beyond day 10 at next available monoclonal antibody appointment).   Due to rapid changes in supply, protocols and algorithms that pertain to the evaluation of at risk patients for COVID-19 have been changed to reflect prioritization criteria. At this time we are not able to offer monoclonal antibody infusion to the patient. It appears he is now progressed to more moderate-severe symptoms and would not receive benefit from monoclonal antibody.   Rexene Alberts, MSN, NP-C [facility] for Infectious Disease [facility]  [email or web address] Pager: [phone] Office: [phone] [facility] Main Line: [phone]

## 2020-03-13 DIAGNOSIS — F32A Depression, unspecified: Secondary | ICD-10-CM | POA: Diagnosis present

## 2020-03-13 DIAGNOSIS — I252 Old myocardial infarction: Secondary | ICD-10-CM | POA: Diagnosis not present

## 2020-03-13 DIAGNOSIS — E872 Acidosis: Secondary | ICD-10-CM

## 2020-03-13 DIAGNOSIS — I350 Nonrheumatic aortic (valve) stenosis: Secondary | ICD-10-CM | POA: Insufficient documentation

## 2020-03-13 DIAGNOSIS — A0839 Other viral enteritis: Secondary | ICD-10-CM | POA: Diagnosis present

## 2020-03-13 DIAGNOSIS — I2510 Atherosclerotic heart disease of native coronary artery without angina pectoris: Secondary | ICD-10-CM | POA: Diagnosis present

## 2020-03-13 DIAGNOSIS — Z7902 Long term (current) use of antithrombotics/antiplatelets: Secondary | ICD-10-CM | POA: Diagnosis not present

## 2020-03-13 DIAGNOSIS — D696 Thrombocytopenia, unspecified: Secondary | ICD-10-CM | POA: Diagnosis present

## 2020-03-13 DIAGNOSIS — I10 Essential (primary) hypertension: Secondary | ICD-10-CM | POA: Diagnosis present

## 2020-03-13 DIAGNOSIS — J1282 Pneumonia due to coronavirus disease 2019: Secondary | ICD-10-CM | POA: Diagnosis present

## 2020-03-13 DIAGNOSIS — R531 Weakness: Secondary | ICD-10-CM | POA: Diagnosis present

## 2020-03-13 DIAGNOSIS — Z7982 Long term (current) use of aspirin: Secondary | ICD-10-CM | POA: Diagnosis not present

## 2020-03-13 DIAGNOSIS — U071 COVID-19: Principal | ICD-10-CM | POA: Diagnosis present

## 2020-03-13 DIAGNOSIS — R296 Repeated falls: Secondary | ICD-10-CM | POA: Diagnosis present

## 2020-03-13 DIAGNOSIS — K219 Gastro-esophageal reflux disease without esophagitis: Secondary | ICD-10-CM | POA: Diagnosis present

## 2020-03-13 DIAGNOSIS — F0390 Unspecified dementia without behavioral disturbance: Secondary | ICD-10-CM | POA: Diagnosis present

## 2020-03-13 DIAGNOSIS — R197 Diarrhea, unspecified: Secondary | ICD-10-CM | POA: Diagnosis not present

## 2020-03-13 DIAGNOSIS — D649 Anemia, unspecified: Secondary | ICD-10-CM | POA: Diagnosis present

## 2020-03-13 DIAGNOSIS — E785 Hyperlipidemia, unspecified: Secondary | ICD-10-CM | POA: Diagnosis present

## 2020-03-13 DIAGNOSIS — A09 Infectious gastroenteritis and colitis, unspecified: Secondary | ICD-10-CM | POA: Diagnosis not present

## 2020-03-13 DIAGNOSIS — N179 Acute kidney failure, unspecified: Secondary | ICD-10-CM | POA: Diagnosis present

## 2020-03-13 DIAGNOSIS — G4730 Sleep apnea, unspecified: Secondary | ICD-10-CM | POA: Diagnosis present

## 2020-03-13 DIAGNOSIS — Z66 Do not resuscitate: Secondary | ICD-10-CM | POA: Diagnosis present

## 2020-03-13 DIAGNOSIS — M1990 Unspecified osteoarthritis, unspecified site: Secondary | ICD-10-CM | POA: Diagnosis present

## 2020-03-13 DIAGNOSIS — H9190 Unspecified hearing loss, unspecified ear: Secondary | ICD-10-CM | POA: Diagnosis present

## 2020-03-13 DIAGNOSIS — G629 Polyneuropathy, unspecified: Secondary | ICD-10-CM | POA: Diagnosis present

## 2020-03-13 DIAGNOSIS — Z79899 Other long term (current) drug therapy: Secondary | ICD-10-CM | POA: Diagnosis not present

## 2020-03-13 DIAGNOSIS — J9601 Acute respiratory failure with hypoxia: Secondary | ICD-10-CM | POA: Diagnosis present

## 2020-03-13 HISTORY — DX: Pneumonia due to coronavirus disease 2019: J12.82

## 2020-03-13 HISTORY — DX: Weakness: R53.1

## 2020-03-13 HISTORY — DX: COVID-19: U07.1

## 2020-03-13 HISTORY — DX: Thrombocytopenia, unspecified: D69.6

## 2020-03-13 LAB — CBC
HCT: 35.2 % — ABNORMAL LOW (ref 39.0–52.0)
Hemoglobin: 11.8 g/dL — ABNORMAL LOW (ref 13.0–17.0)
MCH: 28.5 pg (ref 26.0–34.0)
MCHC: 33.5 g/dL (ref 30.0–36.0)
MCV: 85 fL (ref 80.0–100.0)
Platelets: 99 10*3/uL — ABNORMAL LOW (ref 150–400)
RBC: 4.14 MIL/uL — ABNORMAL LOW (ref 4.22–5.81)
RDW: 16.2 % — ABNORMAL HIGH (ref 11.5–15.5)
WBC: 11.6 10*3/uL — ABNORMAL HIGH (ref 4.0–10.5)
nRBC: 0 % (ref 0.0–0.2)

## 2020-03-13 LAB — PROCALCITONIN: Procalcitonin: 0.16 ng/mL

## 2020-03-13 LAB — COMPREHENSIVE METABOLIC PANEL
ALT: 26 U/L (ref 0–44)
AST: 53 U/L — ABNORMAL HIGH (ref 15–41)
Albumin: 3.3 g/dL — ABNORMAL LOW (ref 3.5–5.0)
Alkaline Phosphatase: 64 U/L (ref 38–126)
Anion gap: 15 (ref 5–15)
BUN: 16 mg/dL (ref 8–23)
CO2: 17 mmol/L — ABNORMAL LOW (ref 22–32)
Calcium: 8.7 mg/dL — ABNORMAL LOW (ref 8.9–10.3)
Chloride: 107 mmol/L (ref 98–111)
Creatinine, Ser: 0.87 mg/dL (ref 0.61–1.24)
GFR, Estimated: >60 mL/min (ref >60)
Glucose, Bld: 120 mg/dL — ABNORMAL HIGH (ref 70–99)
Potassium: 3.2 mmol/L — ABNORMAL LOW (ref 3.5–5.1)
Sodium: 139 mmol/L (ref 135–145)
Total Bilirubin: 1.1 mg/dL (ref 0.3–1.2)
Total Protein: 6.1 g/dL — ABNORMAL LOW (ref 6.5–8.1)

## 2020-03-13 LAB — HEPATIC FUNCTION PANEL
ALT: 27 U/L (ref 0–44)
AST: 49 U/L — ABNORMAL HIGH (ref 15–41)
Albumin: 3.3 g/dL — ABNORMAL LOW (ref 3.5–5.0)
Alkaline Phosphatase: 56 U/L (ref 38–126)
Bilirubin, Direct: 0.4 mg/dL — ABNORMAL HIGH (ref 0.0–0.2)
Indirect Bilirubin: 1.4 mg/dL — ABNORMAL HIGH (ref 0.3–0.9)
Total Bilirubin: 1.8 mg/dL — ABNORMAL HIGH (ref 0.3–1.2)
Total Protein: 6.2 g/dL — ABNORMAL LOW (ref 6.5–8.1)

## 2020-03-13 LAB — D-DIMER, QUANTITATIVE: D-Dimer, Quant: 2.17 ug/mL-FEU — ABNORMAL HIGH (ref 0.00–0.50)

## 2020-03-13 LAB — FIBRINOGEN: Fibrinogen: 554 mg/dL — ABNORMAL HIGH (ref 210–475)

## 2020-03-13 LAB — MAGNESIUM: Magnesium: 1.8 mg/dL (ref 1.7–2.4)

## 2020-03-13 LAB — C-REACTIVE PROTEIN: CRP: 6.1 mg/dL — ABNORMAL HIGH (ref <1.0)

## 2020-03-13 LAB — LACTATE DEHYDROGENASE: LDH: 391 U/L — ABNORMAL HIGH (ref 98–192)

## 2020-03-13 LAB — FERRITIN: Ferritin: 651 ng/mL — ABNORMAL HIGH (ref 24–336)

## 2020-03-13 LAB — LACTIC ACID, PLASMA
Lactic Acid, Venous: 1.3 mmol/L (ref 0.5–1.9)
Lactic Acid, Venous: 0.9 mmol/L (ref 0.5–1.9)

## 2020-03-13 LAB — TRIGLYCERIDES: Triglycerides: 106 mg/dL (ref <150)

## 2020-03-13 MED ORDER — IPRATROPIUM-ALBUTEROL 20-100 MCG/ACT IN AERS
1.0000 | INHALATION_SPRAY | Freq: Four times a day (QID) | RESPIRATORY_TRACT | Status: DC
  Administered 2020-03-13 – 2020-03-17 (×15): 1 via RESPIRATORY_TRACT
  Filled 2020-03-13: qty 4

## 2020-03-13 MED ORDER — LOPERAMIDE HCL 2 MG PO CAPS
2.0000 mg | ORAL_CAPSULE | ORAL | Status: DC | PRN
  Administered 2020-03-13: 12:00:00 2 mg via ORAL
  Filled 2020-03-13: qty 1

## 2020-03-13 MED ORDER — POTASSIUM CHLORIDE CRYS ER 20 MEQ PO TBCR
40.0000 meq | EXTENDED_RELEASE_TABLET | Freq: Once | ORAL | Status: AC
  Administered 2020-03-13: 04:00:00 40 meq via ORAL
  Filled 2020-03-13: qty 2

## 2020-03-13 MED ORDER — PROCHLORPERAZINE EDISYLATE 10 MG/2ML IJ SOLN: 5.0000 mg | Freq: Four times a day (QID) | INTRAMUSCULAR | Status: DC | PRN

## 2020-03-13 MED ORDER — ONDANSETRON HCL 4 MG/2ML IJ SOLN: 4.0000 mg | Freq: Once | INTRAMUSCULAR | Status: DC

## 2020-03-13 MED ORDER — SODIUM CHLORIDE 0.9 % IV BOLUS (SEPSIS): 500.0000 mL | Freq: Once | INTRAVENOUS | Status: DC

## 2020-03-13 MED ORDER — SODIUM CHLORIDE 0.9 % IV SOLN
200.0000 mg | Freq: Once | INTRAVENOUS | Status: AC
  Administered 2020-03-13: 05:00:00 200 mg via INTRAVENOUS
  Filled 2020-03-13: qty 40

## 2020-03-13 MED ORDER — HYDROCOD POLST-CPM POLST ER 10-8 MG/5ML PO SUER: 5.0000 mL | Freq: Two times a day (BID) | ORAL | Status: DC | PRN

## 2020-03-13 MED ORDER — ZINC SULFATE 220 (50 ZN) MG PO CAPS
220.0000 mg | ORAL_CAPSULE | Freq: Every day | ORAL | Status: DC
  Administered 2020-03-13 – 2020-03-17 (×5): 220 mg via ORAL
  Filled 2020-03-13 (×5): qty 1

## 2020-03-13 MED ORDER — METHYLPREDNISOLONE SODIUM SUCC 125 MG IJ SOLR
80.0000 mg | Freq: Two times a day (BID) | INTRAMUSCULAR | Status: DC
  Administered 2020-03-13 – 2020-03-17 (×9): 80 mg via INTRAVENOUS
  Filled 2020-03-13 (×9): qty 2

## 2020-03-13 MED ORDER — SODIUM CHLORIDE 0.9 % IV SOLN: INTRAVENOUS | Status: DC

## 2020-03-13 MED ORDER — ASCORBIC ACID 500 MG PO TABS
500.0000 mg | ORAL_TABLET | Freq: Every day | ORAL | Status: DC
  Administered 2020-03-13 – 2020-03-17 (×5): 500 mg via ORAL
  Filled 2020-03-13 (×5): qty 1

## 2020-03-13 MED ORDER — HYDROCOD POLST-CPM POLST ER 10-8 MG/5ML PO SUER
5.0000 mL | Freq: Two times a day (BID) | ORAL | Status: DC
  Administered 2020-03-13 – 2020-03-17 (×9): 5 mL via ORAL
  Filled 2020-03-13 (×9): qty 5

## 2020-03-13 MED ORDER — ALBUTEROL SULFATE HFA 108 (90 BASE) MCG/ACT IN AERS
2.0000 | INHALATION_SPRAY | RESPIRATORY_TRACT | Status: DC | PRN
  Filled 2020-03-13: qty 6.7

## 2020-03-13 MED ORDER — VITAMIN D 25 MCG (1000 UNIT) PO TABS
1000.0000 [IU] | ORAL_TABLET | Freq: Every day | ORAL | Status: DC
  Administered 2020-03-13 – 2020-03-17 (×5): 1000 [IU] via ORAL
  Filled 2020-03-13 (×5): qty 1

## 2020-03-13 MED ORDER — SODIUM CHLORIDE 0.9 % IV SOLN
100.0000 mg | Freq: Every day | INTRAVENOUS | Status: AC
  Administered 2020-03-14 – 2020-03-17 (×4): 100 mg via INTRAVENOUS
  Filled 2020-03-13 (×4): qty 20

## 2020-03-13 MED ORDER — ENOXAPARIN SODIUM 40 MG/0.4ML ~~LOC~~ SOLN
40.0000 mg | SUBCUTANEOUS | Status: DC
  Administered 2020-03-13 – 2020-03-16 (×4): 40 mg via SUBCUTANEOUS
  Filled 2020-03-13 (×4): qty 0.4

## 2020-03-13 MED ORDER — GUAIFENESIN-DM 100-10 MG/5ML PO SYRP
10.0000 mL | ORAL_SOLUTION | ORAL | Status: DC | PRN
  Administered 2020-03-13 – 2020-03-17 (×2): 10 mL via ORAL
  Filled 2020-03-13 (×2): qty 10

## 2020-03-13 NOTE — H&P (Signed)
History and Physical    [name] WUJ:[id number] DOB: 24-Aug-1930 DOA: 03/12/2020  PCP: Jack Miyamoto, MD Patient coming from: Home  Chief Complaint: Generalized weakness  HPI: Jack Hodges is a 86 y.o. male with medical history significant of CAD status post CABG, severe aortic stenosis status post TAVR, CVA, dementia, depression, hypertension, hyperlipidemia, GERD, history of recurrent falls presenting to the ED with a chief complaint of generalized weakness.  Patient tested positive for Covid 2 days ago (SARS-CoV-2 antigen test positive on 03/11/2020).  History limited as patient is hard of hearing.  Reports feeling ill for the past 3 weeks.  He is having fevers, generalized weakness, cough, shortness of breath, nausea, vomiting, poor oral intake, and diarrhea.  Denies chest pain.  Denies abdominal pain or dysuria.  He is not vaccinated against Covid.  ED Course: Afebrile.  Tachypneic with heart rate in the 20s but not hypoxic at rest.  Attempt was made in the ED to ambulate the patient to check his pulse ox but patient was not even able to stand on his own without significant assistance.  WBC 8.5, hemoglobin 12.3 (at baseline), platelet count 89K (at baseline).  Sodium 135, potassium 3.7, chloride 102, bicarb 17, anion gap 16, BUN 14, creatinine 1.1, glucose 136.  UA with small amount of ketones, protein, negative nitrite, negative leukocytes, 6-10 WBCs, and few bacteria.  Blood culture x2 pending.  Lactic acid level pending.  Magnesium level pending.  Procalcitonin level pending.  Inflammatory markers including ferritin, fibrinogen, D-dimer, LDH, and CRP pending.  Hepatic function panel pending.  Chest x-ray showing left basilar opacity concerning for pneumonia.  Review of Systems:  All systems reviewed and apart from history of presenting illness, are negative.  Past Medical History:  Diagnosis Date  . Arthritis   . Bilateral foot-drop 01/18/2017  . Cellulitis of left hand  07/07/2019  . Coronary artery disease involving native coronary artery of native heart without angina pectoris 11/01/2015   Overview:  Bypass surgery in 1996 Overview:  Overview:  Bypass surgery in 1996  Last Assessment & Plan:  Continue his current regimen.  Follow up as noted.  . CVA (cerebral vascular accident) (HCC) 04/03/2016   Last Assessment & Plan:  The patient underwent extensive CVA workup.  MRI is negative.  He does have aortic stenosis by echocardiogram.  Upon review of Care everywhere he is followed by Dr. Bing Matter, cardiologist for this.  Carotid ultrasound negative for hemodynamically significant stenosis.  Lipid panel within normal limits.  He has not had any further episodes of dizziness or nausea vomiting.   . Dementia (HCC)   . Depression   . Dizziness 04/19/2016  . DNR (do not resuscitate) 04/03/2016   Overview:  I had an extensive discussion with the patient on 04/03/2016 regarding his end of life wishes.  He and his daughter agree that do not resuscitate is in keeping with his beliefs.  . Dyslipidemia 11/01/2015   Last Assessment & Plan:  Formatting of this note might be different from the original. Cholesterol 136   Triglycerides  83  HDL  50.1  LDL Calculated  69  VLDL Cholesterol Cal  17   Continue statin.  Marland Kitchen Dyspnea    with activity  . Essential hypertension 11/01/2015   Last Assessment & Plan:  Resume home medications.  Follow-up as noted above.  Marland Kitchen GERD (gastroesophageal reflux disease)   . Herpes zoster 05/19/2019  . Myocardial infarction (HCC)    approx 1994  .  Neuropathy   . Other chest pain   . Recurrent falls 06/25/2019  . 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 Severe aortic stenosis   . Sleep apnea   . 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 Status post coronary artery bypass graft 11/01/2015   Last Assessment &  Plan:  Continue home regimen.    Past Surgical History:  Procedure Laterality Date  . CARDIAC CATHETERIZATION    . CARDIAC SURGERY    . CATARACT EXTRACTION, BILATERAL    . CORONARY ARTERY BYPASS GRAFT    . CORONARY STENT INTERVENTION N/A 10/07/2019   Procedure: CORONARY STENT INTERVENTION;  Surgeon: Tonny Bollman, MD;  Location: [facility] INVASIVE CV LAB;  Service: Cardiovascular;  Laterality: N/A;  . EYE SURGERY    . HEMORROIDECTOMY    . RIGHT/LEFT HEART CATH AND CORONARY/GRAFT ANGIOGRAPHY N/A 10/07/2019   Procedure: RIGHT/LEFT HEART CATH AND CORONARY/GRAFT ANGIOGRAPHY;  Surgeon: Tonny Bollman, MD;  Location: [facility] INVASIVE CV LAB;  Service: Cardiovascular;  Laterality: N/A;  . TEE WITHOUT CARDIOVERSION N/A 10/27/2019   Procedure: TRANSESOPHAGEAL ECHOCARDIOGRAM (TEE);  Surgeon: Kathleene Hazel, MD;  Location: [facility] INVASIVE CV LAB;  Service: Open Heart Surgery;  Laterality: N/A;  . TRANSCATHETER AORTIC VALVE REPLACEMENT, TRANSFEMORAL N/A 10/27/2019   Procedure: TRANSCATHETER AORTIC VALVE REPLACEMENT, TRANSFEMORAL;  Surgeon: Kathleene Hazel, MD;  Location: [facility] INVASIVE CV LAB;  Service: Open Heart Surgery;  Laterality: N/A;     reports that he quit smoking about 57 years ago. His smoking use included cigarettes and cigars. He has never used smokeless tobacco. He reports that he does not drink alcohol and does not use drugs.  Allergies  Allergen Reactions  . Poison Oak Extract Rash    Family History  Problem Relation Age of Onset  . Cancer Mother   . Diabetes Mother   . Kidney disease Mother   . Stroke Mother   . Sleep disorder Mother   . Arthritis Father   . Migraines Father   . Hypertension Father   . Sleep disorder Father   . Arthritis Brother   . Cancer Brother     Prior to Admission medications   Medication Sig Start Date End Date Taking? Authorizing Provider  acetaminophen (TYLENOL) 650 MG CR tablet Take 650 mg by mouth every 8 (eight) hours as needed for pain.      [provider]  amLODipine (NORVASC) 10 MG tablet Take 1 tablet (10 mg total) by mouth daily. 01/13/20   Jack Miyamoto, MD  aspirin EC 81 MG tablet Take 81 mg by mouth daily.    [provider]  clopidogrel (PLAVIX) 75 MG tablet TAKE ONE TABLET BY MOUTH ONCE DAILY WITH BREAKFAST 01/06/20   Georgeanna Lea, MD  enalapril (VASOTEC) 20 MG tablet TAKE 1 TABLET BY MOUTH  DAILY 02/10/20   Jack Miyamoto, MD  furosemide (LASIX) 40 MG tablet Take 1 tablet (40 mg total) by mouth daily. 02/16/20   Jack Miyamoto, MD  gabapentin (NEURONTIN) 300 MG capsule Take 1 capsule (300 mg total) by mouth 2 (two) times daily. 01/25/20   Jack Miyamoto, MD  meclizine (ANTIVERT) 25 MG tablet Take 1 tablet (25 mg total) by mouth 3 (three) times daily as needed for dizziness. 01/04/20   Jack Miyamoto, MD  nitroGLYCERIN (NITROSTAT) 0.4 MG SL tablet Place 1 tablet (0.4 mg total)  under the tongue every 5 (five) minutes as needed for chest pain. 10/08/19 10/07/20  Marjie Skiff E, PA-C  pantoprazole (PROTONIX) 40 MG tablet Take 1 tablet (40 mg total) by mouth daily. 01/13/20 04/12/20  Jack Miyamoto, MD  potassium chloride (KLOR-CON) 10 MEQ tablet Take 1 tablet (10 mEq total) by mouth daily. 01/13/20 04/12/20  Jack Miyamoto, MD  rosuvastatin (CRESTOR) 20 MG tablet TAKE 1 TABLET BY MOUTH AT  BEDTIME 12/15/19   Jack Miyamoto, MD  sertraline (ZOLOFT) 50 MG tablet Take 1 tablet (50 mg total) by mouth in the morning. 01/13/20   Jack Miyamoto, MD  trolamine salicylate (ASPERCREME) 10 % cream Apply 1 application topically daily as needed for muscle pain (Hands).     [provider]  vitamin B-12 (CYANOCOBALAMIN) 500 MCG tablet Take 500 mcg by mouth daily.    [provider]    Physical Exam: Vitals:   03/12/20 2013 03/12/20 2326 03/13/20 0157 03/13/20 0242  BP: 116/71 140/87 (!) 144/87 (!) 153/127  Pulse: 91 86 88 99   Resp: (!) 22 17 (!) 22 (!) 28  Temp: 98.3 F (36.8 C) 98.4 F (36.9 C) 98.3 F (36.8 C)   TempSrc: Oral Oral Oral   SpO2: 100% 97% 99% 98%  Weight:    74.8 kg  Height:    5\' 6"  (1.676 m)    Physical Exam Constitutional:      General: He is not in acute distress. HENT:     Head: Normocephalic and atraumatic.     Mouth/Throat:     Mouth: Mucous membranes are dry.     Pharynx: Oropharynx is clear.  Eyes:     Extraocular Movements: Extraocular movements intact.     Conjunctiva/sclera: Conjunctivae normal.  Cardiovascular:     Rate and Rhythm: Normal rate and regular rhythm.     Pulses: Normal pulses.  Pulmonary:     Effort: No respiratory distress.     Breath sounds: Normal breath sounds. No wheezing or rales.     Comments: Slightly tachypneic with respiratory rate in the mid 20s Abdominal:     General: Bowel sounds are normal.     Palpations: Abdomen is soft.     Tenderness: There is no abdominal tenderness. There is no guarding.  Musculoskeletal:        General: No swelling or tenderness.     Cervical back: Normal range of motion and neck supple.  Skin:    General: Skin is warm and dry.  Neurological:     General: No focal deficit present.     Mental Status: He is alert and oriented to person, place, and time.     Labs on Admission: I have personally reviewed following labs and imaging studies  CBC: Recent Labs  Lab 03/12/20 1723  WBC 8.5  HGB 12.3*  HCT 34.8*  MCV 84.1  PLT 89*   Basic Metabolic Panel: Recent Labs  Lab 03/12/20 1723  NA 135  K 3.7  CL 102  CO2 17*  GLUCOSE 136*  BUN 14  CREATININE 1.15  CALCIUM 9.1   GFR: Estimated Creatinine Clearance: 40.8 mL/min (by C-G formula based on SCr of 1.15 mg/dL). Liver Function Tests: No results for input(s): AST, ALT, ALKPHOS, BILITOT, PROT, ALBUMIN in the last 168 hours. No results for input(s): LIPASE, AMYLASE in the last 168 hours. No results for input(s): AMMONIA in the last 168  hours. Coagulation Profile: No results for input(s): INR, PROTIME in the last  168 hours. Cardiac Enzymes: No results for input(s): CKTOTAL, CKMB, CKMBINDEX, TROPONINI in the last 168 hours. BNP (last 3 results) No results for input(s): PROBNP in the last 8760 hours. HbA1C: No results for input(s): HGBA1C in the last 72 hours. CBG: No results for input(s): GLUCAP in the last 168 hours. Lipid Profile: No results for input(s): CHOL, HDL, LDLCALC, TRIG, CHOLHDL, LDLDIRECT in the last 72 hours. Thyroid Function Tests: No results for input(s): TSH, T4TOTAL, FREET4, T3FREE, THYROIDAB in the last 72 hours. Anemia Panel: No results for input(s): VITAMINB12, FOLATE, FERRITIN, TIBC, IRON, RETICCTPCT in the last 72 hours. Urine analysis:    Component Value Date/Time   COLORURINE AMBER (A) 03/12/2020 2156   APPEARANCEUR HAZY (A) 03/12/2020 2156   LABSPEC 1.025 03/12/2020 2156   PHURINE 5.0 03/12/2020 2156   GLUCOSEU 50 (A) 03/12/2020 2156   HGBUR NEGATIVE 03/12/2020 2156   BILIRUBINUR SMALL (A) 03/12/2020 2156   KETONESUR 20 (A) 03/12/2020 2156   PROTEINUR 100 (A) 03/12/2020 2156   NITRITE NEGATIVE 03/12/2020 2156   LEUKOCYTESUR NEGATIVE 03/12/2020 2156    Radiological Exams on Admission: DG Chest Portable 1 View  Result Date: 03/12/2020 CLINICAL DATA:  Weakness. The patient tested positive for COVID-19 yesterday. EXAM: PORTABLE CHEST 1 VIEW COMPARISON:  Single-view of the chest 10/27/2019. PA and lateral chest 10/23/2019. FINDINGS: There is retrocardiac airspace opacity obscuring the left hemidiaphragm. Right lung is clear. Heart size is normal. No pneumothorax or pleural effusion. IMPRESSION: Left basilar airspace disease worrisome for pneumonia. Electronically Signed   By: Drusilla Kanner M.D.   On: 03/12/2020 17:59    EKG: Independently reviewed.  Sinus or ectopic atrial rhythm, RBBB, QTc 519.  QT interval increase since prior tracing from August 2021.  Assessment/Plan Principal  Problem:   Pneumonia due to COVID-19 virus Active Problems:   Generalized weakness   Diarrhea   Metabolic acidosis   Thrombocytopenia (HCC)   COVID-19 viral pneumonia:  Patient tested positive for Covid 2 days ago (SARS-CoV-2 antigen test positive on 03/11/2020).  Slightly tachypneic but not hypoxic at rest.  Chest x-ray showing left basilar opacity concerning for pneumonia.  No leukocytosis to suggest bacterial pneumonia.  No fever, tachycardia, or signs of sepsis at present. -Start remdesivir -No indication for steroid as patient is not hypoxic -Vitamin C, zinc, vitamin D -Robitussin-DM as needed for cough -Tylenol as needed -Inflammatory markers including ferritin, fibrinogen, D-dimer, CRP, and LDH pending -Procalcitonin level pending -Lactic acid level pending -Airborne and contact precautions -Incentive spirometry, flutter valve -Encourage prone positioning -Continuous pulse ox -Supplemental oxygen as needed to keep oxygen saturation above 90% -Blood culture x2 pending  Generalized weakness: Attempt was made in the ED to ambulate the patient to check his pulse ox but patient was not even able to stand on his own without significant assistance.  -PT/OT eval, fall precautions.  Will likely need placement to SNF after discharge.  COVID-19 viral gastroenteritis: Patient reports having nausea, vomiting, and diarrhea.  No leukocytosis on labs.  Abdominal exam benign. -Symptomatic management: Gentle IV fluid hydration, Compazine as needed for nausea/vomiting, loperamide as needed for diarrhea  High anion gap metabolic acidosis: Likely due to diarrhea.  Bicarb 17, anion gap 16. -Gentle IV fluid hydration, continue to monitor metabolic panel  Abnormal urinalysis: UA not strongly suggestive of infection -negative nitrite, negative leukocytes, 6-10 WBCs, and few bacteria.  Patient is not endorsing UTI symptoms.  No leukocytosis or signs of sepsis. -Urine culture added on  Chronic  normocytic anemia: Hemoglobin  12.3, stable compared to prior labs. -Continue to monitor  Chronic thrombocytopenia: Platelet count 89K, stable compared to prior labs.  No signs of active bleeding. -Continue to monitor  CAD status post CABG: Stable.  Not endorsing any anginal symptoms at present. -Resume home meds after pharmacy med rec is done.  Severe aortic stenosis: Status post TAVR.  History of CVA Depression Hypertension: Stable Hyperlipidemia -Resume home meds after pharmacy med rec is done  QT prolongation EKG -Cardiac monitoring.  Keep potassium above 4 and magnesium above 2.  Avoid QT prolonging drugs and repeat EKG in a.m.  DVT prophylaxis: SCDs given thrombocytopenia Code Status: Patient wishes to be full code. Family Communication: No family available at this time. Disposition Plan: Status is: Observation  The patient remains OBS appropriate and will d/c before 2 midnights.  Dispo: The patient is from: Home              Anticipated d/c is to: SNF              Anticipated d/c date is: 2 days              Patient currently is not medically stable to d/c.  The medical decision making on this patient was of high complexity and the patient is at high risk for clinical deterioration, therefore this is a level 3 visit.  John Giovanni MD [facility]  If 7PM-7AM, please contact night-coverage [email or web address]  03/13/2020, 3:52 AM

## 2020-03-13 NOTE — ED Notes (Signed)
Ill for one to two weeks

## 2020-03-13 NOTE — ED Notes (Signed)
Lunch Tray Ordered @ 1032. 

## 2020-03-13 NOTE — ED Notes (Signed)
Pt very unsteady and weak on feet. Two NT had to assist pt into bed. Pt unable to support self walking with and without assistance.

## 2020-03-13 NOTE — Evaluation (Signed)
Physical Therapy Evaluation Patient Details Name: Jack Hodges MRN: [id number] DOB: 08/23/1930 Today's Date: 03/13/2020   History of Present Illness  86 y.o. male with medical history significant of CAD status post CABG, severe aortic stenosis status post TAVR, CVA, dementia, depression, hypertension, hyperlipidemia, GERD, history of recurrent falls presenting to the ED with a chief complaint of generalized weakness, testing positive for COVID 2 dayd prior to presenting to ED. Chest x-ray showing left basilar opacity concerning for PNA.  Clinical Impression  Pt presents to PT with deficits in strength, power, endurance, balance, and gait. Pt with chronic L drop foot and history of peripheral neuropathy and reports multiple past falls at baseline. Pt experiencing increased weakness and instability at this time with loss of balance when ambulating and multiple losses of balance when attempting to perform toileting tasks this session. Pt fatigues quickly at this time. Pt has reduced awareness of the impact his deficits have on his falls risk. Pt will benefit from acute PT services to improve strength and balance in the hope of reducing falls risk. PT recommends SNF placement at this time as the pt is home alone during the day and is likely to fall without assistance at this time.    Follow Up Recommendations SNF    Equipment Recommendations  3in1 (PT)    Recommendations for Other Services       Precautions / Restrictions Precautions Precautions: Fall Precaution Comments: COVID Restrictions Weight Bearing Restrictions: No      Mobility  Bed Mobility Overal bed mobility: Needs Assistance Bed Mobility: Supine to Sit;Sit to Supine     Supine to sit: Supervision Sit to supine: Min assist        Transfers Overall transfer level: Needs assistance Equipment used: 1 person hand held assist;Straight cane Transfers: Sit to/from [employer] Sit to Stand: Min  assist Stand pivot transfers: Min assist       General transfer comment: pt requries minA for transfers to steady due to increased sway  Ambulation/Gait Ambulation/Gait assistance: Min assist Gait Distance (Feet): 40 Feet Assistive device: Straight cane Gait Pattern/deviations: Step-to pattern Gait velocity: reduced Gait velocity interpretation: <1.8 ft/sec, indicate of risk for recurrent falls General Gait Details: pt with short step-to gait, one instance of loss of balance when left foot catches ground during swing phase. Slow and deliberate steps  Stairs            Wheelchair Mobility    Modified Rankin (Stroke Patients Only)       Balance Overall balance assessment: Needs assistance Sitting-balance support: Feet supported;No upper extremity supported Sitting balance-Leahy Scale: Fair     Standing balance support: Single extremity supported Standing balance-Leahy Scale: Poor Standing balance comment: reliant on UE support of cane of minA of PT                             Pertinent Vitals/Pain Pain Assessment: No/denies pain    Home Living Family/patient expects to be discharged to:: Private residence Living Arrangements: Children (son) Available Help at Discharge: Family;Available PRN/intermittently (son workls during the day) Type of Home: House Home Access: Stairs to enter Entrance Stairs-Rails: None Entrance Stairs-Number of Steps: 1 Home Layout: One level;Laundry or work area in [employer] Equipment: Environmental consultant - 2 wheels;Cane - single point      Prior Function Level of Independence: Independent with assistive device(s)         Comments: pt reports ambulating and  performing ADLs independently with use of cane, also reports multiple falls with the last being 2-3 months ago     Hand Dominance        Extremity/Trunk Assessment   Upper Extremity Assessment Upper Extremity Assessment: Generalized weakness;RUE deficits/detail;LUE  deficits/detail RUE Sensation: history of peripheral neuropathy LUE Sensation: history of peripheral neuropathy    Lower Extremity Assessment Lower Extremity Assessment: Generalized weakness;LLE deficits/detail LLE Deficits / Details: pt with chronic drop foot in LLE LLE Sensation: history of peripheral neuropathy    Cervical / Trunk Assessment Cervical / Trunk Assessment: Normal  Communication   Communication: HOH  Cognition Arousal/Alertness: Awake/alert Behavior During Therapy: WFL for tasks assessed/performed Overall Cognitive Status: Impaired/Different from baseline Area of Impairment: Awareness                           Awareness: Emergent   General Comments: pt with some reduced insight into how his weakness and fatiguability can increase his falls risk      General Comments General comments (skin integrity, edema, etc.): pt tachy into 110s during session, otherwise vitals [facility]    Exercises     Assessment/Plan    PT Assessment Patient needs continued PT services  PT Problem List Decreased strength;Decreased activity tolerance;Decreased balance;Decreased mobility;Decreased safety awareness;Impaired sensation       PT Treatment Interventions DME instruction;Gait training;Stair training;Functional mobility training;Therapeutic activities;Balance training;Therapeutic exercise;Neuromuscular re-education;Patient/family education    PT Goals (Current goals can be found in the Care Plan section)  Acute Rehab PT Goals Patient Stated Goal: To return to baseline PT Goal Formulation: With patient Time For Goal Achievement: 03/27/20 Potential to Achieve Goals: Good Additional Goals Additional Goal #1: Pt will maintain dynamic standing balance without UE support while performing ADLs at a modI level    Frequency Min 3X/week (3x/week, could progress quickly)   Barriers to discharge Decreased caregiver support      Co-evaluation               AM-PAC  PT "6 Clicks" Mobility  Outcome Measure Help needed turning from your back to your side while in a flat bed without using bedrails?: A Little Help needed moving from lying on your back to sitting on the side of a flat bed without using bedrails?: A Little Help needed moving to and from a bed to a chair (including a wheelchair)?: A Little Help needed standing up from a chair using your arms (e.g., wheelchair or bedside chair)?: A Little Help needed to walk in hospital room?: A Little Help needed climbing 3-5 steps with a railing? : A Lot 6 Click Score: 17    End of Session   Activity Tolerance: Patient tolerated treatment well Patient left: in bed;with call bell/phone within reach Nurse Communication: Mobility status PT Visit Diagnosis: Unsteadiness on feet (R26.81);Muscle weakness (generalized) (M62.81);History of falling (Z91.81)    Time: 4536-4680 PT Time Calculation (min) (ACUTE ONLY): 33 min   Charges:   PT Evaluation $PT Eval Low Complexity: 1 Low PT Treatments $Therapeutic Activity: 8-22 mins        Arlyss Gandy, PT, DPT Acute Rehabilitation Pager: [phone]   Arlyss Gandy 03/13/2020, 10:44 AM

## 2020-03-13 NOTE — Progress Notes (Signed)
PROGRESS NOTE    Jack Hodges  RXV:[id number] DOB: Sep 26, 1930 DOA: 03/12/2020 PCP: Jack Miyamoto, MD    Brief Narrative:  Jack Hodges was admitted to the hospital with a working diagnosis of SARS COVID-19 viral pneumonia.  86 year old male with past medical history for coronary artery disease status post bypass grafting, severe aortic stenosis status post TAVR, history of CVA, hypertension, dyslipidemia, dementia and depression.  Patient tested for SARS COVID-19 03/11/2020, his symptoms were consistent with fevers, generalized weakness, cough, nausea, vomiting, diarrhea, poor oral intake, and dyspnea.  He has not been vaccinated for COVID-19.  Due to persistent and worsening symptoms he presented to the hospital.  On his initial physical examination he was tachypneic 28 breaths/min, heart rate 99 bpm, blood pressure 153/87, temperature 98.3, oxygen saturation 98%, he had dry mucous membranes, his lungs had no wheezing, heart S1-S2 present, rhythmic, abdomen soft, no lower extremity edema.  Sodium 135, potassium 3.7, chloride 102, bicarb 17, BUN 14, creatinine 1.15, magnesium 1.8, white count 8.5, hemoglobin 12.3, hematocrit 34.8, platelets 89.  Urinalysis specific gravity 1.025, 100 protein, 6-10 white cells. Chest radiograph had right base atelectasis and left retrocardiac infiltrate.  EKG 81 bpm, rightward axis, right bundle branch block, QTc 505, sinus rhythm, no significant ST segment, V1 through V4 T wave versions.    Assessment & Plan:   Principal Problem:   Pneumonia due to COVID-19 virus Active Problems:   Generalized weakness   Diarrhea   Metabolic acidosis   Thrombocytopenia (HCC)   1. SARS COVID-19 viral pneumonia/left lower lobe.   RR: 30  Pulse oxymetry: 95 Fi02: RA  COVID-19 Labs  Recent Labs    03/13/20 0505  DDIMER 2.17*  FERRITIN 651*  LDH 391*  CRP 6.1*    Lab Results  Component Value Date   SARSCOV2NAA NEGATIVE 10/23/2019    Patient with  tachypnea and elevated inflammatory markers, high risk for worsening COVID 19 pneumonia and impending respiratory failure.  Add systemic corticosteroids with methylprednisolone 80 mg IV q12 hrs, continue Remdesivir #1/5.  Antitussive agents, bronchodilators and airway clearing techniques with flutter valve and incentive spirometer.  If worsening inflammatory markers will consider adding barcitinib.   Diarrhea illness likely related to acute COVID 19, as needed loperamide.   2. AKI with acute anion gap metabolic acidosis/ hypokalemia. Follow up on renal function today. In the setting of acute viral pneumonia will follow a restrictive IV fluids strategy and will hold on IV fluids for now.   3. CAD sp CABG/ AS sp TAVR. Hypotension with blood pressure 99/58 and 127/65 mmHg. Continue close monitoring.   3. Chronic anemia and thrombocytopenia. Close follow up on cell count, currently no indication for transfusion.   Patient continue to be at high risk for worsening pneumonia and impending respiratory failure.   Status is: Observation  The patient will require care spanning > 2 midnights and should be moved to inpatient because: IV treatments appropriate due to intensity of illness or inability to take PO  Dispo: The patient is from: Home              Anticipated d/c is to: SNF              Anticipated d/c date is: > 3 days              Patient currently is not medically stable to d/c.   DVT prophylaxis: Enoxaparin   Code Status:   full  Family Communication:  I spoke over  the phone with the patient's son about patient's  condition, plan of care, prognosis and all questions were addressed.     Subjective: Patient continue to be very weak and deconditioned, positive productive cough, no nausea or vomiting, no chest pain, positive loss stools.   Objective: Vitals:   03/13/20 0530 03/13/20 0545 03/13/20 0552 03/13/20 0730  BP: 119/61 123/61  (!) 99/58  Pulse: 74 79  70  Resp: (!) 29 (!)  28  (!) 30  Temp:   98.1 F (36.7 C)   TempSrc:      SpO2: 97% 98%  95%  Weight:      Height:       No intake or output data in the 24 hours ending 03/13/20 0825 Filed Weights   03/13/20 0242  Weight: 74.8 kg    Examination:   General: deconditioned, positive dyspnea,  Neurology: Awake and alert, non focal  E ENT: no pallor, no icterus, oral mucosa moist Cardiovascular: No JVD. S1-S2 present, rhythmic, no gallops, rubs, or murmurs. No lower extremity edema. Pulmonary: vesicular breath sounds bilaterally, with no wheezing, Gastrointestinal. Abdomen soft and non tender Skin. No rashes Musculoskeletal: no joint deformities     Data Reviewed: I have personally reviewed following labs and imaging studies  CBC: Recent Labs  Lab 03/12/20 1723  WBC 8.5  HGB 12.3*  HCT 34.8*  MCV 84.1  PLT 89*   Basic Metabolic Panel: Recent Labs  Lab 03/12/20 1723 03/13/20 0505  NA 135  --   K 3.7  --   CL 102  --   CO2 17*  --   GLUCOSE 136*  --   BUN 14  --   CREATININE 1.15  --   CALCIUM 9.1  --   MG  --  1.8   GFR: Estimated Creatinine Clearance: 40.8 mL/min (by C-G formula based on SCr of 1.15 mg/dL). Liver Function Tests: Recent Labs  Lab 03/13/20 0505  AST 49*  ALT 27  ALKPHOS 56  BILITOT 1.8*  PROT 6.2*  ALBUMIN 3.3*   No results for input(s): LIPASE, AMYLASE in the last 168 hours. No results for input(s): AMMONIA in the last 168 hours. Coagulation Profile: No results for input(s): INR, PROTIME in the last 168 hours. Cardiac Enzymes: No results for input(s): CKTOTAL, CKMB, CKMBINDEX, TROPONINI in the last 168 hours. BNP (last 3 results) No results for input(s): PROBNP in the last 8760 hours. HbA1C: No results for input(s): HGBA1C in the last 72 hours. CBG: No results for input(s): GLUCAP in the last 168 hours. Lipid Profile: Recent Labs    03/13/20 0506  TRIG 106   Thyroid Function Tests: No results for input(s): TSH, T4TOTAL, FREET4, T3FREE,  THYROIDAB in the last 72 hours. Anemia Panel: Recent Labs    03/13/20 0505  FERRITIN 651*      Radiology Studies: I have reviewed all of the imaging during this hospital visit personally     Scheduled Meds: . vitamin C  500 mg Oral Daily  . cholecalciferol  1,000 Units Oral Daily  . zinc sulfate  220 mg Oral Daily   Continuous Infusions: . sodium chloride 75 mL/hr at 03/13/20 0532  . [START ON 03/14/2020] remdesivir 100 mg in NS 100 mL       LOS: 0 days        Sheddrick Lattanzio Annett Gula, MD

## 2020-03-13 NOTE — ED Notes (Signed)
Pt provided with breakfast tray. Pt had large watery episode of diarrhea on the bedside commode.

## 2020-03-13 NOTE — ED Notes (Signed)
Tele  Breakfast Ordered 

## 2020-03-13 NOTE — ED Provider Notes (Addendum)
TIME SEEN: 3:02 AM  CHIEF COMPLAINT: Generalized weakness, vomiting and diarrhea, Covid positive  HPI: Patient is an 86 year old male with history of dementia, CVA, hypertension, dyslipidemia, aortic valve replacement due to aortic stenosis, CAD status post CABG who presents to the emergency department with complaints of generalized weakness for the past week and a half.  He reports he has been coughing.  Was diagnosed with COVID-19 by his PCP on Friday, December 17.  Denies chest pain or shortness of breath.  Has had fevers, headache, nasal congestion and body aches.  He is not vaccinated for COVID-19.  Reports he has had numerous episodes of vomiting and diarrhea.  States he is so weak that he is unable to walk with his cane which is his baseline.  Lives at home with his son.  It appears he was hypoxic to 89% per his PCP note.  He sees Dr. Marina Goodell in [place].  ROS: See HPI Constitutional:  fever  Eyes: no drainage  ENT: no runny nose   Cardiovascular:  no chest pain  Resp: no SOB  GI: Vomiting and diarrhea GU: no dysuria Integumentary: no rash  Allergy: no hives  Musculoskeletal: no leg swelling  Neurological: no slurred speech ROS otherwise negative  PAST MEDICAL HISTORY/PAST SURGICAL HISTORY:  Past Medical History:  Diagnosis Date  . Arthritis   . Bilateral foot-drop 01/18/2017  . Cellulitis of left hand 07/07/2019  . Coronary artery disease involving native coronary artery of native heart without angina pectoris 11/01/2015   Overview:  Bypass surgery in 1996 Overview:  Overview:  Bypass surgery in 1996  Last Assessment & Plan:  Continue his current regimen.  Follow up as noted.  . CVA (cerebral vascular accident) (HCC) 04/03/2016   Last Assessment & Plan:  The patient underwent extensive CVA workup.  MRI is negative.  He does have aortic stenosis by echocardiogram.  Upon review of Care everywhere he is followed by Dr. Bing Matter, cardiologist for this.  Carotid ultrasound negative for  hemodynamically significant stenosis.  Lipid panel within normal limits.  He has not had any further episodes of dizziness or nausea vomiting.   . Dementia (HCC)   . Depression   . Dizziness 04/19/2016  . DNR (do not resuscitate) 04/03/2016   Overview:  I had an extensive discussion with the patient on 04/03/2016 regarding his end of life wishes.  He and his daughter agree that do not resuscitate is in keeping with his beliefs.  . Dyslipidemia 11/01/2015   Last Assessment & Plan:  Formatting of this note might be different from the original. Cholesterol 136   Triglycerides  83  HDL  50.1  LDL Calculated  69  VLDL Cholesterol Cal  17   Continue statin.  Marland Kitchen Dyspnea    with activity  . Essential hypertension 11/01/2015   Last Assessment & Plan:  Resume home medications.  Follow-up as noted above.  Marland Kitchen GERD (gastroesophageal reflux disease)   . Herpes zoster 05/19/2019  . Myocardial infarction (HCC)    approx 1994  . Neuropathy   . Other chest pain   . Recurrent falls 06/25/2019  . 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 Severe aortic stenosis   . Sleep apnea   . 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  MEDICATIONS:  Prior to Admission medications   Medication Sig Start Date End Date Taking? Authorizing Provider  acetaminophen (TYLENOL) 650 MG CR tablet Take 650 mg by mouth every 8 (eight) hours as needed for pain.     [provider]  amLODipine (NORVASC) 10 MG tablet Take 1 tablet (10 mg total) by mouth daily. 01/13/20   Abigail Miyamoto, MD  aspirin EC 81 MG tablet Take 81 mg by mouth daily.    [provider]  clopidogrel (PLAVIX) 75 MG tablet TAKE ONE TABLET BY MOUTH ONCE DAILY WITH BREAKFAST 01/06/20    Georgeanna Lea, MD  enalapril (VASOTEC) 20 MG tablet TAKE 1 TABLET BY MOUTH  DAILY 02/10/20   Abigail Miyamoto, MD  furosemide (LASIX) 40 MG tablet Take 1 tablet (40 mg total) by mouth daily. 02/16/20   Abigail Miyamoto, MD  gabapentin (NEURONTIN) 300 MG capsule Take 1 capsule (300 mg total) by mouth 2 (two) times daily. 01/25/20   Abigail Miyamoto, MD  meclizine (ANTIVERT) 25 MG tablet Take 1 tablet (25 mg total) by mouth 3 (three) times daily as needed for dizziness. 01/04/20   Abigail Miyamoto, MD  nitroGLYCERIN (NITROSTAT) 0.4 MG SL tablet Place 1 tablet (0.4 mg total) under the tongue every 5 (five) minutes as needed for chest pain. 10/08/19 10/07/20  Marjie Skiff E, PA-C  pantoprazole (PROTONIX) 40 MG tablet Take 1 tablet (40 mg total) by mouth daily. 01/13/20 04/12/20  Abigail Miyamoto, MD  potassium chloride (KLOR-CON) 10 MEQ tablet Take 1 tablet (10 mEq total) by mouth daily. 01/13/20 04/12/20  Abigail Miyamoto, MD  rosuvastatin (CRESTOR) 20 MG tablet TAKE 1 TABLET BY MOUTH AT  BEDTIME 12/15/19   Abigail Miyamoto, MD  sertraline (ZOLOFT) 50 MG tablet Take 1 tablet (50 mg total) by mouth in the morning. 01/13/20   Abigail Miyamoto, MD  trolamine salicylate (ASPERCREME) 10 % cream Apply 1 application topically daily as needed for muscle pain (Hands).     [provider]  vitamin B-12 (CYANOCOBALAMIN) 500 MCG tablet Take 500 mcg by mouth daily.    [provider]    ALLERGIES:  Allergies  Allergen Reactions  . Poison Oak Extract Rash    SOCIAL HISTORY:  Social History   Tobacco Use  . Smoking status: Former Smoker    Types: Cigarettes, Cigars    Quit date: 05/02/1962    Years since quitting: 57.9  . Smokeless tobacco: Never Used  Substance Use Topics  . Alcohol use: No    FAMILY HISTORY: Family History  Problem Relation Age of Onset  . Cancer Mother   . Diabetes Mother   . Kidney disease Mother   . Stroke  Mother   . Sleep disorder Mother   . Arthritis Father   . Migraines Father   . Hypertension Father   . Sleep disorder Father   . Arthritis Brother   . Cancer Brother     EXAM: BP (!) 153/127 (BP Location: Right Arm)   Pulse 99   Temp 98.3 F (36.8 C) (Oral)   Resp (!) 28   Ht 5\' 6"  (1.676 m)   Wt 74.8 kg   SpO2 98%   BMI 26.63 kg/m  CONSTITUTIONAL: Alert and oriented and responds appropriately to questions.  Elderly, afebrile, nontoxic-appearing HEAD: Normocephalic EYES: Conjunctivae clear, pupils appear equal, EOM appear intact ENT: normal nose; dry mucous membranes NECK: Supple, normal ROM CARD: RRR; S1 and S2 appreciated; no murmurs,  no clicks, no rubs, no gallops RESP: Intermittently tachypneic, breath sounds clear and equal bilaterally; no wheezes, no rhonchi, no rales, no hypoxia or respiratory distress, speaking full sentences ABD/GI: Normal bowel sounds; non-distended; soft, non-tender, no rebound, no guarding, no peritoneal signs, no hepatosplenomegaly BACK:  The back appears normal EXT: Normal ROM in all joints; no deformity noted, no edema; no cyanosis, no calf tenderness or calf swelling SKIN: Normal color for age and race; warm; no rash on exposed skin NEURO: Moves all extremities equally, normal speech PSYCH: The patient's mood and manner are appropriate.   MEDICAL DECISION MAKING: Patient here with generalized weakness.  Diagnosed with COVID-19 2 days ago.  Has had a week and a half of symptoms including headaches, congestion, cough, body aches, fever, vomiting, diarrhea.  Labs show a metabolic acidosis likely from vomiting and diarrhea.  Will gently hydrate patient but be very cautious with this given he has Covid.  Chest x-ray is concerning for Covid pneumonia.  He was documented as mildly hypoxic by his PCP.  He is not hypoxic on room air currently denies shortness of breath although is intermittently tachypneic here.  Attempted to ambulate patient in the room to  obtain pulse oximetry with ambulation but patient was not even able to stand on his own without significant assistance which is different from his baseline.  I feel he will need admission and likely PT/OT.  Patient comfortable with this plan.  ED PROGRESS: 3:19 AM Discussed patient's case with hospitalist, Dr. Loney Loh.  I have recommended admission and patient (and family if present) agree with this plan. Admitting physician will place admission orders.   I reviewed all nursing notes, vitals, pertinent previous records and reviewed/interpreted all EKGs, lab and urine results, imaging (as available).      EKG Interpretation  Date/Time:  Saturday March 12 2020 17:06:43 EST Ventricular Rate:  81 PR Interval:    QRS Duration: 136 QT Interval:  466 QTC Calculation: 541 R Axis:   106 Text Interpretation: Undetermined rhythm due to artifact Right bundle branch block T wave abnormality, consider lateral ischemia Abnormal ECG Confirmed by Rochele Raring [phone]) on 03/13/2020 2:30:15 AM        EKG Interpretation  Date/Time:  Sunday March 13 2020 02:43:47 EST Ventricular Rate:  87 PR Interval:    QRS Duration: 144 QT Interval:  431 QTC Calculation: 519 R Axis:   101 Text Interpretation: Sinus or ectopic atrial rhythm Right bundle branch block No significant change since last tracing Confirmed by Rochele Raring [phone]) on 03/13/2020 3:26:04 AM        Maxwell W Chilton Si was evaluated in Emergency Department on 03/13/2020 for the symptoms described in the history of present illness. He was evaluated in the context of the global COVID-19 pandemic, which necessitated consideration that the patient might be at risk for infection with the SARS-CoV-2 virus that causes COVID-19. Institutional protocols and algorithms that pertain to the evaluation of patients at risk for COVID-19 are in a state of rapid change based on information released by regulatory bodies including the CDC and federal and state  organizations. These policies and algorithms were followed during the patient's care in the ED.      Meron Bocchino, Layla Maw, DO 03/13/20 0320    Ilani Otterson, Layla Maw, DO 03/13/20 [phone]

## 2020-03-13 NOTE — ED Notes (Signed)
Pt able to keep food/fluids down. No episodes of vomiting or diarrhea since 1200 today.

## 2020-03-14 LAB — BASIC METABOLIC PANEL
Anion gap: 12 (ref 5–15)
BUN: 17 mg/dL (ref 8–23)
CO2: 21 mmol/L — ABNORMAL LOW (ref 22–32)
Calcium: 9.1 mg/dL (ref 8.9–10.3)
Chloride: 108 mmol/L (ref 98–111)
Creatinine, Ser: 0.79 mg/dL (ref 0.61–1.24)
GFR, Estimated: >60 mL/min (ref >60)
Glucose, Bld: 166 mg/dL — ABNORMAL HIGH (ref 70–99)
Potassium: 3.9 mmol/L (ref 3.5–5.1)
Sodium: 141 mmol/L (ref 135–145)

## 2020-03-14 LAB — C-REACTIVE PROTEIN: CRP: 7.7 mg/dL — ABNORMAL HIGH (ref <1.0)

## 2020-03-14 LAB — FERRITIN: Ferritin: 690 ng/mL — ABNORMAL HIGH (ref 24–336)

## 2020-03-14 LAB — D-DIMER, QUANTITATIVE: D-Dimer, Quant: 1.75 ug/mL-FEU — ABNORMAL HIGH (ref 0.00–0.50)

## 2020-03-14 MED ORDER — ENSURE ENLIVE PO LIQD
237.0000 mL | Freq: Two times a day (BID) | ORAL | Status: DC
  Administered 2020-03-14: 17:00:00 237 mL via ORAL

## 2020-03-14 NOTE — ED Notes (Signed)
RN helped pt with Lunch. Pt sitting on side of bed eating lunch. No concerns at this time.

## 2020-03-14 NOTE — Progress Notes (Addendum)
PROGRESS NOTE    Jack Hodges  NFA:[id number] DOB: 12/04/1930 DOA: 03/12/2020 PCP: Abigail Miyamoto, MD    Brief Narrative:  Mr. Bufano was admitted to the hospital with a working diagnosis of SARS COVID-19 viral pneumonia.  86 year old male with past medical history for coronary artery disease status post bypass grafting, severe aortic stenosis status post TAVR, history of CVA, hypertension, dyslipidemia, dementia and depression.  Patient tested for SARS COVID-19 03/11/2020, his symptoms were consistent with fevers, generalized weakness, cough, nausea, vomiting, diarrhea, poor oral intake, and dyspnea.  He has not been vaccinated for COVID-19.  Due to persistent and worsening symptoms he presented to the hospital.  On his initial physical examination he was tachypneic 28 breaths/min, heart rate 99 bpm, blood pressure 153/87, temperature 98.3, oxygen saturation 98%, he had dry mucous membranes, his lungs had no wheezing, heart S1-S2 present, rhythmic, abdomen soft, no lower extremity edema.  Sodium 135, potassium 3.7, chloride 102, bicarb 17, BUN 14, creatinine 1.15, magnesium 1.8, white count 8.5, hemoglobin 12.3, hematocrit 34.8, platelets 89.  Urinalysis specific gravity 1.025, 100 protein, 6-10 white cells. Chest radiograph had right base atelectasis and left retrocardiac infiltrate.  EKG 81 bpm, rightward axis, right bundle branch block, QTc 505, sinus rhythm, no significant ST segment, V1 through V4 T wave versions.   Assessment & Plan:   Principal Problem:   Pneumonia due to COVID-19 virus Active Problems:   Generalized weakness   Diarrhea   Metabolic acidosis   Thrombocytopenia (HCC)    1. SARS COVID-19 viral pneumonia/left lower lobe.   RR: 21  Pulse oxymetry: 98%  Fi02: 21% room air.   COVID-19 Labs  Recent Labs    03/13/20 0505 03/14/20 0504  DDIMER 2.17* 1.75*  FERRITIN 651* 690*  LDH 391*  --   CRP 6.1* 7.7*    Lab Results  Component Value Date    SARSCOV2NAA NEGATIVE 10/23/2019    Stable inflammatory markers, but work of breathing has improved.   Continue with systemic corticosteroids with methylprednisolone 80 mg IV q12 hrs, and antiviral therapy with Remdesivir #2/5.  On antitussive agents, bronchodilators and airway clearing techniques with flutter valve and incentive spirometer.   Diarrhea illness likely related to acute COVID 19, improved with as needed loperamide.  Follow up with PT/OT, encourage to be out of bed to chair tid with meals.   2. AKI with acute anion gap metabolic acidosis/ hypokalemia. Stable renal function with serum cr at 0,79 with K at 3,9 and bicarbonate at 21. Resolved anion gap metabolic acidosis.   Continue to follow up on renal function in am  3. CAD sp CABG/ AS sp TAVR. Blood pressure more stable 137/75 mmHg today, continue close monitoring.   3. Chronic anemia and reactive leukocytosis and thrombocytopenia. Follow cell count in am. Risk vs benefit continue prophylactic DVT with enoxaparin    Status is: Inpatient  Remains inpatient appropriate because:IV treatments appropriate due to intensity of illness or inability to take PO   Dispo: The patient is from: Home              Anticipated d/c is to: Home              Anticipated d/c date is: 3 days              Patient currently is not medically stable to d/c.   DVT prophylaxis: Enoxaparin   Code Status:   full  Family Communication:  I spoke with his son yesterday  over the phone      Subjective: Patient is feeling better, but not yet close to baseline, no nausea or vomiting, dyspnea has improved along with diarrhea,.   Objective: Vitals:   03/14/20 0902 03/14/20 1000 03/14/20 1204 03/14/20 1205  BP:  (!) 142/75 136/73 136/73  Pulse: 85 88 94 91  Resp: (!) 21 19  (!) 21  Temp:      TempSrc:      SpO2: 97% 97% 96% 98%  Weight:      Height:        Intake/Output Summary (Last 24 hours) at 03/14/2020 1212 Last data filed  at 03/14/2020 1118 Gross per 24 hour  Intake 100 ml  Output --  Net 100 ml   Filed Weights   03/13/20 0242  Weight: 74.8 kg    Examination:   General: Not in pain or dyspnea, deconditioned  Neurology: Awake and alert, non focal  E ENT: mild pallor, no icterus, oral mucosa moist Cardiovascular: No JVD. S1-S2 present, rhythmic, no gallops, rubs, or murmurs. No lower extremity edema. Pulmonary: positive breath sounds bilaterally, with no wheezing, rhonchi or rales. Gastrointestinal. Abdomen soft and non tender Skin. No rashes Musculoskeletal: no joint deformities     Data Reviewed: I have personally reviewed following labs and imaging studies  CBC: Recent Labs  Lab 03/12/20 1723 03/13/20 1236  WBC 8.5 11.6*  HGB 12.3* 11.8*  HCT 34.8* 35.2*  MCV 84.1 85.0  PLT 89* 99*   Basic Metabolic Panel: Recent Labs  Lab 03/12/20 1723 03/13/20 0505 03/13/20 1139 03/14/20 0504  NA 135  --  139 141  K 3.7  --  3.2* 3.9  CL 102  --  107 108  CO2 17*  --  17* 21*  GLUCOSE 136*  --  120* 166*  BUN 14  --  16 17  CREATININE 1.15  --  0.87 0.79  CALCIUM 9.1  --  8.7* 9.1  MG  --  1.8  --   --    GFR: Estimated Creatinine Clearance: 58.7 mL/min (by C-G formula based on SCr of 0.79 mg/dL). Liver Function Tests: Recent Labs  Lab 03/13/20 0505 03/13/20 1139  AST 49* 53*  ALT 27 26  ALKPHOS 56 64  BILITOT 1.8* 1.1  PROT 6.2* 6.1*  ALBUMIN 3.3* 3.3*   No results for input(s): LIPASE, AMYLASE in the last 168 hours. No results for input(s): AMMONIA in the last 168 hours. Coagulation Profile: No results for input(s): INR, PROTIME in the last 168 hours. Cardiac Enzymes: No results for input(s): CKTOTAL, CKMB, CKMBINDEX, TROPONINI in the last 168 hours. BNP (last 3 results) No results for input(s): PROBNP in the last 8760 hours. HbA1C: No results for input(s): HGBA1C in the last 72 hours. CBG: No results for input(s): GLUCAP in the last 168 hours. Lipid  Profile: Recent Labs    03/13/20 0506  TRIG 106   Thyroid Function Tests: No results for input(s): TSH, T4TOTAL, FREET4, T3FREE, THYROIDAB in the last 72 hours. Anemia Panel: Recent Labs    03/13/20 0505 03/14/20 0504  FERRITIN 651* 690*      Radiology Studies: I have reviewed all of the imaging during this hospital visit personally     Scheduled Meds: . vitamin C  500 mg Oral Daily  . chlorpheniramine-HYDROcodone  5 mL Oral Q12H  . cholecalciferol  1,000 Units Oral Daily  . enoxaparin (LOVENOX) injection  40 mg Subcutaneous Q24H  . Ipratropium-Albuterol  1 puff Inhalation QID  .  methylPREDNISolone (SOLU-MEDROL) injection  80 mg Intravenous Q12H  . zinc sulfate  220 mg Oral Daily   Continuous Infusions: . remdesivir 100 mg in NS 100 mL Stopped (03/14/20 1118)     LOS: 1 day        Johnwesley Lederman Annett Gula, MD

## 2020-03-14 NOTE — ED Notes (Signed)
Given food tray.

## 2020-03-14 NOTE — ED Notes (Signed)
Son updated on pts care by RN with permission from Pt.

## 2020-03-14 NOTE — Progress Notes (Signed)
Jack Hodges [id number]  Code Status: FULL Jack Hodges is a 86 y.o. male patient admitted from ED awake, alert - oriented X 4 - no acute distress noted. VSS - Blood pressure 137/75, pulse 72, temperature 97.7 F (36.5 C), temperature source Oral, resp. rate 18, height 5\' 6"  (1.676 m), weight 74.8 kg, SpO2 94 %. no c/o shortness of breath, no c/o chest pain. Orientation to room, and floor completed with information packet given to patient. Admission INP armband ID verified with patient, and in place.  Fall assessment complete, with patient and family able to verbalize understanding of risk associated with falls, and verbalized understanding to call nsg before up out of bed. Call light within reach, patient able to voice, and demonstrate understanding. Skin, clean-dry- intact, some bruising on arms. No evidence of skin break down noted on exam.  ?  Will cont to eval and treat per MD orders.  , RN  03/14/2020

## 2020-03-15 ENCOUNTER — Other Ambulatory Visit: Payer: Self-pay | Admitting: Legal Medicine

## 2020-03-15 DIAGNOSIS — A09 Infectious gastroenteritis and colitis, unspecified: Secondary | ICD-10-CM

## 2020-03-15 DIAGNOSIS — A0839 Other viral enteritis: Secondary | ICD-10-CM

## 2020-03-15 DIAGNOSIS — K2100 Gastro-esophageal reflux disease with esophagitis, without bleeding: Secondary | ICD-10-CM

## 2020-03-15 DIAGNOSIS — I10 Essential (primary) hypertension: Secondary | ICD-10-CM

## 2020-03-15 LAB — D-DIMER, QUANTITATIVE: D-Dimer, Quant: 0.97 ug/mL-FEU — ABNORMAL HIGH (ref 0.00–0.50)

## 2020-03-15 LAB — CBC WITH DIFFERENTIAL/PLATELET
Abs Immature Granulocytes: 0.08 10*3/uL — ABNORMAL HIGH (ref 0.00–0.07)
Basophils Absolute: 0 10*3/uL (ref 0.0–0.1)
Basophils Relative: 0 %
Eosinophils Absolute: 0 10*3/uL (ref 0.0–0.5)
Eosinophils Relative: 0 %
HCT: 31.4 % — ABNORMAL LOW (ref 39.0–52.0)
Hemoglobin: 11.1 g/dL — ABNORMAL LOW (ref 13.0–17.0)
Immature Granulocytes: 1 %
Lymphocytes Relative: 8 %
Lymphs Abs: 0.8 10*3/uL (ref 0.7–4.0)
MCH: 29.8 pg (ref 26.0–34.0)
MCHC: 35.4 g/dL (ref 30.0–36.0)
MCV: 84.2 fL (ref 80.0–100.0)
Monocytes Absolute: 0.3 10*3/uL (ref 0.1–1.0)
Monocytes Relative: 3 %
Neutro Abs: 8.9 10*3/uL — ABNORMAL HIGH (ref 1.7–7.7)
Neutrophils Relative %: 88 %
Platelets: 130 10*3/uL — ABNORMAL LOW (ref 150–400)
RBC: 3.73 MIL/uL — ABNORMAL LOW (ref 4.22–5.81)
RDW: 15.7 % — ABNORMAL HIGH (ref 11.5–15.5)
WBC: 10.1 10*3/uL (ref 4.0–10.5)
nRBC: 0.3 % — ABNORMAL HIGH (ref 0.0–0.2)

## 2020-03-15 LAB — COMPREHENSIVE METABOLIC PANEL
ALT: 25 U/L (ref 0–44)
AST: 39 U/L (ref 15–41)
Albumin: 3 g/dL — ABNORMAL LOW (ref 3.5–5.0)
Alkaline Phosphatase: 57 U/L (ref 38–126)
Anion gap: 10 (ref 5–15)
BUN: 23 mg/dL (ref 8–23)
CO2: 19 mmol/L — ABNORMAL LOW (ref 22–32)
Calcium: 9 mg/dL (ref 8.9–10.3)
Chloride: 109 mmol/L (ref 98–111)
Creatinine, Ser: 0.89 mg/dL (ref 0.61–1.24)
GFR, Estimated: >60 mL/min (ref >60)
Glucose, Bld: 178 mg/dL — ABNORMAL HIGH (ref 70–99)
Potassium: 3.7 mmol/L (ref 3.5–5.1)
Sodium: 138 mmol/L (ref 135–145)
Total Bilirubin: 0.5 mg/dL (ref 0.3–1.2)
Total Protein: 5.7 g/dL — ABNORMAL LOW (ref 6.5–8.1)

## 2020-03-15 LAB — FERRITIN: Ferritin: 674 ng/mL — ABNORMAL HIGH (ref 24–336)

## 2020-03-15 LAB — C-REACTIVE PROTEIN: CRP: 3.4 mg/dL — ABNORMAL HIGH (ref <1.0)

## 2020-03-15 MED ORDER — POLYVINYL ALCOHOL 1.4 % OP SOLN
1.0000 [drp] | OPHTHALMIC | Status: DC | PRN
  Administered 2020-03-15 (×2): 1 [drp] via OPHTHALMIC
  Filled 2020-03-15: qty 15

## 2020-03-15 MED ORDER — ENSURE ENLIVE PO LIQD
237.0000 mL | Freq: Three times a day (TID) | ORAL | Status: DC
  Administered 2020-03-15: 16:00:00 237 mL via ORAL
  Filled 2020-03-15: qty 237

## 2020-03-15 MED ORDER — SODIUM CHLORIDE 0.9 % IV SOLN: INTRAVENOUS | Status: DC | PRN

## 2020-03-15 MED ORDER — ADULT MULTIVITAMIN W/MINERALS CH
1.0000 | ORAL_TABLET | Freq: Every day | ORAL | Status: DC
  Administered 2020-03-15 – 2020-03-17 (×3): 1 via ORAL
  Filled 2020-03-15 (×3): qty 1

## 2020-03-15 MED ORDER — LISINOPRIL 5 MG PO TABS
2.5000 mg | ORAL_TABLET | Freq: Every day | ORAL | Status: DC
Start: 2020-03-15 — End: 2020-03-17
  Administered 2020-03-15 – 2020-03-16 (×2): 2.5 mg via ORAL
  Filled 2020-03-15 (×2): qty 1

## 2020-03-15 NOTE — Progress Notes (Signed)
Initial Nutrition Assessment  DOCUMENTATION CODES:   Not applicable  INTERVENTION:   -Increase Ensure Enlive po to TID, each supplement provides 350 kcal and 20 grams of protein -MVI with minerals daily -Magic cup TID with meals, each supplement provides 290 kcal and 9 grams of protein  NUTRITION DIAGNOSIS:   Increased nutrient needs related to acute illness (COVID-19) as evidenced by estimated needs.  GOAL:   Patient will meet greater than or equal to 90% of their needs  MONITOR:   PO intake,Supplement acceptance,Labs,Weight trends,Skin,I & O's  REASON FOR ASSESSMENT:   Malnutrition Screening Tool    ASSESSMENT:   Jack Hodges is a 86 y.o. male with medical history significant of CAD status post CABG, severe aortic stenosis status post TAVR, CVA, dementia, depression, hypertension, hyperlipidemia, GERD, history of recurrent falls presenting to the ED with a chief complaint of generalized weakness.  Patient tested positive for Covid 2 days ago (SARS-CoV-2 antigen test positive on 03/11/2020).  History limited as patient is hard of hearing.  Reports feeling ill for the past 3 weeks.  He is having fevers, generalized weakness, cough, shortness of breath, nausea, vomiting, poor oral intake, and diarrhea.  Denies chest pain.  Denies abdominal pain or dysuria.  He is not vaccinated against Covid.  Pt admitted with COVID-19 viral pneumonia.   Reviewed I/O's: +530 ml x 24 hours  Attempted to speak with pt via call to hospital room phone, however, unable to reach.   Per H&P, pt with decreased oral intake over the past week secondary to poor appetite as well as nausea, vomiting, and diarrhea. Meal completions documented at 25%. Ensure supplements have been ordered; pt accepting per [facility].  Po 25%  Reviewed wt hx; pt has experienced a 3.5% wt loss over the past 6 months, which is not significant for time frame.   Pt with increased nutritional needs secondary to COVID-19 and would  greatly benefit from addition of oral nutrition supplements.   Medications reviewed and include vitamin C, vitamin D3, remdesivir, and zinc sulfate.   Labs reviewed.   Diet Order:   Diet Order            Diet Heart Room service appropriate? Yes; Fluid consistency: Thin  Diet effective now                 EDUCATION NEEDS:   No education needs have been identified at this time  Skin:  Skin Assessment: Reviewed RN Assessment  Last BM:  03/14/20  Height:   Ht Readings from Last 1 Encounters:  03/14/20 5\' 6"  (1.676 m)    Weight:   Wt Readings from Last 1 Encounters:  03/14/20 74.8 kg    Ideal Body Weight:  64.5 kg  BMI:  Body mass index is 26.62 kg/m.  Estimated Nutritional Needs:   Kcal:  2050-2250  Protein:  100-115 grams  Fluid:  > 2 L    10-26-1978, RD, LDN, CDCES Registered Dietitian II Certified Diabetes Care and Education Specialist Please refer to [facility] for RD and/or RD on-call/weekend/after hours pager

## 2020-03-15 NOTE — TOC Initial Note (Signed)
Transition of Care [facility]) - Initial/Assessment Note    Patient Details  Name: Jack Hodges MRN: [id number] Date of Birth: [birth year]  [facility] CM/SW Contact:    Mearl Latin, LCSW Phone Number: 03/15/2020, 4:07 PM  Clinical Narrative:                 CSW received consult for possible SNF placement at time of discharge. CSW spoke with patient. Patient reported that patient's son works second shift and also has COVID at this time. Patient expressed understanding of PT recommendation but is declining SNF (even to try it for a few days) and requests home health services. He thinks he has had [facility] before. CSW discussed equipment needs with patient and he stated he has a walker at home. He declined need for 3in1 BSC. CSW confirmed PCP and address with patient. Patient states his son will come pick him up at discharge. No further questions reported at this time.    Expected Discharge Plan: Home w Home Health Services Barriers to Discharge: Continued Medical Work up   Patient Goals and CMS Choice Patient states their goals for this hospitalization and ongoing recovery are:: Return home CMS Medicare.gov Compare Post Acute Care list provided to:: Patient Choice offered to / list presented to : Patient  Expected Discharge Plan and Services Expected Discharge Plan: Home w Home Health Services In-house Referral: Clinical Social Work Discharge Planning Services: CM Consult Post Acute Care Choice: Home Health Living arrangements for the past 2 months: Single Family Home                                      Prior Living Arrangements/Services Living arrangements for the past 2 months: Single Family Home Lives with:: Adult Children Patient language and need for interpreter reviewed:: Yes Do you feel safe going back to the place where you live?: Yes      Need for Family Participation in Patient Care: Yes (Comment) Care giver support system in place?: Yes  (comment) Current home services: DME (walker) Criminal Activity/Legal Involvement Pertinent to Current Situation/Hospitalization: No - Comment as needed  Activities of Daily Living Home Assistive Devices/Equipment: Grab bars in shower ADL Screening (condition at time of admission) Patient's cognitive ability adequate to safely complete daily activities?: Yes Is the patient deaf or have difficulty hearing?: Yes Does the patient have difficulty seeing, even when wearing glasses/contacts?: No Does the patient have difficulty concentrating, remembering, or making decisions?: No Patient able to express need for assistance with ADLs?: Yes Does the patient have difficulty dressing or bathing?: No Independently performs ADLs?: Yes (appropriate for developmental age) Does the patient have difficulty walking or climbing stairs?: No Weakness of Legs: Both Weakness of Arms/Hands: None  Permission Sought/Granted Permission sought to share information with : Facility Industrial/product designer granted to share information with : Yes, Verbal Permission Granted     Permission granted to share info w AGENCY: [facility]        Emotional Assessment   Attitude/Demeanor/Rapport: Engaged,Gracious,Charismatic Affect (typically observed): Accepting,Appropriate,Pleasant Orientation: : Oriented to Self,Oriented to Place,Oriented to  Time,Oriented to Situation Alcohol / Substance Use: Not Applicable Psych Involvement: No (comment)  Admission diagnosis:  Generalized weakness [R53.1] Pneumonia due to COVID-19 virus [U07.1, J12.82] Patient Active Problem List   Diagnosis Date Noted  . Pneumonia due to COVID-19 virus 03/13/2020  . Generalized weakness 03/13/2020  . Diarrhea 03/13/2020  .  Metabolic acidosis 03/13/2020  . Thrombocytopenia (HCC) 03/13/2020  . History of aortic stenosis 03/11/2020  . Acute bursitis of left shoulder 01/04/2020  . 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   . Angina pectoris (HCC) 10/07/2019  . Atherosclerosis of aorta (HCC) 09/24/2019  . BMI 27.0-27.9,adult 09/24/2019  . Cellulitis of left hand 07/07/2019  . Recurrent falls 06/25/2019  . Other chest pain   . Herpes zoster 05/19/2019  . Dyspnea on exertion 06/30/2018  . Bilateral foot-drop 01/18/2017  . 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 Dizziness 04/19/2016  . CVA (cerebral vascular accident) (HCC) 04/03/2016  . DNR (do not resuscitate) 04/03/2016  . Coronary artery disease involving native coronary artery of native heart without angina pectoris 11/01/2015  . Dyslipidemia 11/01/2015  . Essential hypertension 11/01/2015  . Status post coronary artery bypass graft 11/01/2015   PCP:  Abigail Miyamoto, MD Pharmacy:   [facility] - [address], [place] - [address] [place] [address] Phone: [phone] Fax: [phone]  [facility] - [place], [place] - [address] [place], [address] [place] [address] Phone: [phone] Fax: [phone]     Social Determinants of Health (SDOH) Interventions    Readmission Risk Interventions No flowsheet data found.

## 2020-03-15 NOTE — Progress Notes (Signed)
PROGRESS NOTE    Jack Hodges  ATF:[id number] DOB: [birth year] DOA: 03/12/2020 PCP: Abigail Miyamoto, MD     Brief Narrative:  [name] admitted to the hospital with a working diagnosis of SARS COVID-19 viral pneumonia.  86 year old male with past medical history for coronary artery disease status post bypass grafting, severe aortic stenosis status post TAVR, history of CVA, hypertension, dyslipidemia,dementia and depression. Patient tested for SARS COVID-19 03/11/2020, his symptoms were consistent with fevers, generalized weakness, cough, nausea, vomiting, diarrhea, poor oral intake, and dyspnea. He has not been vaccinated for COVID-19. Due to persistent and worsening symptoms he presented to the hospital. On his initial physical examination he was tachypneic 28 breaths/min, heart rate 99 bpm, blood pressure 153/87, temperature 98.3, oxygen saturation 98%,he had dry mucous membranes, his lungs had no wheezing, heart S1-S2 present, rhythmic, abdomen soft, no lower extremity edema.  Sodium 135, potassium 3.7, chloride 102, bicarb 17, BUN 14, creatinine 1.15, magnesium 1.8, white count 8.5, hemoglobin 12.3, hematocrit 34.8, platelets 89. Urinalysis specific gravity 1.025, 100 protein, 6-10 white cells. Chest radiograph had right base atelectasis and left retrocardiac infiltrate. EKG 81 bpm, rightward axis, right bundle branch block, QTc505, sinus rhythm, no significant ST segment, V1 through V4 T wave versions.   Subjective: Afebrile overnight, patient believes Covid vaccination is a government conspiracy, and that anything the government produces is bad for you.    Assessment & Plan: Covid vaccination; unvaccinated   Principal Problem:   Pneumonia due to COVID-19 virus Active Problems:   Generalized weakness   Diarrhea   Metabolic acidosis   Thrombocytopenia (HCC)   SARS COVID-19 viral pneumonia/left lower lobe COVID-19 Labs  Recent Labs     03/13/20 0505 03/14/20 0504 03/15/20 0049  DDIMER 2.17* 1.75* 0.97*  FERRITIN 651* 690* 674*  LDH 391*  --   --   CRP 6.1* 7.7* 3.4*    Lab Results  Component Value Date   SARSCOV2NAA NEGATIVE 10/23/2019   -Inflammatory markers slowly trending down -Remdesivir per pharmacy protocol - Solu-Medrol 80 mg BID -Combivent QID -Vitamins per Covid protocol -Flutter valve -Incentive spirometry -Out of bed to chair q shift -Place PT/OT consult in the a.m.  Diarrhea -Most likely secondary to Covid gastroenteritis. -Improved with loperamide.    Follow up with PT/OT, encourage to be out of bed to chair tid with meals.   AKI with acute anion gap metabolic acidosis -Anion gap metabolic acidosis resolved Lab Results  Component Value Date   CREATININE 0.89 03/15/2020   CREATININE 0.79 03/14/2020   CREATININE 0.87 03/13/2020   CREATININE 1.15 03/12/2020   CREATININE 0.74 10/28/2019  AKI resolved  Essential HTN/CAD -S/p CABG -Unable to start beta-blocker secondary to patient's episodes of bradycardia -12/21 lisinopril 2.5 mg daily  Aortic stenosis -S/p TAVR  Chronic Anemia -Anemia panel pending -Occult blood pending   Leukocytosis -Resolved most likely reactive  Thrombocytopenia      DVT prophylaxis: Lovenox Code Status: Full Family Communication:  Status is: Inpatient    Dispo: The patient is from: Home              Anticipated d/c is to: Home              Anticipated d/c date is: 12/25              Patient currently will unstable      Consultants:    Procedures/Significant Events:    I have personally reviewed and interpreted all radiology studies and my  findings are as above.  VENTILATOR SETTINGS: Room air 12/21 SPO2 95%   Cultures   Antimicrobials:    Devices    LINES / TUBES:      Continuous Infusions: . sodium chloride    . remdesivir 100 mg in NS 100 mL 100 mg (03/15/20 0843)     Objective: Vitals:   03/14/20  2346 03/15/20 0346 03/15/20 0347 03/15/20 1236  BP: 126/82 140/67 140/67 (!) 147/74  Pulse: 67 61 62 65  Resp: (!) 21  18 16   Temp: (!) 97.5 F (36.4 C)  97.9 F (36.6 C) 97.6 F (36.4 C)  TempSrc: Oral  Oral Oral  SpO2: 97% 93% 94% 95%  Weight:      Height:        Intake/Output Summary (Last 24 hours) at 03/15/2020 1332 Last data filed at 03/15/2020 1233 Gross per 24 hour  Intake 550 ml  Output 1 ml  Net 549 ml   Filed Weights   03/13/20 0242 03/14/20 1540  Weight: 74.8 kg 74.8 kg    Examination:  General: A/O x4, positive acute respiratory distress Eyes: negative scleral hemorrhage, negative anisocoria, negative icterus ENT: Negative Runny nose, negative gingival bleeding, Neck:  Negative scars, masses, torticollis, lymphadenopathy, JVD Lungs: decreased breath sounds bilaterally without wheezes or crackles Cardiovascular: Regular rate and rhythm without murmur gallop or rub normal S1 and S2 Abdomen: negative abdominal pain, nondistended, positive soft, bowel sounds, no rebound, no ascites, no appreciable mass Extremities: No significant cyanosis, clubbing, or edema bilateral lower extremities Skin: Negative rashes, lesions, ulcers Psychiatric:  Negative depression, negative anxiety, negative fatigue, negative mania  Central nervous system:  Cranial nerves II through XII intact, tongue/uvula midline, all extremities muscle strength 5/5, sensation intact throughout, negative dysarthria, negative expressive aphasia, negative receptive aphasia.  .     Data Reviewed: Care during the described time interval was provided by me .  I have reviewed this patient's available data, including medical history, events of note, physical examination, and all test results as part of my evaluation.  CBC: Recent Labs  Lab 03/12/20 1723 03/13/20 1236 03/15/20 0049  WBC 8.5 11.6* 10.1  NEUTROABS  --   --  8.9*  HGB 12.3* 11.8* 11.1*  HCT 34.8* 35.2* 31.4*  MCV 84.1 85.0 84.2  PLT  89* 99* 130*   Basic Metabolic Panel: Recent Labs  Lab 03/12/20 1723 03/13/20 0505 03/13/20 1139 03/14/20 0504 03/15/20 0049  NA 135  --  139 141 138  K 3.7  --  3.2* 3.9 3.7  CL 102  --  107 108 109  CO2 17*  --  17* 21* 19*  GLUCOSE 136*  --  120* 166* 178*  BUN 14  --  16 17 23   CREATININE 1.15  --  0.87 0.79 0.89  CALCIUM 9.1  --  8.7* 9.1 9.0  MG  --  1.8  --   --   --    GFR: Estimated Creatinine Clearance: 52.8 mL/min (by C-G formula based on SCr of 0.89 mg/dL). Liver Function Tests: Recent Labs  Lab 03/13/20 0505 03/13/20 1139 03/15/20 0049  AST 49* 53* 39  ALT 27 26 25   ALKPHOS 56 64 57  BILITOT 1.8* 1.1 0.5  PROT 6.2* 6.1* 5.7*  ALBUMIN 3.3* 3.3* 3.0*   No results for input(s): LIPASE, AMYLASE in the last 168 hours. No results for input(s): AMMONIA in the last 168 hours. Coagulation Profile: No results for input(s): INR, PROTIME in the last 168 hours.  Cardiac Enzymes: No results for input(s): CKTOTAL, CKMB, CKMBINDEX, TROPONINI in the last 168 hours. BNP (last 3 results) No results for input(s): PROBNP in the last 8760 hours. HbA1C: No results for input(s): HGBA1C in the last 72 hours. CBG: No results for input(s): GLUCAP in the last 168 hours. Lipid Profile: Recent Labs    03/13/20 0506  TRIG 106   Thyroid Function Tests: No results for input(s): TSH, T4TOTAL, FREET4, T3FREE, THYROIDAB in the last 72 hours. Anemia Panel: Recent Labs    03/14/20 0504 03/15/20 0049  FERRITIN 690* 674*   Sepsis Labs: Recent Labs  Lab 03/13/20 0505 03/13/20 0506 03/13/20 0739  PROCALCITON 0.16  --   --   LATICACIDVEN  --  1.3 0.9    Recent Results (from the past 240 hour(s))  Blood Culture (routine x 2)     Status: None (Preliminary result)   Collection Time: 03/13/20  5:06 AM   Specimen: BLOOD  Result Value Ref Range Status   Specimen Description BLOOD RIGHT ARM  Final   Special Requests   Final    BOTTLES DRAWN AEROBIC AND ANAEROBIC Blood Culture  results may not be optimal due to an excessive volume of blood received in culture bottles   Culture   Final    NO GROWTH 2 DAYS Performed at [facility], [address] [place], [place] [address]    Report Status PENDING  Incomplete  Blood Culture (routine x 2)     Status: None (Preliminary result)   Collection Time: 03/13/20  5:06 AM   Specimen: BLOOD  Result Value Ref Range Status   Specimen Description BLOOD LEFT ARM  Final   Special Requests   Final    BOTTLES DRAWN AEROBIC ONLY Blood Culture adequate volume   Culture   Final    NO GROWTH 2 DAYS Performed at [facility], [address], [place], [place] [address]    Report Status PENDING  Incomplete         Radiology Studies: No results found.      Scheduled Meds: . vitamin C  500 mg Oral Daily  . chlorpheniramine-HYDROcodone  5 mL Oral Q12H  . cholecalciferol  1,000 Units Oral Daily  . enoxaparin (LOVENOX) injection  40 mg Subcutaneous Q24H  . feeding supplement  237 mL Oral TID BM  . Ipratropium-Albuterol  1 puff Inhalation QID  . methylPREDNISolone (SOLU-MEDROL) injection  80 mg Intravenous Q12H  . multivitamin with minerals  1 tablet Oral Daily  . zinc sulfate  220 mg Oral Daily   Continuous Infusions: . sodium chloride    . remdesivir 100 mg in NS 100 mL 100 mg (03/15/20 0843)     LOS: 2 days    Time spent:40 min    Adelee Hannula, Roselind Messier, MD [facility] Pager [phone]  If 7PM-7AM, please contact night-coverage [email or web address] Password [email or web address] 03/15/2020, 1:32 PM

## 2020-03-16 LAB — RETICULOCYTES
Immature Retic Fract: 17.7 % — ABNORMAL HIGH (ref 2.3–15.9)
RBC.: 3.83 MIL/uL — ABNORMAL LOW (ref 4.22–5.81)
Retic Count, Absolute: 37.5 10*3/uL (ref 19.0–186.0)
Retic Ct Pct: 1 % (ref 0.4–3.1)

## 2020-03-16 LAB — COMPREHENSIVE METABOLIC PANEL
ALT: 27 U/L (ref 0–44)
AST: 28 U/L (ref 15–41)
Albumin: 3.1 g/dL — ABNORMAL LOW (ref 3.5–5.0)
Alkaline Phosphatase: 60 U/L (ref 38–126)
Anion gap: 9 (ref 5–15)
BUN: 24 mg/dL — ABNORMAL HIGH (ref 8–23)
CO2: 23 mmol/L (ref 22–32)
Calcium: 9.1 mg/dL (ref 8.9–10.3)
Chloride: 107 mmol/L (ref 98–111)
Creatinine, Ser: 0.81 mg/dL (ref 0.61–1.24)
GFR, Estimated: >60 mL/min (ref >60)
Glucose, Bld: 165 mg/dL — ABNORMAL HIGH (ref 70–99)
Potassium: 4.1 mmol/L (ref 3.5–5.1)
Sodium: 139 mmol/L (ref 135–145)
Total Bilirubin: 0.9 mg/dL (ref 0.3–1.2)
Total Protein: 5.6 g/dL — ABNORMAL LOW (ref 6.5–8.1)

## 2020-03-16 LAB — IRON AND TIBC
Iron: 89 ug/dL (ref 45–182)
Saturation Ratios: 40 % — ABNORMAL HIGH (ref 17.9–39.5)
TIBC: 221 ug/dL — ABNORMAL LOW (ref 250–450)
UIBC: 132 ug/dL

## 2020-03-16 LAB — MAGNESIUM: Magnesium: 2.1 mg/dL (ref 1.7–2.4)

## 2020-03-16 LAB — C-REACTIVE PROTEIN: CRP: 1.4 mg/dL — ABNORMAL HIGH (ref <1.0)

## 2020-03-16 LAB — FERRITIN: Ferritin: 506 ng/mL — ABNORMAL HIGH (ref 24–336)

## 2020-03-16 LAB — FOLATE: Folate: 14.3 ng/mL (ref >5.9)

## 2020-03-16 LAB — D-DIMER, QUANTITATIVE: D-Dimer, Quant: 0.49 ug/mL-FEU (ref 0.00–0.50)

## 2020-03-16 LAB — VITAMIN B12: Vitamin B-12: 1420 pg/mL — ABNORMAL HIGH (ref 180–914)

## 2020-03-16 LAB — LACTATE DEHYDROGENASE: LDH: 327 U/L — ABNORMAL HIGH (ref 98–192)

## 2020-03-16 LAB — PHOSPHORUS: Phosphorus: 2.5 mg/dL (ref 2.5–4.6)

## 2020-03-16 MED ORDER — SERTRALINE HCL 50 MG PO TABS
50.0000 mg | ORAL_TABLET | Freq: Every morning | ORAL | Status: DC
  Administered 2020-03-16 – 2020-03-17 (×2): 50 mg via ORAL
  Filled 2020-03-16 (×2): qty 1

## 2020-03-16 MED ORDER — ROSUVASTATIN CALCIUM 20 MG PO TABS
20.0000 mg | ORAL_TABLET | Freq: Every day | ORAL | Status: DC
  Administered 2020-03-16: 21:00:00 20 mg via ORAL
  Filled 2020-03-16: qty 1

## 2020-03-16 MED ORDER — CLOPIDOGREL BISULFATE 75 MG PO TABS
75.0000 mg | ORAL_TABLET | Freq: Every day | ORAL | Status: DC
  Administered 2020-03-16 – 2020-03-17 (×2): 75 mg via ORAL
  Filled 2020-03-16 (×2): qty 1

## 2020-03-16 MED ORDER — ALUM & MAG HYDROXIDE-SIMETH 200-200-20 MG/5ML PO SUSP
30.0000 mL | ORAL | Status: DC | PRN
  Administered 2020-03-16: 11:00:00 30 mL via ORAL
  Filled 2020-03-16: qty 30

## 2020-03-16 MED ORDER — GABAPENTIN 300 MG PO CAPS
300.0000 mg | ORAL_CAPSULE | Freq: Two times a day (BID) | ORAL | Status: DC
  Administered 2020-03-16 – 2020-03-17 (×3): 300 mg via ORAL
  Filled 2020-03-16 (×3): qty 1

## 2020-03-16 NOTE — Progress Notes (Signed)
Occupational Therapy Evaluation Patient Details Name: Jack Hodges MRN: [id number] DOB: [birth year] Today's Date: 03/16/2020    History of Present Illness 86 y.o. male with medical history significant of CAD status post CABG, severe aortic stenosis status post TAVR, CVA, dementia, depression, hypertension, hyperlipidemia, GERD, history of recurrent falls presenting to the ED with a chief complaint of generalized weakness, testing positive for COVID 2 dayd prior to presenting to ED. Chest x-ray showing left basilar opacity concerning for PNA.   Clinical Impression   PTA pt lives at home with his son and is modified independent with mobility and ADL tasks using his straight cane and "furniture surfing" in the house. Pt requires min guard A for ADL and min A this session for mobility at RW level, however gait pattern improved toward end of session. Able to ambulate @ 150 ft on RA with SpO2 remaining in the 90s with 1/4 DOE. Educated pt on risk for falls and recommend direct assistance with all mobility and  ADL given his high fall risk. Pt verbalized understanding. Pt will benefit from HHOT. Will follow acutely.  Encourage nursing staff to ambulate pt/use bathroom @ RW level to improve safety with mobility and facilitate safe DC home.     Follow Up Recommendations  Home health OT;Other (comment) (S with mobility and ADL)    Equipment Recommendations  None recommended by OT    Recommendations for Other Services       Precautions / Restrictions Precautions Precautions: Fall Required Braces or Orthoses:  (L AFO)      Mobility Bed Mobility Overal bed mobility: Modified Independent                  Transfers Overall transfer level: Needs assistance   Transfers: Sit to/from Stand Sit to Stand: Min assist              Balance Overall balance assessment: History of Falls;Needs assistance   Sitting balance-Leahy Scale: Good       Standing balance-Leahy Scale: Poor                              ADL either performed or assessed with clinical judgement   ADL Overall ADL's : Needs assistance/impaired   Eating/Feeding Details (indicate cue type and reason): may benefit from tubing for utensils Grooming: Set up   Upper Body Bathing: Set up;Sitting   Lower Body Bathing: Min guard;Sit to/from stand   Upper Body Dressing : Set up;Sitting   Lower Body Dressing: Min guard;Sit to/from stand   Toilet Transfer: Minimal assistance   Toileting- Architect and Hygiene: Min guard       Functional mobility during ADLs: Minimal assistance;Rolling walker;Cueing for safety General ADL Comments: Required rest break of @ 7 minutes after donning socks and AFO/shoes due to fatigue     Vision Baseline Vision/History: No visual deficits       Perception     Praxis      Pertinent Vitals/Pain Pain Assessment: No/denies pain     Hand Dominance Right   Extremity/Trunk Assessment Upper Extremity Assessment Upper Extremity Assessment: Generalized weakness;RUE deficits/detail;LUE deficits/detail RUE Sensation: history of peripheral neuropathy RUE Coordination: decreased fine motor LUE Sensation: history of peripheral neuropathy LUE Coordination: decreased fine motor   Lower Extremity Assessment Lower Extremity Assessment: Defer to PT evaluation   Cervical / Trunk Assessment Cervical / Trunk Assessment: Normal   Communication Communication Communication: HOH   Cognition Arousal/Alertness:  Awake/alert Behavior During Therapy: WFL for tasks assessed/performed Overall Cognitive Status: No family/caregiver present to determine baseline cognitive functioning                                 General Comments: most likely baseline   General Comments  Educated pt on reducing risk of falls. Pt with increased balance deficits since hainvg Covid, however has "his way of moving". Pt donned AFO and initially tried to ambulate  using his cane which was exteremly unsafe, requiring assistance to prevent fall. Pt then agreeable to using RW    Exercises     Shoulder Instructions      Home Living Family/patient expects to be discharged to:: Private residence Living Arrangements: Children Available Help at Discharge: Family;Available PRN/intermittently Type of Home: House Home Access: Stairs to enter [employer] of Steps: 1 Entrance Stairs-Rails: None Home Layout: One level;Laundry or work area in basement     [employer] Shower/Tub: Producer, television/film/video: Standard Bathroom Accessibility: Yes How Accessible: Accessible via walker Home Equipment: Environmental consultant - 2 wheels;Cane - single point          Prior Functioning/Environment Level of Independence: Independent with assistive device(s)        Comments: pt reports ambulating and performing ADLs independently with use of cane, also reports multiple falls with the last being 2-3 months ago        OT Problem List: Decreased activity tolerance;Impaired balance (sitting and/or standing);Decreased safety awareness;Decreased knowledge of use of DME or AE;Cardiopulmonary status limiting activity;Impaired sensation      OT Treatment/Interventions: Self-care/ADL training;Therapeutic exercise;Neuromuscular education;Energy conservation;DME and/or AE instruction;Therapeutic activities;Patient/family education;Balance training    OT Goals(Current goals can be found in the care plan section) Acute Rehab OT Goals Patient Stated Goal: to go home and mow OT Goal Formulation: With patient Time For Goal Achievement: 03/30/20 Potential to Achieve Goals: Good  OT Frequency: Min 3X/week   Barriers to D/C:            Co-evaluation              AM-PAC OT "6 Clicks" Daily Activity     Outcome Measure Help from another person eating meals?: A Little Help from another person taking care of personal grooming?: A Little Help from another person  toileting, which includes using toliet, bedpan, or urinal?: A Little Help from another person bathing (including washing, rinsing, drying)?: A Little Help from another person to put on and taking off regular upper body clothing?: A Little Help from another person to put on and taking off regular lower body clothing?: A Little 6 Click Score: 18   End of Session Equipment Utilized During Treatment: Gait belt;Rolling walker Nurse Communication: Mobility status  Activity Tolerance: Patient tolerated treatment well Patient left: in chair;with call bell/phone within reach  OT Visit Diagnosis: Unsteadiness on feet (R26.81);Other abnormalities of gait and mobility (R26.89);Muscle weakness (generalized) (M62.81);History of falling (Z91.81)                Time: 1350-1419 OT Time Calculation (min): 29 min Charges:  OT General Charges $OT Visit: 1 Visit OT Evaluation $OT Eval Moderate Complexity: 1 Mod OT Treatments $Self Care/Home Management : 8-22 mins  Luisa Dago, OT/L   Acute OT Clinical Specialist Acute Rehabilitation Services Pager [phone] Office [phone]   [facility] 03/16/2020, 4:28 PM

## 2020-03-16 NOTE — Progress Notes (Addendum)
PROGRESS NOTE    Jack Hodges  BSJ:[id number] DOB: 30-Sep-1930 DOA: 03/12/2020 PCP: Abigail Miyamoto, MD     Brief Narrative:  Mr. Stock admitted to the hospital with a working diagnosis of SARS COVID-19 viral pneumonia.  86 year old male with past medical history for coronary artery disease status post bypass grafting, severe aortic stenosis status post TAVR, history of CVA, hypertension, dyslipidemia,dementia and depression. Patient tested for SARS COVID-19 03/11/2020, his symptoms were consistent with fevers, generalized weakness, cough, nausea, vomiting, diarrhea, poor oral intake, and dyspnea. He has not been vaccinated for COVID-19. Due to persistent and worsening symptoms he presented to the hospital. On his initial physical examination he was tachypneic 28 breaths/min, heart rate 99 bpm, blood pressure 153/87, temperature 98.3, oxygen saturation 98%,he had dry mucous membranes, his lungs had no wheezing, heart S1-S2 present, rhythmic, abdomen soft, no lower extremity edema.  Sodium 135, potassium 3.7, chloride 102, bicarb 17, BUN 14, creatinine 1.15, magnesium 1.8, white count 8.5, hemoglobin 12.3, hematocrit 34.8, platelets 89. Urinalysis specific gravity 1.025, 100 protein, 6-10 white cells. Chest radiograph had right base atelectasis and left retrocardiac infiltrate. EKG 81 bpm, rightward axis, right bundle branch block, QTc505, sinus rhythm, no significant ST segment, V1 through V4 T wave versions.   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    Assessment & Plan: Covid vaccination; unvaccinated   Principal Problem:   Pneumonia due to COVID-19 virus Active Problems:   Generalized weakness   Diarrhea   Metabolic acidosis   Thrombocytopenia (HCC)   SARS COVID-19 viral pneumonia/left lower lobe COVID-19 Labs  Recent Labs    03/14/20 0504 03/15/20 0049 03/16/20 0052  DDIMER 1.75* 0.97*  0.49  FERRITIN 690* 674* 506*  LDH  --   --  327*  CRP 7.7* 3.4* 1.4*    Lab Results  Component Value Date   SARSCOV2NAA NEGATIVE 10/23/2019   -Inflammatory markers slowly trending down -Remdesivir per pharmacy protocol stop date 12/23 - Solu-Medrol 80 mg BID -Combivent QID -Vitamins per Covid protocol -Flutter valve -Incentive spirometry -Out of bed to chair q shift -Place PT/OT consult in the a.m. -12/22 ambulatory SPO2 pending  Diarrhea -Most likely secondary to Covid gastroenteritis. -Improved with loperamide.    -Follow up with PT/OT, encourage to be out of bed to chair tid with meals.   AKI with acute anion gap metabolic acidosis -Anion gap metabolic acidosis resolved Lab Results  Component Value Date   CREATININE 0.81 03/16/2020   CREATININE 0.89 03/15/2020   CREATININE 0.79 03/14/2020   CREATININE 0.87 03/13/2020   CREATININE 1.15 03/12/2020  AKI resolved  Essential HTN/CAD -S/p CABG -Unable to start beta-blocker secondary to patient's episodes of bradycardia -12/21 lisinopril 2.5 mg daily  Aortic stenosis -S/p TAVR  Chronic Anemia -Anemia panel pending -Occult blood pending   Leukocytosis -Resolved most likely reactive  Thrombocytopenia      DVT prophylaxis: Lovenox Code Status: Full Family Communication: 12/22 spoke with son [name] counseled him on plan of care answered all questions Status is: Inpatient    Dispo: The patient is from: Home              Anticipated d/c is to: Home              Anticipated d/c date is: 12/25              Patient currently will unstable      Consultants:    Procedures/Significant Events:  I have personally reviewed and interpreted all radiology studies and my findings are as above.  VENTILATOR SETTINGS: Room air 12/22 SPO2 95%   Cultures   Antimicrobials: Anti-infectives (From admission, onward)   Start     Ordered Stop   03/14/20 1000  remdesivir 100 mg in sodium chloride 0.9 %  100 mL IVPB       "Followed by" Linked Group Details   03/13/20 0401 03/18/20 0959   03/13/20 0500  remdesivir 200 mg in sodium chloride 0.9% 250 mL IVPB       "Followed by" Linked Group Details   03/13/20 0401 03/13/20 0741       Devices    LINES / TUBES:      Continuous Infusions: . sodium chloride    . remdesivir 100 mg in NS 100 mL 100 mg (03/15/20 0843)     Objective: Vitals:   03/15/20 1236 03/15/20 1944 03/15/20 2142 03/16/20 0442  BP: (!) 147/74 111/64 (!) 128/54 (!) 146/72  Pulse: 65 76  (!) 57  Resp: 16 16  18   Temp: 97.6 F (36.4 C) 97.9 F (36.6 C)  97.7 F (36.5 C)  TempSrc: Oral Oral  Oral  SpO2: 95% 92%  91%  Weight:      Height:        Intake/Output Summary (Last 24 hours) at 03/16/2020 0913 Last data filed at 03/16/2020 0443 Gross per 24 hour  Intake 410 ml  Output 1 ml  Net 409 ml   Filed Weights   03/13/20 0242 03/14/20 1540  Weight: 74.8 kg 74.8 kg    Examination:  General: A/O x4, positive acute respiratory distress Eyes: negative scleral hemorrhage, negative anisocoria, negative icterus ENT: Negative Runny nose, negative gingival bleeding, Neck:  Negative scars, masses, torticollis, lymphadenopathy, JVD Lungs: decreased breath sounds bilaterally without wheezes or crackles Cardiovascular: Regular rate and rhythm without murmur gallop or rub normal S1 and S2 Abdomen: negative abdominal pain, nondistended, positive soft, bowel sounds, no rebound, no ascites, no appreciable mass Extremities: No significant cyanosis, clubbing, or edema bilateral lower extremities Skin: Negative rashes, lesions, ulcers Psychiatric:  Negative depression, negative anxiety, negative fatigue, negative mania  Central nervous system:  Cranial nerves II through XII intact, tongue/uvula midline, all extremities muscle strength 5/5, sensation intact throughout, negative dysarthria, negative expressive aphasia, negative receptive aphasia.  .     Data  Reviewed: Care during the described time interval was provided by me .  I have reviewed this patient's available data, including medical history, events of note, physical examination, and all test results as part of my evaluation.  CBC: Recent Labs  Lab 03/12/20 1723 03/13/20 1236 03/15/20 0049  WBC 8.5 11.6* 10.1  NEUTROABS  --   --  8.9*  HGB 12.3* 11.8* 11.1*  HCT 34.8* 35.2* 31.4*  MCV 84.1 85.0 84.2  PLT 89* 99* 130*   Basic Metabolic Panel: Recent Labs  Lab 03/12/20 1723 03/13/20 0505 03/13/20 1139 03/14/20 0504 03/15/20 0049 03/16/20 0052  NA 135  --  139 141 138 139  K 3.7  --  3.2* 3.9 3.7 4.1  CL 102  --  107 108 109 107  CO2 17*  --  17* 21* 19* 23  GLUCOSE 136*  --  120* 166* 178* 165*  BUN 14  --  16 17 23  24*  CREATININE 1.15  --  0.87 0.79 0.89 0.81  CALCIUM 9.1  --  8.7* 9.1 9.0 9.1  MG  --  1.8  --   --   --  2.1  PHOS  --   --   --   --   --  2.5   GFR: Estimated Creatinine Clearance: 58 mL/min (by C-G formula based on SCr of 0.81 mg/dL). Liver Function Tests: Recent Labs  Lab 03/13/20 0505 03/13/20 1139 03/15/20 0049 03/16/20 0052  AST 49* 53* 39 28  ALT 27 26 25 27   ALKPHOS 56 64 57 60  BILITOT 1.8* 1.1 0.5 0.9  PROT 6.2* 6.1* 5.7* 5.6*  ALBUMIN 3.3* 3.3* 3.0* 3.1*   No results for input(s): LIPASE, AMYLASE in the last 168 hours. No results for input(s): AMMONIA in the last 168 hours. Coagulation Profile: No results for input(s): INR, PROTIME in the last 168 hours. Cardiac Enzymes: No results for input(s): CKTOTAL, CKMB, CKMBINDEX, TROPONINI in the last 168 hours. BNP (last 3 results) No results for input(s): PROBNP in the last 8760 hours. HbA1C: No results for input(s): HGBA1C in the last 72 hours. CBG: No results for input(s): GLUCAP in the last 168 hours. Lipid Profile: No results for input(s): CHOL, HDL, LDLCALC, TRIG, CHOLHDL, LDLDIRECT in the last 72 hours. Thyroid Function Tests: No results for input(s): TSH, T4TOTAL,  FREET4, T3FREE, THYROIDAB in the last 72 hours. Anemia Panel: Recent Labs    03/15/20 0049 03/16/20 0042 03/16/20 0052  VITAMINB12  --   --  1,420*  FOLATE  --   --  14.3  FERRITIN 674*  --  506*  TIBC  --   --  221*  IRON  --   --  89  RETICCTPCT  --  1.0  --    Sepsis Labs: Recent Labs  Lab 03/13/20 0505 03/13/20 0506 03/13/20 0739  PROCALCITON 0.16  --   --   LATICACIDVEN  --  1.3 0.9    Recent Results (from the past 240 hour(s))  Blood Culture (routine x 2)     Status: None (Preliminary result)   Collection Time: 03/13/20  5:06 AM   Specimen: BLOOD  Result Value Ref Range Status   Specimen Description BLOOD RIGHT ARM  Final   Special Requests   Final    BOTTLES DRAWN AEROBIC AND ANAEROBIC Blood Culture results may not be optimal due to an excessive volume of blood received in culture bottles   Culture   Final    NO GROWTH 2 DAYS Performed at [facility], [address] [place], [place] [address]    Report Status PENDING  Incomplete  Blood Culture (routine x 2)     Status: None (Preliminary result)   Collection Time: 03/13/20  5:06 AM   Specimen: BLOOD  Result Value Ref Range Status   Specimen Description BLOOD LEFT ARM  Final   Special Requests   Final    BOTTLES DRAWN AEROBIC ONLY Blood Culture adequate volume   Culture   Final    NO GROWTH 2 DAYS Performed at [facility], [address] [place], [place] [address]    Report Status PENDING  Incomplete         Radiology Studies: No results found.      Scheduled Meds: . vitamin C  500 mg Oral Daily  . chlorpheniramine-HYDROcodone  5 mL Oral Q12H  . cholecalciferol  1,000 Units Oral Daily  . enoxaparin (LOVENOX) injection  40 mg Subcutaneous Q24H  . feeding supplement  237 mL Oral TID BM  . Ipratropium-Albuterol  1 puff Inhalation QID  . lisinopril  2.5 mg Oral Daily  . methylPREDNISolone (SOLU-MEDROL) injection  80 mg Intravenous Q12H  . multivitamin with minerals  1 tablet Oral  Daily  . zinc sulfate  220 mg Oral Daily   Continuous Infusions: . sodium chloride    . remdesivir 100 mg in NS 100 mL 100 mg (03/15/20 0843)     LOS: 3 days    Time spent:40 min    Hunt Zajicek, Roselind Messier, MD [facility] Pager [phone]  If 7PM-7AM, please contact night-coverage [email or web address] Password [facility] 03/16/2020, 9:13 AM

## 2020-03-16 NOTE — Progress Notes (Signed)
Physical Therapy Treatment Patient Details Name: Jack Hodges MRN: [id number] DOB: 08/03/1930 Today's Date: 03/16/2020    History of Present Illness Pt is an 86 y.o. male dx with COVID-19 two days prior, now admitted 03/12/20 with generalized weakness; workup for COVID-19 viral PNA. PMH includes CAD s/p CABG, s/p TAVR, CVA, HTN, dementia, depression, recurrent falls; pt unvaccinated.   PT Comments    Pt progressing well with mobility. Tolerated gait training with RW, demonstrating improved stability with this and L AFO donned. SpO2 >/88% on RA (when pleth reliable), minimal DOE noted. Educ pt re: fall risk reduction, strategies for safety at home; unsure pt receptive to this education. Pt declined SNF, therefore recommend HHPT services to maximize functional mobility and independence.   Follow Up Recommendations  Home health PT;Supervision for mobility/OOB (declined SNF)     Equipment Recommendations  3in1 (PT)    Recommendations for Other Services       Precautions / Restrictions Precautions Precautions: Fall;Other (comment) Precaution Comments: L foot drop with AFO in room Required Braces or Orthoses:  (L AFO) Restrictions Weight Bearing Restrictions: No    Mobility  Bed Mobility Overal bed mobility: Independent Bed Mobility: Sit to Supine       Sit to supine: Independent   General bed mobility comments: Indep to return to supine with bed flat  Transfers Overall transfer level: Needs assistance Equipment used: Rolling walker (2 wheeled) Transfers: Sit to/from Stand Sit to Stand: Supervision         General transfer comment: Reliant on momentum to power into standing to RW; supervision for safety/lines  Ambulation/Gait Ambulation/Gait assistance: Min Emergency planning/management officer (Feet): 400 Feet Assistive device: Rolling walker (2 wheeled) Gait Pattern/deviations: Step-through pattern;Decreased stride length;Decreased dorsiflexion - left;Trunk  flexed Gait velocity: Decreased   General Gait Details: Slow, mostly steady gait with RW and intermittent min guard for balance; stability improved with RW and L AFO donned, pt with increased steppage gait due to foot drop   Stairs             Wheelchair Mobility    Modified Rankin (Stroke Patients Only)       Balance Overall balance assessment: History of Falls;Needs assistance Sitting-balance support: Feet supported;No upper extremity supported Sitting balance-Leahy Scale: Good       Standing balance-Leahy Scale: Poor Standing balance comment: Reliant on UE support                            Cognition Arousal/Alertness: Awake/alert Behavior During Therapy: WFL for tasks assessed/performed Overall Cognitive Status: History of cognitive impairments - at baseline Area of Impairment: Safety/judgement;Awareness;Attention                   Current Attention Level: Selective     Safety/Judgement: Decreased awareness of safety;Decreased awareness of deficits Awareness: Emergent   General Comments: Per chart, h/o dementia. Demonstrates decreased safety awareness and poor insight into deficits; likely near baseline cognition      Exercises      General Comments General comments (skin integrity, edema, etc.): Educ pt on use of RW upon return home; pt reports, "I've got things how I need them... I have my cane and I've got furniture to hold onto... my son can help." Educ on recommendation to use walker, as well as keeping phone in his pocket in case of fall      Pertinent Vitals/Pain Pain Assessment: No/denies pain    Home Living  Family/patient expects to be discharged to:: Private residence Living Arrangements: Children Available Help at Discharge: Family;Available PRN/intermittently Type of Home: House Home Access: Stairs to enter Entrance Stairs-Rails: None Home Layout: One level;Laundry or work area in [employer] Equipment: Environmental consultant - 2  wheels;Cane - single point      Prior Function Level of Independence: Independent with assistive device(s)      Comments: pt reports ambulating and performing ADLs independently with use of cane, also reports multiple falls with the last being 2-3 months ago   PT Goals (current goals can now be found in the care plan section) Acute Rehab PT Goals Patient Stated Goal: to go home and mow Progress towards PT goals: Progressing toward goals    Frequency    Min 3X/week      PT Plan Current plan remains appropriate    Co-evaluation              AM-PAC PT "6 Clicks" Mobility   Outcome Measure  Help needed turning from your back to your side while in a flat bed without using bedrails?: None Help needed moving from lying on your back to sitting on the side of a flat bed without using bedrails?: None Help needed moving to and from a bed to a chair (including a wheelchair)?: A Little Help needed standing up from a chair using your arms (e.g., wheelchair or bedside chair)?: A Little Help needed to walk in hospital room?: A Little Help needed climbing 3-5 steps with a railing? : A Lot 6 Click Score: 19    End of Session Equipment Utilized During Treatment: Gait belt Activity Tolerance: Patient tolerated treatment well Patient left: in bed;with call bell/phone within reach;with bed alarm set Nurse Communication: Mobility status PT Visit Diagnosis: Unsteadiness on feet (R26.81);Muscle weakness (generalized) (M62.81);History of falling (Z91.81)     Time: 8916-9450 PT Time Calculation (min) (ACUTE ONLY): 20 min  Charges:  $Gait Training: 8-22 mins                    Ina Homes, PT, DPT Acute Rehabilitation Services  Pager [phone] Office [phone]  Malachy Chamber 03/16/2020, 5:09 PM

## 2020-03-16 NOTE — Progress Notes (Signed)
Ambulated patient on room air. Oxygen sats maintained at 92-93%.

## 2020-03-17 ENCOUNTER — Other Ambulatory Visit (HOSPITAL_COMMUNITY): Payer: Self-pay | Admitting: Internal Medicine

## 2020-03-17 DIAGNOSIS — J9601 Acute respiratory failure with hypoxia: Secondary | ICD-10-CM | POA: Diagnosis present

## 2020-03-17 DIAGNOSIS — A0839 Other viral enteritis: Secondary | ICD-10-CM

## 2020-03-17 DIAGNOSIS — U071 COVID-19: Secondary | ICD-10-CM

## 2020-03-17 HISTORY — DX: COVID-19: U07.1

## 2020-03-17 HISTORY — DX: Other viral enteritis: A08.39

## 2020-03-17 HISTORY — DX: Acute respiratory failure with hypoxia: J96.01

## 2020-03-17 LAB — COMPREHENSIVE METABOLIC PANEL
ALT: 44 U/L (ref 0–44)
AST: 39 U/L (ref 15–41)
Albumin: 2.9 g/dL — ABNORMAL LOW (ref 3.5–5.0)
Alkaline Phosphatase: 51 U/L (ref 38–126)
Anion gap: 8 (ref 5–15)
BUN: 21 mg/dL (ref 8–23)
CO2: 22 mmol/L (ref 22–32)
Calcium: 8.9 mg/dL (ref 8.9–10.3)
Chloride: 106 mmol/L (ref 98–111)
Creatinine, Ser: 0.89 mg/dL (ref 0.61–1.24)
GFR, Estimated: >60 mL/min (ref >60)
Glucose, Bld: 150 mg/dL — ABNORMAL HIGH (ref 70–99)
Potassium: 4 mmol/L (ref 3.5–5.1)
Sodium: 136 mmol/L (ref 135–145)
Total Bilirubin: 0.8 mg/dL (ref 0.3–1.2)
Total Protein: 5.5 g/dL — ABNORMAL LOW (ref 6.5–8.1)

## 2020-03-17 LAB — D-DIMER, QUANTITATIVE: D-Dimer, Quant: 0.52 ug/mL-FEU — ABNORMAL HIGH (ref 0.00–0.50)

## 2020-03-17 LAB — MAGNESIUM: Magnesium: 2.2 mg/dL (ref 1.7–2.4)

## 2020-03-17 LAB — FERRITIN: Ferritin: 459 ng/mL — ABNORMAL HIGH (ref 24–336)

## 2020-03-17 LAB — LACTATE DEHYDROGENASE: LDH: 306 U/L — ABNORMAL HIGH (ref 98–192)

## 2020-03-17 LAB — PHOSPHORUS: Phosphorus: 2.5 mg/dL (ref 2.5–4.6)

## 2020-03-17 LAB — C-REACTIVE PROTEIN: CRP: 0.8 mg/dL (ref <1.0)

## 2020-03-17 MED ORDER — LISINOPRIL 5 MG PO TABS: 5.0000 mg | ORAL_TABLET | Freq: Every day | ORAL | 0 refills | Status: DC

## 2020-03-17 MED ORDER — LOPERAMIDE HCL 2 MG PO CAPS: 2.0000 mg | ORAL_CAPSULE | ORAL | 0 refills | Status: DC | PRN

## 2020-03-17 MED ORDER — VITAMIN D3 25 MCG PO TABS: 1000.0000 [IU] | ORAL_TABLET | Freq: Every day | ORAL | 0 refills | Status: DC

## 2020-03-17 MED ORDER — ZINC SULFATE 220 (50 ZN) MG PO CAPS: 220.0000 mg | ORAL_CAPSULE | Freq: Every day | ORAL | 0 refills | Status: DC

## 2020-03-17 MED ORDER — IPRATROPIUM-ALBUTEROL 20-100 MCG/ACT IN AERS: 1.0000 | INHALATION_SPRAY | Freq: Four times a day (QID) | RESPIRATORY_TRACT | 0 refills | Status: DC

## 2020-03-17 MED ORDER — DEXAMETHASONE 4 MG PO TABS: 6.0000 mg | ORAL_TABLET | Freq: Every day | ORAL | 0 refills | Status: DC

## 2020-03-17 MED ORDER — ASCORBIC ACID 500 MG PO TABS: 500.0000 mg | ORAL_TABLET | Freq: Every day | ORAL | 0 refills | Status: DC

## 2020-03-17 MED ORDER — GUAIFENESIN-DM 100-10 MG/5ML PO SYRP: 10.0000 mL | ORAL_SOLUTION | ORAL | 0 refills | Status: DC | PRN

## 2020-03-17 MED ORDER — ALBUTEROL SULFATE HFA 108 (90 BASE) MCG/ACT IN AERS: 2.0000 | INHALATION_SPRAY | RESPIRATORY_TRACT | 0 refills | Status: DC | PRN

## 2020-03-17 MED ORDER — LISINOPRIL 5 MG PO TABS
5.0000 mg | ORAL_TABLET | Freq: Every day | ORAL | Status: DC
  Administered 2020-03-17: 10:00:00 5 mg via ORAL
  Filled 2020-03-17: qty 1

## 2020-03-17 MED FILL — SM TUSSIN DM SYRUP: 100-10 | 4 days supply | Qty: 236 | Fill #0

## 2020-03-17 MED FILL — LOPERAMIDE 2 MG CAPSULE: 2 | 6 days supply | Qty: 30 | Fill #0

## 2020-03-17 MED FILL — ZINC SULFATE 220 MG TABLET: 220 (50 ZN) | 30 days supply | Qty: 30 | Fill #0

## 2020-03-17 MED FILL — LISINOPRIL 5 MG TABS: 5 | 30 days supply | Qty: 30 | Fill #0

## 2020-03-17 MED FILL — DEXAMETHASONE 6 MG TABLET: 6 | 4 days supply | Qty: 4 | Fill #0

## 2020-03-17 MED FILL — PROAIR HFA 90 MCG INHALER: 108 (90 BAS | 15 days supply | Qty: 9 | Fill #0

## 2020-03-17 MED FILL — VITAMIN C 500 MG TABLET: 500 | 30 days supply | Qty: 30 | Fill #0

## 2020-03-17 MED FILL — VITAMIN D3 25 MCG TABS: 25 | 30 days supply | Qty: 30 | Fill #0

## 2020-03-17 MED FILL — COMBIVENT RESPIMAT INHAL SP: 20-100 | 30 days supply | Qty: 4 | Fill #0

## 2020-03-17 NOTE — Discharge Summary (Addendum)
Physician Discharge Summary  Jack Hodges RDE:[id number] DOB: 1930/04/08 DOA: 03/12/2020  PCP: Jack Miyamoto, MD  Admit date: 03/12/2020 Discharge date: 03/17/2020  Time spent: 35 minutes  Recommendations for Outpatient Follow-up:  Covid vaccination; unvaccinated   Principal Problem:   Pneumonia due to COVID-19 virus Active Problems:   Generalized weakness   Diarrhea   Metabolic acidosis   Thrombocytopenia (HCC)  Acute respiratory failure with hypoxia/SARS COVID-19 viral pneumonia/left lower lobe COVID-19 Labs  Recent Labs    03/15/20 0049 03/16/20 0052 03/17/20 0044  DDIMER 0.97* 0.49 0.52*  FERRITIN 674* 506* 459*  LDH  --  327* 306*  CRP 3.4* 1.4* 0.8   03/11/2020 SARS coronavirus positive outside facility Inflammatory markers slowly trending down -Remdesivir per pharmacy protocol stop date 12/23 -Solu-Medrol 80 mg BID -Combivent QID -Vitamins per Covid protocol -Flutter valve -Incentive spirometry -Out of bed to chair q shift -Place PT/OT consult in the a.m. -12/22 ambulatory SPO2 pending -Patient will be considered contagious until 04/05/2020 patient will need to take basic steps to ensure the safety of himself as well as family members to include isolation, wearing a mask around family members, frequent washing of hands and surface areas with Clorox.  See below for more detail  Covid Gastroenteritis -Most likely secondary to Covid gastroenteritis. -Improved with loperamide.    -Follow up with PT/OT, encourage to be out of bed to chair tid with meals.  AKI with acute anion gap metabolic acidosis -Anion gap metabolic acidosis resolved  Lab Results  Component Value Date   CREATININE 0.89 03/17/2020   CREATININE 0.81 03/16/2020   CREATININE 0.89 03/15/2020   CREATININE 0.79 03/14/2020   CREATININE 0.87 03/13/2020   Essential HTN/CAD -S/p CABG -Unable to start beta-blocker secondary to patient's episodes of bradycardia -Lisinopril 5 mg  daily  Aortic stenosis -S/p TAVR  Mixed anemia -Anemia panel consistent with mixed -Occult blood pending   Leukocytosis -Resolved most likely reactive  Thrombocytopenia -Trending up   Discharge Diagnoses:  Principal Problem:   Pneumonia due to COVID-19 virus Active Problems:   Generalized weakness   Diarrhea   Metabolic acidosis   Thrombocytopenia (HCC)   Discharge Condition: Stable  Diet recommendation: Heart healthy  Filed Weights   03/13/20 0242 03/14/20 1540  Weight: 74.8 kg 74.8 kg    History of present illness:  [name] admitted to the hospital with a working diagnosis of SARS COVID-19 viral pneumonia.  86 year old male with past medical history for coronary artery disease status post bypass grafting, severe aortic stenosis status post TAVR, history of CVA, hypertension, dyslipidemia,dementia and depression. Patient tested for SARS COVID-19 03/11/2020, his symptoms were consistent with fevers, generalized weakness, cough, nausea, vomiting, diarrhea, poor oral intake, and dyspnea. He has not been vaccinated for COVID-19. Due to persistent and worsening symptoms he presented to the hospital. On his initial physical examination he was tachypneic 28 breaths/min, heart rate 99 bpm, blood pressure 153/87, temperature 98.3, oxygen saturation 98%,he had dry mucous membranes, his lungs had no wheezing, heart S1-S2 present, rhythmic, abdomen soft, no lower extremity edema.  Sodium 135, potassium 3.7, chloride 102, bicarb 17, BUN 14, creatinine 1.15, magnesium 1.8, white count 8.5, hemoglobin 12.3, hematocrit 34.8, platelets 89. Urinalysis specific gravity 1.025, 100 protein, 6-10 white cells. Chest radiograph had right base atelectasis and left retrocardiac infiltrate. EKG 81 bpm, rightward axis, right bundle branch block, QTc505, sinus rhythm, no significant ST segment, V1 through V4 T wave versions.  Hospital Course:  See above   Cultures  03/11/2020 SARS coronavirus positive outside facility  Antibiotics Anti-infectives (From admission, onward)   Start     Ordered Stop   03/14/20 1000  remdesivir 100 mg in sodium chloride 0.9 % 100 mL IVPB       "Followed by" Linked Group Details   03/13/20 0401 03/18/20 0959   03/13/20 0500  remdesivir 200 mg in sodium chloride 0.9% 250 mL IVPB       "Followed by" Linked Group Details   03/13/20 0401 03/13/20 0741       Discharge Exam: Vitals:   03/16/20 1910 03/16/20 2008 03/16/20 2055 03/17/20 0457  BP:  (!) 156/71  (!) 154/75  Pulse: 67 72 70 61  Resp:  17  20  Temp:  98.6 F (37 C)  97.8 F (36.6 C)  TempSrc:  Oral  Oral  SpO2: 100% 99% 100% 100%  Weight:      Height:        General: A/O x4, positive acute respiratory distress Eyes: negative scleral hemorrhage, negative anisocoria, negative icterus ENT: Negative Runny nose, negative gingival bleeding, Neck:  Negative scars, masses, torticollis, lymphadenopathy, JVD Lungs: decreased breath sounds bilaterally without wheezes or crackles Cardiovascular: Regular rate and rhythm without murmur gallop or rub normal S1 and S2   Discharge Instructions  Discharge Instructions    Diet - low sodium heart healthy   Complete by: As directed    Discharge instructions   Complete by: As directed    ?   Person Under Monitoring Name: Jack Hodges  Location: [address] [place] [address]   Infection Prevention Recommendations for Individuals Confirmed to have, or Being Evaluated for, 2019 Novel Coronavirus (COVID-19) Infection Who Receive Care at Home  Individuals who are confirmed to have, or are being evaluated for, COVID-19 should follow the prevention steps below until a healthcare provider or local or state health department says they can return to normal activities.  Stay home except to get medical care You should restrict activities outside your home, except for getting medical care. Do not go to work,  school, or public areas, and do not use public transportation or taxis.  Call ahead before visiting your doctor Before your medical appointment, call the healthcare provider and tell them that you have, or are being evaluated for, COVID-19 infection. This will help the healthcare provider's office take steps to keep other people from getting infected. Ask your healthcare provider to call the local or state health department.  Monitor your symptoms Seek prompt medical attention if your illness is worsening (e.g., difficulty breathing). Before going to your medical appointment, call the healthcare provider and tell them that you have, or are being evaluated for, COVID-19 infection. Ask your healthcare provider to call the local or state health department.  Wear a facemask You should wear a facemask that covers your nose and mouth when you are in the same room with other people and when you visit a healthcare provider. People who live with or visit you should also wear a facemask while they are in the same room with you.  Separate yourself from other people in your home As much as possible, you should stay in a different room from other people in your home. Also, you should use a separate bathroom, if available.  Avoid sharing household items You should not share dishes, drinking glasses, cups, eating utensils, towels, bedding, or other items with other people in your home. After using these items, you should wash them thoroughly with  soap and water.  Cover your coughs and sneezes Cover your mouth and nose with a tissue when you cough or sneeze, or you can cough or sneeze into your sleeve. Throw used tissues in a lined trash can, and immediately wash your hands with soap and water for at least 20 seconds or use an alcohol-based hand rub.  Wash your [employer] your hands often and thoroughly with soap and water for at least 20 seconds. You can use an alcohol-based hand sanitizer if soap  and water are not available and if your hands are not visibly dirty. Avoid touching your eyes, nose, and mouth with unwashed hands.   Prevention Steps for Caregivers and Household Members of Individuals Confirmed to have, or Being Evaluated for, COVID-19 Infection Being Cared for in the Home  If you live with, or provide care at home for, a person confirmed to have, or being evaluated for, COVID-19 infection please follow these guidelines to prevent infection:  Follow healthcare provider's instructions Make sure that you understand and can help the patient follow any healthcare provider instructions for all care.  Provide for the patient's basic needs You should help the patient with basic needs in the home and provide support for getting groceries, prescriptions, and other personal needs.  Monitor the patient's symptoms If they are getting sicker, call his or her medical provider and tell them that the patient has, or is being evaluated for, COVID-19 infection. This will help the healthcare provider's office take steps to keep other people from getting infected. Ask the healthcare provider to call the local or state health department.  Limit the number of people who have contact with the patient If possible, have only one caregiver for the patient. Other household members should stay in another home or place of residence. If this is not possible, they should stay in another room, or be separated from the patient as much as possible. Use a separate bathroom, if available. Restrict visitors who do not have an essential need to be in the home.  Keep older adults, very young children, and other sick people away from the patient Keep older adults, very young children, and those who have compromised immune systems or chronic health conditions away from the patient. This includes people with chronic heart, lung, or kidney conditions, diabetes, and cancer.  Ensure good ventilation Make sure  that shared spaces in the home have good air flow, such as from an air conditioner or an opened window, weather permitting.  Wash your hands often Wash your hands often and thoroughly with soap and water for at least 20 seconds. You can use an alcohol based hand sanitizer if soap and water are not available and if your hands are not visibly dirty. Avoid touching your eyes, nose, and mouth with unwashed hands. Use disposable paper towels to dry your hands. If not available, use dedicated cloth towels and replace them when they become wet.  Wear a facemask and gloves Wear a disposable facemask at all times in the room and gloves when you touch or have contact with the patient's blood, body fluids, and/or secretions or excretions, such as sweat, saliva, sputum, nasal mucus, vomit, urine, or feces.  Ensure the mask fits over your nose and mouth tightly, and do not touch it during use. Throw out disposable facemasks and gloves after using them. Do not reuse. Wash your hands immediately after removing your facemask and gloves. If your personal clothing becomes contaminated, carefully remove clothing and launder. Wash  your hands after handling contaminated clothing. Place all used disposable facemasks, gloves, and other waste in a lined container before disposing them with other household waste. Remove gloves and wash your hands immediately after handling these items.  Do not share dishes, glasses, or other household items with the patient Avoid sharing household items. You should not share dishes, drinking glasses, cups, eating utensils, towels, bedding, or other items with a patient who is confirmed to have, or being evaluated for, COVID-19 infection. After the person uses these items, you should wash them thoroughly with soap and water.  Wash laundry thoroughly Immediately remove and wash clothes or bedding that have blood, body fluids, and/or secretions or excretions, such as sweat, saliva, sputum,  nasal mucus, vomit, urine, or feces, on them. Wear gloves when handling laundry from the patient. Read and follow directions on labels of laundry or clothing items and detergent. In general, wash and dry with the warmest temperatures recommended on the label.  Clean all areas the individual has used often Clean all touchable surfaces, such as counters, tabletops, doorknobs, bathroom fixtures, toilets, phones, keyboards, tablets, and bedside tables, every day. Also, clean any surfaces that may have blood, body fluids, and/or secretions or excretions on them. Wear gloves when cleaning surfaces the patient has come in contact with. Use a diluted bleach solution (e.g., dilute bleach with 1 part bleach and 10 parts water) or a household disinfectant with a label that says EPA-registered for coronaviruses. To make a bleach solution at home, add 1 tablespoon of bleach to 1 quart (4 cups) of water. For a larger supply, add  cup of bleach to 1 gallon (16 cups) of water. Read labels of cleaning products and follow recommendations provided on product labels. Labels contain instructions for safe and effective use of the cleaning product including precautions you should take when applying the product, such as wearing gloves or eye protection and making sure you have good ventilation during use of the product. Remove gloves and wash hands immediately after cleaning.  Monitor yourself for signs and symptoms of illness Caregivers and household members are considered close contacts, should monitor their health, and will be asked to limit movement outside of the home to the extent possible. Follow the monitoring steps for close contacts listed on the symptom monitoring form.   ? If you have additional questions, contact your local health department or call the epidemiologist on call at [phone] (available 24/7). ? This guidance is subject to change. For the most up-to-date guidance from [facility], please refer to  their website: [employer]hu   Increase activity slowly   Complete by: As directed      Allergies as of 03/17/2020      Reactions   Poison Oak Extract Rash      Medication List    STOP taking these medications   amLODipine 10 MG tablet Commonly known as: NORVASC   enalapril 20 MG tablet Commonly known as: VASOTEC   furosemide 40 MG tablet Commonly known as: LASIX   potassium chloride 10 MEQ tablet Commonly known as: KLOR-CON     TAKE these medications   acetaminophen 650 MG CR tablet Commonly known as: TYLENOL Take 650 mg by mouth every 8 (eight) hours as needed for pain.   albuterol 108 (90 Base) MCG/ACT inhaler Commonly known as: VENTOLIN HFA Inhale 2 puffs into the lungs every 2 (two) hours as needed for wheezing or shortness of breath.   ascorbic acid 500 MG tablet Commonly known as: VITAMIN C  Take 1 tablet (500 mg total) by mouth daily.   aspirin EC 81 MG tablet Take 81 mg by mouth daily.   clopidogrel 75 MG tablet Commonly known as: PLAVIX TAKE ONE TABLET BY MOUTH ONCE DAILY WITH BREAKFAST What changed:   how much to take  how to take this  when to take this  additional instructions   dexamethasone 4 MG tablet Commonly known as: DECADRON Take 1.5 tablets (6 mg total) by mouth daily.   famotidine 20 MG tablet Commonly known as: PEPCID Take 20 mg by mouth daily.   gabapentin 300 MG capsule Commonly known as: NEURONTIN Take 1 capsule (300 mg total) by mouth 2 (two) times daily.   guaiFENesin-dextromethorphan 100-10 MG/5ML syrup Commonly known as: ROBITUSSIN DM Take 10 mLs by mouth every 4 (four) hours as needed for cough.   Ipratropium-Albuterol 20-100 MCG/ACT Aers respimat Commonly known as: COMBIVENT Inhale 1 puff into the lungs 4 (four) times daily.   lisinopril 5 MG tablet Commonly known as: ZESTRIL Take 1 tablet (5 mg total) by mouth daily.   loperamide 2 MG  capsule Commonly known as: IMODIUM Take 1 capsule (2 mg total) by mouth as needed for diarrhea or loose stools.   meclizine 25 MG tablet Commonly known as: ANTIVERT Take 1 tablet (25 mg total) by mouth 3 (three) times daily as needed for dizziness.   nitroGLYCERIN 0.4 MG SL tablet Commonly known as: Nitrostat Place 1 tablet (0.4 mg total) under the tongue every 5 (five) minutes as needed for chest pain.   pantoprazole 40 MG tablet Commonly known as: PROTONIX TAKE 1 TABLET BY MOUTH  DAILY   rosuvastatin 20 MG tablet Commonly known as: CRESTOR TAKE 1 TABLET BY MOUTH AT  BEDTIME   sertraline 50 MG tablet Commonly known as: ZOLOFT Take 1 tablet (50 mg total) by mouth in the morning.   trolamine salicylate 10 % cream Commonly known as: ASPERCREME Apply 1 application topically daily as needed for muscle pain (Hands).   vitamin B-12 500 MCG tablet Commonly known as: CYANOCOBALAMIN Take 500 mcg by mouth daily.   Vitamin D3 25 MCG tablet Commonly known as: Vitamin D Take 1 tablet (1,000 Units total) by mouth daily.   zinc sulfate 220 (50 Zn) MG capsule Take 1 capsule (220 mg total) by mouth daily.      Allergies  Allergen Reactions  . Poison Oak Extract Rash      The results of significant diagnostics from this hospitalization (including imaging, microbiology, ancillary and laboratory) are listed below for reference.    Significant Diagnostic Studies: DG Chest Portable 1 View  Result Date: 03/12/2020 CLINICAL DATA:  Weakness. The patient tested positive for COVID-19 yesterday. EXAM: PORTABLE CHEST 1 VIEW COMPARISON:  Single-view of the chest 10/27/2019. PA and lateral chest 10/23/2019. FINDINGS: There is retrocardiac airspace opacity obscuring the left hemidiaphragm. Right lung is clear. Heart size is normal. No pneumothorax or pleural effusion. IMPRESSION: Left basilar airspace disease worrisome for pneumonia. Electronically Signed   By: Drusilla Kanner M.D.   On:  03/12/2020 17:59    Microbiology: Recent Results (from the past 240 hour(s))  Blood Culture (routine x 2)     Status: None (Preliminary result)   Collection Time: 03/13/20  5:06 AM   Specimen: BLOOD  Result Value Ref Range Status   Specimen Description BLOOD RIGHT ARM  Final   Special Requests   Final    BOTTLES DRAWN AEROBIC AND ANAEROBIC Blood Culture results may not be optimal  due to an excessive volume of blood received in culture bottles   Culture   Final    NO GROWTH 3 DAYS Performed at [facility], [address] [place], [place] [address]    Report Status PENDING  Incomplete  Blood Culture (routine x 2)     Status: None (Preliminary result)   Collection Time: 03/13/20  5:06 AM   Specimen: BLOOD  Result Value Ref Range Status   Specimen Description BLOOD LEFT ARM  Final   Special Requests   Final    BOTTLES DRAWN AEROBIC ONLY Blood Culture adequate volume   Culture   Final    NO GROWTH 3 DAYS Performed at [facility], [address] [place], [place] [address]    Report Status PENDING  Incomplete     Labs: Basic Metabolic Panel: Recent Labs  Lab 03/13/20 0505 03/13/20 1139 03/14/20 0504 03/15/20 0049 03/16/20 0052 03/17/20 0044  NA  --  139 141 138 139 136  K  --  3.2* 3.9 3.7 4.1 4.0  CL  --  107 108 109 107 106  CO2  --  17* 21* 19* 23 22  GLUCOSE  --  120* 166* 178* 165* 150*  BUN  --  16 17 23  24* 21  CREATININE  --  0.87 0.79 0.89 0.81 0.89  CALCIUM  --  8.7* 9.1 9.0 9.1 8.9  MG 1.8  --   --   --  2.1 2.2  PHOS  --   --   --   --  2.5 2.5   Liver Function Tests: Recent Labs  Lab 03/13/20 0505 03/13/20 1139 03/15/20 0049 03/16/20 0052 03/17/20 0044  AST 49* 53* 39 28 39  ALT 27 26 25 27  44  ALKPHOS 56 64 57 60 51  BILITOT 1.8* 1.1 0.5 0.9 0.8  PROT 6.2* 6.1* 5.7* 5.6* 5.5*  ALBUMIN 3.3* 3.3* 3.0* 3.1* 2.9*   No results for input(s): LIPASE, AMYLASE in the last 168 hours. No results for input(s): AMMONIA in the last 168  hours. CBC: Recent Labs  Lab 03/12/20 1723 03/13/20 1236 03/15/20 0049  WBC 8.5 11.6* 10.1  NEUTROABS  --   --  8.9*  HGB 12.3* 11.8* 11.1*  HCT 34.8* 35.2* 31.4*  MCV 84.1 85.0 84.2  PLT 89* 99* 130*   Cardiac Enzymes: No results for input(s): CKTOTAL, CKMB, CKMBINDEX, TROPONINI in the last 168 hours. BNP: BNP (last 3 results) Recent Labs    06/17/19 1208 10/23/19 0955  BNP 154.2* 108.8*    ProBNP (last 3 results) No results for input(s): PROBNP in the last 8760 hours.  CBG: No results for input(s): GLUCAP in the last 168 hours.     Signed:  Carolyne Littles, MD [facility] [phone] pager

## 2020-03-17 NOTE — Plan of Care (Signed)
  Problem: Safety: Goal: Ability to remain free from injury will improve Outcome: Progressing   Problem: Coping: Goal: Psychosocial and spiritual needs will be supported Outcome: Progressing   Problem: Respiratory: Goal: Will maintain a patent airway Outcome: Progressing

## 2020-03-17 NOTE — TOC Progression Note (Signed)
Transition of Care [facility]) - Progression Note    Patient Details  Name: Jack Hodges MRN: [id number] Date of Birth: 01/19/1931  [facility] CM/SW Contact  Lockie Pares, RN Phone Number: 03/17/2020, 12:34 PM  Clinical Narrative:    Spoke to patient about needs at home and son and daughter have COVID but will be checking on patient. Son will pick him up. He did agree to home health. Previously had [facility]. Called [facility], but they are unable to accept him at this time due to staffing. Will continue seeking HH services.    Expected Discharge Plan: Home w Home Health Services Barriers to Discharge: Continued Medical Work up  Expected Discharge Plan and Services Expected Discharge Plan: Home w Home Health Services In-house Referral: Clinical Social Work Discharge Planning Services: CM Consult Post Acute Care Choice: Home Health Living arrangements for the past 2 months: Single Family Home Expected Discharge Date: 03/17/20                                     Social Determinants of Health (SDOH) Interventions    Readmission Risk Interventions No flowsheet data found.

## 2020-03-17 NOTE — Progress Notes (Signed)
Physical Therapy Treatment Patient Details Name: Jack Hodges MRN: [id number] DOB: 1930/11/22 Today's Date: 03/17/2020    History of Present Illness Pt is an 86 y.o. male dx with COVID-19 two days prior, now admitted 03/12/20 with generalized weakness; workup for COVID-19 viral PNA. PMH includes CAD s/p CABG, s/p TAVR, CVA, HTN, dementia, depression, recurrent falls; pt unvaccinated.   PT Comments    Pt progressing well with mobility. Session focused on additional gait training with RW; pt ambulating with intermittent min guard for balance, cues for safety and RW management. Pt preparing for d/c home today. Continued education on safety and fall risk reduction, pt continues to insist, "I have it set up how I need it... it works just fine." Recommend HHPT services to maximize functional mobility, safety and independence.    Follow Up Recommendations  Home health PT;Supervision for mobility/OOB (declined SNF)     Equipment Recommendations  3in1 (PT)    Recommendations for Other Services       Precautions / Restrictions Precautions Precautions: Fall;Other (comment) Precaution Comments: Bilateral foot drop (has L AFO in room, has R AFO at home) Restrictions Weight Bearing Restrictions: No    Mobility  Bed Mobility Overal bed mobility: Independent Bed Mobility: Supine to Sit              Transfers Overall transfer level: Needs assistance Equipment used: Rolling walker (2 wheeled) Transfers: Sit to/from Stand Sit to Stand: Supervision         General transfer comment: Reliant on momentum to power into standing to RW; supervision for safety/lines  Ambulation/Gait Ambulation/Gait assistance: Min Emergency planning/management officer (Feet): 540 Feet Assistive device: Rolling walker (2 wheeled) Gait Pattern/deviations: Step-through pattern;Decreased stride length;Decreased dorsiflexion - left;Trunk flexed;Decreased dorsiflexion - right Gait velocity: Decreased   General  Gait Details: Slow, mostly steady gait with RW and intermittent min guard for balance; stability improved with RW and L AFO donned, pt with increased steppage gait due to bilateral foot drop; cues to maintain closer proximity to RW and upright posture   Stairs             Wheelchair Mobility    Modified Rankin (Stroke Patients Only)       Balance Overall balance assessment: History of Falls;Needs assistance Sitting-balance support: Feet supported;No upper extremity supported Sitting balance-Leahy Scale: Good     Standing balance support: Single extremity supported Standing balance-Leahy Scale: Fair Standing balance comment: Can static stand without UE support, static and dynamic stability improved with UE support                            Cognition Arousal/Alertness: Awake/alert Behavior During Therapy: WFL for tasks assessed/performed Overall Cognitive Status: History of cognitive impairments - at baseline Area of Impairment: Safety/judgement;Awareness;Attention                   Current Attention Level: Selective     Safety/Judgement: Decreased awareness of safety;Decreased awareness of deficits Awareness: Emergent   General Comments: Per chart, h/o dementia. Demonstrates decreased safety awareness and poor insight into deficits; likely near baseline cognition      Exercises      General Comments        Pertinent Vitals/Pain Pain Assessment: No/denies pain    Home Living                      Prior Function  PT Goals (current goals can now be found in the care plan section) Progress towards PT goals: Progressing toward goals    Frequency    Min 3X/week      PT Plan Current plan remains appropriate    Co-evaluation              AM-PAC PT "6 Clicks" Mobility   Outcome Measure  Help needed turning from your back to your side while in a flat bed without using bedrails?: None Help needed moving  from lying on your back to sitting on the side of a flat bed without using bedrails?: None Help needed moving to and from a bed to a chair (including a wheelchair)?: A Little Help needed standing up from a chair using your arms (e.g., wheelchair or bedside chair)?: A Little Help needed to walk in hospital room?: A Little Help needed climbing 3-5 steps with a railing? : A Lot 6 Click Score: 19    End of Session Equipment Utilized During Treatment: Gait belt Activity Tolerance: Patient tolerated treatment well Patient left: in chair;with call bell/phone within reach Nurse Communication: Mobility status PT Visit Diagnosis: Unsteadiness on feet (R26.81);Muscle weakness (generalized) (M62.81);History of falling (Z91.81)     Time: 1607-3710 PT Time Calculation (min) (ACUTE ONLY): 19 min  Charges:  $Gait Training: 8-22 mins                     Ina Homes, PT, DPT Acute Rehabilitation Services  Pager [phone] Office [phone]  Malachy Chamber 03/17/2020, 10:53 AM

## 2020-03-17 NOTE — Progress Notes (Signed)
SATURATION QUALIFICATIONS: (This note is used to comply with regulatory documentation for home oxygen)  Patient Saturations on Room Air at Rest = 96%  Patient Saturations on Room Air while Ambulating = 92%  Patient does not qualify for home oxygen.

## 2020-03-18 LAB — CULTURE, BLOOD (ROUTINE X 2)
Culture: NO GROWTH
Culture: NO GROWTH
Special Requests: ADEQUATE

## 2020-03-20 ENCOUNTER — Telehealth: Payer: Self-pay

## 2020-03-20 NOTE — Telephone Encounter (Signed)
Called to check on patient and let him know we could not get Home health. Patient states he does not need home health at this time. Encouraged him to follow up with his physician  post hospital

## 2020-03-30 ENCOUNTER — Telehealth: Payer: Self-pay | Admitting: Legal Medicine

## 2020-04-06 ENCOUNTER — Ambulatory Visit (INDEPENDENT_AMBULATORY_CARE_PROVIDER_SITE_OTHER): Payer: Medicare Other | Admitting: Legal Medicine

## 2020-04-06 ENCOUNTER — Other Ambulatory Visit: Payer: Self-pay

## 2020-04-06 ENCOUNTER — Encounter: Payer: Self-pay | Admitting: Legal Medicine

## 2020-04-06 VITALS — BP 140/76 | HR 70 | Temp 97.6°F | Resp 16 | Ht 66.0 in | Wt 166.2 lb

## 2020-04-06 DIAGNOSIS — J1282 Pneumonia due to coronavirus disease 2019: Secondary | ICD-10-CM | POA: Diagnosis not present

## 2020-04-06 DIAGNOSIS — E872 Acidosis, unspecified: Secondary | ICD-10-CM

## 2020-04-06 DIAGNOSIS — R531 Weakness: Secondary | ICD-10-CM

## 2020-04-06 DIAGNOSIS — I10 Essential (primary) hypertension: Secondary | ICD-10-CM | POA: Diagnosis not present

## 2020-04-06 DIAGNOSIS — U071 COVID-19: Secondary | ICD-10-CM

## 2020-04-06 DIAGNOSIS — D696 Thrombocytopenia, unspecified: Secondary | ICD-10-CM | POA: Diagnosis not present

## 2020-04-06 DIAGNOSIS — J9601 Acute respiratory failure with hypoxia: Secondary | ICD-10-CM | POA: Diagnosis not present

## 2020-04-06 DIAGNOSIS — I700 Atherosclerosis of aorta: Secondary | ICD-10-CM

## 2020-04-06 DIAGNOSIS — A0839 Other viral enteritis: Secondary | ICD-10-CM

## 2020-04-06 DIAGNOSIS — I209 Angina pectoris, unspecified: Secondary | ICD-10-CM | POA: Diagnosis not present

## 2020-04-06 DIAGNOSIS — I63139 Cerebral infarction due to embolism of unspecified carotid artery: Secondary | ICD-10-CM | POA: Diagnosis not present

## 2020-04-06 MED ORDER — CLOPIDOGREL BISULFATE 75 MG PO TABS: 75.0000 mg | ORAL_TABLET | Freq: Every evening | ORAL | 2 refills | Status: DC

## 2020-04-06 MED ORDER — LISINOPRIL 5 MG PO TABS: 5.0000 mg | ORAL_TABLET | Freq: Every day | ORAL | 2 refills | Status: DC

## 2020-04-06 MED ORDER — LORAZEPAM 0.5 MG PO TABS: 0.5000 mg | ORAL_TABLET | Freq: Two times a day (BID) | ORAL | 1 refills | Status: DC | PRN

## 2020-04-06 NOTE — Progress Notes (Signed)
Subjective:  Patient ID: Jack Hodges, male    DOB: [birth year]  Age: 87 y.o. MRN: [id number]  Chief Complaint  Patient presents with  . Fatigue    Patient was admitted on 03/12/2020 to 03/17/2020 at [facility].    HPI: post hospital visit.  Patient admitted 03/11/2020 for covid pneumonia, he got O2, remdesivir and and solmendrol. He has gastroenteritis. Discharged on 03/17/2020.  He is tired but no on oxygen.  He can ambulate.   Current Outpatient Medications on File Prior to Visit  Medication Sig Dispense Refill  . acetaminophen (TYLENOL) 650 MG CR tablet Take 650 mg by mouth every 8 (eight) hours as needed for pain.     Marland Kitchen albuterol (VENTOLIN HFA) 108 (90 Base) MCG/ACT inhaler Inhale 2 puffs into the lungs every 2 (two) hours as needed for wheezing or shortness of breath. 6.7 g 0  . ascorbic acid (VITAMIN C) 500 MG tablet Take 1 tablet (500 mg total) by mouth daily. 30 tablet 0  . aspirin EC 81 MG tablet Take 81 mg by mouth daily.    . cholecalciferol (VITAMIN D) 25 MCG (1000 UNIT) tablet TAKE 1 TABLET (1,000 UNITS TOTAL) BY MOUTH DAILY.    . famotidine (PEPCID) 20 MG tablet Take 20 mg by mouth daily.    Marland Kitchen gabapentin (NEURONTIN) 300 MG capsule Take 1 capsule (300 mg total) by mouth 2 (two) times daily. 180 capsule 2  . guaiFENesin-dextromethorphan (ROBITUSSIN DM) 100-10 MG/5ML syrup Take 10 mLs by mouth every 4 (four) hours as needed for cough. 118 mL 0  . Ipratropium-Albuterol (COMBIVENT) 20-100 MCG/ACT AERS respimat Inhale 1 puff into the lungs 4 (four) times daily. 4 g 0  . loperamide (IMODIUM) 2 MG capsule Take 1 capsule (2 mg total) by mouth as needed for diarrhea or loose stools. 30 capsule 0  . meclizine (ANTIVERT) 25 MG tablet Take 1 tablet (25 mg total) by mouth 3 (three) times daily as needed for dizziness. 30 tablet 0  . nitroGLYCERIN (NITROSTAT) 0.4 MG SL tablet Place 1 tablet (0.4 mg total) under the tongue every 5 (five) minutes as needed for chest pain. 25 tablet  2  . pantoprazole (PROTONIX) 40 MG tablet TAKE 1 TABLET BY MOUTH  DAILY 90 tablet 2  . rosuvastatin (CRESTOR) 20 MG tablet TAKE 1 TABLET BY MOUTH AT  BEDTIME (Patient taking differently: Take 20 mg by mouth at bedtime.) 90 tablet 2  . sertraline (ZOLOFT) 50 MG tablet Take 1 tablet (50 mg total) by mouth in the morning. 90 tablet 1  . trolamine salicylate (ASPERCREME) 10 % cream Apply 1 application topically daily as needed for muscle pain (Hands).     . vitamin B-12 (CYANOCOBALAMIN) 500 MCG tablet Take 500 mcg by mouth daily.    Marland Kitchen zinc sulfate 220 (50 Zn) MG capsule Take 1 capsule (220 mg total) by mouth daily. 30 capsule 0   No current facility-administered medications on file prior to visit.   Past Medical History:  Diagnosis Date  . Arthritis   . Bilateral foot-drop 01/18/2017  . Cellulitis of left hand 07/07/2019  . Coronary artery disease involving native coronary artery of native heart without angina pectoris 11/01/2015   Overview:  Bypass surgery in 1996 Overview:  Overview:  Bypass surgery in 1996  Last Assessment & Plan:  Continue his current regimen.  Follow up as noted.  . CVA (cerebral vascular accident) (HCC) 04/03/2016   Last Assessment & Plan:  The patient underwent extensive CVA  workup.  MRI is negative.  He does have aortic stenosis by echocardiogram.  Upon review of Care everywhere he is followed by Dr. Bing Matter, cardiologist for this.  Carotid ultrasound negative for hemodynamically significant stenosis.  Lipid panel within normal limits.  He has not had any further episodes of dizziness or nausea vomiting.   . Dementia (HCC)   . Depression   . Dizziness 04/19/2016  . DNR (do not resuscitate) 04/03/2016   Overview:  I had an extensive discussion with the patient on 04/03/2016 regarding his end of life wishes.  He and his daughter agree that do not resuscitate is in keeping with his beliefs.  . Dyslipidemia 11/01/2015   Last Assessment & Plan:  Formatting of this note might be  different from the original. Cholesterol 136   Triglycerides  83  HDL  50.1  LDL Calculated  69  VLDL Cholesterol Cal  17   Continue statin.  Marland Kitchen Dyspnea    with activity  . Essential hypertension 11/01/2015   Last Assessment & Plan:  Resume home medications.  Follow-up as noted above.  Marland Kitchen GERD (gastroesophageal reflux disease)   . Herpes zoster 05/19/2019  . Myocardial infarction (HCC)    approx 1994  . Neuropathy   . Other chest pain   . Recurrent falls 06/25/2019  . 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 Severe aortic stenosis   . Sleep apnea   . 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   Past Surgical History:  Procedure Laterality Date  . CARDIAC CATHETERIZATION    . CARDIAC SURGERY    . CATARACT EXTRACTION, BILATERAL    . CORONARY ARTERY BYPASS GRAFT    . CORONARY STENT INTERVENTION N/A 10/07/2019   Procedure: CORONARY STENT INTERVENTION;  Surgeon: Tonny Bollman, MD;  Location[facility]VAS[facility] CV LAB;  Service: Cardiovascular;  Laterality: N/A;  . EYE SURGERY    . HEMORROIDECTOMY    . RIGHT/LEFT HEART CATH AND CORONARY/GRAFT ANGIOGRAPHY N/A 10/07/2019   Procedure: RIGHT/LEFT HEART CATH AND CORONARY/GRAFT ANGIOGRAPHY;  Surgeon: Tonny Bollman, MD;  Location: [facility] INVASIVE CV LAB;  Service: Cardiovascular;  Laterality: N/A;  . TEE WITHOUT CARDIOVERSION N/A 10/27/2019   Procedure: TRANSESOPHAGEAL ECHOCARDIOGRAM (TEE);  Surgeon: Kathleene Hazel, MD;  Location: [facility] INVASIVE CV LAB;  Service: Open Heart Surgery;  Laterality: N/A;  . TRANSCATHETER AORTIC VALVE REPLACEMENT, TRANSFEMORAL N/A 10/27/2019   Procedure: TRANSCATHETER AORTIC VALVE REPLACEMENT, TRANSFEMORAL;  Surgeon: Kathleene Hazel, MD;  Location: [facility] INVASIVE CV LAB;   Service: Open Heart Surgery;  Laterality: N/A;    Family History  Problem Relation Age of Onset  . Cancer Mother   . Diabetes Mother   . Kidney disease Mother   . Stroke Mother   . Sleep disorder Mother   . Arthritis Father   . Migraines Father   . Hypertension Father   . Sleep disorder Father   . Arthritis Brother   . Cancer Brother    Social History   Socioeconomic History  . Marital status: Widowed    Spouse name: Not on file  . Number of children: 2  . Years of education: Not on file  . Highest education level: Not on file  Occupational History  . Occupation: retired  Tobacco Use  . Smoking  status: Former Smoker    Types: Cigarettes, Cigars    Quit date: 05/02/1962    Years since quitting: 57.9  . Smokeless tobacco: Never Used  Vaping Use  . Vaping Use: Never used  Substance and Sexual Activity  . Alcohol use: No  . Drug use: No  . Sexual activity: Not Currently  Other Topics Concern  . Not on file  Social History Narrative  . Not on file   Social Determinants of Health   Financial Resource Strain: Not on file  Food Insecurity: No Food Insecurity  . Worried About Programme researcher, broadcasting/film/video in the Last Year: Never true  . Ran Out of Food in the Last Year: Never true  Transportation Needs: No Transportation Needs  . Lack of Transportation (Medical): No  . Lack of Transportation (Non-Medical): No  Physical Activity: Not on file  Stress: Not on file  Social Connections: Not on file    Review of Systems  Constitutional: Positive for activity change and fatigue.  HENT: Negative for congestion and sinus pain.   Eyes: Negative for visual disturbance.  Respiratory: Negative for choking, chest tightness and shortness of breath.   Cardiovascular: Negative for chest pain, palpitations and leg swelling.  Gastrointestinal: Negative for abdominal distention and abdominal pain.  Endocrine: Positive for polyuria.  Genitourinary: Negative for difficulty urinating, dysuria  and urgency.  Musculoskeletal: Negative for arthralgias and back pain.  Skin: Negative.   Neurological: Negative for dizziness.  Psychiatric/Behavioral: Negative.      Objective:  BP 140/76 (BP Location: Left Arm, Patient Position: Sitting, Cuff Size: Normal)   Pulse 70   Temp 97.6 F (36.4 C) (Temporal)   Resp 16   Ht 5\' 6"  (1.676 m)   Wt 166 lb 3.2 oz (75.4 kg)   SpO2 97%   BMI 26.83 kg/m   BP/Weight 04/06/2020 03/17/2020 03/14/2020  Systolic BP 140 154 -  Diastolic BP 76 75 -  Wt. (Lbs) 166.2 - 164.9  BMI 26.83 - 26.62    Physical Exam Vitals reviewed.  Constitutional:      General: He is not in acute distress.    Appearance: Normal appearance.  HENT:     Right Ear: Tympanic membrane, ear canal and external ear normal.     Left Ear: Tympanic membrane, ear canal and external ear normal.  Eyes:     Extraocular Movements: Extraocular movements intact.     Conjunctiva/sclera: Conjunctivae normal.     Pupils: Pupils are equal, round, and reactive to light.  Cardiovascular:     Rate and Rhythm: Normal rate and regular rhythm.     Pulses: Normal pulses.     Heart sounds: Normal heart sounds. No murmur heard. No gallop.   Pulmonary:     Effort: Pulmonary effort is normal. No respiratory distress.     Breath sounds: Normal breath sounds. No wheezing.  Abdominal:     General: Abdomen is flat. Bowel sounds are normal. There is no distension.     Palpations: Abdomen is soft.     Tenderness: There is no abdominal tenderness.  Musculoskeletal:        General: Normal range of motion.  Skin:    General: Skin is warm and dry.     Capillary Refill: Capillary refill takes less than 2 seconds.  Neurological:     General: No focal deficit present.     Mental Status: He is alert and oriented to person, place, and time.      Lab Results  Component Value Date   WBC 10.1 03/15/2020   HGB 11.1 (L) 03/15/2020   HCT 31.4 (L) 03/15/2020   PLT 130 (L) 03/15/2020   GLUCOSE 150  (H) 03/17/2020   CHOL 127 09/24/2019   TRIG 106 03/13/2020   HDL 35 (L) 09/24/2019   LDLCALC 67 09/24/2019   ALT 44 03/17/2020   AST 39 03/17/2020   NA 136 03/17/2020   K 4.0 03/17/2020   CL 106 03/17/2020   CREATININE 0.89 03/17/2020   BUN 21 03/17/2020   CO2 22 03/17/2020   INR 1.1 10/23/2019   HGBA1C 4.8 10/23/2019      Assessment & Plan:   1. Pneumonia due to COVID-19 virus Patient hade Covid pneumonia and remains fatigued.  No fever.  2. Generalized weakness He has been weak for a long time from medical problems and disuse atrophy  3. Metabolic acidosis Resolved in hospital with fluids  4. Gastroenteritis due to COVID-19 virus The covid GE has stopped and no diarrhea for 2 weeks  5. Atherosclerosis of aorta (HCC) Found on CT abdomen  6. Acute respiratory failure with hypoxia (HCC) Patien tis on oxygen from his covid pneumonia  7. Thrombocytopenia, unspecified (HCC) Patient has chronic essential thrombocytopenia  8. Angina pectoris [facility](HCC) An individual plan was formulated based on patient history and exam, labs and evidence based data. Patient has not had recent angina or nitroglycerin use. continue present treatment.  9. 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  Dispense: 90 tablet; Refill: 2 Patient has recovered from stroke  10. Essential hypertension - lisinopril (ZESTRIL) 5 MG tablet; Take 1 tablet (5 mg total) by mouth daily.  Dispense: 90 tablet; Refill: 2 An individual hypertension care plan was established and reinforced today.  The patient's status was assessed using clinical findings on exam and labs or diagnostic tests. The patient's success at meeting treatment goals on disease specific evidence-based guidelines and found to be well controlled. SELF MANAGEMENT: The patient and I together assessed  ways to personally work towards obtaining the recommended goals. RECOMMENDATIONS: avoid decongestants found in common cold remedies, decrease consumption of alcohol, perform routine monitoring of BP with home BP cuff, exercise, reduction of dietary salt, take medicines as prescribed, try not to miss doses and quit smoking.  Regular exercise and maintaining a healthy weight is needed.  Stress reduction may help. A CLINICAL SUMMARY including written plan identify barriers to care unique to individual due to social or financial issues.  We attempt to mutually creat solutions for individual and family understanding.    Meds ordered this encounter  Medications  . clopidogrel (PLAVIX) 75 MG tablet    Sig: Take 1 tablet (75 mg total) by mouth at bedtime.    Dispense:  90 tablet    Refill:  2    This prescription was filled on 12/09/2019. Any refills authorized will be placed on file.  Marland Kitchen. lisinopril (ZESTRIL) 5 MG tablet    Sig: Take 1 tablet (5 mg total) by mouth daily.    Dispense:  90 tablet    Refill:  2  . LORazepam (ATIVAN) 0.5 MG tablet    Sig: Take 1 tablet (0.5 mg total) by mouth 2 (two) times daily as needed for anxiety.    Dispense:  30 tablet    Refill:  1    No orders of the defined types  were placed in this encounter.     I spent 30 minutes dedicated to the care of this patient on the date of this encounter to include face-to-face time with the patient, as well as: extensive discussion with patient who has hearing deficit.  He says he now understand his medicine.  Follow-up: Return in about 1 month (around 05/07/2020) for chronic visit fasting.  An After Visit Summary was printed and given to the patient.  Brent Bulla, MD [facility] [phone]

## 2020-04-26 ENCOUNTER — Telehealth: Payer: Self-pay

## 2020-04-26 NOTE — Progress Notes (Signed)
Chronic Care Management Pharmacy Assistant   Name: Jack Hodges  MRN: [id number] DOB: 11-Jan-1931  Reason for Encounter: Disease State call for general adherence  Patient Questions:  1.  Have you seen any other providers since your last visit? Yes, 04/06/20-PCP-Covid pneumonia follow up 03/12/20-ED admittance for Covid pneumonia   2.  Any changes in your medicines or health? Yes, Patient stated he has some weakness in his legs. .Atherosclerosis of aorta (HCC) Found on CT abdomen       PCP : Abigail Miyamoto, MD  Allergies:   Allergies  Allergen Reactions  . Poison Oak Extract Rash    Medications: Outpatient Encounter Medications as of 04/26/2020  Medication Sig  . acetaminophen (TYLENOL) 650 MG CR tablet Take 650 mg by mouth every 8 (eight) hours as needed for pain.   Marland Kitchen albuterol (VENTOLIN HFA) 108 (90 Base) MCG/ACT inhaler Inhale 2 puffs into the lungs every 2 (two) hours as needed for wheezing or shortness of breath.  Marland Kitchen ascorbic acid (VITAMIN C) 500 MG tablet Take 1 tablet (500 mg total) by mouth daily.  Marland Kitchen aspirin EC 81 MG tablet Take 81 mg by mouth daily.  . cholecalciferol (VITAMIN D) 25 MCG (1000 UNIT) tablet TAKE 1 TABLET (1,000 UNITS TOTAL) BY MOUTH DAILY.  Marland Kitchen clopidogrel (PLAVIX) 75 MG tablet Take 1 tablet (75 mg total) by mouth at bedtime.  . famotidine (PEPCID) 20 MG tablet Take 20 mg by mouth daily.  Marland Kitchen gabapentin (NEURONTIN) 300 MG capsule Take 1 capsule (300 mg total) by mouth 2 (two) times daily.  Marland Kitchen guaiFENesin-dextromethorphan (ROBITUSSIN DM) 100-10 MG/5ML syrup Take 10 mLs by mouth every 4 (four) hours as needed for cough.  . Ipratropium-Albuterol (COMBIVENT) 20-100 MCG/ACT AERS respimat Inhale 1 puff into the lungs 4 (four) times daily.  Marland Kitchen lisinopril (ZESTRIL) 5 MG tablet Take 1 tablet (5 mg total) by mouth daily.  Marland Kitchen loperamide (IMODIUM) 2 MG capsule Take 1 capsule (2 mg total) by mouth as needed for diarrhea or loose stools.  Marland Kitchen LORazepam (ATIVAN) 0.5 MG  tablet Take 1 tablet (0.5 mg total) by mouth 2 (two) times daily as needed for anxiety.  . meclizine (ANTIVERT) 25 MG tablet Take 1 tablet (25 mg total) by mouth 3 (three) times daily as needed for dizziness.  . nitroGLYCERIN (NITROSTAT) 0.4 MG SL tablet Place 1 tablet (0.4 mg total) under the tongue every 5 (five) minutes as needed for chest pain.  . pantoprazole (PROTONIX) 40 MG tablet TAKE 1 TABLET BY MOUTH  DAILY  . rosuvastatin (CRESTOR) 20 MG tablet TAKE 1 TABLET BY MOUTH AT  BEDTIME (Patient taking differently: Take 20 mg by mouth at bedtime.)  . sertraline (ZOLOFT) 50 MG tablet Take 1 tablet (50 mg total) by mouth in the morning.  . trolamine salicylate (ASPERCREME) 10 % cream Apply 1 application topically daily as needed for muscle pain (Hands).   . vitamin B-12 (CYANOCOBALAMIN) 500 MCG tablet Take 500 mcg by mouth daily.  Marland Kitchen zinc sulfate 220 (50 Zn) MG capsule Take 1 capsule (220 mg total) by mouth daily.   No facility-administered encounter medications on file as of 04/26/2020.    Current Diagnosis: Patient Active Problem List   Diagnosis Date Noted  . Acute respiratory failure with hypoxia (HCC) 03/17/2020  . Gastroenteritis due to COVID-19 virus 03/17/2020  . Pneumonia due to COVID-19 virus 03/13/2020  . Generalized weakness 03/13/2020  . Diarrhea 03/13/2020  . Metabolic acidosis 03/13/2020  . Thrombocytopenia (HCC) 03/13/2020  .  History of aortic stenosis 03/11/2020  . Acute bursitis of left shoulder 01/04/2020  . 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 Angina pectoris (HCC) 10/07/2019  . Atherosclerosis of aorta (HCC) 09/24/2019  . BMI 27.0-27.9,adult 09/24/2019  . Cellulitis of left hand 07/07/2019  . Recurrent falls 06/25/2019  . Other chest pain   . Herpes zoster 05/19/2019  . Dyspnea on exertion 06/30/2018  . Bilateral foot-drop 01/18/2017  . 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 Dizziness 04/19/2016  .  CVA (cerebral vascular accident) (HCC) 04/03/2016  . DNR (do not resuscitate) 04/03/2016  . Coronary artery disease involving native coronary artery of native heart without angina pectoris 11/01/2015  . Dyslipidemia 11/01/2015  . Essential hypertension 11/01/2015  . Status post coronary artery bypass graft 11/01/2015   Patient stated he is doing much better since having Covid pneumonia back in Dec. 2021.  He was hospitalized for several days and received medication, he was not on oxygen.   Patient stated that sometimes in the morning when he takes his blood pressure it is in the 150 range but comes down after a bit.  He stated that his blood pressure today was 119/71.  Patient stated he has weakness in his legs, so his activity level is not what he would like, but he said he gets around ok.    Patient stated he was out of his Sertraline and asked if I would call to get him a refill sent.  He gets his medication through mail order.  I told him I would call his insurance and see what I can do.  Called [facility] and they are sending his Sertraline to him.   Patient stated he has no issues with any of his medication, no side effects noted.   Follow-Up:  Pharmacist Review  Lucia Gaskins CPP notified  Leilani Able, [facility] Clinical Pharmacist Assistant [phone]

## 2020-05-09 ENCOUNTER — Ambulatory Visit (INDEPENDENT_AMBULATORY_CARE_PROVIDER_SITE_OTHER): Payer: Medicare Other | Admitting: Legal Medicine

## 2020-05-09 ENCOUNTER — Encounter: Payer: Self-pay | Admitting: Legal Medicine

## 2020-05-09 ENCOUNTER — Other Ambulatory Visit: Payer: Self-pay

## 2020-05-09 VITALS — BP 148/76 | HR 66 | Resp 18 | Ht 65.0 in | Wt 169.6 lb

## 2020-05-09 DIAGNOSIS — E785 Hyperlipidemia, unspecified: Secondary | ICD-10-CM

## 2020-05-09 DIAGNOSIS — I2510 Atherosclerotic heart disease of native coronary artery without angina pectoris: Secondary | ICD-10-CM

## 2020-05-09 DIAGNOSIS — I10 Essential (primary) hypertension: Secondary | ICD-10-CM

## 2020-05-09 MED ORDER — LORAZEPAM 0.5 MG PO TABS: 0.5000 mg | ORAL_TABLET | Freq: Two times a day (BID) | ORAL | 1 refills | Status: DC | PRN

## 2020-05-09 NOTE — Progress Notes (Signed)
Subjective:  Patient ID: Jack Hodges, male    DOB: 12-14-1930  Age: 87 y.o. MRN: [id number]  Chief Complaint  Patient presents with  . Hypertension    High home BP readings    HPI: Chronic visit  Patient presents for follow up of hypertension.  Patient tolerating lisinopril well with side effects.  Patient was diagnosed with hypertension 2010 so has been treated for hypertension for 10 years.Patient is working on maintaining diet and exercise regimen and follows up as directed. Complication include cad  CORONARY ARTERY DISEASE  Patient presents in follow up of CAD. Patient was diagnosed in 40. The patient has no associated CHF. The patient is currently taking a beta blocker, statin, and aspirin. CAD was diagnosed 26 years ago.  Patient is having no angina. Patient has used no NTG.  Patient is followed by cardiology.  Patient had CABG . Last angiography was na, last echocardiogram na..   Current Outpatient Medications on File Prior to Visit  Medication Sig Dispense Refill  . acetaminophen (TYLENOL) 650 MG CR tablet Take 650 mg by mouth every 8 (eight) hours as needed for pain.     Marland Kitchen albuterol (VENTOLIN HFA) 108 (90 Base) MCG/ACT inhaler Inhale 2 puffs into the lungs every 2 (two) hours as needed for wheezing or shortness of breath. 6.7 g 0  . ascorbic acid (VITAMIN C) 500 MG tablet Take 1 tablet (500 mg total) by mouth daily. 30 tablet 0  . aspirin EC 81 MG tablet Take 81 mg by mouth daily.    . cholecalciferol (VITAMIN D) 25 MCG (1000 UNIT) tablet TAKE 1 TABLET (1,000 UNITS TOTAL) BY MOUTH DAILY.    Marland Kitchen clopidogrel (PLAVIX) 75 MG tablet Take 1 tablet (75 mg total) by mouth at bedtime. 90 tablet 2  . famotidine (PEPCID) 20 MG tablet Take 20 mg by mouth daily.    Marland Kitchen gabapentin (NEURONTIN) 300 MG capsule Take 1 capsule (300 mg total) by mouth 2 (two) times daily. 180 capsule 2  . guaiFENesin-dextromethorphan (ROBITUSSIN DM) 100-10 MG/5ML syrup Take 10 mLs by mouth every 4 (four) hours as  needed for cough. 118 mL 0  . Ipratropium-Albuterol (COMBIVENT) 20-100 MCG/ACT AERS respimat Inhale 1 puff into the lungs 4 (four) times daily. 4 g 0  . lisinopril (ZESTRIL) 5 MG tablet Take 1 tablet (5 mg total) by mouth daily. 90 tablet 2  . loperamide (IMODIUM) 2 MG capsule Take 1 capsule (2 mg total) by mouth as needed for diarrhea or loose stools. 30 capsule 0  . LORazepam (ATIVAN) 0.5 MG tablet Take 1 tablet (0.5 mg total) by mouth 2 (two) times daily as needed for anxiety. 30 tablet 1  . meclizine (ANTIVERT) 25 MG tablet Take 1 tablet (25 mg total) by mouth 3 (three) times daily as needed for dizziness. 30 tablet 0  . nitroGLYCERIN (NITROSTAT) 0.4 MG SL tablet Place 1 tablet (0.4 mg total) under the tongue every 5 (five) minutes as needed for chest pain. 25 tablet 2  . pantoprazole (PROTONIX) 40 MG tablet TAKE 1 TABLET BY MOUTH  DAILY 90 tablet 2  . rosuvastatin (CRESTOR) 20 MG tablet TAKE 1 TABLET BY MOUTH AT  BEDTIME (Patient taking differently: Take 20 mg by mouth at bedtime.) 90 tablet 2  . sertraline (ZOLOFT) 50 MG tablet Take 1 tablet (50 mg total) by mouth in the morning. 90 tablet 1  . trolamine salicylate (ASPERCREME) 10 % cream Apply 1 application topically daily as needed for muscle pain (Hands).     Marland Kitchen  vitamin B-12 (CYANOCOBALAMIN) 500 MCG tablet Take 500 mcg by mouth daily.    Marland Kitchen. zinc sulfate 220 (50 Zn) MG capsule Take 1 capsule (220 mg total) by mouth daily. 30 capsule 0   No current facility-administered medications on file prior to visit.   Past Medical History:  Diagnosis Date  . Arthritis   . Bilateral foot-drop 01/18/2017  . Cellulitis of left hand 07/07/2019  . Coronary artery disease involving native coronary artery of native heart without angina pectoris 11/01/2015   Overview:  Bypass surgery in 1996 Overview:  Overview:  Bypass surgery in 1996  Last Assessment & Plan:  Continue his current regimen.  Follow up as noted.  . CVA (cerebral vascular accident) (HCC)  04/03/2016   Last Assessment & Plan:  The patient underwent extensive CVA workup.  MRI is negative.  He does have aortic stenosis by echocardiogram.  Upon review of Care everywhere he is followed by Dr. Bing MatterKrasowski, cardiologist for this.  Carotid ultrasound negative for hemodynamically significant stenosis.  Lipid panel within normal limits.  He has not had any further episodes of dizziness or nausea vomiting.   . Dementia (HCC)   . Depression   . Dizziness 04/19/2016  . DNR (do not resuscitate) 04/03/2016   Overview:  I had an extensive discussion with the patient on 04/03/2016 regarding his end of life wishes.  He and his daughter agree that do not resuscitate is in keeping with his beliefs.  . Dyslipidemia 11/01/2015   Last Assessment & Plan:  Formatting of this note might be different from the original. Cholesterol 136   Triglycerides  83  HDL  50.1  LDL Calculated  69  VLDL Cholesterol Cal  17   Continue statin.  Marland Kitchen. Dyspnea    with activity  . Essential hypertension 11/01/2015   Last Assessment & Plan:  Resume home medications.  Follow-up as noted above.  Marland Kitchen. GERD (gastroesophageal reflux disease)   . Herpes zoster 05/19/2019  . Myocardial infarction (HCC)    approx 1994  . Neuropathy   . Other chest pain   . Recurrent falls 06/25/2019  . 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 Severe aortic stenosis   . Sleep apnea   . 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   Past Surgical History:  Procedure Laterality Date  . CARDIAC CATHETERIZATION    . CARDIAC SURGERY    . CATARACT EXTRACTION, BILATERAL    . CORONARY ARTERY BYPASS GRAFT    . CORONARY STENT INTERVENTION N/A 10/07/2019   Procedure: CORONARY STENT INTERVENTION;  Surgeon: Tonny Bollmanooper,  Michael, MD;  [facility];  Service: Cardiovascular;  Laterality: N/A;  . EYE SURGERY    . HEMORROIDECTOMY    . RIGHT/LEFT HEART CATH AND CORONARY/GRAFT ANGIOGRAPHY N/A 10/07/2019   Procedure: RIGHT/LEFT HEART CATH AND CORONARY/GRAFT ANGIOGRAPHY;  Surgeon: Tonny Bollmanooper, Michael, MD;  [facility];  Service: Cardiovascular;  Laterality: N/A;  . TEE WITHOUT CARDIOVERSION N/A 10/27/2019   Procedure: TRANSESOPHAGEAL ECHOCARDIOGRAM (TEE);  Surgeon: Kathleene HazelMcAlhany, Christopher D, MD;  [facility];  Service: Open Heart Surgery;  Laterality: N/A;  . TRANSCATHETER AORTIC VALVE REPLACEMENT, TRANSFEMORAL N/A 10/27/2019   Procedure: TRANSCATHETER AORTIC VALVE REPLACEMENT, TRANSFEMORAL;  Surgeon: Kathleene HazelMcAlhany, Christopher D, MD;  [facility];  Service: Open Heart Surgery;  Laterality: N/A;    Family History  Problem Relation Age of Onset  . Cancer Mother   . Diabetes Mother   . Kidney disease Mother   . Stroke Mother   . Sleep disorder Mother   . Arthritis Father   . Migraines Father   . Hypertension Father   . Sleep disorder Father   . Arthritis Brother   . Cancer Brother    Social History   Socioeconomic History  . Marital status: Widowed    Spouse name: Not on file  . Number of children: 2  . Years of education: Not on file  . Highest education level: Not on file  Occupational History  . Occupation: retired  Tobacco Use  . Smoking status: Former Smoker    Types: Cigarettes, Cigars    Quit date: 05/02/1962    Years since quitting: 58.0  . Smokeless tobacco: Never Used  Vaping Use  . Vaping Use: Never used  Substance and Sexual Activity  . Alcohol use: No  . Drug use: No  . Sexual activity: Not Currently  Other Topics Concern  . Not on file  Social History Narrative  . Not on file   Social Determinants of Health   Financial Resource Strain: Not on file  Food Insecurity: No Food Insecurity  . Worried About Programme researcher, broadcasting/film/video in  the Last Year: Never true  . Ran Out of Food in the Last Year: Never true  Transportation Needs: No Transportation Needs  . Lack of Transportation (Medical): No  . Lack of Transportation (Non-Medical): No  Physical Activity: Not on file  Stress: Not on file  Social Connections: Not on file    Review of Systems  Constitutional: Negative for activity change, appetite change and unexpected weight change.  HENT: Negative for congestion and sinus pain.   Eyes: Negative for visual disturbance.  Respiratory: Positive for shortness of breath. Negative for chest tightness.   Cardiovascular: Negative for chest pain, palpitations and leg swelling.  Gastrointestinal: Negative.  Negative for abdominal distention and abdominal pain.  Endocrine: Negative for polyuria.  Genitourinary: Negative for difficulty urinating, dysuria and urgency.  Musculoskeletal: Negative for arthralgias and back pain.  Neurological: Positive for weakness (legs).  Psychiatric/Behavioral: Negative.      Objective:  BP (!) 148/76   Pulse 66   Resp 18   Ht 5\' 5"  (1.651 m)   Wt 169 lb 9.6 oz (76.9 kg)   SpO2 98%   BMI 28.22 kg/m   BP/Weight 05/09/2020 04/06/2020 03/17/2020  Systolic BP 148 140 154  Diastolic BP 76 76 75  Wt. (Lbs) 169.6 166.2 -  BMI 28.22 26.83 -    Physical Exam Constitutional:      Appearance: Normal appearance.  HENT:     Head: Normocephalic and atraumatic.     Right Ear: Tympanic membrane, ear canal and external ear normal.     Left Ear: Ear canal and external ear normal.     Mouth/Throat:     Mouth: Mucous membranes are moist.     Pharynx: Oropharynx is clear.  Eyes:     Extraocular Movements: Extraocular movements intact.     Conjunctiva/sclera: Conjunctivae normal.     Pupils: Pupils are equal, round, and reactive to light.  Cardiovascular:     Rate and Rhythm: Normal rate and regular rhythm.     Pulses: Normal pulses.     Heart sounds: Normal heart sounds. No  murmur heard. No  gallop.   Pulmonary:     Effort: No respiratory distress.     Breath sounds: No rales.  Abdominal:     General: Abdomen is flat. Bowel sounds are normal. There is no distension.     Palpations: Abdomen is soft.     Tenderness: There is no abdominal tenderness.  Musculoskeletal:     Cervical back: Normal range of motion and neck supple.  Skin:    General: Skin is warm.     Capillary Refill: Capillary refill takes less than 2 seconds.  Neurological:     General: No focal deficit present.     Mental Status: He is alert and oriented to person, place, and time. Mental status is at baseline.     Gait: Gait abnormal.     Comments: Unstable rhomberg  Psychiatric:        Mood and Affect: Mood normal.        Thought Content: Thought content normal.        Judgment: Judgment normal.       Lab Results  Component Value Date   WBC 10.1 03/15/2020   HGB 11.1 (L) 03/15/2020   HCT 31.4 (L) 03/15/2020   PLT 130 (L) 03/15/2020   GLUCOSE 150 (H) 03/17/2020   CHOL 127 09/24/2019   TRIG 106 03/13/2020   HDL 35 (L) 09/24/2019   LDLCALC 67 09/24/2019   ALT 44 03/17/2020   AST 39 03/17/2020   NA 136 03/17/2020   K 4.0 03/17/2020   CL 106 03/17/2020   CREATININE 0.89 03/17/2020   BUN 21 03/17/2020   CO2 22 03/17/2020   INR 1.1 10/23/2019   HGBA1C 4.8 10/23/2019      Assessment & Plan:   Diagnoses and all orders for this visit: Coronary artery disease involving native coronary artery of native heart without angina pectoris Patient's CAD was assessed using history and physical along with other information to maximize treatment.  Evidence based criteria was use in deciding proper management for this disease process.  Patient's CAD is under good control.therapy continue on present treatment.  Essential hypertension -     CBC with Differential/Platelet -     Comprehensive metabolic panel An individual hypertension care plan was established and reinforced today.  The patient's status was  assessed using clinical findings on exam and labs or diagnostic tests. The patient's success at meeting treatment goals on disease specific evidence-based guidelines and found to be well controlled. SELF MANAGEMENT: The patient and I together assessed ways to personally work towards obtaining the recommended goals. RECOMMENDATIONS: avoid decongestants found in common cold remedies, decrease consumption of alcohol, perform routine monitoring of BP with home BP cuff, exercise, reduction of dietary salt, take medicines as prescribed, try not to miss doses and quit smoking.  Regular exercise and maintaining a healthy weight is needed.  Stress reduction may help. A CLINICAL SUMMARY including written plan identify barriers to care unique to individual due to social or financial issues.  We attempt to mutually creat solutions for individual and family understanding.  Dyslipidemia -     Lipid panel AN INDIVIDUAL CARE PLAN for hyperlipidemia/ cholesterol was established and reinforced today.  The patient's status was assessed using clinical findings on exam, lab and other diagnostic tests. The patient's disease status was assessed based on evidence-based guidelines and found to be well controlled. MEDICATIONS were reviewed. SELF MANAGEMENT GOALS have been discussed and patient's success at attaining the goal of low cholesterol was assessed.  RECOMMENDATION given include regular exercise 3 days a week and low cholesterol/low fat diet. CLINICAL SUMMARY including written plan to identify barriers unique to the patient due to social or economic  reasons was discussed.         I spent 30 minutes dedicated to the care of this patient on the date of this encounter to include face-to-face time with the patient, as well as: reviewed past records  Follow-up: Return in about 3 months (around 08/06/2020).  An After Visit Summary was printed and given to the patient.  Brent Bulla, MD [facility] [phone]

## 2020-05-10 LAB — CBC WITH DIFFERENTIAL/PLATELET
Basophils Absolute: 0.2 10*3/uL (ref 0.0–0.2)
Basos: 3 %
EOS (ABSOLUTE): 0.3 10*3/uL (ref 0.0–0.4)
Eos: 4 %
Hematocrit: 36.8 % — ABNORMAL LOW (ref 37.5–51.0)
Hemoglobin: 11.7 g/dL — ABNORMAL LOW (ref 13.0–17.7)
Lymphocytes Absolute: 1.4 10*3/uL (ref 0.7–3.1)
Lymphs: 21 %
MCH: 29.4 pg (ref 26.6–33.0)
MCHC: 31.8 g/dL (ref 31.5–35.7)
MCV: 93 fL (ref 79–97)
Monocytes Absolute: 0 10*3/uL — ABNORMAL LOW (ref 0.1–0.9)
Monocytes: 0 %
Neutrophils Absolute: 4.7 10*3/uL (ref 1.4–7.0)
Neutrophils: 72 %
Platelets: 98 10*3/uL — CL (ref 150–450)
RBC: 3.98 x10E6/uL — ABNORMAL LOW (ref 4.14–5.80)
RDW: 15.2 % (ref 11.6–15.4)
WBC: 6.5 10*3/uL (ref 3.4–10.8)

## 2020-05-10 LAB — COMPREHENSIVE METABOLIC PANEL
ALT: 12 IU/L (ref 0–44)
AST: 23 IU/L (ref 0–40)
Albumin/Globulin Ratio: 2 (ref 1.2–2.2)
Albumin: 4.1 g/dL (ref 3.6–4.6)
Alkaline Phosphatase: 74 IU/L (ref 44–121)
BUN/Creatinine Ratio: 12 (ref 10–24)
BUN: 10 mg/dL (ref 8–27)
Bilirubin Total: 0.6 mg/dL (ref 0.0–1.2)
CO2: 18 mmol/L — ABNORMAL LOW (ref 20–29)
Calcium: 8.9 mg/dL (ref 8.6–10.2)
Chloride: 111 mmol/L — ABNORMAL HIGH (ref 96–106)
Creatinine, Ser: 0.86 mg/dL (ref 0.76–1.27)
GFR calc Af Amer: 90 mL/min/{1.73_m2} (ref >59)
GFR calc non Af Amer: 78 mL/min/{1.73_m2} (ref >59)
Globulin, Total: 2.1 g/dL (ref 1.5–4.5)
Glucose: 87 mg/dL (ref 65–99)
Potassium: 4.3 mmol/L (ref 3.5–5.2)
Sodium: 146 mmol/L — ABNORMAL HIGH (ref 134–144)
Total Protein: 6.2 g/dL (ref 6.0–8.5)

## 2020-05-10 LAB — LIPID PANEL
Chol/HDL Ratio: 2.8 ratio (ref 0.0–5.0)
Cholesterol, Total: 126 mg/dL (ref 100–199)
HDL: 45 mg/dL (ref >39)
LDL Chol Calc (NIH): 62 mg/dL (ref 0–99)
Triglycerides: 100 mg/dL (ref 0–149)
VLDL Cholesterol Cal: 19 mg/dL (ref 5–40)

## 2020-05-10 LAB — CARDIOVASCULAR RISK ASSESSMENT

## 2020-05-10 NOTE — Progress Notes (Signed)
Still anemic, platelets low 98 this is chronic, kidney tests normal, liver tests normal, cholesterol normal,  lp

## 2020-05-18 DIAGNOSIS — R0789 Other chest pain: Secondary | ICD-10-CM | POA: Insufficient documentation

## 2020-05-18 DIAGNOSIS — G4730 Sleep apnea, unspecified: Secondary | ICD-10-CM | POA: Insufficient documentation

## 2020-05-18 DIAGNOSIS — F32A Depression, unspecified: Secondary | ICD-10-CM | POA: Insufficient documentation

## 2020-05-18 DIAGNOSIS — I350 Nonrheumatic aortic (valve) stenosis: Secondary | ICD-10-CM | POA: Insufficient documentation

## 2020-05-18 DIAGNOSIS — M1990 Unspecified osteoarthritis, unspecified site: Secondary | ICD-10-CM | POA: Insufficient documentation

## 2020-05-18 DIAGNOSIS — K219 Gastro-esophageal reflux disease without esophagitis: Secondary | ICD-10-CM | POA: Insufficient documentation

## 2020-05-18 DIAGNOSIS — F0390 Unspecified dementia without behavioral disturbance: Secondary | ICD-10-CM | POA: Insufficient documentation

## 2020-05-18 DIAGNOSIS — R0600 Dyspnea, unspecified: Secondary | ICD-10-CM | POA: Insufficient documentation

## 2020-05-18 DIAGNOSIS — I219 Acute myocardial infarction, unspecified: Secondary | ICD-10-CM | POA: Insufficient documentation

## 2020-05-18 DIAGNOSIS — G629 Polyneuropathy, unspecified: Secondary | ICD-10-CM | POA: Insufficient documentation

## 2020-05-26 ENCOUNTER — Other Ambulatory Visit: Payer: Self-pay

## 2020-05-26 ENCOUNTER — Ambulatory Visit (INDEPENDENT_AMBULATORY_CARE_PROVIDER_SITE_OTHER): Payer: Medicare Other | Admitting: Cardiology

## 2020-05-26 ENCOUNTER — Encounter: Payer: Self-pay | Admitting: Cardiology

## 2020-05-26 DIAGNOSIS — M21372 Foot drop, left foot: Secondary | ICD-10-CM | POA: Diagnosis not present

## 2020-05-26 DIAGNOSIS — Z951 Presence of aortocoronary bypass graft: Secondary | ICD-10-CM

## 2020-05-26 DIAGNOSIS — R0600 Dyspnea, unspecified: Secondary | ICD-10-CM

## 2020-05-26 DIAGNOSIS — I10 Essential (primary) hypertension: Secondary | ICD-10-CM

## 2020-05-26 DIAGNOSIS — M21371 Foot drop, right foot: Secondary | ICD-10-CM | POA: Diagnosis not present

## 2020-05-26 DIAGNOSIS — R0609 Other forms of dyspnea: Secondary | ICD-10-CM

## 2020-05-26 DIAGNOSIS — Z952 Presence of prosthetic heart valve: Secondary | ICD-10-CM | POA: Diagnosis not present

## 2020-05-26 MED ORDER — LISINOPRIL 10 MG PO TABS: 10.0000 mg | ORAL_TABLET | Freq: Every day | ORAL | 1 refills | Status: DC

## 2020-05-26 NOTE — Patient Instructions (Signed)
Medication Instructions:  Your physician has recommended you make the following change in your medication:  INCREASE: Lisinopril to 10 mg daily   *If you need a refill on your cardiac medications before your next appointment, please call your pharmacy*   Lab Work: Your physician recommends that you return for lab work in 1 week: bmp   If you have labs (blood work) drawn today and your tests are completely normal, you will receive your results only by: Marland Kitchen MyChart Message (if you have MyChart) OR . A paper copy in the mail If you have any lab test that is abnormal or we need to change your treatment, we will call you to review the results.   Testing/Procedures: None   Follow-Up: At [facility], you and your health needs are our priority.  As part of our continuing mission to provide you with exceptional heart care, we have created designated Provider Care Teams.  These Care Teams include your primary Cardiologist (physician) and Advanced Practice Providers (APPs -  Physician Assistants and Nurse Practitioners) who all work together to provide you with the care you need, when you need it.  We recommend signing up for the patient portal called "MyChart".  Sign up information is provided on this After Visit Summary.  MyChart is used to connect with patients for Virtual Visits (Telemedicine).  Patients are able to view lab/test results, encounter notes, upcoming appointments, etc.  Non-urgent messages can be sent to your provider as well.   To learn more about what you can do with MyChart, go to [email or web address].    Your next appointment:   6 week(s)  The format for your next appointment:   In Person  Provider:   Gypsy Balsam, MD   Other Instructions

## 2020-05-26 NOTE — Progress Notes (Signed)
Cardiology Office Note:    Date:  05/26/2020   ID:  Jack Hodges, DOB January 03, 1931, MRN [id number]  PCP:  Abigail Miyamoto, MD  Cardiologist:  Gypsy Balsam, MD    Referring MD: Abigail Miyamoto,*   No chief complaint on file. Problem with my blood pressure  History of Present Illness:    Jack Hodges is a 87 y.o. male with past medical history significant for coronary artery disease, in 1996 he required coronary artery bypass graft.  Also in summer 2020 when he got cardiac catheterization done he underwent PTCA and stenting of left anterior descending artery in view of the fact that he got completely occluded LIMA going to LAD.  That cardiac catheterization was done in preparation for TAVI that eventually ended up having done on 10/27/2019.  He received Edwards sapiens 3 26 mm valve.  He also got history of bilateral foot drop, essential hypertension, dyslipidemia. He comes today 2 months for follow-up.  Overall he is doing well the biggest complaint he have is about his foot drop he said he had difficulty walking denies have any chest pain tightness squeezing pressure burning chest.   Past Medical History:  Diagnosis Date  . Acute bursitis of left shoulder 01/04/2020  . Angina pectoris (HCC) 10/07/2019  . Arthritis   . Atherosclerosis of aorta (HCC) 09/24/2019  . Bilateral foot-drop 01/18/2017  . BMI 27.0-27.9,adult 09/24/2019  . Cellulitis of left hand 07/07/2019  . Coronary artery disease involving native coronary artery of native heart without angina pectoris 11/01/2015   Overview:  Bypass surgery in 1996 Overview:  Overview:  Bypass surgery in 1996  Last Assessment & Plan:  Continue his current regimen.  Follow up as noted.  . CVA (cerebral vascular accident) (HCC) 04/03/2016   Last Assessment & Plan:  The patient underwent extensive CVA workup.  MRI is negative.  He does have aortic stenosis by echocardiogram.  Upon review of Care everywhere he is followed by Dr. Bing Matter,  cardiologist for this.  Carotid ultrasound negative for hemodynamically significant stenosis.  Lipid panel within normal limits.  He has not had any further episodes of dizziness or nausea vomiting.   . Dementia (HCC)   . Depression   . Dizziness 04/19/2016  . DNR (do not resuscitate) 04/03/2016   Overview:  I had an extensive discussion with the patient on 04/03/2016 regarding his end of life wishes.  He and his daughter agree that do not resuscitate is in keeping with his beliefs.  . Dyslipidemia 11/01/2015   Last Assessment & Plan:  Formatting of this note might be different from the original. Cholesterol 136   Triglycerides  83  HDL  50.1  LDL Calculated  69  VLDL Cholesterol Cal  17   Continue statin.  Marland Kitchen Dyspnea    with activity  . Dyspnea on exertion 06/30/2018  . Essential hypertension 11/01/2015   Last Assessment & Plan:  Resume home medications.  Follow-up as noted above.  . Gastroenteritis due to COVID-19 virus 03/17/2020  . Generalized weakness 03/13/2020  . GERD (gastroesophageal reflux disease)   . Herpes zoster 05/19/2019  . History of aortic stenosis 03/11/2020  . Myocardial infarction (HCC)    approx 1994  . Neuropathy   . Other chest pain   . Pneumonia due to COVID-19 virus 03/13/2020  . Recurrent falls 06/25/2019  . 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  Severe aortic stenosis   . Sleep apnea   . 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  . Thrombocytopenia (HCC) 03/13/2020    Past Surgical History:  Procedure Laterality Date  . CARDIAC CATHETERIZATION    . CARDIAC SURGERY    . CATARACT EXTRACTION, BILATERAL    . CORONARY ARTERY BYPASS GRAFT    . CORONARY STENT INTERVENTION N/A 10/07/2019   Procedure: CORONARY STENT  INTERVENTION;  Surgeon: Tonny Bollman, MD;  Location: [facility] INVASIVE CV LAB;  Service: Cardiovascular;  Laterality: N/A;  . EYE SURGERY    . HEMORROIDECTOMY    . RIGHT/LEFT HEART CATH AND CORONARY/GRAFT ANGIOGRAPHY N/A 10/07/2019   Procedure: RIGHT/LEFT HEART CATH AND CORONARY/GRAFT ANGIOGRAPHY;  Surgeon: Tonny Bollman, MD;  Location: [facility] INVASIVE CV LAB;  Service: Cardiovascular;  Laterality: N/A;  . TEE WITHOUT CARDIOVERSION N/A 10/27/2019   Procedure: TRANSESOPHAGEAL ECHOCARDIOGRAM (TEE);  Surgeon: Kathleene Hazel, MD;  Location: [facility] INVASIVE CV LAB;  Service: Open Heart Surgery;  Laterality: N/A;  . TRANSCATHETER AORTIC VALVE REPLACEMENT, TRANSFEMORAL N/A 10/27/2019   Procedure: TRANSCATHETER AORTIC VALVE REPLACEMENT, TRANSFEMORAL;  Surgeon: Kathleene Hazel, MD;  Location: [facility] INVASIVE CV LAB;  Service: Open Heart Surgery;  Laterality: N/A;    Current Medications: Current Meds  Medication Sig  . acetaminophen (TYLENOL) 650 MG CR tablet Take 650 mg by mouth every 8 (eight) hours as needed for pain.   Marland Kitchen ascorbic acid (VITAMIN C) 500 MG tablet Take 1 tablet (500 mg total) by mouth daily.  Marland Kitchen aspirin EC 81 MG tablet Take 81 mg by mouth daily.  . cholecalciferol (VITAMIN D) 25 MCG (1000 UNIT) tablet TAKE 1 TABLET (1,000 UNITS TOTAL) BY MOUTH DAILY.  Marland Kitchen clopidogrel (PLAVIX) 75 MG tablet Take 1 tablet (75 mg total) by mouth at bedtime.  . famotidine (PEPCID) 20 MG tablet Take 20 mg by mouth daily.  Marland Kitchen gabapentin (NEURONTIN) 300 MG capsule Take 1 capsule (300 mg total) by mouth 2 (two) times daily.  . Ipratropium-Albuterol (COMBIVENT) 20-100 MCG/ACT AERS respimat Inhale 1 puff into the lungs 4 (four) times daily.  . meclizine (ANTIVERT) 25 MG tablet Take 1 tablet (25 mg total) by mouth 3 (three) times daily as needed for dizziness.  . nitroGLYCERIN (NITROSTAT) 0.4 MG SL tablet Place 1 tablet (0.4 mg total) under the tongue every 5 (five) minutes as needed for chest pain.  . pantoprazole  (PROTONIX) 40 MG tablet TAKE 1 TABLET BY MOUTH  DAILY  . rosuvastatin (CRESTOR) 20 MG tablet TAKE 1 TABLET BY MOUTH AT  BEDTIME (Patient taking differently: Take 20 mg by mouth at bedtime.)  . vitamin B-12 (CYANOCOBALAMIN) 500 MCG tablet Take 500 mcg by mouth daily.  Marland Kitchen zinc sulfate 220 (50 Zn) MG capsule Take 1 capsule (220 mg total) by mouth daily.  . [DISCONTINUED] lisinopril (ZESTRIL) 5 MG tablet Take 1 tablet (5 mg total) by mouth daily.     Allergies:   Poison oak extract   Social History   Socioeconomic History  . Marital status: Widowed    Spouse name: Not on file  . Number of children: 2  . Years of education: Not on file  . Highest education level: Not on file  Occupational History  . Occupation: retired  Tobacco Use  . Smoking status: Former Smoker    Types: Cigarettes, Cigars    Quit date: 05/02/1962  Years since quitting: 58.1  . Smokeless tobacco: Never Used  Vaping Use  . Vaping Use: Never used  Substance and Sexual Activity  . Alcohol use: No  . Drug use: No  . Sexual activity: Not Currently  Other Topics Concern  . Not on file  Social History Narrative  . Not on file   Social Determinants of Health   Financial Resource Strain: Not on file  Food Insecurity: No Food Insecurity  . Worried About Programme researcher, broadcasting/film/video in the Last Year: Never true  . Ran Out of Food in the Last Year: Never true  Transportation Needs: No Transportation Needs  . Lack of Transportation (Medical): No  . Lack of Transportation (Non-Medical): No  Physical Activity: Not on file  Stress: Not on file  Social Connections: Not on file     Family History: The patient's family history includes Arthritis in his brother and father; Cancer in his brother and mother; Diabetes in his mother; Hypertension in his father; Kidney disease in his mother; Migraines in his father; Sleep disorder in his father and mother; Stroke in his mother. ROS:   Please see the history of present illness.     All 14 point review of systems negative except as described per history of present illness  EKGs/Labs/Other Studies Reviewed:      Recent Labs: 10/23/2019: B Natriuretic Peptide 108.8 03/17/2020: Magnesium 2.2 05/09/2020: ALT 12; BUN 10; Creatinine, Ser 0.86; Hemoglobin 11.7; Platelets 98; Potassium 4.3; Sodium 146  Recent Lipid Panel    Component Value Date/Time   CHOL 126 05/09/2020 0826   TRIG 100 05/09/2020 0826   HDL 45 05/09/2020 0826   CHOLHDL 2.8 05/09/2020 0826   LDLCALC 62 05/09/2020 0826    Physical Exam:    VS:  There were no vitals taken for this visit.    Wt Readings from Last 3 Encounters:  05/09/20 169 lb 9.6 oz (76.9 kg)  04/06/20 166 lb 3.2 oz (75.4 kg)  03/14/20 164 lb 14.5 oz (74.8 kg)     GEN:  Well nourished, well developed in no acute distress HEENT: Normal NECK: No JVD; No carotid bruits LYMPHATICS: No lymphadenopathy CARDIAC: RRR, systolic ejection murmur grade 2/6 best heard right upper portion of the sternum, no rubs, no gallops RESPIRATORY:  Clear to auscultation without rales, wheezing or rhonchi  ABDOMEN: Soft, non-tender, non-distended MUSCULOSKELETAL:  No edema; No deformity  SKIN: Warm and dry LOWER EXTREMITIES: no swelling NEUROLOGIC:  Alert and oriented x 3 PSYCHIATRIC:  Normal affect   ASSESSMENT:    1. S/P TAVR (transcatheter aortic valve replacement)   2. Essential hypertension   3. Status post coronary artery bypass graft   4. Dyspnea on exertion   5. Bilateral foot-drop    PLAN:    In order of problems listed above:  1. Status post TAVI doing well from that point review.  Next year we will do echocardiogram on follow-up. 2. Essential hypertension: Uncontrolled.  I will increase dose of lisinopril to 10 mg daily and will recheck his Chem-7 within a week. 3. Status post coronary bypass graft.  Stable he did have PTCA and stenting done to left anterior descending artery about a year ago. 4. Bilateral foot drop that appears  to be the biggest complaint that he is concerned about.  I asked him to talk to his primary care physician about it.   Medication Adjustments/Labs and Tests Ordered: Current medicines are reviewed at length with the patient today.  Concerns regarding  medicines are outlined above.  Orders Placed This Encounter  Procedures  . Basic metabolic panel  . EKG 12-Lead   Medication changes:  Meds ordered this encounter  Medications  . lisinopril (ZESTRIL) 10 MG tablet    Sig: Take 1 tablet (10 mg total) by mouth daily.    Dispense:  90 tablet    Refill:  1    Signed, Georgeanna Lea, MD, [facility] 05/26/2020 2:52 PM    [facility]

## 2020-06-01 DIAGNOSIS — I10 Essential (primary) hypertension: Secondary | ICD-10-CM | POA: Diagnosis not present

## 2020-06-01 LAB — BASIC METABOLIC PANEL
BUN/Creatinine Ratio: 18 (ref 10–24)
BUN: 14 mg/dL (ref 8–27)
CO2: 21 mmol/L (ref 20–29)
Calcium: 9.3 mg/dL (ref 8.6–10.2)
Chloride: 107 mmol/L — ABNORMAL HIGH (ref 96–106)
Creatinine, Ser: 0.8 mg/dL (ref 0.76–1.27)
Glucose: 85 mg/dL (ref 65–99)
Potassium: 4.4 mmol/L (ref 3.5–5.2)
Sodium: 143 mmol/L (ref 134–144)
eGFR: 86 mL/min/{1.73_m2} (ref >59)

## 2020-06-27 ENCOUNTER — Other Ambulatory Visit: Payer: Self-pay | Admitting: Legal Medicine

## 2020-06-27 DIAGNOSIS — F339 Major depressive disorder, recurrent, unspecified: Secondary | ICD-10-CM

## 2020-07-07 ENCOUNTER — Encounter: Payer: Self-pay | Admitting: Cardiology

## 2020-07-07 ENCOUNTER — Other Ambulatory Visit: Payer: Self-pay

## 2020-07-07 ENCOUNTER — Ambulatory Visit (INDEPENDENT_AMBULATORY_CARE_PROVIDER_SITE_OTHER): Payer: Medicare Other | Admitting: Cardiology

## 2020-07-07 VITALS — BP 120/58 | HR 55 | Ht 65.0 in | Wt 165.0 lb

## 2020-07-07 DIAGNOSIS — I10 Essential (primary) hypertension: Secondary | ICD-10-CM

## 2020-07-07 DIAGNOSIS — E785 Hyperlipidemia, unspecified: Secondary | ICD-10-CM | POA: Diagnosis not present

## 2020-07-07 DIAGNOSIS — Z951 Presence of aortocoronary bypass graft: Secondary | ICD-10-CM | POA: Diagnosis not present

## 2020-07-07 DIAGNOSIS — Z952 Presence of prosthetic heart valve: Secondary | ICD-10-CM | POA: Diagnosis not present

## 2020-07-07 MED ORDER — METOPROLOL SUCCINATE ER 25 MG PO TB24: 25.0000 mg | ORAL_TABLET | Freq: Every day | ORAL | 0 refills | Status: DC

## 2020-07-07 MED ORDER — LISINOPRIL 10 MG PO TABS: 5.0000 mg | ORAL_TABLET | Freq: Every day | ORAL | 1 refills | Status: DC

## 2020-07-07 MED ORDER — METOPROLOL SUCCINATE ER 25 MG PO TB24: 25.0000 mg | ORAL_TABLET | Freq: Every day | ORAL | 3 refills | Status: DC

## 2020-07-07 NOTE — Progress Notes (Signed)
Cardiology Office Note:    Date:  07/07/2020   ID:  Jack Hodges, DOB Aug 15, 1930, MRN [id number]  PCP:  Abigail Miyamoto, MD  Cardiologist:  Gypsy Balsam, MD    Referring MD: Abigail Miyamoto,*   Chief Complaint  Patient presents with  . Shortness of Breath  . Tremors    History of Present Illness:    Jack Hodges is a 87 y.o. male   with past medical history significant for coronary artery disease, in 1996 he required coronary artery bypass graft.  Also in summer 2020 when he got cardiac catheterization done he underwent PTCA and stenting of left anterior descending artery in view of the fact that he got completely occluded LIMA going to LAD.  That cardiac catheterization was done in preparation for TAVI that eventually ended up having done on 10/27/2019.  He received Edwards sapiens 3 26 mm valve.  He also got history of bilateral foot drop, essential hypertension, dyslipidemia. Comes today to my office for of follow-up overall biggest complaint is today tremor he does have shaky hands to the point that some does have difficulty holding his coffee.  Also described to father he have to wake up many times during the night not because of shortness of breath not because of need to go to the restroom just simply wakes up have difficulty falling asleep.  He correlate those 2 things to get him thinking that tremor may trigger him to wake up.  Past Medical History:  Diagnosis Date  . Acute bursitis of left shoulder 01/04/2020  . Acute respiratory failure with hypoxia (HCC) 03/17/2020  . Angina pectoris (HCC) 10/07/2019  . Arthritis   . Atherosclerosis of aorta (HCC) 09/24/2019  . Bilateral foot-drop 01/18/2017  . BMI 27.0-27.9,adult 09/24/2019  . Cellulitis of left hand 07/07/2019  . Coronary artery disease involving native coronary artery of native heart without angina pectoris 11/01/2015   Overview:  Bypass surgery in 1996 Overview:  Overview:  Bypass surgery in 1996  Last  Assessment & Plan:  Continue his current regimen.  Follow up as noted.  . CVA (cerebral vascular accident) (HCC) 04/03/2016   Last Assessment & Plan:  The patient underwent extensive CVA workup.  MRI is negative.  He does have aortic stenosis by echocardiogram.  Upon review of Care everywhere he is followed by Dr. Bing Matter, cardiologist for this.  Carotid ultrasound negative for hemodynamically significant stenosis.  Lipid panel within normal limits.  He has not had any further episodes of dizziness or nausea vomiting.   . Dementia (HCC)   . Depression   . Dizziness 04/19/2016  . DNR (do not resuscitate) 04/03/2016   Overview:  I had an extensive discussion with the patient on 04/03/2016 regarding his end of life wishes.  He and his daughter agree that do not resuscitate is in keeping with his beliefs.  . Dyslipidemia 11/01/2015   Last Assessment & Plan:  Formatting of this note might be different from the original. Cholesterol 136   Triglycerides  83  HDL  50.1  LDL Calculated  69  VLDL Cholesterol Cal  17   Continue statin.  Marland Kitchen Dyspnea    with activity  . Dyspnea on exertion 06/30/2018  . Essential hypertension 11/01/2015   Last Assessment & Plan:  Resume home medications.  Follow-up as noted above.  . Gastroenteritis due to COVID-19 virus 03/17/2020  . Generalized weakness 03/13/2020  . GERD (gastroesophageal reflux disease)   . Herpes zoster 05/19/2019  .  History of aortic stenosis 03/11/2020  . Myocardial infarction (HCC)    approx 1994  . Neuropathy   . Other chest pain   . Pneumonia due to COVID-19 virus 03/13/2020  . Recurrent falls 06/25/2019  . 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 Severe aortic stenosis   . Sleep apnea   . 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 Status post coronary artery bypass graft 11/01/2015   Last  Assessment & Plan:  Continue home regimen.  . Thrombocytopenia (HCC) 03/13/2020    Past Surgical History:  Procedure Laterality Date  . CARDIAC CATHETERIZATION    . CARDIAC SURGERY    . CATARACT EXTRACTION, BILATERAL    . CORONARY ARTERY BYPASS GRAFT    . CORONARY STENT INTERVENTION N/A 10/07/2019   Procedure: CORONARY STENT INTERVENTION;  Surgeon: Tonny Bollman, MD;  Location: [facility] INVASIVE CV LAB;  Service: Cardiovascular;  Laterality: N/A;  . EYE SURGERY    . HEMORROIDECTOMY    . RIGHT/LEFT HEART CATH AND CORONARY/GRAFT ANGIOGRAPHY N/A 10/07/2019   Procedure: RIGHT/LEFT HEART CATH AND CORONARY/GRAFT ANGIOGRAPHY;  Surgeon: Tonny Bollman, MD;  Location: [facility] INVASIVE CV LAB;  Service: Cardiovascular;  Laterality: N/A;  . TEE WITHOUT CARDIOVERSION N/A 10/27/2019   Procedure: TRANSESOPHAGEAL ECHOCARDIOGRAM (TEE);  Surgeon: Kathleene Hazel, MD;  Location: [facility] INVASIVE CV LAB;  Service: Open Heart Surgery;  Laterality: N/A;  . TRANSCATHETER AORTIC VALVE REPLACEMENT, TRANSFEMORAL N/A 10/27/2019   Procedure: TRANSCATHETER AORTIC VALVE REPLACEMENT, TRANSFEMORAL;  Surgeon: Kathleene Hazel, MD;  Location: [facility] INVASIVE CV LAB;  Service: Open Heart Surgery;  Laterality: N/A;    Current Medications: Current Meds  Medication Sig  . acetaminophen (TYLENOL) 650 MG CR tablet Take 650 mg by mouth every 8 (eight) hours as needed for pain.   Marland Kitchen ascorbic acid (VITAMIN C) 500 MG tablet Take 1 tablet (500 mg total) by mouth daily.  Marland Kitchen aspirin EC 81 MG tablet Take 81 mg by mouth daily.  . cholecalciferol (VITAMIN D) 25 MCG (1000 UNIT) tablet Take 1,000 Units by mouth daily.  . clopidogrel (PLAVIX) 75 MG tablet Take 1 tablet (75 mg total) by mouth at bedtime.  Marland Kitchen dexamethasone (DECADRON) 6 MG tablet TAKE 1 TABLETS (6 MG TOTAL) BY MOUTH DAILY. (Patient taking differently: Take 6 mg by mouth daily.)  . Dextromethorphan-guaiFENesin 10-100 MG/5ML liquid TAKE 10 MLS BY MOUTH EVERY 4 (FOUR) HOURS AS NEEDED  FOR COUGH. (Patient taking differently: Take 5 mLs by mouth as needed (cough).)  . famotidine (PEPCID) 20 MG tablet Take 20 mg by mouth daily.  Marland Kitchen gabapentin (NEURONTIN) 300 MG capsule Take 1 capsule (300 mg total) by mouth 2 (two) times daily.  . Ipratropium-Albuterol (COMBIVENT) 20-100 MCG/ACT AERS respimat INHALE 1 PUFF INTO THE LUNGS 4 (FOUR) TIMES DAILY. (Patient taking differently: Inhale 1 puff into the lungs every 6 (six) hours as needed for wheezing or shortness of breath.)  . lisinopril (ZESTRIL) 10 MG tablet Take 1 tablet (10 mg total) by mouth daily.  . meclizine (ANTIVERT) 25 MG tablet Take 1 tablet (25 mg total) by mouth 3 (three) times daily as needed for dizziness.  . nitroGLYCERIN (NITROSTAT) 0.4 MG SL tablet Place 1 tablet (0.4 mg total) under the tongue every 5 (five) minutes as needed for chest pain.  . pantoprazole (PROTONIX) 40 MG tablet TAKE 1 TABLET BY MOUTH  DAILY (Patient taking differently: Take  40 mg by mouth daily.)  . rosuvastatin (CRESTOR) 20 MG tablet TAKE 1 TABLET BY MOUTH AT  BEDTIME (Patient taking differently: Take 20 mg by mouth at bedtime.)  . vitamin B-12 (CYANOCOBALAMIN) 500 MCG tablet Take 500 mcg by mouth daily.  . vitamin C (ASCORBIC ACID) 500 MG tablet TAKE 1 TABLET (500 MG TOTAL) BY MOUTH DAILY. (Patient taking differently: Take 500 mg by mouth daily.)  . zinc sulfate 220 (50 Zn) MG capsule Take 1 capsule (220 mg total) by mouth daily.     Allergies:   Poison oak extract   Social History   Socioeconomic History  . Marital status: Widowed    Spouse name: Not on file  . Number of children: 2  . Years of education: Not on file  . Highest education level: Not on file  Occupational History  . Occupation: retired  Tobacco Use  . Smoking status: Former Smoker    Types: Cigarettes, Cigars    Quit date: 05/02/1962    Years since quitting: 58.2  . Smokeless tobacco: Never Used  Vaping Use  . Vaping Use: Never used  Substance and Sexual Activity  .  Alcohol use: No  . Drug use: No  . Sexual activity: Not Currently  Other Topics Concern  . Not on file  Social History Narrative  . Not on file   Social Determinants of Health   Financial Resource Strain: Not on file  Food Insecurity: Not on file  Transportation Needs: Not on file  Physical Activity: Not on file  Stress: Not on file  Social Connections: Not on file     Family History: The patient's family history includes Arthritis in his brother and father; Cancer in his brother and mother; Diabetes in his mother; Hypertension in his father; Kidney disease in his mother; Migraines in his father; Sleep disorder in his father and mother; Stroke in his mother. ROS:   Please see the history of present illness.    All 14 point review of systems negative except as described per history of present illness  EKGs/Labs/Other Studies Reviewed:      Recent Labs: 10/23/2019: B Natriuretic Peptide 108.8 03/17/2020: Magnesium 2.2 05/09/2020: ALT 12; Hemoglobin 11.7; Platelets 98 06/01/2020: BUN 14; Creatinine, Ser 0.80; Potassium 4.4; Sodium 143  Recent Lipid Panel    Component Value Date/Time   CHOL 126 05/09/2020 0826   TRIG 100 05/09/2020 0826   HDL 45 05/09/2020 0826   CHOLHDL 2.8 05/09/2020 0826   LDLCALC 62 05/09/2020 0826    Physical Exam:    VS:  BP (!) 120/58 (BP Location: Left Arm, Patient Position: Sitting)   Pulse (!) 55   Ht 5\' 5"  (1.651 m)   Wt 165 lb (74.8 kg)   SpO2 97%   BMI 27.46 kg/m     Wt Readings from Last 3 Encounters:  07/07/20 165 lb (74.8 kg)  05/09/20 169 lb 9.6 oz (76.9 kg)  04/06/20 166 lb 3.2 oz (75.4 kg)     GEN:  Well nourished, well developed in no acute distress HEENT: Normal NECK: No JVD; No carotid bruits LYMPHATICS: No lymphadenopathy CARDIAC: RRR, no murmurs, no rubs, no gallops RESPIRATORY:  Clear to auscultation without rales, wheezing or rhonchi  ABDOMEN: Soft, non-tender, non-distended MUSCULOSKELETAL:  No edema; No deformity   SKIN: Warm and dry LOWER EXTREMITIES: no swelling NEUROLOGIC:  Alert and oriented x 3 PSYCHIATRIC:  Normal affect   ASSESSMENT:    1. S/P TAVR (transcatheter aortic valve replacement)  2. Status post coronary artery bypass graft   3. Essential hypertension   4. Dyslipidemia    PLAN:    In order of problems listed above:  1. Status post TAVR last evaluation of the valve done in September normal function doing well. 2. Status post coronary bypass graft stable. 3. Essential hypertension blood pressure appropriately controlled however what I am thinking about is giving to him small dose of beta-blockers we can control his tremor.  Her EKG will be done if there is no AV conduction problem we will give him 25 mg metoprolol succinate I will lowered Zestril to only 5 mg daily.  I will check Chem-7 cholesterol today.  Also proBNP will be checked. 4. Dyslipidemia fasting lipid profile will be done today   Medication Adjustments/Labs and Tests Ordered: Current medicines are reviewed at length with the patient today.  Concerns regarding medicines are outlined above.  No orders of the defined types were placed in this encounter.  Medication changes: No orders of the defined types were placed in this encounter.   Signed, Georgeanna Lea, MD, [facility] 07/07/2020 2:36 PM    [facility]

## 2020-07-07 NOTE — Patient Instructions (Signed)
Medication Instructions:  Your physician has recommended you make the following change in your medication:   Decrease your Zestril to 5 mg daily. Take 1/2 tablet daily. Start Metoprolol succinate 25 mg daily.  *If you need a refill on your cardiac medications before your next appointment, please call your pharmacy*   Lab Work: Your physician recommends that you have labs done in the office today. Your test included  basic metabolic panel, bnp and lipids.  If you have labs (blood work) drawn today and your tests are completely normal, you will receive your results only by: [facility] MyChart Message (if you have MyChart) OR . A paper copy in the mail If you have any lab test that is abnormal or we need to change your treatment, we will call you to review the results.   Testing/Procedures: None ordered   Follow-Up: At [facility], you and your health needs are our priority.  As part of our continuing mission to provide you with exceptional heart care, we have created designated Provider Care Teams.  These Care Teams include your primary Cardiologist (physician) and Advanced Practice Providers (APPs -  Physician Assistants and Nurse Practitioners) who all work together to provide you with the care you need, when you need it.  We recommend signing up for the patient portal called "MyChart".  Sign up information is provided on this After Visit Summary.  MyChart is used to connect with patients for Virtual Visits (Telemedicine).  Patients are able to view lab/test results, encounter notes, upcoming appointments, etc.  Non-urgent messages can be sent to your provider as well.   To learn more about what you can do with MyChart, go to [email or web address].    Your next appointment:   5 month(s)  The format for your next appointment:   In Person  Provider:   Gypsy Balsam, MD   Other Instructions Metoprolol Extended-Release Tablets What is this medicine? METOPROLOL (me TOE proe lole)  is a beta blocker. It decreases the amount of work your heart has to do and helps your heart beat regularly. It treats high blood pressure and/or prevent chest pain (also called angina). It also treats heart failure. This medicine may be used for other purposes; ask your health care provider or pharmacist if you have questions. COMMON BRAND NAME(S): toprol, Toprol XL What should I tell my health care provider before I take this medicine? They need to know if you have any of these conditions:  diabetes  heart or vessel disease like slow heart rate, worsening heart failure, heart block, sick sinus syndrome or Raynaud's disease  kidney disease  liver disease  lung or breathing disease, like asthma or emphysema  pheochromocytoma  thyroid disease  an unusual or allergic reaction to metoprolol, other beta-blockers, medicines, foods, dyes, or preservatives  pregnant or trying to get pregnant  breast-feeding How should I use this medicine? Take this drug by mouth. Take it as directed on the prescription label at the same time every day. Take it with food. You may cut the tablet in half if it is scored (has a line in the middle of it). This may help you swallow the tablet if the whole tablet is too big. Be sure to take both halves. Do not take just one-half of the tablet. Keep taking it unless your health care provider tells you to stop. Talk to your health care provider about the use of this drug in children. While it may be prescribed for children as young as 6  for selected conditions, precautions do apply. Overdosage: If you think you have taken too much of this medicine contact a poison control center or emergency room at once. NOTE: This medicine is only for you. Do not share this medicine with others. What if I miss a dose? If you miss a dose, take it as soon as you can. If it is almost time for your next dose, take only that dose. Do not take double or extra doses. What may interact  with this medicine? This medicine may interact with the following medications:  certain medicines for blood pressure, heart disease, irregular heart beat  certain medicines for depression, like monoamine oxidase (MAO) inhibitors, fluoxetine, or paroxetine  clonidine  dobutamine  epinephrine  isoproterenol  reserpine This list may not describe all possible interactions. Give your health care provider a list of all the medicines, herbs, non-prescription drugs, or dietary supplements you use. Also tell them if you smoke, drink alcohol, or use illegal drugs. Some items may interact with your medicine. What should I watch for while using this medicine? Visit your doctor or health care professional for regular check ups. Contact your doctor right away if your symptoms worsen. Check your blood pressure and pulse rate regularly. Ask your health care professional what your blood pressure and pulse rate should be, and when you should contact them. You may get drowsy or dizzy. Do not drive, use machinery, or do anything that needs mental alertness until you know how this medicine affects you. Do not sit or stand up quickly, especially if you are an older patient. This reduces the risk of dizzy or fainting spells. Contact your doctor if these symptoms continue. Alcohol may interfere with the effect of this medicine. Avoid alcoholic drinks. This medicine may increase blood sugar. Ask your healthcare provider if changes in diet or medicines are needed if you have diabetes. What side effects may I notice from receiving this medicine? Side effects that you should report to your doctor or health care professional as soon as possible:  allergic reactions like skin rash, itching or hives  cold or numb hands or feet  depression  difficulty breathing  faint  fever with sore throat  irregular heartbeat, chest pain  rapid weight gain  signs and symptoms of high blood sugar such as being more thirsty  or hungry or having to urinate more than normal. You may also feel very tired or have blurry vision.  swollen legs or ankles Side effects that usually do not require medical attention (report to your doctor or health care professional if they continue or are bothersome):  anxiety or nervousness  change in sex drive or performance  dry skin  headache  nightmares or trouble sleeping  short term memory loss  stomach upset or diarrhea This list may not describe all possible side effects. Call your doctor for medical advice about side effects. You may report side effects to FDA at 1-800-FDA-1088. Where should I keep my medicine? Keep out of the reach of children and pets. Store at room temperature between 20 and 25 degrees C (68 and 77 degrees F). Throw away any unused drug after the expiration date. NOTE: This sheet is a summary. It may not cover all possible information. If you have questions about this medicine, talk to your doctor, pharmacist, or health care provider.  2021 [employer] (2018-10-23 18:23:00)

## 2020-07-08 LAB — BASIC METABOLIC PANEL
BUN/Creatinine Ratio: 16 (ref 10–24)
BUN: 16 mg/dL (ref 8–27)
CO2: 20 mmol/L (ref 20–29)
Calcium: 9.3 mg/dL (ref 8.6–10.2)
Chloride: 107 mmol/L — ABNORMAL HIGH (ref 96–106)
Creatinine, Ser: 1.03 mg/dL (ref 0.76–1.27)
Glucose: 175 mg/dL — ABNORMAL HIGH (ref 65–99)
Potassium: 4.1 mmol/L (ref 3.5–5.2)
Sodium: 143 mmol/L (ref 134–144)
eGFR: 70 mL/min/{1.73_m2} (ref >59)

## 2020-07-08 LAB — LIPID PANEL
Chol/HDL Ratio: 3.7 ratio (ref 0.0–5.0)
Cholesterol, Total: 139 mg/dL (ref 100–199)
HDL: 38 mg/dL — ABNORMAL LOW (ref >39)
LDL Chol Calc (NIH): 52 mg/dL (ref 0–99)
Triglycerides: 322 mg/dL — ABNORMAL HIGH (ref 0–149)
VLDL Cholesterol Cal: 49 mg/dL — ABNORMAL HIGH (ref 5–40)

## 2020-07-08 LAB — BRAIN NATRIURETIC PEPTIDE: BNP: 122.2 pg/mL — ABNORMAL HIGH (ref 0.0–100.0)

## 2020-07-13 ENCOUNTER — Other Ambulatory Visit: Payer: Self-pay

## 2020-07-13 ENCOUNTER — Ambulatory Visit (INDEPENDENT_AMBULATORY_CARE_PROVIDER_SITE_OTHER): Payer: Medicare Other

## 2020-07-13 ENCOUNTER — Telehealth: Payer: Self-pay

## 2020-07-13 VITALS — BP 124/72 | HR 72 | Temp 97.8°F | Resp 16 | Ht 65.0 in | Wt 165.0 lb

## 2020-07-13 DIAGNOSIS — Z0000 Encounter for general adult medical examination without abnormal findings: Secondary | ICD-10-CM

## 2020-07-13 DIAGNOSIS — Z6827 Body mass index (BMI) 27.0-27.9, adult: Secondary | ICD-10-CM

## 2020-07-13 DIAGNOSIS — Z28310 Unvaccinated for covid-19: Secondary | ICD-10-CM

## 2020-07-13 MED ORDER — SERTRALINE HCL 50 MG PO TABS: 50.0000 mg | ORAL_TABLET | Freq: Every day | ORAL | 2 refills | Status: DC

## 2020-07-13 NOTE — Progress Notes (Signed)
Subjective:   Jack Hodges is a 87 y.o. male who presents for Medicare Annual/Subsequent preventive examination.  This wellness visit is conducted by a nurse.  The patient's medications were reviewed and reconciled since the patient's last visit.  History details were provided by the patient.  The history appears to be reliable.    Patient's last AWV was over one year ago.   Medical History: Patient history and Family history was reviewed  Medications, Allergies, and preventative health maintenance was reviewed and updated.   Review of Systems    Review of Systems  Constitutional: Negative.   HENT: Positive for hearing loss and tinnitus.   Eyes: Positive for discharge.  Respiratory: Negative.  Negative for cough, chest tightness and shortness of breath.   Cardiovascular: Negative.  Negative for chest pain and palpitations.  Gastrointestinal: Negative.   Genitourinary: Negative.   Musculoskeletal: Positive for arthralgias and gait problem.       Let shoulder pain, left hand pain  Neurological: Positive for dizziness, tremors and numbness. Negative for light-headedness and headaches.       Tremors are worse since having COVID in December  Hematological: Bruises/bleeds easily.  Psychiatric/Behavioral: Negative for confusion, dysphoric mood, sleep disturbance and suicidal ideas. The patient is not nervous/anxious.    Cardiac Risk Factors include: advanced age (>58men, >48 women);dyslipidemia;hypertension;male gender     Objective:    Today's Vitals   07/13/20 0848  BP: 124/72  Pulse: 72  Resp: 16  Temp: 97.8 F (36.6 C)  SpO2: 94%  Weight: 165 lb (74.8 kg)  Height: 5\' 5"  (1.651 m)  PainSc: 2   PainLoc: Hand   Body mass index is 27.46 kg/m.  Advanced Directives 07/13/2020 03/14/2020 10/28/2019 10/27/2019 10/23/2019 10/20/2019 10/08/2019  Does Patient Have a Medical Advance Directive? Yes Yes Yes - Yes Yes Yes  Type of Advance Directive Healthcare Power of [place];Living will  Living will Living will - Living will Living will Living will  Does patient want to make changes to medical advance directive? No - Patient declined No - Patient declined No - Patient declined No - Patient declined No - Patient declined - No - Patient declined  Copy of Healthcare Power of Attorney in Chart? No - copy requested - - - - - -  Would patient like information on creating a medical advance directive? - - - - - - -    Current Medications (verified) Outpatient Encounter Medications as of 07/13/2020  Medication Sig  . acetaminophen (TYLENOL) 650 MG CR tablet Take 650 mg by mouth every 8 (eight) hours as needed for pain.   07/15/2020 ascorbic acid (VITAMIN C) 500 MG tablet Take 1 tablet (500 mg total) by mouth daily.  Marland Kitchen aspirin EC 81 MG tablet Take 81 mg by mouth daily.  . cholecalciferol (VITAMIN D) 25 MCG (1000 UNIT) tablet Take 1,000 Units by mouth daily.  . clopidogrel (PLAVIX) 75 MG tablet Take 1 tablet (75 mg total) by mouth at bedtime.  . gabapentin (NEURONTIN) 300 MG capsule Take 1 capsule (300 mg total) by mouth 2 (two) times daily.  Marland Kitchen lisinopril (ZESTRIL) 10 MG tablet Take 0.5 tablets (5 mg total) by mouth daily.  . meclizine (ANTIVERT) 25 MG tablet Take 1 tablet (25 mg total) by mouth 3 (three) times daily as needed for dizziness.  . nitroGLYCERIN (NITROSTAT) 0.4 MG SL tablet Place 1 tablet (0.4 mg total) under the tongue every 5 (five) minutes as needed for chest pain.  . pantoprazole (PROTONIX) 40  MG tablet TAKE 1 TABLET BY MOUTH  DAILY (Patient taking differently: Take 40 mg by mouth daily.)  . rosuvastatin (CRESTOR) 20 MG tablet TAKE 1 TABLET BY MOUTH AT  BEDTIME (Patient taking differently: Take 20 mg by mouth at bedtime.)  . zinc sulfate 220 (50 Zn) MG capsule Take 1 capsule (220 mg total) by mouth daily.  . metoprolol succinate (TOPROL XL) 25 MG 24 hr tablet Take 1 tablet (25 mg total) by mouth daily. (Patient not taking: Reported on 07/13/2020)  . metoprolol succinate (TOPROL XL)  25 MG 24 hr tablet Take 1 tablet (25 mg total) by mouth daily. (Patient not taking: Reported on 07/13/2020)  . vitamin B-12 (CYANOCOBALAMIN) 500 MCG tablet Take 500 mcg by mouth daily. (Patient not taking: Reported on 07/13/2020)  . [DISCONTINUED] albuterol (VENTOLIN HFA) 108 (90 Base) MCG/ACT inhaler Inhale 2 puffs into the lungs every 2 (two) hours as needed for wheezing or shortness of breath.  . [DISCONTINUED] dexamethasone (DECADRON) 6 MG tablet TAKE 1 TABLETS (6 MG TOTAL) BY MOUTH DAILY. (Patient not taking: Reported on 07/13/2020)  . [DISCONTINUED] Dextromethorphan-guaiFENesin 10-100 MG/5ML liquid TAKE 10 MLS BY MOUTH EVERY 4 (FOUR) HOURS AS NEEDED FOR COUGH. (Patient not taking: Reported on 07/13/2020)  . [DISCONTINUED] famotidine (PEPCID) 20 MG tablet Take 20 mg by mouth daily.  . [DISCONTINUED] Ipratropium-Albuterol (COMBIVENT) 20-100 MCG/ACT AERS respimat INHALE 1 PUFF INTO THE LUNGS 4 (FOUR) TIMES DAILY. (Patient taking differently: Inhale 1 puff into the lungs every 6 (six) hours as needed for wheezing or shortness of breath.)  . [DISCONTINUED] lisinopril (ZESTRIL) 10 MG tablet Take 1 tablet (10 mg total) by mouth daily.  . [DISCONTINUED] vitamin C (ASCORBIC ACID) 500 MG tablet TAKE 1 TABLET (500 MG TOTAL) BY MOUTH DAILY. (Patient not taking: Reported on 07/13/2020)  . [DISCONTINUED] Zinc Sulfate 220 (50 Zn) MG TABS TAKE 1 TABLET (220 MG TOTAL) BY MOUTH DAILY.   No facility-administered encounter medications on file as of 07/13/2020.    Allergies (verified) Poison oak extract   History: Past Medical History:  Diagnosis Date  . Acute bursitis of left shoulder 01/04/2020  . Acute respiratory failure with hypoxia (HCC) 03/17/2020  . Angina pectoris (HCC) 10/07/2019  . Arthritis   . Atherosclerosis of aorta (HCC) 09/24/2019  . Bilateral foot-drop 01/18/2017  . BMI 27.0-27.9,adult 09/24/2019  . Cellulitis of left hand 07/07/2019  . Coronary artery disease involving native coronary artery of  native heart without angina pectoris 11/01/2015   Overview:  Bypass surgery in 1996 Overview:  Overview:  Bypass surgery in 1996  Last Assessment & Plan:  Continue his current regimen.  Follow up as noted.  . CVA (cerebral vascular accident) (HCC) 04/03/2016   Last Assessment & Plan:  The patient underwent extensive CVA workup.  MRI is negative.  He does have aortic stenosis by echocardiogram.  Upon review of Care everywhere he is followed by Dr. Bing Matter, cardiologist for this.  Carotid ultrasound negative for hemodynamically significant stenosis.  Lipid panel within normal limits.  He has not had any further episodes of dizziness or nausea vomiting.   . Dementia (HCC)   . Depression   . Dizziness 04/19/2016  . DNR (do not resuscitate) 04/03/2016   Overview:  I had an extensive discussion with the patient on 04/03/2016 regarding his end of life wishes.  He and his daughter agree that do not resuscitate is in keeping with his beliefs.  . Dyslipidemia 11/01/2015   Last Assessment & Plan:  Formatting of this  note might be different from the original. Cholesterol 136   Triglycerides  83  HDL  50.1  LDL Calculated  69  VLDL Cholesterol Cal  17   Continue statin.  Marland Kitchen Dyspnea    with activity  . Dyspnea on exertion 06/30/2018  . Essential hypertension 11/01/2015   Last Assessment & Plan:  Resume home medications.  Follow-up as noted above.  . Gastroenteritis due to COVID-19 virus 03/17/2020  . Generalized weakness 03/13/2020  . GERD (gastroesophageal reflux disease)   . Herpes zoster 05/19/2019  . History of aortic stenosis 03/11/2020  . Myocardial infarction (HCC)    approx 1994  . Neuropathy   . Other chest pain   . Pneumonia due to COVID-19 virus 03/13/2020  . Recurrent falls 06/25/2019  . 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 Severe aortic stenosis   . Sleep apnea   . Spinal  stenosis of lumbar region with neurogenic claudication 01/18/2017  . 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  . Thrombocytopenia (HCC) 03/13/2020   Past Surgical History:  Procedure Laterality Date  . CARDIAC CATHETERIZATION    . CARDIAC SURGERY    . CATARACT EXTRACTION, BILATERAL    . CORONARY ARTERY BYPASS GRAFT    . CORONARY STENT INTERVENTION N/A 10/07/2019   Procedure: CORONARY STENT INTERVENTION;  Surgeon: Tonny Bollman, MD;  Location: [facility] INVASIVE CV LAB;  Service: Cardiovascular;  Laterality: N/A;  . EYE SURGERY    . HEMORROIDECTOMY    . RIGHT/LEFT HEART CATH AND CORONARY/GRAFT ANGIOGRAPHY N/A 10/07/2019   Procedure: RIGHT/LEFT HEART CATH AND CORONARY/GRAFT ANGIOGRAPHY;  Surgeon: Tonny Bollman, MD;  Location: [facility] INVASIVE CV LAB;  Service: Cardiovascular;  Laterality: N/A;  . TEE WITHOUT CARDIOVERSION N/A 10/27/2019   Procedure: TRANSESOPHAGEAL ECHOCARDIOGRAM (TEE);  Surgeon: Kathleene Hazel, MD;  Location: [facility] INVASIVE CV LAB;  Service: Open Heart Surgery;  Laterality: N/A;  . TRANSCATHETER AORTIC VALVE REPLACEMENT, TRANSFEMORAL N/A 10/27/2019   Procedure: TRANSCATHETER AORTIC VALVE REPLACEMENT, TRANSFEMORAL;  Surgeon: Kathleene Hazel, MD;  Location: [facility] INVASIVE CV LAB;  Service: Open Heart Surgery;  Laterality: N/A;   Family History  Problem Relation Age of Onset  . Cancer Mother   . Diabetes Mother   . Kidney disease Mother   . Stroke Mother   . Sleep disorder Mother   . Arthritis Father   . Migraines Father   . Hypertension Father   . Sleep disorder Father   . Arthritis Brother   . Cancer Brother    Social History   Socioeconomic History  . Marital status: Widowed    Spouse name: Dorann Lodge  . Number of children: 2  Occupational History  . 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 Smoking status: Former Smoker    Types: Cigarettes, Cigars    Quit  date: 05/02/1962    Years since quitting: 58.2  . Smokeless tobacco: Never Used  Vaping Use  . Vaping Use: Never used  Substance and Sexual Activity  . Alcohol use: No  . Drug use: No  . Sexual activity: Not Currently  Other Topics Concern  . Not on file  Social History Narrative   Wife passed away apx 31-Aug-2015, son passed away one year later.  He has one son that lives with him and one daughter that  lives beside of him. He is close with his family.  His sister cooks meals for him regularly.  He has friends at the cafe he visits regularly.   Social Determinants of Health   Financial Resource Strain: Low Risk   . Difficulty of Paying Living Expenses: Not hard at all  Food Insecurity: No Food Insecurity  . Worried About Programme researcher, broadcasting/film/video in the Last Year: Never true  . Ran Out of Food in the Last Year: Never true  Transportation Needs: No Transportation Needs  . Lack of Transportation (Medical): No  . Lack of Transportation (Non-Medical): No  Physical Activity: Insufficiently Active  . Days of Exercise per Week: 2 days  . Minutes of Exercise per Session: 10 min  Stress: No Stress Concern Present  . Feeling of Stress : Not at all  Social Connections: Moderately Isolated  . Frequency of Communication with Friends and Family: More than three times a week  . Frequency of Social Gatherings with Friends and Family: More than three times a week  . Attends Religious Services: 1 to 4 times per year  . Active Member of Clubs or Organizations: No  . Attends Banker Meetings: Never  . Marital Status: Widowed    Tobacco Counseling Counseling given: No   Clinical Intake:  Pre-visit preparation completed: Yes  Pain : 0-10 Pain Score: 2  Pain Type: Neuropathic pain Pain Location: Hand Pain Orientation: Left Pain Descriptors / Indicators: Aching Pain Frequency: Intermittent Pain Relieving Factors: Tylenol and Gabapentin Effect of Pain on Daily Activities: moderate Pain  Relieving Factors: Tylenol and Gabapentin BMI - recorded: 27.46 Nutritional Status: BMI 25 -29 Overweight Nutritional Risks: None Diabetes: No How often do you need to have someone help you when you read instructions, pamphlets, or other written materials from your doctor or pharmacy?: 2 - Rarely  Interpreter Needed?: No    Activities of Daily Living In your present state of health, do you have any difficulty performing the following activities: 07/13/2020 03/14/2020  Hearing? Malvin Johns  Comment HOH from many years using loud equipment -  Vision? N N  Difficulty concentrating or making decisions? N N  Walking or climbing stairs? Y N  Comment patient is offbalance, has neuropathy, and walks with a limp -  Dressing or bathing? N N  Doing errands, shopping? N N  Preparing Food and eating ? N -  Using the Toilet? N -  In the past six months, have you accidently leaked urine? N -  Do you have problems with loss of bowel control? N -  Managing your Medications? N -  Managing your Finances? N -  Housekeeping or managing your Housekeeping? N -  Some recent data might be hidden    Patient Care Team: Abigail Miyamoto, MD as PCP - General (Family Medicine) Georgeanna Lea, MD as PCP - Cardiology (Cardiology) Earvin Hansen, [facility] as Pharmacist (Pharmacist)     Assessment:   This is a routine wellness examination for Jack Hodges.  Dietary issues and exercise activities discussed: Current Exercise Habits: Home exercise routine, Type of exercise: walking, Time (Minutes): 10, Frequency (Times/Week): 4, Weekly Exercise (Minutes/Week): 40, Intensity: Mild, Exercise limited by: Other - see comments (balance and instability)  Goals    . Pharmacy Care Plan     CARE PLAN ENTRY  Current Barriers:  . Chronic Disease Management support, education, and care coordination needs related to Hypertension and Hyperlipidemia   Hypertension . Pharmacist Clinical Goal(s): o Over the  next 90 days, patient  will work with PharmD and providers to maintain BP goal <140/90 . Current regimen:  o amlodipine 10 mg daily o enalapril 20 mg daily o Potassium Chloride 10meq daily . Interventions: o Recommend patient continue taking medications as prescribed.  o Reviewed home blood pressure readings.  . Patient self care activities - Over the next 90 days, patient will: o Check BP daily, document, and provide at future appointments o Ensure daily salt intake < 2300 mg/day  Hyperlipidemia . Pharmacist Clinical Goal(s): o Over the next 90 days, patient will work with PharmD and providers to maintain LDL goal < 70 . Current regimen:  o zetia 10 mg o aspirin ec 81 mg o Clopidogrel 75 mg daily o rosuvastatin 20 mg daily. . Interventions: o Recommend patient continue to take medications as prescribed.  . Patient self care activities - Over the next 90 days, patient will: o Continue to follow-up with Dr. Marina GoodellPerry and cardiology and contact providers as needed. .   Medication management . Pharmacist Clinical Goal(s): o Over the next 90 days, patient will work with PharmD and providers to achieve optimal medication adherence . Current pharmacy: [facility] . Interventions o Comprehensive medication review performed. o Continue current medication management strategy . Patient self care activities - Over the next 90 days, patient will: o Focus on medication adherence by continuing to take medications as prescribed.  o Take medications as prescribed o Report any questions or concerns to PharmD and/or provider(s)  Please see past updates related to this goal by clicking on the "Past Updates" button in the selected goal         Depression Screen PHQ 2/9 Scores 07/13/2020 08/05/2019  PHQ - 2 Score 2 2  PHQ- 9 Score 2 -    Fall Risk Fall Risk  07/13/2020 05/19/2019 08/23/2017  Falls in the past year? 1 1 Yes  Number falls in past yr: 1 0 2 or more  Injury with Fall? 0 1 Yes  Risk Factor Category   - - High Fall Risk  Risk for fall due to : History of fall(s);Impaired balance/gait Impaired balance/gait;Impaired mobility -  Follow up Falls evaluation completed;Education provided;Falls prevention discussed Education provided -    FALL RISK PREVENTION PERTAINING TO THE HOME:  Any stairs in or around the home? Yes  If so, are there any without handrails? No  Home free of loose throw rugs in walkways, pet beds, electrical cords, etc? Yes  Adequate lighting in your home to reduce risk of falls? Yes   ASSISTIVE DEVICES UTILIZED TO PREVENT FALLS:  Life alert? No  Use of a cane, walker or w/c? Yes  Grab bars in the bathroom? Yes  Shower chair or bench in shower? No  Elevated toilet seat or a handicapped toilet? No  Gait slow and steady with assistive device  Cognitive Function:     6CIT Screen 07/13/2020  What Year? 0 points  What month? 0 points  What time? 0 points  Count back from 20 0 points  Months in reverse 2 points  Repeat phrase 0 points  Total Score 2    Immunizations Immunization History  Administered Date(s) Administered  . Fluad Quad(high Dose 65+) 12/10/2018, 01/25/2020  . Pneumococcal Conjugate-13 12/20/2017  . Pneumococcal Polysaccharide-23 03/20/2012  . Tdap 07/03/2019    TDAP status: Up to date  Flu Vaccine status: Up to date  Pneumococcal vaccine status: Up to date  Covid-19 vaccine status: Declined, Education has been provided  regarding the importance of this vaccine but patient still declined. Advised may receive this vaccine at local pharmacy or Health Dept.or vaccine clinic. Aware to provide a copy of the vaccination record if obtained from local pharmacy or Health Dept. Verbalized acceptance and understanding.  Screening Tests Health Maintenance  Topic Date Due  . COVID-19 Vaccine (1) 07/29/2020 (Originally 03/08/1936)  . INFLUENZA VACCINE  10/24/2020  . TETANUS/TDAP  07/02/2029  . PNA vac Low Risk Adult  Completed  . HPV VACCINES  Aged  Out    Health Maintenance  There are no preventive care reminders to display for this patient.  Colorectal cancer screening: No longer required.   Lung Cancer Screening: (Low Dose CT Chest recommended if Age 8-80 years, 30 pack-year currently smoking OR have quit w/in 15years.) does not qualify.   Additional Screening:  Vision Screening: Recommended annual ophthalmology exams for early detection of glaucoma and other disorders of the eye. Is the patient up to date with their annual eye exam?  No  If pt is not established with a provider, would they like to be referred to a provider to establish care? Yes .   Dental Screening: Recommended annual dental exams for proper oral hygiene    Plan:     1- Advance Directive - Patient states that his son, [name] is his POA.  He lives with him.  He has advance directives and will bring [place] a copy when he finds it.   2- Fall precautions discussed - high fall risk however no recent falls 3- COVID Vaccine recommended, patient declined 4- Referral to [facility] - patient has not had an eye exam in a few years, he complains of increased eye watering 5- Medication list updated, patient brought in his meds to verify  I have personally reviewed and noted the following in the patient's chart:   . Medical and social history . Use of alcohol, tobacco or illicit drugs  . Current medications and supplements . Functional ability and status . Nutritional status . Physical activity . Advanced directives . List of other physicians . Hospitalizations, surgeries, and ER visits in previous 12 months . Vitals . Screenings to include cognitive, depression, and falls . Referrals and appointments  In addition, I have reviewed and discussed with patient certain preventive protocols, quality metrics, and best practice recommendations. A written personalized care plan for preventive services as well as general preventive health recommendations were provided to  patient.     Jacklynn Bue, LPN   3/67/2550

## 2020-07-13 NOTE — Telephone Encounter (Signed)
phone call

## 2020-07-13 NOTE — Progress Notes (Signed)
    Chronic Care Management Pharmacy Assistant   Name: Jack Hodges  MRN: [id number] DOB: 14-Nov-1930   Reason for Encounter: Medication Review for dosing clarification    Medications: Outpatient Encounter Medications as of 07/13/2020  Medication Sig  . acetaminophen (TYLENOL) 650 MG CR tablet Take 650 mg by mouth every 8 (eight) hours as needed for pain.   Marland Kitchen ascorbic acid (VITAMIN C) 500 MG tablet Take 1 tablet (500 mg total) by mouth daily.  Marland Kitchen aspirin EC 81 MG tablet Take 81 mg by mouth daily.  . cholecalciferol (VITAMIN D) 25 MCG (1000 UNIT) tablet Take 1,000 Units by mouth daily.  . clopidogrel (PLAVIX) 75 MG tablet Take 1 tablet (75 mg total) by mouth at bedtime.  . gabapentin (NEURONTIN) 300 MG capsule Take 1 capsule (300 mg total) by mouth 2 (two) times daily.  Marland Kitchen lisinopril (ZESTRIL) 10 MG tablet Take 0.5 tablets (5 mg total) by mouth daily.  . meclizine (ANTIVERT) 25 MG tablet Take 1 tablet (25 mg total) by mouth 3 (three) times daily as needed for dizziness.  . metoprolol succinate (TOPROL XL) 25 MG 24 hr tablet Take 1 tablet (25 mg total) by mouth daily. (Patient not taking: Reported on 07/13/2020)  . metoprolol succinate (TOPROL XL) 25 MG 24 hr tablet Take 1 tablet (25 mg total) by mouth daily. (Patient not taking: Reported on 07/13/2020)  . nitroGLYCERIN (NITROSTAT) 0.4 MG SL tablet Place 1 tablet (0.4 mg total) under the tongue every 5 (five) minutes as needed for chest pain.  . pantoprazole (PROTONIX) 40 MG tablet TAKE 1 TABLET BY MOUTH  DAILY (Patient taking differently: Take 40 mg by mouth daily.)  . rosuvastatin (CRESTOR) 20 MG tablet TAKE 1 TABLET BY MOUTH AT  BEDTIME (Patient taking differently: Take 20 mg by mouth at bedtime.)  . sertraline (ZOLOFT) 50 MG tablet Take 1 tablet (50 mg total) by mouth daily.  . vitamin B-12 (CYANOCOBALAMIN) 500 MCG tablet Take 500 mcg by mouth daily. (Patient not taking: Reported on 07/13/2020)  . zinc sulfate 220 (50 Zn) MG capsule Take 1  capsule (220 mg total) by mouth daily.  . [DISCONTINUED] albuterol (VENTOLIN HFA) 108 (90 Base) MCG/ACT inhaler Inhale 2 puffs into the lungs every 2 (two) hours as needed for wheezing or shortness of breath.   No facility-administered encounter medications on file as of 07/13/2020.     Jack Hodges, CPP and Dr. Marina Goodell wanted to clarify with the patient as to what medications to stop taking.  I called the patient and let him know to stop the following:  Patient understood Furosemide Potassium  I will call the patients mail order pharmacy and request the following medications be refilled and sent to the patient, at the request of Jack Hodges, CPP and Dr. Marina Goodell Metoprolol  Per representative is not due till 09/08/20 Plavix         Not due till 08/13/20 Sertraline     Was being shipped today  I have notified Jack Hodges, CPP  Leilani Able, [facility] Clinical Pharmacist Assistant [phone]

## 2020-07-13 NOTE — Patient Instructions (Signed)
 Fall Prevention in the Home, Adult Falls can cause injuries and can happen to people of all ages. There are many things you can do to make your home safe and to help prevent falls. Ask for help when making these changes. What actions can I take to prevent falls? General Instructions  Use good lighting in all rooms. Replace any light bulbs that burn out.  Turn on the lights in dark areas. Use night-lights.  Keep items that you use often in easy-to-reach places. Lower the shelves around your home if needed.  Set up your furniture so you have a clear path. Avoid moving your furniture around.  Do not have throw rugs or other things on the floor that can make you trip.  Avoid walking on wet floors.  If any of your floors are uneven, fix them.  Add color or contrast paint or tape to clearly mark and help you see: ? Grab bars or handrails. ? First and last steps of staircases. ? Where the edge of each step is.  If you use a stepladder: ? Make sure that it is fully opened. Do not climb a closed stepladder. ? Make sure the sides of the stepladder are locked in place. ? Ask someone to hold the stepladder while you use it.  Know where your pets are when moving through your home. What can I do in the bathroom?  Keep the floor dry. Clean up any water on the floor right away.  Remove soap buildup in the tub or shower.  Use nonskid mats or decals on the floor of the tub or shower.  Attach bath mats securely with double-sided, nonslip rug tape.  If you need to sit down in the shower, use a plastic, nonslip stool.  Install grab bars by the toilet and in the tub and shower. Do not use towel bars as grab bars.      What can I do in the bedroom?  Make sure that you have a light by your bed that is easy to reach.  Do not use any sheets or blankets for your bed that hang to the floor.  Have a firm chair with side arms that you can use for support when you get dressed. What can I do  in the kitchen?  Clean up any spills right away.  If you need to reach something above you, use a step stool with a grab bar.  Keep electrical cords out of the way.  Do not use floor polish or wax that makes floors slippery. What can I do with my stairs?  Do not leave any items on the stairs.  Make sure that you have a light switch at the top and the bottom of the stairs.  Make sure that there are handrails on both sides of the stairs. Fix handrails that are broken or loose.  Install nonslip stair treads on all your stairs.  Avoid having throw rugs at the top or bottom of the stairs.  Choose a carpet that does not hide the edge of the steps on the stairs.  Check carpeting to make sure that it is firmly attached to the stairs. Fix carpet that is loose or worn. What can I do on the outside of my home?  Use bright outdoor lighting.  Fix the edges of walkways and driveways and fix any cracks.  Remove anything that might make you trip as you walk through a door, such as a raised step or threshold.  Trim   any bushes or trees on paths to your home.  Check to see if handrails are loose or broken and that both sides of all steps have handrails.  Install guardrails along the edges of any raised decks and porches.  Clear paths of anything that can make you trip, such as tools or rocks.  Have leaves, snow, or ice cleared regularly.  Use sand or salt on paths during winter.  Clean up any spills in your garage right away. This includes grease or oil spills. What other actions can I take?  Wear shoes that: ? Have a low heel. Do not wear high heels. ? Have rubber bottoms. ? Feel good on your feet and fit well. ? Are closed at the toe. Do not wear open-toe sandals.  Use tools that help you move around if needed. These include: ? Canes. ? Walkers. ? Scooters. ? Crutches.  Review your medicines with your doctor. Some medicines can make you feel dizzy. This can increase your  chance of falling. Ask your doctor what else you can do to help prevent falls. Where to find more information  Centers for Disease Control and Prevention, [employer]: www.cdc.gov  National Institute on Aging: www.nia.nih.gov Contact a doctor if:  You are afraid of falling at home.  You feel weak, drowsy, or dizzy at home.  You fall at home. Summary  There are many simple things that you can do to make your home safe and to help prevent falls.  Ways to make your home safe include removing things that can make you trip and installing grab bars in the bathroom.  Ask for help when making these changes in your home. This information is not intended to replace advice given to you by your health care provider. Make sure you discuss any questions you have with your health care provider. Document Revised: 10/14/2019 Document Reviewed: 10/14/2019 Elsevier Patient Education  2021 [employer].   Health Maintenance, Male Adopting a healthy lifestyle and getting preventive care are important in promoting health and wellness. Ask your health care provider about:  The right schedule for you to have regular tests and exams.  Things you can do on your own to prevent diseases and keep yourself healthy. What should I know about diet, weight, and exercise? Eat a healthy diet  Eat a diet that includes plenty of vegetables, fruits, low-fat dairy products, and lean protein.  Do not eat a lot of foods that are high in solid fats, added sugars, or sodium.   Maintain a healthy weight Body mass index (BMI) is a measurement that can be used to identify possible weight problems. It estimates body fat based on height and weight. Your health care provider can help determine your BMI and help you achieve or maintain a healthy weight. Get regular exercise Get regular exercise. This is one of the most important things you can do for your health. Most adults should:  Exercise for at least 150 minutes each week.  The exercise should increase your heart rate and make you sweat (moderate-intensity exercise).  Do strengthening exercises at least twice a week. This is in addition to the moderate-intensity exercise.  Spend less time sitting. Even light physical activity can be beneficial. Watch cholesterol and blood lipids Have your blood tested for lipids and cholesterol at 85 years of age, then have this test every 5 years. You may need to have your cholesterol levels checked more often if:  Your lipid or cholesterol levels are high.  You are   older than 85 years of age.  You are at high risk for heart disease. What should I know about cancer screening? Many types of cancers can be detected early and may often be prevented. Depending on your health history and family history, you may need to have cancer screening at various ages. This may include screening for:  Colorectal cancer.  Prostate cancer.  Skin cancer.  Lung cancer. What should I know about heart disease, diabetes, and high blood pressure? Blood pressure and heart disease  High blood pressure causes heart disease and increases the risk of stroke. This is more likely to develop in people who have high blood pressure readings, are of African descent, or are overweight.  Talk with your health care provider about your target blood pressure readings.  Have your blood pressure checked: ? Every 3-5 years if you are 18-39 years of age. ? Every year if you are 40 years old or older.  If you are between the ages of 65 and 75 and are a current or former smoker, ask your health care provider if you should have a one-time screening for abdominal aortic aneurysm (AAA). Diabetes Have regular diabetes screenings. This checks your fasting blood sugar level. Have the screening done:  Once every three years after age 45 if you are at a normal weight and have a low risk for diabetes.  More often and at a younger age if you are overweight or have a  high risk for diabetes. What should I know about preventing infection? Hepatitis B If you have a higher risk for hepatitis B, you should be screened for this virus. Talk with your health care provider to find out if you are at risk for hepatitis B infection. Hepatitis C Blood testing is recommended for:  Everyone born from 1945 through 1965.  Anyone with known risk factors for hepatitis C. Sexually transmitted infections (STIs)  You should be screened each year for STIs, including gonorrhea and chlamydia, if: ? You are sexually active and are younger than 85 years of age. ? You are older than 85 years of age and your health care provider tells you that you are at risk for this type of infection. ? Your sexual activity has changed since you were last screened, and you are at increased risk for chlamydia or gonorrhea. Ask your health care provider if you are at risk.  Ask your health care provider about whether you are at high risk for HIV. Your health care provider may recommend a prescription medicine to help prevent HIV infection. If you choose to take medicine to prevent HIV, you should first get tested for HIV. You should then be tested every 3 months for as long as you are taking the medicine. Follow these instructions at home: Lifestyle  Do not use any products that contain nicotine or tobacco, such as cigarettes, e-cigarettes, and chewing tobacco. If you need help quitting, ask your health care provider.  Do not use street drugs.  Do not share needles.  Ask your health care provider for help if you need support or information about quitting drugs. Alcohol use  Do not drink alcohol if your health care provider tells you not to drink.  If you drink alcohol: ? Limit how much you have to 0-2 drinks a day. ? Be aware of how much alcohol is in your drink. In the [place] one drink equals one 12 oz bottle of beer (355 mL), one 5 oz glass of wine (148 mL), or   one 1 oz glass of hard  liquor (44 mL). General instructions  Schedule regular health, dental, and eye exams.  Stay current with your vaccines.  Tell your health care provider if: ? You often feel depressed. ? You have ever been abused or do not feel safe at home. Summary  Adopting a healthy lifestyle and getting preventive care are important in promoting health and wellness.  Follow your health care provider's instructions about healthy diet, exercising, and getting tested or screened for diseases.  Follow your health care provider's instructions on monitoring your cholesterol and blood pressure. This information is not intended to replace advice given to you by your health care provider. Make sure you discuss any questions you have with your health care provider. Document Revised: 03/05/2018 Document Reviewed: 03/05/2018 Elsevier Patient Education  2021 [employer] Inc.  

## 2020-07-18 ENCOUNTER — Telehealth: Payer: Self-pay

## 2020-07-18 NOTE — Progress Notes (Signed)
    Chronic Care Management Pharmacy Assistant   Name: CHAYCE ROBBINS  MRN: [id number] DOB: 09-08-1930   Closed encounter. Patient had appointment with Juliane Lack, [facility] on 07/13/2020. Will follow-up with patient mid to late May.     Josiah Lobo, CMA  [phone] Clinical Pharmacist Assistant

## 2020-07-25 ENCOUNTER — Other Ambulatory Visit: Payer: Self-pay

## 2020-07-25 DIAGNOSIS — I63139 Cerebral infarction due to embolism of unspecified carotid artery: Secondary | ICD-10-CM

## 2020-07-25 DIAGNOSIS — K2100 Gastro-esophageal reflux disease with esophagitis, without bleeding: Secondary | ICD-10-CM

## 2020-07-25 MED ORDER — PANTOPRAZOLE SODIUM 40 MG PO TBEC: 1.0000 | DELAYED_RELEASE_TABLET | Freq: Every day | ORAL | 2 refills | Status: DC

## 2020-07-25 MED ORDER — CLOPIDOGREL BISULFATE 75 MG PO TABS: 75.0000 mg | ORAL_TABLET | Freq: Every evening | ORAL | 6 refills | Status: DC

## 2020-07-25 NOTE — Progress Notes (Signed)
Chronic Care Management Pharmacy Note  07/27/2020 Name:  Jack Hodges MRN:  [id number] DOB:  26-May-1930   Plan Updates:   Patient now has all of his medications and reports blood pressure well controlled. Patient had run out of pantoprazole and clopidogrel earlier this week and confirms picking up at local pharmacy and taking as prescribed.   Subjective: Jack Hodges is an 87 y.o. year old male who is a primary patient of Henrene Pastor, Zeb Comfort, MD.  The CCM team was consulted for assistance with disease management and care coordination needs.    Engaged with patient by telephone for follow up visit in response to provider referral for pharmacy case management and/or care coordination services.   Consent to Services:  The patient was given the following information about Chronic Care Management services today, agreed to services, and gave verbal consent: 1. CCM service includes personalized support from designated clinical staff supervised by the primary care provider, including individualized plan of care and coordination with other care providers 2. 24/7 contact phone numbers for assistance for urgent and routine care needs. 3. Service will only be billed when office clinical staff spend 20 minutes or more in a month to coordinate care. 4. Only one practitioner may furnish and bill the service in a calendar month. 5.The patient may stop CCM services at any time (effective at the end of the month) by phone call to the office staff. 6. The patient will be responsible for cost sharing (co-pay) of up to 20% of the service fee (after annual deductible is met). Patient agreed to services and consent obtained.  Patient Care Team: Lillard Anes, MD as PCP - General (Family Medicine) Park Liter, MD as PCP - Cardiology (Cardiology) Burnice Logan, [facility] as Pharmacist (Pharmacist)  Recent office visits: 07/13/2020 - AWV with nursing.  05/09/2020 - no med changes noted.  Recent  consult visits: 07/07/2020 - cardio - metoprolol 25 mg to help with tremor. Reduce lisinopril to 5 mg daily  05/26/2020 - increase lisinopril 10 mg daily.  Hospital visits:   Objective:  Lab Results  Component Value Date   CREATININE 1.03 07/07/2020   BUN 16 07/07/2020   GFRNONAA 78 05/09/2020   GFRAA 90 05/09/2020   NA 143 07/07/2020   K 4.1 07/07/2020   CALCIUM 9.3 07/07/2020   CO2 20 07/07/2020   GLUCOSE 175 (H) 07/07/2020    Lab Results  Component Value Date/Time   HGBA1C 4.8 10/23/2019 09:55 AM    Last diabetic Eye exam: No results found for: HMDIABEYEEXA  Last diabetic Foot exam: No results found for: HMDIABFOOTEX   Lab Results  Component Value Date   CHOL 139 07/07/2020   HDL 38 (L) 07/07/2020   LDLCALC 52 07/07/2020   TRIG 322 (H) 07/07/2020   CHOLHDL 3.7 07/07/2020    Hepatic Function Latest Ref Rng & Units 05/09/2020 03/17/2020 03/16/2020  Total Protein 6.0 - 8.5 g/dL 6.2 5.5(L) 5.6(L)  Albumin 3.6 - 4.6 g/dL 4.1 2.9(L) 3.1(L)  AST 0 - 40 IU/L 23 39 28  ALT 0 - 44 IU/L 12 44 27  Alk Phosphatase 44 - 121 IU/L 74 51 60  Total Bilirubin 0.0 - 1.2 mg/dL 0.6 0.8 0.9  Bilirubin, Direct 0.0 - 0.2 mg/dL - - -    No results found for: TSH, FREET4  CBC Latest Ref Rng & Units 05/09/2020 03/15/2020 03/13/2020  WBC 3.4 - 10.8 x10E3/uL 6.5 10.1 11.6(H)  Hemoglobin 13.0 - 17.7 g/dL 11.7(L)  11.1(L) 11.8(L)  Hematocrit 37.5 - 51.0 % 36.8(L) 31.4(L) 35.2(L)  Platelets 150 - 450 x10E3/uL 98(LL) 130(L) 99(L)    No results found for: VD25OH  Clinical ASCVD: Yes  The ASCVD Risk score Mikey Bussing DC Jr., et al., 2013) failed to calculate for the following reasons:   The 2013 ASCVD risk score is only valid for ages 60 to 49   The patient has a prior MI or stroke diagnosis    Depression screen [facility] 2/9 07/13/2020 08/05/2019  Decreased Interest 1 1  Down, Depressed, Hopeless 1 1  PHQ - 2 Score 2 2  Altered sleeping 0 -  Tired, decreased energy 0 -  Change in appetite 0 -   Feeling bad or failure about yourself  0 -  Trouble concentrating 0 -  Moving slowly or fidgety/restless 0 -  Suicidal thoughts 0 -  PHQ-9 Score 2 -      Social History   Tobacco Use  Smoking Status Former Smoker  . Types: Cigarettes, Cigars  . Quit date: 05/02/1962  . Years since quitting: 58.2  Smokeless Tobacco Never Used   BP Readings from Last 3 Encounters:  07/13/20 124/72  07/07/20 (!) 120/58  05/09/20 (!) 148/76   Pulse Readings from Last 3 Encounters:  07/13/20 72  07/07/20 (!) 55  05/09/20 66   Wt Readings from Last 3 Encounters:  07/13/20 165 lb (74.8 kg)  07/07/20 165 lb (74.8 kg)  05/09/20 169 lb 9.6 oz (76.9 kg)   BMI Readings from Last 3 Encounters:  07/13/20 27.46 kg/m  07/07/20 27.46 kg/m  05/09/20 28.22 kg/m    Assessment/Interventions: Review of patient past medical history, allergies, medications, health status, including review of consultants reports, laboratory and other test data, was performed as part of comprehensive evaluation and provision of chronic care management services.   SDOH:  (Social Determinants of Health) assessments and interventions performed: Yes  SDOH Screenings   Alcohol Screen: Low Risk   . Last Alcohol Screening Score (AUDIT): 0  Depression (PHQ2-9): Low Risk   . PHQ-2 Score: 2  Financial Resource Strain: Low Risk   . Difficulty of Paying Living Expenses: Not hard at all  Food Insecurity: No Food Insecurity  . Worried About Charity fundraiser in the Last Year: Never true  . Ran Out of Food in the Last Year: Never true  Housing: Low Risk   . Last Housing Risk Score: 0  Physical Activity: Insufficiently Active  . Days of Exercise per Week: 2 days  . Minutes of Exercise per Session: 10 min  Social Connections: Moderately Isolated  . Frequency of Communication with Friends and Family: More than three times a week  . Frequency of Social Gatherings with Friends and Family: More than three times a week  . Attends  Religious Services: 1 to 4 times per year  . Active Member of Clubs or Organizations: No  . Attends Archivist Meetings: Never  . Marital Status: Widowed  Stress: No Stress Concern Present  . Feeling of Stress : Not at all  Tobacco Use: Medium Risk  . Smoking Tobacco Use: Former Smoker  . Smokeless Tobacco Use: Never Used  Transportation Needs: No Transportation Needs  . Lack of Transportation (Medical): No  . Lack of Transportation (Non-Medical): No    CCM Care Plan  Allergies  Allergen Reactions  . Poison Oak Extract Rash    Medications Reviewed Today    Reviewed by Burnice Logan, [facility] (Pharmacist) on 07/27/20 at  [place] List Status: <None>  Medication Order Taking? Sig Documenting Provider Last Dose Status Informant  acetaminophen (TYLENOL) 650 MG CR tablet [id number] Yes Take 650 mg by mouth every 8 (eight) hours as needed for pain.  [provider] Taking Active Self        Discontinued 05/26/20 1436 (Completed Course)   ascorbic acid (VITAMIN C) 500 MG tablet [id number] Yes Take 1 tablet (500 mg total) by mouth daily. Allie Bossier, MD Taking Active   aspirin EC 81 MG tablet [id number] Yes Take 81 mg by mouth daily. [provider] Taking Active Self  cholecalciferol (VITAMIN D) 25 MCG (1000 UNIT) tablet [id number] Yes Take 1,000 Units by mouth daily. [provider] Taking Active   clopidogrel (PLAVIX) 75 MG tablet [id number] Yes Take 1 tablet (75 mg total) by mouth at bedtime. Lillard Anes, MD Taking Active   gabapentin (NEURONTIN) 300 MG capsule [id number] Yes Take 1 capsule (300 mg total) by mouth 2 (two) times daily. Lillard Anes, MD Taking Active Self  lisinopril (ZESTRIL) 10 MG tablet [id number] Yes Take 0.5 tablets (5 mg total) by mouth daily. Park Liter, MD Taking Active   meclizine (ANTIVERT) 25 MG tablet [id number] Yes Take 1 tablet (25 mg total) by mouth 3 (three) times daily as needed for dizziness.  Lillard Anes, MD Taking Active Self  metoprolol succinate (TOPROL XL) 25 MG 24 hr tablet [id number] Yes Take 1 tablet (25 mg total) by mouth daily. Park Liter, MD Taking Active   nitroGLYCERIN (NITROSTAT) 0.4 MG SL tablet [id number] Yes Place 1 tablet (0.4 mg total) under the tongue every 5 (five) minutes as needed for chest pain. Darreld Mclean, PA-C Taking Active Self  pantoprazole (PROTONIX) 40 MG tablet [id number] Yes Take 1 tablet (40 mg total) by mouth daily. Lillard Anes, MD Taking Active   rosuvastatin (CRESTOR) 20 MG tablet [id number] Yes TAKE 1 TABLET BY MOUTH AT  BEDTIME  Patient taking differently: Take 20 mg by mouth at bedtime.   Lillard Anes, MD Taking Active   sertraline (ZOLOFT) 50 MG tablet [id number] Yes Take 1 tablet (50 mg total) by mouth daily. Lillard Anes, MD Taking Active   vitamin B-12 (CYANOCOBALAMIN) 500 MCG tablet [id number] No Take 500 mcg by mouth daily.  Patient not taking: No sig reported   [provider] Not Taking Active   zinc sulfate 220 (50 Zn) MG capsule [id number] Yes Take 1 capsule (220 mg total) by mouth daily. Allie Bossier, MD Taking Active           Patient Active Problem List   Diagnosis Date Noted  . Sleep apnea   . Severe aortic stenosis   . Other chest pain   . Neuropathy   . Myocardial infarction ([place])   . GERD (gastroesophageal reflux disease)   . Dyspnea   . Depression   . Dementia ([place])   . Arthritis   . Acute respiratory failure with hypoxia ([place]) 03/17/2020  . Gastroenteritis due to COVID-19 virus 03/17/2020  . Generalized weakness 03/13/2020  . Thrombocytopenia ([place]) 03/13/2020  . Pneumonia due to COVID-19 virus 03/13/2020  . History of aortic stenosis 03/11/2020  . Acute bursitis of left shoulder 01/04/2020  . 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 Angina pectoris ([place]) 10/07/2019  . Atherosclerosis of aorta ([place]) 09/24/2019  . BMI  27.0-27.9,adult 09/24/2019  . Recurrent falls 06/25/2019  . Herpes zoster 05/19/2019  . Dyspnea on  exertion 06/30/2018  . Bilateral foot-drop 01/18/2017  . 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 Dizziness 04/19/2016  . CVA (cerebral vascular accident) ([place]) 04/03/2016  . DNR (do not resuscitate) 04/03/2016  . Coronary artery disease involving native coronary artery of native heart without angina pectoris 11/01/2015  . Dyslipidemia 11/01/2015  . Essential hypertension 11/01/2015  . Status post coronary artery bypass graft 11/01/2015    Immunization History  Administered Date(s) Administered  . Fluad Quad(high Dose 65+) 12/10/2018, 01/25/2020  . Pneumococcal Conjugate-13 12/20/2017  . Pneumococcal Polysaccharide-23 03/20/2012  . Tdap 07/03/2019    Conditions to be addressed/monitored:  Hypertension, Hyperlipidemia, Coronary Artery Disease and GERD  Care Plan : [place] Plan  Updates made by Burnice Logan, [place] since 07/27/2020 12:00 AM    Problem: cad, htn and hld   Priority: High  Onset Date: 07/27/2020    Goal: disease state management   Start Date: 07/27/2020  Expected End Date: 07/27/2021  This Visit's Progress: On track  Priority: High  Note:    Current Barriers:  . Unable to self administer medications as prescribed  Pharmacist Clinical Goal(s):  Marland Kitchen Patient will adhere to prescribed medication regimen as evidenced by fill history through collaboration with PharmD and provider.   Interventions: . 1:1 collaboration with Lillard Anes, MD regarding development and update of comprehensive plan of care as evidenced by provider attestation and co-signature . Inter-disciplinary care team collaboration (see longitudinal plan of care) . Comprehensive medication review performed; medication list updated in electronic medical record  Hypertension (BP goal <130/80) -Controlled -Current treatment: . Lisinopril 5 mg daily   . Metoprolol succinate 25 mg daily  -Medications previously tried:  none reported  -Current home readings:  116/66 mmHg pulse 66  -Current dietary habits: eats with his sister -Current exercise habits: walks around house with a cane -Denies hypotensive/hypertensive symptoms -Educated on BP goals and benefits of medications for prevention of heart attack, stroke and kidney damage; Daily salt intake goal < 2300 mg; Exercise goal of 150 minutes per week; -Counseled to monitor BP at home daily, document, and provide log at future appointments -Counseled on diet and exercise extensively Recommended to continue current medication  Hyperlipidemia/CAD: (LDL goal < 55) -Controlled -Current treatment: . rosuvastatin 20 mg daily  . Aspirin 81 mg daily  . Clopidogrel 75 daily at bedtime  -Medications previously tried: none reported  -Current dietary patterns: eats with his sister some  -Current exercise habits: has foot drop and walks with a cane -Educated on Cholesterol goals;  Benefits of statin for ASCVD risk reduction; Importance of limiting foods high in cholesterol; -Counseled on diet and exercise extensively Recommended to continue current medication   Patient Goals/Self-Care Activities . Patient will:  - take medications as prescribed focus on medication adherence by calling office or pharmacy when refill is needed check blood pressure daily, document, and provide at future appointments engage in dietary modifications by limiting salt and avoiding high fat/fried foods  Follow Up Plan: Telephone follow up appointment with care management team member scheduled for: 01/2021      Medication Assistance: None required.  Patient affirms current coverage meets needs.  Patient's preferred pharmacy is:  [facility], [address] [place] [address] Phone: [phone] Fax: [phone]  [facility], [address] [place], [address] [place], [address] [place] [address] Phone:  [phone] Fax: [place] [address] [place] [address] Phone: [phone] Fax: [phone]  Uses pill box? No - tremor makes it harder to manage box Pt endorses fair compliance   We discussed: Current pharmacy is preferred with insurance plan and patient is satisfied with pharmacy services Patient decided to: Continue current medication management strategy  Care Plan and Follow Up Patient Decision:  Patient agrees to Care Plan and Follow-up.  Plan: Telephone follow up appointment with care management team member scheduled for:  01/2021

## 2020-07-25 NOTE — Telephone Encounter (Signed)
Pt needs short supply sent to local pharmacy. Has not had medications in two weeks. Will send another encounter to send 90 day supply to [facility].   Lorita Officer, CCMA 07/25/20 3:20 PM

## 2020-07-26 IMAGING — CT CT ANGIO CHEST
2 of 7 series · 16 of 36 positions shown · IV contrast (APPLIED)
Comparison: No prior chest CT. CT of the abdomen and pelvis
06/17/2019.

CLINICAL DATA: 88-year-old male under preprocedural evaluation
prior to potential transcatheter aortic valve replacement (TAVR)
procedure.

EXAM:
CT ANGIOGRAPHY CHEST, ABDOMEN AND PELVIS
TECHNIQUE: Multidetector CT imaging through the chest, abdomen and pelvis was
performed using the standard protocol during bolus administration of
intravenous contrast. Multiplanar reconstructed images and MIPs were
obtained and reviewed to evaluate the vascular anatomy.
CONTRAST:  80mL OMNIPAQUE IOHEXOL 350 MG/ML SOLN

[Series 14: ax thins · axial · 0.59mm/px · z∈[-574,+8]mm · 15 of 655 slices shown]
[im 37/655  lung]
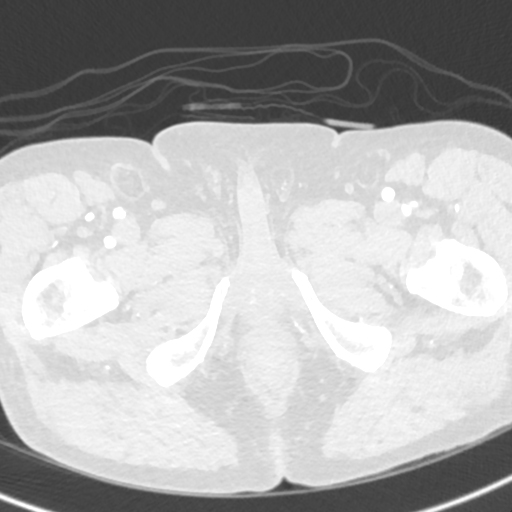
[im 73/655  mediastinal]
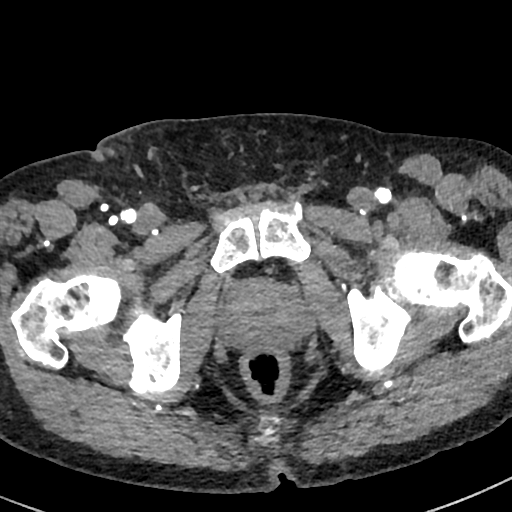
[im 110/655  lung]
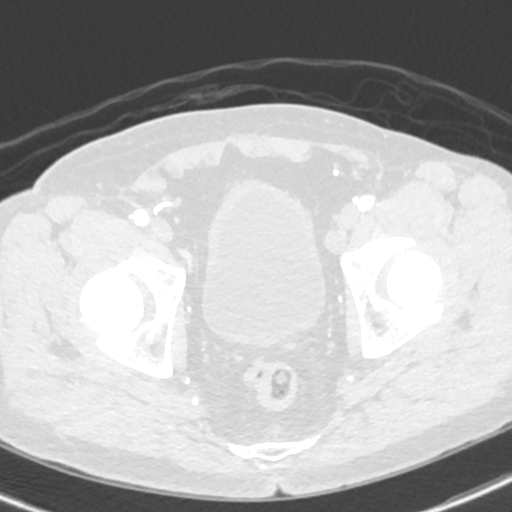
[im 146/655  mediastinal]
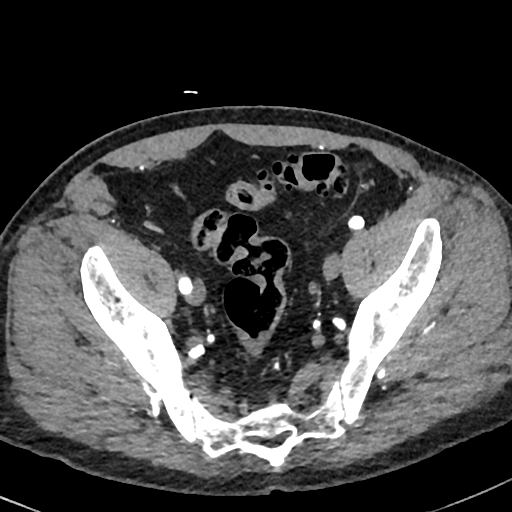
[im 219/655  lung]
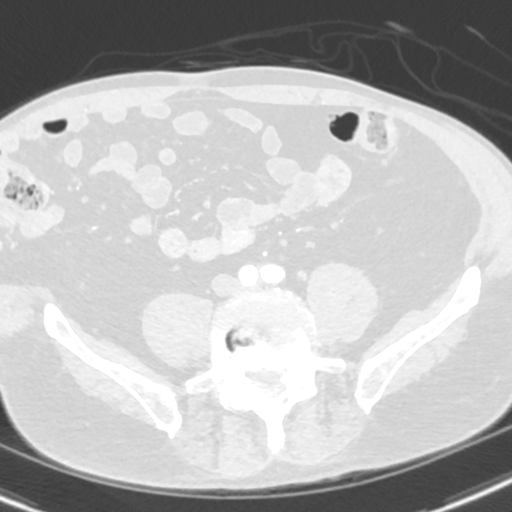
[im 255/655  mediastinal]
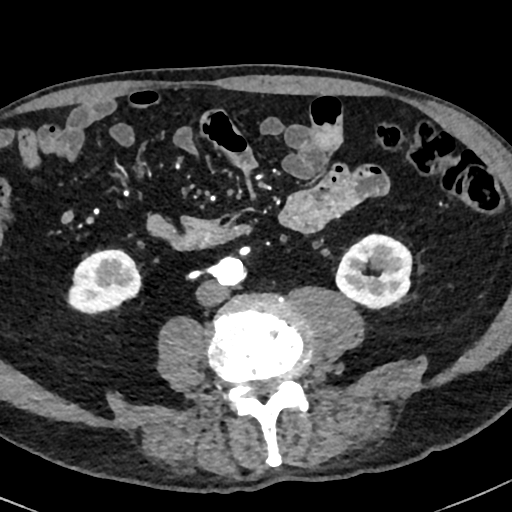
[im 291/655  lung]
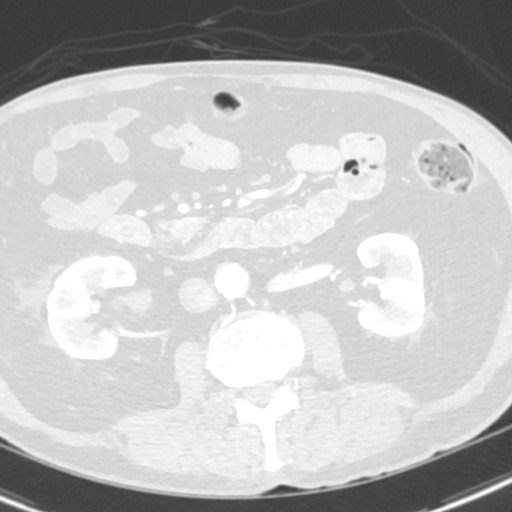
[im 328/655  mediastinal]
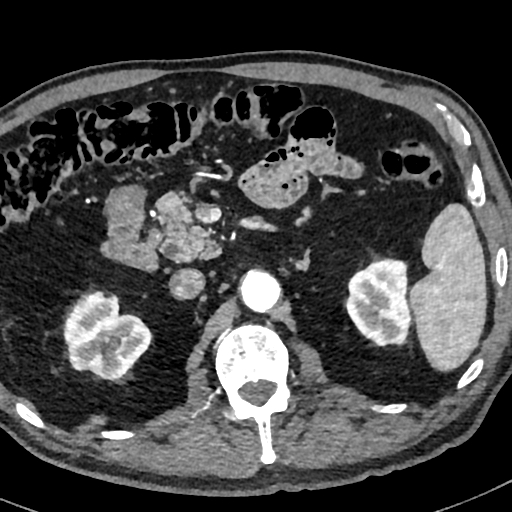
[im 364/655  lung]
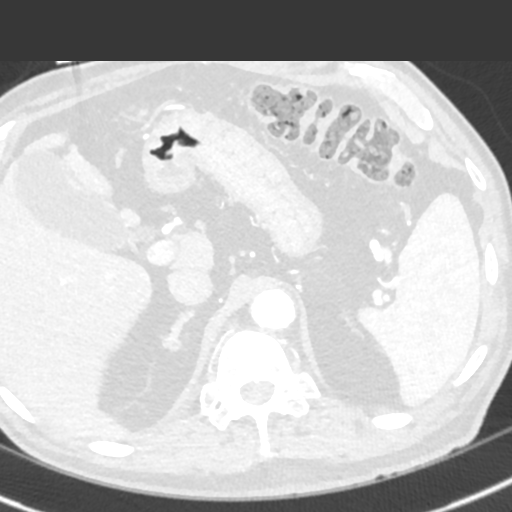
[im 400/655  mediastinal]
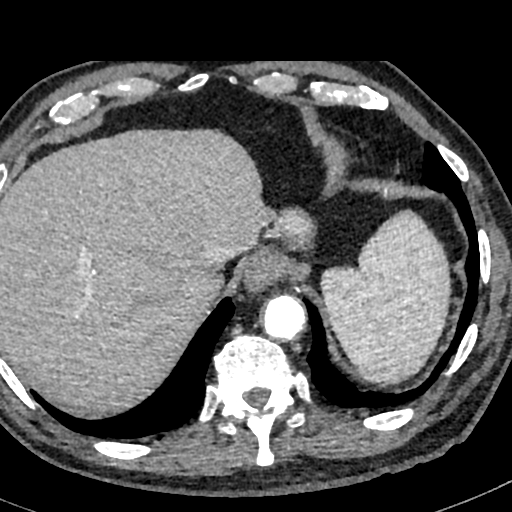
[im 437/655  lung]
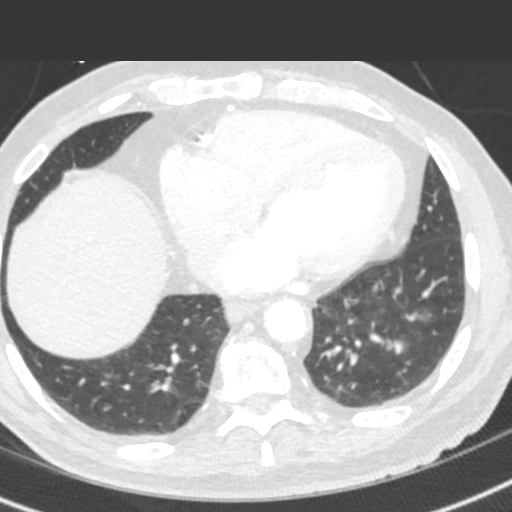
[im 509/655  mediastinal]
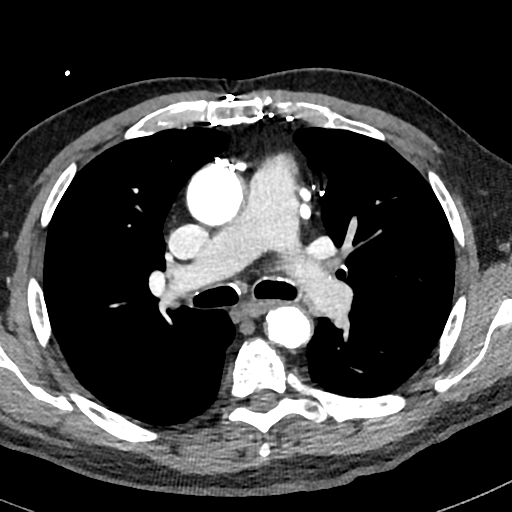
[im 546/655  lung]
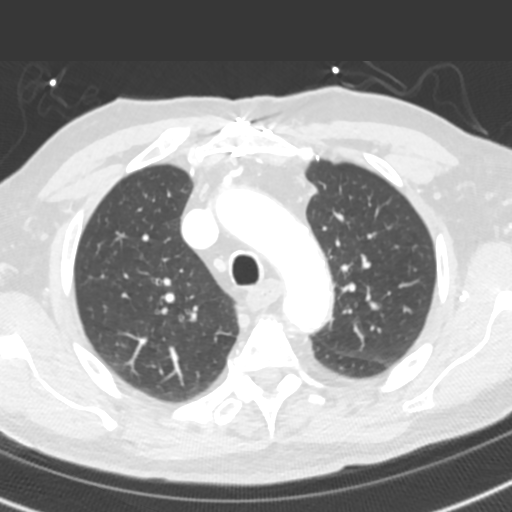
[im 582/655  mediastinal]
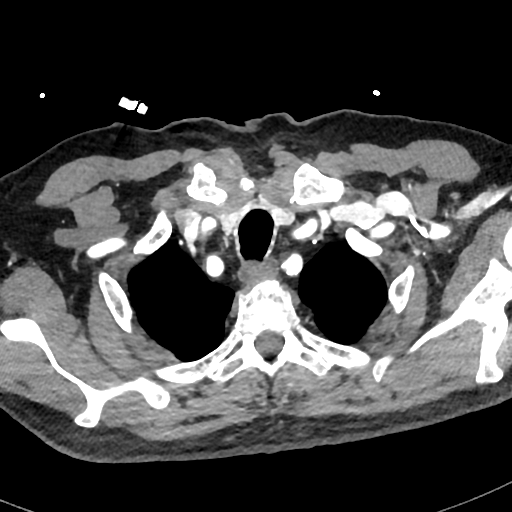
[im 618/655  lung]
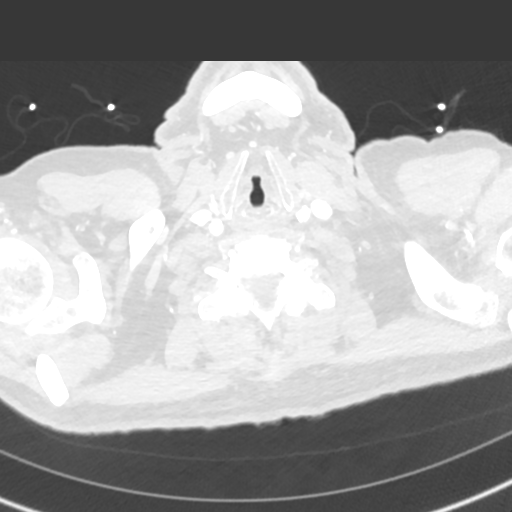

[Series 17: cor · coronal · 0.75mm/px · 1 of 148 slices shown]
[im 74/148  mediastinal]
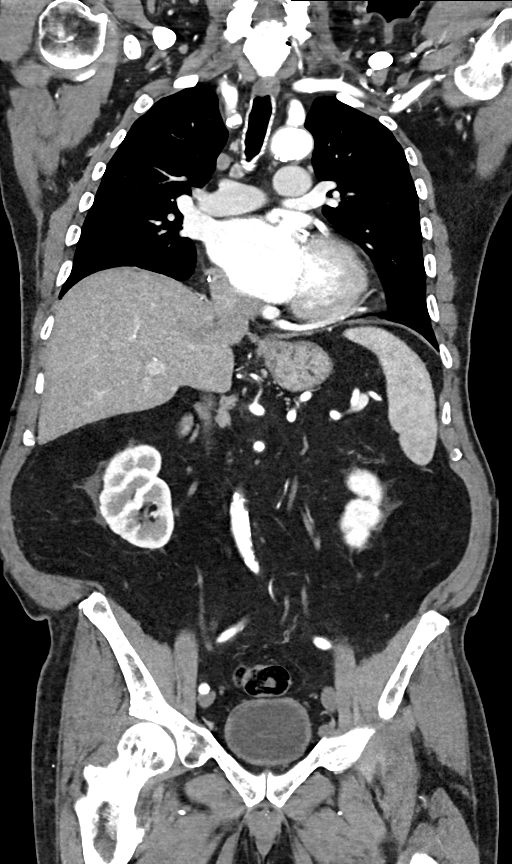

[16 of 36 positions shown; findings below may reference images not displayed]

FINDINGS: CTA CHEST FINDINGS

Cardiovascular: Heart size is normal. There is no significant
pericardial fluid, thickening or pericardial calcification. There is
aortic atherosclerosis, as well as atherosclerosis of the great
vessels of the mediastinum and the coronary arteries, including
calcified atherosclerotic plaque in the left main, left anterior
descending, left circumflex and right coronary arteries. Status post
median sternotomy for CABG including [REDACTED] to the LAD. Severe
thickening calcification of the aortic valve.

Mediastinum/Lymph Nodes: No pathologically enlarged mediastinal or
hilar lymph nodes. Esophagus is unremarkable in appearance. No
axillary lymphadenopathy.

Lungs/Pleura: No suspicious appearing pulmonary nodules or masses
are noted. No acute consolidative airspace disease. No pleural
effusions.

Musculoskeletal/Soft Tissues: Median sternotomy wires. There are no
aggressive appearing lytic or blastic lesions noted in the
visualized portions of the skeleton.

CTA ABDOMEN AND PELVIS FINDINGS

Hepatobiliary: No suspicious cystic or solid hepatic lesions. No
intra or extrahepatic biliary ductal dilatation. 2 cm gallstone
lying dependently in the gallbladder. Gallbladder is otherwise
unremarkable in appearance.

Pancreas: No pancreatic mass. No pancreatic ductal dilatation. No
pancreatic or peripancreatic fluid collections or inflammatory
changes.

Spleen: Unremarkable.

Adrenals/Urinary Tract: Bilateral kidneys and bilateral adrenal
glands are normal in appearance. No hydroureteronephrosis. Urinary
bladder is normal in appearance.

Stomach/Bowel: Normal appearance of the stomach. No pathologic
dilatation of small bowel or colon. Numerous colonic diverticulae
are noted, particularly in the sigmoid colon, without surrounding
inflammatory changes to suggest an acute diverticulitis at this
time. Normal appendix.

Vascular/Lymphatic: Aortic atherosclerosis, without evidence of
aneurysm or dissection in the abdominal or pelvic vasculature. No
lymphadenopathy noted in the abdomen or pelvis.

Reproductive: Prostate gland and seminal vesicles are unremarkable
in appearance.

Other: No significant volume of ascites.  No pneumoperitoneum.

Musculoskeletal: There are no aggressive appearing lytic or blastic
lesions noted in the visualized portions of the skeleton.

VASCULAR MEASUREMENTS PERTINENT TO TAVR:

AORTA:

Minimal Aortic Hiameter-LJ x 12 mm

Severity of Aortic Calcification-severe

RIGHT PELVIS:

Right Common Iliac Artery -

Minimal Uiameter-1.1 x 7.8 mm

Tortuosity-mild

Calcification-moderate to severe

Right External Iliac Artery -

Minimal Oiameter-6.C x 6.3 mm

Tortuosity-moderate

Calcification-mild

Right Common Femoral Artery -

Minimal 1iameter-0.0 x 6.5 mm

Tortuosity-mild

Calcification-moderate

LEFT PELVIS:

Left Common Iliac Artery -

Minimal Liameter-V.S x 8.0 mm

Tortuosity-mild

Calcification-mild

Left External Iliac Artery -

Minimal 1iameter-0.0 x 6.3 mm

Tortuosity-moderate

Calcification-mild

Left Common Femoral Artery -

Minimal 6iameter-W.X x 2.7 mm

Tortuosity-mild

Calcification-moderate

Review of the MIP images confirms the above findings.
IMPRESSION: 1. Vascular findings and measurements pertinent to potential TAVR
procedure, as detailed above.
2. Severe thickening calcification of the aortic valve, compatible
with reported clinical history of severe aortic stenosis.
3. Aortic atherosclerosis, in addition to left main and 3 vessel
coronary artery disease. Status post median sternotomy for CABG
including [REDACTED] to the LAD.
4. Colonic diverticulosis without evidence of acute diverticulitis
at this time.
5. Cholelithiasis without evidence of acute cholecystitis.

## 2020-07-27 ENCOUNTER — Other Ambulatory Visit: Payer: Self-pay

## 2020-07-27 ENCOUNTER — Ambulatory Visit (INDEPENDENT_AMBULATORY_CARE_PROVIDER_SITE_OTHER): Payer: Medicare Other

## 2020-07-27 DIAGNOSIS — E785 Hyperlipidemia, unspecified: Secondary | ICD-10-CM

## 2020-07-27 DIAGNOSIS — I10 Essential (primary) hypertension: Secondary | ICD-10-CM

## 2020-07-27 DIAGNOSIS — I2510 Atherosclerotic heart disease of native coronary artery without angina pectoris: Secondary | ICD-10-CM | POA: Diagnosis not present

## 2020-07-27 DIAGNOSIS — I700 Atherosclerosis of aorta: Secondary | ICD-10-CM

## 2020-07-27 NOTE — Patient Instructions (Addendum)
Visit Information  Goals Addressed            This Visit's Progress   . Learn More About My Health       Timeframe:  Long-Range Goal Priority:  High Start Date:                             Expected End Date:                        Follow Up Date 01/2021   - tell my story and reason for my visit - make a list of questions - ask questions - repeat what I heard to make sure I understand - bring a list of my medicines to the visit - speak up when I don't understand    Why is this important?    The best way to learn about your health and care is by talking to the doctor and nurse.   They will answer your questions and give you information in the way that you like best.    Notes:     Marland Kitchen Manage My Medicine       Timeframe:  Long-Range Goal Priority:  High Start Date:                             Expected End Date:                       Follow Up Date 01/2021  - call for medicine refill 2 or 3 days before it runs out - keep a list of all the medicines I take; vitamins and herbals too    Why is this important?   . These steps will help you keep on track with your medicines.   Notes:     . Track and Manage My Blood Pressure-Hypertension       Timeframe:  Long-Range Goal Priority:  High Start Date:                             Expected End Date:                       Follow Up Date 01/2021   - check blood pressure daily - write blood pressure results in a log or diary    Why is this important?    You won't feel high blood pressure, but it can still hurt your blood vessels.   High blood pressure can cause heart or kidney problems. It can also cause a stroke.   Making lifestyle changes like losing a little weight or eating less salt will help.   Checking your blood pressure at home and at different times of the day can help to control blood pressure.   If the doctor prescribes medicine remember to take it the way the doctor ordered.   Call the office if you  cannot afford the medicine or if there are questions about it.     Notes:       Patient Care Plan: CCM Pharmacy Care Plan    Problem Identified: cad, htn and hld   Priority: High  Onset Date: 07/27/2020    Goal: disease state management   Start Date: 07/27/2020  Expected End Date: 07/27/2021  This Visit's Progress: On track  Priority: High  Note:    Current Barriers:  . Unable to self administer medications as prescribed  Pharmacist Clinical Goal(s):  Marland Kitchen Patient will adhere to prescribed medication regimen as evidenced by fill history through collaboration with PharmD and provider.   Interventions: . 1:1 collaboration with Abigail Miyamoto, MD regarding development and update of comprehensive plan of care as evidenced by provider attestation and co-signature . Inter-disciplinary care team collaboration (see longitudinal plan of care) . Comprehensive medication review performed; medication list updated in electronic medical record  Hypertension (BP goal <130/80) -Controlled -Current treatment: . Lisinopril 5 mg daily  . Metoprolol succinate 25 mg daily  -Medications previously tried:  none reported  -Current home readings:  116/66 mmHg pulse 66  -Current dietary habits: eats with his sister -Current exercise habits: walks around house with a cane -Denies hypotensive/hypertensive symptoms -Educated on BP goals and benefits of medications for prevention of heart attack, stroke and kidney damage; Daily salt intake goal < 2300 mg; Exercise goal of 150 minutes per week; -Counseled to monitor BP at home daily, document, and provide log at future appointments -Counseled on diet and exercise extensively Recommended to continue current medication  Hyperlipidemia/CAD: (LDL goal < 55) -Controlled -Current treatment: . rosuvastatin 20 mg daily  . Aspirin 81 mg daily  . Clopidogrel 75 daily at bedtime  -Medications previously tried: none reported  -Current dietary patterns:  eats with his sister some  -Current exercise habits: has foot drop and walks with a cane -Educated on Cholesterol goals;  Benefits of statin for ASCVD risk reduction; Importance of limiting foods high in cholesterol; -Counseled on diet and exercise extensively Recommended to continue current medication   Patient Goals/Self-Care Activities . Patient will:  - take medications as prescribed focus on medication adherence by calling office or pharmacy when refill is needed check blood pressure daily, document, and provide at future appointments engage in dietary modifications by limiting salt and avoiding high fat/fried foods  Follow Up Plan: Telephone follow up appointment with care management team member scheduled for: 01/2021      The patient verbalized understanding of instructions, educational materials, and care plan provided today and declined offer to receive copy of patient instructions, educational materials, and care plan.  Telephone follow up appointment with pharmacy team member scheduled for: 01/2021  Jack Hodges, [facility]  Coronary Artery Disease, Male Coronary artery disease (CAD) is a condition in which the arteries that lead to the heart (coronary arteries) become narrow or blocked. The narrowing or blockage can lead to decreased blood flow to the heart. Prolonged reduced blood flow can cause a heart attack (myocardial infarction or MI). This condition may also be called coronary heart disease. Because CAD is the leading cause of death in men, it is important to understand what causes this condition and how it is treated. What are the causes? CAD is most often caused by atherosclerosis. This is the buildup of fat and cholesterol (plaque) on the inside of the arteries. Over time, the plaque may narrow or block the artery, reducing blood flow to the heart. Plaque can also become weak and break off within a coronary artery and cause a sudden blockage. Other less common causes of  CAD include:  A blood clot or a piece of a blood clot or other substance that blocks the flow of blood in a coronary artery (embolism).  A tearing of the artery (spontaneous coronary artery dissection).  An enlargement of an artery (aneurysm).  Inflammation (vasculitis)  in the artery wall.   What increases the risk? The following factors may make you more likely to develop this condition:  Age. Men over age 45 are at a greater risk of CAD.  Family history of CAD.  Gender. Men often develop CAD earlier in life than women.  High blood pressure (hypertension).  Diabetes.  High cholesterol levels.  Tobacco use.  Excessive alcohol use.  Lack of exercise.  A diet high in saturated and trans fats, such as fried food and processed meat. Other possible risk factors include:  High stress levels.  Depression.  Obesity.  Sleep apnea. What are the signs or symptoms? Many people do not have any symptoms during the early stages of CAD. As the condition progresses, symptoms may include:  Chest pain (angina). The pain can: ? Feel like crushing or squeezing, or like a tightness, pressure, fullness, or heaviness in the chest. ? Last more than a few minutes or can stop and recur. The pain tends to get worse with exercise or stress and to fade with rest.  Pain in the arms, neck, jaw, ear, or back.  Unexplained heartburn or indigestion.  Shortness of breath.  Nausea or vomiting.  Sudden light-headedness.  Sudden cold sweats.  Fluttering or fast heartbeat (palpitations). How is this diagnosed? This condition is diagnosed based on:  Your family and medical history.  A physical exam.  Tests, including: ? A test to check the electrical signals in your heart (electrocardiogram). ? Exercise stress test. This looks for signs of blockage when the heart is stressed with exercise, such as running on a treadmill. ? Pharmacologic stress test. This test looks for signs of blockage  when the heart is being stressed with a medicine. ? Blood tests. ? Coronary angiogram. This is a procedure to look at the coronary arteries to see if there is any blockage. During this test, a dye is injected into your arteries so they appear on an X-ray. ? Coronary artery CT scan. This CT scan helps detect calcium deposits in your coronary arteries. Calcium deposits are an indicator of CAD. ? A test that uses sound waves to take a picture of your heart (echocardiogram). ? Chest X-ray. How is this treated? This condition may be treated by:  Healthy lifestyle changes to reduce risk factors.  Medicines such as: ? Antiplatelet medicines and blood-thinning medicines, such as aspirin. These help to prevent blood clots. ? Nitroglycerin. ? Blood pressure medicines. ? Cholesterol-lowering medicine.  Coronary angioplasty and stenting. During this procedure, a thin, flexible tube is inserted through a blood vessel and into a blocked artery. A balloon or similar device on the end of the tube is inflated to open up the artery. In some cases, a small, mesh tube (stent) is inserted into the artery to keep it open.  Coronary artery bypass surgery. During this surgery, veins or arteries from other parts of the body are used to create a bypass around the blockage and allow blood to reach your heart. Follow these instructions at home: Medicines  Take over-the-counter and prescription medicines only as told by your health care provider.  Do not take the following medicines unless your health care provider approves: ? NSAIDs, such as ibuprofen, naproxen, or celecoxib. ? Vitamin supplements that contain vitamin A, vitamin E, or both. Lifestyle  Follow an exercise program approved by your health care provider. Aim for 150 minutes of moderate exercise or 75 minutes of vigorous exercise each week.  Maintain a healthy weight or lose  weight as approved by your health care provider.  Learn to manage stress or  try to limit your stress. Ask your health care provider for suggestions if you need help.  Get screened for depression and seek treatment, if needed.  Do not use any products that contain nicotine or tobacco, such as cigarettes, e-cigarettes, and chewing tobacco. If you need help quitting, ask your health care provider.  Do not use illegal drugs. Eating and drinking  Follow a heart-healthy diet. A dietitian can help educate you about healthy food options and changes. In general, eat plenty of fruits and vegetables, lean meats, and whole grains.  Avoid foods high in: ? Sugar. ? Salt (sodium). ? Saturated fat, such as processed or fatty meat. ? Trans fat, such as fried foods.  Use healthy cooking methods such as roasting, grilling, broiling, baking, poaching, steaming, or stir-frying.  Do not drink alcohol if your health care provider tells you not to drink.  If you drink alcohol: ? Limit how much you have to 0-2 drinks per day. ? Be aware of how much alcohol is in your drink. In the [place] one drink equals one 12 oz bottle of beer (355 mL), one 5 oz glass of wine (148 mL), or one 1 oz glass of hard liquor (44 mL).   General instructions  Manage any other health conditions, such as hypertension and diabetes. These conditions affect your heart.  Your health care provider may ask you to monitor your blood pressure. Ideally, your blood pressure should be below 130/80.  Keep all follow-up visits as told by your health care provider. This is important. Get help right away if:  You have pain in your chest, neck, ear, arm, jaw, stomach, or back that: ? Lasts more than a few minutes. ? Is recurring. ? Is not relieved by taking medicine under your tongue (sublingual nitroglycerin).  You have profuse sweating without cause.  You have unexplained: ? Heartburn or indigestion. ? Shortness of breath or difficulty breathing. ? Fluttering or fast heartbeat (palpitations). ? Nausea or  vomiting. ? Fatigue. ? Feelings of nervousness or anxiety. ? Weakness. ? Diarrhea.  You have sudden light-headedness or dizziness.  You faint.  You feel like hurting yourself or think about taking your own life. These symptoms may represent a serious problem that is an emergency. Do not wait to see if the symptoms will go away. Get medical help right away. Call your local emergency services (911 in the [place] Do not drive yourself to the hospital. Summary  Coronary artery disease (CAD) is a condition in which the arteries that lead to the heart (coronary arteries) become narrow or blocked. The narrowing or blockage can lead to a heart attack.  Many people do not have any symptoms during the early stages of CAD.  CAD can be treated with lifestyle changes, medicines, surgery, or a combination of these treatments. This information is not intended to replace advice given to you by your health care provider. Make sure you discuss any questions you have with your health care provider. Document Revised: 11/29/2017 Document Reviewed: 11/19/2017 Elsevier Patient Education  2021 [employer].

## 2020-08-05 ENCOUNTER — Other Ambulatory Visit: Payer: Self-pay | Admitting: Legal Medicine

## 2020-08-06 IMAGING — CR DG CHEST 2V
2 series · 2 of 2 positions shown · non-contrast
Comparison: June 17, 2019.

CLINICAL DATA: Preop for aortic valve repair.

EXAM:
CHEST - 2 VIEW

[w chest pa]
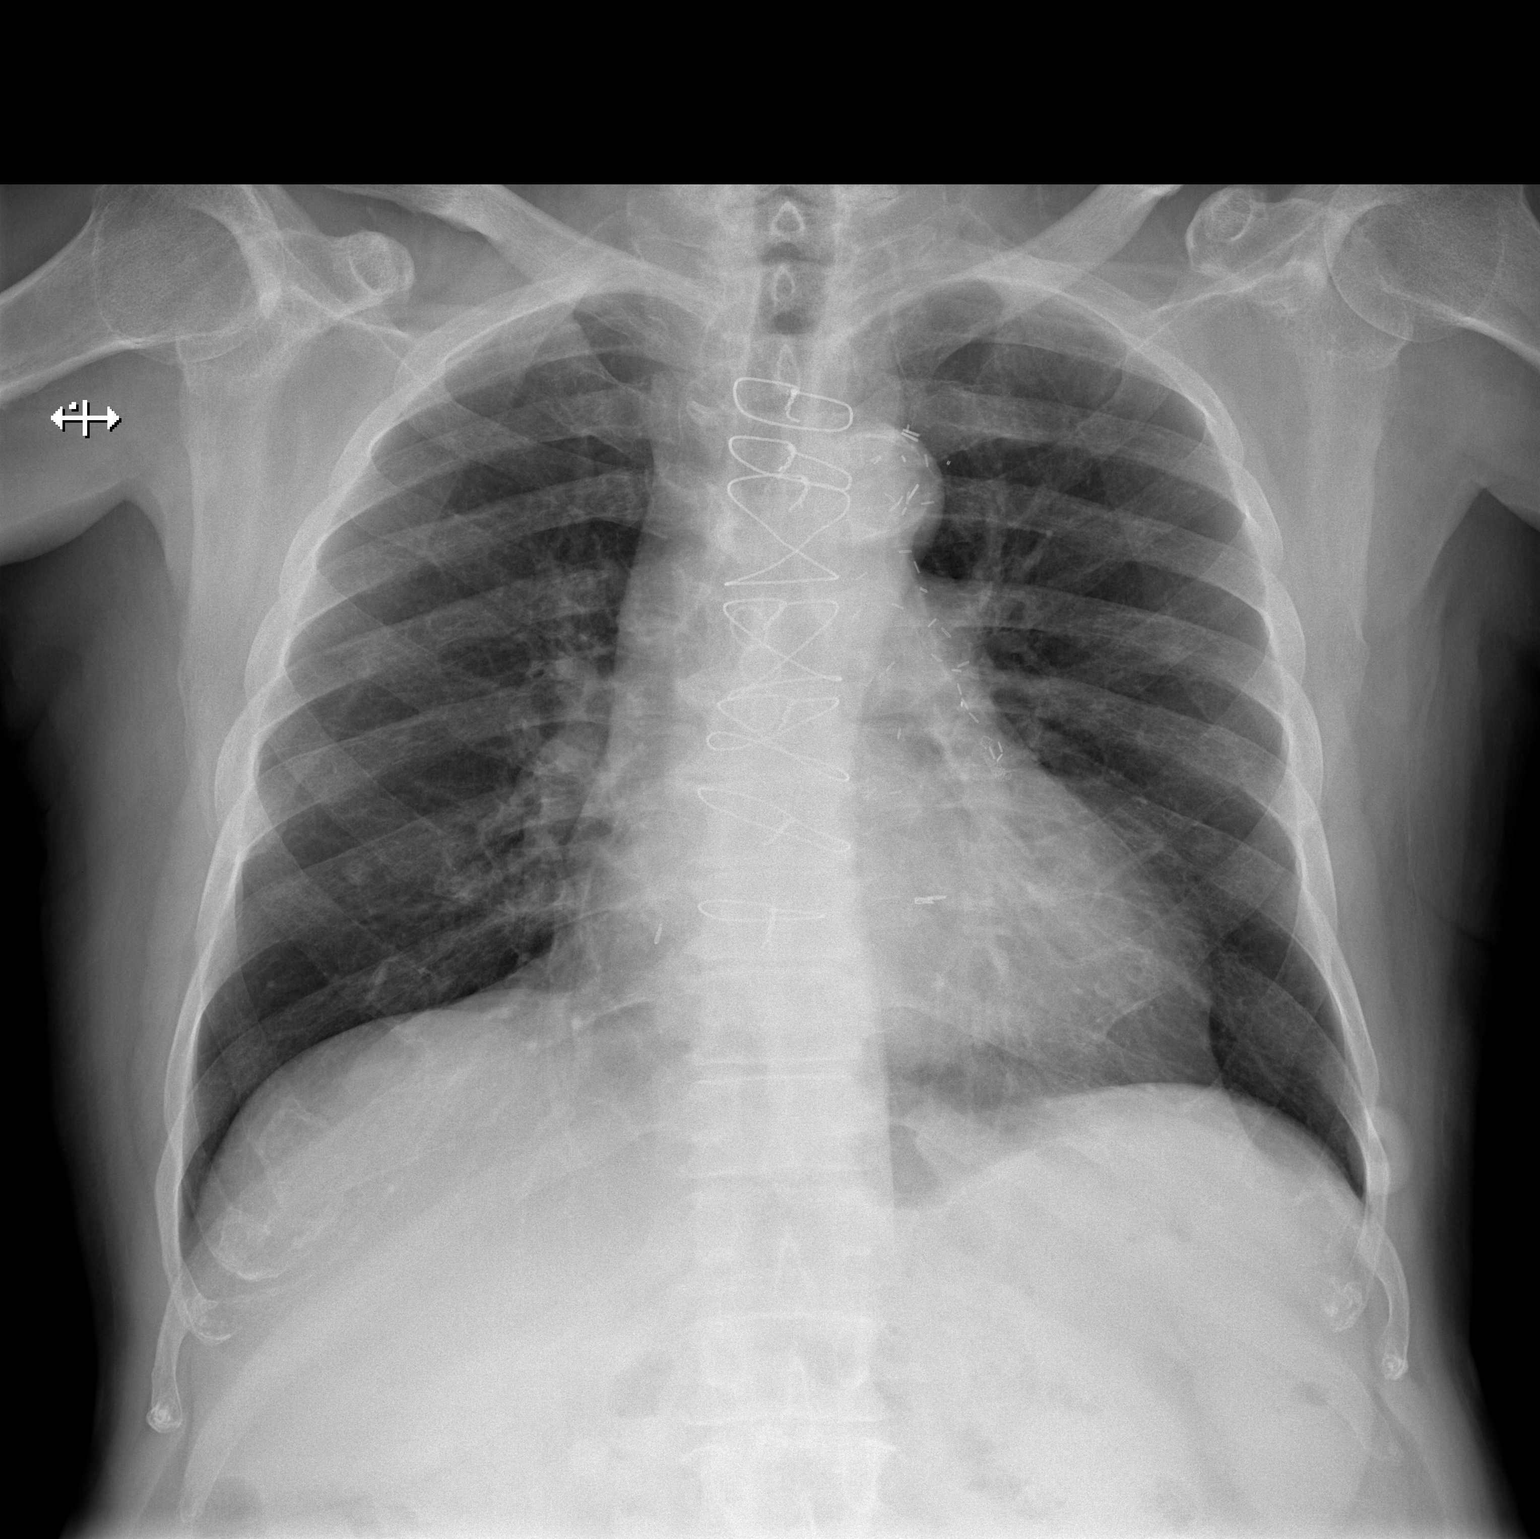

[w chest lat]
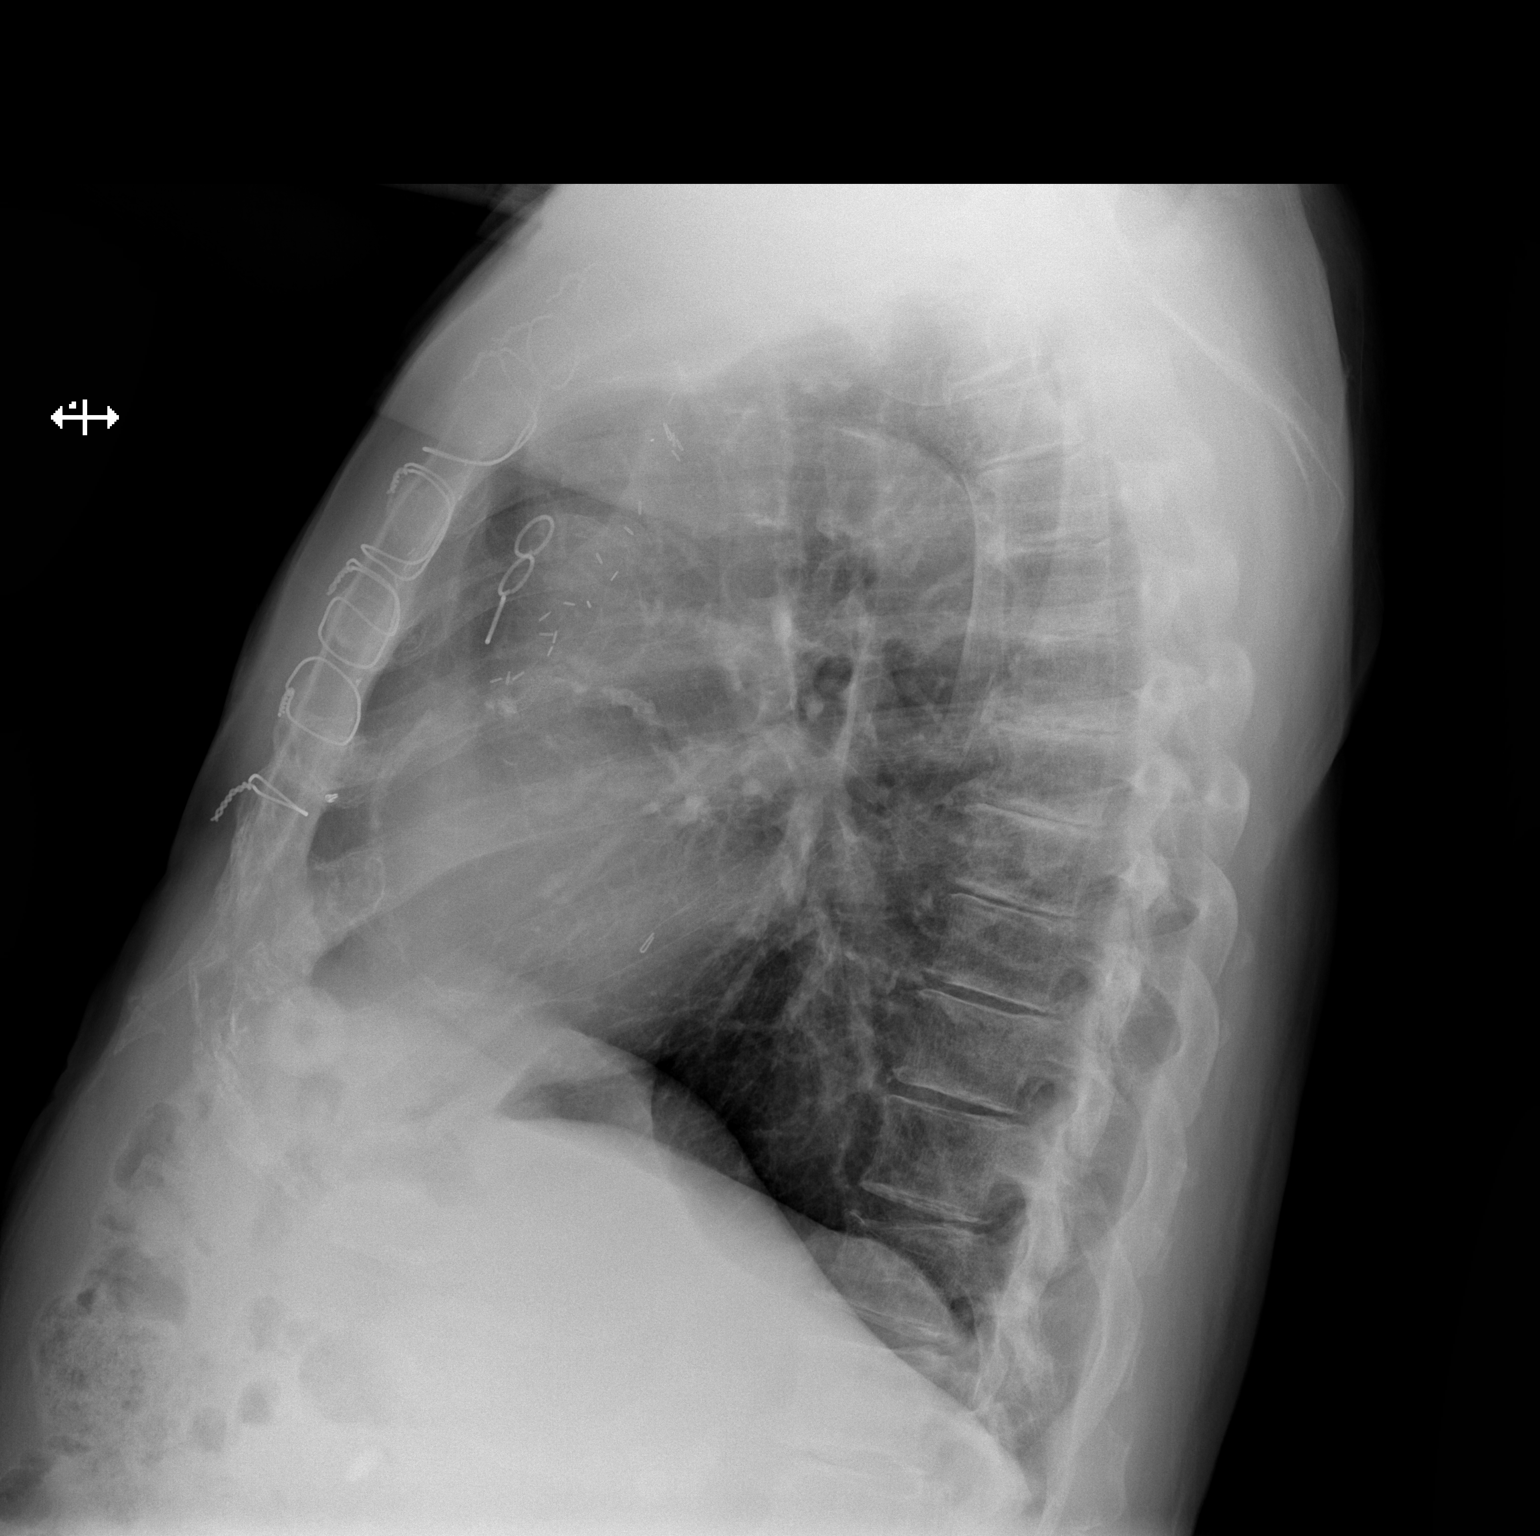

[2 of 2 positions shown; findings below may reference images not displayed]

FINDINGS: The heart size and mediastinal contours are within normal limits.
Both lungs are clear. No pneumothorax or pleural effusion is noted.
Status post coronary bypass graft. The visualized skeletal
structures are unremarkable.
IMPRESSION: No active cardiopulmonary disease.

## 2020-08-11 ENCOUNTER — Other Ambulatory Visit: Payer: Self-pay

## 2020-08-11 MED ORDER — GABAPENTIN 300 MG PO CAPS: 300.0000 mg | ORAL_CAPSULE | Freq: Two times a day (BID) | ORAL | 2 refills | Status: DC

## 2020-08-16 ENCOUNTER — Ambulatory Visit (INDEPENDENT_AMBULATORY_CARE_PROVIDER_SITE_OTHER): Payer: Medicare Other | Admitting: Legal Medicine

## 2020-08-16 ENCOUNTER — Encounter: Payer: Self-pay | Admitting: Legal Medicine

## 2020-08-16 VITALS — BP 136/74 | HR 82 | Temp 97.3°F | Ht 66.0 in | Wt 164.0 lb

## 2020-08-16 DIAGNOSIS — J180 Bronchopneumonia, unspecified organism: Secondary | ICD-10-CM | POA: Diagnosis not present

## 2020-08-16 DIAGNOSIS — G629 Polyneuropathy, unspecified: Secondary | ICD-10-CM | POA: Diagnosis not present

## 2020-08-16 DIAGNOSIS — I63139 Cerebral infarction due to embolism of unspecified carotid artery: Secondary | ICD-10-CM | POA: Diagnosis not present

## 2020-08-16 MED ORDER — AZITHROMYCIN 250 MG PO TABS: ORAL_TABLET | ORAL | 0 refills | Status: DC

## 2020-08-16 MED ORDER — GABAPENTIN 300 MG PO CAPS: 300.0000 mg | ORAL_CAPSULE | Freq: Three times a day (TID) | ORAL | 2 refills | Status: DC

## 2020-08-16 MED ORDER — CHERATUSSIN AC 100-10 MG/5ML PO SOLN: 5.0000 mL | Freq: Three times a day (TID) | ORAL | 0 refills | Status: DC | PRN

## 2020-08-16 MED ORDER — CLOPIDOGREL BISULFATE 75 MG PO TABS: 75.0000 mg | ORAL_TABLET | Freq: Every evening | ORAL | 2 refills | Status: DC

## 2020-08-16 NOTE — Progress Notes (Signed)
Acute Office Visit  Subjective:    Patient ID: Jack Hodges, male    DOB: 04/27/1930, 87 y.o.   MRN: [id number]  Chief Complaint  Patient presents with  . Cough    HPI Patient is in today for cough has been present for 1.5 weeks. Productive-whitish color. Has been taking cough syrup which has not helped much. He has been exposed to covid.  covid test negative.    Past Medical History:  Diagnosis Date  . Acute bursitis of left shoulder 01/04/2020  . Acute respiratory failure with hypoxia ([place]) 03/17/2020  . Angina pectoris ([place]) 10/07/2019  . Arthritis   . Atherosclerosis of aorta ([place]) 09/24/2019  . Bilateral foot-drop 01/18/2017  . BMI 27.0-27.9,adult 09/24/2019  . Cellulitis of left hand 07/07/2019  . Coronary artery disease involving native coronary artery of native heart without angina pectoris 11/01/2015   Overview:  Bypass surgery in 1996 Overview:  Overview:  Bypass surgery in [place]:  Continue his current regimen.  Follow up as noted.  . CVA (cerebral vascular accident) ([place]) 04/03/2016   Last Assessment & Plan:  The patient underwent extensive CVA workup.  MRI is negative.  He does have aortic stenosis by echocardiogram.  Upon review of Care everywhere he is followed by Dr. Agustin Cree, cardiologist for this.  Carotid ultrasound negative for hemodynamically significant stenosis.  Lipid panel within normal limits.  He has not had any further episodes of dizziness or nausea vomiting.   . Dementia ([place])   . Depression   . Dizziness 04/19/2016  . DNR (do not resuscitate) 04/03/2016   Overview:  I had an extensive discussion with the patient on 04/03/2016 regarding his end of life wishes.  He and his daughter agree that do not resuscitate is in keeping with his beliefs.  . Dyslipidemia 11/01/2015   Last Assessment & Plan:  Formatting of this note might be different from the original. Cholesterol 136   Triglycerides  83  HDL  50.1  LDL Calculated  69  VLDL Cholesterol  Cal  17   Continue statin.  Marland Kitchen Dyspnea    with activity  . Dyspnea on exertion 06/30/2018  . Essential hypertension 11/01/2015   Last Assessment & Plan:  Resume home medications.  Follow-up as noted above.  . Gastroenteritis due to COVID-19 virus 03/17/2020  . Generalized weakness 03/13/2020  . GERD (gastroesophageal reflux disease)   . Herpes zoster 05/19/2019  . History of aortic stenosis 03/11/2020  . Myocardial infarction ([place])    approx 1994  . Neuropathy   . Other chest pain   . Pneumonia due to COVID-19 virus 03/13/2020  . Recurrent falls 06/25/2019  . 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 Severe aortic stenosis   . Sleep apnea   . 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  . Thrombocytopenia ([place]) 03/13/2020    Past Surgical History:  Procedure Laterality Date  . CARDIAC CATHETERIZATION    . CARDIAC SURGERY    . CATARACT EXTRACTION, BILATERAL    . CORONARY ARTERY BYPASS GRAFT    . CORONARY STENT INTERVENTION N/A 10/07/2019   Procedure: CORONARY STENT INTERVENTION;  Surgeon: Sherren Mocha, MD;  Location: [facility];  Service: Cardiovascular;  Laterality: N/A;  .  EYE SURGERY    . HEMORROIDECTOMY    . RIGHT/LEFT HEART CATH AND CORONARY/GRAFT ANGIOGRAPHY N/A 10/07/2019   Procedure: RIGHT/LEFT HEART CATH AND CORONARY/GRAFT ANGIOGRAPHY;  Surgeon: Sherren Mocha, MD;  Location: [facility];  Service: Cardiovascular;  Laterality: N/A;  . TEE WITHOUT CARDIOVERSION N/A 10/27/2019   Procedure: TRANSESOPHAGEAL ECHOCARDIOGRAM (TEE);  Surgeon: Burnell Blanks, MD;  Location: [facility];  Service: Open Heart Surgery;  Laterality: N/A;  . TRANSCATHETER AORTIC VALVE REPLACEMENT, TRANSFEMORAL N/A 10/27/2019    Procedure: TRANSCATHETER AORTIC VALVE REPLACEMENT, TRANSFEMORAL;  Surgeon: Burnell Blanks, MD;  Location: [facility];  Service: Open Heart Surgery;  Laterality: N/A;    Family History  Problem Relation Age of Onset  . Cancer Mother   . Diabetes Mother   . Kidney disease Mother   . Stroke Mother   . Sleep disorder Mother   . Arthritis Father   . Migraines Father   . Hypertension Father   . Sleep disorder Father   . Arthritis Brother   . Cancer Brother     Social History   Socioeconomic History  . Marital status: Widowed    Spouse name: [place]ly [place]  . Number of children: 2  . Years of education: Not on file  . Highest education level: Not on file  Occupational History  . 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 Smoking status: Former Smoker    Types: Cigarettes, Cigars    Quit date: 05/02/1962    Years since quitting: 58.3  . Smokeless tobacco: Never Used  Vaping Use  . Vaping Use: Never used  Substance and Sexual Activity  . Alcohol use: No  . Drug use: No  . Sexual activity: Not Currently  Other Topics Concern  . Not on file  Social History Narrative   Wife passed away apx Jun 04, 2015, son passed away one year later.  He has one son that lives with him and one daughter that lives beside of him. He is close with his family.  His sister cooks meals for him regularly.  He has friends at the cafe he visits regularly.   Social Determinants of Health   Financial Resource Strain: Low Risk   . Difficulty of Paying Living Expenses: Not hard at all  Food Insecurity: No Food Insecurity  . Worried About Charity fundraiser in the Last Year: Never true  . Ran Out of Food in the Last Year: Never true  Transportation Needs: No Transportation Needs  . Lack of Transportation (Medical): No  . Lack of Transportation (Non-Medical): No  Physical Activity: Insufficiently Active  . Days of Exercise per Week: 2 days  . Minutes of  Exercise per Session: 10 min  Stress: No Stress Concern Present  . Feeling of Stress : Not at all  Social Connections: Moderately Isolated  . Frequency of Communication with Friends and Family: More than three times a week  . Frequency of Social Gatherings with Friends and Family: More than three times a week  . Attends Religious Services: 1 to 4 times per year  . Active Member of Clubs or Organizations: No  . Attends Archivist Meetings: Never  . Marital Status: Widowed  Intimate Partner Violence: Not At Risk  . Fear of Current or Ex-Partner: No  . Emotionally Abused: No  . Physically Abused: No  . Sexually Abused: No    Outpatient Medications Prior to Visit  Medication Sig  Dispense Refill  . acetaminophen (TYLENOL) 650 MG CR tablet Take 650 mg by mouth every 8 (eight) hours as needed for pain.     Marland Kitchen ascorbic acid (VITAMIN C) 500 MG tablet Take 1 tablet (500 mg total) by mouth daily. 30 tablet 0  . aspirin EC 81 MG tablet Take 81 mg by mouth daily.    . cholecalciferol (VITAMIN D) 25 MCG (1000 UNIT) tablet Take 1,000 Units by mouth daily.    Marland Kitchen lisinopril (ZESTRIL) 10 MG tablet Take 0.5 tablets (5 mg total) by mouth daily. 90 tablet 1  . meclizine (ANTIVERT) 25 MG tablet Take 1 tablet (25 mg total) by mouth 3 (three) times daily as needed for dizziness. 30 tablet 0  . metoprolol succinate (TOPROL XL) 25 MG 24 hr tablet Take 1 tablet (25 mg total) by mouth daily. 30 tablet 0  . nitroGLYCERIN (NITROSTAT) 0.4 MG SL tablet Place 1 tablet (0.4 mg total) under the tongue every 5 (five) minutes as needed for chest pain. 25 tablet 2  . pantoprazole (PROTONIX) 40 MG tablet Take 1 tablet (40 mg total) by mouth daily. 90 tablet 2  . rosuvastatin (CRESTOR) 20 MG tablet TAKE 1 TABLET BY MOUTH AT  BEDTIME (Patient taking differently: Take 20 mg by mouth at bedtime.) 90 tablet 2  . sertraline (ZOLOFT) 50 MG tablet Take 1 tablet (50 mg total) by mouth daily. 90 tablet 2  . vitamin B-12  (CYANOCOBALAMIN) 500 MCG tablet Take 500 mcg by mouth daily. (Patient not taking: No sig reported)    . zinc sulfate 220 (50 Zn) MG capsule Take 1 capsule (220 mg total) by mouth daily. 30 capsule 0  . clopidogrel (PLAVIX) 75 MG tablet Take 1 tablet (75 mg total) by mouth at bedtime. 30 tablet 6  . gabapentin (NEURONTIN) 300 MG capsule Take 1 capsule (300 mg total) by mouth 2 (two) times daily. 180 capsule 2   No facility-administered medications prior to visit.    Allergies  Allergen Reactions  . Poison Oak Extract Rash    Review of Systems  Constitutional: Negative for chills and fever.  HENT: Positive for rhinorrhea and sore throat. Negative for congestion.   Eyes: Negative for visual disturbance.  Respiratory: Positive for cough and shortness of breath (baseline).   Cardiovascular: Negative for chest pain.  Gastrointestinal: Negative for abdominal pain, diarrhea and nausea.  Endocrine: Negative for polyuria.  Genitourinary: Negative for difficulty urinating and dysuria.  Neurological: Negative.        Objective:    Physical Exam Vitals reviewed.  Constitutional:      Appearance: Normal appearance.  HENT:     Head: Normocephalic.     Right Ear: Tympanic membrane, ear canal and external ear normal.     Left Ear: Tympanic membrane and ear canal normal.  Eyes:     Extraocular Movements: Extraocular movements intact.     Conjunctiva/sclera: Conjunctivae normal.     Pupils: Pupils are equal, round, and reactive to light.  Cardiovascular:     Rate and Rhythm: Normal rate and regular rhythm.     Pulses: Normal pulses.     Heart sounds: Normal heart sounds. No murmur heard. No gallop.   Pulmonary:     Effort: Pulmonary effort is normal. No respiratory distress.     Breath sounds: Rhonchi present. No rales.  Musculoskeletal:        General: Normal range of motion.  Skin:    Capillary Refill: Capillary refill takes less than 2  seconds.  Neurological:     General: No  focal deficit present.     Mental Status: He is alert.     BP 136/74   Pulse 82   Temp (!) 97.3 F (36.3 C)   Ht _0  (1.676 m)   Wt 164 lb (74.4 kg)   SpO2 98%   BMI 26.47 kg/m  Wt Readings from Last 3 Encounters:  08/16/20 164 lb (74.4 kg)  07/13/20 165 lb (74.8 kg)  07/07/20 165 lb (74.8 kg)    There are no preventive care reminders to display for this patient.  There are no preventive care reminders to display for this patient.   No results found for: TSH Lab Results  Component Value Date   WBC 6.5 05/09/2020   HGB 11.7 (L) 05/09/2020   HCT 36.8 (L) 05/09/2020   MCV 93 05/09/2020   PLT 98 (LL) 05/09/2020   Lab Results  Component Value Date   NA 143 07/07/2020   K 4.1 07/07/2020   CO2 20 07/07/2020   GLUCOSE 175 (H) 07/07/2020   BUN 16 07/07/2020   CREATININE 1.03 07/07/2020   BILITOT 0.6 05/09/2020   ALKPHOS 74 05/09/2020   AST 23 05/09/2020   ALT 12 05/09/2020   PROT 6.2 05/09/2020   ALBUMIN 4.1 05/09/2020   CALCIUM 9.3 07/07/2020   ANIONGAP 8 03/17/2020   EGFR 70 07/07/2020   Lab Results  Component Value Date   CHOL 139 07/07/2020   Lab Results  Component Value Date   HDL 38 (L) 07/07/2020   Lab Results  Component Value Date   LDLCALC 52 07/07/2020   Lab Results  Component Value Date   TRIG 322 (H) 07/07/2020   Lab Results  Component Value Date   CHOLHDL 3.7 07/07/2020   Lab Results  Component Value Date   HGBA1C 4.8 10/23/2019       Assessment & Plan:  Diagnoses and all orders for this visit: Neuropathy -     gabapentin (NEURONTIN) 300 MG capsule; Take 1 capsule (300 mg total) by mouth 3 (three) times daily. 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 Patient is stable after stroke  Bronchial  pneumonia -     azithromycin (ZITHROMAX) 250 MG tablet; 2 tablets on day 1, then 1 tablet daily on days 2-6. -     guaiFENesin-codeine (CHERATUSSIN AC) 100-10 MG/5ML syrup; Take 5 mLs by mouth 3 (three) times daily as needed. Patient is coughing and has rhonchi.        I spent 20 minutes dedicated to the care of this patient on the date of this encounter to include face-to-face time with the patient, as well as:   Follow-up: Return as scheduled.  An After Visit Summary was printed and given to the patient.  [name], MD [facility] [phone]

## 2020-08-25 ENCOUNTER — Other Ambulatory Visit: Payer: Self-pay | Admitting: Legal Medicine

## 2020-08-25 ENCOUNTER — Telehealth: Payer: Self-pay

## 2020-08-25 NOTE — Progress Notes (Signed)
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 Patient is stable after stroke.  Bronchial pneumonia  Recent consult visits:  none  Hospital visits:  03/12/20-Admission for pneumonia due to Covid  Medications: Outpatient Encounter Medications as of 08/25/2020  Medication Sig  . acetaminophen (TYLENOL) 650 MG CR tablet Take 650 mg by mouth every 8 (eight) hours as needed for pain.   Marland Kitchen ascorbic acid (VITAMIN C) 500 MG tablet Take 1 tablet (500 mg total) by mouth daily.  Marland Kitchen aspirin EC 81 MG tablet Take 81 mg by mouth daily.  Marland Kitchen azithromycin (ZITHROMAX) 250 MG tablet 2 tablets on day 1, then 1 tablet daily on days 2-6.  . cholecalciferol (VITAMIN D) 25 MCG (1000 UNIT) tablet Take 1,000 Units by mouth daily.  . clopidogrel (PLAVIX) 75 MG tablet Take 1 tablet (75 mg total) by mouth at bedtime.  . gabapentin (NEURONTIN) 300 MG capsule Take 1 capsule (300 mg total) by mouth 3 (three) times daily.  Marland Kitchen guaiFENesin-codeine (CHERATUSSIN AC) 100-10 MG/5ML syrup Take 5 mLs by mouth 3 (three) times daily as needed.  Marland Kitchen lisinopril (ZESTRIL) 10 MG tablet Take 0.5 tablets (5 mg total) by mouth daily.  . meclizine (ANTIVERT) 25 MG tablet Take 1 tablet (25 mg total) by mouth 3 (three) times daily as needed for dizziness.  . metoprolol succinate (TOPROL XL) 25 MG 24 hr tablet Take 1 tablet (25 mg total) by mouth daily.  . nitroGLYCERIN (NITROSTAT) 0.4 MG SL tablet Place 1 tablet (0.4 mg total) under the tongue every 5 (five) minutes as needed for chest pain.  . pantoprazole (PROTONIX) 40 MG tablet Take 1 tablet  (40 mg total) by mouth daily.  . rosuvastatin (CRESTOR) 20 MG tablet TAKE 1 TABLET BY MOUTH AT  BEDTIME (Patient taking differently: Take 20 mg by mouth at bedtime.)  . sertraline (ZOLOFT) 50 MG tablet Take 1 tablet (50 mg total) by mouth daily.  . vitamin B-12 (CYANOCOBALAMIN) 500 MCG tablet Take 500 mcg by mouth daily. (Patient not taking: No sig reported)  . zinc sulfate 220 (50 Zn) MG capsule Take 1 capsule (220 mg total) by mouth daily.  . [DISCONTINUED] albuterol (VENTOLIN HFA) 108 (90 Base) MCG/ACT inhaler Inhale 2 puffs into the lungs every 2 (two) hours as needed for wheezing or shortness of breath.   No facility-administered encounter medications on file as of 08/25/2020.     Recent Office Vitals: BP Readings from Last 3 Encounters:  08/16/20 136/74  07/13/20 124/72  07/07/20 (!) 120/58   Pulse Readings from Last 3 Encounters:  08/16/20 82  07/13/20 72  07/07/20 (!) 55    Wt Readings from Last 3 Encounters:  08/16/20 164 lb (74.4 kg)  07/13/20 165 lb (74.8 kg)  07/07/20 165 lb (74.8 kg)     Kidney Function Lab Results  Component Value Date/Time   CREATININE 1.03 07/07/2020 03:17 PM   CREATININE 0.80 06/01/2020 10:12 AM   GFRNONAA 78 05/09/2020 08:26 AM   GFRNONAA >60 03/17/2020 12:44 AM   GFRAA 90 05/09/2020 08:26 AM    BMP Latest Ref Rng & Units 07/07/2020 06/01/2020 05/09/2020  Glucose 65 - 99 mg/dL  175(H) 85 87  BUN 8 - 27 mg/dL 16 14 10   Creatinine 0.76 - 1.27 mg/dL 7.37 1.06  BUN/Creat Ratio 10 - 24 16 18 12   Sodium 134 - 144 mmol/L 143 143 146(H)  Potassium 3.5 - 5.2 mmol/L 4.1 4.4 4.3  Chloride 96 - 106 mmol/L 107(H) 107(H) 111(H)  CO2 20 - 29 mmol/L 20 21 18(L)  Calcium 8.6 - 10.2 mg/dL 9.3 9.3 8.9     . Current antihypertensive regimen:   Lisinopril 5 mg daily   Metoprolol succinate 25 mg daily   Patient verbally confirms he is taking the above medications as directed. Yes  . How often are you checking your Blood Pressure? twice  daily   . Current home BP readings: Patient stated today he reading was 104/60 pulse was 78.    2.69 DATE:             BP               PULSE   08/25/20  104/60  78  . Wrist or arm cuff: Patient uses a arm cuff . Caffeine intake: Patient drinks coffee . Salt intake: Patient stated he pretty much eats what he wants . OTC medications including pseudoephedrine or NSAIDs? Patient takes a low dose aspirin daily  . Any readings above 180/120? No   . What recent interventions/DTPs have been made by any provider to improve Blood Pressure control since last CPP Visit: Patient is taking his medication as directed  . Any recent hospitalizations or ED visits since last visit with CPP? No  . What diet changes have been made to improve Blood Pressure Control?  Patient stated he pretty much eats what he wants  . What exercise is being done to improve your Blood Pressure Control?  Patient stated he is not steady, he does walk around the house  Adherence Review: Is the patient currently on ACE/ARB medication? Yes Does the patient have >5 day gap between last estimated fill dates? CPP to review   Star Rating Drugs:  I called [facility] to confirm dates Medication:  Last Fill: Day Supply Plavix   08/20/20  90 Lisinopril  05/26/20   90-they are going to refill that  Metoprolol  07/11/20  90 Zetia                           RX had expired they will contact office Rosuvastatin  05/17/20  90-can be filled if patient is still taking   07/13/20, [facility] Clinical Pharmacist Assistant [phone]

## 2020-09-02 ENCOUNTER — Other Ambulatory Visit: Payer: Self-pay | Admitting: Physician Assistant

## 2020-09-02 DIAGNOSIS — Z952 Presence of prosthetic heart valve: Secondary | ICD-10-CM

## 2020-09-28 ENCOUNTER — Telehealth: Payer: Self-pay

## 2020-09-28 NOTE — Progress Notes (Signed)
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 Patient is stable after stroke per Dr Marina Goodell.  Follow up as scheduled.    Recent consult visits:  09/02/20-[facility] PAC-Cardiology, ECHOCARDIOGRAM COMPLETE Order   Hospital visits:  None in previous 6 months  Medications: Outpatient Encounter Medications as of 09/28/2020  Medication Sig   acetaminophen (TYLENOL) 650 MG CR tablet Take 650 mg by mouth every 8 (eight) hours as needed for pain.    ascorbic acid (VITAMIN C) 500 MG tablet Take 1 tablet (500 mg total) by mouth daily.   aspirin EC 81 MG tablet Take 81 mg by mouth daily.   azithromycin (ZITHROMAX) 250 MG tablet 2 tablets on day 1, then 1 tablet daily on days 2-6.   cholecalciferol (VITAMIN D) 25 MCG (1000 UNIT) tablet Take 1,000 Units by mouth daily.   clopidogrel (PLAVIX) 75 MG tablet Take 1 tablet (75 mg total) by mouth at bedtime.   ezetimibe (ZETIA) 10 MG tablet TAKE 1 TABLET BY MOUTH  DAILY   gabapentin (NEURONTIN) 300 MG capsule Take 1 capsule (300 mg total) by mouth 3 (three) times daily.   guaiFENesin-codeine (CHERATUSSIN AC) 100-10 MG/5ML syrup Take 5 mLs by mouth 3 (three) times daily as needed.   lisinopril (ZESTRIL) 10 MG tablet Take 0.5 tablets (5 mg total) by mouth daily.   meclizine (ANTIVERT) 25 MG tablet Take 1 tablet (25 mg total) by mouth 3 (three) times daily as needed for dizziness.   metoprolol succinate (TOPROL XL) 25 MG 24 hr tablet Take 1 tablet (25 mg total) by mouth daily.   nitroGLYCERIN (NITROSTAT) 0.4 MG SL tablet  Place 1 tablet (0.4 mg total) under the tongue every 5 (five) minutes as needed for chest pain.   pantoprazole (PROTONIX) 40 MG tablet Take 1 tablet (40 mg total) by mouth daily.   rosuvastatin (CRESTOR) 20 MG tablet TAKE 1 TABLET BY MOUTH AT  BEDTIME (Patient taking differently: Take 20 mg by mouth at bedtime.)   sertraline (ZOLOFT) 50 MG tablet Take 1 tablet (50 mg total) by mouth daily.   vitamin B-12 (CYANOCOBALAMIN) 500 MCG tablet Take 500 mcg by mouth daily. (Patient not taking: No sig reported)   zinc sulfate 220 (50 Zn) MG capsule Take 1 capsule (220 mg total) by mouth daily.   [DISCONTINUED] albuterol (VENTOLIN HFA) 108 (90 Base) MCG/ACT inhaler Inhale 2 puffs into the lungs every 2 (two) hours as needed for wheezing or shortness of breath.   No facility-administered encounter medications on file as of 09/28/2020.   Lipid Panel    Component Value Date/Time   CHOL 139 07/07/2020 1517   TRIG 322 (H) 07/07/2020 1517   HDL 38 (L) 07/07/2020 1517   LDLCALC 52 07/07/2020 1517    10-year ASCVD risk score: The ASCVD Risk score Denman George DC Jr., et al., 2013) failed to calculate for the following reasons:   The 2013 ASCVD risk score is only valid for ages 40 to 17   The patient has a prior MI or stroke diagnosis  Current antihyperlipidemic regimen:  rosuvastatin 20 mg daily Aspirin 81 mg daily  Clopidogrel 75 daily at bedtime  Previous antihyperlipidemic medications tried: None  ASCVD risk enhancing conditions: age >3 and HTN  What recent interventions/DTPs have been made by any provider to improve Cholesterol control since last CPP Visit: Patient takes his medication as directed, he denies any medication issues.  He checks his blood pressure twice a day and said they are in range and doing good.    Any recent hospitalizations or ED visits since last visit with CPP? No  What diet changes have been made to improve Cholesterol?  Patient does not watch his diet, he stated he eats what he  wants.   What exercise is being done to improve Cholesterol?  Patient stays active, he is stated at time he is unsteady on his feet  Adherence Review: Does the patient have >5 day gap between last estimated fill dates? No  Star Rating Drugs Medication Name Last Fill Days supply Clopidogrel  08/16/20 90  Rosuvastatin  05/17/20 90 Patient has extra on hand from previous fills Metoprolol   07/07/20 90  Care Gaps: Last annual wellness visit?07/13/20  Leilani Able, CMA Clinical Pharmacist Assistant [phone]

## 2020-10-11 ENCOUNTER — Other Ambulatory Visit: Payer: Self-pay

## 2020-10-11 ENCOUNTER — Encounter: Payer: Self-pay | Admitting: Legal Medicine

## 2020-10-11 ENCOUNTER — Ambulatory Visit (INDEPENDENT_AMBULATORY_CARE_PROVIDER_SITE_OTHER): Payer: Medicare Other | Admitting: Legal Medicine

## 2020-10-11 VITALS — BP 160/80 | HR 55 | Temp 97.7°F | Resp 16 | Ht 66.0 in | Wt 166.0 lb

## 2020-10-11 DIAGNOSIS — M7552 Bursitis of left shoulder: Secondary | ICD-10-CM | POA: Diagnosis not present

## 2020-10-11 NOTE — Patient Instructions (Signed)
Shoulder Exercises Ask your health care provider which exercises are safe for you. Do exercises exactly as told by your health care provider and adjust them as directed. It is normal to feel mild stretching, pulling, tightness, or discomfort as you do these exercises. Stop right away if you feel sudden pain or your pain gets worse. Do not begin these exercises until told by your health care provider. Stretching exercises External rotation and abduction This exercise is sometimes called corner stretch. This exercise rotates your arm outward (external rotation) and moves your arm out from your body (abduction). Stand in a doorway with one of your feet slightly in front of the other. This is called a staggered stance. If you cannot reach your forearms to the door frame, stand facing a corner of a room. Choose one of the following positions as told by your health care provider: Place your hands and forearms on the door frame above your head. Place your hands and forearms on the door frame at the height of your head. Place your hands on the door frame at the height of your elbows. Slowly move your weight onto your front foot until you feel a stretch across your chest and in the front of your shoulders. Keep your head and chest upright and keep your abdominal muscles tight. Hold for __________ seconds. To release the stretch, shift your weight to your back foot. Repeat __________ times. Complete this exercise __________ times a day. Extension, standing Stand and hold a broomstick, a cane, or a similar object behind your back. Your hands should be a little wider than shoulder width apart. Your palms should face away from your back. Keeping your elbows straight and your shoulder muscles relaxed, move the stick away from your body until you feel a stretch in your shoulders (extension). Avoid shrugging your shoulders while you move the stick. Keep your shoulder blades tucked down toward the middle of your  back. Hold for __________ seconds. Slowly return to the starting position. Repeat __________ times. Complete this exercise __________ times a day. Range-of-motion exercises Pendulum  Stand near a wall or a surface that you can hold onto for balance. Bend at the waist and let your left / right arm hang straight down. Use your other arm to support you. Keep your back straight and do not lock your knees. Relax your left / right arm and shoulder muscles, and move your hips and your trunk so your left / right arm swings freely. Your arm should swing because of the motion of your body, not because you are using your arm or shoulder muscles. Keep moving your hips and trunk so your arm swings in the following directions, as told by your health care provider: Side to side. Forward and backward. In clockwise and counterclockwise circles. Continue each motion for __________ seconds, or for as long as told by your health care provider. Slowly return to the starting position. Repeat __________ times. Complete this exercise __________ times a day. Shoulder flexion, standing  Stand and hold a broomstick, a cane, or a similar object. Place your hands a little more than shoulder width apart on the object. Your left / right hand should be palm up, and your other hand should be palm down. Keep your elbow straight and your shoulder muscles relaxed. Push the stick up with your healthy arm to raise your left / right arm in front of your body, and then over your head until you feel a stretch in your shoulder (flexion). Avoid   shrugging your shoulder while you raise your arm. Keep your shoulder blade tucked down toward the middle of your back. Hold for __________ seconds. Slowly return to the starting position. Repeat __________ times. Complete this exercise __________ times a day. Shoulder abduction, standing Stand and hold a broomstick, a cane, or a similar object. Place your hands a little more than shoulder  width apart on the object. Your left / right hand should be palm up, and your other hand should be palm down. Keep your elbow straight and your shoulder muscles relaxed. Push the object across your body toward your left / right side. Raise your left / right arm to the side of your body (abduction) until you feel a stretch in your shoulder. Do not raise your arm above shoulder height unless your health care provider tells you to do that. If directed, raise your arm over your head. Avoid shrugging your shoulder while you raise your arm. Keep your shoulder blade tucked down toward the middle of your back. Hold for __________ seconds. Slowly return to the starting position. Repeat __________ times. Complete this exercise __________ times a day. Internal rotation  Place your left / right hand behind your back, palm up. Use your other hand to dangle an exercise band, a towel, or a similar object over your shoulder. Grasp the band with your left / right hand so you are holding on to both ends. Gently pull up on the band until you feel a stretch in the front of your left / right shoulder. The movement of your arm toward the center of your body is called internal rotation. Avoid shrugging your shoulder while you raise your arm. Keep your shoulder blade tucked down toward the middle of your back. Hold for __________ seconds. Release the stretch by letting go of the band and lowering your hands. Repeat __________ times. Complete this exercise __________ times a day. Strengthening exercises External rotation  Sit in a stable chair without armrests. Secure an exercise band to a stable object at elbow height on your left / right side. Place a soft object, such as a folded towel or a small pillow, between your left / right upper arm and your body to move your elbow about 4 inches (10 cm) away from your side. Hold the end of the exercise band so it is tight and there is no slack. Keeping your elbow pressed  against the soft object, slowly move your forearm out, away from your abdomen (external rotation). Keep your body steady so only your forearm moves. Hold for __________ seconds. Slowly return to the starting position. Repeat __________ times. Complete this exercise __________ times a day. Shoulder abduction  Sit in a stable chair without armrests, or stand up. Hold a __________ weight in your left / right hand, or hold an exercise band with both hands. Start with your arms straight down and your left / right palm facing in, toward your body. Slowly lift your left / right hand out to your side (abduction). Do not lift your hand above shoulder height unless your health care provider tells you that this is safe. Keep your arms straight. Avoid shrugging your shoulder while you do this movement. Keep your shoulder blade tucked down toward the middle of your back. Hold for __________ seconds. Slowly lower your arm, and return to the starting position. Repeat __________ times. Complete this exercise __________ times a day. Shoulder extension Sit in a stable chair without armrests, or stand up. Secure an exercise band   to a stable object in front of you so it is at shoulder height. Hold one end of the exercise band in each hand. Your palms should face each other. Straighten your elbows and lift your hands up to shoulder height. Step back, away from the secured end of the exercise band, until the band is tight and there is no slack. Squeeze your shoulder blades together as you pull your hands down to the sides of your thighs (extension). Stop when your hands are straight down by your sides. Do not let your hands go behind your body. Hold for __________ seconds. Slowly return to the starting position. Repeat __________ times. Complete this exercise __________ times a day. Shoulder row Sit in a stable chair without armrests, or stand up. Secure an exercise band to a stable object in front of you so it  is at waist height. Hold one end of the exercise band in each hand. Position your palms so that your thumbs are facing the ceiling (neutral position). Bend each of your elbows to a 90-degree angle (right angle) and keep your upper arms at your sides. Step back until the band is tight and there is no slack. Slowly pull your elbows back behind you. Hold for __________ seconds. Slowly return to the starting position. Repeat __________ times. Complete this exercise __________ times a day. Shoulder press-ups  Sit in a stable chair that has armrests. Sit upright, with your feet flat on the floor. Put your hands on the armrests so your elbows are bent and your fingers are pointing forward. Your hands should be about even with the sides of your body. Push down on the armrests and use your arms to lift yourself off the chair. Straighten your elbows and lift yourself up as much as you comfortably can. Move your shoulder blades down, and avoid letting your shoulders move up toward your ears. Keep your feet on the ground. As you get stronger, your feet should support less of your body weight as you lift yourself up. Hold for __________ seconds. Slowly lower yourself back into the chair. Repeat __________ times. Complete this exercise __________ times a day. Wall push-ups  Stand so you are facing a stable wall. Your feet should be about one arm-length away from the wall. Lean forward and place your palms on the wall at shoulder height. Keep your feet flat on the floor as you bend your elbows and lean forward toward the wall. Hold for __________ seconds. Straighten your elbows to push yourself back to the starting position. Repeat __________ times. Complete this exercise __________ times a day. This information is not intended to replace advice given to you by your health care provider. Make sure you discuss any questions you have with your healthcare provider. Document Revised: 07/04/2018 Document  Reviewed: 04/11/2018 Elsevier Patient Education  2022 [employer] Inc.  

## 2020-10-11 NOTE — Progress Notes (Signed)
Established Patient Office Visit  Subjective:  Patient ID: Jack Hodges, male    DOB: 07-26-1930  Age: 87 y.o. MRN: [id number]  CC:  Chief Complaint  Patient presents with   Shoulder Pain    Left shoulder pain     HPI Jack Hodges presents for recurrent left shoulder pai.  He has been treated for bursitis in pas with injections.can raise to 90 degrees, he has some popping.  Past Medical History:  Diagnosis Date   Acute bursitis of left shoulder 01/04/2020   Acute respiratory failure with hypoxia (HCC) 03/17/2020   Angina pectoris ([place]) 10/07/2019   Arthritis    Atherosclerosis of aorta ([place]) 09/24/2019   Bilateral foot-drop 01/18/2017   BMI 27.0-27.9,adult 09/24/2019   Cellulitis of left hand 07/07/2019   Coronary artery disease involving native coronary artery of native heart without angina pectoris 11/01/2015   Overview:  Bypass surgery in 1996 Overview:  Overview:  Bypass surgery in [place]:  Continue his current regimen.  Follow up as noted.   CVA (cerebral vascular accident) ([place]) 04/03/2016   Last Assessment & Plan:  The patient underwent extensive CVA workup.  MRI is negative.  He does have aortic stenosis by echocardiogram.  Upon review of Care everywhere he is followed by Dr. Agustin Cree, cardiologist for this.  Carotid ultrasound negative for hemodynamically significant stenosis.  Lipid panel within normal limits.  He has not had any further episodes of dizziness or nausea vomiting.    Dementia ([place])    Depression    Dizziness 04/19/2016   DNR (do not resuscitate) 04/03/2016   Overview:  I had an extensive discussion with the patient on 04/03/2016 regarding his end of life wishes.  He and his daughter agree that do not resuscitate is in keeping with his beliefs.   Dyslipidemia 11/01/2015   Last Assessment & Plan:  Formatting of this note might be different from the original. Cholesterol 136   Triglycerides  83  HDL  50.1  LDL Calculated  69  VLDL Cholesterol Cal   17   Continue statin.   Dyspnea    with activity   Dyspnea on exertion 06/30/2018   Essential hypertension 11/01/2015   Last Assessment & Plan:  Resume home medications.  Follow-up as noted above.   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   Thrombocytopenia ([place]) 03/13/2020    Past Surgical History:  Procedure Laterality Date   CARDIAC CATHETERIZATION     CARDIAC SURGERY     CATARACT EXTRACTION, BILATERAL     CORONARY ARTERY BYPASS GRAFT     CORONARY STENT INTERVENTION N/A 10/07/2019   Procedure: CORONARY STENT INTERVENTION;  Surgeon: Sherren Mocha, MD;  Location: [facility];  Service: Cardiovascular;  Laterality: N/A;  EYE SURGERY     HEMORROIDECTOMY     RIGHT/LEFT HEART CATH AND CORONARY/GRAFT ANGIOGRAPHY N/A 10/07/2019   Procedure: RIGHT/LEFT HEART CATH AND CORONARY/GRAFT ANGIOGRAPHY;  Surgeon: Sherren Mocha, MD;  Location: [facility];  Service: Cardiovascular;  Laterality: N/A;   TEE WITHOUT CARDIOVERSION N/A 10/27/2019   Procedure: TRANSESOPHAGEAL ECHOCARDIOGRAM (TEE);  Surgeon: Burnell Blanks, MD;  Location: [facility];  Service: Open Heart Surgery;  Laterality: N/A;   TRANSCATHETER AORTIC VALVE REPLACEMENT, TRANSFEMORAL N/A 10/27/2019   Procedure: TRANSCATHETER AORTIC  VALVE REPLACEMENT, TRANSFEMORAL;  Surgeon: Burnell Blanks, MD;  Location: [facility];  Service: Open Heart Surgery;  Laterality: N/A;    Family History  Problem Relation Age of Onset   Cancer Mother    Diabetes Mother    Kidney disease Mother    Stroke Mother    Sleep disorder Mother    Arthritis Father    Migraines Father    Hypertension Father    Sleep disorder Father    Arthritis Brother    Cancer Brother     Social History   Socioeconomic History   Marital status: Widowed    Spouse name: Curly [place]   Number of children: 2   Years of education: Not on file   Highest education level: Not on file  Occupational History   Occupation: Retired    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  He has one son that lives with him and one daughter that lives beside of him. He is close with his family.  His sister cooks meals for him regularly.  He has friends at the cafe he visits regularly.   Social Determinants of Health   Financial Resource Strain: Low Risk    Difficulty of Paying Living Expenses: Not hard at all  Food Insecurity: No Food Insecurity   Worried About Charity fundraiser in the Last Year: Never true   [place] in the Last Year: Never true  Transportation Needs: No Transportation Needs   Lack of Transportation (Medical): No   Lack of Transportation (Non-Medical): No  Physical Activity: Insufficiently Active   Days of Exercise per Week: 2 days   Minutes of Exercise per Session: 10 min  Stress: No Stress Concern Present   Feeling of  Stress : Not at all  Social Connections: Moderately Isolated   Frequency of Communication with Friends and Family: More than three times a week   Frequency of Social Gatherings with Friends and Family: More than three times a week   Attends Religious Services: 1 to 4 times per year   Active Member of [employer] or Organizations: No   Attends Archivist Meetings: Never   Marital Status: Widowed  Human resources officer Violence: Not At Risk   Fear of Current or Ex-Partner: No   Emotionally Abused: No   Physically Abused: No   Sexually Abused: No    Outpatient Medications Prior to Visit  Medication Sig Dispense Refill  acetaminophen (TYLENOL) 650 MG CR tablet Take 650 mg by mouth every 8 (eight) hours as needed for pain.      ascorbic acid (VITAMIN C) 500 MG tablet Take 1 tablet (500 mg total) by mouth daily. 30 tablet 0   aspirin EC 81 MG tablet Take 81 mg by mouth daily.     azithromycin (ZITHROMAX) 250 MG tablet 2 tablets on day 1, then 1 tablet daily on days 2-6. 6 each 0   cholecalciferol (VITAMIN D) 25 MCG (1000 UNIT) tablet Take 1,000 Units by mouth daily.     clopidogrel (PLAVIX) 75 MG tablet Take 1 tablet (75 mg total) by mouth at bedtime. 90 tablet 2   ezetimibe (ZETIA) 10 MG tablet TAKE 1 TABLET BY MOUTH  DAILY 90 tablet 3   gabapentin (NEURONTIN) 300 MG capsule Take 1 capsule (300 mg total) by mouth 3 (three) times daily. 270 capsule 2   guaiFENesin-codeine (CHERATUSSIN AC) 100-10 MG/5ML syrup Take 5 mLs by mouth 3 (three) times daily as needed. 120 mL 0   lisinopril (ZESTRIL) 10 MG tablet Take 0.5 tablets (5 mg total) by mouth daily. 90 tablet 1   meclizine (ANTIVERT) 25 MG tablet Take 1 tablet (25 mg total) by mouth 3 (three) times daily as needed for dizziness. 30 tablet 0   metoprolol succinate (TOPROL XL) 25 MG 24 hr tablet Take 1 tablet (25 mg total) by mouth daily. 30 tablet 0   pantoprazole (PROTONIX) 40 MG tablet Take 1 tablet (40 mg total) by mouth daily. 90 tablet 2    rosuvastatin (CRESTOR) 20 MG tablet TAKE 1 TABLET BY MOUTH AT  BEDTIME (Patient taking differently: Take 20 mg by mouth at bedtime.) 90 tablet 2   sertraline (ZOLOFT) 50 MG tablet Take 1 tablet (50 mg total) by mouth daily. 90 tablet 2   vitamin B-12 (CYANOCOBALAMIN) 500 MCG tablet Take 500 mcg by mouth daily.     zinc sulfate 220 (50 Zn) MG capsule Take 1 capsule (220 mg total) by mouth daily. 30 capsule 0   nitroGLYCERIN (NITROSTAT) 0.4 MG SL tablet Place 1 tablet (0.4 mg total) under the tongue every 5 (five) minutes as needed for chest pain. 25 tablet 2   No facility-administered medications prior to visit.    Allergies  Allergen Reactions   Poison Oak Extract Rash    ROS Review of Systems  Constitutional:  Negative for chills, fatigue and fever.  HENT:  Negative for congestion, ear pain and sore throat.   Respiratory:  Negative for cough and shortness of breath.   Cardiovascular:  Negative for chest pain.  Gastrointestinal:  Negative for abdominal pain, constipation, diarrhea, nausea and vomiting.  Endocrine: Negative for polydipsia, polyphagia and polyuria.  Genitourinary:  Negative for dysuria and frequency.  Musculoskeletal:  Negative for arthralgias (left shoulder) and myalgias.  Neurological:  Negative for dizziness and headaches.  Psychiatric/Behavioral:  Negative for dysphoric mood.        No dysphoria     Objective:    Physical Exam Vitals reviewed.  Constitutional:      Appearance: Normal appearance.  HENT:     Right Ear: Tympanic membrane, ear canal and external ear normal.     Left Ear: Tympanic membrane, ear canal and external ear normal.  Cardiovascular:     Rate and Rhythm: Normal rate. Rhythm irregular.     Pulses: Normal pulses.     Heart sounds: No murmur heard.   No gallop.  Pulmonary:  Effort: Pulmonary effort is normal. No respiratory distress.     Breath sounds: No wheezing.  Musculoskeletal:     Left shoulder: Tenderness present.  Decreased range of motion.       Arms:     Cervical back: Normal range of motion and neck supple.     Comments: Abduction to 70 degrees, rotation 90 degrees, flexion 100 degrees  Neurological:     Mental Status: He is alert.    BP (!) 160/80   Pulse (!) 55   Temp 97.7 F (36.5 C)   Resp 16   Ht _0  (1.676 m)   Wt 166 lb (75.3 kg)   SpO2 97%   BMI 26.79 kg/m  Wt Readings from Last 3 Encounters:  10/11/20 166 lb (75.3 kg)  08/16/20 164 lb (74.4 kg)  07/13/20 165 lb (74.8 kg)     There are no preventive care reminders to display for this patient.  There are no preventive care reminders to display for this patient.  No results found for: TSH Lab Results  Component Value Date   WBC 6.5 05/09/2020   HGB 11.7 (L) 05/09/2020   HCT 36.8 (L) 05/09/2020   MCV 93 05/09/2020   PLT 98 (LL) 05/09/2020   Lab Results  Component Value Date   NA 143 07/07/2020   K 4.1 07/07/2020   CO2 20 07/07/2020   GLUCOSE 175 (H) 07/07/2020   BUN 16 07/07/2020   CREATININE 1.03 07/07/2020   BILITOT 0.6 05/09/2020   ALKPHOS 74 05/09/2020   AST 23 05/09/2020   ALT 12 05/09/2020   PROT 6.2 05/09/2020   ALBUMIN 4.1 05/09/2020   CALCIUM 9.3 07/07/2020   ANIONGAP 8 03/17/2020   EGFR 70 07/07/2020   Lab Results  Component Value Date   CHOL 139 07/07/2020   Lab Results  Component Value Date   HDL 38 (L) 07/07/2020   Lab Results  Component Value Date   LDLCALC 52 07/07/2020   Lab Results  Component Value Date   TRIG 322 (H) 07/07/2020   Lab Results  Component Value Date   CHOLHDL 3.7 07/07/2020   Lab Results  Component Value Date   HGBA1C 4.8 10/23/2019      Assessment & Plan:   Diagnoses and all orders for this visit: Acute bursitis of left shoulder  Left shoulder burisit/ possible chronic labral tear Inject left shoulder, no complications  After consent was obtained, using sterile technique the left shoulder was prepped and plain Ethyl Chloridewas used as local  anesthetic. The joint was entered and  Steroid 80 mg and 3 ml plain Lidocaine was then injected and the needle withdrawn.  The procedure was well tolerated.  The patient is asked to continue to rest the joint for a few more days before resuming regular activities.  It may be more painful for the first 1-2 days.  Watch for fever, or increased swelling or persistent pain in the joint. Call or return to clinic prn if such symptoms occur or there is failure to improve as anticipated.     Follow-up: Return if symptoms worsen or fail to improve.    [name], MD

## 2020-10-18 NOTE — Progress Notes (Signed)
[facility]   MULTIDISCIPLINARY HEART VALVE CLINIC                                       Cardiology Office Note    Date:  10/20/2020   ID:  Jack Hodges, DOB [birth year], MRN [id number]  PCP:  Abigail Miyamoto, MD  Cardiologist:  Dr. Bing Matter / Dr. Cornelius Moras & Dr. Excell Seltzer (TAVR)  CC: 1 year s/p TAVR  History of Present Illness:  Jack Hodges is a 87 y.o. male with a history of CAD s/p CABG in 1996 and subsequent PCI, HTN, PAD with left subclavian stenosis, remote history of stroke, HLD, peripheral neuropathy with bilateral foot drop, mild dementia, RBBB, 1st deg AV block, and severe AS s/p TAVR (10/27/19) who presents to clinic for follow up.   Patient's cardiac history dates back to 1996 when he presented with symptomatic coronary artery disease.  He underwent coronary artery bypass grafting x5 and did very well.  He has developed aortic stenosis that gradually progressed in severity on follow-up imaging.  In March 2021 he was hospitalized with atypical chest pain that was felt likely related to shingles.  He underwent follow-up echocardiogram that revealed significant progression and severity of aortic stenosis with preserved left ventricular systolic function. He subsequently underwent diagnostic cardiac catheterization on October 07, 2019.  He was noted to have severe native coronary artery disease with chronic total occlusion of the left anterior descending coronary artery, left circumflex coronary artery, and the right coronary arteries.  He had continued patency of the greater saphenous vein grafts to the diagonal branch which filled the left anterior descending coronary artery, the obtuse marginal branch, and sequential saphenous vein grafts to both the posterior descending and the posterior lateral branches.  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  There was significant high-grade stenosis in the left anterior descending coronary artery just beyond the diagonal  vein graft insertion site. He underwent successful PCI and stenting of the left anterior descending coronary artery at that time. Catheterization also confirmed the presence of aortic stenosis and revealed moderately elevated right-sided pressures.    He was evaluated by the multidisciplinary valve team and underwent successful TAVR with a 26 mm Edwards Sapien 3 THV via the TF approach on 10/27/19. Post operative echo showed EF 65%, normally functioning TAVR with a mean gradient of 9 mmHg and no PVL. He was continued on aspirin and plavix. Given underlying conduction dz, he was discharged with a Zio patch which did not show any HAVB. 1 month echo showed EF 65%, normally functioning TAVR with a mean gradient of 7 mm hg and no PVL. Mild MR. He did well in follow up. He was admitted in 02/2020 for Covid PNA.   Most recently seen by Dr. Bing Matter in April and started on a BB for essential tremor.   Today he presents to clinic for follow up. Here with son. BB helping with tremor. Has has worsening shortness of breath since having Covid. Also has chronic cough now. Not interested in vaccine. No CP. No LE edema, orthopnea or PND. No dizziness or syncope. No blood in stool or urine. No palpitations.      Past Medical History:  Diagnosis Date   Acute bursitis of left shoulder 01/04/2020   Acute respiratory failure with hypoxia (HCC) 03/17/2020   Angina pectoris (HCC) 10/07/2019   Arthritis  Atherosclerosis of aorta (HCC) 09/24/2019   Bilateral foot-drop 01/18/2017   BMI 27.0-27.9,adult 09/24/2019   Cellulitis of left hand 07/07/2019   Coronary artery disease involving native coronary artery of native heart without angina pectoris 11/01/2015   Overview:  Bypass surgery in 1996 Overview:  Overview:  Bypass surgery in 1996  Last Assessment & Plan:  Continue his current regimen.  Follow up as noted.   CVA (cerebral vascular accident) (HCC) 04/03/2016   Last Assessment & Plan:  The patient underwent extensive CVA  workup.  MRI is negative.  He does have aortic stenosis by echocardiogram.  Upon review of Care everywhere he is followed by Dr. Bing Matter, cardiologist for this.  Carotid ultrasound negative for hemodynamically significant stenosis.  Lipid panel within normal limits.  He has not had any further episodes of dizziness or nausea vomiting.    Dementia (HCC)    Depression    Dizziness 04/19/2016   DNR (do not resuscitate) 04/03/2016   Overview:  I had an extensive discussion with the patient on 04/03/2016 regarding his end of life wishes.  He and his daughter agree that do not resuscitate is in keeping with his beliefs.   Dyslipidemia 11/01/2015   Last Assessment & Plan:  Formatting of this note might be different from the original. Cholesterol 136   Triglycerides  83  HDL  50.1  LDL Calculated  69  VLDL Cholesterol Cal  17   Continue statin.   Dyspnea    with activity   Dyspnea on exertion 06/30/2018   Essential hypertension 11/01/2015   Last Assessment & Plan:  Resume home medications.  Follow-up as noted above.   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   Thrombocytopenia (HCC) 03/13/2020    Past Surgical History:  Procedure Laterality Date   CARDIAC CATHETERIZATION     CARDIAC SURGERY     CATARACT EXTRACTION,  BILATERAL     CORONARY ARTERY BYPASS GRAFT     CORONARY STENT INTERVENTION N/A 10/07/2019   Procedure: CORONARY STENT INTERVENTION;  Surgeon: Tonny Bollman, MD;  Location[facility]VAS[facility] CV LAB;  Service: Cardiovascular;  Laterality: N/A;   EYE SURGERY     HEMORROIDECTOMY     RIGHT/LEFT HEART CATH AND CORONARY/GRAFT ANGIOGRAPHY N/A 10/07/2019   Procedure: RIGHT/LEFT HEART CATH AND CORONARY/GRAFT ANGIOGRAPHY;  Surgeon: Tonny Bollman, MD;  Location: [facility] INVASIVE CV LAB;  Service: Cardiovascular;  Laterality: N/A;   TEE WITHOUT CARDIOVERSION N/A 10/27/2019   Procedure: TRANSESOPHAGEAL ECHOCARDIOGRAM (TEE);  Surgeon: Kathleene Hazel, MD;  Location: [facility] INVASIVE CV LAB;  Service: Open Heart Surgery;  Laterality: N/A;   TRANSCATHETER AORTIC VALVE REPLACEMENT, TRANSFEMORAL N/A 10/27/2019   Procedure: TRANSCATHETER AORTIC VALVE REPLACEMENT, TRANSFEMORAL;  Surgeon: Kathleene Hazel, MD;  Location: [facility] INVASIVE CV LAB;  Service: Open Heart  Surgery;  Laterality: N/A;    Current Medications: Outpatient Medications Prior to Visit  Medication Sig Dispense Refill   acetaminophen (TYLENOL) 650 MG CR tablet Take 650 mg by mouth every 8 (eight) hours as needed for pain.      ascorbic acid (VITAMIN C) 500 MG tablet Take 1 tablet (500 mg total) by mouth daily. 30 tablet 0   aspirin EC 81 MG tablet Take 81 mg by mouth daily.     cholecalciferol (VITAMIN D) 25 MCG (1000 UNIT) tablet Take 1,000 Units by mouth daily.     ezetimibe (ZETIA) 10 MG tablet TAKE 1 TABLET BY MOUTH  DAILY 90 tablet 3   gabapentin (NEURONTIN) 300 MG capsule Take 1 capsule (300 mg total) by mouth 3 (three) times daily. 270 capsule 2   lisinopril (ZESTRIL) 10 MG tablet Take 10 mg by mouth daily.     meclizine (ANTIVERT) 25 MG tablet Take 1 tablet (25 mg total) by mouth 3 (three) times daily as needed for dizziness. 30 tablet 0   metoprolol succinate (TOPROL XL) 25 MG 24 hr tablet Take 1 tablet (25 mg total) by mouth daily. 30 tablet 0    nitroGLYCERIN (NITROSTAT) 0.4 MG SL tablet Place 1 tablet (0.4 mg total) under the tongue every 5 (five) minutes as needed for chest pain. 25 tablet 2   pantoprazole (PROTONIX) 40 MG tablet Take 1 tablet (40 mg total) by mouth daily. 90 tablet 2   rosuvastatin (CRESTOR) 20 MG tablet TAKE 1 TABLET BY MOUTH AT  BEDTIME 90 tablet 2   sertraline (ZOLOFT) 50 MG tablet Take 1 tablet (50 mg total) by mouth daily. 90 tablet 2   vitamin B-12 (CYANOCOBALAMIN) 500 MCG tablet Take 500 mcg by mouth daily.     zinc sulfate 220 (50 Zn) MG capsule Take 1 capsule (220 mg total) by mouth daily. 30 capsule 0   clopidogrel (PLAVIX) 75 MG tablet Take 1 tablet (75 mg total) by mouth at bedtime. 90 tablet 2   albuterol (VENTOLIN HFA) 108 (90 Base) MCG/ACT inhaler Inhale 2 puffs into the lungs every 2 (two) hours as needed for wheezing or shortness of breath. 6.7 g 0   azithromycin (ZITHROMAX) 250 MG tablet 2 tablets on day 1, then 1 tablet daily on days 2-6. 6 each 0   guaiFENesin-codeine (CHERATUSSIN AC) 100-10 MG/5ML syrup Take 5 mLs by mouth 3 (three) times daily as needed. 120 mL 0   lisinopril (ZESTRIL) 10 MG tablet Take 0.5 tablets (5 mg total) by mouth daily. (Patient taking differently: Take 10 mg by mouth daily.) 90 tablet 1   No facility-administered medications prior to visit.     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  He has one son that lives with him and one daughter that lives beside of him. He is close with his family.  His sister cooks meals for him regularly.  He has friends at the cafe he visits regularly.   Social Determinants of Health   Financial Resource Strain: Low Risk    Difficulty of Paying Living Expenses: Not hard at all  Food Insecurity: No Food Insecurity   Worried About Programme researcher, broadcasting/film/video in the Last Year: Never true   Ran Out of Food in the Last Year: Never true  Transportation Needs: No Transportation Needs   Lack of Transportation (Medical): No   Lack of Transportation (Non-Medical): No  Physical Activity: Insufficiently Active   Days of Exercise per Week: 2 days   Minutes of Exercise per Session: 10 min  Stress: No Stress Concern Present   Feeling of Stress : Not at all  Social Connections: Moderately Isolated   Frequency of Communication with Friends and Family: More than three times a week   Frequency of Social Gatherings with Friends and Family: More than three times a week   Attends Religious Services: 1 to 4 times per year   Active Member of [employer] or Organizations: No   Attends Banker Meetings: Never   Marital Status: Widowed     Family History:  The patient's family history includes Arthritis in his brother and father; Cancer in his brother and mother; Diabetes in his mother; Hypertension in his father; Kidney disease in his mother; Migraines in his father; Sleep disorder in his father and mother; Stroke in his mother.     ROS:   Please see the history of present illness.    ROS All other systems reviewed and are negative.   PHYSICAL EXAM:   VS:  BP (!) 146/63   Pulse (!) 51   Ht 5\' 5"  (1.651 m)   Wt 163 lb 12.8 oz (74.3 kg)   SpO2 98%   BMI 27.26 kg/m    GEN: Well nourished, well developed, in no acute distress HEENT: normal Neck: no JVD  or masses Cardiac: RRR; soft flow murmurs @ RUSB. No rubs, or gallops,no edema  Respiratory:  clear to auscultation bilaterally, normal work of breathing GI: soft, nontender, nondistended, + BS MS: no deformity or atrophy Skin: warm and dry, no rash.  Neuro:  Alert and Oriented x 3, Strength and sensation are intact Psych: euthymic mood, full affect   Wt Readings from Last 3 Encounters:  10/19/20 163 lb 12.8 oz (74.3 kg)  10/11/20 166 lb (75.3 kg)  08/16/20 164 lb (74.4 kg)      Studies/Labs Reviewed:   EKG:  EKG is NOT ordered today.    Recent Labs: 03/17/2020: Magnesium 2.2 05/09/2020: ALT 12; Hemoglobin 11.7; Platelets 98 07/07/2020: BNP 122.2; BUN 16; Creatinine, Ser 1.03; Potassium 4.1; Sodium 143   Lipid Panel    Component Value Date/Time   CHOL 139 07/07/2020 1517   TRIG 322 (H) 07/07/2020 1517   HDL 38 (L) 07/07/2020 1517   CHOLHDL 3.7 07/07/2020 1517   LDLCALC 52 07/07/2020 1517    Additional studies/ records that were reviewed today include:   TAVR OPERATIVE NOTE     Date of Procedure:                10/27/2019   Preoperative Diagnosis:      Severe Aortic Stenosis    Postoperative Diagnosis:  Same    Procedure:        Transcatheter Aortic Valve Replacement - Percutaneous Right Transfemoral Approach             Edwards Sapien 3 Ultra THV (size 26 mm, model # 9750TFX, serial # [id number])              Co-Surgeons:                        Salvatore Decent. Cornelius Moras, MD and Verne Carrow, MD   Anesthesiologist:                  Kipp Brood, MD   Echocardiographer:              Thurmon Fair, MD   Pre-operative Echo Findings: Severe aortic stenosis Normal left ventricular systolic function   Post-operative Echo Findings: No paravalvular leak Normal left ventricular systolic function  _________________  Echo 10/28/19 IMPRESSIONS   1. Left ventricular ejection fraction, by estimation, is 65 to 70%. The  left ventricle has normal function. The left  ventricle has no regional  wall motion abnormalities. There is mild concentric left ventricular  hypertrophy. Left ventricular diastolic  parameters are consistent with Grade II diastolic dysfunction  (pseudonormalization). Elevated left atrial pressure.   2. Right ventricular systolic function is normal. The right ventricular  size is normal. There is normal pulmonary artery systolic pressure. The  estimated right ventricular systolic pressure is 22.5 mmHg.   3. Left atrial size was moderately dilated.   4. The mitral valve is normal in structure. Trivial mitral valve  regurgitation.   5. The aortic valve has been repaired/replaced. There is a 26 mm Edwards  Sapien prosthetic (TAVR) valve present in the aortic position. Procedure  Date: 10/27/2019. Aortic valve mean gradient measures 9.0 mmHg. Aortic  valve Vmax measures 2.04 m/s.   6. The inferior vena cava is normal in size with greater than 50%  respiratory variability, suggesting right atrial pressure of 3 mmHg.    _________________  Echo 11/26/19 IMPRESSIONS  1. 1 month post TAVR, normal transaortic gradients, no paravalvular leak.   2. Left ventricular ejection fraction, by estimation, is 65 to 70%. The  left ventricle has normal function. The left ventricle has no regional  wall motion abnormalities. There is moderate concentric left ventricular  hypertrophy. Left ventricular  diastolic parameters are consistent with Grade I diastolic dysfunction  (impaired relaxation).   3. Right ventricular systolic function is normal. The right ventricular  size is mildly enlarged. There is normal pulmonary artery systolic  pressure.   4. Left atrial size was moderately dilated.   5. The mitral valve is normal in structure. Mild mitral valve  regurgitation. No evidence of mitral stenosis.   6. The aortic valve is normal in structure. Aortic valve regurgitation is  not visualized. No aortic stenosis is present. There is a 26 mm Edwards   Sapien prosthetic (TAVR) valve present in the aortic position. Procedure  Date: 10/27/19. Aortic valve mean  gradient measures 7.0 mmHg.   7. The inferior vena cava is normal in size with greater than 50%  respiratory variability, suggesting right atrial pressure of 3 mmHg.   ________________________  Echo 10/19/20 IMPRESSIONS  1. Left ventricular ejection fraction, by estimation, is 55 to 60%. The left ventricle has normal function. The left ventricle has no regional wall motion abnormalities. There is mild concentric left ventricular hypertrophy. Left ventricular diastolic parameters are consistent with Grade  I diastolic dysfunction (impaired relaxation).  2. The aortic valve has been repaired/replaced. Aortic valve regurgitation is not visualized. Procedure Date: 10/27/2019. Aortic valve mean gradient measures 9.0 mmHg. Aortic valve Vmax measures 2.27 m/s. Aortic valve acceleration time measures 74 msec.  No evidence of prosthetic valve stenosis or PVL.  3. Right ventricular systolic function is normal. The right ventricular size is normal. There is normal pulmonary artery systolic pressure.  4. Left atrial size was mildly dilated.  5. The mitral valve is grossly normal. Trivial mitral valve regurgitation. No evidence of mitral stenosis.  6. The inferior vena cava is normal in size with greater than 50% respiratory variability, suggesting right atrial pressure of 3 mmHg.   Comparison(s): A prior study was performed on 11/26/19. No significant change from prior study.    ASSESSMENT & PLAN:   Severe AS s/p TAVR:  echo today shows EF 55%, normally functioning TAVR with a mean gradient of 9 mm hg and no PVL. He has NYHA class II symptoms, mostly related to recent Covid PNA. SBE prophylaxis discussed; he has amoxicillin. Continue on aspirin. He can discontinue Plavix at this time. Continue regular follow up with Dr. Bing MatterKrasowski.  HTN: BP borderline today. No changes made.   CAD s/p CABG: s/p  PCI for severe mid LAD stenosis just beyond the diagonal vein graft insertion site in 09/2019. Continue aspriin. He can come off plavix now as it has been grater than 6months since PCI. Continue statin and BB.    Medication Adjustments/Labs and Tests Ordered: Current medicines are reviewed at length with the patient today.  Concerns regarding medicines are outlined above.  Medication changes, Labs and Tests ordered today are listed in the Patient Instructions below. Patient Instructions  Medication Instructions:  Your physician has recommended you make the following change in your medication:  1.) stop clopidogrel (Plavix)  *If you need a refill on your cardiac medications before your next appointment, please call your pharmacy*   Lab Work: none If you have labs (blood work) drawn today and your tests are completely normal, you will receive your results only by: MyChart Message (if you have MyChart) OR A paper copy in the mail If you have any lab test that is abnormal or we need to change your treatment, we will call you to review the results.   Testing/Procedures: none   Follow-Up: As planned with Dr. Bing MatterKrasowski   Other Instructions      Signed, Cline CrockKathryn Gladis Soley, PA-C  10/20/2020 10:11 AM    [facility] [address], [place], [place]  [address] Phone: [phone]; Fax: [phone]

## 2020-10-19 ENCOUNTER — Ambulatory Visit: Payer: Medicare Other | Admitting: Physician Assistant

## 2020-10-19 ENCOUNTER — Encounter: Payer: Self-pay | Admitting: Physician Assistant

## 2020-10-19 ENCOUNTER — Other Ambulatory Visit: Payer: Self-pay

## 2020-10-19 ENCOUNTER — Ambulatory Visit (HOSPITAL_COMMUNITY): Payer: Medicare Other | Attending: Internal Medicine

## 2020-10-19 VITALS — BP 146/63 | HR 51 | Ht 65.0 in | Wt 163.8 lb

## 2020-10-19 DIAGNOSIS — Z951 Presence of aortocoronary bypass graft: Secondary | ICD-10-CM

## 2020-10-19 DIAGNOSIS — Z952 Presence of prosthetic heart valve: Secondary | ICD-10-CM

## 2020-10-19 DIAGNOSIS — I10 Essential (primary) hypertension: Secondary | ICD-10-CM

## 2020-10-19 LAB — ECHOCARDIOGRAM COMPLETE
AR max vel: 1.09 cm2
AV Area VTI: 1.18 cm2
AV Area mean vel: 1.17 cm2
AV Mean grad: 9 mmHg
AV Peak grad: 20.6 mmHg
Ao pk vel: 2.27 m/s
Area-P 1/2: 2.59 cm2
S' Lateral: 3.3 cm

## 2020-10-19 MED ORDER — ALBUTEROL SULFATE HFA 108 (90 BASE) MCG/ACT IN AERS
2.0000 | INHALATION_SPRAY | RESPIRATORY_TRACT | 1 refills | Status: DC | PRN
Start: 2020-10-19 — End: 2021-05-03

## 2020-10-19 NOTE — Patient Instructions (Signed)
Medication Instructions:  Your physician has recommended you make the following change in your medication:  1.) stop clopidogrel (Plavix)  *If you need a refill on your cardiac medications before your next appointment, please call your pharmacy*   Lab Work: none If you have labs (blood work) drawn today and your tests are completely normal, you will receive your results only by: MyChart Message (if you have MyChart) OR A paper copy in the mail If you have any lab test that is abnormal or we need to change your treatment, we will call you to review the results.   Testing/Procedures: none   Follow-Up: As planned with Dr. Bing Matter   Other Instructions

## 2020-10-25 DIAGNOSIS — H43813 Vitreous degeneration, bilateral: Secondary | ICD-10-CM | POA: Diagnosis not present

## 2020-10-25 DIAGNOSIS — Z961 Presence of intraocular lens: Secondary | ICD-10-CM | POA: Diagnosis not present

## 2020-10-25 DIAGNOSIS — H16223 Keratoconjunctivitis sicca, not specified as Sjogren's, bilateral: Secondary | ICD-10-CM | POA: Diagnosis not present

## 2020-10-25 DIAGNOSIS — H02831 Dermatochalasis of right upper eyelid: Secondary | ICD-10-CM | POA: Diagnosis not present

## 2020-10-26 ENCOUNTER — Other Ambulatory Visit: Payer: Self-pay

## 2020-10-26 DIAGNOSIS — K2100 Gastro-esophageal reflux disease with esophagitis, without bleeding: Secondary | ICD-10-CM

## 2020-10-26 DIAGNOSIS — F32A Depression, unspecified: Secondary | ICD-10-CM

## 2020-10-26 MED ORDER — SERTRALINE HCL 50 MG PO TABS: 50.0000 mg | ORAL_TABLET | Freq: Every day | ORAL | 2 refills | Status: DC

## 2020-10-26 MED ORDER — PANTOPRAZOLE SODIUM 40 MG PO TBEC: 40.0000 mg | DELAYED_RELEASE_TABLET | Freq: Every day | ORAL | 2 refills | Status: DC

## 2020-10-26 NOTE — Progress Notes (Signed)
Patient requested refill for Zoloft and Pantoprazole be sent to [facility] mail order pharmacy

## 2020-10-28 ENCOUNTER — Telehealth: Payer: Self-pay

## 2020-10-28 NOTE — Chronic Care Management (AMB) (Signed)
Chronic Care Management Pharmacy Assistant   Name: Jack Hodges  MRN: [id number] DOB: [birth year]  Reason for Encounter: Hypertension Disease State Call   Recent office visits:  10/11/20- Jack Bulla, MD- seen for left shoulder pain, steroid injection administered, no medication changes, no follow up documented   Recent consult visits:  10/19/20- Jack Crock, PA-C ( Cardiology)- seen for 1 yr follow up of TAVR, discontinued clopidogrel 75 mg, noted increased lisinopril from 5 mg to 10 mg daily, follow up as scheduled   Hospital visits:  None in previous 6 months  Medications: Outpatient Encounter Medications as of 10/28/2020  Medication Sig   acetaminophen (TYLENOL) 650 MG CR tablet Take 650 mg by mouth every 8 (eight) hours as needed for pain.    albuterol (VENTOLIN HFA) 108 (90 Base) MCG/ACT inhaler Inhale 2 puffs into the lungs every 2 (two) hours as needed for wheezing or shortness of breath.   ascorbic acid (VITAMIN C) 500 MG tablet Take 1 tablet (500 mg total) by mouth daily.   aspirin EC 81 MG tablet Take 81 mg by mouth daily.   cholecalciferol (VITAMIN D) 25 MCG (1000 UNIT) tablet Take 1,000 Units by mouth daily.   ezetimibe (ZETIA) 10 MG tablet TAKE 1 TABLET BY MOUTH  DAILY   gabapentin (NEURONTIN) 300 MG capsule Take 1 capsule (300 mg total) by mouth 3 (three) times daily.   lisinopril (ZESTRIL) 10 MG tablet Take 10 mg by mouth daily.   meclizine (ANTIVERT) 25 MG tablet Take 1 tablet (25 mg total) by mouth 3 (three) times daily as needed for dizziness.   metoprolol succinate (TOPROL XL) 25 MG 24 hr tablet Take 1 tablet (25 mg total) by mouth daily.   nitroGLYCERIN (NITROSTAT) 0.4 MG SL tablet Place 1 tablet (0.4 mg total) under the tongue every 5 (five) minutes as needed for chest pain.   pantoprazole (PROTONIX) 40 MG tablet Take 1 tablet (40 mg total) by mouth daily.   rosuvastatin (CRESTOR) 20 MG tablet TAKE 1 TABLET BY MOUTH AT  BEDTIME   sertraline (ZOLOFT) 50  MG tablet Take 1 tablet (50 mg total) by mouth daily.   vitamin B-12 (CYANOCOBALAMIN) 500 MCG tablet Take 500 mcg by mouth daily.   zinc sulfate 220 (50 Zn) MG capsule Take 1 capsule (220 mg total) by mouth daily.   No facility-administered encounter medications on file as of 10/28/2020.    Recent Office Vitals: BP Readings from Last 3 Encounters:  10/19/20 (!) 146/63  10/11/20 (!) 160/80  08/16/20 136/74   Pulse Readings from Last 3 Encounters:  10/19/20 (!) 51  10/11/20 (!) 55  08/16/20 82    Wt Readings from Last 3 Encounters:  10/19/20 163 lb 12.8 oz (74.3 kg)  10/11/20 166 lb (75.3 kg)  08/16/20 164 lb (74.4 kg)     Kidney Function Lab Results  Component Value Date/Time   CREATININE 1.03 07/07/2020 03:17 PM   CREATININE 0.80 06/01/2020 10:12 AM   GFRNONAA 78 05/09/2020 08:26 AM   GFRNONAA >60 03/17/2020 12:44 AM   GFRAA 90 05/09/2020 08:26 AM    BMP Latest Ref Rng & Units 07/07/2020 06/01/2020 05/09/2020  Glucose 65 - 99 mg/dL 163(A) 85 87  BUN 8 - 27 mg/dL 16 14 10   Creatinine 0.76 - 1.27 mg/dL 4.53 6.46  BUN/Creat Ratio 10 - 24 16 18 12   Sodium 134 - 144 mmol/L 143 143 146(H)  Potassium 3.5 - 5.2 mmol/L 4.1 4.4 4.3  Chloride 96 -  106 mmol/L 107(H) 107(H) 111(H)  CO2 20 - 29 mmol/L 20 21 18(L)  Calcium 8.6 - 10.2 mg/dL 9.3 9.3 8.9   Answers were provided by both patient and his son.   Current antihypertensive regimen:  Lisinopril 10 mg daily Metoprolol succinate 25 mg daily   Patient verbally confirms he is taking the above medications as directed. Yes  How often are you checking your Blood Pressure?  Patient's son stated he checks his BP twice daily   he checks his blood pressure in the morning and in the evening before taking his medication.  Current home BP readings: Respectively   DATE:             BP                 PULSE 07/30   117/69,141/58  60,62 07/31  107/65,  119/71 57,55  08/01  784/69,629/52  54,52 08/02  109/69,124/71  59,52 08/03  841/32,440/10  53,50  08/04  128/64,128/72  50,55 08/05  162/76   52  Wrist or arm cuff: arm Caffeine intake: daily Salt intake: daily without moderation OTC medications including pseudoephedrine or NSAIDs? Daily aspirin, occasional tylenol   Any readings above 180/120? No  What recent interventions/DTPs have been made by any provider to improve Blood Pressure control since last CPP Visit:  10/19/20- Jack Crock, PA-C ( Cardiology) discontinued clopidogrel 75 mg, increased lisinopril from 5 mg to 10 mg daily  Any recent hospitalizations or ED visits since last visit with CPP? No   What diet changes have been made to improve Blood Pressure Control?  Patient's son stated Jack Hodges diet is unchanged and without restrictions. I encouraged him to begin limiting salt in Jack Hodges diet  What exercise is being done to improve your Blood Pressure Control?  Patient's son stated Jack Hodges remains active and has no issues wit mobility  Adherence Review: Is the patient currently on ACE/ARB medication? Yes Does the patient have >5 day gap between last estimated fill dates? Yes, I spoke with both Jack Hodges and his son, Aurther Hodges, about this. [name] currently has a 10 DS remaining of metoprolol. He is also completely out of lisinopril and rosuvastatin. Jack Hodges stated that he is no longer taking rosuvastatin, however no indications to discontinue this were made in his chart. I attempted to reach provider's office, but they were closed. I advised them that I would pursue clarification from CPP about rosuvastatin. Aurther Hodges, Jack Hodges son, agreed to call [facility] to request refills for both metoprolol and lisinopril today. A cost analysis was attempted in pursuit of pill packaging, but was unsuccessful due to out of range zipcode.  Metoprolol succinate 25 mg- 90 DS last filled 07/07/20  Care Gaps: Last annual wellness  visit? 07/13/20  Star Rating Drugs:  Medication:   Last Fill: Day Supply Lisinopril 10 mg  05/26/20 90 DS Rosuvastatin 20 mg  05/17/20 90 DS  Jack Hodges CPA, CMA

## 2020-10-31 NOTE — Telephone Encounter (Cosign Needed)
I called and spoke with [facility]. A refill request was made for lisinopril, metoprolol, rosuvastatin, and zetia as it had not been previously requested.  I spoke with [name], Mr. Wolf son, and he is aware that Mr. Prohaska should be taking rosuvastatin as prescribed. He also expressed understanding that these four medications would be delivered within the next 3-5 business days and should be taken upon receipt as directed.   Total Time: 15 minutes  Purvis Kilts [employer], CMA

## 2021-01-01 ENCOUNTER — Other Ambulatory Visit: Payer: Self-pay | Admitting: Legal Medicine

## 2021-01-01 ENCOUNTER — Other Ambulatory Visit: Payer: Self-pay | Admitting: Cardiology

## 2021-01-02 NOTE — Telephone Encounter (Signed)
Refill sent to pharmacy.   

## 2021-01-12 ENCOUNTER — Ambulatory Visit (INDEPENDENT_AMBULATORY_CARE_PROVIDER_SITE_OTHER): Payer: Medicare Other

## 2021-01-12 DIAGNOSIS — Z23 Encounter for immunization: Secondary | ICD-10-CM

## 2021-01-19 ENCOUNTER — Encounter: Payer: Self-pay | Admitting: Legal Medicine

## 2021-01-19 ENCOUNTER — Ambulatory Visit (INDEPENDENT_AMBULATORY_CARE_PROVIDER_SITE_OTHER): Payer: Medicare Other | Admitting: Legal Medicine

## 2021-01-19 DIAGNOSIS — M7062 Trochanteric bursitis, left hip: Secondary | ICD-10-CM

## 2021-01-19 HISTORY — DX: Trochanteric bursitis, left hip: M70.62

## 2021-01-19 NOTE — Progress Notes (Signed)
Acute Office Visit  Subjective:    Patient ID: Jack Hodges, male    DOB: 1931/03/16, 87 y.o.   MRN: [id number]  Chief Complaint  Patient presents with   Hip Pain    Right Hip pain.    HPI: Patient is in today for right hip pain worsening for one months, worse when he gets up. He had injections in past.  Past Medical History:  Diagnosis Date   Acute bursitis of left shoulder 01/04/2020   Acute respiratory failure with hypoxia ([place]) 03/17/2020   Angina pectoris () 10/07/2019   Arthritis    Atherosclerosis of aorta ([place]) 09/24/2019   Bilateral foot-drop 01/18/2017   BMI 27.0-27.9,adult 09/24/2019   Cellulitis of left hand 07/07/2019   Coronary artery disease involving native coronary artery of native heart without angina pectoris 11/01/2015   Overview:  Bypass surgery in 1996 Overview:  Overview:  Bypass surgery in [place]:  Continue his current regimen.  Follow up as noted.   CVA (cerebral vascular accident) ([place]) 04/03/2016   Last Assessment & Plan:  The patient underwent extensive CVA workup.  MRI is negative.  He does have aortic stenosis by echocardiogram.  Upon review of Care everywhere he is followed by Dr. Agustin Cree, cardiologist for this.  Carotid ultrasound negative for hemodynamically significant stenosis.  Lipid panel within normal limits.  He has not had any further episodes of dizziness or nausea vomiting.    Dementia ([place])    Depression    Dizziness 04/19/2016   DNR (do not resuscitate) 04/03/2016   Overview:  I had an extensive discussion with the patient on 04/03/2016 regarding his end of life wishes.  He and his daughter agree that do not resuscitate is in keeping with his beliefs.   Dyslipidemia 11/01/2015   Last Assessment & Plan:  Formatting of this note might be different from the original. Cholesterol 136   Triglycerides  83  HDL  50.1  LDL Calculated  69  VLDL Cholesterol Cal  17   Continue statin.   Dyspnea    with activity   Dyspnea on  exertion 06/30/2018   Essential hypertension 11/01/2015   Last Assessment & Plan:  Resume home medications.  Follow-up as noted above.   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   Thrombocytopenia ([place]) 03/13/2020    Past Surgical History:  Procedure Laterality Date   CARDIAC CATHETERIZATION     CARDIAC SURGERY     CATARACT EXTRACTION, BILATERAL     CORONARY ARTERY BYPASS GRAFT     CORONARY STENT INTERVENTION N/A 10/07/2019   Procedure: CORONARY STENT INTERVENTION;  Surgeon: Sherren Mocha, MD;  Location: [facility];  Service: Cardiovascular;  Laterality: N/A;   EYE SURGERY     HEMORROIDECTOMY  RIGHT/LEFT HEART CATH AND CORONARY/GRAFT ANGIOGRAPHY N/A 10/07/2019   Procedure: RIGHT/LEFT HEART CATH AND CORONARY/GRAFT ANGIOGRAPHY;  Surgeon: Sherren Mocha, MD;  Location: [facility];  Service: Cardiovascular;  Laterality: N/A;   TEE WITHOUT CARDIOVERSION N/A 10/27/2019   Procedure: TRANSESOPHAGEAL ECHOCARDIOGRAM (TEE);  Surgeon: Burnell Blanks, MD;  Location: [facility];  Service: Open Heart Surgery;  Laterality: N/A;   TRANSCATHETER AORTIC VALVE REPLACEMENT, TRANSFEMORAL N/A 10/27/2019   Procedure: TRANSCATHETER AORTIC VALVE REPLACEMENT, TRANSFEMORAL;  Surgeon: Burnell Blanks, MD;  Location: [facility];  Service: Open Heart Surgery;  Laterality: N/A;    Family History  Problem Relation Age of Onset   Cancer Mother    Diabetes Mother    Kidney disease Mother    Stroke Mother    Sleep disorder Mother    Arthritis Father    Migraines Father    Hypertension Father    Sleep disorder Father    Arthritis Brother    Cancer Brother     Social History   Socioeconomic History   Marital status: Widowed    Spouse name: Curly [place]   Number of children: 2   Years of education: Not on file   Highest education level: Not on file  Occupational History   Occupation: Retired    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  He has one son that lives with him and one daughter that lives beside of him. He is close with his family.  His sister cooks meals for him regularly.  He has friends at the cafe he visits regularly.   Social Determinants of Health   Financial Resource Strain: Low Risk    Difficulty of Paying Living Expenses: Not hard at all  Food Insecurity: No Food Insecurity   Worried About Charity fundraiser in the Last Year: Never true   [place] in the Last Year: Never true  Transportation Needs: No Transportation Needs   Lack of Transportation (Medical): No   Lack of Transportation (Non-Medical): No  Physical Activity: Insufficiently Active   Days of Exercise per Week: 2 days   Minutes of Exercise per Session: 10 min  Stress: No Stress Concern Present   Feeling of Stress : Not at all  Social Connections: Moderately Isolated    Frequency of Communication with Friends and Family: More than three times a week   Frequency of Social Gatherings with Friends and Family: More than three times a week   Attends Religious Services: 1 to 4 times per year   Active Member of [employer] or Organizations: No   Attends Archivist Meetings: Never   Marital Status: Widowed  Human resources officer Violence: Not At Risk   Fear of Current or Ex-Partner: No   Emotionally Abused: No   Physically Abused: No   Sexually Abused: No    Outpatient Medications Prior to Visit  Medication Sig Dispense Refill   acetaminophen (TYLENOL) 650 MG CR tablet Take 650 mg  by mouth every 8 (eight) hours as needed for pain.      albuterol (VENTOLIN HFA) 108 (90 Base) MCG/ACT inhaler Inhale 2 puffs into the lungs every 2 (two) hours as needed for wheezing or shortness of breath. 6.7 g 1   ascorbic acid (VITAMIN C) 500 MG tablet Take 1 tablet (500 mg total) by mouth daily. 30 tablet 0   aspirin EC 81 MG tablet Take 81 mg by mouth daily.     cholecalciferol (VITAMIN D) 25 MCG (1000 UNIT) tablet Take 1,000 Units by mouth daily.     ezetimibe (ZETIA) 10 MG tablet TAKE 1 TABLET BY MOUTH  DAILY 90 tablet 3   fluorometholone (FML) 0.1 % ophthalmic suspension SMARTSIG:In Eye(s)     gabapentin (NEURONTIN) 300 MG capsule Take 1 capsule (300 mg total) by mouth 3 (three) times daily. 270 capsule 2   lisinopril (ZESTRIL) 5 MG tablet Take 1 tablet (5 mg total) by mouth daily. 90 tablet 1   meclizine (ANTIVERT) 25 MG tablet Take 1 tablet (25 mg total) by mouth 3 (three) times daily as needed for dizziness. 30 tablet 0   metoprolol succinate (TOPROL XL) 25 MG 24 hr tablet Take 1 tablet (25 mg total) by mouth daily. 30 tablet 0   pantoprazole (PROTONIX) 40 MG tablet Take 1 tablet (40 mg total) by mouth daily. 90 tablet 2   rosuvastatin (CRESTOR) 20 MG tablet TAKE 1 TABLET BY MOUTH AT  BEDTIME 90 tablet 3   sertraline (ZOLOFT) 50 MG tablet Take 1 tablet (50 mg total) by  mouth daily. 90 tablet 2   vitamin B-12 (CYANOCOBALAMIN) 500 MCG tablet Take 500 mcg by mouth daily.     zinc sulfate 220 (50 Zn) MG capsule Take 1 capsule (220 mg total) by mouth daily. 30 capsule 0   nitroGLYCERIN (NITROSTAT) 0.4 MG SL tablet Place 1 tablet (0.4 mg total) under the tongue every 5 (five) minutes as needed for chest pain. 25 tablet 2   No facility-administered medications prior to visit.    Allergies  Allergen Reactions   Poison Oak Extract Rash    Review of Systems  Constitutional:  Negative for chills, fatigue, fever and unexpected weight change.  HENT:  Negative for congestion, ear pain, sinus pain and sore throat.   Respiratory:  Positive for cough.   Cardiovascular:  Negative for chest pain and palpitations.  Gastrointestinal:  Negative for abdominal pain, blood in stool, constipation, diarrhea, nausea and vomiting.  Endocrine: Negative for polydipsia.  Genitourinary:  Negative for dysuria.  Musculoskeletal:  Positive for arthralgias (Right Hip pain). Negative for back pain.  Skin:  Negative for rash.  Neurological:  Negative for headaches.      Objective:    Physical Exam Vitals reviewed.  Constitutional:      General: He is not in acute distress.    Appearance: Normal appearance.  HENT:     Head: Normocephalic.     Right Ear: Tympanic membrane normal.     Left Ear: Tympanic membrane normal.  Eyes:     Extraocular Movements: Extraocular movements intact.     Conjunctiva/sclera: Conjunctivae normal.     Pupils: Pupils are equal, round, and reactive to light.  Cardiovascular:     Rate and Rhythm: Normal rate and regular rhythm.     Pulses: Normal pulses.     Heart sounds: Normal heart sounds. No murmur heard.   No gallop.  Pulmonary:     Effort: Pulmonary effort is normal. No respiratory  distress.     Breath sounds: Normal breath sounds. No wheezing.  Abdominal:     General: Abdomen is flat. Bowel sounds are normal. There is no distension.      Tenderness: There is no abdominal tenderness.  Musculoskeletal:       Legs:     Comments: Pain trochanteric bursa,, full rom hip on right  Neurological:     Mental Status: He is alert.    BP (!) 200/80   Pulse 66   Temp (!) 97.3 F (36.3 C)   Resp 16   Ht '5\' 5"'  (1.651 m)   Wt 171 lb (77.6 kg)   SpO2 97%   BMI 28.46 kg/m  Wt Readings from Last 3 Encounters:  01/19/21 171 lb (77.6 kg)  10/19/20 163 lb 12.8 oz (74.3 kg)  10/11/20 166 lb (75.3 kg)    Health Maintenance Due  Topic Date Due   Zoster Vaccines- Shingrix (2 of 2) 10/28/2020    There are no preventive care reminders to display for this patient.   No results found for: TSH Lab Results  Component Value Date   WBC 6.5 05/09/2020   HGB 11.7 (L) 05/09/2020   HCT 36.8 (L) 05/09/2020   MCV 93 05/09/2020   PLT 98 (LL) 05/09/2020   Lab Results  Component Value Date   NA 143 07/07/2020   K 4.1 07/07/2020   CO2 20 07/07/2020   GLUCOSE 175 (H) 07/07/2020   BUN 16 07/07/2020   CREATININE 1.03 07/07/2020   BILITOT 0.6 05/09/2020   ALKPHOS 74 05/09/2020   AST 23 05/09/2020   ALT 12 05/09/2020   PROT 6.2 05/09/2020   ALBUMIN 4.1 05/09/2020   CALCIUM 9.3 07/07/2020   ANIONGAP 8 03/17/2020   EGFR 70 07/07/2020   Lab Results  Component Value Date   CHOL 139 07/07/2020   Lab Results  Component Value Date   HDL 38 (L) 07/07/2020   Lab Results  Component Value Date   LDLCALC 52 07/07/2020   Lab Results  Component Value Date   TRIG 322 (H) 07/07/2020   Lab Results  Component Value Date   CHOLHDL 3.7 07/07/2020   Lab Results  Component Value Date   HGBA1C 4.8 10/23/2019       Assessment & Plan:   Problem List Items Addressed This Visit       Musculoskeletal and Integument   Trochanteric bursitis of left hip  Inject trochanteric bursa  After consent was obtained, using sterile technique the left trochanteric bursa was prepped and plain with Ethyl Chloride was used as local anesthetic. The  joint was entered and  Steroid 80 mg and 3 ml plain Lidocaine was then injected and the needle withdrawn.  The procedure was well tolerated.  The patient is asked to continue to rest the joint for a few more days before resuming regular activities.  It may be more painful for the first 1-2 days.  Watch for fever, or increased swelling or persistent pain in the joint. Call or return to clinic prn if such symptoms occur or there is failure to improve as anticipated.         Follow-up: Return if symptoms worsen or fail to improve, for nuse check bp 2 week.  An After Visit Summary was printed and given to the patient.  [name], MD [facility] [phone]

## 2021-01-26 ENCOUNTER — Other Ambulatory Visit: Payer: Self-pay

## 2021-01-26 ENCOUNTER — Ambulatory Visit (INDEPENDENT_AMBULATORY_CARE_PROVIDER_SITE_OTHER): Payer: Medicare Other

## 2021-01-26 DIAGNOSIS — I700 Atherosclerosis of aorta: Secondary | ICD-10-CM

## 2021-01-26 DIAGNOSIS — F32A Depression, unspecified: Secondary | ICD-10-CM

## 2021-01-26 DIAGNOSIS — I10 Essential (primary) hypertension: Secondary | ICD-10-CM

## 2021-01-26 DIAGNOSIS — E785 Hyperlipidemia, unspecified: Secondary | ICD-10-CM

## 2021-01-26 NOTE — Patient Instructions (Signed)
Visit Information   Goals Addressed   None    Patient Care Plan: CCM Pharmacy Care Plan     Problem Identified: cad, htn and hld   Priority: High  Onset Date: 07/27/2020     Goal: Disease State Management   Start Date: 07/27/2020  Expected End Date: 07/27/2021  Recent Progress: On track  Priority: High  Note:    Current Barriers:  Unable to self administer medications as prescribed  Pharmacist Clinical Goal(s):  Patient will adhere to prescribed medication regimen as evidenced by fill history through collaboration with PharmD and provider.   Interventions: 1:1 collaboration with Abigail Miyamoto, MD regarding development and update of comprehensive plan of care as evidenced by provider attestation and co-signature Inter-disciplinary care team collaboration (see longitudinal plan of care) Comprehensive medication review performed; medication list updated in electronic medical record  Hypertension (BP goal <130/80) -Controlled -Current treatment: Lisinopril 5 mg daily  Metoprolol succinate 25 mg daily  -Medications previously tried:  none reported  -Current home readings:    -01/26/21: 139/88 and 75 -01/25/21: 128/86  -Current dietary habits: eats with his sister -Current exercise habits: walks around house with a cane -Denies hypotensive/hypertensive symptoms -Educated on BP goals and benefits of medications for prevention of heart attack, stroke and kidney damage; Daily salt intake goal < 2300 mg; Exercise goal of 150 minutes per week; -Counseled to monitor BP at home daily, document, and provide log at future appointments -Counseled on diet and exercise extensively Recommended to continue current medication  Hyperlipidemia/CAD: (LDL goal < 55) -Controlled -Current treatment: rosuvastatin 20 mg daily  Aspirin 81 mg daily  Clopidogrel 75 daily at bedtime  -Medications previously tried: none reported  -Current dietary patterns: eats with his sister some   -Current exercise habits: has foot drop and walks with a cane -Educated on Cholesterol goals;  Benefits of statin for ASCVD risk reduction; Importance of limiting foods high in cholesterol; -Counseled on diet and exercise extensively Recommended to continue current medication   Depression/Anxiety -Controlled -Current treatment: Sertraline 50mg  -Medications previously tried/failed:  -PHQ9:  Depression screen [facility] 2/9 07/13/2020 08/05/2019  Decreased Interest 1 1  Down, Depressed, Hopeless 1 1  PHQ - 2 Score 2 2  Altered sleeping 0 -  Tired, decreased energy 0 -  Change in appetite 0 -  Feeling bad or failure about yourself  0 -  Trouble concentrating 0 -  Moving slowly or fidgety/restless 0 -  Suicidal thoughts 0 -  PHQ-9 Score 2 -  -GAD7: No flowsheet data found. -Educated on Benefits of medication for symptom control November 2022: Will ask PCP to change to SNRI for neuropathy   Chronic Pain -Uncontrolled -Current treatment: Gabapentin -Medications previously tried: N/A  -Pain Scale Without Meds: 6/10  With Meds: 6/10  Aggravating Factors: Movement  Pain Type: Neuropathy  November 2022: Will ask for SNRI to help    Patient Goals/Self-Care Activities Patient will:  - take medications as prescribed focus on medication adherence by calling office or pharmacy when refill is needed check blood pressure daily, document, and provide at future appointments engage in dietary modifications by limiting salt and avoiding high fat/fried foods  Follow Up Plan: Telephone follow up appointment with care management team member scheduled for: March 2023  April 2023, Artelia Laroche.D. - [phone]       The patient verbalized understanding of instructions, educational materials, and care plan provided today and declined offer to receive copy of patient instructions, educational materials, and care plan.  The pharmacy team will reach out to the patient again over the next 90  days.   Zettie Pho, [facility]

## 2021-01-26 NOTE — Progress Notes (Signed)
Chronic Care Management Pharmacy Note  01/26/2021 Name:  Jack Hodges MRN:  [id number] DOB:  September 29, 1930   Plan Updates:  Patient's neuropathy not controlled, will ask PCP about changing SSRI to SNRI for mental health AND neuropathy control  Subjective: Jack Hodges is an 87 y.o. year old male who is a primary patient of Henrene Pastor, Zeb Comfort, MD.  The CCM team was consulted for assistance with disease management and care coordination needs.    Engaged with patient by telephone for follow up visit in response to provider referral for pharmacy case management and/or care coordination services.   Consent to Services:  The patient was given the following information about Chronic Care Management services today, agreed to services, and gave verbal consent: 1. CCM service includes personalized support from designated clinical staff supervised by the primary care provider, including individualized plan of care and coordination with other care providers 2. 24/7 contact phone numbers for assistance for urgent and routine care needs. 3. Service will only be billed when office clinical staff spend 20 minutes or more in a month to coordinate care. 4. Only one practitioner may furnish and bill the service in a calendar month. 5.The patient may stop CCM services at any time (effective at the end of the month) by phone call to the office staff. 6. The patient will be responsible for cost sharing (co-pay) of up to 20% of the service fee (after annual deductible is met). Patient agreed to services and consent obtained.  Patient Care Team: Lillard Anes, MD as PCP - General (Family Medicine) Park Liter, MD as PCP - Cardiology (Cardiology) Burnice Logan, [facility] (Inactive) as Pharmacist (Pharmacist) Lane Hacker, [facility] as Pharmacist (Pharmacist)  Recent office visits:  10/11/20- Jack Meeker, MD- seen for left shoulder pain, steroid injection administered, no medication changes, no  follow up documented    Recent consult visits:  10/19/20- Angelena Form, PA-C ( Cardiology)- seen for 1 yr follow up of TAVR, discontinued clopidogrel 75 mg, noted increased lisinopril from 5 mg to 10 mg daily, follow up as scheduled    Hospital visits:  None in previous 6 months   Objective:  Lab Results  Component Value Date   CREATININE 1.03 07/07/2020   BUN 16 07/07/2020   GFRNONAA 78 05/09/2020   GFRAA 90 05/09/2020   NA 143 07/07/2020   K 4.1 07/07/2020   CALCIUM 9.3 07/07/2020   CO2 20 07/07/2020   GLUCOSE 175 (H) 07/07/2020    Lab Results  Component Value Date/Time   HGBA1C 4.8 10/23/2019 09:55 AM    Last diabetic Eye exam: No results found for: HMDIABEYEEXA  Last diabetic Foot exam: No results found for: HMDIABFOOTEX   Lab Results  Component Value Date   CHOL 139 07/07/2020   HDL 38 (L) 07/07/2020   LDLCALC 52 07/07/2020   TRIG 322 (H) 07/07/2020   CHOLHDL 3.7 07/07/2020    Hepatic Function Latest Ref Rng & Units 05/09/2020 03/17/2020 03/16/2020  Total Protein 6.0 - 8.5 g/dL 6.2 5.5(L) 5.6(L)  Albumin 3.6 - 4.6 g/dL 4.1 2.9(L) 3.1(L)  AST 0 - 40 IU/L 23 39 28  ALT 0 - 44 IU/L 12 44 27  Alk Phosphatase 44 - 121 IU/L 74 51 60  Total Bilirubin 0.0 - 1.2 mg/dL 0.6 0.8 0.9  Bilirubin, Direct 0.0 - 0.2 mg/dL - - -    No results found for: TSH, FREET4  CBC Latest Ref Rng & Units 05/09/2020 03/15/2020 03/13/2020  WBC 3.4 - 10.8 x10E3/uL 6.5 10.1 11.6(H)  Hemoglobin 13.0 - 17.7 g/dL 11.7(L) 11.1(L) 11.8(L)  Hematocrit 37.5 - 51.0 % 36.8(L) 31.4(L) 35.2(L)  Platelets 150 - 450 x10E3/uL 98(LL) 130(L) 99(L)    No results found for: VD25OH  Clinical ASCVD: Yes  The ASCVD Risk score (Arnett DK, et al., 2019) failed to calculate for the following reasons:   The 2019 ASCVD risk score is only valid for ages 52 to 36   The patient has a prior MI or stroke diagnosis    Depression screen [facility] 2/9 07/13/2020 08/05/2019  Decreased Interest 1 1  Down, Depressed,  Hopeless 1 1  PHQ - 2 Score 2 2  Altered sleeping 0 -  Tired, decreased energy 0 -  Change in appetite 0 -  Feeling bad or failure about yourself  0 -  Trouble concentrating 0 -  Moving slowly or fidgety/restless 0 -  Suicidal thoughts 0 -  PHQ-9 Score 2 -      Social History   Tobacco Use  Smoking Status Former   Types: Cigarettes, Cigars   Quit date: 05/02/1962   Years since quitting: 58.7  Smokeless Tobacco Never   BP Readings from Last 3 Encounters:  01/19/21 (!) 200/80  10/19/20 (!) 146/63  10/11/20 (!) 160/80   Pulse Readings from Last 3 Encounters:  01/19/21 66  10/19/20 (!) 51  10/11/20 (!) 55   Wt Readings from Last 3 Encounters:  01/19/21 171 lb (77.6 kg)  10/19/20 163 lb 12.8 oz (74.3 kg)  10/11/20 166 lb (75.3 kg)   BMI Readings from Last 3 Encounters:  01/19/21 28.46 kg/m  10/19/20 27.26 kg/m  10/11/20 26.79 kg/m    Assessment/Interventions: Review of patient past medical history, allergies, medications, health status, including review of consultants reports, laboratory and other test data, was performed as part of comprehensive evaluation and provision of chronic care management services.   SDOH:  (Social Determinants of Health) assessments and interventions performed: Yes SDOH Interventions    Flowsheet Row Most Recent Value  SDOH Interventions   Financial Strain Interventions Intervention Not Indicated      SDOH Screenings   Alcohol Screen: Low Risk    Last Alcohol Screening Score (AUDIT): 0  Depression (PHQ2-9): Low Risk    PHQ-2 Score: 2  Financial Resource Strain: Low Risk    Difficulty of Paying Living Expenses: Not hard at all  Food Insecurity: No Food Insecurity   Worried About Charity fundraiser in the Last Year: Never true   Ran Out of Food in the Last Year: Never true  Housing: Low Risk    Last Housing Risk Score: 0  Physical Activity: Insufficiently Active   Days of Exercise per Week: 2 days   Minutes of Exercise per  Session: 10 min  Social Connections: Moderately Isolated   Frequency of Communication with Friends and Family: More than three times a week   Frequency of Social Gatherings with Friends and Family: More than three times a week   Attends Religious Services: 1 to 4 times per year   Active Member of [employer] or Organizations: No   Attends Archivist Meetings: Never   Marital Status: Widowed  Stress: No Stress Concern Present   Feeling of Stress : Not at all  Tobacco Use: Medium Risk   Smoking Tobacco Use: Former   Smokeless Tobacco Use: Never   Passive Exposure: Not on file  Transportation Needs: No Transportation Needs   Lack of Transportation (Medical):  No   Lack of Transportation (Non-Medical): No    CCM Care Plan  Allergies  Allergen Reactions   Poison Oak Extract Rash    Medications Reviewed Today     Reviewed by Lillard Anes, MD (Physician) on 01/19/21 at 86  Med List Status: <None>   Medication Order Taking? Sig Documenting Provider Last Dose Status Informant  acetaminophen (TYLENOL) 650 MG CR tablet [id number] Yes Take 650 mg by mouth every 8 (eight) hours as needed for pain.  [provider] Taking Active Self  albuterol (VENTOLIN HFA) 108 (90 Base) MCG/ACT inhaler [id number] Yes Inhale 2 puffs into the lungs every 2 (two) hours as needed for wheezing or shortness of breath. Eileen Stanford, PA-C Taking Active   ascorbic acid (VITAMIN C) 500 MG tablet [id number] Yes Take 1 tablet (500 mg total) by mouth daily. Allie Bossier, MD Taking Active   aspirin EC 81 MG tablet [id number] Yes Take 81 mg by mouth daily. [provider] Taking Active Self  cholecalciferol (VITAMIN D) 25 MCG (1000 UNIT) tablet [id number] Yes Take 1,000 Units by mouth daily. [provider] Taking Active   ezetimibe (ZETIA) 10 MG tablet [id number] Yes TAKE 1 TABLET BY MOUTH  DAILY Lillard Anes, MD Taking Active   fluorometholone (FML) 0.1 %  ophthalmic suspension [id number] Yes SMARTSIG:In Eye(s) [provider] Taking Active   gabapentin (NEURONTIN) 300 MG capsule [id number] Yes Take 1 capsule (300 mg total) by mouth 3 (three) times daily. Lillard Anes, MD Taking Active   lisinopril (ZESTRIL) 5 MG tablet [id number] Yes Take 1 tablet (5 mg total) by mouth daily. Park Liter, MD Taking Active   meclizine (ANTIVERT) 25 MG tablet [id number] Yes Take 1 tablet (25 mg total) by mouth 3 (three) times daily as needed for dizziness. Lillard Anes, MD Taking Active Self  metoprolol succinate (TOPROL XL) 25 MG 24 hr tablet [id number] Yes Take 1 tablet (25 mg total) by mouth daily. Park Liter, MD Taking Active   nitroGLYCERIN (NITROSTAT) 0.4 MG SL tablet [id number]  Place 1 tablet (0.4 mg total) under the tongue every 5 (five) minutes as needed for chest pain. Darreld Mclean, PA-C  Expired 10/19/20 2359 Self  pantoprazole (PROTONIX) 40 MG tablet [id number] Yes Take 1 tablet (40 mg total) by mouth daily. Lillard Anes, MD Taking Active   rosuvastatin (CRESTOR) 20 MG tablet [id number] Yes TAKE 1 TABLET BY MOUTH AT  BEDTIME Lillard Anes, MD Taking Active   sertraline (ZOLOFT) 50 MG tablet [id number] Yes Take 1 tablet (50 mg total) by mouth daily. Lillard Anes, MD Taking Active   vitamin B-12 (CYANOCOBALAMIN) 500 MCG tablet [id number] Yes Take 500 mcg by mouth daily. [provider] Taking Active   zinc sulfate 220 (50 Zn) MG capsule [id number] Yes Take 1 capsule (220 mg total) by mouth daily. Allie Bossier, MD Taking Active             Patient Active Problem List   Diagnosis Date Noted   Trochanteric bursitis of left hip 01/19/2021   Sleep apnea    Severe aortic stenosis    Other chest pain    Neuropathy    Myocardial infarction [facility])    GERD (gastroesophageal reflux disease)    Dyspnea    Depression    Dementia ([place])    Arthritis    Acute respiratory failure  with hypoxia ([place]) 03/17/2020   Gastroenteritis due to COVID-19 virus 03/17/2020  Generalized weakness 03/13/2020   Thrombocytopenia ([place]) 03/13/2020   Pneumonia due to COVID-19 virus 03/13/2020   History of aortic stenosis 03/11/2020   Acute bursitis of left shoulder 01/04/2020   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   Angina pectoris ([place]) 10/07/2019   Atherosclerosis of aorta ([place]) 09/24/2019   BMI 27.0-27.9,adult 09/24/2019   Recurrent falls 06/25/2019   Herpes zoster 05/19/2019   Dyspnea on exertion 06/30/2018   Bilateral foot-drop 01/18/2017   Spinal stenosis of lumbar region with neurogenic claudication 01/18/2017   Dizziness 04/19/2016   CVA (cerebral vascular accident) ([place]) 04/03/2016   DNR (do not resuscitate) 04/03/2016   Coronary artery disease involving native coronary artery of native heart without angina pectoris 11/01/2015   Dyslipidemia 11/01/2015   Essential hypertension 11/01/2015   Status post coronary artery bypass graft 11/01/2015    Immunization History  Administered Date(s) Administered   Fluad Quad(high Dose 65+) 12/10/2018, 01/25/2020, 01/12/2021   Pneumococcal Conjugate-13 12/20/2017   Pneumococcal Polysaccharide-23 03/20/2012   Tdap 07/03/2019   Zoster Recombinat (Shingrix) 09/02/2020    Conditions to be addressed/monitored:  Hypertension, Hyperlipidemia, Coronary Artery Disease and GERD  Care Plan : CCM Pharmacy Care Plan  Updates made by Lane Hacker, [place] since 01/26/2021 12:00 AM     Problem: cad, htn and hld   Priority: High  Onset Date: 07/27/2020     Goal: Disease State Management   Start Date: 07/27/2020  Expected End Date: 07/27/2021  Recent Progress: On track  Priority: High  Note:    Current Barriers:  Unable to self administer medications as prescribed  Pharmacist Clinical Goal(s):  Patient will adhere to prescribed medication regimen as evidenced by fill history through collaboration with PharmD  and provider.   Interventions: 1:1 collaboration with Lillard Anes, MD regarding development and update of comprehensive plan of care as evidenced by provider attestation and co-signature Inter-disciplinary care team collaboration (see longitudinal plan of care) Comprehensive medication review performed; medication list updated in electronic medical record  Hypertension (BP goal <130/80) -Controlled -Current treatment: Lisinopril 5 mg daily  Metoprolol succinate 25 mg daily  -Medications previously tried:  none reported  -Current home readings:    -01/26/21: 139/88 and 75 -01/25/21: 128/86  -Current dietary habits: eats with his sister -Current exercise habits: walks around house with a cane -Denies hypotensive/hypertensive symptoms -Educated on BP goals and benefits of medications for prevention of heart attack, stroke and kidney damage; Daily salt intake goal < 2300 mg; Exercise goal of 150 minutes per week; -Counseled to monitor BP at home daily, document, and provide log at future appointments -Counseled on diet and exercise extensively Recommended to continue current medication  Hyperlipidemia/CAD: (LDL goal < 55) -Controlled -Current treatment: rosuvastatin 20 mg daily  Aspirin 81 mg daily  Clopidogrel 75 daily at bedtime  -Medications previously tried: none reported  -Current dietary patterns: eats with his sister some  -Current exercise habits: has foot drop and walks with a cane -Educated on Cholesterol goals;  Benefits of statin for ASCVD risk reduction; Importance of limiting foods high in cholesterol; -Counseled on diet and exercise extensively Recommended to continue current medication   Depression/Anxiety -Controlled -Current treatment: Sertraline 27m -Medications previously tried/failed:  -PHQ9:  [facility] 07/[facility] 08/05/2019  Decreased Interest 1 1  Down, Depressed, Hopeless 1 1  PHQ - 2 Score 2 2  Altered sleeping 0 -   Tired, decreased energy 0 -  Change in appetite 0 -  Feeling bad or failure  about yourself  0 -  Trouble concentrating 0 -  Moving slowly or fidgety/restless 0 -  Suicidal thoughts 0 -  PHQ-9 Score 2 -  -GAD7: No flowsheet data found. -Educated on Benefits of medication for symptom control November 2022: Will ask PCP to change to SNRI for neuropathy   Chronic Pain -Uncontrolled -Current treatment: Gabapentin -Medications previously tried: N/A  -Pain Scale Without Meds: 6/10  With Meds: 6/10  Aggravating Factors: Movement  Pain Type: Neuropathy  November 2022: Will ask for SNRI to help    Patient Goals/Self-Care Activities Patient will:  - take medications as prescribed focus on medication adherence by calling office or pharmacy when refill is needed check blood pressure daily, document, and provide at future appointments engage in dietary modifications by limiting salt and avoiding high fat/fried foods  Follow Up Plan: Telephone follow up appointment with care management team member scheduled for: March 2023  [place] Constable, Sherian Rein.D. [phone]        Care Gaps: Last annual wellness visit? 07/13/20    Medication Assistance: None required.  Patient affirms current coverage meets needs.  Patient's preferred pharmacy is:  [facility], [place] [address] Phone: [phone] Fax: [phone]  [facility] ([place], [place] [facility] [address] [place] [address] Phone: [phone] Fax: [place] [address] [address] Phone: [phone] Fax: [phone]  [facility] ([facility]) - [place], [place] [address] Phone: [phone] Fax: [phone]  Uses pill box? No - tremor makes  it harder to manage box Pt endorses fair compliance   We discussed: Current pharmacy is preferred with insurance plan and patient is satisfied with pharmacy services Patient decided to: Continue current medication management strategy  Care Plan and Follow Up Patient Decision:  Patient agrees to Care Plan and Follow-up.  Plan: Telephone follow up appointment with care management team member scheduled for:  March 2023

## 2021-02-22 DIAGNOSIS — I10 Essential (primary) hypertension: Secondary | ICD-10-CM

## 2021-02-22 DIAGNOSIS — F32A Depression, unspecified: Secondary | ICD-10-CM

## 2021-02-22 DIAGNOSIS — E785 Hyperlipidemia, unspecified: Secondary | ICD-10-CM | POA: Diagnosis not present

## 2021-03-02 ENCOUNTER — Other Ambulatory Visit: Payer: Self-pay

## 2021-03-02 DIAGNOSIS — G629 Polyneuropathy, unspecified: Secondary | ICD-10-CM

## 2021-03-02 MED ORDER — GABAPENTIN 300 MG PO CAPS: 300.0000 mg | ORAL_CAPSULE | Freq: Three times a day (TID) | ORAL | 2 refills | Status: DC

## 2021-03-30 ENCOUNTER — Other Ambulatory Visit: Payer: Self-pay | Admitting: Legal Medicine

## 2021-03-30 DIAGNOSIS — I63139 Cerebral infarction due to embolism of unspecified carotid artery: Secondary | ICD-10-CM

## 2021-03-31 ENCOUNTER — Telehealth: Payer: Self-pay

## 2021-03-31 NOTE — Chronic Care Management (AMB) (Signed)
Chronic Care Management Pharmacy Assistant   Name: Jack Hodges  MRN: [id number] DOB: 1930/09/20   Reason for Encounter: Disease State call for HTN   Recent office visits:  None  Recent consult visits:  None  Hospital visits:  None  Medications: Outpatient Encounter Medications as of 03/31/2021  Medication Sig   acetaminophen (TYLENOL) 650 MG CR tablet Take 650 mg by mouth every 8 (eight) hours as needed for pain.    albuterol (VENTOLIN HFA) 108 (90 Base) MCG/ACT inhaler Inhale 2 puffs into the lungs every 2 (two) hours as needed for wheezing or shortness of breath.   ascorbic acid (VITAMIN C) 500 MG tablet Take 1 tablet (500 mg total) by mouth daily.   aspirin EC 81 MG tablet Take 81 mg by mouth daily.   cholecalciferol (VITAMIN D) 25 MCG (1000 UNIT) tablet Take 1,000 Units by mouth daily.   ezetimibe (ZETIA) 10 MG tablet TAKE 1 TABLET BY MOUTH  DAILY   fluorometholone (FML) 0.1 % ophthalmic suspension SMARTSIG:In Eye(s)   gabapentin (NEURONTIN) 300 MG capsule Take 1 capsule (300 mg total) by mouth 3 (three) times daily.   lisinopril (ZESTRIL) 5 MG tablet Take 1 tablet (5 mg total) by mouth daily.   meclizine (ANTIVERT) 25 MG tablet Take 1 tablet (25 mg total) by mouth 3 (three) times daily as needed for dizziness.   metoprolol succinate (TOPROL XL) 25 MG 24 hr tablet Take 1 tablet (25 mg total) by mouth daily.   nitroGLYCERIN (NITROSTAT) 0.4 MG SL tablet Place 1 tablet (0.4 mg total) under the tongue every 5 (five) minutes as needed for chest pain.   pantoprazole (PROTONIX) 40 MG tablet Take 1 tablet (40 mg total) by mouth daily.   rosuvastatin (CRESTOR) 20 MG tablet TAKE 1 TABLET BY MOUTH AT  BEDTIME   sertraline (ZOLOFT) 50 MG tablet Take 1 tablet (50 mg total) by mouth daily.   vitamin B-12 (CYANOCOBALAMIN) 500 MCG tablet Take 500 mcg by mouth daily.   zinc sulfate 220 (50 Zn) MG capsule Take 1 capsule (220 mg total) by mouth daily.   No facility-administered encounter  medications on file as of 03/31/2021.     Recent Office Vitals: BP Readings from Last 3 Encounters:  01/19/21 (!) 200/80  10/19/20 (!) 146/63  10/11/20 (!) 160/80   Pulse Readings from Last 3 Encounters:  01/19/21 66  10/19/20 (!) 51  10/11/20 (!) 55    Wt Readings from Last 3 Encounters:  01/19/21 171 lb (77.6 kg)  10/19/20 163 lb 12.8 oz (74.3 kg)  10/11/20 166 lb (75.3 kg)     Kidney Function Lab Results  Component Value Date/Time   CREATININE 1.03 07/07/2020 03:17 PM   CREATININE 0.80 06/01/2020 10:12 AM   GFRNONAA 78 05/09/2020 08:26 AM   GFRNONAA >60 03/17/2020 12:44 AM   GFRAA 90 05/09/2020 08:26 AM    BMP Latest Ref Rng & Units 07/07/2020 06/01/2020 05/09/2020  Glucose 65 - 99 mg/dL 967(E) 85 87  BUN 8 - 27 mg/dL 16 14 10   Creatinine 0.76 - 1.27 mg/dL 9.38 1.01  BUN/Creat Ratio 10 - 24 16 18 12   Sodium 134 - 144 mmol/L 143 143 146(H)  Potassium 3.5 - 5.2 mmol/L 4.1 4.4 4.3  Chloride 96 - 106 mmol/L 107(H) 107(H) 111(H)  CO2 20 - 29 mmol/L 20 21 18(L)  Calcium 8.6 - 10.2 mg/dL 9.3 9.3 8.9     Current antihypertensive regimen:  Lisinopril 5 mg daily  Metoprolol  succinate 25 mg daily   Patient verbally confirms he is taking the above medications as directed. Yes  How often are you checking your Blood Pressure? twice daily  he checks his blood pressure in the morning and in the afternoon before taking his medication.  Current home BP readings: 03/29/21 149/79, 127/77, 03/30/21 112/72, 122/76, 03/31/20 127/85  Wrist or arm cuff:Arm  Salt intake:Limited  Any readings above 180/120? No  What recent interventions/DTPs have been made by any provider to improve Blood Pressure control since last CPP Visit: No changes have been made   Any recent hospitalizations or ED visits since last visit with CPP? No recent visits   What diet changes have been made to improve Blood Pressure Control?  Pt eats a lot soup   What exercise is being done to improve your Blood  Pressure Control?  Pt gets out and walks   Adherence Review: Is the patient currently on ACE/ARB medication? Yes Does the patient have >5 day gap between last estimated fill dates? CPP to review  Care Gaps: Last annual wellness visit?07/13/20  Star Rating Drugs:  Medication:  Last Fill: Day Supply Lisinopril   10/31/20  90ds I called [facility] in regards to gap in refills and they stated they had a RX for 10 mg and the 5 mg and they clarified what dose he is taking. I confirmed it was 5 mg and they stated they will go ahead and ship it out to pt.    Roxana Hires, CMA Clinical Pharmacist Assistant  [phone]

## 2021-04-04 NOTE — Telephone Encounter (Signed)
Called patient, BP was 160/80 yesterday. Will let Cardio know. Counseled on what BP is and its importance

## 2021-04-12 ENCOUNTER — Other Ambulatory Visit: Payer: Self-pay

## 2021-04-18 ENCOUNTER — Encounter: Payer: Self-pay | Admitting: Cardiology

## 2021-04-18 ENCOUNTER — Other Ambulatory Visit: Payer: Self-pay

## 2021-04-18 ENCOUNTER — Ambulatory Visit (INDEPENDENT_AMBULATORY_CARE_PROVIDER_SITE_OTHER): Payer: Medicare Other | Admitting: Cardiology

## 2021-04-18 VITALS — BP 136/78 | HR 78 | Ht 65.0 in | Wt 173.0 lb

## 2021-04-18 DIAGNOSIS — I2510 Atherosclerotic heart disease of native coronary artery without angina pectoris: Secondary | ICD-10-CM

## 2021-04-18 DIAGNOSIS — Z8679 Personal history of other diseases of the circulatory system: Secondary | ICD-10-CM

## 2021-04-18 DIAGNOSIS — Z952 Presence of prosthetic heart valve: Secondary | ICD-10-CM

## 2021-04-18 DIAGNOSIS — I10 Essential (primary) hypertension: Secondary | ICD-10-CM | POA: Diagnosis not present

## 2021-04-18 NOTE — Patient Instructions (Signed)
Medication Instructions:  Your physician recommends that you continue on your current medications as directed. Please refer to the Current Medication list given to you today.  *If you need a refill on your cardiac medications before your next appointment, please call your pharmacy*   Lab Work: None If you have labs (blood work) drawn today and your tests are completely normal, you will receive your results only by: MyChart Message (if you have MyChart) OR A paper copy in the mail If you have any lab test that is abnormal or we need to change your treatment, we will call you to review the results.   Testing/Procedures: None   Follow-Up: At [facility], you and your health needs are our priority.  As part of our continuing mission to provide you with exceptional heart care, we have created designated Provider Care Teams.  These Care Teams include your primary Cardiologist (physician) and Advanced Practice Providers (APPs -  Physician Assistants and Nurse Practitioners) who all work together to provide you with the care you need, when you need it.  We recommend signing up for the patient portal called "MyChart".  Sign up information is provided on this After Visit Summary.  MyChart is used to connect with patients for Virtual Visits (Telemedicine).  Patients are able to view lab/test results, encounter notes, upcoming appointments, etc.  Non-urgent messages can be sent to your provider as well.   To learn more about what you can do with MyChart, go to [email or web address].    Your next appointment:   6 month(s)  The format for your next appointment:   In Person  Provider:   Gypsy Balsam, MD    Other Instructions Ambulatory Referral for Neurology ordered

## 2021-04-18 NOTE — Progress Notes (Signed)
Cardiology Office Note:    Date:  04/18/2021   ID:  Jack Hodges, DOB 05-Apr-1930, MRN [id number]  PCP:  Abigail Miyamoto, MD  Cardiologist:  Gypsy Balsam, MD    Referring MD: Abigail Miyamoto,*   Chief Complaint  Patient presents with   Follow-up  Cardiac wise doing fine  History of Present Illness:    Jack Hodges is a 88 y.o. male   with past medical history significant for coronary artery disease, in 1996 he required coronary artery bypass graft.  Also in summer 2020 when he got cardiac catheterization done he underwent PTCA and stenting of left anterior descending artery in view of the fact that he got completely occluded LIMA going to LAD.  That cardiac catheterization was done in preparation for TAVI that eventually ended up having done on 10/27/2019.  He received Edwards sapiens 3 26 mm valve.  He also got history of bilateral foot drop, essential hypertension, dyslipidemia. Comes today to my office for follow-up.  Overall cardiac wise doing well.  He denies have any chest pain tightness squeezing pressure burning chest no palpitations dizziness swelling of lower extremities.  Past Medical History:  Diagnosis Date   Acute bursitis of left shoulder 01/04/2020   Acute respiratory failure with hypoxia (HCC) 03/17/2020   Angina pectoris (HCC) 10/07/2019   Arthritis    Atherosclerosis of aorta (HCC) 09/24/2019   Bilateral foot-drop 01/18/2017   BMI 27.0-27.9,adult 09/24/2019   Cellulitis of left hand 07/07/2019   Coronary artery disease involving native coronary artery of native heart without angina pectoris 11/01/2015   Overview:  Bypass surgery in 1996 Overview:  Overview:  Bypass surgery in 1996  Last Assessment & Plan:  Continue his current regimen.  Follow up as noted.   CVA (cerebral vascular accident) (HCC) 04/03/2016   Last Assessment & Plan:  The patient underwent extensive CVA workup.  MRI is negative.  He does have aortic stenosis by echocardiogram.  Upon review  of Care everywhere he is followed by Dr. Bing Matter, cardiologist for this.  Carotid ultrasound negative for hemodynamically significant stenosis.  Lipid panel within normal limits.  He has not had any further episodes of dizziness or nausea vomiting.    Dementia (HCC)    Depression    Dizziness 04/19/2016   DNR (do not resuscitate) 04/03/2016   Overview:  I had an extensive discussion with the patient on 04/03/2016 regarding his end of life wishes.  He and his daughter agree that do not resuscitate is in keeping with his beliefs.   Dyslipidemia 11/01/2015   Last Assessment & Plan:  Formatting of this note might be different from the original. Cholesterol 136   Triglycerides  83  HDL  50.1  LDL Calculated  69  VLDL Cholesterol Cal  17   Continue statin.   Dyspnea    with activity   Dyspnea on exertion 06/30/2018   Essential hypertension 11/01/2015   Last Assessment & Plan:  Resume home medications.  Follow-up as noted above.   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   Thrombocytopenia (HCC) 03/13/2020    Past Surgical History:  Procedure Laterality Date   CARDIAC CATHETERIZATION     CARDIAC SURGERY     CATARACT EXTRACTION, BILATERAL     CORONARY ARTERY BYPASS GRAFT     CORONARY STENT INTERVENTION N/A 10/07/2019    Procedure: CORONARY STENT INTERVENTION;  Surgeon: Tonny Bollman, MD;  Location: [facility] INVASIVE CV LAB;  Service: Cardiovascular;  Laterality: N/A;   EYE SURGERY     HEMORROIDECTOMY     RIGHT/LEFT HEART CATH AND CORONARY/GRAFT ANGIOGRAPHY N/A 10/07/2019   Procedure: RIGHT/LEFT HEART CATH AND CORONARY/GRAFT ANGIOGRAPHY;  Surgeon: Tonny Bollman, MD;  Location: [facility] INVASIVE CV LAB;  Service: Cardiovascular;  Laterality: N/A;   TEE WITHOUT CARDIOVERSION N/A 10/27/2019   Procedure: TRANSESOPHAGEAL ECHOCARDIOGRAM (TEE);  Surgeon: Kathleene Hazel, MD;  Location: [facility] INVASIVE CV LAB;  Service: Open Heart Surgery;  Laterality: N/A;   TRANSCATHETER AORTIC VALVE REPLACEMENT, TRANSFEMORAL N/A 10/27/2019   Procedure: TRANSCATHETER AORTIC VALVE REPLACEMENT, TRANSFEMORAL;  Surgeon: Kathleene Hazel, MD;  Location: [facility] INVASIVE CV LAB;  Service: Open Heart Surgery;  Laterality: N/A;    Current Medications: Current Meds  Medication Sig   acetaminophen (TYLENOL) 650 MG CR tablet Take 650 mg by mouth every 8 (eight) hours as needed for pain.    albuterol (VENTOLIN HFA) 108 (90 Base) MCG/ACT inhaler Inhale 2 puffs into the lungs every 2 (two) hours as needed for wheezing or shortness of breath.   ascorbic acid (VITAMIN C) 500 MG tablet Take 1 tablet (500 mg total) by mouth daily.   aspirin EC 81 MG tablet Take 81 mg by mouth daily.   cholecalciferol (VITAMIN D) 25 MCG (1000 UNIT) tablet Take 1,000 Units by mouth daily.   clopidogrel (PLAVIX) 75 MG tablet Take 75 mg by mouth daily.   ezetimibe (ZETIA) 10 MG tablet TAKE 1 TABLET BY MOUTH  DAILY (Patient taking differently: Take 10 mg by mouth daily.)   fluorometholone (FML) 0.1 % ophthalmic suspension Place 1 drop into both eyes daily.   gabapentin (NEURONTIN) 300 MG capsule Take 1 capsule (300 mg total) by mouth 3 (three) times daily.   lisinopril (ZESTRIL) 5 MG tablet Take 1 tablet (5 mg total) by mouth daily.   meclizine (ANTIVERT) 25 MG tablet Take 1  tablet (25 mg total) by mouth 3 (three) times daily as needed for dizziness.   metoprolol succinate (TOPROL XL) 25 MG 24 hr tablet Take 1 tablet (25 mg total) by mouth daily.   nitroGLYCERIN (NITROSTAT) 0.4 MG SL tablet Place 1 tablet (0.4 mg total) under the tongue every 5 (five) minutes as needed for chest pain.   pantoprazole (PROTONIX) 40 MG tablet Take 1 tablet (40 mg total) by mouth daily.   rosuvastatin (CRESTOR) 20 MG tablet TAKE 1 TABLET BY MOUTH AT  BEDTIME (Patient taking differently: Take 20 mg by mouth daily.)   sertraline (ZOLOFT) 50 MG tablet Take 1 tablet (50 mg total) by mouth daily.   vitamin B-12 (CYANOCOBALAMIN) 500 MCG tablet Take 500 mcg by mouth daily.   zinc sulfate 220 (50 Zn) MG capsule Take 1 capsule (220 mg total) by mouth daily.     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  He has one son that lives with him and one daughter that lives beside of him. He is close with his family.  His sister cooks meals for him regularly.  He has friends at the cafe he visits regularly.   Social Determinants of Health   Financial Resource Strain: Low Risk    Difficulty of Paying Living Expenses: Not hard at all  Food Insecurity: No Food Insecurity   Worried About Programme researcher, broadcasting/film/video in the Last  Year: Never true   Ran Out of Food in the Last Year: Never true  Transportation Needs: No Transportation Needs   Lack of Transportation (Medical): No   Lack of Transportation (Non-Medical): No  Physical Activity: Insufficiently Active   Days of Exercise per Week: 2 days   Minutes of Exercise per Session: 10 min  Stress: No Stress Concern Present   Feeling of Stress : Not at all  Social Connections: Moderately Isolated   Frequency of Communication with Friends and Family: More than three times a week   Frequency of Social Gatherings with Friends and Family: More than three times a week   Attends Religious Services: 1 to 4 times per year   Active Member of [employer] or Organizations: No   Attends Banker Meetings: Never   Marital Status: Widowed     Family History: The patient's family history includes Arthritis in his brother and father; Cancer in his brother and mother; Diabetes in his mother; Hypertension in his father; Kidney disease in his mother; Migraines in his father; Sleep disorder in his father and mother; Stroke in his mother. ROS:   Please see the history of present illness.    All 14 point review of systems negative except as described per history of present illness  EKGs/Labs/Other Studies Reviewed:      Recent Labs: 05/09/2020: ALT 12; Hemoglobin 11.7; Platelets 98 07/07/2020: BNP 122.2; BUN 16; Creatinine, Ser 1.03; Potassium 4.1; Sodium 143  Recent Lipid Panel    Component Value Date/Time   CHOL 139 07/07/2020 1517   TRIG 322 (H) 07/07/2020 1517   HDL 38 (L) 07/07/2020 1517   CHOLHDL 3.7 07/07/2020 1517   LDLCALC 52 07/07/2020 1517    Physical Exam:    VS:  BP 136/78 (BP Location: Left Arm, Patient Position: Sitting)    Pulse 78    Ht 5\' 5"  (1.651 m)    Wt 173 lb (78.5 kg)    SpO2 94%    BMI 28.79 kg/m     Wt Readings from Last 3 Encounters:  04/18/21 173 lb (78.5 kg)  01/19/21 171 lb (77.6 kg)  10/19/20 163 lb 12.8 oz (74.3  kg)     GEN:   Well nourished, well developed in no acute distress HEENT: Normal NECK: No JVD; No carotid bruits LYMPHATICS: No lymphadenopathy CARDIAC: RRR, no murmurs, no rubs, no gallops RESPIRATORY:  Clear to auscultation without rales, wheezing or rhonchi  ABDOMEN: Soft, non-tender, non-distended MUSCULOSKELETAL:  No edema; No deformity  SKIN: Warm and dry LOWER EXTREMITIES: no swelling NEUROLOGIC:  Alert and oriented x 3 PSYCHIATRIC:  Normal affect   ASSESSMENT:    1. Coronary artery disease involving native coronary artery of native heart without angina pectoris   2. S/P TAVR (transcatheter aortic valve replacement)   3. History of aortic stenosis   4. Essential hypertension    PLAN:    In order of problems listed above:  Coronary disease stable from that point review and appropriate medications continue present medications. Status post TAVI.  Last echocardiogram done in summer of last year show appropriate functioning of the valve.  We will continue present management. Essential hypertension blood pressure well controlled Dyslipidemia I did review his K PN which show me data from 07/07/2020 with LDL of 52 HDL 38.  We will continue present management. Essential tremor which is obviously very concerning.  Also he describes a problem with the balance for more than about neurological issue he will refer to neurology for consultation   Medication Adjustments/Labs and Tests Ordered: Current medicines are reviewed at length with the patient today.  Concerns regarding medicines are outlined above.  No orders of the defined types were placed in this encounter.  Medication changes: No orders of the defined types were placed in this encounter.   Signed, Georgeanna Lea, MD, [facility] 04/18/2021 3:14 PM    [facility]

## 2021-04-23 DIAGNOSIS — I2510 Atherosclerotic heart disease of native coronary artery without angina pectoris: Secondary | ICD-10-CM | POA: Diagnosis not present

## 2021-04-23 DIAGNOSIS — E785 Hyperlipidemia, unspecified: Secondary | ICD-10-CM | POA: Diagnosis not present

## 2021-04-23 DIAGNOSIS — Z87891 Personal history of nicotine dependence: Secondary | ICD-10-CM | POA: Diagnosis not present

## 2021-04-23 DIAGNOSIS — I10 Essential (primary) hypertension: Secondary | ICD-10-CM | POA: Diagnosis not present

## 2021-04-23 DIAGNOSIS — U071 COVID-19: Secondary | ICD-10-CM | POA: Diagnosis not present

## 2021-04-25 ENCOUNTER — Other Ambulatory Visit: Payer: Self-pay | Admitting: Legal Medicine

## 2021-04-25 DIAGNOSIS — F32A Depression, unspecified: Secondary | ICD-10-CM

## 2021-04-25 DIAGNOSIS — K2100 Gastro-esophageal reflux disease with esophagitis, without bleeding: Secondary | ICD-10-CM

## 2021-05-03 ENCOUNTER — Ambulatory Visit (INDEPENDENT_AMBULATORY_CARE_PROVIDER_SITE_OTHER): Payer: Medicare Other | Admitting: Legal Medicine

## 2021-05-03 ENCOUNTER — Encounter: Payer: Self-pay | Admitting: Legal Medicine

## 2021-05-03 ENCOUNTER — Other Ambulatory Visit: Payer: Self-pay

## 2021-05-03 VITALS — BP 160/80 | HR 73 | Temp 97.8°F | Resp 15 | Ht 65.0 in | Wt 171.0 lb

## 2021-05-03 DIAGNOSIS — U071 COVID-19: Secondary | ICD-10-CM | POA: Diagnosis not present

## 2021-05-03 DIAGNOSIS — J9601 Acute respiratory failure with hypoxia: Secondary | ICD-10-CM

## 2021-05-03 DIAGNOSIS — I700 Atherosclerosis of aorta: Secondary | ICD-10-CM

## 2021-05-03 DIAGNOSIS — R0602 Shortness of breath: Secondary | ICD-10-CM

## 2021-05-03 DIAGNOSIS — E785 Hyperlipidemia, unspecified: Secondary | ICD-10-CM | POA: Diagnosis not present

## 2021-05-03 DIAGNOSIS — J1282 Pneumonia due to coronavirus disease 2019: Secondary | ICD-10-CM

## 2021-05-03 DIAGNOSIS — D696 Thrombocytopenia, unspecified: Secondary | ICD-10-CM | POA: Diagnosis not present

## 2021-05-03 DIAGNOSIS — I209 Angina pectoris, unspecified: Secondary | ICD-10-CM | POA: Diagnosis not present

## 2021-05-03 DIAGNOSIS — I10 Essential (primary) hypertension: Secondary | ICD-10-CM | POA: Diagnosis not present

## 2021-05-03 LAB — PULMONARY FUNCTION TEST

## 2021-05-03 MED ORDER — BENZONATATE 100 MG PO CAPS: 100.0000 mg | ORAL_CAPSULE | Freq: Two times a day (BID) | ORAL | 3 refills | Status: DC | PRN

## 2021-05-03 MED ORDER — ALBUTEROL SULFATE HFA 108 (90 BASE) MCG/ACT IN AERS: 2.0000 | INHALATION_SPRAY | RESPIRATORY_TRACT | 6 refills | Status: DC | PRN

## 2021-05-03 NOTE — Progress Notes (Signed)
Subjective:  Patient ID: Jack Hodges, male    DOB: [birth year]  Age: 88 y.o. MRN: [id number]  Chief Complaint  Patient presents with   Shortness of Breath   Covid Positive    HPI Patient was diagnosed with Covid 19 on 04/23/2021. He was seeing at  [facility]. He had flu-like symptoms. He had cough and fever, nausea, vomiting, and diarrhea.  Patient was sent home without any treatment.  He is doing better. He still has cough, shortness of breath with ADL's, and dizzy when he cough a lot.  He is having persistent cough and shortness of breath pulmonary function tests were normal though.  He denied chest pain, fever, chills.  Current Outpatient Medications on File Prior to Visit  Medication Sig Dispense Refill   acetaminophen (TYLENOL) 650 MG CR tablet Take 650 mg by mouth every 8 (eight) hours as needed for pain.      ascorbic acid (VITAMIN C) 500 MG tablet Take 1 tablet (500 mg total) by mouth daily. 30 tablet 0   aspirin EC 81 MG tablet Take 81 mg by mouth daily.     cholecalciferol (VITAMIN D) 25 MCG (1000 UNIT) tablet Take 1,000 Units by mouth daily.     clopidogrel (PLAVIX) 75 MG tablet Take 75 mg by mouth daily.     ezetimibe (ZETIA) 10 MG tablet TAKE 1 TABLET BY MOUTH  DAILY (Patient taking differently: Take 10 mg by mouth daily.) 90 tablet 3   fluorometholone (FML) 0.1 % ophthalmic suspension Place 1 drop into both eyes daily.     gabapentin (NEURONTIN) 300 MG capsule Take 1 capsule (300 mg total) by mouth 3 (three) times daily. 270 capsule 2   lisinopril (ZESTRIL) 5 MG tablet Take 1 tablet (5 mg total) by mouth daily. 90 tablet 1   meclizine (ANTIVERT) 25 MG tablet Take 1 tablet (25 mg total) by mouth 3 (three) times daily as needed for dizziness. 30 tablet 0   metoprolol succinate (TOPROL XL) 25 MG 24 hr tablet Take 1 tablet (25 mg total) by mouth daily. 30 tablet 0   pantoprazole (PROTONIX) 40 MG tablet TAKE 1 TABLET BY MOUTH  DAILY 90 tablet 2    rosuvastatin (CRESTOR) 20 MG tablet TAKE 1 TABLET BY MOUTH AT  BEDTIME (Patient taking differently: Take 20 mg by mouth daily.) 90 tablet 3   sertraline (ZOLOFT) 50 MG tablet TAKE 1 TABLET BY MOUTH  DAILY 90 tablet 2   vitamin B-12 (CYANOCOBALAMIN) 500 MCG tablet Take 500 mcg by mouth daily.     zinc sulfate 220 (50 Zn) MG capsule Take 1 capsule (220 mg total) by mouth daily. 30 capsule 0   nitroGLYCERIN (NITROSTAT) 0.4 MG SL tablet Place 1 tablet (0.4 mg total) under the tongue every 5 (five) minutes as needed for chest pain. 25 tablet 2   No current facility-administered medications on file prior to visit.   Past Medical History:  Diagnosis Date   Acute bursitis of left shoulder 01/04/2020   Acute respiratory failure with hypoxia (HCC) 03/17/2020   Angina pectoris (HCC) 10/07/2019   Arthritis    Atherosclerosis of aorta (HCC) 09/24/2019   Bilateral foot-drop 01/18/2017   BMI 27.0-27.9,adult 09/24/2019   Cellulitis of left hand 07/07/2019   Coronary artery disease involving native coronary artery of native heart without angina pectoris 11/01/2015   Overview:  Bypass surgery in 1996 Overview:  Overview:  Bypass surgery in 1996  Last Assessment & Plan:  Continue  his current regimen.  Follow up as noted.   CVA (cerebral vascular accident) (HCC) 04/03/2016   Last Assessment & Plan:  The patient underwent extensive CVA workup.  MRI is negative.  He does have aortic stenosis by echocardiogram.  Upon review of Care everywhere he is followed by Dr. Bing Matter, cardiologist for this.  Carotid ultrasound negative for hemodynamically significant stenosis.  Lipid panel within normal limits.  He has not had any further episodes of dizziness or nausea vomiting.    Dementia (HCC)    Depression    Dizziness 04/19/2016   DNR (do not resuscitate) 04/03/2016   Overview:  I had an extensive discussion with the patient on 04/03/2016 regarding his end of life wishes.  He and his daughter agree that do not resuscitate is in  keeping with his beliefs.   Dyslipidemia 11/01/2015   Last Assessment & Plan:  Formatting of this note might be different from the original. Cholesterol 136   Triglycerides  83  HDL  50.1  LDL Calculated  69  VLDL Cholesterol Cal  17   Continue statin.   Dyspnea    with activity   Dyspnea on exertion 06/30/2018   Essential hypertension 11/01/2015   Last Assessment & Plan:  Resume home medications.  Follow-up as noted above.   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   Thrombocytopenia (HCC) 03/13/2020   Past Surgical History:  Procedure Laterality Date   CARDIAC CATHETERIZATION     CARDIAC SURGERY     CATARACT EXTRACTION, BILATERAL     CORONARY ARTERY BYPASS GRAFT     CORONARY STENT INTERVENTION N/A 10/07/2019   Procedure: CORONARY STENT INTERVENTION;  Surgeon: Tonny Bollman, MD;  Location[facility] INVAS[facility] CV LAB;  Service: Cardiovascular;  Laterality: N/A;   EYE SURGERY     HEMORROIDECTOMY     RIGHT/LEFT HEART CATH AND CORONARY/GRAFT ANGIOGRAPHY N/A 10/07/2019   Procedure: RIGHT/LEFT HEART CATH AND CORONARY/GRAFT ANGIOGRAPHY;  Surgeon: Tonny Bollman, MD;  Location: [facility] INVASIVE CV LAB;  Service: Cardiovascular;  Laterality: N/A;   TEE WITHOUT CARDIOVERSION N/A 10/27/2019   Procedure: TRANSESOPHAGEAL ECHOCARDIOGRAM  (TEE);  Surgeon: Kathleene Hazel, MD;  Location: [facility] INVASIVE CV LAB;  Service: Open Heart Surgery;  Laterality: N/A;   TRANSCATHETER AORTIC VALVE REPLACEMENT, TRANSFEMORAL N/A 10/27/2019   Procedure: TRANSCATHETER AORTIC VALVE REPLACEMENT, TRANSFEMORAL;  Surgeon: Kathleene Hazel, MD;  Location: [facility] INVASIVE CV LAB;  Service: Open Heart Surgery;  Laterality: N/A;    Family History  Problem Relation Age of Onset   Cancer Mother    Diabetes Mother    Kidney disease Mother    Stroke Mother    Sleep disorder Mother    Arthritis Father    Migraines Father    Hypertension Father    Sleep disorder Father  Arthritis Brother    Cancer Brother    Social History   Socioeconomic History   Marital status: Widowed    Spouse name: Dorann Lodge   Number of children: 2   Years of education: Not on file   Highest education level: Not on file  Occupational History   Occupation: Retired    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  He has one son that lives with him and one daughter that lives beside of him. He is close with his family.  His sister cooks meals for him regularly.  He has friends at the cafe he visits regularly.   Social Determinants of Health   Financial Resource Strain: Low Risk    Difficulty of Paying Living Expenses: Not hard at all  Food Insecurity: No Food Insecurity   Worried About Programme researcher, broadcasting/film/video in the Last Year: Never true   Ran Out of Food in the Last Year: Never true  Transportation Needs: No Transportation Needs   Lack of Transportation  (Medical): No   Lack of Transportation (Non-Medical): No  Physical Activity: Insufficiently Active   Days of Exercise per Week: 2 days   Minutes of Exercise per Session: 10 min  Stress: No Stress Concern Present   Feeling of Stress : Not at all  Social Connections: Moderately Isolated   Frequency of Communication with Friends and Family: More than three times a week   Frequency of Social Gatherings with Friends and Family: More than three times a week   Attends Religious Services: 1 to 4 times per year   Active Member of [employer] or Organizations: No   Attends Banker Meetings: Never   Marital Status: Widowed    Review of Systems  Constitutional:  Negative for chills, fatigue, fever and unexpected weight change.  HENT:  Negative for congestion, ear pain, sinus pain and sore throat.   Respiratory:  Positive for cough and shortness of breath.   Cardiovascular:  Negative for chest pain and palpitations.  Gastrointestinal:  Negative for abdominal pain, blood in stool, constipation, diarrhea, nausea and vomiting.  Endocrine: Negative for polydipsia.  Genitourinary:  Negative for dysuria.  Musculoskeletal:  Negative for back pain.  Skin:  Negative for rash.  Neurological:  Negative for headaches.    Objective:  BP (!) 160/80    Pulse 73    Temp 97.8 F (36.6 C)    Resp 15    Ht 5\' 5"  (1.651 m)    Wt 171 lb (77.6 kg)    SpO2 98%    BMI 28.46 kg/m   BP/Weight 05/03/2021 04/18/2021 01/19/2021  Systolic BP 160 136 200  Diastolic BP 80 78 80  Wt. (Lbs) 171 173 171  BMI 28.46 28.79 28.46    Physical Exam Vitals reviewed.  Constitutional:      Appearance: Normal appearance. He is obese.  HENT:     Head: Normocephalic.     Right Ear: Tympanic membrane, ear canal and external ear normal.  Left Ear: Tympanic membrane, ear canal and external ear normal.     Nose: Nose normal.     Mouth/Throat:     Mouth: Mucous membranes are moist.     Pharynx: Oropharynx is clear. No  posterior oropharyngeal erythema.  Eyes:     Extraocular Movements: Extraocular movements intact.     Conjunctiva/sclera: Conjunctivae normal.     Pupils: Pupils are equal, round, and reactive to light.  Neck:     Vascular: No carotid bruit.  Cardiovascular:     Rate and Rhythm: Normal rate and regular rhythm.     Pulses: Normal pulses.     Heart sounds: Normal heart sounds. No murmur heard. Pulmonary:     Effort: Pulmonary effort is normal. No respiratory distress.     Breath sounds: Normal breath sounds. No wheezing.  Abdominal:     General: Bowel sounds are normal. There is no distension.     Palpations: Abdomen is soft. There is no mass.  Musculoskeletal:        General: Normal range of motion.     Cervical back: Normal range of motion. No tenderness.     Right lower leg: No edema.     Left lower leg: No edema.  Skin:    General: Skin is warm.     Capillary Refill: Capillary refill takes 2 to 3 seconds.  Neurological:     General: No focal deficit present.     Mental Status: He is alert and oriented to person, place, and time. Mental status is at baseline.     Gait: Gait normal.     Deep Tendon Reflexes: Reflexes normal.     Comments: Bilateral foot drop. AFO bilateral  Psychiatric:        Mood and Affect: Mood normal.        Behavior: Behavior normal.        Thought Content: Thought content normal.        Lab Results  Component Value Date   WBC 6.5 05/09/2020   HGB 11.7 (L) 05/09/2020   HCT 36.8 (L) 05/09/2020   PLT 98 (LL) 05/09/2020   GLUCOSE 175 (H) 07/07/2020   CHOL 139 07/07/2020   TRIG 322 (H) 07/07/2020   HDL 38 (L) 07/07/2020   LDLCALC 52 07/07/2020   ALT 12 05/09/2020   AST 23 05/09/2020   NA 143 07/07/2020   K 4.1 07/07/2020   CL 107 (H) 07/07/2020   CREATININE 1.03 07/07/2020   BUN 16 07/07/2020   CO2 20 07/07/2020   INR 1.1 10/23/2019   HGBA1C 4.8 10/23/2019      Assessment & Plan:   Problem List Items Addressed This Visit        Cardiovascular and Mediastinum   Essential hypertension - Primary   Relevant Orders   CBC with Differential/Platelet   Comprehensive metabolic panel An individual hypertension care plan was established and reinforced today.  The patient's status was assessed using clinical findings on exam and labs or diagnostic tests. The patient's success at meeting treatment goals on disease specific evidence-based guidelines and found to be fair controlled. SELF MANAGEMENT: The patient and I together assessed ways to personally work towards obtaining the recommended goals. RECOMMENDATIONS: avoid decongestants found in common cold remedies, decrease consumption of alcohol, perform routine monitoring of BP with home BP cuff, exercise, reduction of dietary salt, take medicines as prescribed, try not to miss doses and quit smoking.  Regular exercise and maintaining a healthy weight is needed.  Stress reduction may help. A CLINICAL SUMMARY including written plan identify barriers to care unique to individual due to social or financial issues.  We attempt to mutually creat solutions for individual and family understanding.    Atherosclerosis of aorta [facility]) Patient has atherosclerosis of the aorta and is presently covered with Crestor 20 mg/day for his cholesterol.    Angina pectoris [facility]) Patient has had no further angina pectoris and is otherwise active.     Respiratory   Acute respiratory failure with hypoxia (HCC) Patient has a history of acute respiratory failure but is now doing well without any oxygen.   Pneumonia due to COVID-19 virus   Relevant Medications   albuterol (VENTOLIN HFA) 108 (90 Base) MCG/ACT inhaler   benzonatate (TESSALON) 100 MG capsule Patient is recovering from his pneumonia from COVID he still having some persistent cough probably due to inflammation of the pulmonary tissue from COVID.  We will treat with Tessalon Perles and albuterol.     Other   Dyslipidemia   Relevant Orders    Lipid panel AN INDIVIDUAL CARE PLAN for hyperlipidemia/ cholesterol was established and reinforced today.  The patient's status was assessed using clinical findings on exam, lab and other diagnostic tests. The patient's disease status was assessed based on evidence-based guidelines and found to be fair controlled. MEDICATIONS were reviewed. SELF MANAGEMENT GOALS have been discussed and patient's success at attaining the goal of low cholesterol was assessed. RECOMMENDATION given include regular exercise 3 days a week and low cholesterol/low fat diet. CLINICAL SUMMARY including written plan to identify barriers unique to the patient due to social or economic  reasons was discussed.    Dyspnea   Relevant Orders   Pulmonary Function Test Normal pulmonary function test with FVC 85% predicted, FEV1 being 96% predicted, and FEV1/F VC being 112%   Other Visit Diagnoses     Thrombocytopenia, unspecified (HCC)   (Chronic)   Patient has chronic idiopathic thrombocytopenia has been worked up and has no malignancy potential     .  Meds ordered this encounter  Medications   albuterol (VENTOLIN HFA) 108 (90 Base) MCG/ACT inhaler    Sig: Inhale 2 puffs into the lungs every 2 (two) hours as needed for wheezing or shortness of breath.    Dispense:  6.7 g    Refill:  6   benzonatate (TESSALON) 100 MG capsule    Sig: Take 1 capsule (100 mg total) by mouth 2 (two) times daily as needed for cough.    Dispense:  60 capsule    Refill:  3    Orders Placed This Encounter  Procedures   CBC with Differential/Platelet   Comprehensive metabolic panel   Lipid panel   Pulmonary Function Test   30 minute visit with review of records  Follow-up: Return in about 3 months (around 07/31/2021) for fasting.  An After Visit Summary was printed and given to the patient.  Brent Bulla, MD [facility] [phone]

## 2021-05-04 LAB — CBC WITH DIFFERENTIAL/PLATELET
Basophils Absolute: 0.1 10*3/uL (ref 0.0–0.2)
Basos: 1 %
EOS (ABSOLUTE): 0.3 10*3/uL (ref 0.0–0.4)
Eos: 4 %
Hematocrit: 40.7 % (ref 37.5–51.0)
Hemoglobin: 13.4 g/dL (ref 13.0–17.7)
Immature Grans (Abs): 0 10*3/uL (ref 0.0–0.1)
Immature Granulocytes: 1 %
Lymphocytes Absolute: 1.8 10*3/uL (ref 0.7–3.1)
Lymphs: 25 %
MCH: 29.6 pg (ref 26.6–33.0)
MCHC: 32.9 g/dL (ref 31.5–35.7)
MCV: 90 fL (ref 79–97)
Monocytes Absolute: 0.5 10*3/uL (ref 0.1–0.9)
Monocytes: 6 %
Neutrophils Absolute: 4.5 10*3/uL (ref 1.4–7.0)
Neutrophils: 63 %
Platelets: 195 10*3/uL (ref 150–450)
RBC: 4.53 x10E6/uL (ref 4.14–5.80)
RDW: 14.3 % (ref 11.6–15.4)
WBC: 7.1 10*3/uL (ref 3.4–10.8)

## 2021-05-04 LAB — COMPREHENSIVE METABOLIC PANEL
ALT: 18 IU/L (ref 0–44)
AST: 24 IU/L (ref 0–40)
Albumin/Globulin Ratio: 2.5 — ABNORMAL HIGH (ref 1.2–2.2)
Albumin: 4.2 g/dL (ref 3.6–4.6)
Alkaline Phosphatase: 79 IU/L (ref 44–121)
BUN/Creatinine Ratio: 13 (ref 10–24)
BUN: 11 mg/dL (ref 8–27)
Bilirubin Total: 0.5 mg/dL (ref 0.0–1.2)
CO2: 22 mmol/L (ref 20–29)
Calcium: 9.4 mg/dL (ref 8.6–10.2)
Chloride: 105 mmol/L (ref 96–106)
Creatinine, Ser: 0.84 mg/dL (ref 0.76–1.27)
Globulin, Total: 1.7 g/dL (ref 1.5–4.5)
Glucose: 123 mg/dL — ABNORMAL HIGH (ref 70–99)
Potassium: 4 mmol/L (ref 3.5–5.2)
Sodium: 141 mmol/L (ref 134–144)
Total Protein: 5.9 g/dL — ABNORMAL LOW (ref 6.0–8.5)
eGFR: 84 mL/min/{1.73_m2} (ref >59)

## 2021-05-04 LAB — LIPID PANEL
Chol/HDL Ratio: 4.9 ratio (ref 0.0–5.0)
Cholesterol, Total: 176 mg/dL (ref 100–199)
HDL: 36 mg/dL — ABNORMAL LOW (ref >39)
LDL Chol Calc (NIH): 103 mg/dL — ABNORMAL HIGH (ref 0–99)
Triglycerides: 211 mg/dL — ABNORMAL HIGH (ref 0–149)
VLDL Cholesterol Cal: 37 mg/dL (ref 5–40)

## 2021-05-04 LAB — CARDIOVASCULAR RISK ASSESSMENT

## 2021-05-04 NOTE — Progress Notes (Signed)
Glucose 123, kidney tests normal, liver tests normal, triglycerides high 211, LDL cholesterol 103, CBC normal lp

## 2021-05-16 ENCOUNTER — Other Ambulatory Visit: Payer: Self-pay

## 2021-05-16 ENCOUNTER — Ambulatory Visit (INDEPENDENT_AMBULATORY_CARE_PROVIDER_SITE_OTHER): Payer: Medicare Other | Admitting: Legal Medicine

## 2021-05-16 ENCOUNTER — Encounter: Payer: Self-pay | Admitting: Legal Medicine

## 2021-05-16 VITALS — BP 124/82 | HR 63 | Temp 98.0°F | Ht 65.0 in | Wt 168.6 lb

## 2021-05-16 DIAGNOSIS — S51001A Unspecified open wound of right elbow, initial encounter: Secondary | ICD-10-CM | POA: Diagnosis not present

## 2021-05-16 MED ORDER — CEPHALEXIN 500 MG PO CAPS: 500.0000 mg | ORAL_CAPSULE | Freq: Two times a day (BID) | ORAL | 0 refills | Status: DC

## 2021-05-16 NOTE — Progress Notes (Signed)
Subjective:  Patient ID: Jack Hodges, male    DOB: [birth year]  Age: 88 y.o. MRN: [id number]    Fall Pertinent negatives include no abdominal pain, fever, headaches, nausea, numbness or vomiting.    Patient presents today with a right forearm wound with redness around area. Patient states two weeks ago he fell in the shower, hits his head and arm (two days in a row). Tetanus UTD.  Current Outpatient Medications on File Prior to Visit  Medication Sig Dispense Refill   acetaminophen (TYLENOL) 650 MG CR tablet Take 650 mg by mouth every 8 (eight) hours as needed for pain.      albuterol (VENTOLIN HFA) 108 (90 Base) MCG/ACT inhaler Inhale 2 puffs into the lungs every 2 (two) hours as needed for wheezing or shortness of breath. 6.7 g 6   ascorbic acid (VITAMIN C) 500 MG tablet Take 1 tablet (500 mg total) by mouth daily. 30 tablet 0   aspirin EC 81 MG tablet Take 81 mg by mouth daily.     benzonatate (TESSALON) 100 MG capsule Take 1 capsule (100 mg total) by mouth 2 (two) times daily as needed for cough. 60 capsule 3   cholecalciferol (VITAMIN D) 25 MCG (1000 UNIT) tablet Take 1,000 Units by mouth daily.     clopidogrel (PLAVIX) 75 MG tablet Take 75 mg by mouth daily.     ezetimibe (ZETIA) 10 MG tablet TAKE 1 TABLET BY MOUTH  DAILY (Patient taking differently: Take 10 mg by mouth daily.) 90 tablet 3   fluorometholone (FML) 0.1 % ophthalmic suspension Place 1 drop into both eyes daily.     gabapentin (NEURONTIN) 300 MG capsule Take 1 capsule (300 mg total) by mouth 3 (three) times daily. 270 capsule 2   lisinopril (ZESTRIL) 5 MG tablet Take 1 tablet (5 mg total) by mouth daily. 90 tablet 1   meclizine (ANTIVERT) 25 MG tablet Take 1 tablet (25 mg total) by mouth 3 (three) times daily as needed for dizziness. 30 tablet 0   metoprolol succinate (TOPROL XL) 25 MG 24 hr tablet Take 1 tablet (25 mg total) by mouth daily. 30 tablet 0   pantoprazole (PROTONIX) 40 MG tablet TAKE 1 TABLET BY MOUTH   DAILY 90 tablet 2   rosuvastatin (CRESTOR) 20 MG tablet TAKE 1 TABLET BY MOUTH AT  BEDTIME (Patient taking differently: Take 20 mg by mouth daily.) 90 tablet 3   sertraline (ZOLOFT) 50 MG tablet TAKE 1 TABLET BY MOUTH  DAILY 90 tablet 2   vitamin B-12 (CYANOCOBALAMIN) 500 MCG tablet Take 500 mcg by mouth daily.     zinc sulfate 220 (50 Zn) MG capsule Take 1 capsule (220 mg total) by mouth daily. 30 capsule 0   nitroGLYCERIN (NITROSTAT) 0.4 MG SL tablet Place 1 tablet (0.4 mg total) under the tongue every 5 (five) minutes as needed for chest pain. 25 tablet 2   No current facility-administered medications on file prior to visit.   Past Medical History:  Diagnosis Date   Acute bursitis of left shoulder 01/04/2020   Acute respiratory failure with hypoxia (HCC) 03/17/2020   Angina pectoris (HCC) 10/07/2019   Arthritis    Atherosclerosis of aorta (HCC) 09/24/2019   Bilateral foot-drop 01/18/2017   BMI 27.0-27.9,adult 09/24/2019   Cellulitis of left hand 07/07/2019   Coronary artery disease involving native coronary artery of native heart without angina pectoris 11/01/2015   Overview:  Bypass surgery in 1996 Overview:  Overview:  Bypass surgery in 1996  Last Assessment & Plan:  Continue his current regimen.  Follow up as noted.   CVA (cerebral vascular accident) (HCC) 04/03/2016   Last Assessment & Plan:  The patient underwent extensive CVA workup.  MRI is negative.  He does have aortic stenosis by echocardiogram.  Upon review of Care everywhere he is followed by Dr. Bing Matter, cardiologist for this.  Carotid ultrasound negative for hemodynamically significant stenosis.  Lipid panel within normal limits.  He has not had any further episodes of dizziness or nausea vomiting.    Dementia (HCC)    Depression    Dizziness 04/19/2016   DNR (do not resuscitate) 04/03/2016   Overview:  I had an extensive discussion with the patient on 04/03/2016 regarding his end of life wishes.  He and his daughter agree that do  not resuscitate is in keeping with his beliefs.   Dyslipidemia 11/01/2015   Last Assessment & Plan:  Formatting of this note might be different from the original. Cholesterol 136   Triglycerides  83  HDL  50.1  LDL Calculated  69  VLDL Cholesterol Cal  17   Continue statin.   Dyspnea    with activity   Dyspnea on exertion 06/30/2018   Essential hypertension 11/01/2015   Last Assessment & Plan:  Resume home medications.  Follow-up as noted above.   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   Thrombocytopenia (HCC) 03/13/2020   Past Surgical History:  Procedure Laterality Date   CARDIAC CATHETERIZATION     CARDIAC SURGERY     CATARACT EXTRACTION, BILATERAL     CORONARY ARTERY BYPASS GRAFT     CORONARY STENT INTERVENTION N/A 10/07/2019   Procedure: CORONARY STENT INTERVENTION;  Surgeon: Tonny Bollman, MD;  Location: [facility] INVASIVE CV LAB;  Service: Cardiovascular;  Laterality: N/A;   EYE SURGERY     HEMORROIDECTOMY     RIGHT/LEFT HEART CATH AND CORONARY/GRAFT ANGIOGRAPHY N/A 10/07/2019   Procedure: RIGHT/LEFT HEART CATH AND CORONARY/GRAFT ANGIOGRAPHY;  Surgeon: Tonny Bollman, MD;  Location: [facility] INVASIVE CV LAB;  Service: Cardiovascular;  Laterality: N/A;   TEE WITHOUT CARDIOVERSION N/A 10/27/2019   Procedure:  TRANSESOPHAGEAL ECHOCARDIOGRAM (TEE);  Surgeon: Kathleene Hazel, MD;  Location: [facility] INVASIVE CV LAB;  Service: Open Heart Surgery;  Laterality: N/A;   TRANSCATHETER AORTIC VALVE REPLACEMENT, TRANSFEMORAL N/A 10/27/2019   Procedure: TRANSCATHETER AORTIC VALVE REPLACEMENT, TRANSFEMORAL;  Surgeon: Kathleene Hazel, MD;  Location: [facility] INVASIVE CV LAB;  Service: Open Heart Surgery;  Laterality: N/A;    Family History  Problem Relation Age of Onset   Cancer Mother    Diabetes Mother    Kidney disease Mother    Stroke Mother    Sleep disorder Mother    Arthritis Father    Migraines Father    Hypertension Father  Sleep disorder Father    Arthritis Brother    Cancer Brother    Social History   Socioeconomic History   Marital status: Widowed    Spouse name: Dorann Lodge   Number of children: 2   Years of education: Not on file   Highest education level: Not on file  Occupational History   Occupation: Retired    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  He has one son that lives with him and one daughter that lives beside of him. He is close with his family.  His sister cooks meals for him regularly.  He has friends at the cafe he visits regularly.   Social Determinants of Health   Financial Resource Strain: Low Risk    Difficulty of Paying Living Expenses: Not hard at all  Food Insecurity: No Food Insecurity   Worried About Programme researcher, broadcasting/film/video in the Last Year: Never true   Ran Out of Food in the Last Year: Never true  Transportation Needs: No Transportation Needs    Lack of Transportation (Medical): No   Lack of Transportation (Non-Medical): No  Physical Activity: Insufficiently Active   Days of Exercise per Week: 2 days   Minutes of Exercise per Session: 10 min  Stress: No Stress Concern Present   Feeling of Stress : Not at all  Social Connections: Moderately Isolated   Frequency of Communication with Friends and Family: More than three times a week   Frequency of Social Gatherings with Friends and Family: More than three times a week   Attends Religious Services: 1 to 4 times per year   Active Member of [employer] or Organizations: No   Attends Banker Meetings: Never   Marital Status: Widowed    Review of Systems  Constitutional:  Negative for chills, fatigue, fever and unexpected weight change.  HENT:  Negative for congestion, rhinorrhea, sinus pressure, sneezing and sore throat.   Eyes:  Negative for discharge and visual disturbance.  Respiratory:  Negative for cough, shortness of breath and wheezing.   Cardiovascular:  Negative for chest pain and palpitations.  Gastrointestinal:  Negative for abdominal pain, diarrhea, nausea and vomiting.  Endocrine: Negative for polydipsia, polyphagia and polyuria.  Genitourinary:  Negative for decreased urine volume, difficulty urinating, dysuria, frequency, penile swelling and urgency.  Musculoskeletal:  Negative for back pain, gait problem, joint swelling, neck pain and neck stiffness.  Neurological:  Negative for dizziness, seizures, weakness, numbness and headaches.  Psychiatric/Behavioral:  Negative for confusion, hallucinations, sleep disturbance and suicidal ideas. The patient is not nervous/anxious and is not hyperactive.     Objective:  BP 124/82    Pulse 63    Temp 98 F (36.7 C)    Ht  (1.651 m)    Wt 168 lb 9.6 oz (76.5 kg)    SpO2 98%    BMI 28.06 kg/m   BP/Weight 05/16/2021 05/03/2021 04/18/2021  Systolic BP 124 160 136  Diastolic BP 82 80 78  Wt. (  Lbs) 168.6 171 173  BMI  28.06 28.46 28.79    Physical Exam Vitals reviewed.  Constitutional:      General: He is not in acute distress.    Appearance: Normal appearance.  Cardiovascular:     Rate and Rhythm: Normal rate and regular rhythm.     Pulses: Normal pulses.     Heart sounds: Normal heart sounds. No murmur heard.   No gallop.  Pulmonary:     Effort: Pulmonary effort is normal. No respiratory distress.     Breath sounds: Normal breath sounds. No wheezing.  Musculoskeletal:        General: Normal range of motion.     Cervical back: Normal range of motion and neck supple.  Skin:    Comments: 2 1cm superficial lacerations right elbow with surrounding erythema, no purulent discharge  Neurological:     Mental Status: He is alert.    Diabetic Foot Exam - Simple   No data filed      Lab Results  Component Value Date   WBC 7.1 05/03/2021   HGB 13.4 05/03/2021   HCT 40.7 05/03/2021   PLT 195 05/03/2021   GLUCOSE 123 (H) 05/03/2021   CHOL 176 05/03/2021   TRIG 211 (H) 05/03/2021   HDL 36 (L) 05/03/2021   LDLCALC 103 (H) 05/03/2021   ALT 18 05/03/2021   AST 24 05/03/2021   NA 141 05/03/2021   K 4.0 05/03/2021   CL 105 05/03/2021   CREATININE 0.84 05/03/2021   BUN 11 05/03/2021   CO2 22 05/03/2021   INR 1.1 10/23/2019   HGBA1C 4.8 10/23/2019      Assessment & Plan:   Problem List Items Addressed This Visit       Other   Open wound of elbow, right, initial encounter - Primary   Relevant Medications   cephALEXin (KEFLEX) 500 MG capsule Patient has a 1cm superficial open wound right elbow with surrounding erythema, no purulence.  Keep clean, treat with keflex  .  Meds ordered this encounter  Medications   cephALEXin (KEFLEX) 500 MG capsule    Sig: Take 1 capsule (500 mg total) by mouth 2 (two) times daily.    Dispense:  20 capsule    Refill:  0    No orders of the defined types were placed in this encounter.    Follow-up: Return if symptoms worsen or fail to  improve.  An After Visit Summary was printed and given to the patient.  Brent Bulla, MD [facility] [phone]

## 2021-06-05 ENCOUNTER — Telehealth: Payer: Self-pay

## 2021-06-05 NOTE — Progress Notes (Signed)
Pt son confirmed pt appt on 3/15 with CPP. ? ?Elray Mcgregor, CMA ?Clinical Pharmacist Assistant  ?[phone]  ?

## 2021-06-07 ENCOUNTER — Ambulatory Visit (INDEPENDENT_AMBULATORY_CARE_PROVIDER_SITE_OTHER): Payer: Medicare Other

## 2021-06-07 ENCOUNTER — Other Ambulatory Visit: Payer: Self-pay

## 2021-06-07 DIAGNOSIS — I10 Essential (primary) hypertension: Secondary | ICD-10-CM

## 2021-06-07 DIAGNOSIS — I700 Atherosclerosis of aorta: Secondary | ICD-10-CM

## 2021-06-07 DIAGNOSIS — E785 Hyperlipidemia, unspecified: Secondary | ICD-10-CM

## 2021-06-07 NOTE — Patient Instructions (Signed)
Visit Information ? ? Goals Addressed   ?None ?  ? ?Patient Care Plan: [place]  ?  ? ?Problem Identified: cad, htn and hld   ?Priority: High  ?Onset Date: 07/27/2020  ?  ? ?Goal: Disease State Management   ?Start Date: 07/27/2020  ?Expected End Date: 07/27/2021  ?Recent Progress: On track  ?Priority: High  ?Note:   ? ?Current Barriers:  ?Unable to self administer medications as prescribed ? ?Pharmacist Clinical Goal(s):  ?Patient will adhere to prescribed medication regimen as evidenced by fill history through collaboration with PharmD and provider.  ? ?Interventions: ?1:1 collaboration with Jack Anes, MD regarding development and update of comprehensive plan of care as evidenced by provider attestation and co-signature ?Inter-disciplinary care team collaboration (see longitudinal plan of care) ?Comprehensive medication review performed; medication list updated in electronic medical record ? ?Hypertension (BP goal <130/80) ?-Controlled ?-Current treatment: ?Lisinopril 5 mg daily Appropriate, Query effective, Safe, Accessible ?Metoprolol succinate 25 mg daily Appropriate, Query effective, Safe, Accessible ?-Medications previously tried:  none reported  ?-Current home readings:   ? November 2022: ?  -01/26/21: 139/88 and 75 ?-01/25/21: 128/86  ?March 2023: ? -06/07/21: 130/86  ?-06/06/21: 141/82, 171/87 ?-06/05/21: 191/110 ?-06/04/21: 178/94 ?-Current dietary habits: eats with his sister ?-Current exercise habits: walks around house with a cane ?-Denies hypotensive/hypertensive symptoms ?-Educated on BP goals and benefits of medications for prevention of heart attack, stroke and kidney damage; ?Daily salt intake goal < 2300 mg; ?Exercise goal of 150 minutes per week; ?-Counseled to monitor BP at home daily, document, and provide log at future appointments ?-Counseled on diet and exercise extensively ?March 2023: BP elevated, will let Cardio know ASAP. Told patient to call me everyday his BP is above  goal ? ?Hyperlipidemia/CAD: (LDL goal < 55) ?-Controlled ?-Current treatment: ?rosuvastatin 20 mg daily Appropriate, Effective, Safe, Accessible ?Ezetimibe 10mg  Appropriate, Effective, Safe, Accessible ?Aspirin 81 mg daily Appropriate, Effective, Safe, Accessible ?Clopidogrel 75 daily at bedtime Appropriate, Effective, Safe, Accessible ?-Medications previously tried: none reported  ?-Current dietary patterns: eats with his sister some  ?-Current exercise habits: has foot drop and walks with a cane ?-Educated on Cholesterol goals;  ?Benefits of statin for ASCVD risk reduction; ?Importance of limiting foods high in cholesterol; ?-Counseled on diet and exercise extensively ?Recommended to continue current medication ? ? ?Depression/Anxiety ?-Controlled ?-Current treatment: ?Sertraline 50mg  Appropriate, Effective, Safe, Accessible ?-Medications previously tried/failed:  ?-PHQ9:  ?Depression screen Jack Hodges 2/9 05/16/2021 07/13/2020 08/05/2019  ?Decreased Interest 0 1 1  ?Down, Depressed, Hopeless 0 1 1  ?PHQ - 2 Score 0 2 2  ?Altered sleeping 1 0 -  ?Tired, decreased energy 0 0 -  ?Change in appetite 0 0 -  ?Feeling bad or failure about yourself  1 0 -  ?Trouble concentrating 0 0 -  ?Moving slowly or fidgety/restless 0 0 -  ?Suicidal thoughts 0 0 -  ?PHQ-9 Score 2 2 -  ?-GAD7: No flowsheet data found. ?-Educated on Benefits of medication for symptom control ?November 2022: Will ask PCP to change to SNRI for neuropathy ?March 2023: Will ask PCP to change to SNRI for neuropathy, didn't hear back from last recommendation ? ? ?Chronic Pain ?-Uncontrolled ?-Current treatment: ?Gabapentin Appropriate, Effective, Safe, Accessible ?-Medications previously tried: N/A  ?-Pain Scale ?Without Meds: 6/10  ?With Meds: 6/10  ?Aggravating Factors: Movement  ?Pain Type: Neuropathy  ?November 2022: Will ask for SNRI to help ?March 2023: Will ask PCP to change to SNRI for neuropathy, didn't hear back  from last recommendation ? ?Tremor (Goal:  Decrease Symptoms) ?-Uncontrolled ?-Current treatment  ?None ?-Medications previously tried: None  ?March 2023: Will coordinate with team to get Neuro referral sent. Patient has fallen multiple times and with his DAPT this could be harmful ? ? ? ?Patient Goals/Self-Care Activities ?Patient will:  ?- take medications as prescribed ?focus on medication adherence by calling office or pharmacy when refill is needed ?check blood pressure daily, document, and provide at future appointments ?engage in dietary modifications by limiting salt and avoiding high fat/fried foods ? ?Follow Up Plan: Telephone follow up appointment with care management team member scheduled for: June 2023 ? ?Jack Hodges, Pharm.D. - [phone] ? ?  ? ? ?Jack Hodges was given information about Chronic Care Management services today including:  ?CCM service includes personalized support from designated clinical staff supervised by his physician, including individualized plan of care and coordination with other care providers ?24/7 contact phone numbers for assistance for urgent and routine care needs. ?Standard insurance, coinsurance, copays and deductibles apply for chronic care management only during months in which we provide at least 20 minutes of these services. Most insurances cover these services at 100%, however patients may be responsible for any copay, coinsurance and/or deductible if applicable. This service may help you avoid the need for more expensive face-to-face services. ?Only one practitioner may furnish and bill the service in a calendar month. ?The patient may stop CCM services at any time (effective at the end of the month) by phone call to the office staff. ? ?Patient agreed to services and verbal consent obtained.  ? ?The patient verbalized understanding of instructions, educational materials, and care plan provided today and declined offer to receive copy of patient instructions, educational materials, and care plan.  ?The  pharmacy team will reach out to the patient again over the next 30 days.  ? ?Jack Hodges, [place] ? ?

## 2021-06-07 NOTE — Progress Notes (Signed)
? ?Chronic Care Management ?Pharmacy Note ? ?06/07/2021 ?Name:  DRE GAMINO MRN:  [id number] DOB:  1930/06/22  ? ?Plan Updates:  ?Patient's neuropathy not controlled, will ask PCP about changing SSRI to SNRI for mental health AND neuropathy control ?Recommend referral to Neuro, patient would like this as well as Cardio ?BP elevated, sent msg to Cardio to evaluate ? ? ?Subjective: ?Jack Hodges is an 88 y.o. year old male who is a primary patient of Henrene Pastor, Zeb Comfort, MD.  The CCM team was consulted for assistance with disease management and care coordination needs.   ? ?Engaged with patient by telephone for follow up visit in response to provider referral for pharmacy case management and/or care coordination services.  ? ?Consent to Services:  ?The patient was given the following information about Chronic Care Management services today, agreed to services, and gave verbal consent: 1. CCM service includes personalized support from designated clinical staff supervised by the primary care provider, including individualized plan of care and coordination with other care providers 2. 24/7 contact phone numbers for assistance for urgent and routine care needs. 3. Service will only be billed when office clinical staff spend 20 minutes or more in a month to coordinate care. 4. Only one practitioner may furnish and bill the service in a calendar month. 5.The patient may stop CCM services at any time (effective at the end of the month) by phone call to the office staff. 6. The patient will be responsible for cost sharing (co-pay) of up to 20% of the service fee (after annual deductible is met). Patient agreed to services and consent obtained. ? ?Patient Care Team: ?Lillard Anes, MD as PCP - General (Family Medicine) ?Park Liter, MD as PCP - Cardiology (Cardiology) ?Lane Hacker, [facility] as Pharmacist (Pharmacist) ? ?Recent office visits:  ?None ?  ?Recent consult visits:  ?None ?  ?Hospital visits:   ?None ?  ? ? ?Objective: ? ?Lab Results  ?Component Value Date  ? CREATININE 0.84 05/03/2021  ? BUN 11 05/03/2021  ? GFRNONAA 78 05/09/2020  ? GFRAA 90 05/09/2020  ? NA 141 05/03/2021  ? K 4.0 05/03/2021  ? CALCIUM 9.4 05/03/2021  ? CO2 22 05/03/2021  ? GLUCOSE 123 (H) 05/03/2021  ? ? ?Lab Results  ?Component Value Date/Time  ? HGBA1C 4.8 10/23/2019 09:55 AM  ?  ?Last diabetic Eye exam: No results found for: HMDIABEYEEXA  ?Last diabetic Foot exam: No results found for: HMDIABFOOTEX  ? ?Lab Results  ?Component Value Date  ? CHOL 176 05/03/2021  ? HDL 36 (L) 05/03/2021  ? LDLCALC 103 (H) 05/03/2021  ? TRIG 211 (H) 05/03/2021  ? CHOLHDL 4.9 05/03/2021  ? ? ?Hepatic Function Latest Ref Rng & Units 05/03/2021 05/09/2020 03/17/2020  ?Total Protein 6.0 - 8.5 g/dL 5.9(L) 6.2 5.5(L)  ?Albumin 3.6 - 4.6 g/dL 4.2 4.1 2.9(L)  ?AST 0 - 40 IU/L 24 23 39  ?ALT 0 - 44 IU/L 18 12 44  ?Alk Phosphatase 44 - 121 IU/L 79 74 51  ?Total Bilirubin 0.0 - 1.2 mg/dL 0.5 0.6 0.8  ?Bilirubin, Direct 0.0 - 0.2 mg/dL - - -  ? ? ?No results found for: TSH, FREET4 ? ?CBC Latest Ref Rng & Units 05/03/2021 05/09/2020 03/15/2020  ?WBC 3.4 - 10.8 x10E3/uL 7.1 6.5 10.1  ?Hemoglobin 13.0 - 17.7 g/dL 13.4 11.7(L) 11.1(L)  ?Hematocrit 37.5 - 51.0 % 40.7 36.8(L) 31.4(L)  ?Platelets 150 - 450 x10E3/uL 195 98(LL) 130(L)  ? ? ?No  results found for: VD25OH ? ?Clinical ASCVD: Yes  ?The ASCVD Risk score (Arnett DK, et al., 2019) failed to calculate for the following reasons: ?  The 2019 ASCVD risk score is only valid for ages 16 to 60 ?  The patient has a prior MI or stroke diagnosis   ? ?Depression screen [facility] 2/9 05/16/2021 07/13/2020 08/05/2019  ?Decreased Interest 0 1 1  ?Down, Depressed, Hopeless 0 1 1  ?PHQ - 2 Score 0 2 2  ?Altered sleeping 1 0 -  ?Tired, decreased energy 0 0 -  ?Change in appetite 0 0 -  ?Feeling bad or failure about yourself  1 0 -  ?Trouble concentrating 0 0 -  ?Moving slowly or fidgety/restless 0 0 -  ?Suicidal thoughts 0 0 -  ?PHQ-9 Score 2 2  -  ?  ? ? ?Social History  ? ?Tobacco Use  ?Smoking Status Former  ? Types: Cigarettes, Cigars  ? Quit date: 05/02/1962  ? Years since quitting: 59.1  ?Smokeless Tobacco Never  ? ?BP Readings from Last 3 Encounters:  ?05/16/21 124/82  ?05/03/21 (!) 160/80  ?04/18/21 136/78  ? ?Pulse Readings from Last 3 Encounters:  ?05/16/21 63  ?05/03/21 73  ?04/18/21 78  ? ?Wt Readings from Last 3 Encounters:  ?05/16/21 168 lb 9.6 oz (76.5 kg)  ?05/03/21 171 lb (77.6 kg)  ?04/18/21 173 lb (78.5 kg)  ? ?BMI Readings from Last 3 Encounters:  ?05/16/21 28.06 kg/m?  ?05/03/21 28.46 kg/m?  ?04/18/21 28.79 kg/m?  ? ? ?Assessment/Interventions: Review of patient past medical history, allergies, medications, health status, including review of consultants reports, laboratory and other test data, was performed as part of comprehensive evaluation and provision of chronic care management services.  ? ?SDOH:  (Social Determinants of Health) assessments and interventions performed: Yes ? ? ?SDOH Screenings  ? ?Alcohol Screen: Low Risk   ? Last Alcohol Screening Score (AUDIT): 0  ?Depression (PHQ2-9): Low Risk   ? PHQ-2 Score: 2  ?Financial Resource Strain: Low Risk   ? Difficulty of Paying Living Expenses: Not hard at all  ?Food Insecurity: No Food Insecurity  ? Worried About Charity fundraiser in the Last Year: Never true  ? Ran Out of Food in the Last Year: Never true  ?Housing: Low Risk   ? Last Housing Risk Score: 0  ?Physical Activity: Insufficiently Active  ? Days of Exercise per Week: 2 days  ? Minutes of Exercise per Session: 10 min  ?Social Connections: Moderately Isolated  ? Frequency of Communication with Friends and Family: More than three times a week  ? Frequency of Social Gatherings with Friends and Family: More than three times a week  ? Attends Religious Services: 1 to 4 times per year  ? Active Member of Clubs or Organizations: No  ? Attends Archivist Meetings: Never  ? Marital Status: Widowed  ?Stress: No Stress  Concern Present  ? Feeling of Stress : Not at all  ?Tobacco Use: Medium Risk  ? Smoking Tobacco Use: Former  ? Smokeless Tobacco Use: Never  ? Passive Exposure: Not on file  ?Transportation Needs: No Transportation Needs  ? Lack of Transportation (Medical): No  ? Lack of Transportation (Non-Medical): No  ? ? ?CCM Care Plan ? ?Allergies  ?Allergen Reactions  ? Poison Oak Extract Rash  ? ? ?Medications Reviewed Today   ? ? Reviewed by Lane Hacker, [facility] (Pharmacist) on 06/07/21 at 1035  Med List Status: <None>  ? ?Medication Order  Taking? Sig Documenting Provider Last Dose Status Informant  ?acetaminophen (TYLENOL) 650 MG CR tablet [id number] No Take 650 mg by mouth every 8 (eight) hours as needed for pain.  [provider] Taking Active Self  ?albuterol (VENTOLIN HFA) 108 (90 Base) MCG/ACT inhaler [id number] No Inhale 2 puffs into the lungs every 2 (two) hours as needed for wheezing or shortness of breath. Lillard Anes, MD Taking Active   ?ascorbic acid (VITAMIN C) 500 MG tablet [id number] No Take 1 tablet (500 mg total) by mouth daily. Allie Bossier, MD Taking Active   ?aspirin EC 81 MG tablet [id number] No Take 81 mg by mouth daily. [provider] Taking Active Self  ?benzonatate (TESSALON) 100 MG capsule [id number] No Take 1 capsule (100 mg total) by mouth 2 (two) times daily as needed for cough. Lillard Anes, MD Taking Active   ?cephALEXin (KEFLEX) 500 MG capsule [id number]  Take 1 capsule (500 mg total) by mouth 2 (two) times daily. Lillard Anes, MD  Active   ?cholecalciferol (VITAMIN D) 25 MCG (1000 UNIT) tablet [id number] No Take 1,000 Units by mouth daily. [provider] Taking Active   ?clopidogrel (PLAVIX) 75 MG tablet [id number] No Take 75 mg by mouth daily. [provider] Taking Active   ?ezetimibe (ZETIA) 10 MG tablet [id number] No TAKE 1 TABLET BY MOUTH  DAILY  ?Patient taking differently: Take 10 mg by mouth daily.  ? Lillard Anes, MD Taking Active   ?fluorometholone (FML) 0.1 % ophthalmic suspension [id number] No Place 1 drop into both eyes daily. [provider] Taking Active   ?gabapentin (NEURONTIN) 300 MG capsule

## 2021-06-08 ENCOUNTER — Other Ambulatory Visit: Payer: Self-pay | Admitting: Legal Medicine

## 2021-06-09 NOTE — Telephone Encounter (Signed)
Refill sent to pharmacy.   

## 2021-06-13 ENCOUNTER — Telehealth: Payer: Self-pay

## 2021-06-13 NOTE — Chronic Care Management (AMB) (Signed)
? ? ?  Chronic Care Management ?Pharmacy Assistant  ? ?Name: Jack Hodges  MRN: [id number] DOB: [birth year] ? ?Reason for Encounter: Blood pressure readings ?  ?06/13/2021- Patient called with blood pressure readings. Patient stated yesterday her blood pressure was at 185/102 in the morning and today his blood pressure is at 125/81. Patient reports feeling ok still having the shakes. Informed patient to continue to monitor blood pressure, Jack Hodges, CPP is still waiting to hear back from Cardiologist and PCP but to continue to monitor as planned and call with any more elevated readings. Patient voices understanding.  ? ?Medications: ?Outpatient Encounter Medications as of 06/13/2021  ?Medication Sig  ? acetaminophen (TYLENOL) 650 MG CR tablet Take 650 mg by mouth every 8 (eight) hours as needed for pain.   ? albuterol (VENTOLIN HFA) 108 (90 Base) MCG/ACT inhaler Inhale 2 puffs into the lungs every 2 (two) hours as needed for wheezing or shortness of breath.  ? ascorbic acid (VITAMIN C) 500 MG tablet Take 1 tablet (500 mg total) by mouth daily.  ? aspirin EC 81 MG tablet Take 81 mg by mouth daily.  ? benzonatate (TESSALON) 100 MG capsule Take 1 capsule (100 mg total) by mouth 2 (two) times daily as needed for cough.  ? cephALEXin (KEFLEX) 500 MG capsule Take 1 capsule (500 mg total) by mouth 2 (two) times daily.  ? cholecalciferol (VITAMIN D) 25 MCG (1000 UNIT) tablet Take 1,000 Units by mouth daily.  ? clopidogrel (PLAVIX) 75 MG tablet Take 75 mg by mouth daily.  ? ezetimibe (ZETIA) 10 MG tablet TAKE 1 TABLET BY MOUTH  DAILY  ? fluorometholone (FML) 0.1 % ophthalmic suspension Place 1 drop into both eyes daily.  ? gabapentin (NEURONTIN) 300 MG capsule Take 1 capsule (300 mg total) by mouth 3 (three) times daily.  ? lisinopril (ZESTRIL) 5 MG tablet Take 1 tablet (5 mg total) by mouth daily.  ? meclizine (ANTIVERT) 25 MG tablet Take 1 tablet (25 mg total) by mouth 3 (three) times daily as needed for dizziness.  ?  metoprolol succinate (TOPROL XL) 25 MG 24 hr tablet Take 1 tablet (25 mg total) by mouth daily.  ? nitroGLYCERIN (NITROSTAT) 0.4 MG SL tablet Place 1 tablet (0.4 mg total) under the tongue every 5 (five) minutes as needed for chest pain.  ? pantoprazole (PROTONIX) 40 MG tablet TAKE 1 TABLET BY MOUTH  DAILY  ? rosuvastatin (CRESTOR) 20 MG tablet TAKE 1 TABLET BY MOUTH AT  BEDTIME (Patient taking differently: Take 20 mg by mouth daily.)  ? sertraline (ZOLOFT) 50 MG tablet TAKE 1 TABLET BY MOUTH  DAILY  ? vitamin B-12 (CYANOCOBALAMIN) 500 MCG tablet Take 500 mcg by mouth daily.  ? zinc sulfate 220 (50 Zn) MG capsule Take 1 capsule (220 mg total) by mouth daily.  ? ?No facility-administered encounter medications on file as of 06/13/2021.  ? ?Jack Hodges, CMA ?Clinical Pharmacist Assistant ?[phone] ? ?

## 2021-06-13 NOTE — Progress Notes (Signed)
Jack Hodges will let PCP know BP readings ASAP since they are elevated and high. Told her via TEAMS ?

## 2021-06-14 ENCOUNTER — Telehealth: Payer: Self-pay | Admitting: Cardiology

## 2021-06-14 MED ORDER — LISINOPRIL 5 MG PO TABS: 5.0000 mg | ORAL_TABLET | Freq: Every day | ORAL | 0 refills | Status: DC

## 2021-06-14 NOTE — Telephone Encounter (Signed)
?*  STAT* If patient is at the pharmacy, call can be transferred to refill team. ? ? ?1. Which medications need to be refilled? (please list name of each medication and dose if known)  ?lisinopril (ZESTRIL) 5 MG tablet ? ?2. Which pharmacy/location (including street and city if local pharmacy) is medication to be sent to? [place] [place] [address] ? ?3. Do they need a 30 day or 90 day supply? ? ?Patient's son states they are still awaiting delivery from [facility], but patient only has enough medication for 1 more day. He is requesting a 2 week supply to be sent to patient's local pharmacy, listed above. Please assist. ? ?

## 2021-06-14 NOTE — Telephone Encounter (Signed)
Rx refill sent to pharmacy. 

## 2021-06-20 ENCOUNTER — Telehealth: Payer: Self-pay

## 2021-06-20 NOTE — Telephone Encounter (Signed)
Patient coming in Friday March 31st at 1000a. ?

## 2021-06-20 NOTE — Telephone Encounter (Signed)
-----   Message from Eugenie Norrie, CMA sent at 06/20/2021  2:25 PM EDT ----- ?Regarding: FW: Blood pressure readings ? ?----- Message ----- ?From: Abigail Miyamoto, MD ?Sent: 06/14/2021   9:13 AM EDT ?To: Eugenie Norrie, CMA ?Subject: FW: Blood pressure readings                   ? ?See Salah Mandile for nurse BP check ?lp ?----- Message ----- ?From: Billee Cashing E ?Sent: 06/13/2021  12:50 PM EDT ?To: Abigail Miyamoto, MD, # ?Subject: Blood pressure readings                       ? ?Hi Dr Marina Goodell ? ? Patient called with blood pressure readings. Patient stated his blood pressure yesterday was 185/102 in the morning and today his blood pressure is at 125/81. Patient reports feeling ok still having the shakes. Please advise. ? ?Billee Cashing, CMA ?Clinical Pharmacist Assistant ?[phone] ? ? ? ? ?

## 2021-06-23 ENCOUNTER — Other Ambulatory Visit: Payer: Self-pay

## 2021-06-23 ENCOUNTER — Ambulatory Visit: Payer: Medicare Other

## 2021-06-23 DIAGNOSIS — I10 Essential (primary) hypertension: Secondary | ICD-10-CM

## 2021-06-23 DIAGNOSIS — E785 Hyperlipidemia, unspecified: Secondary | ICD-10-CM | POA: Diagnosis not present

## 2021-06-23 DIAGNOSIS — S51001A Unspecified open wound of right elbow, initial encounter: Secondary | ICD-10-CM

## 2021-06-23 MED ORDER — CEPHALEXIN 500 MG PO CAPS: 500.0000 mg | ORAL_CAPSULE | Freq: Two times a day (BID) | ORAL | 0 refills | Status: DC

## 2021-06-23 NOTE — Patient Instructions (Signed)
Patient came in for a blood pressure check. It was 132/86, spoke with his provider, Dr. Marina Goodell and he states he was good to go that the blood pressure was good.  ?

## 2021-06-23 NOTE — Telephone Encounter (Signed)
Refill sent to pharmacy.   

## 2021-07-04 ENCOUNTER — Ambulatory Visit: Payer: Medicare Other

## 2021-07-04 ENCOUNTER — Other Ambulatory Visit: Payer: Self-pay

## 2021-07-04 DIAGNOSIS — S51001A Unspecified open wound of right elbow, initial encounter: Secondary | ICD-10-CM

## 2021-07-04 DIAGNOSIS — G629 Polyneuropathy, unspecified: Secondary | ICD-10-CM

## 2021-07-04 MED ORDER — CEPHALEXIN 500 MG PO CAPS: 500.0000 mg | ORAL_CAPSULE | Freq: Two times a day (BID) | ORAL | 0 refills | Status: DC

## 2021-07-04 MED ORDER — GABAPENTIN 300 MG PO CAPS: 300.0000 mg | ORAL_CAPSULE | Freq: Three times a day (TID) | ORAL | 2 refills | Status: DC

## 2021-07-04 NOTE — Progress Notes (Signed)
Jack Hodges comes in today for recheck of his bp.  He has been checking his bp at home and his bp has ranged from 105/71 to 180/105.  His bp in the office today was 140/100 and then rechecked with his cuff at 160/91.   ?

## 2021-07-04 NOTE — Patient Instructions (Signed)
Dr. Henrene Pastor reviewed bp readings.  No changes made in medications at this time.  Jack Hodges is going to change the batteries in his device and continue to check his bp daily.   ?

## 2021-07-27 ENCOUNTER — Other Ambulatory Visit: Payer: Self-pay | Admitting: Legal Medicine

## 2021-07-27 ENCOUNTER — Other Ambulatory Visit: Payer: Medicare Other

## 2021-07-27 DIAGNOSIS — E782 Mixed hyperlipidemia: Secondary | ICD-10-CM | POA: Diagnosis not present

## 2021-07-27 DIAGNOSIS — E785 Hyperlipidemia, unspecified: Secondary | ICD-10-CM

## 2021-07-27 DIAGNOSIS — I10 Essential (primary) hypertension: Secondary | ICD-10-CM | POA: Diagnosis not present

## 2021-07-27 MED ORDER — ROSUVASTATIN CALCIUM 20 MG PO TABS: 20.0000 mg | ORAL_TABLET | Freq: Every day | ORAL | 3 refills | Status: DC

## 2021-07-28 ENCOUNTER — Other Ambulatory Visit: Payer: Medicare Other

## 2021-07-28 LAB — COMPREHENSIVE METABOLIC PANEL
ALT: 13 IU/L (ref 0–44)
AST: 21 IU/L (ref 0–40)
Albumin/Globulin Ratio: 1.9 (ref 1.2–2.2)
Albumin: 4.2 g/dL (ref 3.6–4.6)
Alkaline Phosphatase: 84 IU/L (ref 44–121)
BUN/Creatinine Ratio: 13 (ref 10–24)
BUN: 11 mg/dL (ref 8–27)
Bilirubin Total: 0.7 mg/dL (ref 0.0–1.2)
CO2: 22 mmol/L (ref 20–29)
Calcium: 9.1 mg/dL (ref 8.6–10.2)
Chloride: 109 mmol/L — ABNORMAL HIGH (ref 96–106)
Creatinine, Ser: 0.86 mg/dL (ref 0.76–1.27)
Globulin, Total: 2.2 g/dL (ref 1.5–4.5)
Glucose: 83 mg/dL (ref 70–99)
Potassium: 4 mmol/L (ref 3.5–5.2)
Sodium: 146 mmol/L — ABNORMAL HIGH (ref 134–144)
Total Protein: 6.4 g/dL (ref 6.0–8.5)
eGFR: 83 mL/min/{1.73_m2} (ref >59)

## 2021-07-28 LAB — CBC WITH DIFFERENTIAL/PLATELET
Basophils Absolute: 0.1 10*3/uL (ref 0.0–0.2)
Basos: 1 %
EOS (ABSOLUTE): 0.4 10*3/uL (ref 0.0–0.4)
Eos: 5 %
Hematocrit: 44.4 % (ref 37.5–51.0)
Hemoglobin: 14.6 g/dL (ref 13.0–17.7)
Immature Grans (Abs): 0 10*3/uL (ref 0.0–0.1)
Immature Granulocytes: 0 %
Lymphocytes Absolute: 2 10*3/uL (ref 0.7–3.1)
Lymphs: 25 %
MCH: 28.6 pg (ref 26.6–33.0)
MCHC: 32.9 g/dL (ref 31.5–35.7)
MCV: 87 fL (ref 79–97)
Monocytes Absolute: 0.4 10*3/uL (ref 0.1–0.9)
Monocytes: 5 %
Neutrophils Absolute: 5 10*3/uL (ref 1.4–7.0)
Neutrophils: 64 %
Platelets: 157 10*3/uL (ref 150–450)
RBC: 5.11 x10E6/uL (ref 4.14–5.80)
RDW: 14.3 % (ref 11.6–15.4)
WBC: 7.9 10*3/uL (ref 3.4–10.8)

## 2021-07-28 LAB — LIPID PANEL
Chol/HDL Ratio: 3.5 ratio (ref 0.0–5.0)
Cholesterol, Total: 166 mg/dL (ref 100–199)
HDL: 47 mg/dL (ref >39)
LDL Chol Calc (NIH): 99 mg/dL (ref 0–99)
Triglycerides: 109 mg/dL (ref 0–149)
VLDL Cholesterol Cal: 20 mg/dL (ref 5–40)

## 2021-07-28 LAB — CARDIOVASCULAR RISK ASSESSMENT

## 2021-07-30 NOTE — Progress Notes (Signed)
Kidney tests normal, liver tests normal, CBC normal, triglycerides high, LDLcholesterol high, take cholesterol medicine ?lp

## 2021-08-01 ENCOUNTER — Ambulatory Visit (INDEPENDENT_AMBULATORY_CARE_PROVIDER_SITE_OTHER): Payer: Medicare Other | Admitting: Legal Medicine

## 2021-08-01 ENCOUNTER — Encounter: Payer: Self-pay | Admitting: Legal Medicine

## 2021-08-01 VITALS — BP 106/72 | HR 68 | Temp 97.6°F | Resp 18 | Ht 65.0 in | Wt 166.0 lb

## 2021-08-01 DIAGNOSIS — Z952 Presence of prosthetic heart valve: Secondary | ICD-10-CM

## 2021-08-01 DIAGNOSIS — E785 Hyperlipidemia, unspecified: Secondary | ICD-10-CM | POA: Diagnosis not present

## 2021-08-01 DIAGNOSIS — K219 Gastro-esophageal reflux disease without esophagitis: Secondary | ICD-10-CM | POA: Diagnosis not present

## 2021-08-01 DIAGNOSIS — Z6827 Body mass index (BMI) 27.0-27.9, adult: Secondary | ICD-10-CM | POA: Diagnosis not present

## 2021-08-01 DIAGNOSIS — I700 Atherosclerosis of aorta: Secondary | ICD-10-CM

## 2021-08-01 DIAGNOSIS — I209 Angina pectoris, unspecified: Secondary | ICD-10-CM | POA: Diagnosis not present

## 2021-08-01 DIAGNOSIS — I10 Essential (primary) hypertension: Secondary | ICD-10-CM

## 2021-08-01 DIAGNOSIS — G4733 Obstructive sleep apnea (adult) (pediatric): Secondary | ICD-10-CM | POA: Diagnosis not present

## 2021-08-01 NOTE — Progress Notes (Signed)
? ?Subjective:  ?Patient ID: Pedrohenrique Starlyn Skeans, male    DOB: 1931/03/09  Age: 88 y.o. MRN: [id number] ? ?Chief Complaint  ?Patient presents with  ? Hypertension  ? Hyperlipidemia  ? Peripheral Neuropathy  ? ? ?HPI: chronic visit ? ?Patient presents for follow up of hypertension.  Patient tolerating metoprolol , lisinopril well with side effects.  Patient was diagnosed with hypertension 2010 so has been treated for hypertension for 12 years.Patient is working on maintaining diet and exercise regimen and follows up as directed. Complication include cad. Blood pressure range in April was systolic 104-192 and diastolic 73-105. ? ?Patient presents with hyperlipidemia.  Compliance with treatment has been good; patient takes medicines as directed, maintains low cholesterol diet, follows up as directed, and maintains exercise regimen.  Patient is using crestor without problems.  ? ?Chronic peripheral neuropathy with foot drop, has numbness in hands.  Chronic idiopathic neuropathy on gabapentin ? ?From Dr. Bing Matter ?Badr W Boeke is a 88 y.o. male   with past medical history significant for coronary artery disease, in 1996 he required coronary artery bypass graft.  Also in summer 2020 when he got cardiac catheterization done he underwent PTCA and stenting of left anterior descending artery in view of the fact that he got completely occluded LIMA going to LAD.  That cardiac catheterization was done in preparation for TAVI that eventually ended up having done on 10/27/2019.  He received Edwards sapiens 3 26 mm valve.  He also got history of bilateral foot drop, essential hypertension, dyslipidemia. ?Current Outpatient Medications on File Prior to Visit  ?Medication Sig Dispense Refill  ? acetaminophen (TYLENOL) 650 MG CR tablet Take 650 mg by mouth every 8 (eight) hours as needed for pain.     ? ascorbic acid (VITAMIN C) 500 MG tablet Take 1 tablet (500 mg total) by mouth daily. 30 tablet 0  ? aspirin EC 81 MG tablet Take 81 mg by  mouth daily.    ? cholecalciferol (VITAMIN D) 25 MCG (1000 UNIT) tablet Take 1,000 Units by mouth daily.    ? ezetimibe (ZETIA) 10 MG tablet TAKE 1 TABLET BY MOUTH  DAILY 90 tablet 3  ? fluorometholone (FML) 0.1 % ophthalmic suspension Place 1 drop into both eyes daily.    ? gabapentin (NEURONTIN) 300 MG capsule Take 1 capsule (300 mg total) by mouth 3 (three) times daily. 270 capsule 2  ? lisinopril (ZESTRIL) 5 MG tablet Take 1 tablet (5 mg total) by mouth daily. 14 tablet 0  ? metoprolol succinate (TOPROL XL) 25 MG 24 hr tablet Take 1 tablet (25 mg total) by mouth daily. 30 tablet 0  ? nitroGLYCERIN (NITROSTAT) 0.4 MG SL tablet Place 1 tablet (0.4 mg total) under the tongue every 5 (five) minutes as needed for chest pain. 25 tablet 2  ? pantoprazole (PROTONIX) 40 MG tablet TAKE 1 TABLET BY MOUTH  DAILY 90 tablet 2  ? rosuvastatin (CRESTOR) 20 MG tablet Take 1 tablet (20 mg total) by mouth at bedtime. 90 tablet 3  ? vitamin B-12 (CYANOCOBALAMIN) 500 MCG tablet Take 500 mcg by mouth daily.    ? zinc sulfate 220 (50 Zn) MG capsule Take 1 capsule (220 mg total) by mouth daily. 30 capsule 0  ? albuterol (VENTOLIN HFA) 108 (90 Base) MCG/ACT inhaler Inhale 2 puffs into the lungs every 2 (two) hours as needed for wheezing or shortness of breath. (Patient not taking: Reported on 08/01/2021) 6.7 g 6  ? ?No current facility-administered medications on  file prior to visit.  ? ?Past Medical History:  ?Diagnosis Date  ? Acute bursitis of left shoulder 01/04/2020  ? Acute respiratory failure with hypoxia (HCC) 03/17/2020  ? Angina pectoris (HCC) 10/07/2019  ? Arthritis   ? Atherosclerosis of aorta (HCC) 09/24/2019  ? Bilateral foot-drop 01/18/2017  ? BMI 27.0-27.9,adult 09/24/2019  ? Cellulitis of left hand 07/07/2019  ? Coronary artery disease involving native coronary artery of native heart without angina pectoris 11/01/2015  ? Overview:  Bypass surgery in 1996 Overview:  Overview:  Bypass surgery in 1996  Last Assessment & Plan:   Continue his current regimen.  Follow up as noted.  ? CVA (cerebral vascular accident) (HCC) 04/03/2016  ? Last Assessment & Plan:  The patient underwent extensive CVA workup.  MRI is negative.  He does have aortic stenosis by echocardiogram.  Upon review of Care everywhere he is followed by Dr. Bing Matter, cardiologist for this.  Carotid ultrasound negative for hemodynamically significant stenosis.  Lipid panel within normal limits.  He has not had any further episodes of dizziness or nausea vomiting.   ? Dementia (HCC)   ? Depression   ? Dizziness 04/19/2016  ? DNR (do not resuscitate) 04/03/2016  ? Overview:  I had an extensive discussion with the patient on 04/03/2016 regarding his end of life wishes.  He and his daughter agree that do not resuscitate is in keeping with his beliefs.  ? Dyslipidemia 11/01/2015  ? Last Assessment & Plan:  Formatting of this note might be different from the original. Cholesterol 136   Triglycerides  83  HDL  50.1  LDL Calculated  69  VLDL Cholesterol Cal  17   Continue statin.  ? Dyspnea   ? with activity  ? Dyspnea on exertion 06/30/2018  ? Essential hypertension 11/01/2015  ? Last Assessment & Plan:  Resume home medications.  Follow-up as noted above.  ? Gastroenteritis due to COVID-19 virus 03/17/2020  ? Generalized weakness 03/13/2020  ? GERD (gastroesophageal reflux disease)   ? Herpes zoster 05/19/2019  ? History of aortic stenosis 03/11/2020  ? Myocardial infarction [facility])   ? approx 1994  ? Neuropathy   ? Other chest pain   ? Pneumonia due to COVID-19 virus 03/13/2020  ? Recurrent falls 06/25/2019  ? 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 26 mm Edwards Sapien 3 transcatheter heart valve placed via percutaneous right transfemoral approach  ? Severe aortic stenosis   ? Sleep apnea   ? Spinal stenosis of lumbar region with neurogenic claudication 01/18/2017  ? Status post coronary artery bypass graft 11/01/2015  ? Last Assessment & Plan:  Continue home regimen.  ?  Thrombocytopenia (HCC) 03/13/2020  ? ?Past Surgical History:  ?Procedure Laterality Date  ? CARDIAC CATHETERIZATION    ? CARDIAC SURGERY    ? CATARACT EXTRACTION, BILATERAL    ? CORONARY ARTERY BYPASS GRAFT    ? CORONARY STENT INTERVENTION N/A 10/07/2019  ? Procedure: CORONARY STENT INTERVENTION;  Surgeon: Tonny Bollman, MD;  Location[facility] INVASIVE CV LAB;  Service: Cardiovascular;  Laterality: N/A;  ? EYE SURGERY    ? HEMORROIDECTOMY    ? RIGHT/LEFT HEART CATH AND CORONARY/GRAFT ANGIOGRAPHY N/A 10/07/2019  ? Procedure: RIGHT/LEFT HEART CATH AND CORONARY/GRAFT ANGIOGRAPHY;  Surgeon: Tonny Bollman, MD;  Location: [facility] INVASIVE CV LAB;  Service: Cardiovascular;  Laterality: N/A;  ? TEE WITHOUT CARDIOVERSION N/A 10/27/2019  ? Procedure: TRANSESOPHAGEAL ECHOCARDIOGRAM (TEE);  Surgeon: Kathleene Hazel, MD;  Location: [facility] INVASIVE CV LAB;  Service: Open Heart Surgery;  Laterality: N/A;  ? TRANSCATHETER AORTIC VALVE REPLACEMENT, TRANSFEMORAL N/A 10/27/2019  ? Procedure: TRANSCATHETER AORTIC VALVE REPLACEMENT, TRANSFEMORAL;  Surgeon: Kathleene Hazel, MD;  Location: [facility] INVASIVE CV LAB;  Service: Open Heart Surgery;  Laterality: N/A;  ?  ?Family History  ?Problem Relation Age of Onset  ? Cancer Mother   ? Diabetes Mother   ? Kidney disease Mother   ? Stroke Mother   ? Sleep disorder Mother   ? Arthritis Father   ? Migraines Father   ? Hypertension Father   ? Sleep disorder Father   ? Arthritis Brother   ? Cancer Brother   ? ?Social History  ? ?Socioeconomic History  ? Marital status: Widowed  ?  Spouse name: Dorann Lodge  ? Number of children: 2  ? Years of education: Not on file  ? Highest education level: Not on file  ?Occupational History  ? Occupation: Retired  ?  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 Smoking status: Former  ?  Types: Cigarettes, Cigars  ?  Quit date: 05/02/1962  ?  Years since quitting: 59.2  ? Smokeless tobacco: Never  ?Vaping Use  ? Vaping Use: Never used  ?Substance and  Sexual Activity  ? Alcohol use: No  ? Drug use: No  ? Sexual activity: Not Currently  ?Other Topics Concern  ? Not on file  ?Social History Narrative  ? Wife passed away apx 2015/08/09, son passed away one year later.  He

## 2021-08-02 ENCOUNTER — Telehealth: Payer: Self-pay

## 2021-08-02 NOTE — Progress Notes (Signed)
? ? ?Chronic Care Management ?Pharmacy Assistant  ? ?Name: Jack Hodges  MRN: [id number] DOB: 02-Oct-1930 ? ? ?Reason for Encounter: Disease State call for HTN ?  ?Recent office visits:  ?08/01/21 Jack Meeker MD. Seen for routine visit. D/C Plavix 75mg , Meclizine 25mg  and Sertraline 50mg .  ? ?Recent consult visits:  ?None ? ?Hospital visits:  ?None ? ?Medications: ?Outpatient Encounter Medications as of 08/02/2021  ?Medication Sig  ? acetaminophen (TYLENOL) 650 MG CR tablet Take 650 mg by mouth every 8 (eight) hours as needed for pain.   ? albuterol (VENTOLIN HFA) 108 (90 Base) MCG/ACT inhaler Inhale 2 puffs into the lungs every 2 (two) hours as needed for wheezing or shortness of breath. (Patient not taking: Reported on 08/01/2021)  ? ascorbic acid (VITAMIN C) 500 MG tablet Take 1 tablet (500 mg total) by mouth daily.  ? aspirin EC 81 MG tablet Take 81 mg by mouth daily.  ? cholecalciferol (VITAMIN D) 25 MCG (1000 UNIT) tablet Take 1,000 Units by mouth daily.  ? ezetimibe (ZETIA) 10 MG tablet TAKE 1 TABLET BY MOUTH  DAILY  ? fluorometholone (FML) 0.1 % ophthalmic suspension Place 1 drop into both eyes daily.  ? gabapentin (NEURONTIN) 300 MG capsule Take 1 capsule (300 mg total) by mouth 3 (three) times daily.  ? lisinopril (ZESTRIL) 5 MG tablet Take 1 tablet (5 mg total) by mouth daily.  ? metoprolol succinate (TOPROL XL) 25 MG 24 hr tablet Take 1 tablet (25 mg total) by mouth daily.  ? nitroGLYCERIN (NITROSTAT) 0.4 MG SL tablet Place 1 tablet (0.4 mg total) under the tongue every 5 (five) minutes as needed for chest pain.  ? pantoprazole (PROTONIX) 40 MG tablet TAKE 1 TABLET BY MOUTH  DAILY  ? rosuvastatin (CRESTOR) 20 MG tablet Take 1 tablet (20 mg total) by mouth at bedtime.  ? vitamin B-12 (CYANOCOBALAMIN) 500 MCG tablet Take 500 mcg by mouth daily.  ? zinc sulfate 220 (50 Zn) MG capsule Take 1 capsule (220 mg total) by mouth daily.  ? ?No facility-administered encounter medications on file as of 08/02/2021.   ? ? ? ?Recent Office Vitals: ?BP Readings from Last 3 Encounters:  ?08/01/21 106/72  ?07/04/21 (!) 160/91  ?05/16/21 124/82  ? ?Pulse Readings from Last 3 Encounters:  ?08/01/21 68  ?07/04/21 71  ?05/16/21 63  ?  ?Wt Readings from Last 3 Encounters:  ?08/01/21 166 lb (75.3 kg)  ?05/16/21 168 lb 9.6 oz (76.5 kg)  ?05/03/21 171 lb (77.6 kg)  ?  ? ?Kidney Function ?Lab Results  ?Component Value Date/Time  ? CREATININE 0.86 07/27/2021 10:05 AM  ? CREATININE 0.84 05/03/2021 10:25 AM  ? GFRNONAA 78 05/09/2020 08:26 AM  ? GFRNONAA >60 03/17/2020 12:44 AM  ? GFRAA 90 05/09/2020 08:26 AM  ? ? ? ?  Latest Ref Rng & Units 07/27/2021  ? 10:05 AM 05/03/2021  ? 10:25 AM 07/07/2020  ?  3:17 PM  ?BMP  ?Glucose 70 - 99 mg/dL 83   123   175    ?BUN 8 - 27 mg/dL 11   11   16     ?Creatinine 0.76 - 1.27 mg/dL 0.86   0.84   1.03    ?BUN/Creat Ratio 10 - 24 13   13   16     ?Sodium 134 - 144 mmol/L 146   141   143    ?Potassium 3.5 - 5.2 mmol/L 4.0   4.0   4.1    ?Chloride 96 -  106 mmol/L 109   105   107    ?CO2 20 - 29 mmol/L 22   22   20     ?Calcium 8.6 - 10.2 mg/dL 9.1   9.4   9.3    ? ? ? ?Current antihypertensive regimen:  ?Lisinopril 5mg  daily  ?Metoprolol Succinate 25mg  daily  ?Patient verbally confirms he is taking the above medications as directed. Yes ? ?How often are you checking your Blood Pressure? daily ? ?he checks his blood pressure in the morning before taking his medication. ? ?Current home BP readings: 08/02/21 122/70, 07/04/21 140/100 ? ?Wrist or arm cuff:Arm  ?Caffeine intake:3 cups of coffee  ?Salt intake:Increased  ? ?Any readings above 180/120? Yes, pt stated it has been close to this at times ?If yes any symptoms of hypertensive emergency? Pt stated he always gets SOB but had a pulmonary function test and sees the doctor on a regular basis. Pt denies swelling of the legs today and no chest pain  ? ?What recent interventions/DTPs have been made by any provider to improve Blood Pressure control since last CPP Visit: No  recent changes ? ?Any recent hospitalizations or ED visits since last visit with CPP? No ? ?What diet changes have been made to improve Blood Pressure Control?  ?Pt stated he eats what he wants  ? ?What exercise is being done to improve your Blood Pressure Control?  ?Pt stated is not exercising due to legs and hips are hurting due to drop feet and neuropathy  ? ?Adherence Review: ?Is the patient currently on ACE/ARB medication? Yes ?Does the patient have >5 day gap between last estimated fill dates? CPP to review ? ?Care Gaps: ?Last annual wellness visit?07/13/20 ? ?Star Rating Drugs:  ?Medication:  Last Fill: Day Supply  ?Lisinopril   06/15/21 14ds ?   06/14/21 90ds ? ?Elray Mcgregor, CMA ?Clinical Pharmacist Assistant  ?[phone]  ?

## 2021-08-15 ENCOUNTER — Other Ambulatory Visit: Payer: Self-pay | Admitting: Cardiology

## 2021-08-24 ENCOUNTER — Telehealth: Payer: Self-pay

## 2021-08-24 ENCOUNTER — Other Ambulatory Visit: Payer: Self-pay | Admitting: Family Medicine

## 2021-08-24 MED ORDER — FUROSEMIDE 20 MG PO TABS: 20.0000 mg | ORAL_TABLET | Freq: Every day | ORAL | 0 refills | Status: DC

## 2021-08-24 NOTE — Telephone Encounter (Signed)
Patient complaining of ankle swelling. States he made provider aware when he was last in office. Denies chest pain, chest tightness, shortness of breath. It has not worsened since in office. He is requesting his "fluid pill." However not on current med list. Lasix is on historical med list, last listed in November 2021.   Requesting this be sent to [facility] in [place].  Lorita Officer, [place] 08/24/21 4:08 PM

## 2021-08-24 NOTE — Progress Notes (Unsigned)
Lasix

## 2021-08-25 NOTE — Telephone Encounter (Signed)
Left message that lasix was sent to the pharmacy.

## 2021-09-08 ENCOUNTER — Other Ambulatory Visit: Payer: Self-pay

## 2021-09-08 MED ORDER — LISINOPRIL 5 MG PO TABS: 5.0000 mg | ORAL_TABLET | Freq: Every day | ORAL | 0 refills | Status: DC

## 2021-09-15 ENCOUNTER — Ambulatory Visit (INDEPENDENT_AMBULATORY_CARE_PROVIDER_SITE_OTHER): Payer: Medicare Other

## 2021-09-15 DIAGNOSIS — I10 Essential (primary) hypertension: Secondary | ICD-10-CM

## 2021-09-15 DIAGNOSIS — E782 Mixed hyperlipidemia: Secondary | ICD-10-CM

## 2021-09-15 DIAGNOSIS — E785 Hyperlipidemia, unspecified: Secondary | ICD-10-CM

## 2021-09-15 NOTE — Progress Notes (Signed)
Chronic Care Management Pharmacy Note  09/15/2021 Name:  Maribel IRFAN VEAL MRN:  [id number] DOB:  08/22/1930   Plan Updates:  Patient's neuropathy not controlled, will ask PCP about changing SSRI to SNRI for mental health AND neuropathy control Patient has no f/u with a PCP, will ask Dr. Tobie Poet how to proceed BP elevated, sent msg to Cardio to evaluate   Subjective: Ranell W Sikorski is an 88 y.o. year old male who is a primary patient of Henrene Pastor, Zeb Comfort, MD.  The CCM team was consulted for assistance with disease management and care coordination needs.    Engaged with patient by telephone for follow up visit in response to provider referral for pharmacy case management and/or care coordination services.   Consent to Services:  The patient was given the following information about Chronic Care Management services today, agreed to services, and gave verbal consent: 1. CCM service includes personalized support from designated clinical staff supervised by the primary care provider, including individualized plan of care and coordination with other care providers 2. 24/7 contact phone numbers for assistance for urgent and routine care needs. 3. Service will only be billed when office clinical staff spend 20 minutes or more in a month to coordinate care. 4. Only one practitioner may furnish and bill the service in a calendar month. 5.The patient may stop CCM services at any time (effective at the end of the month) by phone call to the office staff. 6. The patient will be responsible for cost sharing (co-pay) of up to 20% of the service fee (after annual deductible is met). Patient agreed to services and consent obtained.  Patient Care Team: Lillard Anes, MD as PCP - General (Family Medicine) Park Liter, MD as PCP - Cardiology (Cardiology) Lane Hacker, [facility] as Pharmacist (Pharmacist)  Recent office visits:  None   Recent consult visits:  None   Hospital visits:  None      Objective:  Lab Results  Component Value Date   CREATININE 0.86 07/27/2021   BUN 11 07/27/2021   GFRNONAA 78 05/09/2020   GFRAA 90 05/09/2020   NA 146 (H) 07/27/2021   K 4.0 07/27/2021   CALCIUM 9.1 07/27/2021   CO2 22 07/27/2021   GLUCOSE 83 07/27/2021    Lab Results  Component Value Date/Time   HGBA1C 4.8 10/23/2019 09:55 AM    Last diabetic Eye exam: No results found for: "HMDIABEYEEXA"  Last diabetic Foot exam: No results found for: "HMDIABFOOTEX"   Lab Results  Component Value Date   CHOL 166 07/27/2021   HDL 47 07/27/2021   LDLCALC 99 07/27/2021   TRIG 109 07/27/2021   CHOLHDL 3.5 07/27/2021       Latest Ref Rng & Units 07/27/2021   10:05 AM 05/03/2021   10:25 AM 05/09/2020    8:26 AM  Hepatic Function  Total Protein 6.0 - 8.5 g/dL 6.4  5.9  6.2   Albumin 3.6 - 4.6 g/dL 4.2  4.2  4.1   AST 0 - 40 IU/L '21  24  23   ' ALT 0 - 44 IU/L '13  18  12   ' Alk Phosphatase 44 - 121 IU/L 84  79  74   Total Bilirubin 0.0 - 1.2 mg/dL 0.7  0.5  0.6     No results found for: "TSH", "FREET4"     Latest Ref Rng & Units 07/27/2021   10:05 AM 05/03/2021   10:25 AM 05/09/2020    8:26 AM  CBC  WBC 3.4 - 10.8 x10E3/uL 7.9  7.1  6.5   Hemoglobin 13.0 - 17.7 g/dL 14.6  13.4  11.7   Hematocrit 37.5 - 51.0 % 44.4  40.7  36.8   Platelets 150 - 450 x10E3/uL 157  195  98     No results found for: "VD25OH"  Clinical ASCVD: Yes  The ASCVD Risk score (Arnett DK, et al., 2019) failed to calculate for the following reasons:   The 2019 ASCVD risk score is only valid for ages 57 to 26   The patient has a prior MI or stroke diagnosis       08/01/2021    2:17 PM 05/16/2021   10:32 AM 07/13/2020    8:57 AM  Depression screen PHQ 2/9  Decreased Interest 0 0 1  Down, Depressed, Hopeless 0 0 1  PHQ - 2 Score 0 0 2  Altered sleeping  1 0  Tired, decreased energy  0 0  Change in appetite  0 0  Feeling bad or failure about yourself   1 0  Trouble concentrating  0 0  Moving slowly or  fidgety/restless  0 0  Suicidal thoughts  0 0  PHQ-9 Score  2 2      Social History   Tobacco Use  Smoking Status Former   Types: Cigarettes, Cigars   Quit date: 05/02/1962   Years since quitting: 59.4  Smokeless Tobacco Never   BP Readings from Last 3 Encounters:  08/01/21 106/72  07/04/21 (!) 160/91  05/16/21 124/82   Pulse Readings from Last 3 Encounters:  08/01/21 68  07/04/21 71  05/16/21 63   Wt Readings from Last 3 Encounters:  08/01/21 166 lb (75.3 kg)  05/16/21 168 lb 9.6 oz (76.5 kg)  05/03/21 171 lb (77.6 kg)   BMI Readings from Last 3 Encounters:  08/01/21 27.62 kg/m  05/16/21 28.06 kg/m  05/03/21 28.46 kg/m    Assessment/Interventions: Review of patient past medical history, allergies, medications, health status, including review of consultants reports, laboratory and other test data, was performed as part of comprehensive evaluation and provision of chronic care management services.   SDOH:  (Social Determinants of Health) assessments and interventions performed: Yes SDOH Interventions    Flowsheet Row Most Recent Value  SDOH Interventions   Financial Strain Interventions Intervention Not Indicated  Transportation Interventions Intervention Not Indicated       SDOH Screenings   Alcohol Screen: Low Risk  (07/13/2020)   Alcohol Screen    Last Alcohol Screening Score (AUDIT): 0  Depression (PHQ2-9): Low Risk  (08/01/2021)   Depression (PHQ2-9)    PHQ-2 Score: 0  Financial Resource Strain: Low Risk  (09/15/2021)   Overall Financial Resource Strain (CARDIA)    Difficulty of Paying Living Expenses: Not hard at all  Food Insecurity: No Food Insecurity (07/13/2020)   Hunger Vital Sign    Worried About Running Out of Food in the Last Year: Never true    Ran Out of Food in the Last Year: Never true  Housing: Low Risk  (07/13/2020)   Housing    Last Housing Risk Score: 0  Physical Activity: Insufficiently Active (07/13/2020)   Exercise Vital Sign     Days of Exercise per Week: 2 days    Minutes of Exercise per Session: 10 min  Social Connections: Moderately Isolated (07/13/2020)   Social Connection and Isolation Panel [NHANES]    Frequency of Communication with Friends and Family: More than three times a week  Frequency of Social Gatherings with Friends and Family: More than three times a week    Attends Religious Services: 1 to 4 times per year    Active Member of [employer] or Organizations: No    Attends Archivist Meetings: Never    Marital Status: Widowed  Stress: No Stress Concern Present (07/13/2020)   Ada    Feeling of Stress : Not at all  Tobacco Use: Medium Risk (08/01/2021)   Patient History    Smoking Tobacco Use: Former    Smokeless Tobacco Use: Never    Passive Exposure: Not on file  Transportation Needs: No Transportation Needs (09/15/2021)   PRAPARE - Hydrologist (Medical): No    Lack of Transportation (Non-Medical): No    CCM Care Plan  Allergies  Allergen Reactions   Poison Oak Extract Rash    Medications Reviewed Today     Reviewed by Lane Hacker, [facility] (Pharmacist) on 09/15/21 at 15  Med List Status: <None>   Medication Order Taking? Sig Documenting Provider Last Dose Status Informant  acetaminophen (TYLENOL) 650 MG CR tablet [id number]  Take 650 mg by mouth every 8 (eight) hours as needed for pain.  [provider]  Active Self  albuterol (VENTOLIN HFA) 108 (90 Base) MCG/ACT inhaler [id number]  Inhale 2 puffs into the lungs every 2 (two) hours as needed for wheezing or shortness of breath.  Patient not taking: Reported on 08/01/2021   Lillard Anes, MD  Active   ascorbic acid (VITAMIN C) 500 MG tablet [id number]  Take 1 tablet (500 mg total) by mouth daily. Allie Bossier, MD  Active   aspirin EC 81 MG tablet [id number]  Take 81 mg by mouth daily. [provider]  Active  Self  cholecalciferol (VITAMIN D) 25 MCG (1000 UNIT) tablet [id number]  Take 1,000 Units by mouth daily. [provider]  Active   ezetimibe (ZETIA) 10 MG tablet [id number]  TAKE 1 TABLET BY MOUTH  DAILY Lillard Anes, MD  Active   fluorometholone (FML) 0.1 % ophthalmic suspension [id number]  Place 1 drop into both eyes daily. [provider]  Active   furosemide (LASIX) 20 MG tablet [id number]  Take 1 tablet (20 mg total) by mouth daily. Cox, Kirsten, MD  Active   gabapentin (NEURONTIN) 300 MG capsule [id number]  Take 1 capsule (300 mg total) by mouth 3 (three) times daily. Lillard Anes, MD  Active   lisinopril (ZESTRIL) 5 MG tablet [id number]  Take 1 tablet (5 mg total) by mouth daily. Cox, Kirsten, MD  Active   metoprolol succinate (TOPROL XL) 25 MG 24 hr tablet [id number]  Take 1 tablet (25 mg total) by mouth daily. Park Liter, MD  Active   nitroGLYCERIN (NITROSTAT) 0.4 MG SL tablet [id number]  Place 1 tablet (0.4 mg total) under the tongue every 5 (five) minutes as needed for chest pain. Darreld Mclean, PA-C  Expired 08/01/21 2359 Self  pantoprazole (PROTONIX) 40 MG tablet [id number]  TAKE 1 TABLET BY MOUTH  DAILY Lillard Anes, MD  Active   rosuvastatin (CRESTOR) 20 MG tablet [id number]  Take 1 tablet (20 mg total) by mouth at bedtime. Lillard Anes, MD  Active   vitamin B-12 (CYANOCOBALAMIN) 500 MCG tablet [id number]  Take 500 mcg by mouth daily. [provider]  Active   zinc sulfate 220 (50 Zn) MG capsule [id number]  Take 1 capsule (  220 mg total) by mouth daily. Allie Bossier, MD  Active             Patient Active Problem List   Diagnosis Date Noted   Open wound of elbow, right, initial encounter 05/16/2021   Trochanteric bursitis of left hip 01/19/2021   Sleep apnea    Other chest pain    Neuropathy    Myocardial infarction [facility])    GERD (gastroesophageal reflux disease)    Dyspnea    Depression    Dementia  (HCC)    Arthritis    Acute respiratory failure with hypoxia ([place]) 03/17/2020   Gastroenteritis due to COVID-19 virus 03/17/2020   Generalized weakness 03/13/2020   Thrombocytopenia ([place]) 03/13/2020   Pneumonia due to COVID-19 virus 03/13/2020   History of aortic stenosis 03/11/2020   Acute bursitis of left shoulder 01/04/2020   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   Angina pectoris ([place]) 10/07/2019   Atherosclerosis of aorta ([place]) 09/24/2019   BMI 27.0-27.9,adult 09/24/2019   Recurrent falls 06/25/2019   Herpes zoster 05/19/2019   Dyspnea on exertion 06/30/2018   Bilateral foot-drop 01/18/2017   Spinal stenosis of lumbar region with neurogenic claudication 01/18/2017   Dizziness 04/19/2016   CVA (cerebral vascular accident) ([place]) 04/03/2016   DNR (do not resuscitate) 04/03/2016   Coronary artery disease involving native coronary artery of native heart without angina pectoris 11/01/2015   Dyslipidemia 11/01/2015   Essential hypertension 11/01/2015   Status post coronary artery bypass graft 11/01/2015    Immunization History  Administered Date(s) Administered   Fluad Quad(high Dose 65+) 12/10/2018, 01/25/2020, 01/12/2021   Pneumococcal Conjugate-13 12/20/2017   Pneumococcal Polysaccharide-23 03/20/2012   Tdap 07/03/2019   Zoster Recombinat (Shingrix) 09/02/2020    Conditions to be addressed/monitored:  Hypertension, Hyperlipidemia, Coronary Artery Disease and GERD  Care Plan : CCM Pharmacy Care Plan  Updates made by Lane Hacker, [place] since 09/15/2021 12:00 AM     Problem: cad, htn and hld   Priority: High  Onset Date: 07/27/2020     Goal: Disease State Management   Start Date: 07/27/2020  Expected End Date: 07/27/2021  Recent Progress: On track  Priority: High  Note:    Current Barriers:  Unable to self administer medications as prescribed  Pharmacist Clinical Goal(s):  Patient will adhere to prescribed medication regimen as evidenced by  fill history through collaboration with PharmD and provider.   Interventions: 1:1 collaboration with Lillard Anes, MD regarding development and update of comprehensive plan of care as evidenced by provider attestation and co-signature Inter-disciplinary care team collaboration (see longitudinal plan of care) Comprehensive medication review performed; medication list updated in electronic medical record  Hypertension (BP goal <130/80) -Controlled -Current treatment: Lisinopril 5 mg daily Appropriate, Query effective, Safe, Accessible Metoprolol succinate 25 mg daily (Hasn't picked up since August 2022) Appropriate, Query effective, Safe, Accessible -Medications previously tried:  none reported  -Current home readings:    November 2022:   -01/26/21: 139/88 and 75 -01/25/21: 128/86  March 2023:  -06/07/21: 130/86  -06/06/21: 141/82, 171/87 -06/05/21: 191/110 -06/04/21: 178/94 June 2023:  -09/15/21: 119/73 -09/14/21: 162/86 -09/13/21: 174/83 -09/12/21: 118/69 -09/11/21: 114/72 -Current dietary habits: eats with his sister -Current exercise habits: walks around house with a cane -Denies hypotensive/hypertensive symptoms -Educated on BP goals and benefits of medications for prevention of heart attack, stroke and kidney damage; Daily salt intake goal < 2300 mg; Exercise goal of 150 minutes per week; -Counseled to monitor BP at home daily,  document, and provide log at future appointments -Counseled on diet and exercise extensively March 2023: BP elevated, will let Cardio know ASAP. Told patient to call me everyday his BP is above goal June 2023: BP still elevated, also patient is non-compliant on Metoprolol. Let Cardio know  Hyperlipidemia/CAD: (LDL goal < 55) -Controlled -Current treatment: rosuvastatin 20 mg daily Appropriate, Effective, Safe, Accessible Ezetimibe 59m Appropriate, Effective, Safe, Accessible Aspirin 81 mg daily Appropriate, Effective, Safe,  Accessible -Medications previously tried: Clopidogrel -Current dietary patterns: eats with his sister some  -Current exercise habits: has foot drop and walks with a cane -Educated on Cholesterol goals;  Benefits of statin for ASCVD risk reduction; Importance of limiting foods high in cholesterol; -Counseled on diet and exercise extensively Recommended to continue current medication   Depression/Anxiety -Controlled -Current treatment: Sertraline 51mAppropriate, Effective, Safe, Accessible -Medications previously tried/failed:  -PHQ9:  [facility] 05/16/2021 07/13/2020 08/05/2019  Decreased Interest 0 1 1  Down, Depressed, Hopeless 0 1 1  PHQ - 2 Score 0 2 2  Altered sleeping 1 0 -  Tired, decreased energy 0 0 -  Change in appetite 0 0 -  Feeling bad or failure about yourself  1 0 -  Trouble concentrating 0 0 -  Moving slowly or fidgety/restless 0 0 -  Suicidal thoughts 0 0 -  PHQ-9 Score 2 2 -  -GAD7: No flowsheet data found. -Educated on Benefits of medication for symptom control November 2022: Will ask PCP to change to SNRI for neuropathy March 2023: Will ask PCP to change to SNRI for neuropathy, didn't hear back from last recommendation June 2023: Will ask PCP to change to SNRI for neuropathy, didn't hear back from last recommendation  Chronic Pain -Uncontrolled -Current treatment: Gabapentin Appropriate, Effective, Safe, Accessible -Medications previously tried: N/A  -Pain Scale Without Meds: 6/10  With Meds: 6/10  Aggravating Factors: Movement  Pain Type: Neuropathy  November 2022: Will ask for SNRI to help March 2023: Will ask PCP to change to SNRI for neuropathy, didn't hear back from last recommendation June 2023: Coordinated to get patient consult for Neuro. He stated he spoke with a friend who said, "Don't go to a neurologist, all they do is prescribe you Gabapentin and send you on your way." He is not interested in going anymore  Tremor (Goal:  Decrease Symptoms) -Uncontrolled -Current treatment  None -Medications previously tried: None  March 2023: Will coordinate with team to get Neuro referral sent. Patient has fallen multiple times and with his DAPT this could be harmful June 2023: Not interested in Neuro but off DAPT    Patient Goals/Self-Care Activities Patient will:  - take medications as prescribed focus on medication adherence by calling office or pharmacy when refill is needed check blood pressure daily, document, and provide at future appointments engage in dietary modifications by limiting salt and avoiding high fat/fried foods  Follow Up Plan: Telephone follow up appointment with care management team member scheduled for: December 2023  Na[place]zona ConstablePhSherian Rein. - [phone]      Care Gaps: Last annual wellness visit? 07/13/20    Medication Assistance: None required.  Patient affirms current coverage meets needs.  Patient's preferred pharmacy is:  [place] [id number]: [phone]: [phone] ([facility]one:  [phone] Fax: [place] [address] [place] [address] Phone: [phone] Fax: [phone]  [facility] ([facility] ) - [place], [address] [place] [address] Phone: [phone] Fax: [phone]   Uses pill box? No - tremor makes it harder to manage box Pt endorses fair compliance   We discussed: Current pharmacy is preferred with insurance plan and patient is satisfied with pharmacy services Patient decided to: Continue current medication management strategy  Care Plan and Follow Up Patient Decision:  Patient agrees to Care Plan and  Follow-up.  Plan: Telephone follow up appointment with care management team member scheduled for:  December 2023   [place] Constable, [place].D. - [phone]

## 2021-09-16 ENCOUNTER — Other Ambulatory Visit: Payer: Self-pay | Admitting: Legal Medicine

## 2021-09-16 DIAGNOSIS — K2100 Gastro-esophageal reflux disease with esophagitis, without bleeding: Secondary | ICD-10-CM

## 2021-09-21 ENCOUNTER — Telehealth: Payer: Self-pay | Admitting: Cardiology

## 2021-09-21 NOTE — Telephone Encounter (Signed)
  Pt c/o BP issue: STAT if pt c/o blurred vision, one-sided weakness or slurred speech  1. What are your last 5 BP readings?  94/76 74  144/74 68  90/63 78  102/46 70  102/65 72  2. Are you having any other symptoms (ex. Dizziness, headache, blurred vision, passed out)?   3. What is your BP issue? Pt said, his pcp advised him to call Dr. Kirtland Bouchard due to his low BP readings

## 2021-09-21 NOTE — Progress Notes (Signed)
Coordinated with PCP regarding Sertraline

## 2021-09-22 DIAGNOSIS — F32A Depression, unspecified: Secondary | ICD-10-CM | POA: Diagnosis not present

## 2021-09-22 DIAGNOSIS — E785 Hyperlipidemia, unspecified: Secondary | ICD-10-CM | POA: Diagnosis not present

## 2021-09-22 DIAGNOSIS — I2510 Atherosclerotic heart disease of native coronary artery without angina pectoris: Secondary | ICD-10-CM | POA: Diagnosis not present

## 2021-09-22 DIAGNOSIS — I10 Essential (primary) hypertension: Secondary | ICD-10-CM | POA: Diagnosis not present

## 2021-09-22 NOTE — Telephone Encounter (Signed)
Spoke with pt regarding Dr. Vanetta Shawl note. Encouraged to drink plenty of fluids, keep a blood pressure log and follow up at 10-16-2021.

## 2021-10-06 ENCOUNTER — Other Ambulatory Visit: Payer: Self-pay

## 2021-10-07 ENCOUNTER — Other Ambulatory Visit: Payer: Self-pay | Admitting: Family Medicine

## 2021-10-09 NOTE — Progress Notes (Signed)
Subjective:  Patient ID: Jack Hodges, male    DOB: 05/27/1930  Age: 88 y.o. MRN: [id number]  Chief Complaint  Patient presents with   Hypertension   Hyperlipidemia   Coronary Artery Disease    HPI   Patient presents with hyperlipidemia.  Compliance with treatment has been good; patient takes medicines as directed, maintains low cholesterol diet, follows up as directed, and maintains exercise regimen.  Patient is using Zetia 10 mg daily, rosuvastatin 20 mg daily without problems.   Patient presents for follow up of hypertension.  Patient tolerating Lisinopril 5 mg daily, aspirin 81 mg daily,  well without side effects. Patient is working on maintaining diet and exercise regimen and follows up as directed.   GERD: He takes Pantopraxole 40 mg daily.  CAD occasional angina Current Outpatient Medications on File Prior to Visit  Medication Sig Dispense Refill   acetaminophen (TYLENOL) 650 MG CR tablet Take 650 mg by mouth every 8 (eight) hours as needed for pain.      albuterol (VENTOLIN HFA) 108 (90 Base) MCG/ACT inhaler Inhale 2 puffs into the lungs every 2 (two) hours as needed for wheezing or shortness of breath. (Patient not taking: Reported on 08/01/2021) 6.7 g 6   ascorbic acid (VITAMIN C) 500 MG tablet Take 1 tablet (500 mg total) by mouth daily. 30 tablet 0   aspirin EC 81 MG tablet Take 81 mg by mouth daily.     cholecalciferol (VITAMIN D) 25 MCG (1000 UNIT) tablet Take 1,000 Units by mouth daily.     ezetimibe (ZETIA) 10 MG tablet TAKE 1 TABLET BY MOUTH  DAILY 90 tablet 3   fluorometholone (FML) 0.1 % ophthalmic suspension Place 1 drop into both eyes daily.     furosemide (LASIX) 20 MG tablet Take 1 tablet (20 mg total) by mouth daily. 30 tablet 0   gabapentin (NEURONTIN) 300 MG capsule Take 1 capsule (300 mg total) by mouth 3 (three) times daily. 270 capsule 2   nitroGLYCERIN (NITROSTAT) 0.4 MG SL tablet Place 1 tablet (0.4 mg total) under the tongue every 5 (five) minutes as  needed for chest pain. 25 tablet 2   pantoprazole (PROTONIX) 40 MG tablet TAKE 1 TABLET BY MOUTH DAILY 100 tablet 2   rosuvastatin (CRESTOR) 20 MG tablet Take 1 tablet (20 mg total) by mouth at bedtime. 90 tablet 3   vitamin B-12 (CYANOCOBALAMIN) 500 MCG tablet Take 500 mcg by mouth daily.     zinc sulfate 220 (50 Zn) MG capsule Take 1 capsule (220 mg total) by mouth daily. 30 capsule 0   No current facility-administered medications on file prior to visit.   Past Medical History:  Diagnosis Date   Acute bursitis of left shoulder 01/04/2020   Acute respiratory failure with hypoxia (HCC) 03/17/2020   Angina pectoris (HCC) 10/07/2019   Arthritis    Atherosclerosis of aorta (HCC) 09/24/2019   Bilateral foot-drop 01/18/2017   BMI 27.0-27.9,adult 09/24/2019   Cellulitis of left hand 07/07/2019   Coronary artery disease involving native coronary artery of native heart without angina pectoris 11/01/2015   Overview:  Bypass surgery in 1996 Overview:  Overview:  Bypass surgery in 1996  Last Assessment & Plan:  Continue his current regimen.  Follow up as noted.   CVA (cerebral vascular accident) (HCC) 04/03/2016   Last Assessment & Plan:  The patient underwent extensive CVA workup.  MRI is negative.  He does have aortic stenosis by echocardiogram.  Upon review of Care everywhere  he is followed by Dr. Bing Matter, cardiologist for this.  Carotid ultrasound negative for hemodynamically significant stenosis.  Lipid panel within normal limits.  He has not had any further episodes of dizziness or nausea vomiting.    Dementia (HCC)    Depression    Dizziness 04/19/2016   DNR (do not resuscitate) 04/03/2016   Overview:  I had an extensive discussion with the patient on 04/03/2016 regarding his end of life wishes.  He and his daughter agree that do not resuscitate is in keeping with his beliefs.   Dyslipidemia 11/01/2015   Last Assessment & Plan:  Formatting of this note might be different from the original. Cholesterol 136    Triglycerides  83  HDL  50.1  LDL Calculated  69  VLDL Cholesterol Cal  17   Continue statin.   Dyspnea    with activity   Dyspnea on exertion 06/30/2018   Essential hypertension 11/01/2015   Last Assessment & Plan:  Resume home medications.  Follow-up as noted above.   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   Thrombocytopenia (HCC) 03/13/2020   Past Surgical History:  Procedure Laterality Date   CARDIAC CATHETERIZATION     CARDIAC SURGERY     CATARACT EXTRACTION, BILATERAL     CORONARY ARTERY BYPASS GRAFT     CORONARY STENT INTERVENTION N/A 10/07/2019   Procedure: CORONARY STENT INTERVENTION;  Surgeon: Tonny Bollman, MD;  Location: [facility] INVASIVE CV LAB;  Service: Cardiovascular;  Laterality: N/A;   EYE SURGERY     HEMORROIDECTOMY     RIGHT/LEFT HEART CATH AND CORONARY/GRAFT ANGIOGRAPHY N/A 10/07/2019   Procedure: RIGHT/LEFT HEART CATH AND CORONARY/GRAFT ANGIOGRAPHY;  Surgeon: Tonny Bollman, MD;  Location: [facility] INVASIVE CV LAB;  Service: Cardiovascular;  Laterality: N/A;   TEE WITHOUT CARDIOVERSION N/A 10/27/2019   Procedure: TRANSESOPHAGEAL ECHOCARDIOGRAM (TEE);  Surgeon: Kathleene Hazel, MD;  Location: [facility] INVASIVE CV LAB;  Service: Open Heart Surgery;  Laterality: N/A;   TRANSCATHETER AORTIC VALVE  REPLACEMENT, TRANSFEMORAL N/A 10/27/2019   Procedure: TRANSCATHETER AORTIC VALVE REPLACEMENT, TRANSFEMORAL;  Surgeon: Kathleene Hazel, MD;  Location: [facility] INVASIVE CV LAB;  Service: Open Heart Surgery;  Laterality: N/A;    Family History  Problem Relation Age of Onset   Cancer Mother    Diabetes Mother    Kidney disease Mother    Stroke Mother    Sleep disorder Mother    Arthritis Father    Migraines Father    Hypertension Father    Sleep disorder Father    Arthritis Brother    Cancer Brother    Social History   Socioeconomic History   Marital status: Widowed    Spouse name: Dorann Lodge   Number of children: 2   Years of education: Not on file   Highest  education level: Not on file  Occupational History   Occupation: Retired    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  He has one son that lives with him and one daughter that lives beside of him. He is close with his family.  His sister cooks meals for him regularly.  He has friends at the cafe he visits regularly.   Social Determinants of Health   Financial Resource Strain: Low Risk  (09/15/2021)   Overall Financial Resource Strain (CARDIA)    Difficulty of Paying Living Expenses: Not hard at all  Food Insecurity: No Food Insecurity (07/13/2020)   Hunger Vital Sign    Worried About Running Out of Food in the Last Year: Never true    Ran Out of Food in the Last Year: Never true  Transportation Needs: No Transportation Needs (09/15/2021)   PRAPARE - Administrator, Civil Service (Medical): No    Lack of  Transportation (Non-Medical): No  Physical Activity: Insufficiently Active (07/13/2020)   Exercise Vital Sign    Days of Exercise per Week: 2 days    Minutes of Exercise per Session: 10 min  Stress: No Stress Concern Present (07/13/2020)   [employer] of Occupational Health - Occupational Stress Questionnaire    Feeling of Stress : Not at all  Social Connections: Moderately Isolated (07/13/2020)   Social Connection and Isolation Panel [NHANES]    Frequency of Communication with Friends and Family: More than three times a week    Frequency of Social Gatherings with Friends and Family: More than three times a week    Attends Religious Services: 1 to 4 times per year    Active Member of [employer] or Organizations: No    Attends Banker Meetings: Never    Marital Status: Widowed    Review of Systems  Constitutional:  Negative for chills, fatigue, fever and unexpected weight change.  HENT:  Negative for congestion, ear pain, sinus pain and sore throat.   Eyes:  Negative for visual disturbance.  Respiratory:  Negative for cough and shortness of breath.   Cardiovascular:  Negative for chest pain and palpitations.  Gastrointestinal:  Negative for abdominal pain, blood in stool, constipation, diarrhea, nausea and vomiting.  Endocrine: Negative for polydipsia.  Genitourinary:  Negative for dysuria.  Musculoskeletal:  Negative for back pain.  Skin:  Negative for rash.  Neurological:  Negative for headaches.     Objective:  BP (!) 180/100   Pulse 64   Temp (!) 97.5 F (36.4 C)   Resp 15   Ht 5' 3.78" (1.62 m)   Wt 163 lb (73.9 kg)   SpO2 98%   BMI 28.17 kg/m      10/11/2021    8:39 AM 08/01/2021    2:08 PM 07/04/2021   11:07 AM  BP/Weight  Systolic BP 180 106 160  Diastolic BP 100 72 91  Wt. (Lbs) 163 166   BMI 28.17 kg/m2 27.62 kg/m2  Physical Exam Vitals reviewed. Nursing note reviewed: walks with cane. Constitutional:      General: He is not in acute  distress.    Appearance: Normal appearance.  HENT:     Head: Normocephalic and atraumatic.     Right Ear: Tympanic membrane normal.     Left Ear: Tympanic membrane normal.     Mouth/Throat:     Mouth: Mucous membranes are moist.     Pharynx: Oropharynx is clear.  Eyes:     Extraocular Movements: Extraocular movements intact.     Conjunctiva/sclera: Conjunctivae normal.     Pupils: Pupils are equal, round, and reactive to light.  Cardiovascular:     Rate and Rhythm: Normal rate and regular rhythm.     Pulses: Normal pulses.     Heart sounds: Normal heart sounds. No murmur heard.    No gallop.  Pulmonary:     Effort: Pulmonary effort is normal. No respiratory distress.     Breath sounds: Normal breath sounds. No wheezing.  Abdominal:     General: Abdomen is flat. Bowel sounds are normal. There is no distension.     Palpations: Abdomen is soft.     Tenderness: There is no abdominal tenderness.  Musculoskeletal:        General: Normal range of motion.     Cervical back: Normal range of motion.     Right lower leg: No edema.     Left lower leg: No edema.     Comments: Bilateral foot drop with AFO  Skin:    General: Skin is warm and dry.     Capillary Refill: Capillary refill takes less than 2 seconds.  Neurological:     General: No focal deficit present.     Mental Status: He is alert and oriented to person, place, and time. Mental status is at baseline.     Gait: Gait abnormal.     Deep Tendon Reflexes: Reflexes normal.  Psychiatric:        Mood and Affect: Mood normal.        Thought Content: Thought content normal.        Judgment: Judgment normal.     Diabetic Foot Exam - Simple   Simple Foot Form Diabetic Foot exam was performed with the following findings: Yes 10/11/2021  9:07 AM  Visual Inspection See comments: Yes Sensation Testing See comments: Yes Pulse Check Posterior Tibialis and Dorsalis pulse intact bilaterally: Yes Comments Bunions, foot drop, decrease  sensation      Lab Results  Component Value Date   WBC 8.7 10/11/2021   HGB 13.0 10/11/2021   HCT 40.8 10/11/2021   PLT 185 10/11/2021   GLUCOSE 101 (H) 10/11/2021   CHOL 126 10/11/2021   TRIG 147 10/11/2021   HDL 43 10/11/2021   LDLCALC 58 10/11/2021   ALT 11 10/11/2021   AST 21 10/11/2021   NA 142 10/11/2021   K 4.1 10/11/2021   CL 106 10/11/2021   CREATININE 0.92 10/11/2021   BUN 13 10/11/2021   CO2 22 10/11/2021   INR 1.1 10/23/2019   HGBA1C 4.8 10/23/2019      Assessment & Plan:   Problem List Items Addressed This Visit       Cardiovascular and Mediastinum   Coronary artery disease involving native coronary artery of native heart without angina pectoris - Primary   Relevant Medications   metoprolol succinate (TOPROL XL) 25 MG 24 hr tablet   lisinopril (ZESTRIL) 5 MG tablet An individual plan  was formulated based on patient history and exam, labs and evidence based data. Patient has had recent angina or nitroglycerin use. continue present treatment. Needs cardiology follow up    Essential hypertension   Relevant Medications   metoprolol succinate (TOPROL XL) 25 MG 24 hr tablet   lisinopril (ZESTRIL) 5 MG tablet   Other Relevant Orders   Comprehensive metabolic panel (Completed)   CBC with Differential/Platelet (Completed) An individual hypertension care plan was established and reinforced today.  The patient's status was assessed using clinical findings on exam and labs or diagnostic tests. The patient's success at meeting treatment goals on disease specific evidence-based guidelines and found to be poor controlled. Restart metoprolol, recheck 2 weeks SELF MANAGEMENT: The patient and I together assessed ways to personally work towards obtaining the recommended goals. RECOMMENDATIONS: avoid decongestants found in common cold remedies, decrease consumption of alcohol, perform routine monitoring of BP with home BP cuff, exercise, reduction of dietary salt, take  medicines as prescribed, try not to miss doses and quit smoking.  Regular exercise and maintaining a healthy weight is needed.  Stress reduction may help. A CLINICAL SUMMARY including written plan identify barriers to care unique to individual due to social or financial issues.  We attempt to mutually creat solutions for individual and family understanding.     Atherosclerosis of aorta (HCC)   Relevant Medications   metoprolol succinate (TOPROL XL) 25 MG 24 hr tablet   lisinopril (ZESTRIL) 5 MG tablet Patient is on crestor    Angina pectoris (HCC)   Relevant Medications   metoprolol succinate (TOPROL XL) 25 MG 24 hr tablet   lisinopril (ZESTRIL) 5 MG tablet Restart metoprolol     Respiratory   Acute respiratory failure with hypoxia (HCC)    Stopped home oxygen        Digestive   GERD (gastroesophageal reflux disease) Plan of care was formulated today.  he is doing well.  A plan of care was formulated using patient exam, tests and other sources to optimize care using evidence based information.  Recommend no smoking, no eating after supper, avoid fatty foods, elevate Head of bed, avoid tight fitting clothing.  Continue on pantoprazole.      Nervous and Auditory   Neuropathy Patient has leg neuropathy with bilateral foot drop     Other   Dyslipidemia   Relevant Orders   Lipid panel (Completed) AN INDIVIDUAL CARE PLAN for hyperlipidemia/ cholesterol was established and reinforced today.  The patient's status was assessed using clinical findings on exam, lab and other diagnostic tests. The patient's disease status was assessed based on evidence-based guidelines and found to be well controlled. MEDICATIONS were reviewed. SELF MANAGEMENT GOALS have been discussed and patient's success at attaining the goal of low cholesterol was assessed. RECOMMENDATION given include regular exercise 3 days a week and low cholesterol/low fat diet. CLINICAL SUMMARY including written plan to identify  barriers unique to the patient due to social or economic  reasons was discussed.   .  Meds ordered this encounter  Medications   metoprolol succinate (TOPROL XL) 25 MG 24 hr tablet    Sig: Take 1 tablet (25 mg total) by mouth daily.    Dispense:  90 tablet    Refill:  2   lisinopril (ZESTRIL) 5 MG tablet    Sig: Take 1 tablet (5 mg total) by mouth daily.    Dispense:  90 tablet    Refill:  2    Orders Placed This Encounter  Procedures   Comprehensive metabolic panel   Lipid panel   CBC with Differential/Platelet   Cardiovascular Risk Assessment     Follow-up: Return in about 3 months (around 01/11/2022), or 2 weeks nurse visit for BP.  An After Visit Summary was printed and given to the patient.  Brent Bulla, MD [facility] [phone]

## 2021-10-11 ENCOUNTER — Ambulatory Visit (INDEPENDENT_AMBULATORY_CARE_PROVIDER_SITE_OTHER): Payer: Medicare Other | Admitting: Legal Medicine

## 2021-10-11 ENCOUNTER — Encounter: Payer: Self-pay | Admitting: Legal Medicine

## 2021-10-11 VITALS — BP 180/100 | HR 64 | Temp 97.5°F | Resp 15 | Ht 63.78 in | Wt 163.0 lb

## 2021-10-11 DIAGNOSIS — I209 Angina pectoris, unspecified: Secondary | ICD-10-CM | POA: Diagnosis not present

## 2021-10-11 DIAGNOSIS — E785 Hyperlipidemia, unspecified: Secondary | ICD-10-CM | POA: Diagnosis not present

## 2021-10-11 DIAGNOSIS — K219 Gastro-esophageal reflux disease without esophagitis: Secondary | ICD-10-CM

## 2021-10-11 DIAGNOSIS — J9601 Acute respiratory failure with hypoxia: Secondary | ICD-10-CM

## 2021-10-11 DIAGNOSIS — G629 Polyneuropathy, unspecified: Secondary | ICD-10-CM | POA: Diagnosis not present

## 2021-10-11 DIAGNOSIS — I2510 Atherosclerotic heart disease of native coronary artery without angina pectoris: Secondary | ICD-10-CM

## 2021-10-11 DIAGNOSIS — I700 Atherosclerosis of aorta: Secondary | ICD-10-CM

## 2021-10-11 DIAGNOSIS — I10 Essential (primary) hypertension: Secondary | ICD-10-CM

## 2021-10-11 MED ORDER — LISINOPRIL 5 MG PO TABS: 5.0000 mg | ORAL_TABLET | Freq: Every day | ORAL | 2 refills | Status: DC

## 2021-10-11 MED ORDER — METOPROLOL SUCCINATE ER 25 MG PO TB24: 25.0000 mg | ORAL_TABLET | Freq: Every day | ORAL | 2 refills | Status: DC

## 2021-10-11 NOTE — Patient Instructions (Signed)
Restart metoprolol 25mg  for BP with lisinopril 5 mg

## 2021-10-11 NOTE — Assessment & Plan Note (Signed)
Stopped home oxygen

## 2021-10-12 LAB — CBC WITH DIFFERENTIAL/PLATELET
Basophils Absolute: 0.1 10*3/uL (ref 0.0–0.2)
Basos: 2 %
EOS (ABSOLUTE): 0.5 10*3/uL — ABNORMAL HIGH (ref 0.0–0.4)
Eos: 6 %
Hematocrit: 40.8 % (ref 37.5–51.0)
Hemoglobin: 13 g/dL (ref 13.0–17.7)
Immature Grans (Abs): 0 10*3/uL (ref 0.0–0.1)
Immature Granulocytes: 0 %
Lymphocytes Absolute: 1.5 10*3/uL (ref 0.7–3.1)
Lymphs: 18 %
MCH: 27.6 pg (ref 26.6–33.0)
MCHC: 31.9 g/dL (ref 31.5–35.7)
MCV: 87 fL (ref 79–97)
Monocytes Absolute: 0.5 10*3/uL (ref 0.1–0.9)
Monocytes: 5 %
Neutrophils Absolute: 6 10*3/uL (ref 1.4–7.0)
Neutrophils: 69 %
Platelets: 185 10*3/uL (ref 150–450)
RBC: 4.71 x10E6/uL (ref 4.14–5.80)
RDW: 15.5 % — ABNORMAL HIGH (ref 11.6–15.4)
WBC: 8.7 10*3/uL (ref 3.4–10.8)

## 2021-10-12 LAB — COMPREHENSIVE METABOLIC PANEL
ALT: 11 IU/L (ref 0–44)
AST: 21 IU/L (ref 0–40)
Albumin/Globulin Ratio: 2.2 (ref 1.2–2.2)
Albumin: 4.4 g/dL (ref 3.7–4.7)
Alkaline Phosphatase: 85 IU/L (ref 44–121)
BUN/Creatinine Ratio: 14 (ref 10–24)
BUN: 13 mg/dL (ref 8–27)
Bilirubin Total: 0.6 mg/dL (ref 0.0–1.2)
CO2: 22 mmol/L (ref 20–29)
Calcium: 9.6 mg/dL (ref 8.6–10.2)
Chloride: 106 mmol/L (ref 96–106)
Creatinine, Ser: 0.92 mg/dL (ref 0.76–1.27)
Globulin, Total: 2 g/dL (ref 1.5–4.5)
Glucose: 101 mg/dL — ABNORMAL HIGH (ref 70–99)
Potassium: 4.1 mmol/L (ref 3.5–5.2)
Sodium: 142 mmol/L (ref 134–144)
Total Protein: 6.4 g/dL (ref 6.0–8.5)
eGFR: 80 mL/min/{1.73_m2} (ref >59)

## 2021-10-12 LAB — LIPID PANEL
Chol/HDL Ratio: 2.9 ratio (ref 0.0–5.0)
Cholesterol, Total: 126 mg/dL (ref 100–199)
HDL: 43 mg/dL (ref >39)
LDL Chol Calc (NIH): 58 mg/dL (ref 0–99)
Triglycerides: 147 mg/dL (ref 0–149)
VLDL Cholesterol Cal: 25 mg/dL (ref 5–40)

## 2021-10-12 LAB — CARDIOVASCULAR RISK ASSESSMENT

## 2021-10-12 NOTE — Progress Notes (Signed)
Glucose 101, kidney and liver tests normal, CBC normal, cholesterol normal,  lp

## 2021-10-16 ENCOUNTER — Ambulatory Visit: Payer: Medicare Other | Admitting: Cardiology

## 2021-10-24 ENCOUNTER — Ambulatory Visit (INDEPENDENT_AMBULATORY_CARE_PROVIDER_SITE_OTHER): Payer: Medicare Other | Admitting: Cardiology

## 2021-10-24 ENCOUNTER — Encounter: Payer: Self-pay | Admitting: Cardiology

## 2021-10-24 VITALS — BP 160/78 | HR 51 | Ht 65.0 in | Wt 167.0 lb

## 2021-10-24 DIAGNOSIS — R0989 Other specified symptoms and signs involving the circulatory and respiratory systems: Secondary | ICD-10-CM | POA: Diagnosis not present

## 2021-10-24 DIAGNOSIS — Z951 Presence of aortocoronary bypass graft: Secondary | ICD-10-CM

## 2021-10-24 DIAGNOSIS — Z952 Presence of prosthetic heart valve: Secondary | ICD-10-CM

## 2021-10-24 DIAGNOSIS — I2510 Atherosclerotic heart disease of native coronary artery without angina pectoris: Secondary | ICD-10-CM | POA: Diagnosis not present

## 2021-10-24 DIAGNOSIS — I10 Essential (primary) hypertension: Secondary | ICD-10-CM

## 2021-10-24 NOTE — Patient Instructions (Signed)
Medication Instructions:  Your physician recommends that you continue on your current medications as directed. Please refer to the Current Medication list given to you today.  *If you need a refill on your cardiac medications before your next appointment, please call your pharmacy*   Lab Work: NONE If you have labs (blood work) drawn today and your tests are completely normal, you will receive your results only by: MyChart Message (if you have MyChart) OR A paper copy in the mail If you have any lab test that is abnormal or we need to change your treatment, we will call you to review the results.   Testing/Procedures: Your physician has requested that you have an echocardiogram. Echocardiography is a painless test that uses sound waves to create images of your heart. It provides your doctor with information about the size and shape of your heart and how well your heart's chambers and valves are working. This procedure takes approximately one hour. There are no restrictions for this procedure.   Your physician has requested that you have a carotid duplex. This test is an ultrasound of the carotid arteries in your neck. It looks at blood flow through these arteries that supply the brain with blood. Allow one hour for this exam. There are no restrictions or special instructions.    Follow-Up: At [facility], you and your health needs are our priority.  As part of our continuing mission to provide you with exceptional heart care, we have created designated Provider Care Teams.  These Care Teams include your primary Cardiologist (physician) and Advanced Practice Providers (APPs -  Physician Assistants and Nurse Practitioners) who all work together to provide you with the care you need, when you need it.  We recommend signing up for the patient portal called "MyChart".  Sign up information is provided on this After Visit Summary.  MyChart is used to connect with patients for Virtual Visits  (Telemedicine).  Patients are able to view lab/test results, encounter notes, upcoming appointments, etc.  Non-urgent messages can be sent to your provider as well.   To learn more about what you can do with MyChart, go to [email or web address].    Your next appointment:   6 month(s)  The format for your next appointment:   In Person  Provider:   Robert Krasowski, MD    Other Instructions   Important Information About Sugar       

## 2021-10-24 NOTE — Progress Notes (Unsigned)
Cardiology Office Note:    Date:  10/24/2021   ID:  Jack Hodges, DOB Sep 02, 1930, MRN [id number]  PCP:  Lillard Anes, MD  Cardiologist:  Jenne Campus, MD    Referring MD: Lillard Anes,*   Chief Complaint  Patient presents with   congestive sx's since covid    BP fluctuation    History of Present Illness:    Jack Hodges is a 88 y.o. male  with past medical history significant for coronary artery disease, in 1996 he required coronary artery bypass graft.  Also in summer 2020 when he got cardiac catheterization done he underwent PTCA and stenting of left anterior descending artery in view of the fact that he got completely occluded LIMA going to [facility].  That cardiac catheterization was done in preparation for TAVI that eventually ended up having done on 10/27/2019.  He received Edwards sapiens 3 26 mm valve.  He also got history of bilateral foot drop, essential hypertension, dyslipidemia. He comes today 2 months for follow-up.  Overall he said he is doing fine but he is complaining that he is getting old it more easy for him to get tired exhausted.  Denies having a specific complaint when I mean by that there is no chest pain tightness squeezing pressure burning chest no TIA/CVA-like symptoms.  He does have bilateral foot drop which is a chronic problem.  Past Medical History:  Diagnosis Date   Acute bursitis of left shoulder 01/04/2020   Acute respiratory failure with hypoxia (HCC) 03/17/2020   Angina pectoris ([place]) 10/07/2019   Arthritis    Atherosclerosis of aorta ([place]) 09/24/2019   Bilateral foot-drop 01/18/2017   BMI 27.0-27.9,adult 09/24/2019   Cellulitis of left hand 07/07/2019   Coronary artery disease involving native coronary artery of native heart without angina pectoris 11/01/2015   Overview:  Bypass surgery in 1996 Overview:  Overview:  Bypass surgery in [place]:  Continue his current regimen.  Follow up as noted.   CVA (cerebral  vascular accident) ([place]) 04/03/2016   Last Assessment & Plan:  The patient underwent extensive CVA workup.  MRI is negative.  He does have aortic stenosis by echocardiogram.  Upon review of Care everywhere he is followed by Dr. Agustin Cree, cardiologist for this.  Carotid ultrasound negative for hemodynamically significant stenosis.  Lipid panel within normal limits.  He has not had any further episodes of dizziness or nausea vomiting.    Dementia ([place])    Depression    Dizziness 04/19/2016   DNR (do not resuscitate) 04/03/2016   Overview:  I had an extensive discussion with the patient on 04/03/2016 regarding his end of life wishes.  He and his daughter agree that do not resuscitate is in keeping with his beliefs.   Dyslipidemia 11/01/2015   Last Assessment & Plan:  Formatting of this note might be different from the original. Cholesterol 136   Triglycerides  83  HDL  50.1  LDL Calculated  69  VLDL Cholesterol Cal  17   Continue statin.   Dyspnea    with activity   Dyspnea on exertion 06/30/2018   Essential hypertension 11/01/2015   Last Assessment & Plan:  Resume home medications.  Follow-up as noted above.   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   Thrombocytopenia ([place]) 03/13/2020    Past Surgical History:  Procedure  Laterality Date   CARDIAC CATHETERIZATION     CARDIAC SURGERY     CATARACT EXTRACTION, BILATERAL     CORONARY ARTERY BYPASS GRAFT     CORONARY STENT INTERVENTION N/A 10/07/2019   Procedure: CORONARY STENT INTERVENTION;  Surgeon: Sherren Mocha, MD;  Location: [facility];  Service: Cardiovascular;  Laterality: N/A;   EYE SURGERY     HEMORROIDECTOMY     RIGHT/LEFT HEART CATH AND CORONARY/GRAFT ANGIOGRAPHY N/A 10/07/2019   Procedure: RIGHT/LEFT HEART CATH AND CORONARY/GRAFT ANGIOGRAPHY;  Surgeon: Sherren Mocha, MD;  Location: [facility];  Service: Cardiovascular;  Laterality: N/A;   TEE WITHOUT CARDIOVERSION N/A 10/27/2019   Procedure: TRANSESOPHAGEAL ECHOCARDIOGRAM (TEE);  Surgeon: Burnell Blanks, MD;  Location: [facility];  Service: Open Heart Surgery;  Laterality: N/A;   TRANSCATHETER AORTIC VALVE REPLACEMENT, TRANSFEMORAL N/A 10/27/2019   Procedure: TRANSCATHETER AORTIC VALVE REPLACEMENT, TRANSFEMORAL;  Surgeon: Burnell Blanks, MD;  Location: [facility];  Service: Open Heart Surgery;  Laterality: N/A;    Current Medications: Current Meds  Medication Sig   acetaminophen (TYLENOL) 650 MG CR tablet Take 650 mg by mouth every 8 (eight) hours as needed for pain.    albuterol (VENTOLIN HFA) 108 (90 Base) MCG/ACT inhaler Inhale 2 puffs into the lungs every 2 (two) hours as needed for wheezing or shortness of breath.   ascorbic acid (VITAMIN C) 500 MG tablet Take 1 tablet (500 mg total) by mouth daily.   aspirin EC 81 MG tablet Take 81 mg by mouth daily.   cholecalciferol (VITAMIN D) 25 MCG (1000 UNIT) tablet Take 1,000 Units by mouth daily.   ezetimibe (ZETIA) 10 MG tablet TAKE 1 TABLET BY MOUTH  DAILY (Patient taking differently: Take 10 mg by mouth daily.)   fluorometholone (FML) 0.1 % ophthalmic suspension Place 1 drop into both eyes daily.   furosemide (LASIX) 20 MG tablet Take 1 tablet (20 mg total) by mouth daily.   gabapentin (NEURONTIN) 300  MG capsule Take 1 capsule (300 mg total) by mouth 3 (three) times daily.   lisinopril (ZESTRIL) 5 MG tablet Take 1 tablet (5 mg total) by mouth daily.   metoprolol succinate (TOPROL XL) 25 MG 24 hr tablet Take 1 tablet (25 mg total) by mouth daily.   nitroGLYCERIN (NITROSTAT) 0.4 MG SL tablet Place 1 tablet (0.4 mg total) under the tongue every 5 (five) minutes as needed for chest pain.   pantoprazole (PROTONIX) 40 MG tablet TAKE 1 TABLET BY MOUTH DAILY (Patient taking differently: Take 40 mg by mouth daily.)   rosuvastatin (CRESTOR) 20 MG tablet Take 1 tablet (20 mg total) by mouth at bedtime.   vitamin B-12 (CYANOCOBALAMIN) 500 MCG tablet Take 500 mcg by mouth daily.   zinc sulfate 220 (50 Zn) MG capsule Take 1 capsule (220 mg total) by mouth daily.     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  He has one son that lives with him and one daughter that lives beside of him. He is close with his family.  His sister cooks meals for him regularly.  He has friends at the cafe he visits regularly.   Social Determinants of Health   Financial Resource Strain: Low Risk  (09/15/2021)   Overall Financial Resource Strain (CARDIA)    Difficulty of Paying Living Expenses: Not hard at all  Food Insecurity: No Food Insecurity  (07/13/2020)   Hunger Vital Sign    Worried About Running Out of Food in the Last Year: Never true    Ran Out of Food in the Last Year: Never true  Transportation Needs: No Transportation Needs (09/15/2021)   PRAPARE - Administrator, Civil Service (Medical): No    Lack of Transportation (Non-Medical): No  Physical Activity: Insufficiently Active (07/13/2020)   Exercise Vital Sign    Days of Exercise per Week: 2 days    Minutes of Exercise per Session: 10 min  Stress: No Stress Concern Present (07/13/2020)   [employer] of Occupational Health - Occupational Stress Questionnaire    Feeling of Stress : Not at all  Social Connections: Moderately Isolated (07/13/2020)   Social Connection and Isolation Panel [NHANES]    Frequency of Communication with Friends and Family: More than three times a week    Frequency of Social Gatherings with Friends and Family: More than three times a week    Attends Religious Services: 1 to 4 times per year    Active Member of [employer] or Organizations: No    Attends Banker Meetings: Never    Marital Status: Widowed     Family History: The patient's family history includes Arthritis in his brother and father; Cancer in his brother and mother; Diabetes in his mother; Hypertension in his father; Kidney disease in his mother; Migraines in his father; Sleep disorder in his father and mother; Stroke in his mother. ROS:   Please see the history of present illness.    All 14 point review of systems negative except as described per history of present illness  EKGs/Labs/Other Studies Reviewed:      Recent Labs: 10/11/2021: ALT 11; BUN 13; Creatinine, Ser 0.92; Hemoglobin 13.0; Platelets 185; Potassium 4.1; Sodium 142  Recent Lipid Panel    Component Value Date/Time   CHOL 126 10/11/2021 0916   TRIG 147 10/11/2021 0916   HDL 43 10/11/2021 0916   CHOLHDL 2.9 10/11/2021 0916   LDLCALC 58 10/11/2021 0916    Physical Exam:    VS:  BP  (!) 160/78 (BP Location: Left Arm)   Pulse Marland Kitchen)  51   Ht 5\' 5"  (1.651 m)   Wt 167 lb (75.8 kg)   SpO2 96%   BMI 27.79 kg/m     Wt Readings from Last 3 Encounters:  10/24/21 167 lb (75.8 kg)  10/11/21 163 lb (73.9 kg)  08/01/21 166 lb (75.3 kg)     GEN:  Well nourished, well developed in no acute distress HEENT: Normal NECK: No JVD; bilateral carotid bruits LYMPHATICS: No lymphadenopathy CARDIAC: RRR, systolic ejection murmur grade 1/6 to 2/6 best heard right upper portion of the sternum, no rubs, no gallops RESPIRATORY:  Clear to auscultation without rales, wheezing or rhonchi  ABDOMEN: Soft, non-tender, non-distended MUSCULOSKELETAL:  No edema; No deformity  SKIN: Warm and dry LOWER EXTREMITIES: no swelling NEUROLOGIC:  Alert and oriented x 3 PSYCHIATRIC:  Normal affect   ASSESSMENT:    1. Coronary artery disease involving native coronary artery of native heart without angina pectoris   2. Essential hypertension   3. Status post coronary artery bypass graft   4. S/P TAVR (transcatheter aortic valve replacement)    PLAN:    In order of problems listed above:  Coronary artery disease stable from that point review denies have any chest pain tightness squeezing pressure burning chest. Essential hypertension which appears to be difficult to control.  Today blood pressure 160/78.  We will recheck in before he leaves the room.  Asking also to check blood pressure on the regular basis and bring her results to me within next few days. Status post IV will ask him to have an echocardiogram done Bilateral carotid bruit: Cardiac ultrasounds will be performed Dyslipidemia I did review K PN I have dated from 10/11/2021 with LDL of 58 HDL 43 good cholesterol profile   Medication Adjustments/Labs and Tests Ordered: Current medicines are reviewed at length with the patient today.  Concerns regarding medicines are outlined above.  No orders of the defined types were placed in this  encounter.  Medication changes: No orders of the defined types were placed in this encounter.   Signed, 10/13/2021, MD, [facility] 10/24/2021 11:37 AM    [facility]

## 2021-10-25 ENCOUNTER — Ambulatory Visit: Payer: Medicare Other

## 2021-10-25 VITALS — BP 172/64 | HR 53

## 2021-10-25 DIAGNOSIS — G458 Other transient cerebral ischemic attacks and related syndromes: Secondary | ICD-10-CM

## 2021-10-25 DIAGNOSIS — I10 Essential (primary) hypertension: Secondary | ICD-10-CM

## 2021-10-25 NOTE — Progress Notes (Unsigned)
Patient came today to recheck BP. His blood pressure on left arm was 82/52 by Sabino Donovan. I checked and his BP was left arm 100/60 pulse 53; and right arm 170/70. Five minutes later his blood pressure was Left arm 106/52 and right arm was 172/64. He did not have any chest pain, SOB, Headache, dizziness. He went to see Dr Kirtland Bouchard yesterday and patient stated that everything was good with cardiology.  I talked with Dr Marina Goodell and he recommended to refer him as soon as possible to Vascular Surgery. Order for referral was putting in. Patient was told about the referral. If he has any chest pain, SOB.

## 2021-10-26 ENCOUNTER — Other Ambulatory Visit: Payer: Self-pay | Admitting: Family Medicine

## 2021-10-26 ENCOUNTER — Other Ambulatory Visit: Payer: Self-pay | Admitting: Legal Medicine

## 2021-10-26 ENCOUNTER — Telehealth: Payer: Self-pay

## 2021-10-26 MED ORDER — PREGABALIN 75 MG PO CAPS: 75.0000 mg | ORAL_CAPSULE | Freq: Three times a day (TID) | ORAL | 0 refills | Status: DC

## 2021-10-26 NOTE — Progress Notes (Signed)
Chronic Care Management Pharmacy Assistant   Name: Jack Hodges  MRN: [id number] DOB: 05/05/1930   Reason for Encounter: Disease State call for HTN   Recent office visits:  10/25/21 Dr. Marina Goodell. Seen for BP check. Referral to Vascular Surgery.Left arm: 106/52,  82/52 , 100/60 , right arm: 170/70,  172/64.  10/11/21 Brent Bulla MD. Seen for CAD. Restart metoprolol 25mg  for BP with lisinopril 5 mg  Recent consult visits:  10/24/21 (Cardiology) 12/24/21 MD. Seen for CAD. No med changes.   09/21/21 (Cardiology) 09/23/21 MD.. Telephone Message. Reduce lisinopril to 2.5 mg daily.  Hospital visits:  None  Medications: Outpatient Encounter Medications as of 10/26/2021  Medication Sig   acetaminophen (TYLENOL) 650 MG CR tablet Take 650 mg by mouth every 8 (eight) hours as needed for pain.    albuterol (VENTOLIN HFA) 108 (90 Base) MCG/ACT inhaler Inhale 2 puffs into the lungs every 2 (two) hours as needed for wheezing or shortness of breath.   ascorbic acid (VITAMIN C) 500 MG tablet Take 1 tablet (500 mg total) by mouth daily.   aspirin EC 81 MG tablet Take 81 mg by mouth daily.   cholecalciferol (VITAMIN D) 25 MCG (1000 UNIT) tablet Take 1,000 Units by mouth daily.   ezetimibe (ZETIA) 10 MG tablet TAKE 1 TABLET BY MOUTH  DAILY (Patient taking differently: Take 10 mg by mouth daily.)   fluorometholone (FML) 0.1 % ophthalmic suspension Place 1 drop into both eyes daily.   furosemide (LASIX) 20 MG tablet Take 1 tablet (20 mg total) by mouth daily.   gabapentin (NEURONTIN) 300 MG capsule Take 1 capsule (300 mg total) by mouth 3 (three) times daily.   lisinopril (ZESTRIL) 5 MG tablet Take 1 tablet (5 mg total) by mouth daily.   metoprolol succinate (TOPROL XL) 25 MG 24 hr tablet Take 1 tablet (25 mg total) by mouth daily.   nitroGLYCERIN (NITROSTAT) 0.4 MG SL tablet Place 1 tablet (0.4 mg total) under the tongue every 5 (five) minutes as needed for chest pain.   pantoprazole  (PROTONIX) 40 MG tablet TAKE 1 TABLET BY MOUTH DAILY (Patient taking differently: Take 40 mg by mouth daily.)   rosuvastatin (CRESTOR) 20 MG tablet Take 1 tablet (20 mg total) by mouth at bedtime.   vitamin B-12 (CYANOCOBALAMIN) 500 MCG tablet Take 500 mcg by mouth daily.   zinc sulfate 220 (50 Zn) MG capsule Take 1 capsule (220 mg total) by mouth daily.   No facility-administered encounter medications on file as of 10/26/2021.     Recent Office Vitals: BP Readings from Last 3 Encounters:  10/25/21 (!) 172/64  10/24/21 (!) 160/78  10/11/21 (!) 180/100   Pulse Readings from Last 3 Encounters:  10/25/21 (!) 53  10/24/21 (!) 51  10/11/21 64    Wt Readings from Last 3 Encounters:  10/24/21 167 lb (75.8 kg)  10/11/21 163 lb (73.9 kg)  08/01/21 166 lb (75.3 kg)     Kidney Function Lab Results  Component Value Date/Time   CREATININE 0.92 10/11/2021 09:16 AM   CREATININE 0.86 07/27/2021 10:05 AM   GFRNONAA 78 05/09/2020 08:26 AM   GFRNONAA >60 03/17/2020 12:44 AM   GFRAA 90 05/09/2020 08:26 AM       Latest Ref Rng & Units 10/11/2021    9:16 AM 07/27/2021   10:05 AM 05/03/2021   10:25 AM  BMP  Glucose 70 - 99 mg/dL 07/01/2021  83  595   BUN 8 - 27  mg/dL 13  11  11    Creatinine 0.76 - 1.27 mg/dL  5.85  2.77   BUN/Creat Ratio 10 - 24 14  13  13    Sodium 134 - 144 mmol/L 142  146  141   Potassium 3.5 - 5.2 mmol/L 4.1  4.0  4.0   Chloride 96 - 106 mmol/L 106  109  105   CO2 20 - 29 mmol/L 22  22  22    Calcium 8.6 - 10.2 mg/dL 9.6  9.1  9.4      Current antihypertensive regimen:  Lisinopril 5mg  daily Metoprolol Succinate 25mg  daily ( Pt is not taking currently due to lower BP) Patient verbally confirms he is taking the above medications as directed. Yes  How often are you checking your Blood Pressure? daily  he checks his blood pressure in the morning before taking his medication.  Current home BP readings:  10/26/21 126/57 in left arm  10/25/21 Left arm: 106/52,  82/52 , 100/60  , right arm: 170/70,  172/64.  Wrist or arm cuff:Arm  Caffeine intake:3 cups of coffee  Salt intake:Limited  OTC medications including pseudoephedrine or NSAIDs?  Any readings above 180/120? Yes on 10/11/21 180/100 at the doctors office. Denied any dizziness and chest pain   What recent interventions/DTPs have been made by any provider to improve Blood Pressure control since last CPP Visit: No changes   Any recent hospitalizations or ED visits since last visit with CPP? No  What diet changes have been made to improve Blood Pressure Control?  Pt stated he doesn't eat much and drinks a lot of ensures   What exercise is being done to improve your Blood Pressure Control?  Pt does ROM exercises in bed.   Adherence Review: Is the patient currently on ACE/ARB medication? Yes Does the patient have >5 day gap between last estimated fill dates? CPP to review  Care Gaps: Last annual wellness visit? None noted   Star Rating Drugs:  Medication:  Last Fill: Day Supply  Lisinopril   10/11/21 90ds-09/08/21 30ds   12/26/21, CMA Clinical Pharmacist Assistant  [phone]

## 2021-11-08 ENCOUNTER — Ambulatory Visit (INDEPENDENT_AMBULATORY_CARE_PROVIDER_SITE_OTHER): Payer: Medicare Other

## 2021-11-08 DIAGNOSIS — R0989 Other specified symptoms and signs involving the circulatory and respiratory systems: Secondary | ICD-10-CM

## 2021-11-08 DIAGNOSIS — I2510 Atherosclerotic heart disease of native coronary artery without angina pectoris: Secondary | ICD-10-CM | POA: Diagnosis not present

## 2021-11-08 DIAGNOSIS — Z952 Presence of prosthetic heart valve: Secondary | ICD-10-CM | POA: Diagnosis not present

## 2021-11-08 DIAGNOSIS — Z951 Presence of aortocoronary bypass graft: Secondary | ICD-10-CM | POA: Diagnosis not present

## 2021-11-08 DIAGNOSIS — I10 Essential (primary) hypertension: Secondary | ICD-10-CM | POA: Diagnosis not present

## 2021-11-08 LAB — ECHOCARDIOGRAM COMPLETE
AR max vel: 1.33 cm2
AV Area VTI: 1.45 cm2
AV Area mean vel: 1.4 cm2
AV Mean grad: 5 mmHg
AV Peak grad: 9.7 mmHg
Ao pk vel: 1.56 m/s
Area-P 1/2: 4.6 cm2
Calc EF: 44.7 %
S' Lateral: 3.4 cm
Single Plane A2C EF: 41.6 %
Single Plane A4C EF: 52.2 %

## 2021-11-13 ENCOUNTER — Telehealth: Payer: Self-pay

## 2021-11-13 DIAGNOSIS — Z79899 Other long term (current) drug therapy: Secondary | ICD-10-CM | POA: Diagnosis not present

## 2021-11-13 DIAGNOSIS — Z87891 Personal history of nicotine dependence: Secondary | ICD-10-CM | POA: Diagnosis not present

## 2021-11-13 DIAGNOSIS — I10 Essential (primary) hypertension: Secondary | ICD-10-CM | POA: Diagnosis not present

## 2021-11-13 DIAGNOSIS — I110 Hypertensive heart disease with heart failure: Secondary | ICD-10-CM | POA: Diagnosis not present

## 2021-11-13 DIAGNOSIS — I5033 Acute on chronic diastolic (congestive) heart failure: Secondary | ICD-10-CM

## 2021-11-13 DIAGNOSIS — R079 Chest pain, unspecified: Secondary | ICD-10-CM | POA: Diagnosis not present

## 2021-11-13 DIAGNOSIS — Z743 Need for continuous supervision: Secondary | ICD-10-CM | POA: Diagnosis not present

## 2021-11-13 DIAGNOSIS — F32A Depression, unspecified: Secondary | ICD-10-CM | POA: Diagnosis not present

## 2021-11-13 DIAGNOSIS — Z952 Presence of prosthetic heart valve: Secondary | ICD-10-CM | POA: Diagnosis not present

## 2021-11-13 DIAGNOSIS — Z951 Presence of aortocoronary bypass graft: Secondary | ICD-10-CM | POA: Diagnosis not present

## 2021-11-13 DIAGNOSIS — Z791 Long term (current) use of non-steroidal anti-inflammatories (NSAID): Secondary | ICD-10-CM | POA: Diagnosis not present

## 2021-11-13 DIAGNOSIS — I2510 Atherosclerotic heart disease of native coronary artery without angina pectoris: Secondary | ICD-10-CM | POA: Diagnosis not present

## 2021-11-13 DIAGNOSIS — I517 Cardiomegaly: Secondary | ICD-10-CM | POA: Diagnosis not present

## 2021-11-13 DIAGNOSIS — R279 Unspecified lack of coordination: Secondary | ICD-10-CM | POA: Diagnosis not present

## 2021-11-13 DIAGNOSIS — Z7982 Long term (current) use of aspirin: Secondary | ICD-10-CM | POA: Diagnosis not present

## 2021-11-13 DIAGNOSIS — R519 Headache, unspecified: Secondary | ICD-10-CM | POA: Diagnosis not present

## 2021-11-13 DIAGNOSIS — I248 Other forms of acute ischemic heart disease: Secondary | ICD-10-CM | POA: Diagnosis not present

## 2021-11-13 DIAGNOSIS — I5023 Acute on chronic systolic (congestive) heart failure: Secondary | ICD-10-CM | POA: Diagnosis not present

## 2021-11-13 DIAGNOSIS — G629 Polyneuropathy, unspecified: Secondary | ICD-10-CM | POA: Diagnosis not present

## 2021-11-13 DIAGNOSIS — E785 Hyperlipidemia, unspecified: Secondary | ICD-10-CM | POA: Diagnosis not present

## 2021-11-13 DIAGNOSIS — Z8673 Personal history of transient ischemic attack (TIA), and cerebral infarction without residual deficits: Secondary | ICD-10-CM | POA: Diagnosis not present

## 2021-11-13 DIAGNOSIS — Z955 Presence of coronary angioplasty implant and graft: Secondary | ICD-10-CM | POA: Diagnosis not present

## 2021-11-13 DIAGNOSIS — I509 Heart failure, unspecified: Secondary | ICD-10-CM | POA: Diagnosis not present

## 2021-11-13 HISTORY — DX: Acute on chronic diastolic (congestive) heart failure: I50.33

## 2021-11-13 NOTE — Telephone Encounter (Signed)
Spoke with pt. About test results. Pt stated that he was in the hospital. He said he was short of breath and his legs were swollen. Reviewed ECHO and Carotid US. Pt verbalized understanding and had no further questions.

## 2021-11-13 NOTE — Telephone Encounter (Signed)
Agree. Dr. Manolito Jurewicz  

## 2021-11-13 NOTE — Telephone Encounter (Signed)
FYI. Patient called stated that he is shortness of breath, edema in both feet, very tired, lack of energy and he did not feel right. I recommended to go to ED as soon as possible. He verbalized to be agreed because something was not right. He will go with his son to [facility].

## 2021-11-14 ENCOUNTER — Telehealth: Payer: Self-pay | Admitting: Cardiology

## 2021-11-14 ENCOUNTER — Other Ambulatory Visit: Payer: Self-pay

## 2021-11-14 DIAGNOSIS — E785 Hyperlipidemia, unspecified: Secondary | ICD-10-CM | POA: Diagnosis not present

## 2021-11-14 DIAGNOSIS — Z951 Presence of aortocoronary bypass graft: Secondary | ICD-10-CM | POA: Diagnosis not present

## 2021-11-14 DIAGNOSIS — R519 Headache, unspecified: Secondary | ICD-10-CM | POA: Diagnosis not present

## 2021-11-14 DIAGNOSIS — I248 Other forms of acute ischemic heart disease: Secondary | ICD-10-CM | POA: Diagnosis not present

## 2021-11-14 DIAGNOSIS — I2510 Atherosclerotic heart disease of native coronary artery without angina pectoris: Secondary | ICD-10-CM | POA: Diagnosis not present

## 2021-11-14 DIAGNOSIS — Z8673 Personal history of transient ischemic attack (TIA), and cerebral infarction without residual deficits: Secondary | ICD-10-CM | POA: Insufficient documentation

## 2021-11-14 DIAGNOSIS — Z791 Long term (current) use of non-steroidal anti-inflammatories (NSAID): Secondary | ICD-10-CM | POA: Diagnosis not present

## 2021-11-14 DIAGNOSIS — Z7982 Long term (current) use of aspirin: Secondary | ICD-10-CM | POA: Diagnosis not present

## 2021-11-14 DIAGNOSIS — G458 Other transient cerebral ischemic attacks and related syndromes: Secondary | ICD-10-CM

## 2021-11-14 DIAGNOSIS — Z87891 Personal history of nicotine dependence: Secondary | ICD-10-CM | POA: Diagnosis not present

## 2021-11-14 DIAGNOSIS — F32A Depression, unspecified: Secondary | ICD-10-CM | POA: Diagnosis not present

## 2021-11-14 DIAGNOSIS — Z955 Presence of coronary angioplasty implant and graft: Secondary | ICD-10-CM | POA: Diagnosis not present

## 2021-11-14 DIAGNOSIS — Z952 Presence of prosthetic heart valve: Secondary | ICD-10-CM | POA: Diagnosis not present

## 2021-11-14 DIAGNOSIS — I5033 Acute on chronic diastolic (congestive) heart failure: Secondary | ICD-10-CM | POA: Diagnosis not present

## 2021-11-14 DIAGNOSIS — I110 Hypertensive heart disease with heart failure: Secondary | ICD-10-CM | POA: Diagnosis not present

## 2021-11-14 DIAGNOSIS — I10 Essential (primary) hypertension: Secondary | ICD-10-CM | POA: Diagnosis not present

## 2021-11-14 DIAGNOSIS — I2489 Other forms of acute ischemic heart disease: Secondary | ICD-10-CM | POA: Insufficient documentation

## 2021-11-14 DIAGNOSIS — Z79899 Other long term (current) drug therapy: Secondary | ICD-10-CM | POA: Diagnosis not present

## 2021-11-14 DIAGNOSIS — G629 Polyneuropathy, unspecified: Secondary | ICD-10-CM

## 2021-11-14 HISTORY — DX: Personal history of transient ischemic attack (TIA), and cerebral infarction without residual deficits: Z86.73

## 2021-11-14 HISTORY — DX: Other forms of acute ischemic heart disease: I24.89

## 2021-11-14 HISTORY — DX: Polyneuropathy, unspecified: G62.9

## 2021-11-14 NOTE — Telephone Encounter (Signed)
Pt is in the hospital in [place]. Referral was placed by PCP. Advised to call PCP regarding referral. Jack Hodges verbalized understanding and had no additional questions.

## 2021-11-14 NOTE — Telephone Encounter (Signed)
Patient's son states the patient is in hospital with fluid on his lungs. He states the patient was referred to [facility] in [place]. He would like to know if Dr. Bing Matter has any information on this and whether the patient needs to see them. She states since he is in the hospital he is not sure they can just do it at the hospital.

## 2021-11-15 DIAGNOSIS — F32A Depression, unspecified: Secondary | ICD-10-CM | POA: Diagnosis not present

## 2021-11-15 DIAGNOSIS — Z952 Presence of prosthetic heart valve: Secondary | ICD-10-CM | POA: Diagnosis not present

## 2021-11-15 DIAGNOSIS — I5033 Acute on chronic diastolic (congestive) heart failure: Secondary | ICD-10-CM | POA: Diagnosis not present

## 2021-11-15 DIAGNOSIS — Z7982 Long term (current) use of aspirin: Secondary | ICD-10-CM | POA: Diagnosis not present

## 2021-11-15 DIAGNOSIS — Z955 Presence of coronary angioplasty implant and graft: Secondary | ICD-10-CM | POA: Diagnosis not present

## 2021-11-15 DIAGNOSIS — Z79899 Other long term (current) drug therapy: Secondary | ICD-10-CM | POA: Diagnosis not present

## 2021-11-15 DIAGNOSIS — I2510 Atherosclerotic heart disease of native coronary artery without angina pectoris: Secondary | ICD-10-CM | POA: Diagnosis not present

## 2021-11-15 DIAGNOSIS — G629 Polyneuropathy, unspecified: Secondary | ICD-10-CM | POA: Diagnosis not present

## 2021-11-15 DIAGNOSIS — Z87891 Personal history of nicotine dependence: Secondary | ICD-10-CM | POA: Diagnosis not present

## 2021-11-15 DIAGNOSIS — Z8673 Personal history of transient ischemic attack (TIA), and cerebral infarction without residual deficits: Secondary | ICD-10-CM | POA: Diagnosis not present

## 2021-11-15 DIAGNOSIS — I110 Hypertensive heart disease with heart failure: Secondary | ICD-10-CM | POA: Diagnosis not present

## 2021-11-15 DIAGNOSIS — R519 Headache, unspecified: Secondary | ICD-10-CM | POA: Diagnosis not present

## 2021-11-15 DIAGNOSIS — E785 Hyperlipidemia, unspecified: Secondary | ICD-10-CM | POA: Diagnosis not present

## 2021-11-15 DIAGNOSIS — Z791 Long term (current) use of non-steroidal anti-inflammatories (NSAID): Secondary | ICD-10-CM | POA: Diagnosis not present

## 2021-11-15 DIAGNOSIS — Z951 Presence of aortocoronary bypass graft: Secondary | ICD-10-CM | POA: Diagnosis not present

## 2021-11-15 DIAGNOSIS — I248 Other forms of acute ischemic heart disease: Secondary | ICD-10-CM | POA: Diagnosis not present

## 2021-11-17 ENCOUNTER — Other Ambulatory Visit: Payer: Self-pay | Admitting: Legal Medicine

## 2021-11-17 DIAGNOSIS — F32A Depression, unspecified: Secondary | ICD-10-CM

## 2021-11-30 DIAGNOSIS — I5033 Acute on chronic diastolic (congestive) heart failure: Secondary | ICD-10-CM | POA: Diagnosis not present

## 2021-12-01 ENCOUNTER — Ambulatory Visit (INDEPENDENT_AMBULATORY_CARE_PROVIDER_SITE_OTHER): Payer: Medicare Other | Admitting: Family Medicine

## 2021-12-01 ENCOUNTER — Telehealth: Payer: Self-pay

## 2021-12-01 ENCOUNTER — Encounter: Payer: Self-pay | Admitting: Family Medicine

## 2021-12-01 VITALS — BP 128/72 | HR 68 | Temp 98.4°F | Ht 65.0 in | Wt 167.0 lb

## 2021-12-01 DIAGNOSIS — I2510 Atherosclerotic heart disease of native coronary artery without angina pectoris: Secondary | ICD-10-CM

## 2021-12-01 DIAGNOSIS — I10 Essential (primary) hypertension: Secondary | ICD-10-CM

## 2021-12-01 DIAGNOSIS — G2581 Restless legs syndrome: Secondary | ICD-10-CM

## 2021-12-01 DIAGNOSIS — I110 Hypertensive heart disease with heart failure: Secondary | ICD-10-CM

## 2021-12-01 MED ORDER — CARBIDOPA-LEVODOPA 25-100 MG PO TABS: 1.0000 | ORAL_TABLET | Freq: Every evening | ORAL | 1 refills | Status: DC | PRN

## 2021-12-01 NOTE — Progress Notes (Unsigned)
Subjective:  Patient ID: Jack Hodges, male    DOB: [birth year]  Age: 88 y.o. MRN: [id number]  Chief Complaint  Patient presents with   Hospitalization Follow-up    Follow up Hospitalization  Patient was admitted to [facility] on *** and discharged on ***. He was treated for ***. Treatment for this included ***. Telephone follow up was done on *** He reports {excellent/good/fair:19992} compliance with treatment. He reports this condition is {resolved/improved/worsened:23923}.    Current Outpatient Medications on File Prior to Visit  Medication Sig Dispense Refill   acetaminophen (TYLENOL) 650 MG CR tablet Take 650 mg by mouth every 8 (eight) hours as needed for pain.      albuterol (VENTOLIN HFA) 108 (90 Base) MCG/ACT inhaler Inhale 2 puffs into the lungs every 2 (two) hours as needed for wheezing or shortness of breath. 6.7 g 6   ascorbic acid (VITAMIN C) 500 MG tablet Take 1 tablet (500 mg total) by mouth daily. 30 tablet 0   aspirin EC 81 MG tablet Take 81 mg by mouth daily.     cholecalciferol (VITAMIN D) 25 MCG (1000 UNIT) tablet Take 1,000 Units by mouth daily.     ezetimibe (ZETIA) 10 MG tablet TAKE 1 TABLET BY MOUTH  DAILY (Patient taking differently: Take 10 mg by mouth daily.) 90 tablet 3   fluorometholone (FML) 0.1 % ophthalmic suspension Place 1 drop into both eyes daily.     furosemide (LASIX) 20 MG tablet Take 1 tablet (20 mg total) by mouth daily. 90 tablet 0   lisinopril (ZESTRIL) 5 MG tablet Take 1 tablet (5 mg total) by mouth daily. 90 tablet 2   metoprolol succinate (TOPROL XL) 25 MG 24 hr tablet Take 1 tablet (25 mg total) by mouth daily. 90 tablet 2   nitroGLYCERIN (NITROSTAT) 0.4 MG SL tablet Place 1 tablet (0.4 mg total) under the tongue every 5 (five) minutes as needed for chest pain. 25 tablet 2   pantoprazole (PROTONIX) 40 MG tablet TAKE 1 TABLET BY MOUTH DAILY (Patient taking differently: Take 40 mg by mouth daily.) 100 tablet 2   pregabalin  (LYRICA) 75 MG capsule Take 1 capsule (75 mg total) by mouth 3 (three) times daily. 270 capsule 0   rosuvastatin (CRESTOR) 20 MG tablet Take 1 tablet (20 mg total) by mouth at bedtime. 90 tablet 3   vitamin B-12 (CYANOCOBALAMIN) 500 MCG tablet Take 500 mcg by mouth daily.     zinc sulfate 220 (50 Zn) MG capsule Take 1 capsule (220 mg total) by mouth daily. 30 capsule 0   No current facility-administered medications on file prior to visit.   Past Medical History:  Diagnosis Date   Acute bursitis of left shoulder 01/04/2020   Acute respiratory failure with hypoxia (HCC) 03/17/2020   Angina pectoris (HCC) 10/07/2019   Arthritis    Atherosclerosis of aorta (HCC) 09/24/2019   Bilateral foot-drop 01/18/2017   BMI 27.0-27.9,adult 09/24/2019   Cellulitis of left hand 07/07/2019   Coronary artery disease involving native coronary artery of native heart without angina pectoris 11/01/2015   Overview:  Bypass surgery in 1996 Overview:  Overview:  Bypass surgery in 1996  Last Assessment & Plan:  Continue his current regimen.  Follow up as noted.   CVA (cerebral vascular accident) (HCC) 04/03/2016   Last Assessment & Plan:  The patient underwent extensive CVA workup.  MRI is negative.  He does have aortic stenosis by echocardiogram.  Upon review of Care everywhere he  is followed by Dr. Bing Matter, cardiologist for this.  Carotid ultrasound negative for hemodynamically significant stenosis.  Lipid panel within normal limits.  He has not had any further episodes of dizziness or nausea vomiting.    Dementia (HCC)    Depression    Dizziness 04/19/2016   DNR (do not resuscitate) 04/03/2016   Overview:  I had an extensive discussion with the patient on 04/03/2016 regarding his end of life wishes.  He and his daughter agree that do not resuscitate is in keeping with his beliefs.   Dyslipidemia 11/01/2015   Last Assessment & Plan:  Formatting of this note might be different from the original. Cholesterol 136   Triglycerides   83  HDL  50.1  LDL Calculated  69  VLDL Cholesterol Cal  17   Continue statin.   Dyspnea    with activity   Dyspnea on exertion 06/30/2018   Essential hypertension 11/01/2015   Last Assessment & Plan:  Resume home medications.  Follow-up as noted above.   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   Thrombocytopenia (HCC) 03/13/2020   Past Surgical History:  Procedure Laterality Date   CARDIAC CATHETERIZATION     CARDIAC SURGERY     CATARACT EXTRACTION, BILATERAL     CORONARY ARTERY BYPASS GRAFT     CORONARY STENT INTERVENTION N/A 10/07/2019   Procedure: CORONARY STENT INTERVENTION;  Surgeon: Tonny Bollman, MD;  Location: [facility] INVASIVE CV LAB;  Service: Cardiovascular;  Laterality: N/A;   EYE SURGERY     HEMORROIDECTOMY     RIGHT/LEFT HEART CATH AND CORONARY/GRAFT ANGIOGRAPHY N/A 10/07/2019   Procedure: RIGHT/LEFT HEART CATH AND CORONARY/GRAFT ANGIOGRAPHY;  Surgeon: Tonny Bollman, MD;  Location: [facility] INVASIVE CV LAB;  Service: Cardiovascular;  Laterality: N/A;   TEE WITHOUT CARDIOVERSION N/A 10/27/2019   Procedure: TRANSESOPHAGEAL ECHOCARDIOGRAM (TEE);  Surgeon: Kathleene Hazel, MD;  Location: [facility] INVASIVE CV LAB;  Service: Open Heart Surgery;  Laterality: N/A;   TRANSCATHETER AORTIC VALVE REPLACEMENT,  TRANSFEMORAL N/A 10/27/2019   Procedure: TRANSCATHETER AORTIC VALVE REPLACEMENT, TRANSFEMORAL;  Surgeon: Kathleene Hazel, MD;  Location: [facility] INVASIVE CV LAB;  Service: Open Heart Surgery;  Laterality: N/A;    Family History  Problem Relation Age of Onset   Cancer Mother    Diabetes Mother    Kidney disease Mother    Stroke Mother    Sleep disorder Mother    Arthritis Father    Migraines Father    Hypertension Father    Sleep disorder Father    Arthritis Brother    Cancer Brother    Social History   Socioeconomic History   Marital status: Widowed    Spouse name: Dorann Lodge   Number of children: 2   Years of education: Not on file   Highest education  level: Not on file  Occupational History   Occupation: Retired    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  He has one son that lives with him and one daughter that lives beside of him. He is close with his family.  His sister cooks meals for him regularly.  He has friends at the cafe he visits regularly.   Social Determinants of Health   Financial Resource Strain: Low Risk  (09/15/2021)   Overall Financial Resource Strain (CARDIA)    Difficulty of Paying Living Expenses: Not hard at all  Food Insecurity: No Food Insecurity (07/13/2020)   Hunger Vital Sign    Worried About Running Out of Food in the Last Year: Never true    Ran Out of Food in the Last Year: Never true  Transportation Needs: No Transportation Needs (09/15/2021)   PRAPARE - Administrator, Civil Service (Medical): No    Lack of  Transportation (Non-Medical): No  Physical Activity: Insufficiently Active (07/13/2020)   Exercise Vital Sign    Days of Exercise per Week: 2 days    Minutes of Exercise per Session: 10 min  Stress: No Stress Concern Present (07/13/2020)   [employer] of Occupational Health - Occupational Stress Questionnaire    Feeling of Stress : Not at all  Social Connections: Moderately Isolated (07/13/2020)   Social Connection and Isolation Panel [NHANES]    Frequency of Communication with Friends and Family: More than three times a week    Frequency of Social Gatherings with Friends and Family: More than three times a week    Attends Religious Services: 1 to 4 times per year    Active Member of [employer] or Organizations: No    Attends Banker Meetings: Never    Marital Status: Widowed    Review of Systems  Constitutional:  Negative for appetite change, fatigue and fever.  HENT:  Negative for congestion, ear pain, sinus pressure and sore throat.   Respiratory:  Negative for cough, chest tightness, shortness of breath and wheezing.   Cardiovascular:  Negative for chest pain and palpitations.  Gastrointestinal:  Negative for abdominal pain, constipation, diarrhea, nausea and vomiting.  Genitourinary:  Negative for dysuria and hematuria.  Musculoskeletal:  Negative for arthralgias, back pain, joint swelling and myalgias.  Skin:  Negative for rash.  Neurological:  Negative for dizziness, weakness and headaches.  Psychiatric/Behavioral:  Negative for dysphoric mood. The patient is not nervous/anxious.      Objective:  BP 128/72 (BP Location: Left Arm, Patient Position: Sitting)   Pulse 68   Temp 98.4 F (36.9 C) (Temporal)   Ht 5\' 5"  (1.651 m)   Wt 167 lb (75.8 kg)   SpO2 98%   BMI 27.79 kg/m      12/01/2021   11:48 AM 10/25/2021   10:40 AM 10/25/2021   10:39 AM  BP/Weight  Systolic BP 128 172 106  Diastolic BP 72 64 52  Wt. (Lbs) 167    BMI  27.79 kg/m2      Physical  Exam Vitals reviewed.  Constitutional:      Appearance: Normal appearance. He is normal weight.  Cardiovascular:     Rate and Rhythm: Normal rate and regular rhythm.     Heart sounds: Normal heart sounds.  Pulmonary:     Effort: Pulmonary effort is normal.     Breath sounds: Normal breath sounds.  Abdominal:     General: Abdomen is flat. Bowel sounds are normal.     Palpations: Abdomen is soft.  Neurological:     Mental Status: He is alert and oriented to person, place, and time.  Psychiatric:        Mood and Affect: Mood normal.        Behavior: Behavior normal.     Diabetic Foot Exam - Simple   No data filed      Lab Results  Component Value Date   WBC 8.7 10/11/2021   HGB 13.0 10/11/2021   HCT 40.8 10/11/2021   PLT 185 10/11/2021   GLUCOSE 101 (H) 10/11/2021   CHOL 126 10/11/2021   TRIG 147 10/11/2021   HDL 43 10/11/2021   LDLCALC 58 10/11/2021   ALT 11 10/11/2021   AST 21 10/11/2021   NA 142 10/11/2021   K 4.1 10/11/2021   CL 106 10/11/2021   CREATININE 0.92 10/11/2021   BUN 13 10/11/2021   CO2 22 10/11/2021   INR 1.1 10/23/2019   HGBA1C 4.8 10/23/2019      Assessment & Plan:   Problem List Items Addressed This Visit   None .  No orders of the defined types were placed in this encounter.   No orders of the defined types were placed in this encounter.    Follow-up: No follow-ups on file.  An After Visit Summary was printed and given to the patient.    I,Lauren M Auman,acting as a scribe for Blane Ohara, MD.,have documented all relevant documentation on the behalf of Blane Ohara, MD,as directed by  Blane Ohara, MD while in the presence of Blane Ohara, MD.    Blane Ohara, MD [facility] [phone]

## 2021-12-01 NOTE — Progress Notes (Signed)
error 

## 2021-12-02 LAB — CBC WITH DIFFERENTIAL/PLATELET
Basophils Absolute: 0.1 10*3/uL (ref 0.0–0.2)
Basos: 1 %
EOS (ABSOLUTE): 0.5 10*3/uL — ABNORMAL HIGH (ref 0.0–0.4)
Eos: 5 %
Hematocrit: 43.6 % (ref 37.5–51.0)
Hemoglobin: 13.1 g/dL (ref 13.0–17.7)
Immature Grans (Abs): 0 10*3/uL (ref 0.0–0.1)
Immature Granulocytes: 0 %
Lymphocytes Absolute: 2 10*3/uL (ref 0.7–3.1)
Lymphs: 19 %
MCH: 26.9 pg (ref 26.6–33.0)
MCHC: 30 g/dL — ABNORMAL LOW (ref 31.5–35.7)
MCV: 90 fL (ref 79–97)
Monocytes Absolute: 0.6 10*3/uL (ref 0.1–0.9)
Monocytes: 5 %
Neutrophils Absolute: 7.5 10*3/uL — ABNORMAL HIGH (ref 1.4–7.0)
Neutrophils: 70 %
Platelets: 171 10*3/uL (ref 150–450)
RBC: 4.87 x10E6/uL (ref 4.14–5.80)
RDW: 14.9 % (ref 11.6–15.4)
WBC: 10.7 10*3/uL (ref 3.4–10.8)

## 2021-12-02 LAB — COMPREHENSIVE METABOLIC PANEL
ALT: 16 IU/L (ref 0–44)
AST: 21 IU/L (ref 0–40)
Albumin/Globulin Ratio: 2.3 — ABNORMAL HIGH (ref 1.2–2.2)
Albumin: 4.5 g/dL (ref 3.7–4.7)
Alkaline Phosphatase: 79 IU/L (ref 44–121)
BUN/Creatinine Ratio: 13 (ref 10–24)
BUN: 13 mg/dL (ref 8–27)
Bilirubin Total: 0.8 mg/dL (ref 0.0–1.2)
CO2: 21 mmol/L (ref 20–29)
Calcium: 9.3 mg/dL (ref 8.6–10.2)
Chloride: 108 mmol/L — ABNORMAL HIGH (ref 96–106)
Creatinine, Ser: 1.04 mg/dL (ref 0.76–1.27)
Globulin, Total: 2 g/dL (ref 1.5–4.5)
Glucose: 86 mg/dL (ref 70–99)
Potassium: 4.6 mmol/L (ref 3.5–5.2)
Sodium: 145 mmol/L — ABNORMAL HIGH (ref 134–144)
Total Protein: 6.5 g/dL (ref 6.0–8.5)
eGFR: 69 mL/min/{1.73_m2} (ref >59)

## 2021-12-03 DIAGNOSIS — G2581 Restless legs syndrome: Secondary | ICD-10-CM | POA: Insufficient documentation

## 2021-12-03 HISTORY — DX: Restless legs syndrome: G25.81

## 2021-12-03 NOTE — Assessment & Plan Note (Signed)
Improved.  No longer symptomatic. 

## 2021-12-03 NOTE — Assessment & Plan Note (Signed)
Continue zetia, crestor 20 mg before bed, toprol xl 25 mg daily, and aspirin 81 mg daily.  Management per specialist.

## 2021-12-03 NOTE — Assessment & Plan Note (Signed)
Patient requested sinemet before bed. He has had this previously and is requesting a new rx.

## 2021-12-08 ENCOUNTER — Telehealth: Payer: Self-pay

## 2021-12-08 NOTE — Progress Notes (Unsigned)
Chronic Care Management Pharmacy Assistant   Name: AKONI PARTON  MRN: [id number] DOB: 29-Jun-1930   Reason for Encounter: Disease State call for HTN   Recent office visits:  12/01/21 Blane Ohara MD. Seen for hospital f/u. Started on Carbidopa-Levodopa 25-100mg .   10/26/21 Blane Ohara MD. Orders Only. D/C Gabapentin 300mg  and Started on Lyrica 75mg .   Recent consult visits:  11/30/21 (Cardiology) NP. Seen for heart failure. No med changes. Referral to heart failure clinic.   11/13/21 (Medical Surgical) Nicolasa Ducking NP. Seen for CHF. Ordered Furosemide 20mg .   Hospital visits:  Medication Reconciliation was completed by comparing discharge summary, patient's EMR and Pharmacy list, and upon discussion with patient.  Admitted to the hospital on 11/13/21 due to SOB. Discharge date was 11/13/21. Discharged from [facility].    Medications Discontinued at Hospital Discharge: -Stopped Alprazolam 0.5, Cephalexin 500mg , Simvastatin 80mg , Omeprazole 20mg , Oxycodone 5-325mg .   -All other medications will remain the same.    Medications: Outpatient Encounter Medications as of 12/08/2021  Medication Sig   acetaminophen (TYLENOL) 650 MG CR tablet Take 650 mg by mouth every 8 (eight) hours as needed for pain.    albuterol (VENTOLIN HFA) 108 (90 Base) MCG/ACT inhaler Inhale 2 puffs into the lungs every 2 (two) hours as needed for wheezing or shortness of breath.   ascorbic acid (VITAMIN C) 500 MG tablet Take 1 tablet (500 mg total) by mouth daily.   aspirin EC 81 MG tablet Take 81 mg by mouth daily.   carbidopa-levodopa (SINEMET) 25-100 MG tablet Take 1 tablet by mouth at bedtime as needed (rESTLESS LEGS.).   cholecalciferol (VITAMIN D) 25 MCG (1000 UNIT) tablet Take 1,000 Units by mouth daily.   ezetimibe (ZETIA) 10 MG tablet TAKE 1 TABLET BY MOUTH  DAILY (Patient taking differently: Take 10 mg by mouth daily.)   fluorometholone (FML) 0.1 %  ophthalmic suspension Place 1 drop into both eyes daily.   furosemide (LASIX) 20 MG tablet Take 1 tablet (20 mg total) by mouth daily.   lisinopril (ZESTRIL) 5 MG tablet Take 1 tablet (5 mg total) by mouth daily.   metoprolol succinate (TOPROL XL) 25 MG 24 hr tablet Take 1 tablet (25 mg total) by mouth daily.   nitroGLYCERIN (NITROSTAT) 0.4 MG SL tablet Place 1 tablet (0.4 mg total) under the tongue every 5 (five) minutes as needed for chest pain.   pantoprazole (PROTONIX) 40 MG tablet TAKE 1 TABLET BY MOUTH DAILY (Patient taking differently: Take 40 mg by mouth daily.)   pregabalin (LYRICA) 75 MG capsule Take 1 capsule (75 mg total) by mouth 3 (three) times daily.   rosuvastatin (CRESTOR) 20 MG tablet Take 1 tablet (20 mg total) by mouth at bedtime.   vitamin B-12 (CYANOCOBALAMIN) 500 MCG tablet Take 500 mcg by mouth daily.   zinc sulfate 220 (50 Zn) MG capsule Take 1 capsule (220 mg total) by mouth daily.   No facility-administered encounter medications on file as of 12/08/2021.     Recent Office Vitals: BP Readings from Last 3 Encounters:  12/01/21 128/72  10/25/21 (!) 172/64  10/24/21 (!) 160/78   Pulse Readings from Last 3 Encounters:  12/01/21 68  10/25/21 (!) 53  10/24/21 (!) 51    Wt Readings from Last 3 Encounters:  12/01/21 167 lb (75.8 kg)  10/24/21 167 lb (75.8 kg)  10/11/21 163 lb (73.9 kg)     Kidney Function Lab Results  Component Value Date/Time  CREATININE 1.04 12/01/2021 12:20 PM   CREATININE 0.92 10/11/2021 09:16 AM   GFRNONAA 78 05/09/2020 08:26 AM   GFRNONAA >60 03/17/2020 12:44 AM   GFRAA 90 05/09/2020 08:26 AM       Latest Ref Rng & Units 12/01/2021   12:20 PM 10/11/2021    9:16 AM 07/27/2021   10:05 AM  BMP  Glucose 70 - 99 mg/dL 86  401  83   BUN 8 - 27 mg/dL 13  13  11    Creatinine 0.76 - 1.27 mg/dL  0.27  2.53   BUN/Creat Ratio 10 - 24 13  14  13    Sodium 134 - 144 mmol/L 145  142  146   Potassium 3.5 - 5.2 mmol/L 4.6  4.1  4.0    Chloride 96 - 106 mmol/L 108  106  109   CO2 20 - 29 mmol/L 21  22  22    Calcium 8.6 - 10.2 mg/dL 9.3  9.6  9.1      Current antihypertensive regimen:  Lisinopril 5mg  daily Metoprolol Succinate 25mg  daily ( Wont take when BP is too low) Furosemide 20mg  daily  Patient verbally confirms he is taking the above medications as directed. Yes  How often are you checking your Blood Pressure? daily  he checks his blood pressure in the morning before taking his medication.  Current home BP readings: 12/08/21 123/69, 12/09/21 135/82, 95/60, 12/10/21 89/53, 125/69 12/11/21 130/66  Wrist or arm cuff:Arm  Caffeine intake:3 cups of coffee  Salt intake:Limited   Any readings above 180/120? No  What recent interventions/DTPs have been made by any provider to improve Blood Pressure control since last CPP Visit: No changes   Any recent hospitalizations or ED visits since last visit with CPP? No  What diet changes have been made to improve Blood Pressure Control?  Pt stated he doesn't eat much and drinks a lot of ensures  What exercise is being done to improve your Blood Pressure Control?  Pt does ROM exercises in bed.   Adherence Review: Is the patient currently on ACE/ARB medication? Yes Does the patient have >5 day gap between last estimated fill dates? CPP to review  Care Gaps: Last annual wellness visit? None noted   Star Rating Drugs:  Medication:  Last Fill: Day Supply  Lisinopril    10/11/21 90ds-09/08/21 30ds   12/10/21, CMA Clinical Pharmacist Assistant  [phone]

## 2021-12-11 DIAGNOSIS — I6523 Occlusion and stenosis of bilateral carotid arteries: Secondary | ICD-10-CM | POA: Diagnosis not present

## 2021-12-11 DIAGNOSIS — I10 Essential (primary) hypertension: Secondary | ICD-10-CM | POA: Diagnosis not present

## 2021-12-11 DIAGNOSIS — I771 Stricture of artery: Secondary | ICD-10-CM | POA: Diagnosis not present

## 2021-12-11 DIAGNOSIS — Z8673 Personal history of transient ischemic attack (TIA), and cerebral infarction without residual deficits: Secondary | ICD-10-CM | POA: Diagnosis not present

## 2021-12-11 DIAGNOSIS — I2510 Atherosclerotic heart disease of native coronary artery without angina pectoris: Secondary | ICD-10-CM | POA: Diagnosis not present

## 2021-12-11 DIAGNOSIS — E785 Hyperlipidemia, unspecified: Secondary | ICD-10-CM | POA: Diagnosis not present

## 2021-12-11 DIAGNOSIS — G458 Other transient cerebral ischemic attacks and related syndromes: Secondary | ICD-10-CM | POA: Diagnosis not present

## 2021-12-13 NOTE — Telephone Encounter (Signed)
Patient will come Friday to check blood pressure. He denies chest pain, SOB, dizziness, lightheaded.

## 2021-12-13 NOTE — Telephone Encounter (Signed)
Bp looks good but call patient about his BP, he is concerned lp

## 2021-12-15 ENCOUNTER — Ambulatory Visit: Payer: Medicare Other

## 2021-12-15 NOTE — Progress Notes (Signed)
Patient brought in his bp cuff that he uses at home and the reading was way off. The patient stated that he has had this bp cuff since before his wife died which he said was well over ten years ago. I told the patient to get a replacement and if it was something that was hard for him to afford, for him to call his insurance and ask them to send him a new one cause sometimes they will pay for it. Patient agreed and understood verbally.

## 2021-12-21 ENCOUNTER — Encounter (INDEPENDENT_AMBULATORY_CARE_PROVIDER_SITE_OTHER): Payer: Medicare Other | Admitting: Vascular Surgery

## 2021-12-22 DIAGNOSIS — I440 Atrioventricular block, first degree: Secondary | ICD-10-CM | POA: Diagnosis not present

## 2021-12-22 DIAGNOSIS — Z87891 Personal history of nicotine dependence: Secondary | ICD-10-CM | POA: Diagnosis not present

## 2021-12-22 DIAGNOSIS — I509 Heart failure, unspecified: Secondary | ICD-10-CM | POA: Diagnosis not present

## 2021-12-22 DIAGNOSIS — I2510 Atherosclerotic heart disease of native coronary artery without angina pectoris: Secondary | ICD-10-CM | POA: Diagnosis not present

## 2021-12-22 DIAGNOSIS — E785 Hyperlipidemia, unspecified: Secondary | ICD-10-CM | POA: Diagnosis not present

## 2021-12-22 DIAGNOSIS — I517 Cardiomegaly: Secondary | ICD-10-CM | POA: Diagnosis not present

## 2021-12-22 DIAGNOSIS — R0600 Dyspnea, unspecified: Secondary | ICD-10-CM | POA: Diagnosis not present

## 2021-12-22 DIAGNOSIS — Z951 Presence of aortocoronary bypass graft: Secondary | ICD-10-CM | POA: Diagnosis not present

## 2021-12-22 DIAGNOSIS — I110 Hypertensive heart disease with heart failure: Secondary | ICD-10-CM | POA: Diagnosis not present

## 2022-01-07 DIAGNOSIS — Z87891 Personal history of nicotine dependence: Secondary | ICD-10-CM | POA: Diagnosis not present

## 2022-01-07 DIAGNOSIS — R0602 Shortness of breath: Secondary | ICD-10-CM | POA: Diagnosis not present

## 2022-01-07 DIAGNOSIS — I517 Cardiomegaly: Secondary | ICD-10-CM | POA: Diagnosis not present

## 2022-01-07 DIAGNOSIS — R001 Bradycardia, unspecified: Secondary | ICD-10-CM | POA: Diagnosis not present

## 2022-01-07 DIAGNOSIS — I509 Heart failure, unspecified: Secondary | ICD-10-CM | POA: Diagnosis not present

## 2022-01-07 DIAGNOSIS — I2510 Atherosclerotic heart disease of native coronary artery without angina pectoris: Secondary | ICD-10-CM | POA: Diagnosis not present

## 2022-01-07 DIAGNOSIS — Z951 Presence of aortocoronary bypass graft: Secondary | ICD-10-CM | POA: Diagnosis not present

## 2022-01-07 DIAGNOSIS — E785 Hyperlipidemia, unspecified: Secondary | ICD-10-CM | POA: Diagnosis not present

## 2022-01-07 DIAGNOSIS — R918 Other nonspecific abnormal finding of lung field: Secondary | ICD-10-CM | POA: Diagnosis not present

## 2022-01-07 DIAGNOSIS — I110 Hypertensive heart disease with heart failure: Secondary | ICD-10-CM | POA: Diagnosis not present

## 2022-01-07 DIAGNOSIS — Z955 Presence of coronary angioplasty implant and graft: Secondary | ICD-10-CM | POA: Diagnosis not present

## 2022-01-08 ENCOUNTER — Ambulatory Visit (INDEPENDENT_AMBULATORY_CARE_PROVIDER_SITE_OTHER): Payer: Medicare Other

## 2022-01-08 ENCOUNTER — Telehealth: Payer: Self-pay | Admitting: Legal Medicine

## 2022-01-08 ENCOUNTER — Other Ambulatory Visit: Payer: Self-pay

## 2022-01-08 DIAGNOSIS — Z23 Encounter for immunization: Secondary | ICD-10-CM

## 2022-01-08 MED ORDER — FUROSEMIDE 20 MG PO TABS: 20.0000 mg | ORAL_TABLET | Freq: Every day | ORAL | 0 refills | Status: DC

## 2022-01-08 NOTE — Telephone Encounter (Signed)
I sent furosemide to pharmacy.

## 2022-01-08 NOTE — Telephone Encounter (Signed)
Pt came into the office request a prescription refill on flunsemide 20mg  - [employer]

## 2022-01-09 ENCOUNTER — Other Ambulatory Visit: Payer: Self-pay

## 2022-01-09 ENCOUNTER — Encounter: Payer: Self-pay | Admitting: Cardiology

## 2022-01-09 ENCOUNTER — Ambulatory Visit: Payer: Medicare Other | Attending: Cardiology | Admitting: Cardiology

## 2022-01-09 VITALS — BP 197/77 | HR 54 | Resp 18 | Ht 66.0 in | Wt 174.6 lb

## 2022-01-09 DIAGNOSIS — Z952 Presence of prosthetic heart valve: Secondary | ICD-10-CM

## 2022-01-09 DIAGNOSIS — I2510 Atherosclerotic heart disease of native coronary artery without angina pectoris: Secondary | ICD-10-CM | POA: Diagnosis not present

## 2022-01-09 DIAGNOSIS — E782 Mixed hyperlipidemia: Secondary | ICD-10-CM

## 2022-01-09 DIAGNOSIS — I10 Essential (primary) hypertension: Secondary | ICD-10-CM

## 2022-01-09 DIAGNOSIS — Z951 Presence of aortocoronary bypass graft: Secondary | ICD-10-CM | POA: Diagnosis not present

## 2022-01-09 NOTE — Patient Instructions (Signed)
Medication Instructions:  Your physician recommends that you continue on your current medications as directed. Please refer to the Current Medication list given to you today.  *If you need a refill on your cardiac medications before your next appointment, please call your pharmacy*   Lab Work: None ordered If you have labs (blood work) drawn today and your tests are completely normal, you will receive your results only by: Jette (if you have MyChart) OR A paper copy in the mail If you have any lab test that is abnormal or we need to change your treatment, we will call you to review the results.   Testing/Procedures: None ordered   Follow-Up: At [facility], you and your health needs are our priority.  As part of our continuing mission to provide you with exceptional heart care, we have created designated Provider Care Teams.  These Care Teams include your primary Cardiologist (physician) and Advanced Practice Providers (APPs -  Physician Assistants and Nurse Practitioners) who all work together to provide you with the care you need, when you need it.  We recommend signing up for the patient portal called "MyChart".  Sign up information is provided on this After Visit Summary.  MyChart is used to connect with patients for Virtual Visits (Telemedicine).  Patients are able to view lab/test results, encounter notes, upcoming appointments, etc.  Non-urgent messages can be sent to your provider as well.   To learn more about what you can do with MyChart, go to [email or web address].    Your next appointment:   2-3 weeks  The format for your next appointment:   In Person  Provider:   Jenne Campus, MD   Other Instructions NA

## 2022-01-09 NOTE — Progress Notes (Signed)
Cardiology Office Note:    Date:  01/09/2022   ID:  Jack Hodges, DOB 04-11-1930, MRN [id number]  PCP:  Jack Anes, MD  Cardiologist:  Jack Lindau, MD   Referring MD: Jack Hodges,*    ASSESSMENT:    1. Coronary artery disease involving native coronary artery of native heart without angina pectoris   2. Essential hypertension   3. S/P TAVR (transcatheter aortic valve replacement)   4. Status post coronary artery bypass graft   5. Mixed dyslipidemia    PLAN:    In order of problems listed above:  Coronary artery disease: Secondary prevention stressed with the patient.  Importance of compliance with diet and medication stressed any vocalized understanding. 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  Patient is on diuretic therapy.  I educated him about diet Daily weight checks.  His blood pressure is elevated today.  But overall his blood pressure seems to be stable based on the history he provides me.  He will keep a track of pulse blood pressure and weight and bring it back to [place] in a week for a nurse visit.  We will see Dr. Agustin Hodges in follow-up appointment in 2 to 3 weeks. Essential hypertension: Blood pressure is elevated but he tells me blood pressures are fine at home.  Its I think the issue of seeing a new doctor I reassured him.  He will keep a track of his blood pressures and if it is elevated he will call us if not 1 week nurse visit for follow-up. Dyslipidemia: On statin therapy followed by primary care and cardiology Post TAVR for severe aortic stenosis: Stable at this time.  Echo reviewed.  Patient had multiple questions which were answered to his satisfaction.   Medication Adjustments/Labs and Tests Ordered: Current medicines are reviewed at length with the patient today.  Concerns regarding medicines are outlined above.  No orders of the defined types were placed in this encounter.  No  orders of the defined types were placed in this encounter.    Chief Complaint  Patient presents with   [facility] follow up from [place] for CHF.     History of Present Illness:    Jack Hodges is a 88 y.o. male.  Patient has past medical history of essential hypertension, dyslipidemia and recently was admitted to the hospital with congestive heart failure.  Dietary education was given.  Patient was on diuretic therapy and has continued it now.  He tells me he feels much better.  Left ventricular systolic function by echo was unremarkable and the TAVR valve is functioning well.  At the time of my evaluation, the patient is alert awake oriented and in no distress.  Past Medical History:  Diagnosis Date   Acute bursitis of left shoulder 01/04/2020   Acute on chronic diastolic congestive heart failure ([place]) 11/13/2021   Last Assessment & Plan:  Formatting of this note is different from the original. Pertinent Data:   Current medication includes: furosemide - 20 mg lisinopriL - 10 mg metoprolol succinate - 25 mg.  BNP (pg/mL)  Date Value  11/15/2021 433.6 (H)   eGFR (mL/min/1.55m2)  Date Value  11/15/2021 >60.0    BP Readings from Last 3 Encounters:  11/30/21 (!) 147/82  11/15/21 (!) 109/54  11/13/21 (!) 134/74     Acute respiratory failure with hypoxia (HCC) 03/17/2020   Angina pectoris ([place] 10/07/2019   Arthritis  Atherosclerosis of aorta ([place]) 09/24/2019   Bilateral foot-drop 01/18/2017   BMI 27.0-27.9,adult 09/24/2019   Coronary artery disease involving native coronary artery of native heart without angina pectoris 11/01/2015   Overview:  Bypass surgery in 1996 Overview:  Overview:  Bypass surgery in [place]:  Continue his current regimen.  Follow up as noted.   CVA (cerebral vascular accident) ([place]) 04/03/2016   Last Assessment & Plan:  The patient underwent extensive CVA workup.  MRI is negative.  He does have aortic stenosis by echocardiogram.   Upon review of Care everywhere he is followed by Dr. Agustin Hodges, cardiologist for this.  Carotid ultrasound negative for hemodynamically significant stenosis.  Lipid panel within normal limits.  He has not had any further episodes of dizziness or nausea vomiting.    Demand ischemia 11/14/2021   Last Assessment & Plan:  Formatting of this note might be different from the original. Troponin flat No anginal symptoms Formatting of this note might be different from the original. Last Assessment & Plan:  Formatting of this note might be different from the original. Troponin flat No anginal symptoms   Dementia ([place])    Depression    DNR (do not resuscitate) 04/03/2016   Overview:  I had an extensive discussion with the patient on 04/03/2016 regarding his end of life wishes.  He and his daughter agree that do not resuscitate is in keeping with his beliefs.   Dyslipidemia 11/01/2015   Last Assessment & Plan:  Formatting of this note might be different from the original. Cholesterol 136   Triglycerides  83  HDL  50.1  LDL Calculated  69  VLDL Cholesterol Cal  17   Continue statin.   Dyspnea    with activity   Essential hypertension 11/01/2015   Last Assessment & Plan:  Resume home medications.  Follow-up as noted above.   Generalized weakness 03/13/2020   GERD (gastroesophageal reflux disease)    Herpes zoster 05/19/2019   History of aortic stenosis 03/11/2020   History of CVA (cerebrovascular accident) 11/14/2021   Last Assessment & Plan:  Formatting of this note might be different from the original. Neurologically intact upon assessment -continue aspirin, statin   Hypertensive heart disease with heart failure ([place]) 11/01/2015   Last Assessment & Plan:  Resume home medications.  Follow-up as noted above.   Myocardial infarction ([place])    approx 1994   Neuropathy    Peripheral neuropathy 11/14/2021   Last Assessment & Plan:  Formatting of this note might be different from the original. Bilateral lower  extremity cool to touch with diminished sensation Patient reports findings are chronic Continue home dose Lyrica Formatting of this note might be different from the original. Last Assessment & Plan:  Formatting of this note might be different from the original. 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   Thrombocytopenia ([place]) 03/13/2020   Trochanteric bursitis of left hip 01/19/2021    Past Surgical History:  Procedure Laterality Date   CARDIAC CATHETERIZATION     CARDIAC  SURGERY     CATARACT EXTRACTION, BILATERAL     CORONARY ARTERY BYPASS GRAFT     CORONARY STENT INTERVENTION N/A 10/07/2019   Procedure: CORONARY STENT INTERVENTION;  Surgeon: Sherren Mocha, MD;  Location: [facility];  Service: Cardiovascular;  Laterality: N/A;   EYE SURGERY     HEMORROIDECTOMY     RIGHT/LEFT HEART CATH AND CORONARY/GRAFT ANGIOGRAPHY N/A 10/07/2019   Procedure: RIGHT/LEFT HEART CATH AND CORONARY/GRAFT ANGIOGRAPHY;  Surgeon: Sherren Mocha, MD;  Location: [place] [facility];  Service: Cardiovascular;  Laterality: N/A;   TEE WITHOUT CARDIOVERSION N/A 10/27/2019   Procedure: TRANSESOPHAGEAL ECHOCARDIOGRAM (TEE);  Surgeon: Burnell Blanks, MD;  Location: [place] [facility];  Service: Open Heart Surgery;  Laterality: N/A;   TRANSCATHETER AORTIC VALVE REPLACEMENT, TRANSFEMORAL N/A 10/27/2019   Procedure: TRANSCATHETER AORTIC VALVE REPLACEMENT, TRANSFEMORAL;  Surgeon: Burnell Blanks, MD;  Location: [place] [facility];  Service: Open Heart Surgery;   Laterality: N/A;    Current Medications: Current Meds  Medication Sig   acetaminophen (TYLENOL) 650 MG CR tablet Take 650 mg by mouth every 8 (eight) hours as needed for pain.    albuterol (VENTOLIN HFA) 108 (90 Base) MCG/ACT inhaler Inhale 2 puffs into the lungs every 2 (two) hours as needed for wheezing or shortness of breath.   ascorbic acid (VITAMIN C) 500 MG tablet Take 1 tablet (500 mg total) by mouth daily.   aspirin EC 81 MG tablet Take 81 mg by mouth daily.   carbidopa-levodopa (SINEMET) 25-100 MG tablet Take 1 tablet by mouth at bedtime as needed (rESTLESS LEGS.).   cholecalciferol (VITAMIN D) 25 MCG (1000 UNIT) tablet Take 1,000 Units by mouth daily.   ezetimibe (ZETIA) 10 MG tablet TAKE 1 TABLET BY MOUTH  DAILY (Patient taking differently: Take 10 mg by mouth daily.)   fluorometholone (FML) 0.1 % ophthalmic suspension Place 1 drop into both eyes daily.   furosemide (LASIX) 20 MG tablet Take 1 tablet (20 mg total) by mouth daily.   lisinopril (ZESTRIL) 5 MG tablet Take 1 tablet (5 mg total) by mouth daily.   metoprolol succinate (TOPROL XL) 25 MG 24 hr tablet Take 1 tablet (25 mg total) by mouth daily.   pantoprazole (PROTONIX) 40 MG tablet TAKE 1 TABLET BY MOUTH DAILY (Patient taking differently: Take 40 mg by mouth daily.)   pregabalin (LYRICA) 75 MG capsule Take 1 capsule (75 mg total) by mouth 3 (three) times daily.   rosuvastatin (CRESTOR) 20 MG tablet Take 1 tablet (20 mg total) by mouth at bedtime.   vitamin B-12 (CYANOCOBALAMIN) 500 MCG tablet Take 500 mcg by mouth daily.   zinc sulfate 220 (50 Zn) MG capsule Take 1 capsule (220 mg total) by mouth daily.     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  He has one son that lives with him and one daughter that lives beside of him. He is close with his family.  His sister cooks meals for him regularly.  He has friends at the cafe he visits regularly.   Social Determinants of Health   Financial Resource Strain: Low Risk  (09/15/2021)   Overall Financial Resource Strain (CARDIA)    Difficulty of Paying Living Expenses: Not hard at all  Food Insecurity: No Food Insecurity (07/13/2020)   Hunger Vital Sign    Worried About Running Out of Food in the Last Year: Never true    Ran Out of Food in the Last Year: Never true  Transportation Needs: No Transportation Needs (09/15/2021)   PRAPARE - Hydrologist (Medical): No    Lack of Transportation (Non-Medical): No  Physical Activity: Insufficiently Active (07/13/2020)   Exercise Vital Sign    Days of Exercise per Week: 2 days    Minutes of Exercise per Session: 10 min  Stress: No Stress Concern Present (07/13/2020)   [place]    Feeling of Stress : Not at all  Social Connections: Moderately Isolated (07/13/2020)   Social Connection and Isolation Panel [NHANES]    Frequency of Communication with Friends and Family: More than three times a week    Frequency of Social Gatherings with Friends and Family: More than three times a week    Attends Religious Services: 1 to 4 times per year    Active Member of [employer] or Organizations: No    Attends Archivist Meetings: Never    Marital Status:  Widowed     Family History: The patient's family history includes Arthritis in his brother and father; Cancer in his brother and mother; Diabetes in his mother; Hypertension in his father; Kidney disease in his mother; Migraines in his father; Sleep disorder in his father and mother; Stroke in his mother.  ROS:   Please see the history of present illness.    All other systems reviewed and are negative.  EKGs/Labs/Other Studies Reviewed:    The following studies were reviewed today: I discussed my findings with the patient at length and hospital records were reviewed   Recent Labs: 12/01/2021: ALT 16; BUN 13; Creatinine, Ser 1.04; Hemoglobin 13.1; Platelets 171; Potassium 4.6; Sodium 145  Recent Lipid Panel    Component Value Date/Time   CHOL 126 10/11/2021 0916   TRIG 147 10/11/2021 0916   HDL 43 10/11/2021 0916   CHOLHDL 2.9 10/11/2021 0916   LDLCALC 58 10/11/2021 0916    Physical Exam:    VS:  BP (!) 197/77 (BP Location: Right Arm, Patient Position: Sitting, Cuff Size: Normal)   Pulse (!) 54   Resp 18   Ht '5\' 6"'  (1.676 m)   Wt 174 lb 9.6 oz (79.2 kg)   SpO2 97%   BMI 28.18 kg/m     Wt Readings from Last 3 Encounters:  01/09/22 174 lb 9.6 oz (79.2 kg)  12/01/21 167 lb (75.8 kg)  10/24/21 167 lb (75.8 kg)     GEN: Patient is in no acute distress HEENT: Normal NECK: No JVD; No carotid bruits LYMPHATICS: No lymphadenopathy CARDIAC: Hear sounds regular, 2/6 systolic murmur at the apex. RESPIRATORY:  Clear to auscultation without rales, wheezing  or rhonchi  ABDOMEN: Soft, non-tender, non-distended MUSCULOSKELETAL:  No edema; No deformity  SKIN: Warm and dry NEUROLOGIC:  Alert and oriented x 3 PSYCHIATRIC:  Normal affect   Signed, Jack Lindau, MD  01/09/2022 8:29 AM    [facility]

## 2022-01-10 ENCOUNTER — Telehealth: Payer: Self-pay

## 2022-01-10 ENCOUNTER — Other Ambulatory Visit: Payer: Self-pay

## 2022-01-10 MED ORDER — FUROSEMIDE 20 MG PO TABS: 40.0000 mg | ORAL_TABLET | Freq: Every day | ORAL | 0 refills | Status: DC

## 2022-01-10 NOTE — Progress Notes (Signed)
Chronic Care Management Pharmacy Assistant   Name: Jack Hodges  MRN: [id number] DOB: [birth year]   Reason for Encounter: Disease State call for HTN   Recent office visits:  01/17/22 Rochel Brome MD. Seen for HTN. No med changes.  01/11/22 Jerrell Belfast NP. Seen for CHF. Increased Lisinopril to 20mg  daily.  Recent consult visits:  01/16/22 (Cardiology) Jacobo Forest RN. Started Amlodipine Besylate 5mg  daily and Carvedilol 12.5 mg twice daily. D/C Metoprolol Succinate 25mg .   01/09/22 (Cardiology) Jyl Heinz MD. Seen for CAD. No med changes.  12/11/21 (Vascular Surgery) Marlyne Beards MD. Seen for Subclavian Steal Syndrome. No med changes.  Hospital visits:  Medication Reconciliation was completed by comparing discharge summary, patient's EMR and Pharmacy list, and upon discussion with patient.  Admitted to the hospital on 01/15/22 due to SOB. Discharge date was 01/15/22. Discharged from [facility].    -All medications will remain the same.    Medication Reconciliation was completed by comparing discharge summary, patient's EMR and Pharmacy list, and upon discussion with patient.  Admitted to the hospital on 01/07/22 due to Acute Exacerbation. Discharge date was 01/07/22. Discharged from [facility].    -All medications will remain the same.    12/22/21- No notes available   Medications: Outpatient Encounter Medications as of 01/10/2022  Medication Sig   acetaminophen (TYLENOL) 650 MG CR tablet Take 650 mg by mouth every 8 (eight) hours as needed for pain.    albuterol (VENTOLIN HFA) 108 (90 Base) MCG/ACT inhaler Inhale 2 puffs into the lungs every 2 (two) hours as needed for wheezing or shortness of breath.   ascorbic acid (VITAMIN C) 500 MG tablet Take 1 tablet (500 mg total) by mouth daily.   aspirin EC 81 MG tablet Take 81 mg by mouth daily.   carbidopa-levodopa (SINEMET) 25-100 MG tablet Take 1 tablet by mouth at bedtime  as needed (rESTLESS LEGS.).   cholecalciferol (VITAMIN D) 25 MCG (1000 UNIT) tablet Take 1,000 Units by mouth daily.   ezetimibe (ZETIA) 10 MG tablet TAKE 1 TABLET BY MOUTH  DAILY (Patient taking differently: Take 10 mg by mouth daily.)   fluorometholone (FML) 0.1 % ophthalmic suspension Place 1 drop into both eyes daily.   furosemide (LASIX) 20 MG tablet Take 1 tablet (20 mg total) by mouth daily.   lisinopril (ZESTRIL) 5 MG tablet Take 1 tablet (5 mg total) by mouth daily.   metoprolol succinate (TOPROL XL) 25 MG 24 hr tablet Take 1 tablet (25 mg total) by mouth daily.   nitroGLYCERIN (NITROSTAT) 0.4 MG SL tablet Place 1 tablet (0.4 mg total) under the tongue every 5 (five) minutes as needed for chest pain.   pantoprazole (PROTONIX) 40 MG tablet TAKE 1 TABLET BY MOUTH DAILY (Patient taking differently: Take 40 mg by mouth daily.)   pregabalin (LYRICA) 75 MG capsule Take 1 capsule (75 mg total) by mouth 3 (three) times daily.   rosuvastatin (CRESTOR) 20 MG tablet Take 1 tablet (20 mg total) by mouth at bedtime.   vitamin B-12 (CYANOCOBALAMIN) 500 MCG tablet Take 500 mcg by mouth daily.   zinc sulfate 220 (50 Zn) MG capsule Take 1 capsule (220 mg total) by mouth daily.   No facility-administered encounter medications on file as of 01/10/2022.     Recent Office Vitals: BP Readings from Last 3 Encounters:  01/09/22 (!) 197/77  12/15/21 110/76  12/01/21 128/72   Pulse Readings from Last 3 Encounters:  01/09/22 (!) 54  12/01/21 68  10/25/21 (!) 53    Wt Readings from Last 3 Encounters:  01/09/22 174 lb 9.6 oz (79.2 kg)  12/01/21 167 lb (75.8 kg)  10/24/21 167 lb (75.8 kg)     Kidney Function Lab Results  Component Value Date/Time   CREATININE 1.04 12/01/2021 12:20 PM   CREATININE 0.92 10/11/2021 09:16 AM   GFRNONAA 78 05/09/2020 08:26 AM   GFRNONAA >60 03/17/2020 12:44 AM   GFRAA 90 05/09/2020 08:26 AM       Latest Ref Rng & Units 12/01/2021   12:20 PM 10/11/2021    9:16 AM  07/27/2021   10:05 AM  BMP  Glucose 70 - 99 mg/dL 86  940  83   BUN 8 - 27 mg/dL 13  13  11    Creatinine 0.76 - 1.27 mg/dL  7.68  0.88   BUN/Creat Ratio 10 - 24 13  14  13    Sodium 134 - 144 mmol/L 145  142  146   Potassium 3.5 - 5.2 mmol/L 4.6  4.1  4.0   Chloride 96 - 106 mmol/L 108  106  109   CO2 20 - 29 mmol/L 21  22  22    Calcium 8.6 - 10.2 mg/dL 9.3  9.6  9.1      Current antihypertensive regimen:  Furosemide 20mg  two daily  Amlodipine 5mg  daily Carvedilol 12.5 twice daily  Lisinopril 20mg  daily  Patient verbally confirms he is taking the above medications as directed. Yes  How often are you checking your Blood Pressure? twice daily  he checks his blood pressure in the morning and in the evening before taking his medication.  Current home BP readings: (Pt started the new meds on 01/19/22) 01/02/22 180/77 01/18/22 189/68,163/76 01/19/22 211/76, 184/54 01/20/22 192/75, 152/51 01/21/22 180/75, 144/102 01/22/22 177/72  Wrist or arm cuff:Arm  Caffeine intake:3 cups of coffee  Salt intake:Limited   Any readings above 180/120? No  What recent interventions/DTPs have been made by any provider to improve Blood Pressure control since last CPP Visit: Pt was started on new BP medicine from Cardiologist   Any recent hospitalizations or ED visits since last visit with CPP? Yes  What diet changes have been made to improve Blood Pressure Control?  No diet changes  What exercise is being done to improve your Blood Pressure Control?  Pt is not exercising due to walking issues  Adherence Review: Is the patient currently on ACE/ARB medication? Yes Does the patient have >5 day gap between last estimated fill dates? CPP to review  Care Gaps: Last annual wellness visit? None noted  Star Rating Drugs:  Medication:  Last Fill: Day Supply Lisinopril                     01/11/22- 10/11/21 90ds   10-31-1967, CMA Clinical Pharmacist Assistant  [phone]

## 2022-01-11 ENCOUNTER — Encounter: Payer: Self-pay | Admitting: Nurse Practitioner

## 2022-01-11 ENCOUNTER — Ambulatory Visit (INDEPENDENT_AMBULATORY_CARE_PROVIDER_SITE_OTHER): Payer: Medicare Other | Admitting: Nurse Practitioner

## 2022-01-11 VITALS — BP 210/80 | HR 52 | Temp 97.3°F | Ht 66.0 in | Wt 182.0 lb

## 2022-01-11 DIAGNOSIS — I10 Essential (primary) hypertension: Secondary | ICD-10-CM | POA: Diagnosis not present

## 2022-01-11 DIAGNOSIS — I110 Hypertensive heart disease with heart failure: Secondary | ICD-10-CM | POA: Diagnosis not present

## 2022-01-11 MED ORDER — LISINOPRIL 20 MG PO TABS: 20.0000 mg | ORAL_TABLET | Freq: Every day | ORAL | 3 refills | Status: DC

## 2022-01-11 NOTE — Patient Instructions (Addendum)
We will call you with lab results Increase Lisinopril to 20 mg daily Monitor BP, keep log Seek emergency medical care for any severe shortness of breath or other concerning symptoms Take one packet of Miralax daily, mixed in a beverage of your choice for constipation Follow-up on 01/17/22 at 8:20 am with Dr  Tobie Poet, bring BP log to appointment   Managing Your Hypertension Hypertension, also called high blood pressure, is when the force of the blood pressing against the walls of the arteries is too strong. Arteries are blood vessels that carry blood from your heart throughout your body. Hypertension forces the heart to work harder to pump blood and may cause the arteries to become narrow or stiff. Understanding blood pressure readings A blood pressure reading includes a higher number over a lower number: The first, or top, number is called the systolic pressure. It is a measure of the pressure in your arteries as your heart beats. The second, or bottom number, is called the diastolic pressure. It is a measure of the pressure in your arteries as the heart relaxes. For most people, a normal blood pressure is below 120/80. Your personal target blood pressure may vary depending on your medical conditions, your age, and other factors. Blood pressure is classified into four stages. Based on your blood pressure reading, your health care provider may use the following stages to determine what type of treatment you need, if any. Systolic pressure and diastolic pressure are measured in a unit called millimeters of mercury (mmHg). Normal Systolic pressure: below 123456. Diastolic pressure: below 80. Elevated Systolic pressure: [id number]. Diastolic pressure: below 80. Hypertension stage 1 Systolic pressure: 0000000. Diastolic pressure: [id number]. Hypertension stage 2 Systolic pressure: [id number] or above. Diastolic pressure: 90 or above. How can this condition affect me? Managing your hypertension is very important.  Over time, hypertension can damage the arteries and decrease blood flow to parts of the body, including the brain, heart, and kidneys. Having untreated or uncontrolled hypertension can lead to: A heart attack. A stroke. A weakened blood vessel (aneurysm). Heart failure. Kidney damage. Eye damage. Memory and concentration problems. Vascular dementia. What actions can I take to manage this condition? Hypertension can be managed by making lifestyle changes and possibly by taking medicines. Your health care provider will help you make a plan to bring your blood pressure within a normal range. You may be referred for counseling on a healthy diet and physical activity. Nutrition  Eat a diet that is high in fiber and potassium, and low in salt (sodium), added sugar, and fat. An example eating plan is called the DASH diet. DASH stands for Dietary Approaches to Stop Hypertension. To eat this way: Eat plenty of fresh fruits and vegetables. Try to fill one-half of your plate at each meal with fruits and vegetables. Eat whole grains, such as whole-wheat pasta, brown rice, or whole-grain bread. Fill about one-fourth of your plate with whole grains. Eat low-fat dairy products. Avoid fatty cuts of meat, processed or cured meats, and poultry with skin. Fill about one-fourth of your plate with lean proteins such as fish, chicken without skin, beans, eggs, and tofu. Avoid pre-made and processed foods. These tend to be higher in sodium, added sugar, and fat. Reduce your daily sodium intake. Many people with hypertension should eat less than 1,500 mg of sodium a day. Lifestyle  Work with your health care provider to maintain a healthy body weight or to lose weight. Ask what an ideal weight is for you. Get  at least 30 minutes of exercise that causes your heart to beat faster (aerobic exercise) most days of the week. Activities may include walking, swimming, or biking. Include exercise to strengthen your muscles  (resistance exercise), such as weight lifting, as part of your weekly exercise routine. Try to do these types of exercises for 30 minutes at least 3 days a week. Do not use any products that contain nicotine or tobacco. These products include cigarettes, chewing tobacco, and vaping devices, such as e-cigarettes. If you need help quitting, ask your health care provider. Control any long-term (chronic) conditions you have, such as high cholesterol or diabetes. Identify your sources of stress and find ways to manage stress. This may include meditation, deep breathing, or making time for fun activities. Alcohol use Do not drink alcohol if: Your health care provider tells you not to drink. You are pregnant, may be pregnant, or are planning to become pregnant. If you drink alcohol: Limit how much you have to: 0-1 drink a day for women. 0-2 drinks a day for men. Know how much alcohol is in your drink. In the [place] one drink equals one 12 oz bottle of beer (355 mL), one 5 oz glass of wine (148 mL), or one 1 oz glass of hard liquor (44 mL). Medicines Your health care provider may prescribe medicine if lifestyle changes are not enough to get your blood pressure under control and if: Your systolic blood pressure is 130 or higher. Your diastolic blood pressure is 80 or higher. Take medicines only as told by your health care provider. Follow the directions carefully. Blood pressure medicines must be taken as told by your health care provider. The medicine does not work as well when you skip doses. Skipping doses also puts you at risk for problems. Monitoring Before you monitor your blood pressure: Do not smoke, drink caffeinated beverages, or exercise within 30 minutes before taking a measurement. Use the bathroom and empty your bladder (urinate). Sit quietly for at least 5 minutes before taking measurements. Monitor your blood pressure at home as told by your health care provider. To do this: Sit with  your back straight and supported. Place your feet flat on the floor. Do not cross your legs. Support your arm on a flat surface, such as a table. Make sure your upper arm is at heart level. Each time you measure, take two or three readings one minute apart and record the results. You may also need to have your blood pressure checked regularly by your health care provider. General information Talk with your health care provider about your diet, exercise habits, and other lifestyle factors that may be contributing to hypertension. Review all the medicines you take with your health care provider because there may be side effects or interactions. Keep all follow-up visits. Your health care provider can help you create and adjust your plan for managing your high blood pressure. Where to find more information National Heart, Lung, and Blood Institute: https://wilson-eaton.com/ American Heart Association: www.heart.org Contact a health care provider if: You think you are having a reaction to medicines you have taken. You have repeated (recurrent) headaches. You feel dizzy. You have swelling in your ankles. You have trouble with your vision. Get help right away if: You develop a severe headache or confusion. You have unusual weakness or numbness, or you feel faint. You have severe pain in your chest or abdomen. You vomit repeatedly. You have trouble breathing. These symptoms may be an emergency. Get help right  away. Call 911. Do not wait to see if the symptoms will go away. Do not drive yourself to the hospital. Summary Hypertension is when the force of blood pumping through your arteries is too strong. If this condition is not controlled, it may put you at risk for serious complications. Your personal target blood pressure may vary depending on your medical conditions, your age, and other factors. For most people, a normal blood pressure is less than 120/80. Hypertension is managed by lifestyle  changes, medicines, or both. Lifestyle changes to help manage hypertension include losing weight, eating a healthy, low-sodium diet, exercising more, stopping smoking, and limiting alcohol. This information is not intended to replace advice given to you by your health care provider. Make sure you discuss any questions you have with your health care provider. Document Revised: 11/24/2020 Document Reviewed: 11/24/2020 Elsevier Patient Education  [place].  Heart Failure Exacerbation  Heart failure is a condition in which the heart has trouble pumping blood. This may mean that the heart cannot pump enough blood out to the body or that the heart does not fill up with enough blood. When this happens, parts of the body do not get the blood and oxygen they need to function properly. This can cause symptoms such as breathing problems, tiredness (fatigue), swelling, and confusion. Heart failure exacerbation refers to heart failure symptoms that get worse. The symptoms may get worse suddenly or develop slowly over time. Heart failure exacerbation is a serious medical problem that should be treated right away. What are the causes? A heart failure exacerbation can be triggered by: Not taking your heart failure medicines correctly. Infections. Eating an unhealthy diet or a diet that is high in salt (sodium). Drinking too much fluid. Drinking alcohol. Using drugs, such as cocaine or methamphetamine. Not exercising. Other causes include: Other heart conditions such as an irregular heart rhythm (arrhythmia). Worsening heart valve function. Low blood counts (anemia). Other medical problems, such as kidney failure, thyroid problems, or diabetes mellitus. Sometimes the cause of the exacerbation is not known. What are the signs or symptoms? When heart failure symptoms suddenly or slowly get worse, this may be a sign of heart failure exacerbation. Symptoms of heart failure include: Shortness of breath  during activity or exercise. A cough that does not go away. Swelling of the legs, ankles, feet, or abdomen. Losing or gaining weight for no reason. Trouble breathing when lying down. Increased heart rate or irregular heartbeat. Fatigue. Feeling light-headed, dizzy, or close to fainting. Nausea or lack of appetite. How is this diagnosed? This condition is diagnosed based on: Your symptoms and medical history. A physical exam. You may also have tests, including: Electrocardiogram (ECG). This test measures the electrical activity of your heart. Echocardiogram. This test uses sound waves to take a picture of your heart to see how well it works. Blood tests. Imaging tests, such as: Chest X-ray. MRI. Ultrasound. Stress test. This test examines how well your heart functions while you exercise on a treadmill or exercise bike. If you cannot exercise, medicines may be used to increase your heartbeat in place of exercise. Cardiac catheterization. During this test, a thin, flexible tube (catheter) is inserted into a blood vessel and threaded up to your heart. This test allows your health care provider to check the arteries that lead to your heart (coronary arteries). Right heart catheterization. During this test, the pressure in your heart is measured. How is this treated? This condition may be treated by: Adjusting your  heart medicines. Maintaining a healthy lifestyle. This includes: Eating a heart-healthy diet that is low in sodium. Not using products that contain nicotine or tobacco. Regular exercise. Monitoring your fluid intake. Monitoring your weight and reporting changes to your health care provider. Not using alcohol or drugs. Treating sleep apnea, if you have this condition. Surgery. This may include: Placing a pacemaker to improve heart function (cardiac resynchronization therapy). Implanting a device that can correct heart rhythm problems (implantable cardioverter  defibrillator). Implanting a pulmonary arterial pressure monitor to monitor your fluid balance. Connecting a device to your heart to help it pump blood (ventricular assist device). Heart transplant. Follow these instructions at home: Medicines Take over-the-counter and prescription medicines only as told by your health care provider. Do not stop taking your medicines or change the amount you take. If you are having problems or side effects from your medicines, talk to your health care provider. If you are having difficulty paying for your medicines, contact a social worker or your clinic. There are many programs to assist with medicine costs. Talk to your health care provider before starting any new medicines or supplements. Make sure your health care provider and pharmacist have a list of all the medicines you are taking. Eating and drinking  Avoid drinking alcohol. Eat a heart-healthy diet as told by your health care provider. This includes: Plenty of fruits and vegetables. Lean proteins. Low-fat dairy. Whole grains. Foods that are low in sodium. Activity  Exercise regularly as told by your health care provider. Balance exercise with rest. Ask your health care provider what activities are safe for you. This includes sexual activity, exercise, and daily tasks at home or work. Lifestyle Do not use any products that contain nicotine or tobacco. These products include cigarettes, chewing tobacco, and vaping devices, such as e-cigarettes. If you need help quitting, ask your health care provider. Maintain a healthy weight. Ask your health care provider what weight is healthy for you. Consider joining a patient support group. This can help with emotional problems you may have, such as stress and anxiety. Do not use drugs. General instructions Stay up to date with vaccines. Talk to your health care provider about flu and pneumonia vaccines. Keep a list of medicines that you are taking. This  may help in emergency situations. Keep all follow-up visits. This is important. Contact a health care provider if: You have questions about your medicines or you miss a dose. You feel anxious, depressed, or stressed. You develop swelling in your feet, ankles, legs, or abdomen. You develop a cough. You have a fever. You have trouble sleeping. You gain 2-3 lb (1-1.4 kg) in 24 hours or 5 lb (2.3 kg) in a week. Get help right away if: You have chest pain or pressure. You have shortness of breath while resting. You have severe fatigue. You are confused. You have severe dizziness. You have a rapid or irregular heartbeat. You have nausea or you vomit. You have a cough that is worse at night or you cannot lie flat. You have severe depression or sadness. These symptoms may represent a serious problem that is an emergency. Do not wait to see if the symptoms will go away. Get medical help right away. Call your local emergency services (911 in the [place] Do not drive yourself to the hospital. Summary When heart failure symptoms get worse, it is called heart failure exacerbation. Common causes of this condition include taking medicines incorrectly, infections, and drinking alcohol. This condition may be treated  by adjusting medicines, maintaining a healthy lifestyle, or surgery. Do not stop taking your medicines or change the amount you take. If you are having problems or side effects from your medicines, talk to your health care provider. This information is not intended to replace advice given to you by your health care provider. Make sure you discuss any questions you have with your health care provider. Document Revised: 06/20/2021 Document Reviewed: 10/03/2019 Elsevier Patient Education  [place].

## 2022-01-11 NOTE — Progress Notes (Signed)
Subjective:  Patient ID: Jack Hodges, male    DOB: 1930/10/08  Age: 88 y.o. MRN: [id number]  Chief Complaint  Patient presents with   Uncontrolled hypertension    HPI Pt presents with uncontrolled hypertension. History of CAD, HFpEF, HTN, and TAVR for severe aortic stenosis. He was recently seen at [facility] ED in [place] on 01/07/22. He had hospital follow-up with Dr Geraldo Pitter on 01/09/22. BP was elevated 197/77. He is currently prescribed Lisinopril 5 mg, Metoprolol 25 mg, and Lasix 40 mg QD. BP 230/82. Pt denies headache, cp, or dizziness. States he has mild dyspnea with activity, orthopnea, and pedal edema. States his abdomen is distended and tight, reports chronic constipation.   Current Outpatient Medications on File Prior to Visit  Medication Sig Dispense Refill   acetaminophen (TYLENOL) 650 MG CR tablet Take 650 mg by mouth every 8 (eight) hours as needed for pain.      albuterol (VENTOLIN HFA) 108 (90 Base) MCG/ACT inhaler Inhale 2 puffs into the lungs every 2 (two) hours as needed for wheezing or shortness of breath. 6.7 g 6   ascorbic acid (VITAMIN C) 500 MG tablet Take 1 tablet (500 mg total) by mouth daily. 30 tablet 0   aspirin EC 81 MG tablet Take 81 mg by mouth daily.     carbidopa-levodopa (SINEMET) 25-100 MG tablet Take 1 tablet by mouth at bedtime as needed (rESTLESS LEGS.). 90 tablet 1   cholecalciferol (VITAMIN D) 25 MCG (1000 UNIT) tablet Take 1,000 Units by mouth daily.     ezetimibe (ZETIA) 10 MG tablet TAKE 1 TABLET BY MOUTH  DAILY (Patient taking differently: Take 10 mg by mouth daily.) 90 tablet 3   fluorometholone (FML) 0.1 % ophthalmic suspension Place 1 drop into both eyes daily.     furosemide (LASIX) 20 MG tablet Take 2 tablets (40 mg total) by mouth daily. 60 tablet 0   lisinopril (ZESTRIL) 5 MG tablet Take 1 tablet (5 mg total) by mouth daily. 90 tablet 2   metoprolol succinate (TOPROL XL) 25 MG 24 hr tablet Take 1 tablet (25 mg total) by mouth  daily. 90 tablet 2   pantoprazole (PROTONIX) 40 MG tablet TAKE 1 TABLET BY MOUTH DAILY (Patient taking differently: Take 40 mg by mouth daily.) 100 tablet 2   pregabalin (LYRICA) 75 MG capsule Take 1 capsule (75 mg total) by mouth 3 (three) times daily. 270 capsule 0   rosuvastatin (CRESTOR) 20 MG tablet Take 1 tablet (20 mg total) by mouth at bedtime. 90 tablet 3   vitamin B-12 (CYANOCOBALAMIN) 500 MCG tablet Take 500 mcg by mouth daily.     zinc sulfate 220 (50 Zn) MG capsule Take 1 capsule (220 mg total) by mouth daily. 30 capsule 0   nitroGLYCERIN (NITROSTAT) 0.4 MG SL tablet Place 1 tablet (0.4 mg total) under the tongue every 5 (five) minutes as needed for chest pain. 25 tablet 2   No current facility-administered medications on file prior to visit.   Past Medical History:  Diagnosis Date   Acute bursitis of left shoulder 01/04/2020   Acute on chronic diastolic congestive heart failure ([place]) 11/13/2021   Last Assessment & Plan:  Formatting of this note is different from the original. Pertinent Data:   Current medication includes: furosemide - 20 mg lisinopriL - 10 mg metoprolol succinate - 25 mg.  BNP (pg/mL)  Date Value  11/15/2021 433.6 (H)   eGFR (mL/min/1.83m2)  Date Value  11/15/2021 >60.0  BP Readings from Last 3 Encounters:  11/30/21 (!) 147/82  11/15/21 (!) 109/54  11/13/21 (!) 134/74     Acute respiratory failure with hypoxia (HCC) 03/17/2020   Angina pectoris ([place]) 10/07/2019   Arthritis    Atherosclerosis of aorta ([place]) 09/24/2019   Bilateral foot-drop 01/18/2017   BMI 27.0-27.9,adult 09/24/2019   Coronary artery disease involving native coronary artery of native heart without angina pectoris 11/01/2015   Overview:  Bypass surgery in 1996 Overview:  Overview:  Bypass surgery in [place]:  Continue his current regimen.  Follow up as noted.   CVA (cerebral vascular accident) ([place]) 04/03/2016   Last Assessment & Plan:  The patient underwent extensive CVA  workup.  MRI is negative.  He does have aortic stenosis by echocardiogram.  Upon review of Care everywhere he is followed by Dr. Agustin Cree, cardiologist for this.  Carotid ultrasound negative for hemodynamically significant stenosis.  Lipid panel within normal limits.  He has not had any further episodes of dizziness or nausea vomiting.    Demand ischemia 11/14/2021   Last Assessment & Plan:  Formatting of this note might be different from the original. Troponin flat No anginal symptoms Formatting of this note might be different from the original. Last Assessment & Plan:  Formatting of this note might be different from the original. Troponin flat No anginal symptoms   Dementia ([place])    Depression    DNR (do not resuscitate) 04/03/2016   Overview:  I had an extensive discussion with the patient on 04/03/2016 regarding his end of life wishes.  He and his daughter agree that do not resuscitate is in keeping with his beliefs.   Dyslipidemia 11/01/2015   Last Assessment & Plan:  Formatting of this note might be different from the original. Cholesterol 136   Triglycerides  83  HDL  50.1  LDL Calculated  69  VLDL Cholesterol Cal  17   Continue statin.   Dyspnea    with activity   Essential hypertension 11/01/2015   Last Assessment & Plan:  Resume home medications.  Follow-up as noted above.   Generalized weakness 03/13/2020   GERD (gastroesophageal reflux disease)    Herpes zoster 05/19/2019   History of aortic stenosis 03/11/2020   History of CVA (cerebrovascular accident) 11/14/2021   Last Assessment & Plan:  Formatting of this note might be different from the original. Neurologically intact upon assessment -continue aspirin, statin   Hypertensive heart disease with heart failure ([place]) 11/01/2015   Last Assessment & Plan:  Resume home medications.  Follow-up as noted above.   Myocardial infarction ([place])    approx 1994   Neuropathy    Peripheral neuropathy 11/14/2021   Last Assessment & Plan:   Formatting of this note might be different from the original. Bilateral lower extremity cool to touch with diminished sensation Patient reports findings are chronic Continue home dose Lyrica Formatting of this note might be different from the original. Last Assessment & Plan:  Formatting of this note might be different from the original. 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   Thrombocytopenia ([place]) 03/13/2020   Trochanteric bursitis of left hip 01/19/2021   Past Surgical History:  Procedure Laterality Date   CARDIAC CATHETERIZATION     CARDIAC SURGERY     CATARACT EXTRACTION, BILATERAL     CORONARY ARTERY BYPASS GRAFT     CORONARY STENT INTERVENTION N/A 10/07/2019   Procedure: CORONARY STENT INTERVENTION;  Surgeon: Sherren Mocha, MD;  Location: [facility];  Service: Cardiovascular;  Laterality: N/A;   EYE SURGERY     HEMORROIDECTOMY     RIGHT/LEFT HEART CATH AND CORONARY/GRAFT ANGIOGRAPHY N/A 10/07/2019   Procedure: RIGHT/LEFT HEART CATH AND CORONARY/GRAFT ANGIOGRAPHY;  Surgeon: Sherren Mocha, MD;  Location: [facility];  Service: Cardiovascular;  Laterality: N/A;   TEE WITHOUT CARDIOVERSION N/A 10/27/2019   Procedure: TRANSESOPHAGEAL ECHOCARDIOGRAM (TEE);  Surgeon: Burnell Blanks, MD;  Location: [facility];  Service: Open Heart Surgery;  Laterality: N/A;   TRANSCATHETER AORTIC VALVE REPLACEMENT, TRANSFEMORAL N/A 10/27/2019   Procedure: TRANSCATHETER AORTIC VALVE REPLACEMENT, TRANSFEMORAL;  Surgeon: Burnell Blanks, MD;  Location: [facility];  Service: Open Heart Surgery;  Laterality: N/A;    Family History  Problem Relation Age of Onset   Cancer Mother    Diabetes Mother    Kidney disease Mother    Stroke Mother    Sleep disorder Mother    Arthritis Father    Migraines Father    Hypertension Father    Sleep disorder Father    Arthritis Brother    Cancer Brother    Social History   Socioeconomic History   Marital status: Widowed    Spouse name: Curly [place]   Number of children: 2   Years of education: Not on file   Highest education level: Not on file  Occupational History   Occupation: Retired    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  He has one son that lives with him and one daughter that lives beside of him. He is close with his family.  His sister cooks meals for him regularly.  He has friends at the cafe he visits regularly.   Social Determinants of Health   Financial Resource Strain: Low Risk  (09/15/2021)   Overall Financial Resource Strain (CARDIA)    Difficulty of Paying Living Expenses: Not hard at all  Food Insecurity: No Food Insecurity (07/13/2020)   Hunger Vital Sign    Worried About Running Out of Food in the Last Year: Never true    Ran Out of Food in the Last Year: Never true  Transportation Needs: No Transportation Needs (09/15/2021)   PRAPARE - Hydrologist (Medical): No    Lack of Transportation (Non-Medical): No  Physical Activity: Insufficiently Active (07/13/2020)   Exercise Vital Sign    Days of  Exercise per Week: 2 days    Minutes of Exercise per Session: 10 min  Stress: No Stress Concern Present (  07/13/2020)   [place]    Feeling of Stress : Not at all  Social Connections: Moderately Isolated (07/13/2020)   Social Connection and Isolation Panel [NHANES]    Frequency of Communication with Friends and Family: More than three times a week    Frequency of Social Gatherings with Friends and Family: More than three times a week    Attends Religious Services: 1 to 4 times per year    Active Member of [employer] or Organizations: No    Attends Archivist Meetings: Never    Marital Status: Widowed    Review of Systems  Constitutional:  Negative for chills, fatigue, fever and unexpected weight change.  HENT:  Negative for congestion, ear pain, sinus pain and sore throat.   Respiratory:  Positive for chest tightness and shortness of breath.   Cardiovascular:  Positive for leg swelling. Negative for chest pain and palpitations.  Gastrointestinal:  Positive for constipation (chronic). Negative for abdominal pain, blood in stool, diarrhea, nausea and vomiting.  Endocrine: Negative for polydipsia.  Genitourinary:  Negative for dysuria.  Musculoskeletal:  Negative for back pain.  Skin:  Negative for rash.  Neurological:  Negative for headaches.     Objective:  BP (!) 230/82   Pulse (!) 52   Temp (!) 97.3 F (36.3 C)   Ht _0  (1.676 m)   Wt 182 lb (82.6 kg)   SpO2 96%   BMI 29.38 kg/m  BP (!) 210/80   Pulse (!) 52   Temp (!) 97.3 F (36.3 C)   Ht _1  (1.676 m)   Wt 182 lb (82.6 kg)   SpO2 96%   BMI 29.38 kg/m      01/11/2022   10:45 AM 01/09/2022    8:00 AM 12/15/2021   10:42 AM  BP/Weight  Systolic BP 655 374 827  Diastolic BP 82 77 76  Wt. (Lbs) 182 174.6   BMI 29.38 kg/m2 28.18 kg/m2     Physical Exam Vitals reviewed.  Constitutional:      Appearance: Normal appearance.  HENT:      Right Ear: Tympanic membrane normal.     Left Ear: Tympanic membrane normal.     Nose: Nose normal.     Mouth/Throat:     Mouth: Mucous membranes are moist.  Cardiovascular:     Rate and Rhythm: Bradycardia present. Rhythm irregular.     Pulses: Normal pulses.     Heart sounds: Heart sounds are distant.  Pulmonary:     Effort: Pulmonary effort is normal.     Breath sounds: Normal breath sounds.  Abdominal:     General: Bowel sounds are normal.     Palpations: Abdomen is soft.  Musculoskeletal:     Right lower leg: 1+ Edema present.     Left lower leg: 1+ Edema present.  Skin:    General: Skin is warm and dry.     Capillary Refill: Capillary refill takes less than 2 seconds.  Neurological:     General: No focal deficit present.     Mental Status: He is alert and oriented to person, place, and time.  Psychiatric:        Mood and Affect: Mood normal.        Behavior: Behavior normal.         Lab Results  Component Value Date   WBC 10.7 12/01/2021   HGB 13.1 12/01/2021   HCT 43.6 12/01/2021  PLT 171 12/01/2021   GLUCOSE 86 12/01/2021   CHOL 126 10/11/2021   TRIG 147 10/11/2021   HDL 43 10/11/2021   LDLCALC 58 10/11/2021   ALT 16 12/01/2021   AST 21 12/01/2021   NA 145 (H) 12/01/2021   K 4.6 12/01/2021   CL 108 (H) 12/01/2021   CREATININE 1.04 12/01/2021   BUN 13 12/01/2021   CO2 21 12/01/2021   INR 1.1 10/23/2019   HGBA1C 4.8 10/23/2019      Assessment & Plan:   1. Hypertensive heart disease with heart failure (HCC) - Comprehensive metabolic panel - CBC with Differential/Platelet - Pro b natriuretic peptide  2. Uncontrolled hypertension - Comprehensive metabolic panel - CBC with Differential/Platelet - lisinopril (ZESTRIL) 20 MG tablet; Take 1 tablet (20 mg total) by mouth daily.  Dispense: 90 tablet; Refill: 3   We will call you with lab results Increase Lisinopril to 20 mg daily Monitor BP, keep log Seek emergency medical care for any severe  shortness of breath or other concerning symptoms Take one packet of Miralax daily, mixed in a beverage of your choice for constipation Follow-up on 01/17/22 at 8:20 am with Dr  Tobie Poet, bring BP log to appointment     Follow-up:   01/17/22 at 8:20 am with Dr Tobie Poet, chronic fasting follow-up                                                                                                                                      An After Visit Summary was printed and given to the patient.  I, Rip Harbour, NP, have reviewed all documentation for this visit. The documentation on 01/11/22 for the exam, diagnosis, procedures, and orders are all accurate and complete.    Signed, Rip Harbour, NP [place] [phone]

## 2022-01-12 LAB — CBC WITH DIFFERENTIAL/PLATELET
Basophils Absolute: 0.1 10*3/uL (ref 0.0–0.2)
Basos: 1 %
EOS (ABSOLUTE): 0.5 10*3/uL — ABNORMAL HIGH (ref 0.0–0.4)
Eos: 5 %
Hematocrit: 36 % — ABNORMAL LOW (ref 37.5–51.0)
Hemoglobin: 11.6 g/dL — ABNORMAL LOW (ref 13.0–17.7)
Immature Grans (Abs): 0 10*3/uL (ref 0.0–0.1)
Immature Granulocytes: 0 %
Lymphocytes Absolute: 1.2 10*3/uL (ref 0.7–3.1)
Lymphs: 12 %
MCH: 27.2 pg (ref 26.6–33.0)
MCHC: 32.2 g/dL (ref 31.5–35.7)
MCV: 84 fL (ref 79–97)
Monocytes Absolute: 0.5 10*3/uL (ref 0.1–0.9)
Monocytes: 5 %
Neutrophils Absolute: 7.7 10*3/uL — ABNORMAL HIGH (ref 1.4–7.0)
Neutrophils: 77 %
Platelets: 186 10*3/uL (ref 150–450)
RBC: 4.27 x10E6/uL (ref 4.14–5.80)
RDW: 15.3 % (ref 11.6–15.4)
WBC: 10 10*3/uL (ref 3.4–10.8)

## 2022-01-12 LAB — COMPREHENSIVE METABOLIC PANEL
ALT: 14 IU/L (ref 0–44)
AST: 23 IU/L (ref 0–40)
Albumin/Globulin Ratio: 2.1 (ref 1.2–2.2)
Albumin: 4.1 g/dL (ref 3.7–4.7)
Alkaline Phosphatase: 103 IU/L (ref 44–121)
BUN/Creatinine Ratio: 17 (ref 10–24)
BUN: 15 mg/dL (ref 8–27)
Bilirubin Total: 0.9 mg/dL (ref 0.0–1.2)
CO2: 19 mmol/L — ABNORMAL LOW (ref 20–29)
Calcium: 9.1 mg/dL (ref 8.6–10.2)
Chloride: 105 mmol/L (ref 96–106)
Creatinine, Ser: 0.9 mg/dL (ref 0.76–1.27)
Globulin, Total: 2 g/dL (ref 1.5–4.5)
Glucose: 158 mg/dL — ABNORMAL HIGH (ref 70–99)
Potassium: 4.1 mmol/L (ref 3.5–5.2)
Sodium: 142 mmol/L (ref 134–144)
Total Protein: 6.1 g/dL (ref 6.0–8.5)
eGFR: 82 mL/min/{1.73_m2} (ref >59)

## 2022-01-12 LAB — PRO B NATRIURETIC PEPTIDE: NT-Pro BNP: 4599 pg/mL — ABNORMAL HIGH (ref 0–486)

## 2022-01-14 ENCOUNTER — Telehealth: Payer: Self-pay | Admitting: Nurse Practitioner

## 2022-01-14 ENCOUNTER — Other Ambulatory Visit: Payer: Self-pay | Admitting: Nurse Practitioner

## 2022-01-14 DIAGNOSIS — I110 Hypertensive heart disease with heart failure: Secondary | ICD-10-CM

## 2022-01-14 NOTE — Telephone Encounter (Signed)
BNP elevated over 4,000, telephoned pt to go to ED. Pt agreed to go to [facility] in [place], [place]. Will refer to heart failure clinic

## 2022-01-15 ENCOUNTER — Ambulatory Visit: Payer: Medicare Other | Admitting: Cardiology

## 2022-01-15 DIAGNOSIS — I110 Hypertensive heart disease with heart failure: Secondary | ICD-10-CM | POA: Diagnosis not present

## 2022-01-15 DIAGNOSIS — R0601 Orthopnea: Secondary | ICD-10-CM | POA: Diagnosis not present

## 2022-01-15 DIAGNOSIS — R0602 Shortness of breath: Secondary | ICD-10-CM | POA: Diagnosis not present

## 2022-01-15 DIAGNOSIS — R0600 Dyspnea, unspecified: Secondary | ICD-10-CM | POA: Diagnosis not present

## 2022-01-15 DIAGNOSIS — I2510 Atherosclerotic heart disease of native coronary artery without angina pectoris: Secondary | ICD-10-CM | POA: Diagnosis not present

## 2022-01-15 DIAGNOSIS — I517 Cardiomegaly: Secondary | ICD-10-CM | POA: Diagnosis not present

## 2022-01-15 DIAGNOSIS — Z87891 Personal history of nicotine dependence: Secondary | ICD-10-CM | POA: Diagnosis not present

## 2022-01-15 DIAGNOSIS — J90 Pleural effusion, not elsewhere classified: Secondary | ICD-10-CM | POA: Diagnosis not present

## 2022-01-15 DIAGNOSIS — Z951 Presence of aortocoronary bypass graft: Secondary | ICD-10-CM | POA: Diagnosis not present

## 2022-01-15 DIAGNOSIS — E785 Hyperlipidemia, unspecified: Secondary | ICD-10-CM | POA: Diagnosis not present

## 2022-01-15 DIAGNOSIS — R918 Other nonspecific abnormal finding of lung field: Secondary | ICD-10-CM | POA: Diagnosis not present

## 2022-01-15 DIAGNOSIS — I5033 Acute on chronic diastolic (congestive) heart failure: Secondary | ICD-10-CM | POA: Diagnosis not present

## 2022-01-16 ENCOUNTER — Ambulatory Visit: Payer: Medicare Other | Attending: Cardiology

## 2022-01-16 DIAGNOSIS — I10 Essential (primary) hypertension: Secondary | ICD-10-CM

## 2022-01-16 MED ORDER — CARVEDILOL 12.5 MG PO TABS: 12.5000 mg | ORAL_TABLET | Freq: Two times a day (BID) | ORAL | 3 refills | Status: DC

## 2022-01-16 MED ORDER — AMLODIPINE BESYLATE 5 MG PO TABS: 5.0000 mg | ORAL_TABLET | Freq: Every day | ORAL | 3 refills | Status: DC

## 2022-01-16 NOTE — Progress Notes (Signed)
   Nurse Visit   Date of Encounter: 01/16/2022 ID: Jack Hodges, DOB 19-Nov-1930, MRN [id number]  PCP:  Lillard Anes, [place] Cardiologist:  Jenne Campus, MD      Visit Details   VS:  There were no vitals taken for this visit. , BMI There is no height or weight on file to calculate BMI.  Wt Readings from Last 3 Encounters:  01/11/22 182 lb (82.6 kg)  01/09/22 174 lb 9.6 oz (79.2 kg)  12/01/21 167 lb (75.8 kg)     Reason for visit: BP check and look at BP log per Dr. Geraldo Pitter Performed today: Vitals, review BP log, consulted Dr. Bettina Gavia Changes (medications, testing, etc.) : Stop Metoprolol. Start Carvediolol 12.5 mg 1 twice daily, Norvasc 5mg  1 tablet daily Length of Visit: 15 minutes    Medications Adjustments/Labs and Tests Ordered: No orders of the defined types were placed in this encounter.  No orders of the defined types were placed in this encounter.    Signed, Tyler Pita, RN  01/16/2022 9:34 AM

## 2022-01-16 NOTE — Patient Instructions (Addendum)
Medication Instructions:  Your physician has recommended you make the following change in your medication:   STOP: Metoprolol   START: Carvedilol 12.5mg  1 tablet twice daily  START: Norvasc 5mg  1 tablet daily    Lab Work: None Ordered If you have labs (blood work) drawn today and your tests are completely normal, you will receive your results only by: MyChart Message (if you have MyChart) OR A paper copy in the mail If you have any lab test that is abnormal or we need to change your treatment, we will call you to review the results.   Testing/Procedures: None Ordered   Follow-Up: At [facility], you and your health needs are our priority.  As part of our continuing mission to provide you with exceptional heart care, we have created designated Provider Care Teams.  These Care Teams include your primary Cardiologist (physician) and Advanced Practice Providers (APPs -  Physician Assistants and Nurse Practitioners) who all work together to provide you with the care you need, when you need it.  We recommend signing up for the patient portal called "MyChart".  Sign up information is provided on this After Visit Summary.  MyChart is used to connect with patients for Virtual Visits (Telemedicine).  Patients are able to view lab/test results, encounter notes, upcoming appointments, etc.  Non-urgent messages can be sent to your provider as well.   To learn more about what you can do with MyChart, go to [email or web address].    Your next appointment:    Dr. Tobie Poet 10-25- 23 @ 8:20AM  Dr. Agustin Cree 01-26-22 @ 12:40pm  The format for your next appointment:   In Person  Provider:   Jenne Campus, MD    Other Instructions NA

## 2022-01-17 ENCOUNTER — Ambulatory Visit (INDEPENDENT_AMBULATORY_CARE_PROVIDER_SITE_OTHER): Payer: Medicare Other | Admitting: Family Medicine

## 2022-01-17 ENCOUNTER — Encounter: Payer: Self-pay | Admitting: Family Medicine

## 2022-01-17 VITALS — BP 150/70 | HR 82 | Temp 97.2°F | Ht 67.0 in | Wt 162.0 lb

## 2022-01-17 DIAGNOSIS — I110 Hypertensive heart disease with heart failure: Secondary | ICD-10-CM | POA: Diagnosis not present

## 2022-01-17 NOTE — Progress Notes (Signed)
Subjective:  Patient ID: Jack Hodges, male    DOB: 04-Sep-1930  Age: 88 y.o. MRN: [id number]  Chief Complaint  Patient presents with   Hypertension    HPI Jack Hodges is an 88 year old white male with past medical history significant for hypertension, coronary artery disease, heart failure, status post TAVR for severe aortic stenosis who presented to our office on January 11, 2022 and saw Jerrell Belfast, NP for hospital follow-up.  He was seen at [facility] emergency department for elevated blood pressure.  It went as high as 230/82.  His main complaint was shortness of breath, orthopnea, and pedal edema.  At that time [facility] increased his lisinopril to 20 mg daily.  To be checked his proBNP and it was elevated over 4000.  He returned to the emergency department at [facility] in [place].  Chest x-ray at that time showed mild stable bilateral edema.  Troponin 0.04.  proBNP improved to 1379 following significant IV diuresis with Lasix.  2100 mL were diuresed.  Patient was discharged home and followed up with cardiology yesterday.  At that time his blood pressure was still significantly elevated.  A physician did not see the patient but was recommended to stop his metoprolol and started on carvedilol 12.5 mg 1 p.o. twice daily and to start Norvasc 5 mg daily.  The patient is tolerating that and has felt that it has helped.  His blood pressure is improved today however still elevated. Patient did bring his blood pressure and weight logs today.  Current Outpatient Medications on File Prior to Visit  Medication Sig Dispense Refill   acetaminophen (TYLENOL) 650 MG CR tablet Take 650 mg by mouth every 8 (eight) hours as needed for pain.      albuterol (VENTOLIN HFA) 108 (90 Base) MCG/ACT inhaler Inhale 2 puffs into the lungs every 2 (two) hours as needed for wheezing or shortness of breath. 6.7 g 6   amLODipine (NORVASC) 5 MG tablet Take 1 tablet (5 mg total) by mouth daily. 90 tablet 3    ascorbic acid (VITAMIN C) 500 MG tablet Take 1 tablet (500 mg total) by mouth daily. 30 tablet 0   aspirin EC 81 MG tablet Take 81 mg by mouth daily.     carbidopa-levodopa (SINEMET) 25-100 MG tablet Take 1 tablet by mouth at bedtime as needed (rESTLESS LEGS.). 90 tablet 1   carvedilol (COREG) 12.5 MG tablet Take 1 tablet (12.5 mg total) by mouth 2 (two) times daily. 180 tablet 3   cholecalciferol (VITAMIN D) 25 MCG (1000 UNIT) tablet Take 1,000 Units by mouth daily.     ezetimibe (ZETIA) 10 MG tablet TAKE 1 TABLET BY MOUTH  DAILY (Patient taking differently: Take 10 mg by mouth daily.) 90 tablet 3   fluorometholone (FML) 0.1 % ophthalmic suspension Place 1 drop into both eyes daily.     furosemide (LASIX) 20 MG tablet Take 2 tablets (40 mg total) by mouth daily. 60 tablet 0   lisinopril (ZESTRIL) 20 MG tablet Take 1 tablet (20 mg total) by mouth daily. 90 tablet 3   pantoprazole (PROTONIX) 40 MG tablet TAKE 1 TABLET BY MOUTH DAILY (Patient taking differently: Take 40 mg by mouth daily.) 100 tablet 2   pregabalin (LYRICA) 75 MG capsule Take 1 capsule (75 mg total) by mouth 3 (three) times daily. 270 capsule 0   rosuvastatin (CRESTOR) 20 MG tablet Take 1 tablet (20 mg total) by mouth at bedtime. 90 tablet 3   vitamin  B-12 (CYANOCOBALAMIN) 500 MCG tablet Take 500 mcg by mouth daily.     zinc sulfate 220 (50 Zn) MG capsule Take 1 capsule (220 mg total) by mouth daily. 30 capsule 0   nitroGLYCERIN (NITROSTAT) 0.4 MG SL tablet Place 1 tablet (0.4 mg total) under the tongue every 5 (five) minutes as needed for chest pain. 25 tablet 2   No current facility-administered medications on file prior to visit.   Past Medical History:  Diagnosis Date   Acute bursitis of left shoulder 01/04/2020   Acute on chronic diastolic congestive heart failure ([place]) 11/13/2021   Last Assessment & Plan:  Formatting of this note is different from the original. Pertinent Data:   Current medication includes: furosemide - 20  mg lisinopriL - 10 mg metoprolol succinate - 25 mg.  BNP (pg/mL)  Date Value  11/15/2021 433.6 (H)   eGFR (mL/min/1.78m2)  Date Value  11/15/2021 >60.0    BP Readings from Last 3 Encounters:  11/30/21 (!) 147/82  11/15/21 (!) 109/54  11/13/21 (!) 134/74     Acute respiratory failure with hypoxia (HCC) 03/17/2020   Angina pectoris ([place] 10/07/2019   Arthritis    Atherosclerosis of aorta ([place] 09/24/2019   Bilateral foot-drop 01/18/2017   BMI 27.0-27.9,adult 09/24/2019   Coronary artery disease involving native coronary artery of native heart without angina pectoris 11/01/2015   Overview:  Bypass surgery in 1996 Overview:  Overview:  Bypass surgery in [place]  Continue his current regimen.  Follow up as noted.   CVA (cerebral vascular accident) ([place] 04/03/2016   Last Assessment & Plan:  The patient underwent extensive CVA workup.  MRI is negative.  He does have aortic stenosis by echocardiogram.  Upon review of Care everywhere he is followed by Dr. KAgustin Cree cardiologist for this.  Carotid ultrasound negative for hemodynamically significant stenosis.  Lipid panel within normal limits.  He has not had any further episodes of dizziness or nausea vomiting.    Demand ischemia 11/14/2021   Last Assessment & Plan:  Formatting of this note might be different from the original. Troponin flat No anginal symptoms Formatting of this note might be different from the original. Last Assessment & Plan:  Formatting of this note might be different from the original. Troponin flat No anginal symptoms   Dementia ([place]    Depression    DNR (do not resuscitate) 04/03/2016   Overview:  I had an extensive discussion with the patient on 04/03/2016 regarding his end of life wishes.  He and his daughter agree that do not resuscitate is in keeping with his beliefs.   Dyslipidemia 11/01/2015   Last Assessment & Plan:  Formatting of this note might be different from the original. Cholesterol 136    Triglycerides  83  HDL  50.1  LDL Calculated  69  VLDL Cholesterol Cal  17   Continue statin.   Dyspnea    with activity   Essential hypertension 11/01/2015   Last Assessment & Plan:  Resume home medications.  Follow-up as noted above.   Generalized weakness 03/13/2020   GERD (gastroesophageal reflux disease)    Herpes zoster 05/19/2019   History of aortic stenosis 03/11/2020   History of CVA (cerebrovascular accident) 11/14/2021   Last Assessment & Plan:  Formatting of this note might be different from the original. Neurologically intact upon assessment -continue aspirin, statin   Hypertensive heart disease with heart failure ([place] 11/01/2015   Last Assessment & Plan:  Resume home  medications.  Follow-up as noted above.   Myocardial infarction ([place])    approx 1994   Neuropathy    Peripheral neuropathy 11/14/2021   Last Assessment & Plan:  Formatting of this note might be different from the original. Bilateral lower extremity cool to touch with diminished sensation Patient reports findings are chronic Continue home dose Lyrica Formatting of this note might be different from the original. Last Assessment & Plan:  Formatting of this note might be different from the original. 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   Thrombocytopenia ([place]) 03/13/2020   Trochanteric bursitis of left hip 01/19/2021   Past Surgical History:  Procedure Laterality Date   CARDIAC CATHETERIZATION     CARDIAC SURGERY     CATARACT EXTRACTION, BILATERAL     CORONARY  ARTERY BYPASS GRAFT     CORONARY STENT INTERVENTION N/A 10/07/2019   Procedure: CORONARY STENT INTERVENTION;  Surgeon: Sherren Mocha, MD;  Location: [place] [facility];  Service: Cardiovascular;  Laterality: N/A;   EYE SURGERY     HEMORROIDECTOMY     RIGHT/LEFT HEART CATH AND CORONARY/GRAFT ANGIOGRAPHY N/A 10/07/2019   Procedure: RIGHT/LEFT HEART CATH AND CORONARY/GRAFT ANGIOGRAPHY;  Surgeon: Sherren Mocha, MD;  Location: [place] [facility];  Service: Cardiovascular;  Laterality: N/A;   TEE WITHOUT CARDIOVERSION N/A 10/27/2019   Procedure: TRANSESOPHAGEAL ECHOCARDIOGRAM (TEE);  Surgeon: Burnell Blanks, MD;  Location: [place] [facility];  Service: Open Heart Surgery;  Laterality: N/A;   TRANSCATHETER AORTIC VALVE REPLACEMENT, TRANSFEMORAL N/A 10/27/2019   Procedure: TRANSCATHETER AORTIC VALVE REPLACEMENT, TRANSFEMORAL;  Surgeon: Burnell Blanks, MD;  Location: [place] [facility];  Service: Open Heart Surgery;  Laterality: N/A;    Family History  Problem Relation Age of Onset   Cancer Mother    Diabetes Mother    Kidney disease Mother    Stroke Mother    Sleep disorder Mother    Arthritis Father    Migraines Father    Hypertension Father    Sleep disorder Father    Arthritis Brother    Cancer Brother    Social History   Socioeconomic History   Marital status: Widowed    Spouse name: Curly [place]   Number of children: 2   Years of education: Not on file   Highest education level: Not on file  Occupational History   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  He has one son that lives with him and one daughter that lives beside of him. He is  close with his family.  His sister cooks meals for him regularly.  He has friends at the cafe he visits regularly.   Social Determinants of Health   Financial Resource Strain: Low Risk  (09/15/2021)   Overall Financial Resource Strain (CARDIA)    Difficulty of Paying Living Expenses: Not hard at all  Food Insecurity: No Food Insecurity (07/13/2020)   Hunger Vital Sign    Worried About Running Out of Food in the Last Year: Never true    Ran Out of Food in the Last Year: Never true  Transportation Needs: No Transportation Needs (09/15/2021)   PRAPARE - Hydrologist (Medical): No    Lack of Transportation (Non-Medical): No  Physical Activity: Insufficiently Active (07/13/2020)   Exercise Vital Sign    Days of Exercise per Week: 2 days    Minutes of Exercise per Session: 10 min  Stress: No Stress Concern Present (07/13/2020)   [place]    Feeling of Stress : Not at all  Social Connections: Moderately Isolated (07/13/2020)   Social Connection and Isolation Panel [NHANES]    Frequency of Communication with Friends and Family: More than three times a week    Frequency of Social Gatherings with Friends and Family: More than three times a week    Attends Religious Services: 1 to 4 times per year    Active Member of [employer] or Organizations: No    Attends Archivist Meetings: Never    Marital Status: Widowed    Review of Systems  Constitutional:  Negative for fatigue.  HENT:  Negative for congestion, ear pain and sore throat.   Respiratory:  Negative for cough and shortness of breath.   Cardiovascular:  Negative for chest pain.  Gastrointestinal:  Negative for abdominal pain, constipation, diarrhea, nausea and vomiting.  Genitourinary:  Negative for dysuria, frequency and urgency.   Musculoskeletal:  Negative for arthralgias, back pain and myalgias.  Neurological:  Negative for dizziness and headaches.  Psychiatric/Behavioral:  Negative for agitation and sleep disturbance. The patient is not nervous/anxious.      Objective:  BP (!) 150/70   Pulse 82   Temp (!) 97.2 F (36.2 C) (Temporal)   Ht '5\' 7"'  (1.702 m)   Wt 162 lb (73.5 kg)   SpO2 95%   BMI 25.37 kg/m      01/17/2022    8:32 AM 01/16/2022    9:36 AM 01/16/2022    9:35 AM  BP/Weight  Systolic BP 828 003 491  Diastolic BP 70 82 88  Wt. (Lbs) 162  164  BMI 25.37 kg/m2  26.47 kg/m2    Physical Exam Vitals reviewed.  Constitutional:      Appearance: Normal appearance.  Cardiovascular:     Rate and Rhythm: Normal rate and regular rhythm.     Heart sounds: Normal heart sounds.  Pulmonary:     Effort: Pulmonary effort is normal.     Breath sounds: Normal breath sounds.  Abdominal:     General: Bowel sounds are normal.     Tenderness: There is no abdominal tenderness.  Musculoskeletal:     Right lower leg: No edema.     Left lower leg: No edema.  Neurological:     Mental Status: He is alert.  Psychiatric:        Mood and Affect: Mood normal.        Behavior: Behavior normal.     Diabetic  Foot Exam - Simple   No data filed      Lab Results  Component Value Date   WBC 10.0 01/11/2022   HGB 11.6 (L) 01/11/2022   HCT 36.0 (L) 01/11/2022   PLT 186 01/11/2022   GLUCOSE 158 (H) 01/11/2022   CHOL 126 10/11/2021   TRIG 147 10/11/2021   HDL 43 10/11/2021   LDLCALC 58 10/11/2021   ALT 14 01/11/2022   AST 23 01/11/2022   NA 142 01/11/2022   K 4.1 01/11/2022   CL 105 01/11/2022   CREATININE 0.90 01/11/2022   BUN 15 01/11/2022   CO2 19 (L) 01/11/2022   INR 1.1 10/23/2019   HGBA1C 4.8 10/23/2019      Assessment & Plan:   Problem List Items Addressed This Visit       Cardiovascular and Mediastinum   Hypertensive heart disease with heart failure ([place]) - Primary    I  encouraged the patient to continue to weigh himself daily and follow the protocols given by cardiology. Continue to check blood pressure daily. Keep appointment with Dr. Agustin Cree coming up.  No lab work done today as it was just done 48 hours ago. We will schedule the patient for his annual wellness visit.  Patient reports he has a living will somewhere but does not know where it is.  We will have Shelle Iron, LPN work on this with him.     .  No orders of the defined types were placed in this encounter.   No orders of the defined types were placed in this encounter.    Follow-up: Return for awv Shelle Iron. .  An After Visit Summary was printed and given to the patient.  Rochel Brome, MD [facility] [phone]

## 2022-01-17 NOTE — Assessment & Plan Note (Signed)
I encouraged the patient to continue to weigh himself daily and follow the protocols given by cardiology. Continue to check blood pressure daily. Keep appointment with Dr. Agustin Cree coming up.  No lab work done today as it was just done 48 hours ago. We will schedule the patient for his annual wellness visit.  Patient reports he has a living will somewhere but does not know where it is.  We will have Shelle Iron, LPN work on this with him.

## 2022-01-23 NOTE — Telephone Encounter (Signed)
Told patient to double Amlodipine dose 5mg ->10mg  per Cardio. Will have CCM team reach out next week for more BP readings, told patient we'd call. Gave notification to [facility]

## 2022-01-23 NOTE — Addendum Note (Signed)
Addended by: Lane Hacker on: 01/23/2022 03:14 PM   Modules accepted: Orders

## 2022-01-24 ENCOUNTER — Ambulatory Visit (INDEPENDENT_AMBULATORY_CARE_PROVIDER_SITE_OTHER): Payer: Medicare Other

## 2022-01-24 VITALS — BP 132/64 | HR 80 | Resp 16 | Ht 66.0 in | Wt 160.0 lb

## 2022-01-24 DIAGNOSIS — Z0000 Encounter for general adult medical examination without abnormal findings: Secondary | ICD-10-CM

## 2022-01-24 NOTE — Progress Notes (Signed)
Subjective:   Jack Hodges is a 88 y.o. male who presents for Medicare Annual/Subsequent preventive examination.  This wellness visit is conducted by a nurse.  The patient's medications were reviewed and reconciled since the patient's last visit.  History details were provided by the patient.  The history appears to be reliable.    Medical History: Patient history and Family history was reviewed  Medications, Allergies, and preventative health maintenance was reviewed and updated.  Cardiac Risk Factors include: advanced age (>5mn, >>35women);dyslipidemia;male gender;hypertension     Objective:    Today's Vitals   01/24/22 2016  BP: 132/64  Pulse: 80  Resp: 16  Weight: 160 lb (72.6 kg)  Height: 5' 6" (1.676 m)   Body mass index is 25.82 kg/m.     07/13/2020    9:37 AM 03/14/2020    4:00 PM 03/14/2020    3:34 PM 10/28/2019    9:00 AM 10/27/2019    6:33 AM 10/23/2019    9:29 AM 10/20/2019    1:38 PM  Advanced Directives  Does Patient Have a Medical Advance Directive? Yes  Yes Yes  Yes Yes  Type of [place] will Living will  Living will  Living will Living will  Does patient want to make changes to medical advance directive? No - Patient declined No - Patient declined  No - Patient declined No - Patient declined No - Patient declined   Copy of [place] Chart? No - copy requested          Current Medications (verified) Outpatient Encounter Medications as of 01/24/2022  Medication Sig   acetaminophen (TYLENOL) 650 MG CR tablet Take 650 mg by mouth every 8 (eight) hours as needed for pain.    albuterol (VENTOLIN HFA) 108 (90 Base) MCG/ACT inhaler Inhale 2 puffs into the lungs every 2 (two) hours as needed for wheezing or shortness of breath.   amLODipine (NORVASC) 10 MG tablet Take 10 mg by mouth daily.   ascorbic acid (VITAMIN C) 500 MG tablet Take 1 tablet (500 mg total) by mouth daily.   aspirin EC 81 MG  tablet Take 81 mg by mouth daily.   carbidopa-levodopa (SINEMET) 25-100 MG tablet Take 1 tablet by mouth at bedtime as needed (rESTLESS LEGS.).   carvedilol (COREG) 12.5 MG tablet Take 1 tablet (12.5 mg total) by mouth 2 (two) times daily.   cholecalciferol (VITAMIN D) 25 MCG (1000 UNIT) tablet Take 1,000 Units by mouth daily.   ezetimibe (ZETIA) 10 MG tablet TAKE 1 TABLET BY MOUTH  DAILY (Patient taking differently: Take 10 mg by mouth daily.)   fluorometholone (FML) 0.1 % ophthalmic suspension Place 1 drop into both eyes daily.   furosemide (LASIX) 20 MG tablet Take 2 tablets (40 mg total) by mouth daily.   pantoprazole (PROTONIX) 40 MG tablet TAKE 1 TABLET BY MOUTH DAILY (Patient taking differently: Take 40 mg by mouth daily.)   pregabalin (LYRICA) 75 MG capsule Take 1 capsule (75 mg total) by mouth 3 (three) times daily.   rosuvastatin (CRESTOR) 20 MG tablet Take 1 tablet (20 mg total) by mouth at bedtime.   sertraline (ZOLOFT) 50 MG tablet Take 50 mg by mouth daily.   vitamin B-12 (CYANOCOBALAMIN) 500 MCG tablet Take 500 mcg by mouth daily.   zinc sulfate 220 (50 Zn) MG capsule Take 1 capsule (220 mg total) by mouth daily.   lisinopril (ZESTRIL) 20 MG tablet Take 1 tablet (20  mg total) by mouth daily. (Patient not taking: Reported on 01/24/2022)   nitroGLYCERIN (NITROSTAT) 0.4 MG SL tablet Place 1 tablet (0.4 mg total) under the tongue every 5 (five) minutes as needed for chest pain.   No facility-administered encounter medications on file as of 01/24/2022.    Allergies (verified) Poison oak extract   History: Past Medical History:  Diagnosis Date   Acute bursitis of left shoulder 01/04/2020   Acute on chronic diastolic congestive heart failure ([place]) 11/13/2021   Last Assessment & Plan:  Formatting of this note is different from the original. Pertinent Data:   Current medication includes: furosemide - 20 mg lisinopriL - 10 mg metoprolol succinate - 25 mg.  BNP (pg/mL)  Date Value   11/15/2021 433.6 (H)   eGFR (mL/min/1.75m2)  Date Value  11/15/2021 >60.0    BP Readings from Last 3 Encounters:  11/30/21 (!) 147/82  11/15/21 (!) 109/54  11/13/21 (!) 134/74     Acute respiratory failure with hypoxia (HCC) 03/17/2020   Angina pectoris ([place] 10/07/2019   Arthritis    Atherosclerosis of aorta ([place] 09/24/2019   Bilateral foot-drop 01/18/2017   BMI 27.0-27.9,adult 09/24/2019   Coronary artery disease involving native coronary artery of native heart without angina pectoris 11/01/2015   Overview:  Bypass surgery in 1996 Overview:  Overview:  Bypass surgery in 1[place]  Continue his current regimen.  Follow up as noted.   CVA (cerebral vascular accident) ([place] 04/03/2016   Last Assessment & Plan:  The patient underwent extensive CVA workup.  MRI is negative.  He does have aortic stenosis by echocardiogram.  Upon review of Care everywhere he is followed by Dr. KAgustin Cree cardiologist for this.  Carotid ultrasound negative for hemodynamically significant stenosis.  Lipid panel within normal limits.  He has not had any further episodes of dizziness or nausea vomiting.    Demand ischemia 11/14/2021   Last Assessment & Plan:  Formatting of this note might be different from the original. Troponin flat No anginal symptoms Formatting of this note might be different from the original. Last Assessment & Plan:  Formatting of this note might be different from the original. Troponin flat No anginal symptoms   Dementia ([place]    Depression    DNR (do not resuscitate) 04/03/2016   Overview:  I had an extensive discussion with the patient on 04/03/2016 regarding his end of life wishes.  He and his daughter agree that do not resuscitate is in keeping with his beliefs.   Dyslipidemia 11/01/2015   Last Assessment & Plan:  Formatting of this note might be different from the original. Cholesterol 136   Triglycerides  83  HDL  50.1  LDL Calculated  69  VLDL Cholesterol Cal  17   Continue  statin.   Dyspnea    with activity   Essential hypertension 11/01/2015   Last Assessment & Plan:  Resume home medications.  Follow-up as noted above.   Generalized weakness 03/13/2020   GERD (gastroesophageal reflux disease)    Herpes zoster 05/19/2019   History of aortic stenosis 03/11/2020   History of CVA (cerebrovascular accident) 11/14/2021   Last Assessment & Plan:  Formatting of this note might be different from the original. Neurologically intact upon assessment -continue aspirin, statin   Hypertensive heart disease with heart failure ([place] 11/01/2015   Last Assessment & Plan:  Resume home medications.  Follow-up as noted above.   Myocardial infarction ([place]    approx 1994  Neuropathy    Peripheral neuropathy 11/14/2021   Last Assessment & Plan:  Formatting of this note might be different from the original. Bilateral lower extremity cool to touch with diminished sensation Patient reports findings are chronic Continue home dose Lyrica Formatting of this note might be different from the original. Last Assessment & Plan:  Formatting of this note might be different from the original. 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   Thrombocytopenia ([place]) 03/13/2020   Trochanteric bursitis of left hip 01/19/2021   Past Surgical History:  Procedure Laterality Date   CARDIAC CATHETERIZATION     CARDIAC SURGERY     CATARACT EXTRACTION, BILATERAL     CORONARY ARTERY BYPASS GRAFT     CORONARY STENT INTERVENTION N/A 10/07/2019   Procedure: CORONARY  STENT INTERVENTION;  Surgeon: Sherren Mocha, MD;  Location: [facility];  Service: Cardiovascular;  Laterality: N/A;   EYE SURGERY     HEMORROIDECTOMY     RIGHT/LEFT HEART CATH AND CORONARY/GRAFT ANGIOGRAPHY N/A 10/07/2019   Procedure: RIGHT/LEFT HEART CATH AND CORONARY/GRAFT ANGIOGRAPHY;  Surgeon: Sherren Mocha, MD;  Location: [facility];  Service: Cardiovascular;  Laterality: N/A;   TEE WITHOUT CARDIOVERSION N/A 10/27/2019   Procedure: TRANSESOPHAGEAL ECHOCARDIOGRAM (TEE);  Surgeon: Burnell Blanks, MD;  Location: [facility];  Service: Open Heart Surgery;  Laterality: N/A;   TRANSCATHETER AORTIC VALVE REPLACEMENT, TRANSFEMORAL N/A 10/27/2019   Procedure: TRANSCATHETER AORTIC VALVE REPLACEMENT, TRANSFEMORAL;  Surgeon: Burnell Blanks, MD;  Location: [facility];  Service: Open Heart Surgery;  Laterality: N/A;   Family History  Problem Relation Age of Onset   Cancer Mother    Diabetes Mother    Kidney disease Mother    Stroke Mother    Sleep disorder Mother    Arthritis Father    Migraines Father    Hypertension Father    Sleep disorder Father    Arthritis Brother    Cancer Brother    Social History   Socioeconomic History   Marital status: Widowed    Spouse name: Jack Hodges   Number of children: 2   Years of education: Not on file   Highest education level: Not on file  Occupational History   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  He has one son that lives with him and one daughter that lives  beside of him. He is close with his family.  His sister cooks meals for him regularly.  He has friends at the cafe  he visits regularly.   Social Determinants of Health   Financial Resource Strain: Low Risk  (09/15/2021)   Overall Financial Resource Strain (CARDIA)    Difficulty of Paying Living Expenses: Not hard at all  Food Insecurity: No Food Insecurity (07/13/2020)   Hunger Vital Sign    Worried About Running Out of Food in the Last Year: Never true    Ran Out of Food in the Last Year: Never true  Transportation Needs: No Transportation Needs (09/15/2021)   PRAPARE - Hydrologist (Medical): No    Lack of Transportation (Non-Medical): No  Physical Activity: Insufficiently Active (07/13/2020)   Exercise Vital Sign    Days of Exercise per Week: 2 days    Minutes of Exercise per Session: 10 min  Stress: No Stress Concern Present (07/13/2020)   Vivian    Feeling of Stress : Not at all  Social Connections: Moderately Isolated (07/13/2020)   Social Connection and Isolation Panel [NHANES]    Frequency of Communication with Friends and Family: More than three times a week    Frequency of Social Gatherings with Friends and Family: More than three times a week    Attends Religious Services: 1 to 4 times per year    Active Member of [employer] or Organizations: No    Attends Archivist Meetings: Never    Marital Status: Widowed    Tobacco Counseling Counseling given: Not Answered   Clinical Intake:  Pre-visit preparation completed: Yes  Pain : No/denies pain     BMI - recorded: 25.82 Nutritional Status: BMI 25 -29 Overweight Nutritional Risks: None Diabetes: No  How often do you need to have someone help you when you read instructions, pamphlets, or other written materials from your doctor or pharmacy?: 1 - Never Interpreter Needed?: No    Activities of Daily Living     01/24/2022    8:28 PM  In your present state of health, do you have any difficulty performing the following activities:  Hearing? 1  Vision? 0  Difficulty concentrating or making decisions? 0  Walking or climbing stairs? 1  Dressing or bathing? 0  Doing errands, shopping? 0  Preparing Food and eating ? N  Using the Toilet? N  In the past six months, have you accidently leaked urine? N  Do you have problems with loss of bowel control? N  Managing your Medications? Y  Managing your Finances? N  Housekeeping or managing your Housekeeping? N    Patient Care Team: Rochel Brome, MD as PCP - General (Family Medicine) Park Liter, MD as PCP - Cardiology (Cardiology) Lane Hacker, [facility] as Pharmacist (Pharmacist)     Assessment:   This is a routine wellness examination for Jack Hodges.  Hearing/Vision screen No results found.  Dietary issues and exercise activities discussed: Current Exercise Habits: The patient does not participate in regular exercise at present   Depression Screen    01/24/2022    8:26 PM 10/11/2021    8:43 AM 08/01/2021    2:17 PM 05/16/2021   10:32 AM 07/13/2020    8:57 AM 08/05/2019   10:24 AM  PHQ 2/9 Scores  PHQ - 2 Score 4 4 0 0 2 2  PHQ- 9 Score _0 Fall Risk    01/24/2022    8:26 PM 08/01/2021    2:17 PM 05/16/2021   10:32 AM 07/13/2020  8:56 AM 05/19/2019    2:53 PM  Fall Risk   Falls in the past year? 0  _0 Number falls in past yr: 0 _1 0  Injury with Fall? 0 0 1 0 1  Risk for fall due to : Impaired balance/gait   History of fall(s);Impaired balance/gait Impaired balance/gait;Impaired mobility  Follow up Falls evaluation completed;Education provided;Falls prevention discussed Falls evaluation completed  Falls evaluation completed;Education provided;Falls prevention discussed Education provided    FALL RISK PREVENTION PERTAINING TO THE HOME:  Any stairs in or around the home? No  If so, are there any without handrails? No   Home free of loose throw rugs in walkways, pet beds, electrical cords, etc? Yes  Adequate lighting in your home to reduce risk of falls? Yes   ASSISTIVE DEVICES UTILIZED TO PREVENT FALLS:  Life alert? No  Use of a cane, walker or w/c? Yes  Grab bars in the bathroom? No  Shower chair or bench in shower? No  Elevated toilet seat or a handicapped toilet? No    Cognitive Function:        01/24/2022    8:29 PM 07/13/2020    9:42 AM  6CIT Screen  What Year? 0 points 0 points  What month? 0 points 0 points  What time? 0 points 0 points  Count back from 20 0 points 0 points  Months in reverse 2 points 2 points  Repeat phrase 0 points 0 points  Total Score 2 points 2 points    Immunizations Immunization History  Administered Date(s) Administered   Fluad Quad(high Dose 65+) 12/10/2018, 01/25/2020, 01/12/2021, 01/08/2022   Pneumococcal Conjugate-13 12/20/2017   Pneumococcal Polysaccharide-23 03/20/2012   Tdap 07/03/2019   Zoster Recombinat (Shingrix) 09/02/2020    TDAP status: Up to date  Flu Vaccine status: Up to date  Pneumococcal vaccine status: Up to date  Covid-19 vaccine status: Declined, Education has been provided regarding the importance of this vaccine but patient still declined. Advised may receive this vaccine at local pharmacy or Health Dept.or vaccine clinic. Aware to provide a copy of the vaccination record if obtained from local pharmacy or Health Dept. Verbalized acceptance and understanding.  Qualifies for Shingles Vaccine? Yes   Zostavax completed No   Shingrix Completed?: No.    Education has been provided regarding the importance of this vaccine. Patient has been advised to call insurance company to determine out of pocket expense if they have not yet received this vaccine. Advised may also receive vaccine at local pharmacy or Health Dept. Verbalized acceptance and understanding.  Screening Tests Health Maintenance  Topic Date Due   Zoster Vaccines-  Shingrix (2 of 2) 10/28/2020   Medicare Annual Wellness (AWV)  07/13/2021   TETANUS/TDAP  07/02/2029   Pneumonia Vaccine 69+ Years old  Completed   INFLUENZA VACCINE  Completed   HPV VACCINES  Aged Out   COVID-19 Vaccine  Discontinued    Health Maintenance  Health Maintenance Due  Topic Date Due   Zoster Vaccines- Shingrix (2 of 2) 10/28/2020   Medicare Annual Wellness (AWV)  07/13/2021    Colorectal cancer screening: No longer required.   Lung Cancer Screening: (Low Dose CT Chest recommended if Age 75-80 years, 30 pack-year currently smoking OR have quit w/in 15years.) does not qualify.    Additional Screening:  Vision Screening: Recommended annual ophthalmology exams for early detection of glaucoma and other disorders of the eye. Is the patient up to date with their annual  eye exam?  Yes   Dental Screening: Recommended annual dental exams for proper oral hygiene  Community Resource Referral / Chronic Care Management: CRR required this visit?  No   CCM required this visit?  No      Plan:    Shingrix vaccine recommended Discussed healthy low-sodium heart healthy diet I will verify medication questions with provider   I have personally reviewed and noted the following in the patient's chart:   Medical and social history Use of alcohol, tobacco or illicit drugs  Current medications and supplements including opioid prescriptions.  Functional ability and status Nutritional status Physical activity Advanced directives List of other physicians Hospitalizations, surgeries, and ER visits in previous 12 months Vitals Screenings to include cognitive, depression, and falls Referrals and appointments  In addition, I have reviewed and discussed with patient certain preventive protocols, quality metrics, and best practice recommendations. A written personalized care plan for preventive services as well as general preventive health recommendations were provided to  patient.     Erie Noe, LPN   [id number]

## 2022-01-26 ENCOUNTER — Ambulatory Visit: Payer: Medicare Other | Attending: Cardiology | Admitting: Cardiology

## 2022-01-26 ENCOUNTER — Encounter: Payer: Self-pay | Admitting: Cardiology

## 2022-01-26 VITALS — BP 150/68 | HR 56 | Ht 66.0 in | Wt 164.0 lb

## 2022-01-26 DIAGNOSIS — I110 Hypertensive heart disease with heart failure: Secondary | ICD-10-CM | POA: Diagnosis not present

## 2022-01-26 DIAGNOSIS — Z952 Presence of prosthetic heart valve: Secondary | ICD-10-CM | POA: Diagnosis not present

## 2022-01-26 DIAGNOSIS — I340 Nonrheumatic mitral (valve) insufficiency: Secondary | ICD-10-CM | POA: Insufficient documentation

## 2022-01-26 DIAGNOSIS — I2510 Atherosclerotic heart disease of native coronary artery without angina pectoris: Secondary | ICD-10-CM | POA: Diagnosis not present

## 2022-01-26 HISTORY — DX: Nonrheumatic mitral (valve) insufficiency: I34.0

## 2022-01-26 NOTE — Progress Notes (Signed)
Cardiology Office Note:    Date:  01/26/2022   ID:  Jack Hodges, DOB [birth year], MRN [id number]  PCP:  Rochel Brome, MD  Cardiologist:  Jenne Campus, MD    Referring MD: Lillard Anes   No chief complaint on file.   History of Present Illness:    Jack Hodges is a 88 y.o. male    with past medical history significant for coronary artery disease, in 1996 he required coronary artery bypass graft.  Also in summer 2020 when he got cardiac catheterization done he underwent PTCA and stenting of left anterior descending artery in view of the fact that he got completely occluded LIMA going to [facility].  That cardiac catheterization was done in preparation for TAVI that eventually ended up having done on 10/27/2019.  He received Edwards sapiens 3 26 mm valve.  He also got history of bilateral foot drop, essential hypertension, dyslipidemia.  Comes today to my office for follow-up.  Recently he ended up going to the emergency room after he was noted to have shortness of breath mild swelling of lower extremities chest x-ray showed potential congestion.  He was also noted to have very high blood pressure.  He was given appropriate medication blood pressure has been reduced he is doing much better.  Echocardiogram reviewed from the summertime it showed normal ejection fraction, normally functioning aortic valve prosthesis, he did have mild to moderate mitral regurgitation.  I suspect the mechanism of his decompensation was simply worsening of his mitral regurgitation secondary to severe elevation of the blood pressure.  All dose seems to be corrected and he is doing better clinically.  Past Medical History:  Diagnosis Date   Acute bursitis of left shoulder 01/04/2020   Acute on chronic diastolic congestive heart failure ([place]) 11/13/2021   Last Assessment & Plan:  Formatting of this note is different from the original. Pertinent Data:   Current medication includes: furosemide - 20 mg lisinopriL -  10 mg metoprolol succinate - 25 mg.  BNP (pg/mL)  Date Value  11/15/2021 433.6 (H)   eGFR (mL/min/1.17m2)  Date Value  11/15/2021 >60.0    BP Readings from Last 3 Encounters:  11/30/21 (!) 147/82  11/15/21 (!) 109/54  11/13/21 (!) 134/74     Acute respiratory failure with hypoxia (HCC) 03/17/2020   Angina pectoris ([place] 10/07/2019   Arthritis    Atherosclerosis of aorta ([place] 09/24/2019   Bilateral foot-drop 01/18/2017   BMI 27.0-27.9,adult 09/24/2019   Coronary artery disease involving native coronary artery of native heart without angina pectoris 11/01/2015   Overview:  Bypass surgery in 1996 Overview:  Overview:  Bypass surgery in [place]  Continue his current regimen.  Follow up as noted.   [place]rebral vascular [place]) [place] 04/03[place]2016   Last Assessment & Plan:  The patient underwent extensive CVA workup.  MRI is negative.  He does have aortic stenosis by echocardiogram.  Upon review of Care everywhere he is followed by Dr. KAgustin Hodges cardiologist for this.  Carotid ultrasound negative for hemodynamically significant stenosis.  Lipid panel within normal limits.  He has not had any further episodes of dizziness or nausea vomiting.    Demand ischemia 11/14/2021   Last Assessment & Plan:  Formatting of this note might be different from the original. Troponin flat No anginal symptoms Formatting of this note might be different from the original. Last Assessment & Plan:  Formatting of this note might be different from the original. Troponin  flat No anginal symptoms   Dementia (Jack Hodges)    Depression    DNR (do not resuscitate) 04/03/2016   Overview:  I had an extensive discussion with the patient on 04/03/2016 regarding his end of life wishes.  He and his daughter agree that do not resuscitate is in keeping with his beliefs.   Dyslipidemia 11/01/2015   Last Assessment & Plan:  Formatting of this note might be different from the original. Cholesterol 136   Triglycerides  83  HDL   50.1  LDL Calculated  69  VLDL Cholesterol Cal  17   Continue statin.   Dyspnea    with activity   Essential hypertension 11/01/2015   Last Assessment & Plan:  Resume home medications.  Follow-up as noted above.   Generalized weakness 03/13/2020   GERD (gastroesophageal reflux disease)    Herpes zoster 05/19/2019   History of aortic stenosis 03/11/2020   History of CVA (cerebrovascular accident) 11/14/2021   Last Assessment & Plan:  Formatting of this note might be different from the original. Neurologically intact upon assessment -continue aspirin, statin   Hypertensive heart disease with heart failure (Jack Hodges) 11/01/2015   Last Assessment & Plan:  Resume home medications.  Follow-up as noted above.   Myocardial infarction (Jack Hodges)    approx 1994   Neuropathy    Peripheral neuropathy 11/14/2021   Last Assessment & Plan:  Formatting of this note might be different from the original. Bilateral lower extremity cool to touch with diminished sensation Patient reports findings are chronic Continue home dose Lyrica Formatting of this note might be different from the original. Last Assessment & Plan:  Formatting of this note might be different from the original. 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   Thrombocytopenia (Jack Hodges) 03/13/2020   Trochanteric bursitis of left hip 01/19/2021    Past Surgical History:  Procedure Laterality Date   CARDIAC CATHETERIZATION     CARDIAC SURGERY     CATARACT EXTRACTION, BILATERAL     CORONARY ARTERY BYPASS GRAFT      CORONARY STENT INTERVENTION N/A 10/07/2019   Procedure: CORONARY STENT INTERVENTION;  Surgeon: Jack Mocha, MD;  Location: [facility];  Service: Cardiovascular;  Laterality: N/A;   EYE SURGERY     HEMORROIDECTOMY     RIGHT/LEFT HEART CATH AND CORONARY/GRAFT ANGIOGRAPHY N/A 10/07/2019   Procedure: RIGHT/LEFT HEART CATH AND CORONARY/GRAFT ANGIOGRAPHY;  Surgeon: Jack Mocha, MD;  Location: [facility];  Service: Cardiovascular;  Laterality: N/A;   TEE WITHOUT CARDIOVERSION N/A 10/27/2019   Procedure: TRANSESOPHAGEAL ECHOCARDIOGRAM (TEE);  Surgeon: Burnell Blanks, MD;  Location: [facility];  Service: Open Heart Surgery;  Laterality: N/A;   TRANSCATHETER AORTIC VALVE REPLACEMENT, TRANSFEMORAL N/A 10/27/2019   Procedure: TRANSCATHETER AORTIC VALVE REPLACEMENT, TRANSFEMORAL;  Surgeon: Burnell Blanks, MD;  Location: [facility];  Service: Open Heart Surgery;  Laterality: N/A;    Current Medications: Current Meds  Medication Sig   acetaminophen (TYLENOL) 650 MG CR tablet Take 650 mg by mouth every 8 (eight)  hours as needed for pain.    albuterol (VENTOLIN HFA) 108 (90 Base) MCG/ACT inhaler Inhale 2 puffs into the lungs every 2 (two) hours as needed for wheezing or shortness of breath.   amLODipine (NORVASC) 10 MG tablet Take 10 mg by mouth daily.   ascorbic acid (VITAMIN C) 500 MG tablet Take 1 tablet (500 mg total) by mouth daily.   aspirin EC 81 MG tablet Take 81 mg by mouth daily.   carbidopa-levodopa (SINEMET) 25-100 MG tablet Take 1 tablet by mouth at bedtime as needed (rESTLESS LEGS.).   carvedilol (COREG) 12.5 MG tablet Take 1 tablet (12.5 mg total) by mouth 2 (two) times daily.   cholecalciferol (VITAMIN D) 25 MCG (1000 UNIT) tablet Take 1,000 Units by mouth daily.   ezetimibe (ZETIA) 10 MG tablet TAKE 1 TABLET BY MOUTH  DAILY (Patient taking differently: Take 10 mg by mouth daily.)   fluorometholone (FML) 0.1 % ophthalmic suspension Place 1  drop into both eyes daily.   furosemide (LASIX) 20 MG tablet Take 2 tablets (40 mg total) by mouth daily.   lisinopril (ZESTRIL) 20 MG tablet Take 1 tablet (20 mg total) by mouth daily.   pantoprazole (PROTONIX) 40 MG tablet TAKE 1 TABLET BY MOUTH DAILY (Patient taking differently: Take 40 mg by mouth daily.)   pregabalin (LYRICA) 75 MG capsule Take 1 capsule (75 mg total) by mouth 3 (three) times daily.   rosuvastatin (CRESTOR) 20 MG tablet Take 1 tablet (20 mg total) by mouth at bedtime.   sertraline (ZOLOFT) 50 MG tablet Take 50 mg by mouth daily.   vitamin B-12 (CYANOCOBALAMIN) 500 MCG tablet Take 500 mcg by mouth daily.   zinc sulfate 220 (50 Zn) MG capsule Take 1 capsule (220 mg total) by mouth daily.     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  He has one son that lives with him and one daughter that lives beside of him. He is close with his family.  His sister cooks meals for him regularly.  He has friends at the cafe he visits regularly.   Social Determinants of Health   Financial Resource Strain: Low Risk  (09/15/2021)   Overall Financial Resource Strain (CARDIA)    Difficulty of Paying Living Expenses: Not hard at all  Food Insecurity: No Food Insecurity (07/13/2020)   Hunger Vital Sign     Worried About Running Out of Food in the Last Year: Never true    Ran Out of Food in the Last Year: Never true  Transportation Needs: No Transportation Needs (09/15/2021)   PRAPARE - Transportation    Lack of Transportation (Medical): No    Lack  of Transportation (Non-Medical): No  Physical Activity: Insufficiently Active (07/13/2020)   Exercise Vital Sign    Days of Exercise per Week: 2 days    Minutes of Exercise per Session: 10 min  Stress: No Stress Concern Present (07/13/2020)   [place]    Feeling of Stress : Not at all  Social Connections: Moderately Isolated (07/13/2020)   Social Connection and Isolation Panel [NHANES]    Frequency of Communication with Friends and Family: More than three times a week    Frequency of Social Gatherings with Friends and Family: More than three times a week    Attends Religious Services: 1 to 4 times per year    Active Member of [employer] or Organizations: No    Attends Archivist Meetings: Never    Marital Status: Widowed     Family History: The patient's family history includes Arthritis in his brother and father; Cancer in his brother and mother; Diabetes in his mother; Hypertension in his father; Kidney disease in his mother; Migraines in his father; Sleep disorder in his father and mother; Stroke in his mother. ROS:   Please see the history of present illness.    All 14 point review of systems negative except as described per history of present illness  EKGs/Labs/Other Studies Reviewed:      Recent Labs: 01/11/2022: ALT 14; BUN 15; Creatinine, Ser 0.90; Hemoglobin 11.6; NT-Pro BNP 4,599; Platelets 186; Potassium 4.1; Sodium 142  Recent Lipid Panel    Component Value Date/Time   CHOL 126 10/11/2021 0916   TRIG 147 10/11/2021 0916   HDL 43 10/11/2021 0916   CHOLHDL 2.9 10/11/2021 0916   LDLCALC 58 10/11/2021 0916    Physical Exam:    VS:  BP (!) 150/78 (BP  Location: Right Arm, Patient Position: Sitting)   Pulse (!) 56   Ht _0  (1.676 m)   Wt 164 lb (74.4 kg)   SpO2 93%   BMI 26.47 kg/m     Wt Readings from Last 3 Encounters:  01/26/22 164 lb (74.4 kg)  01/24/22 160 lb (72.6 kg)  01/17/22 162 lb (73.5 kg)     GEN:  Well nourished, well developed in no acute distress HEENT: Normal NECK: No JVD; No carotid bruits LYMPHATICS: No lymphadenopathy CARDIAC: RRR, systolic murmur 7-8/4 best heard right upper portion of the sternum, no rubs, no gallops RESPIRATORY:  Clear to auscultation without rales, wheezing or rhonchi  ABDOMEN: Soft, non-tender, non-distended MUSCULOSKELETAL:  No edema; No deformity  SKIN: Warm and dry LOWER EXTREMITIES: no swelling NEUROLOGIC:  Alert and oriented x 3 PSYCHIATRIC:  Normal affect   ASSESSMENT:    1. S/P TAVR (transcatheter aortic valve replacement)   2. Coronary artery disease involving native coronary artery of native heart without angina pectoris   3. Hypertensive heart disease with heart failure ([place])   4. Nonrheumatic mitral valve regurgitation    PLAN:    In order of problems listed above:  Congestive heart failure diastolic in nature now he looks compensated on physical exam.  Shortness of breath improved, no swelling of lower extremities.  I suspect the etiology of his problem was elevation of blood pressure which is much better but still not at target.  We will recheck blood pressure before he leaves the room, will check Chem-7 if Chem-7 is fine we will increase dose of his lisinopril. Coronary disease stable denies having any signs and symptoms of reactivation of the problem  Essential hypertension: Still not well controlled plan as described above Nonrheumatic mitral valve regurgitation.  Will repeat echocardiogram later to reassess that but I do not think we have worsening of the valve because of her groin crease index is secondary to high blood pressure. Dyslipidemia I did review K PN  which show me LDL 58 HDL 43 this is from July of this year.   Medication Adjustments/Labs and Tests Ordered: Current medicines are reviewed at length with the patient today.  Concerns regarding medicines are outlined above.  No orders of the defined types were placed in this encounter.  Medication changes: No orders of the defined types were placed in this encounter.   Signed, Hodges Liter, MD, [facility] 01/26/2022 1:11 PM    [place]

## 2022-01-26 NOTE — Patient Instructions (Signed)
Medication Instructions:  Your physician recommends that you continue on your current medications as directed. Please refer to the Current Medication list given to you today.  *If you need a refill on your cardiac medications before your next appointment, please call your pharmacy*   Lab Work: Your physician recommends that you return for lab work in: BMP today If you have labs (blood work) drawn today and your tests are completely normal, you will receive your results only by: Jack Hodges (if you have MyChart) OR A paper copy in the mail If you have any lab test that is abnormal or we need to change your treatment, we will call you to review the results.   Testing/Procedures: None   Follow-Up: At [facility], you and your health needs are our priority.  As part of our continuing mission to provide you with exceptional heart care, we have created designated Provider Care Teams.  These Care Teams include your primary Cardiologist (physician) and Advanced Practice Providers (APPs -  Physician Assistants and Nurse Practitioners) who all work together to provide you with the care you need, when you need it.  We recommend signing up for the patient portal called "MyChart".  Sign up information is provided on this After Visit Summary.  MyChart is used to connect with patients for Virtual Visits (Telemedicine).  Patients are able to view lab/test results, encounter notes, upcoming appointments, etc.  Non-urgent messages can be sent to your provider as well.   To learn more about what you can do with MyChart, go to [email or web address].    Your next appointment:   3 month(s)  The format for your next appointment:   In Person  Provider:   Jenne Campus, MD    Other Instructions   Important Information About Sugar

## 2022-01-26 NOTE — Addendum Note (Signed)
Addended by: Darrel Reach on: 01/26/2022 01:19 PM   Modules accepted: Orders

## 2022-01-27 LAB — BASIC METABOLIC PANEL
BUN/Creatinine Ratio: 16 (ref 10–24)
BUN: 17 mg/dL (ref 8–27)
CO2: 24 mmol/L (ref 20–29)
Calcium: 9.1 mg/dL (ref 8.6–10.2)
Chloride: 106 mmol/L (ref 96–106)
Creatinine, Ser: 1.04 mg/dL (ref 0.76–1.27)
Glucose: 87 mg/dL (ref 70–99)
Potassium: 4.1 mmol/L (ref 3.5–5.2)
Sodium: 144 mmol/L (ref 134–144)
eGFR: 69 mL/min/{1.73_m2} (ref >59)

## 2022-02-02 NOTE — Telephone Encounter (Signed)
Called on 02/02/22 and spoke with son and reported he takes in the am and pm. He stated his dad has been taking Amlodipine 10mg .  01/30/22 152/64, 132/57 01/31/22 160/66, 150/57 02/01/22 162/52, 139/56 02/02/22 166/71  13/10/23, CMA Clinical Pharmacist Assistant  [phone]

## 2022-02-07 ENCOUNTER — Telehealth: Payer: Self-pay

## 2022-02-07 ENCOUNTER — Other Ambulatory Visit: Payer: Self-pay

## 2022-02-07 DIAGNOSIS — I10 Essential (primary) hypertension: Secondary | ICD-10-CM

## 2022-02-07 MED ORDER — LISINOPRIL 40 MG PO TABS: 40.0000 mg | ORAL_TABLET | Freq: Every day | ORAL | 3 refills | Status: DC

## 2022-02-07 MED ORDER — FUROSEMIDE 20 MG PO TABS: 40.0000 mg | ORAL_TABLET | Freq: Every day | ORAL | 2 refills | Status: DC

## 2022-02-07 NOTE — Telephone Encounter (Signed)
Results reviewed with pt as per Dr. Vanetta Shawl note. Pt agreed to double Lisinopril to 40mg  daily and get a BMP in 1 week.  Pt verbalized understanding and had no additional questions. Routed to PCP

## 2022-02-13 ENCOUNTER — Ambulatory Visit (INDEPENDENT_AMBULATORY_CARE_PROVIDER_SITE_OTHER): Payer: Medicare Other | Admitting: Legal Medicine

## 2022-02-13 ENCOUNTER — Encounter: Payer: Self-pay | Admitting: Legal Medicine

## 2022-02-13 VITALS — BP 138/82 | HR 52 | Temp 97.5°F | Ht 66.0 in | Wt 169.0 lb

## 2022-02-13 DIAGNOSIS — M25551 Pain in right hip: Secondary | ICD-10-CM

## 2022-02-13 DIAGNOSIS — S61511A Laceration without foreign body of right wrist, initial encounter: Secondary | ICD-10-CM

## 2022-02-13 DIAGNOSIS — R600 Localized edema: Secondary | ICD-10-CM

## 2022-02-13 HISTORY — DX: Laceration without foreign body of right wrist, initial encounter: S61.511A

## 2022-02-13 MED ORDER — TRIAMCINOLONE ACETONIDE 40 MG/ML IJ SUSP: 60.0000 mg | Freq: Once | INTRAMUSCULAR | Status: DC

## 2022-02-13 MED ORDER — FUROSEMIDE 40 MG PO TABS: 40.0000 mg | ORAL_TABLET | Freq: Two times a day (BID) | ORAL | 3 refills | Status: DC

## 2022-02-13 NOTE — Progress Notes (Signed)
+  Acute Office Visit  Subjective:    Patient ID: Jack Hodges, male    DOB: May 01, 1930, 88 y.o.   MRN: [id number]  Chief Complaint  Patient presents with   Right hip pain    HPI Patient is in today for Right hip pain that has been worsening over several weeks now. Patient states he has had an injection in his right hip before but it has been years ago. Orthopedics told him he was too old for THA. Patient fell one week ago on chair, 5cm right forearm it is dressed, no evidence for infection. Past Medical History:  Diagnosis Date   Acute bursitis of left shoulder 01/04/2020   Acute on chronic diastolic congestive heart failure ([place]) 11/13/2021   Last Assessment & Plan:  Formatting of this note is different from the original. Pertinent Data:   Current medication includes: furosemide - 20 mg lisinopriL - 10 mg metoprolol succinate - 25 mg.  BNP (pg/mL)  Date Value  11/15/2021 433.6 (H)   eGFR (mL/min/1.58m2)  Date Value  11/15/2021 >60.0    BP Readings from Last 3 Encounters:  11/30/21 (!) 147/82  11/15/21 (!) 109/54  11/13/21 (!) 134/74     Acute respiratory failure with hypoxia (HCC) 03/17/2020   Angina pectoris ([facility] [place] 10/07/2019   Arthritis    Atherosclerosis of aorta ([facility] 09/24/2019   Bilateral foot-drop 01/18/2017   BMI 27.0-27.9,adult 09/24/2019   Coronary artery disease involving native coronary artery of native heart without angina pectoris 11/01/2015   Overview:  Bypass surgery in 1996 Overview:  Overview:  Bypass surgery in [place]  Continue his current regimen.  Follow up as noted.   CVA (cerebral vascular accident) ([place] [place] 04/03/2016   Last Assessment & Plan:  The patient underwent extensive CVA workup.  MRI is negative.  He does have aortic stenosis by echocardiogram.  Upon review of Care everywhere he is followed by Dr. KAgustin Cree cardiologist for this.  Carotid ultrasound negative for hemodynamically significant stenosis.  Lipid panel within normal  limits.  He has not had any further episodes of dizziness or nausea vomiting.    Demand ischemia 11/14/2021   Last Assessment & Plan:  Formatting of this note might be different from the original. Troponin flat No anginal symptoms Formatting of this note might be different from the original. Last Assessment & Plan:  Formatting of this note might be different from the original. Troponin flat No anginal symptoms   Dementia ([facility]    Depression    DNR (do not resuscitate) 04/03/2016   Overview:  I had an extensive discussion with the patient on 04/03/2016 regarding his end of life wishes.  He and his daughter agree that do not resuscitate is in keeping with his beliefs.   Dyslipidemia 11/01/2015   Last Assessment & Plan:  Formatting of this note might be different from the original. Cholesterol 136   Triglycerides  83  HDL  50.1  LDL Calculated  69  VLDL Cholesterol Cal  17   Continue statin.   Dyspnea    with activity   Essential hypertension 11/01/2015   Last Assessment & Plan:  Resume home medications.  Follow-up as noted above.   Generalized weakness 03/13/2020   GERD (gastroesophageal reflux disease)    Herpes zoster 05/19/2019   History of aortic stenosis 03/11/2020   History of CVA (cerebrovascular accident) 11/14/2021   Last Assessment & Plan:  Formatting of this note might be different from the original.  Neurologically intact upon assessment -continue aspirin, statin   Hypertensive heart disease with heart failure ([place]) 11/01/2015   Last Assessment & Plan:  Resume home medications.  Follow-up as noted above.   Myocardial infarction ([place])    approx 1994   Neuropathy    Peripheral neuropathy 11/14/2021   Last Assessment & Plan:  Formatting of this note might be different from the original. Bilateral lower extremity cool to touch with diminished sensation Patient reports findings are chronic Continue home dose Lyrica Formatting of this note might be different from the original. Last Assessment  & Plan:  Formatting of this note might be different from the original. 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   Thrombocytopenia ([place]) 03/13/2020   Trochanteric bursitis of left hip 01/19/2021    Past Surgical History:  Procedure Laterality Date   CARDIAC CATHETERIZATION     CARDIAC SURGERY     CATARACT EXTRACTION, BILATERAL     CORONARY ARTERY BYPASS GRAFT     CORONARY STENT INTERVENTION N/A 10/07/2019   Procedure: CORONARY STENT INTERVENTION;  Surgeon: Sherren Mocha, MD;  Location: [facility];  Service: Cardiovascular;  Laterality: N/A;   EYE SURGERY     HEMORROIDECTOMY     RIGHT/LEFT HEART CATH AND CORONARY/GRAFT ANGIOGRAPHY N/A 10/07/2019   Procedure: RIGHT/LEFT HEART CATH AND CORONARY/GRAFT ANGIOGRAPHY;  Surgeon: Sherren Mocha, MD;  Location: [facility];  Service: Cardiovascular;  Laterality: N/A;   TEE WITHOUT CARDIOVERSION N/A 10/27/2019   Procedure: TRANSESOPHAGEAL ECHOCARDIOGRAM (TEE);  Surgeon: Burnell Blanks, MD;  Location: [facility];  Service: Open Heart Surgery;  Laterality: N/A;   TRANSCATHETER AORTIC VALVE REPLACEMENT, TRANSFEMORAL N/A 10/27/2019   Procedure: TRANSCATHETER AORTIC VALVE REPLACEMENT, TRANSFEMORAL;  Surgeon: Burnell Blanks, MD;  Location: [facility];  Service: Open Heart Surgery;  Laterality: N/A;    Family History  Problem Relation Age of Onset   Cancer Mother    Diabetes Mother    Kidney disease Mother    Stroke Mother    Sleep disorder Mother     Arthritis Father    Migraines Father    Hypertension Father    Sleep disorder Father    Arthritis Brother    Cancer Brother     Social History   Socioeconomic History   Marital status: Widowed    Spouse name: Curly [place]   Number of children: 2   Years of education: Not on file   Highest education level: Not on file  Occupational History   Occupation: Retired    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  He has one son that lives with him and one daughter that lives beside of him. He is close with his family.  His sister cooks meals for him regularly.  He has friends at the cafe he visits regularly.   Social Determinants of Health   Financial Resource Strain: Low Risk  (09/15/2021)   Overall Financial Resource Strain (CARDIA)    Difficulty of Paying Living Expenses: Not hard at all  Food Insecurity: No Food Insecurity (07/13/2020)   Hunger Vital Sign    Worried About Running Out of Food in the Last Year: Never true    Ran Out of Food in the Last Year: Never true  Transportation Needs: No Transportation Needs (09/15/2021)   PRAPARE - Hydrologist (Medical): No    Lack of Transportation (Non-Medical): No  Physical Activity: Insufficiently Active (07/13/2020)   Exercise Vital Sign    Days of Exercise per Week: 2 days    Minutes of Exercise per Session: 10 min  Stress: No Stress Concern Present (07/13/2020)   [place]    Feeling of Stress : Not at all  Social Connections:  Moderately Isolated (07/13/2020)   Social Connection and Isolation Panel [NHANES]    Frequency of Communication with Friends and Family: More than three times a week    Frequency of Social Gatherings with Friends and Family: More than three times a week    Attends Religious Services: 1 to 4 times per year    Active Member of [employer] or Organizations: No    Attends Archivist Meetings: Never    Marital Status: Widowed  Intimate Partner Violence: Not At Risk (07/13/2020)   Humiliation, Afraid, Rape, and Kick questionnaire    Fear of Current or Ex-Partner: No    Emotionally Abused: No    Physically Abused: No    Sexually Abused: No    Outpatient Medications Prior to Visit  Medication Sig Dispense Refill   acetaminophen (TYLENOL) 650 MG CR tablet Take 650 mg by mouth every 8 (eight) hours as needed for pain.      albuterol (VENTOLIN HFA) 108 (90 Base) MCG/ACT inhaler Inhale 2 puffs into the lungs every 2 (two) hours as needed for wheezing or shortness of breath. 6.7 g 6   amLODipine (NORVASC) 10 MG tablet Take 10 mg by mouth daily.     ascorbic acid (VITAMIN C) 500 MG tablet Take 1 tablet (500 mg total) by mouth daily. 30 tablet 0   aspirin EC 81 MG tablet Take 81 mg by mouth daily.     carbidopa-levodopa (SINEMET) 25-100 MG tablet Take 1 tablet by mouth at bedtime as needed (rESTLESS LEGS.). 90 tablet 1   carvedilol (COREG) 12.5 MG tablet Take 1 tablet (12.5 mg total) by mouth 2 (two) times daily. 180 tablet 3   cholecalciferol (VITAMIN D) 25 MCG (1000 UNIT) tablet Take 1,000 Units by mouth daily.     ezetimibe (ZETIA) 10 MG tablet TAKE 1 TABLET BY MOUTH  DAILY (Patient taking differently: Take 10 mg by mouth daily.) 90 tablet 3   fluorometholone (FML) 0.1 % ophthalmic suspension Place 1 drop into both eyes daily.     lisinopril (ZESTRIL) 40 MG tablet Take 1 tablet (40 mg total) by mouth daily. 90 tablet 3   pantoprazole (PROTONIX) 40 MG tablet TAKE 1 TABLET BY MOUTH DAILY (Patient  taking differently: Take 40  mg by mouth daily.) 100 tablet 2   pregabalin (LYRICA) 75 MG capsule Take 1 capsule (75 mg total) by mouth 3 (three) times daily. 270 capsule 0   rosuvastatin (CRESTOR) 20 MG tablet Take 1 tablet (20 mg total) by mouth at bedtime. 90 tablet 3   sertraline (ZOLOFT) 50 MG tablet Take 50 mg by mouth daily.     vitamin B-12 (CYANOCOBALAMIN) 500 MCG tablet Take 500 mcg by mouth daily.     zinc sulfate 220 (50 Zn) MG capsule Take 1 capsule (220 mg total) by mouth daily. 30 capsule 0   furosemide (LASIX) 20 MG tablet Take 2 tablets (40 mg total) by mouth daily. 60 tablet 2   nitroGLYCERIN (NITROSTAT) 0.4 MG SL tablet Place 1 tablet (0.4 mg total) under the tongue every 5 (five) minutes as needed for chest pain. 25 tablet 2   No facility-administered medications prior to visit.    Allergies  Allergen Reactions   Poison Oak Extract Rash    Review of Systems  Constitutional:  Negative for appetite change, fatigue and fever.  HENT:  Negative for congestion, ear pain, sinus pressure and sore throat.   Eyes:  Negative for visual disturbance.  Respiratory:  Negative for cough, chest tightness, shortness of breath and wheezing.   Cardiovascular:  Negative for chest pain and palpitations.  Gastrointestinal:  Negative for abdominal pain, constipation, diarrhea, nausea and vomiting.  Genitourinary:  Negative for dysuria and hematuria.  Musculoskeletal:  Positive for myalgias (Right hip pain). Negative for arthralgias, back pain and joint swelling.  Skin:  Negative for rash.  Neurological:  Negative for dizziness, weakness and headaches.  Psychiatric/Behavioral:  Negative for dysphoric mood. The patient is not nervous/anxious.        Objective:    Physical Exam Vitals reviewed.  Constitutional:      Appearance: Normal appearance. He is normal weight.  HENT:     Head: Normocephalic.     Right Ear: Tympanic membrane normal.     Left Ear: Tympanic membrane normal.      Nose: Nose normal.     Mouth/Throat:     Mouth: Mucous membranes are moist.     Pharynx: Oropharynx is clear.  Eyes:     Extraocular Movements: Extraocular movements intact.     Conjunctiva/sclera: Conjunctivae normal.     Pupils: Pupils are equal, round, and reactive to light.  Cardiovascular:     Rate and Rhythm: Normal rate and regular rhythm.     Pulses: Normal pulses.     Heart sounds: Normal heart sounds. No murmur heard.    No gallop.  Pulmonary:     Effort: Pulmonary effort is normal. No respiratory distress.     Breath sounds: Normal breath sounds. No wheezing.  Abdominal:     General: Abdomen is flat. Bowel sounds are normal. There is no distension.     Palpations: Abdomen is soft.     Tenderness: There is no abdominal tenderness.  Musculoskeletal:        General: Normal range of motion.     Cervical back: Normal range of motion.     Right hip: Tenderness present.     Right lower leg: Edema present.     Left lower leg: Edema present.       Legs:     Comments: Pain posterior to joint  Skin:    Capillary Refill: Capillary refill takes less than 2 seconds.     Comments: Laceration of skin right wrist, not  bleeding.  redressed  Neurological:     General: No focal deficit present.     Mental Status: He is alert and oriented to person, place, and time. Mental status is at baseline.  Psychiatric:        Mood and Affect: Mood normal.        Behavior: Behavior normal.     BP 138/82 (BP Location: Left Arm, Patient Position: Sitting)   Pulse (!) 52   Temp (!) 97.5 F (36.4 C) (Temporal)   Ht _0  (1.676 m)   Wt 169 lb (76.7 kg)   SpO2 96%   BMI 27.28 kg/m  Wt Readings from Last 3 Encounters:  02/13/22 169 lb (76.7 kg)  01/26/22 164 lb (74.4 kg)  01/24/22 160 lb (72.6 kg)    Health Maintenance Due  Topic Date Due   Zoster Vaccines- Shingrix (2 of 2) 10/28/2020    There are no preventive care reminders to display for this patient.   No results found for:  "TSH" Lab Results  Component Value Date   WBC 10.0 01/11/2022   HGB 11.6 (L) 01/11/2022   HCT 36.0 (L) 01/11/2022   MCV 84 01/11/2022   PLT 186 01/11/2022   Lab Results  Component Value Date   NA 144 01/26/2022   K 4.1 01/26/2022   CO2 24 01/26/2022   GLUCOSE 87 01/26/2022   BUN 17 01/26/2022   CREATININE 1.04 01/26/2022   BILITOT 0.9 01/11/2022   ALKPHOS 103 01/11/2022   AST 23 01/11/2022   ALT 14 01/11/2022   PROT 6.1 01/11/2022   ALBUMIN 4.1 01/11/2022   CALCIUM 9.1 01/26/2022   ANIONGAP 8 03/17/2020   EGFR 69 01/26/2022   Lab Results  Component Value Date   CHOL 126 10/11/2021   Lab Results  Component Value Date   HDL 43 10/11/2021   Lab Results  Component Value Date   LDLCALC 58 10/11/2021   Lab Results  Component Value Date   TRIG 147 10/11/2021   Lab Results  Component Value Date   CHOLHDL 2.9 10/11/2021   Lab Results  Component Value Date   HGBA1C 4.8 10/23/2019         Assessment & Plan:  Pedal edema - Plan: furosemide (LASIX) 40 MG tablet Prescription increased to 55m for one week the n return to 478mqd.  Furosemide 2047miscontinued.  Laceration of skin of right wrist, initial encounter Laceration was rewrapped with silvadene and dressing.  Hip pain, right - Plan: triamcinolone acetonide (KENALOG-40) injection 60 mg PAIN IN LOW back and limited rotation right hip with pain.  Patient given 13m13m at point of worse pain.    I,Lauren M Auman,acting as a scribe for LawrReinaldo Meeker.,have documented all relevant documentation on the behalf of LawrReinaldo Meeker,as directed by  LawrReinaldo Meeker while in the presence of LawrReinaldo Meeker.   LawrReinaldo Meeker

## 2022-02-23 NOTE — Telephone Encounter (Signed)
02/23/22- Called lvm to return my call for BP readings

## 2022-02-26 NOTE — Telephone Encounter (Signed)
02/26/22- Called and BP readings are as follows:  Medications: Lisinopril 40mg  in AM Carvedilol 12.5mg  in PM. Not taking Amlodipine 10mg  per patient.  02/11/22 161/68 AM 179/86 PM 02/12/22 190/108 AM  02/13/22 188/69 AM 168/65 PM 02/14/22 175/76 AM 191/82 PM 02/15/22 188/77 AM 172/112 PM 02/16/22 179/64 AM 160/85 PM 02/17/22 187/71 AM 202/80 PM 02/18/22 172/72 AM 182/74 PM 02/19/22 176/65 AM 156/57 PM 02/19/22 208/171 AM 108/80 PM 02/21/22 186/74 AM 196/152 PM 02/22/22 170/68 AM 188/69 PM 02/23/22 200/64 AM 200/69 PM 02/24/22 153/63 AM 165/56 PM 02/25/22 174/75 AM 148/46 PM 02/26/22 166/67 AM    14/3/23, CMA Clinical Pharmacist Assistant  [phone]

## 2022-03-09 NOTE — Telephone Encounter (Signed)
Spoke with son, [name]. He stated that Dr. Marina Goodell had increased pts Lasix 40mg  to 2 tablets for 7 days then back to 1 daily. He continues on Carvedilol 12.5mg  BID, Lisinopril 40mg  daily, Amlodipine 5mg  daily. He stated that his blood pressure readings are up and down.

## 2022-03-19 ENCOUNTER — Other Ambulatory Visit: Payer: Self-pay | Admitting: Legal Medicine

## 2022-03-29 ENCOUNTER — Ambulatory Visit (INDEPENDENT_AMBULATORY_CARE_PROVIDER_SITE_OTHER): Payer: PPO | Admitting: Family Medicine

## 2022-03-29 VITALS — BP 140/90 | HR 60 | Temp 97.4°F | Resp 18 | Ht 66.0 in | Wt 165.0 lb

## 2022-03-29 DIAGNOSIS — R051 Acute cough: Secondary | ICD-10-CM

## 2022-03-29 DIAGNOSIS — I110 Hypertensive heart disease with heart failure: Secondary | ICD-10-CM | POA: Diagnosis not present

## 2022-03-29 DIAGNOSIS — J028 Acute pharyngitis due to other specified organisms: Secondary | ICD-10-CM | POA: Diagnosis not present

## 2022-03-29 LAB — POC COVID19 BINAXNOW

## 2022-03-29 LAB — POCT RAPID STREP A (OFFICE): Rapid Strep A Screen: NEGATIVE

## 2022-03-29 MED ORDER — VALSARTAN 160 MG PO TABS: 160.0000 mg | ORAL_TABLET | Freq: Every day | ORAL | 0 refills | Status: DC

## 2022-03-29 MED ORDER — AMOXICILLIN 875 MG PO TABS: 875.0000 mg | ORAL_TABLET | Freq: Two times a day (BID) | ORAL | 0 refills | Status: AC

## 2022-03-29 NOTE — Patient Instructions (Addendum)
Start on amoxicillin twice daily x 10 days.   Change lisinopril to valsartan 160 mg daily   Please let us know the name of the medicine you feel is making you sweat.

## 2022-03-29 NOTE — Progress Notes (Unsigned)
Acute Office Visit  Subjective:    Patient ID: Jack Hodges, male    DOB: 13-Jun-1930, 89 y.o.   MRN: [id number]  Chief Complaint  Patient presents with   Cough   Headache   Nasal Congestion   Sore Throat    HPI: Patient is in today with cough, congestion and sore throat which started about 48 hours ago.  Runny nose, itchy eyes and watering since Saturday.  Covid 19 negative.  He is having problems sleeping at night because of his cough.    Past Medical History:  Diagnosis Date   Acute bursitis of left shoulder 01/04/2020   Acute on chronic diastolic congestive heart failure ([place]) 11/13/2021   Last Assessment & Plan:  Formatting of this note is different from the original. Pertinent Data:   Current medication includes: furosemide - 20 mg lisinopriL - 10 mg metoprolol succinate - 25 mg.  BNP (pg/mL)  Date Value  11/15/2021 433.6 (H)   eGFR (mL/min/1.72m2)  Date Value  11/15/2021 >60.0    BP Readings from Last 3 Encounters:  11/30/21 (!) 147/82  11/15/21 (!) 109/54  11/13/21 (!) 134/74     Acute respiratory failure with hypoxia (HCC) 03/17/2020   Angina pectoris ([facility] 10/07/2019   Arthritis    Atherosclerosis of aorta ([place] 09/24/2019   Bilateral foot-drop 01/18/2017   BMI 27.0-27.9,adult 09/24/2019   Coronary artery disease involving native coronary artery of native heart without angina pectoris 11/01/2015   Overview:  Bypass surgery in 1996 Overview:  Overview:  Bypass surgery in 1[place]  Continue his current regimen.  Follow up as noted.   CVA (cerebral vascular accident) ([facility] 04/03/2016   Last Assessment & Plan:  The patient underwent extensive CVA workup.  MRI is negative.  He does have aortic stenosis by echocardiogram.  Upon review of Care everywhere he is followed by Dr. KAgustin Cree cardiologist for this.  Carotid ultrasound negative for hemodynamically significant stenosis.  Lipid panel within normal limits.  He has not had any further episodes of  dizziness or nausea vomiting.    Demand ischemia 11/14/2021   Last Assessment & Plan:  Formatting of this note might be different from the original. Troponin flat No anginal symptoms Formatting of this note might be different from the original. Last Assessment & Plan:  Formatting of this note might be different from the original. Troponin flat No anginal symptoms   Dementia ([facility]    Depression    DNR (do not resuscitate) 04/03/2016   Overview:  I had an extensive discussion with the patient on 04/03/2016 regarding his end of life wishes.  He and his daughter agree that do not resuscitate is in keeping with his beliefs.   Dyslipidemia 11/01/2015   Last Assessment & Plan:  Formatting of this note might be different from the original. Cholesterol 136   Triglycerides  83  HDL  50.1  LDL Calculated  69  VLDL Cholesterol Cal  17   Continue statin.   Dyspnea    with activity   Essential hypertension 11/01/2015   Last Assessment & Plan:  Resume home medications.  Follow-up as noted above.   Generalized weakness 03/13/2020   GERD (gastroesophageal reflux disease)    Herpes zoster 05/19/2019   History of aortic stenosis 03/11/2020   History of CVA (cerebrovascular accident) 11/14/2021   Last Assessment & Plan:  Formatting of this note might be different from the original. Neurologically intact upon assessment -continue aspirin, statin  Hypertensive heart disease with heart failure ([place]) 11/01/2015   Last Assessment & Plan:  Resume home medications.  Follow-up as noted above.   Myocardial infarction ([place])    approx 1994   Neuropathy    Peripheral neuropathy 11/14/2021   Last Assessment & Plan:  Formatting of this note might be different from the original. Bilateral lower extremity cool to touch with diminished sensation Patient reports findings are chronic Continue home dose Lyrica Formatting of this note might be different from the original. Last Assessment & Plan:  Formatting of this note might be  different from the original. 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   Thrombocytopenia ([place]) 03/13/2020   Trochanteric bursitis of left hip 01/19/2021    Past Surgical History:  Procedure Laterality Date   CARDIAC CATHETERIZATION     CARDIAC SURGERY     CATARACT EXTRACTION, BILATERAL     CORONARY ARTERY BYPASS GRAFT     CORONARY STENT INTERVENTION N/A 10/07/2019   Procedure: CORONARY STENT INTERVENTION;  Surgeon: Sherren Mocha, MD;  Location: [facility];  Service: Cardiovascular;  Laterality: N/A;   EYE SURGERY     HEMORROIDECTOMY     RIGHT/LEFT HEART CATH AND CORONARY/GRAFT ANGIOGRAPHY N/A 10/07/2019   Procedure: RIGHT/LEFT HEART CATH AND CORONARY/GRAFT ANGIOGRAPHY;  Surgeon: Sherren Mocha, MD;  Location: [facility];  Service: Cardiovascular;  Laterality: N/A;   TEE WITHOUT CARDIOVERSION N/A 10/27/2019   Procedure: TRANSESOPHAGEAL ECHOCARDIOGRAM (TEE);  Surgeon: Burnell Blanks, MD;  Location: [facility];  Service: Open Heart Surgery;  Laterality: N/A;   TRANSCATHETER AORTIC VALVE REPLACEMENT, TRANSFEMORAL N/A 10/27/2019   Procedure: TRANSCATHETER AORTIC VALVE REPLACEMENT, TRANSFEMORAL;  Surgeon: Burnell Blanks, MD;  Location: [facility];  Service: Open Heart Surgery;  Laterality: N/A;    Family History  Problem Relation Age of Onset   Cancer Mother    Diabetes Mother    Kidney disease Mother    Stroke Mother    Sleep disorder Mother    Arthritis Father    Migraines Father     Hypertension Father    Sleep disorder Father    Arthritis Brother    Cancer Brother     Social History   Socioeconomic History   Marital status: Widowed    Spouse name: Curly [place]   Number of children: 2   Years of education: Not on file   Highest education level: Not on file  Occupational History   Occupation: Retired    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  He has one son that lives with him and one daughter that lives beside of him. He is close with his family.  His sister cooks meals for him regularly.  He has friends at the cafe he visits regularly.   Social Determinants of Health   Financial Resource Strain: Low Risk  (09/15/2021)   Overall Financial Resource Strain (CARDIA)    Difficulty of Paying Living Expenses: Not hard at all  Food Insecurity: No Food Insecurity (07/13/2020)   Hunger Vital Sign    Worried About Running Out of Food in the Last Year: Never true    Ran Out of Food in the Last Year: Never true  Transportation Needs: No Transportation Needs (09/15/2021)   PRAPARE - Hydrologist (Medical): No    Lack of Transportation (Non-Medical): No  Physical Activity: Insufficiently Active (07/13/2020)   Exercise Vital Sign    Days of Exercise per Week: 2 days    Minutes of Exercise per Session: 10 min  Stress: No Stress Concern Present (07/13/2020)   [place]    Feeling of Stress : Not at all  Social Connections: Moderately Isolated (07/13/2020)   Social  Connection and Isolation Panel [NHANES]    Frequency of Communication with Friends and Family: More than three times a week    Frequency of Social Gatherings with Friends and Family: More than three times a week    Attends Religious Services: 1 to 4 times per year    Active Member of [employer] or Organizations: No    Attends Archivist Meetings: Never    Marital Status: Widowed  Intimate Partner Violence: Not At Risk (07/13/2020)   Humiliation, Afraid, Rape, and Kick questionnaire    Fear of Current or Ex-Partner: No    Emotionally Abused: No    Physically Abused: No    Sexually Abused: No    Outpatient Medications Prior to Visit  Medication Sig Dispense Refill   acetaminophen (TYLENOL) 650 MG CR tablet Take 650 mg by mouth every 8 (eight) hours as needed for pain.      albuterol (VENTOLIN HFA) 108 (90 Base) MCG/ACT inhaler Inhale 2 puffs into the lungs every 2 (two) hours as needed for wheezing or shortness of breath. 6.7 g 6   amLODipine (NORVASC) 10 MG tablet Take 10 mg by mouth daily.     ascorbic acid (VITAMIN C) 500 MG tablet Take 1 tablet (500 mg total) by mouth daily. 30 tablet 0   aspirin EC 81 MG tablet Take 81 mg by mouth daily.     carbidopa-levodopa (SINEMET) 25-100 MG tablet Take 1 tablet by mouth at bedtime as needed (rESTLESS LEGS.). 90 tablet 1   carvedilol (COREG) 12.5 MG tablet Take 1 tablet (12.5 mg total) by mouth 2 (two) times daily. 180 tablet 3   cholecalciferol (VITAMIN D) 25 MCG (1000 UNIT) tablet Take 1,000 Units by mouth daily.     ezetimibe (ZETIA) 10 MG tablet Take 1 tablet (10 mg total) by mouth daily. 90 tablet 1   fluorometholone (FML) 0.1 % ophthalmic suspension Place 1 drop into both eyes daily.     furosemide (LASIX) 40 MG tablet Take 1 tablet (40 mg total) by mouth 2 (two) times daily for 7 days. Then go back to one pill a day 30 tablet 3   nitroGLYCERIN (NITROSTAT) 0.4 MG SL tablet Place 1 tablet (0.4 mg total) under the tongue every 5 (five)  minutes  as needed for chest pain. 25 tablet 2   pantoprazole (PROTONIX) 40 MG tablet TAKE 1 TABLET BY MOUTH DAILY (Patient taking differently: Take 40 mg by mouth daily.) 100 tablet 2   pregabalin (LYRICA) 75 MG capsule Take 1 capsule (75 mg total) by mouth 3 (three) times daily. 270 capsule 0   rosuvastatin (CRESTOR) 20 MG tablet Take 1 tablet (20 mg total) by mouth at bedtime. 90 tablet 3   sertraline (ZOLOFT) 50 MG tablet Take 50 mg by mouth daily.     vitamin B-12 (CYANOCOBALAMIN) 500 MCG tablet Take 500 mcg by mouth daily.     zinc sulfate 220 (50 Zn) MG capsule Take 1 capsule (220 mg total) by mouth daily. 30 capsule 0   lisinopril (ZESTRIL) 40 MG tablet Take 1 tablet (40 mg total) by mouth daily. 90 tablet 3   Facility-Administered Medications Prior to Visit  Medication Dose Route Frequency Provider Last Rate Last Admin   triamcinolone acetonide (KENALOG-40) injection 60 mg  60 mg Intramuscular Once Lillard Anes, MD        Allergies  Allergen Reactions   Poison Oak Extract Rash    Review of Systems  Constitutional:  Negative for chills and fever.  HENT:  Positive for congestion and rhinorrhea. Negative for sore throat.   Eyes:  Positive for discharge and itching.  Respiratory:  Positive for cough and wheezing.   Neurological:  Positive for headaches.  Psychiatric/Behavioral:  Negative for dysphoric mood. The patient is not nervous/anxious.        Objective:    Physical Exam Vitals reviewed.  Constitutional:      Appearance: Normal appearance. He is well-developed.  HENT:     Right Ear: Tympanic membrane normal.     Left Ear: Tympanic membrane normal.     Nose: Rhinorrhea present. No congestion.     Mouth/Throat:     Pharynx: Posterior oropharyngeal erythema present. No oropharyngeal exudate.  Cardiovascular:     Rate and Rhythm: Normal rate and regular rhythm.     Heart sounds: Normal heart sounds.  Pulmonary:     Effort: Pulmonary effort is normal.      Breath sounds: Normal breath sounds.  Lymphadenopathy:     Cervical: No cervical adenopathy.  Neurological:     Mental Status: He is alert.     BP (!) 140/90   Pulse 60   Temp (!) 97.4 F (36.3 C)   Resp 18   Ht _0  (1.676 m)   Wt 165 lb (74.8 kg)   BMI 26.63 kg/m  Wt Readings from Last 3 Encounters:  03/29/22 165 lb (74.8 kg)  02/13/22 169 lb (76.7 kg)  01/26/22 164 lb (74.4 kg)    Health Maintenance Due  Topic Date Due   Zoster Vaccines- Shingrix (2 of 2) 10/28/2020    There are no preventive care reminders to display for this patient.   No results found for: "TSH" Lab Results  Component Value Date   WBC 10.0 01/11/2022   HGB 11.6 (L) 01/11/2022   HCT 36.0 (L) 01/11/2022   MCV 84 01/11/2022   PLT 186 01/11/2022   Lab Results  Component Value Date   NA 144 01/26/2022   K 4.1 01/26/2022   CO2 24 01/26/2022   GLUCOSE 87 01/26/2022   BUN 17 01/26/2022   CREATININE 1.04 01/26/2022   BILITOT 0.9 01/11/2022   ALKPHOS 103 01/11/2022   AST 23 01/11/2022   ALT 14 01/11/2022   PROT 6.1  01/11/2022   ALBUMIN 4.1 01/11/2022   CALCIUM 9.1 01/26/2022   ANIONGAP 8 03/17/2020   EGFR 69 01/26/2022   Lab Results  Component Value Date   CHOL 126 10/11/2021   Lab Results  Component Value Date   HDL 43 10/11/2021   Lab Results  Component Value Date   LDLCALC 58 10/11/2021   Lab Results  Component Value Date   TRIG 147 10/11/2021   Lab Results  Component Value Date   CHOLHDL 2.9 10/11/2021   Lab Results  Component Value Date   HGBA1C 4.8 10/23/2019       Assessment & Plan:   Problem List Items Addressed This Visit       Cardiovascular and Mediastinum   Hypertensive heart disease with heart failure (HCC)    Change lisinopril to valsartan 160 mg daily        Relevant Medications   valsartan (DIOVAN) 160 MG tablet     Respiratory   Acute pharyngitis due to other specified organisms    Start on amoxicillin twice daily x 10 days.         Relevant Medications   amoxicillin (AMOXIL) 875 MG tablet   Other Relevant Orders   POCT rapid strep A (Completed)     Other   Acute cough - Primary    Ordered rapid covid test and rapid strep test      Relevant Orders   POC COVID-19 (Completed)   Meds ordered this encounter  Medications   amoxicillin (AMOXIL) 875 MG tablet    Sig: Take 1 tablet (875 mg total) by mouth 2 (two) times daily for 10 days.    Dispense:  20 tablet    Refill:  0   valsartan (DIOVAN) 160 MG tablet    Sig: Take 1 tablet (160 mg total) by mouth daily.    Dispense:  90 tablet    Refill:  0    Orders Placed This Encounter  Procedures   POC COVID-19   POCT rapid strep A     Follow-up: Return in about 3 months (around 06/28/2022) for chronic fasting.  An After Visit Summary was printed and given to the patient.  I,Tehilla Coffel,acting as a Education administrator for Rochel Brome, MD.,have documented all relevant documentation on the behalf of Rochel Brome, MD,as directed by  Rochel Brome, MD while in the presence of Rochel Brome, MD.   Rochel Brome, MD [place] [phone]

## 2022-03-31 DIAGNOSIS — R051 Acute cough: Secondary | ICD-10-CM | POA: Insufficient documentation

## 2022-03-31 DIAGNOSIS — J028 Acute pharyngitis due to other specified organisms: Secondary | ICD-10-CM | POA: Insufficient documentation

## 2022-03-31 HISTORY — DX: Acute pharyngitis due to other specified organisms: J02.8

## 2022-03-31 NOTE — Assessment & Plan Note (Signed)
Change lisinopril to valsartan 160 mg daily

## 2022-03-31 NOTE — Assessment & Plan Note (Signed)
Ordered rapid covid test and rapid strep test

## 2022-03-31 NOTE — Assessment & Plan Note (Signed)
Start on amoxicillin twice daily x 10 days.

## 2022-04-02 ENCOUNTER — Encounter: Payer: Self-pay | Admitting: Family Medicine

## 2022-04-03 ENCOUNTER — Other Ambulatory Visit: Payer: Self-pay | Admitting: Family Medicine

## 2022-04-03 ENCOUNTER — Telehealth: Payer: Self-pay

## 2022-04-03 MED ORDER — PREGABALIN 150 MG PO CAPS: 150.0000 mg | ORAL_CAPSULE | Freq: Three times a day (TID) | ORAL | 0 refills | Status: DC

## 2022-04-03 NOTE — Telephone Encounter (Signed)
-----   Message from Eulis Canner sent at 04/03/2022  3:26 PM EST ----- Regarding: Script Good afternoon,  Pt called and stated there was supposed to be a medication called in for his Neuropathy. He advised nothing was called in at [facility]. Please advise   Elray Mcgregor, [place] Pharmacist Assistant  [phone]

## 2022-04-04 NOTE — Telephone Encounter (Cosign Needed)
Pt was made aware today 04/04/22 and will pick that up tomorrow.    Elray Mcgregor, [place] Pharmacist Assistant  [phone]

## 2022-04-05 ENCOUNTER — Telehealth: Payer: Medicare Other

## 2022-04-05 ENCOUNTER — Other Ambulatory Visit: Payer: Self-pay | Admitting: Family Medicine

## 2022-04-05 MED ORDER — PREGABALIN 75 MG PO CAPS: 75.0000 mg | ORAL_CAPSULE | Freq: Three times a day (TID) | ORAL | 1 refills | Status: DC

## 2022-04-06 ENCOUNTER — Ambulatory Visit: Payer: PPO

## 2022-04-06 VITALS — BP 168/60

## 2022-04-06 DIAGNOSIS — I110 Hypertensive heart disease with heart failure: Secondary | ICD-10-CM

## 2022-04-06 NOTE — Progress Notes (Signed)
Dr Tobie Poet recommended to take Valsartan 160 mg twice a day. Follow up in 3-4 weeks. He denies headache, SOB or chest pain. Patient verbalized to understand.

## 2022-04-12 ENCOUNTER — Ambulatory Visit: Payer: PPO

## 2022-04-12 NOTE — Patient Outreach (Signed)
Care Management & Coordination Services Pharmacy Note  04/12/2022 Name:  Jack Hodges MRN:  [id number] DOB:  1930-05-07    Recommendations/Changes made from today's visit: -Patient would like to try [facility]. Spoke with son after speaking with patient, son agreed as well. (Forgot to pick up Metoprolol in 2023 and he hasn't taken Amlodipine all this month because he thought he was supposed to stop) -BP significantly elevated, sent direct msg to Cardio and forwarded note as well. Dr. Raliegh Ip responded, "Thanks" at 1539 via direct msg. Will defer to his judgement   Subjective: Jack Hodges is an 89 y.o. year old male who is a primary patient of Cox, Kirsten, MD.  The care coordination team was consulted for assistance with disease management and care coordination needs.    Engaged with patient by telephone for follow up visit.  Recent office visits:  01/17/22 Jack Brome MD. Seen for HTN. No med changes.   01/11/22 Jack Belfast NP. Seen for CHF. Increased Lisinopril to 20mg  daily.   Recent consult visits:  01/16/22 (Cardiology) Jack Forest RN. Started Amlodipine Besylate 5mg  daily and Carvedilol 12.5 mg twice daily. D/C Metoprolol Succinate 25mg .    01/09/22 (Cardiology) Jack Heinz MD. Seen for CAD. No med changes.   12/11/21 (Vascular Surgery) Jack Beards MD. Seen for Subclavian Steal Syndrome. No med changes.   Hospital visits:  Medication Reconciliation was completed by comparing discharge summary, patient's EMR and Pharmacy list, and upon discussion with patient.   Admitted to the hospital on 01/15/22 due to SOB. Discharge date was 01/15/22. Discharged from [facility].     -All medications will remain the same.      Medication Reconciliation was completed by comparing discharge summary, patient's EMR and Pharmacy list, and upon discussion with patient.   Admitted to the hospital on 01/07/22 due to Acute Exacerbation. Discharge date was 01/07/22.  Discharged from [facility].     -All medications will remain the same.     12/22/21- No notes available    Objective:  Lab Results  Component Value Date   CREATININE 1.04 01/26/2022   BUN 17 01/26/2022   EGFR 69 01/26/2022   GFRNONAA 78 05/09/2020   GFRAA 90 05/09/2020   NA 144 01/26/2022   K 4.1 01/26/2022   CALCIUM 9.1 01/26/2022   CO2 24 01/26/2022   GLUCOSE 87 01/26/2022    Lab Results  Component Value Date/Time   HGBA1C 4.8 10/23/2019 09:55 AM    Last diabetic Eye exam: No results found for: "HMDIABEYEEXA"  Last diabetic Foot exam: No results found for: "HMDIABFOOTEX"   Lab Results  Component Value Date   CHOL 126 10/11/2021   HDL 43 10/11/2021   LDLCALC 58 10/11/2021   TRIG 147 10/11/2021   CHOLHDL 2.9 10/11/2021       Latest Ref Rng & Units 01/11/2022   11:38 AM 12/01/2021   12:20 PM 10/11/2021    9:16 AM  Hepatic Function  Total Protein 6.0 - 8.5 g/dL 6.1  6.5  6.4   Albumin 3.7 - 4.7 g/dL 4.1  4.5  4.4   AST 0 - 40 IU/L 23  21  21    ALT 0 - 44 IU/L 14  16  11    Alk Phosphatase 44 - 121 IU/L 103  79  85   Total Bilirubin 0.0 - 1.2 mg/dL 0.9  0.8  0.6     No results found for: "TSH", "FREET4"     Latest Ref Rng &  Units 01/11/2022   11:38 AM 12/01/2021   12:20 PM 10/11/2021    9:16 AM  CBC  WBC 3.4 - 10.8 x10E3/uL 10.0  10.7  8.7   Hemoglobin 13.0 - 17.7 g/dL 11.6  13.1  13.0   Hematocrit 37.5 - 51.0 % 36.0  43.6  40.8   Platelets 150 - 450 x10E3/uL 186  171  185     Lab Results  Component Value Date/Time   VITAMINB12 1,420 (H) 03/16/2020 12:52 AM   VITAMINB12 1,149 09/24/2019 08:50 AM    Clinical ASCVD: Yes  The ASCVD Risk score (Arnett DK, et al., 2019) failed to calculate for the following reasons:   The 2019 ASCVD risk score is only valid for ages 34 to 64   The patient has a prior MI or stroke diagnosis    Other: (CHADS2VASc if Afib, MMRC or CAT for COPD, ACT, DEXA)     01/24/2022    8:26 PM 10/11/2021    8:43 AM  08/01/2021    2:17 PM  Depression screen PHQ 2/9  Decreased Interest 2 2 0  Down, Depressed, Hopeless 2 2 0  PHQ - 2 Score 4 4 0  Altered sleeping 2 3   Tired, decreased energy 1 2   Change in appetite 2 2   Feeling bad or failure about yourself  1 3   Trouble concentrating 0 0   Moving slowly or fidgety/restless 0 0   Suicidal thoughts 0 0   PHQ-9 Score 10 14   Difficult doing work/chores Not difficult at all Somewhat difficult      Social History   Tobacco Use  Smoking Status Former   Types: Cigarettes, Cigars   Quit date: 05/02/1962   Years since quitting: 59.9  Smokeless Tobacco Never   BP Readings from Last 3 Encounters:  04/06/22 (!) 168/60  03/29/22 (!) 140/90  02/13/22 138/82   Pulse Readings from Last 3 Encounters:  03/29/22 60  02/13/22 (!) 52  01/26/22 (!) 56   Wt Readings from Last 3 Encounters:  03/29/22 165 lb (74.8 kg)  02/13/22 169 lb (76.7 kg)  01/26/22 164 lb (74.4 kg)   BMI Readings from Last 3 Encounters:  03/29/22 26.63 kg/m  02/13/22 27.28 kg/m  01/26/22 26.47 kg/m    Allergies  Allergen Reactions   Poison Oak Extract Rash    Medications Reviewed Today     Reviewed by Jack Brome, MD (Physician) on 04/02/22 at Jack Hodges List Status: <None>   Medication Order Taking? Sig Documenting Provider Last Dose Status Informant  acetaminophen (TYLENOL) 650 MG CR tablet [id number] No Take 650 mg by mouth every 8 (eight) hours as needed for pain.  [provider] Taking Active Self  albuterol (VENTOLIN HFA) 108 (90 Base) MCG/ACT inhaler [id number] No Inhale 2 puffs into the lungs every 2 (two) hours as needed for wheezing or shortness of breath. Jack Anes, MD Taking Active   amLODipine (NORVASC) 10 MG tablet [id number] No Take 10 mg by mouth daily. [provider] Taking Active   amoxicillin (AMOXIL) 875 MG tablet [id number] Yes Take 1 tablet (875 mg total) by mouth 2 (two) times daily for 10 days. Cox, Kirsten, MD   Active   ascorbic acid (VITAMIN C) 500 MG tablet [id number] No Take 1 tablet (500 mg total) by mouth daily. Jack Bossier, MD Taking Active   aspirin EC 81 MG tablet [id number] No Take 81 mg by mouth daily. [provider] Taking Active Self  carbidopa-levodopa (SINEMET) 25-100 MG tablet [id number] No Take 1 tablet by mouth at bedtime as needed (rESTLESS LEGS.). Cox, Kirsten, MD Taking Active   carvedilol (COREG) 12.5 MG tablet [id number] No Take 1 tablet (12.5 mg total) by mouth 2 (two) times daily. Richardo Priest, MD Taking Active   cholecalciferol (VITAMIN D) 25 MCG (1000 UNIT) tablet [id number] No Take 1,000 Units by mouth daily. [provider] Taking Active   ezetimibe (ZETIA) 10 MG tablet [id number]  Take 1 tablet (10 mg total) by mouth daily. Cox, Kirsten, MD  Active   fluorometholone (FML) 0.1 % ophthalmic suspension [id number] No Place 1 drop into both eyes daily. [provider] Taking Active   furosemide (LASIX) 40 MG tablet [id number]  Take 1 tablet (40 mg total) by mouth 2 (two) times daily for 7 days. Then go back to one pill a day Jack Anes, MD  Expired 02/20/22 2359   nitroGLYCERIN (NITROSTAT) 0.4 MG SL tablet [id number] No Place 1 tablet (0.4 mg total) under the tongue every 5 (five) minutes as needed for chest pain. Darreld Mclean, PA-C Taking Expired 01/16/22 2359 Self  pantoprazole (PROTONIX) 40 MG tablet [id number] No TAKE 1 TABLET BY MOUTH DAILY  Patient taking differently: Take 40 mg by mouth daily.   Cox, Kirsten, MD Taking Active   pregabalin (LYRICA) 75 MG capsule [id number] No Take 1 capsule (75 mg total) by mouth 3 (three) times daily. Cox, Kirsten, MD Taking Active   rosuvastatin (CRESTOR) 20 MG tablet [id number] No Take 1 tablet (20 mg total) by mouth at bedtime. Jack Anes, MD Taking Active   sertraline (ZOLOFT) 50 MG tablet [id number] No Take 50 mg by mouth daily. [provider] Taking Active   triamcinolone  acetonide (KENALOG-40) injection 60 mg [id number]   Jack Anes, MD  Active   valsartan (DIOVAN) 160 MG tablet [id number] Yes Take 1 tablet (160 mg total) by mouth daily. Cox, Kirsten, MD  Active   vitamin B-12 (CYANOCOBALAMIN) 500 MCG tablet [id number] No Take 500 mcg by mouth daily. [provider] Taking Active   zinc sulfate 220 (50 Zn) MG capsule [id number] No Take 1 capsule (220 mg total) by mouth daily. Jack Bossier, MD Taking Active             SDOH:  (Social Determinants of Health) assessments and interventions performed: Yes SDOH Interventions    Flowsheet Row Clinical Support from 01/24/2022 in [place] Visit from 10/11/2021 in [place] Management from 09/15/2021 in [place] Management from 01/26/2021 in [place] from 07/13/2020 in [place] Interventions       Food Insecurity Interventions -- -- -- -- Intervention Not Indicated  Housing Interventions -- -- -- -- Intervention Not Indicated  Transportation Interventions -- -- Intervention Not Indicated -- Intervention Not Indicated  Depression Interventions/Treatment  Currently on Treatment Currently on Treatment -- -- --  Financial Strain Interventions -- -- Intervention Not Indicated Intervention Not Indicated Intervention Not Indicated  Physical Activity Interventions -- -- -- -- Intervention Not Indicated  Stress Interventions -- -- -- -- Intervention Not Indicated  Social Connections Interventions -- -- -- -- Intervention Not Indicated      SDOH Screenings   Food Insecurity: No Food Insecurity (07/13/2020)  Housing: Low Risk  (07/13/2020)  Transportation Needs: No Transportation Needs (09/15/2021)  Alcohol Screen: Low Risk  (07/13/2020)  Depression (PHQ2-9): Medium Risk (01/24/2022)  Financial Resource Strain: Low Risk  (09/15/2021)  Physical Activity: Insufficiently Active (07/13/2020)  Social  Connections: Moderately Isolated (07/13/2020)  Stress: No Stress Concern Present (07/13/2020)  Tobacco Use: Medium Risk (04/02/2022)    Medication Assistance: None required.  Patient affirms current coverage meets needs.    [facility] Reviewed: Is patient disadvantaged to use [facility]?: No  Current Rx insurance plan: HTA Name and location of Current pharmacy:  [facility] - [address] - [place], [place] - [address] [place] [address] Phone: [phone] Fax: [phone]  [facility]) - [place], [place] - [address] [facility] [address] [place] [address] [place] [address] Phone: [phone] Fax: [phone]  [facility] [address] [place] [address] Phone: [phone] Fax: [phone]  [facility] - [place], [place] - [address] [place]  [address] Phone: [phone] Fax: [phone]  [facility] services reviewed with patient today?: Yes  Patient requests to transfer care to [facility]?: Yes  -Verbal consent obtained for [facility] enhanced pharmacy services (medication synchronization, adherence packaging, delivery coordination). A medication sync plan was created to allow patient to get all medications delivered once every 30 to 90 days per patient preference. Patient understands they have freedom to choose pharmacy and clinical pharmacist will coordinate care between all prescribers and [facility].   Compliance/Adherence/Medication fill history: Care Gaps: Last annual wellness visit? None noted   Star Rating Drugs:  Medication:                Last Fill:         Day Supply Lisinopril                     01/11/22- 10/11/21 90ds   Assessment/Plan   Hypertension (BP goal <130/80) -Controlled -Current treatment: Carvedilol 12.5mg  BID Appropriate, Query effective, ,   Amlodipine 10mg  Appropriate, Query effective, ,  Furosemide 40mg  Appropriate, Effective, Safe, Accessible Valsartan 160mg  QD Appropriate, Query effective, ,  -Medications previously tried:  Metoprolol, Lisinopril -Current home readings:   November 2022: 01/26/21: 139/88 and 75 01/25/21: 128/86  March 2023: 06/07/21: 130/86  06/06/21: 141/82, 171/87 06/05/21: 191/110 06/04/21: 178/94 June 2023: 09/15/21: 119/73 09/14/21: 162/86 09/13/21: 174/83 09/12/21: 118/69 09/11/21: 114/72 Jan 2024: 213/77 168/134 210/? -Current dietary habits: eats with his sister -Current exercise habits: walks around house with a cane -Denies hypotensive/hypertensive symptoms -Educated on BP goals and benefits of medications for prevention of heart attack, stroke and kidney damage; Daily salt intake goal < 2300 mg; Exercise goal of 150 minutes per week; -Counseled to monitor BP at home daily, document, and provide log at future appointments -Counseled on diet and exercise extensively March 2023: BP elevated, will let Cardio know ASAP. Told patient to call me everyday his BP is above goal June 2023: BP still elevated, also patient is non-compliant on Metoprolol. Let Cardio know Jan 2024: Patient stopped Amlodipine last week. Thought that's what he was supposed to stop, not lisinopril. Counseled to start taking again and sent direct msg to Cardio regarding BP   Hyperlipidemia/CAD: (LDL goal < 55) The ASCVD Risk score (Arnett DK, et al., 2019) failed to calculate for the following reasons:   The 2019 ASCVD risk score is only valid for ages 7 to 68   The patient has a prior MI or stroke diagnosis Lab Results  Component Value Date  CHOL 126 10/11/2021   CHOL 166 07/27/2021   CHOL 176 05/03/2021   Lab Results  Component Value Date   HDL 43 10/11/2021   HDL 47 07/27/2021   HDL 36 (L) 05/03/2021   Lab Results  Component Value Date   LDLCALC 58 10/11/2021   LDLCALC 99 07/27/2021   LDLCALC 103 (H)  05/03/2021   Lab Results  Component Value Date   TRIG 147 10/11/2021   TRIG 109 07/27/2021   TRIG 211 (H) 05/03/2021   Lab Results  Component Value Date   CHOLHDL 2.9 10/11/2021   CHOLHDL 3.5 07/27/2021   CHOLHDL 4.9 05/03/2021   No results found for: "LDLDIRECT" Last vitamin D No results found for: "25OHVITD2", "25OHVITD3", "VD25OH" No results found for: "TSH" -Controlled -Current treatment: rosuvastatin 20 mg daily Appropriate, Effective, Safe, Accessible Ezetimibe 10mg  Appropriate, Effective, Safe, Accessible Aspirin 81 mg daily Appropriate, Effective, Safe, Accessible -Medications previously tried: Clopidogrel -Current dietary patterns: eats with his sister some  -Current exercise habits: has foot drop and walks with a cane -Educated on Cholesterol goals;  Benefits of statin for ASCVD risk reduction; Importance of limiting foods high in cholesterol; -Counseled on diet and exercise extensively Recommended to continue current medication     Depression/Anxiety -Controlled -Current treatment: Sertraline 50mg  Appropriate, Effective, Safe, Accessible -Medications previously tried/failed:  -PHQ9:     01/24/2022    8:26 PM 10/11/2021    8:43 AM 08/01/2021    2:17 PM  Depression screen PHQ 2/9  Decreased Interest 2 2 0  Down, Depressed, Hopeless 2 2 0  PHQ - 2 Score 4 4 0  Altered sleeping 2 3   Tired, decreased energy 1 2   Change in appetite 2 2   Feeling bad or failure about yourself  1 3   Trouble concentrating 0 0   Moving slowly or fidgety/restless 0 0   Suicidal thoughts 0 0   PHQ-9 Score 10 14   Difficult doing work/chores Not difficult at all Somewhat difficult    GAD7:     No data to display         -Educated on Benefits of medication for symptom control November 2022: Will ask PCP to change to SNRI for neuropathy March 2023: Will ask PCP to change to SNRI for neuropathy, didn't hear back from last recommendation June 2023: Will ask PCP to change  to SNRI for neuropathy, didn't hear back from last recommendation   Chronic Pain -Uncontrolled -Current treatment: Gabapentin Appropriate, Effective, Safe, Accessible -Medications previously tried: N/A  -Pain Scale Without Meds: 6/10  With Meds: 6/10  Aggravating Factors: Movement  Pain Type: Neuropathy  November 2022: Will ask for SNRI to help March 2023: Will ask PCP to change to SNRI for neuropathy, didn't hear back from last recommendation June 2023: Coordinated to get patient consult for Neuro. He stated he spoke with a friend who said, "Don't go to a neurologist, all they do is prescribe you Gabapentin and send you on your way." He is not interested in going anymore   Tremor (Goal: Decrease Symptoms) -Uncontrolled -Current treatment  None -Medications previously tried: None  March 2023: Will coordinate with team to get Neuro referral sent. Patient has fallen multiple times and with his DAPT this could be harmful June 2023: Not interested in Neuro but off DAPT  F/U March 2024  [place] Constable, Pharm.D. - [phone]

## 2022-04-13 ENCOUNTER — Telehealth: Payer: Self-pay

## 2022-04-13 NOTE — Progress Notes (Signed)
I have completed an onboard form for [facility] today and requesting all medication through Dr. Tobie Poet and Dr. Bettina Gavia for scripts. I am uploading it to step 2 folder to be signed off on.   Elray Mcgregor, [place] Pharmacist Assistant  [phone]

## 2022-04-16 ENCOUNTER — Other Ambulatory Visit: Payer: Self-pay

## 2022-04-16 DIAGNOSIS — J1282 Pneumonia due to coronavirus disease 2019: Secondary | ICD-10-CM

## 2022-04-16 DIAGNOSIS — E785 Hyperlipidemia, unspecified: Secondary | ICD-10-CM

## 2022-04-16 DIAGNOSIS — K2100 Gastro-esophageal reflux disease with esophagitis, without bleeding: Secondary | ICD-10-CM

## 2022-04-16 DIAGNOSIS — R600 Localized edema: Secondary | ICD-10-CM

## 2022-04-16 DIAGNOSIS — I110 Hypertensive heart disease with heart failure: Secondary | ICD-10-CM

## 2022-04-16 MED ORDER — PANTOPRAZOLE SODIUM 40 MG PO TBEC: 40.0000 mg | DELAYED_RELEASE_TABLET | Freq: Every day | ORAL | 1 refills | Status: DC

## 2022-04-16 MED ORDER — ASPIRIN 81 MG PO TBEC: 81.0000 mg | DELAYED_RELEASE_TABLET | Freq: Every day | ORAL | 1 refills | Status: DC

## 2022-04-16 MED ORDER — SERTRALINE HCL 50 MG PO TABS: 50.0000 mg | ORAL_TABLET | Freq: Every day | ORAL | 1 refills | Status: DC

## 2022-04-16 MED ORDER — ALBUTEROL SULFATE HFA 108 (90 BASE) MCG/ACT IN AERS: 2.0000 | INHALATION_SPRAY | RESPIRATORY_TRACT | 6 refills | Status: DC | PRN

## 2022-04-16 MED ORDER — EZETIMIBE 10 MG PO TABS: 10.0000 mg | ORAL_TABLET | Freq: Every day | ORAL | 1 refills | Status: DC

## 2022-04-16 MED ORDER — FUROSEMIDE 40 MG PO TABS: 40.0000 mg | ORAL_TABLET | Freq: Two times a day (BID) | ORAL | 1 refills | Status: DC

## 2022-04-16 MED ORDER — PREGABALIN 75 MG PO CAPS: 75.0000 mg | ORAL_CAPSULE | Freq: Three times a day (TID) | ORAL | 1 refills | Status: DC

## 2022-04-16 MED ORDER — ZINC SULFATE 220 (50 ZN) MG PO CAPS: 220.0000 mg | ORAL_CAPSULE | Freq: Every day | ORAL | 1 refills | Status: DC

## 2022-04-16 MED ORDER — CARBIDOPA-LEVODOPA 25-100 MG PO TABS: 1.0000 | ORAL_TABLET | Freq: Every evening | ORAL | 1 refills | Status: DC | PRN

## 2022-04-16 MED ORDER — VALSARTAN 160 MG PO TABS: 160.0000 mg | ORAL_TABLET | Freq: Every day | ORAL | 1 refills | Status: DC

## 2022-04-16 MED ORDER — VITAMIN D3 25 MCG (1000 UNIT) PO TABS: 1000.0000 [IU] | ORAL_TABLET | Freq: Every day | ORAL | 1 refills | Status: DC

## 2022-04-17 ENCOUNTER — Other Ambulatory Visit: Payer: Self-pay

## 2022-04-17 ENCOUNTER — Telehealth: Payer: Self-pay

## 2022-04-17 MED ORDER — AMLODIPINE BESYLATE 10 MG PO TABS: 10.0000 mg | ORAL_TABLET | Freq: Every day | ORAL | 3 refills | Status: DC

## 2022-04-17 MED ORDER — CARVEDILOL 12.5 MG PO TABS: 12.5000 mg | ORAL_TABLET | Freq: Two times a day (BID) | ORAL | 3 refills | Status: DC

## 2022-04-17 NOTE — Telephone Encounter (Signed)
Received the following message from Dr. Bettina Gavia below:  "Pt has requested to transfer all his scripts to [facility] to start filling and delivering his medications. Can you please send all medications in prescribed by you to [facility]. So far this is what I have. Please advise if there are any other medications that we have not listed.   Amlodipine 10mg   Carvedilol 12.5mg "  These medications were sent to the [facility] pharmacy to be filled for the patient. Called patient and informed him that they were sent to [facility]. Patient had no further questions at this time.

## 2022-04-17 NOTE — Telephone Encounter (Signed)
-----  Message from Richardo Priest, MD sent at 04/13/2022  9:21 AM EST ----- Regarding: FW: Onboarding ----- Message ----- From: Eulis Canner Sent: 04/13/2022   9:04 AM EST To: Richardo Priest, MD Subject: Onboarding                                     Good morning!!  Pt has requested to transfer all his scripts to [facility] to start filling and delivering his medications. Can you please send all medications in prescribed by you to [facility]. So far this is what I have. Please advise if there are any other medications that we have not listed.   Amlodipine 10mg  Carvedilol 12.5mg     Thank you!  Elray Mcgregor, [place] Pharmacist Assistant  [phone]

## 2022-04-30 ENCOUNTER — Ambulatory Visit: Payer: HMO | Attending: Cardiology | Admitting: Cardiology

## 2022-04-30 ENCOUNTER — Ambulatory Visit: Payer: Medicare Other | Admitting: Family Medicine

## 2022-04-30 ENCOUNTER — Encounter: Payer: Self-pay | Admitting: Cardiology

## 2022-04-30 VITALS — BP 164/66 | HR 48 | Ht 66.0 in | Wt 169.4 lb

## 2022-04-30 DIAGNOSIS — R0609 Other forms of dyspnea: Secondary | ICD-10-CM | POA: Diagnosis not present

## 2022-04-30 DIAGNOSIS — Z8673 Personal history of transient ischemic attack (TIA), and cerebral infarction without residual deficits: Secondary | ICD-10-CM

## 2022-04-30 DIAGNOSIS — Z952 Presence of prosthetic heart valve: Secondary | ICD-10-CM

## 2022-04-30 DIAGNOSIS — I10 Essential (primary) hypertension: Secondary | ICD-10-CM | POA: Diagnosis not present

## 2022-04-30 DIAGNOSIS — I110 Hypertensive heart disease with heart failure: Secondary | ICD-10-CM

## 2022-04-30 DIAGNOSIS — I340 Nonrheumatic mitral (valve) insufficiency: Secondary | ICD-10-CM

## 2022-04-30 NOTE — Progress Notes (Signed)
Cardiology Office Note:    Date:  04/30/2022   ID:  Jack Hodges, DOB [birth year], MRN [id number]  PCP:  Rochel Brome, MD  Cardiologist:  Jenne Campus, MD    Referring MD: Rochel Brome, MD   Chief Complaint  Patient presents with   Hypertension    History of Present Illness:    Jack Hodges is a 89 y.o. male   with past medical history significant for coronary artery disease, in 1996 he required coronary artery bypass graft.  Also in summer 2020 when he got cardiac catheterization done he underwent PTCA and stenting of left anterior descending artery in view of the fact that he got completely occluded LIMA going to [facility].  That cardiac catheterization was done in preparation for TAVI that eventually ended up having done on 10/27/2019.  He received Edwards sapiens 3 26 mm valve.  He also got history of bilateral foot drop, essential hypertension, dyslipidemia.   Comes today to months for follow-up.  Overall general he is doing well.  High but he described to have some balance problem which is an old problem.  He actually went to see some neurologist in [facility] he was told nothing can be done about it.  Denies have any cardiac complaints he still concerned about his blood pressure which is still not well-controlled.  Past Medical History:  Diagnosis Date   Acute bursitis of left shoulder 01/04/2020   Acute on chronic diastolic congestive heart failure ([place]) 11/13/2021   Last Assessment & Plan:  Formatting of this note is different from the original. Pertinent Data:   Current medication includes: furosemide - 20 mg lisinopriL - 10 mg metoprolol succinate - 25 mg.  BNP (pg/mL)  Date Value  11/15/2021 433.6 (H)   eGFR (mL/min/1.65m2)  Date Value  11/15/2021 >60.0    BP Readings from Last 3 Encounters:  11/30/21 (!) 147/82  11/15/21 (!) 109/54  11/13/21 (!) 134/74     Acute respiratory failure with hypoxia (HCC) 03/17/2020   Angina pectoris ([facility] 10/07/2019   Arthritis    Atherosclerosis  of aorta ([facility] 09/24/2019   Bilateral foot-drop 01/18/2017   BMI 27.0-27.9,adult 09/24/2019   Coronary artery disease involving native coronary artery of native heart without angina pectoris 11/01/2015   Overview:  Bypass surgery in 1996 Overview:  Overview:  Bypass surgery in [place]  Continue his current regimen.  Follow up as noted.   CVA (cerebral vascular accident) ([facility] 04/03/2016   Last Assessment & Plan:  The patient underwent extensive CVA workup.  MRI is negative.  He does have aortic stenosis by echocardiogram.  Upon review of Care everywhere he is followed by Dr. KAgustin Cree cardiologist for this.  Carotid ultrasound negative for hemodynamically significant stenosis.  Lipid panel within normal limits.  He has not had any further episodes of dizziness or nausea vomiting.    Demand ischemia 11/14/2021   Last Assessment & Plan:  Formatting of this note might be different from the original. Troponin flat No anginal symptoms Formatting of this note might be different from the original. Last Assessment & Plan:  Formatting of this note might be different from the original. Troponin flat No anginal symptoms   Dementia ([facility]    Depression    DNR (do not resuscitate) 04/03/2016   Overview:  I had an extensive discussion with the patient on 04/03/2016 regarding his end of life wishes.  He and his daughter agree that do not resuscitate is in  keeping with his beliefs.   Dyslipidemia 11/01/2015   Last Assessment & Plan:  Formatting of this note might be different from the original. Cholesterol 136   Triglycerides  83  HDL  50.1  LDL Calculated  69  VLDL Cholesterol Cal  17   Continue statin.   Dyspnea    with activity   Essential hypertension 11/01/2015   Last Assessment & Plan:  Resume home medications.  Follow-up as noted above.   Generalized weakness 03/13/2020   GERD (gastroesophageal reflux disease)    Herpes zoster 05/19/2019   History of aortic stenosis 03/11/2020    History of CVA (cerebrovascular accident) 11/14/2021   Last Assessment & Plan:  Formatting of this note might be different from the original. Neurologically intact upon assessment -continue aspirin, statin   Hypertensive heart disease with heart failure ([place]) 11/01/2015   Last Assessment & Plan:  Resume home medications.  Follow-up as noted above.   Myocardial infarction ([place])    approx 1994   Neuropathy    Peripheral neuropathy 11/14/2021   Last Assessment & Plan:  Formatting of this note might be different from the original. Bilateral lower extremity cool to touch with diminished sensation Patient reports findings are chronic Continue home dose Lyrica Formatting of this note might be different from the original. Last Assessment & Plan:  Formatting of this note might be different from the original. 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   Thrombocytopenia ([place]) 03/13/2020   Trochanteric bursitis of left hip 01/19/2021    Past Surgical History:  Procedure Laterality Date   CARDIAC CATHETERIZATION     CARDIAC SURGERY     CATARACT EXTRACTION, BILATERAL     CORONARY ARTERY BYPASS GRAFT     CORONARY STENT INTERVENTION N/A 10/07/2019   Procedure: CORONARY STENT INTERVENTION;  Surgeon: Sherren Mocha, MD;  Location: [place] [facility];  Service: Cardiovascular;  Laterality: N/A;   EYE SURGERY     HEMORROIDECTOMY     RIGHT/LEFT HEART CATH AND CORONARY/GRAFT ANGIOGRAPHY N/A 10/07/2019   Procedure: RIGHT/LEFT HEART CATH AND CORONARY/GRAFT ANGIOGRAPHY;   Surgeon: Sherren Mocha, MD;  Location: [place] [facility];  Service: Cardiovascular;  Laterality: N/A;   TEE WITHOUT CARDIOVERSION N/A 10/27/2019   Procedure: TRANSESOPHAGEAL ECHOCARDIOGRAM (TEE);  Surgeon: Burnell Blanks, MD;  Location: [place] [facility];  Service: Open Heart Surgery;  Laterality: N/A;   TRANSCATHETER AORTIC VALVE REPLACEMENT, TRANSFEMORAL N/A 10/27/2019   Procedure: TRANSCATHETER AORTIC VALVE REPLACEMENT, TRANSFEMORAL;  Surgeon: Burnell Blanks, MD;  Location: [place] [facility];  Service: Open Heart Surgery;  Laterality: N/A;    Current Medications: Current Meds  Medication Sig   acetaminophen (TYLENOL) 650 MG CR tablet Take 650 mg by mouth every 8 (eight) hours as needed for pain.    albuterol (VENTOLIN HFA) 108 (90 Base) MCG/ACT inhaler Inhale 2 puffs into the lungs every 2 (two) hours as needed for wheezing or shortness of breath.   amLODipine (NORVASC) 10 MG tablet Take 1 tablet (10 mg total) by mouth daily.  amLODipine (NORVASC) 5 MG tablet Take 5 mg by mouth daily.   ascorbic acid (VITAMIN C) 500 MG tablet Take 1 tablet (500 mg total) by mouth daily.   aspirin EC 81 MG tablet Take 1 tablet (81 mg total) by mouth daily. Swallow whole.   carbidopa-levodopa (SINEMET) 25-100 MG tablet Take 1 tablet by mouth at bedtime as needed (rESTLESS LEGS.).   carvedilol (COREG) 12.5 MG tablet Take 1 tablet (12.5 mg total) by mouth 2 (two) times daily.   cholecalciferol (VITAMIN D3) 25 MCG (1000 UNIT) tablet Take 1 tablet (1,000 Units total) by mouth daily.   ezetimibe (ZETIA) 10 MG tablet Take 1 tablet (10 mg total) by mouth daily.   fluorometholone (FML) 0.1 % ophthalmic suspension Place 1 drop into both eyes daily.   furosemide (LASIX) 40 MG tablet Take 1 tablet (40 mg total) by mouth 2 (two) times daily. Then go back to one pill a day   nitroGLYCERIN (NITROSTAT) 0.4 MG SL tablet Place 1 tablet (0.4 mg total) under the tongue every 5 (five) minutes as needed for  chest pain.   pantoprazole (PROTONIX) 40 MG tablet Take 1 tablet (40 mg total) by mouth daily.   pregabalin (LYRICA) 75 MG capsule Take 1 capsule (75 mg total) by mouth 3 (three) times daily.   rosuvastatin (CRESTOR) 20 MG tablet Take 1 tablet (20 mg total) by mouth at bedtime.   sertraline (ZOLOFT) 50 MG tablet Take 1 tablet (50 mg total) by mouth daily.   valsartan (DIOVAN) 160 MG tablet Take 1 tablet (160 mg total) by mouth daily.   vitamin B-12 (CYANOCOBALAMIN) 500 MCG tablet Take 500 mcg by mouth daily.   zinc sulfate 220 (50 Zn) MG capsule Take 1 capsule (220 mg total) by mouth daily.   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  He has one son that lives with him and one daughter that lives beside of him. He is close with his family.  His sister cooks meals for him regularly.  He has friends at the cafe he visits regularly.   Social Determinants of Health   Financial Resource Strain: Low Risk  (09/15/2021)   Overall Financial Resource Strain (CARDIA)    Difficulty of Paying Living Expenses: Not hard at all  Food  Insecurity: No Food Insecurity (07/13/2020)   Hunger Vital Sign    Worried About Running Out of Food in the Last Year: Never true    Ran Out of Food in the Last Year: Never  true  Transportation Needs: No Transportation Needs (09/15/2021)   PRAPARE - Hydrologist (Medical): No    Lack of Transportation (Non-Medical): No  Physical Activity: Insufficiently Active (07/13/2020)   Exercise Vital Sign    Days of Exercise per Week: 2 days    Minutes of Exercise per Session: 10 min  Stress: No Stress Concern Present (07/13/2020)   [place]    Feeling of Stress : Not at all  Social Connections: Moderately Isolated (07/13/2020)   Social Connection and Isolation Panel [NHANES]    Frequency of Communication with Friends and Family: More than three times a week    Frequency of Social Gatherings with Friends and Family: More than three times a week    Attends Religious Services: 1 to 4 times per year    Active Member of [employer] or Organizations: No    Attends Archivist Meetings: Never    Marital Status: Widowed     Family History: The patient's family history includes Arthritis in his brother and father; Cancer in his brother and mother; Diabetes in his mother; Hypertension in his father; Kidney disease in his mother; Migraines in his father; Sleep disorder in his father and mother; Stroke in his mother. ROS:   Please see the history of present illness.    All 14 point review of systems negative except as described per history of present illness  EKGs/Labs/Other Studies Reviewed:      Recent Labs: 01/11/2022: ALT 14; Hemoglobin 11.6; NT-Pro BNP 4,599; Platelets 186 01/26/2022: BUN 17; Creatinine, Ser 1.04; Potassium 4.1; Sodium 144  Recent Lipid Panel    Component Value Date/Time   CHOL 126 10/11/2021 0916   TRIG 147 10/11/2021 0916   HDL 43 10/11/2021 0916   CHOLHDL 2.9 10/11/2021 0916    LDLCALC 58 10/11/2021 0916    Physical Exam:    VS:  BP (!) 164/66 (BP Location: Right Arm, Patient Position: Sitting)   Pulse (!) 48   Ht 5' 6"$  (1.676 m)   Wt 169 lb 6.4 oz (76.8 kg)   SpO2 92%   BMI 27.34 kg/m     Wt Readings from Last 3 Encounters:  04/30/22 169 lb 6.4 oz (76.8 kg)  03/29/22 165 lb (74.8 kg)  02/13/22 169 lb (76.7 kg)     GEN:  Well nourished, well developed in no acute distress HEENT: Normal NECK: No JVD; No carotid bruits LYMPHATICS: No lymphadenopathy CARDIAC: RRR, no murmurs, no rubs, no gallops RESPIRATORY:  Clear to auscultation without rales, wheezing or rhonchi  ABDOMEN: Soft, non-tender, non-distended MUSCULOSKELETAL:  No edema; No deformity  SKIN: Warm and dry LOWER EXTREMITIES: no swelling NEUROLOGIC:  Alert and oriented x 3 PSYCHIATRIC:  Normal affect   ASSESSMENT:    1. Hypertensive heart disease with heart failure ([place])   2. Essential hypertension   3. Nonrheumatic mitral valve regurgitation   4. History of CVA (cerebrovascular accident)   5. S/P TAVR (transcatheter aortic valve replacement)    PLAN:    In order of problems listed above:  Hypertensive heart disease blood pressure still not well-controlled I will check his Chem-7 if Chem-7 is fine we will probably going to add some Aldactone. Nonrheumatic mitral valve regurgitation.  Mild to moderate, in December we will repeat his echocardiogram. Dyslipidemia I did review his K PN which show me data from summer of last year with LDL of 58 HDL 43.  Will continue present  management. History of CVA, on antiplatelet therapy as well as statin which I will continue. Status post TAVI.  Echocardiogram will be repeated in the summertime.   Medication Adjustments/Labs and Tests Ordered: Current medicines are reviewed at length with the patient today.  Concerns regarding medicines are outlined above.  No orders of the defined types were placed in this encounter.  Medication changes: No  orders of the defined types were placed in this encounter.   Signed, Park Liter, MD, [facility] 04/30/2022 10:00 AM    [place]

## 2022-04-30 NOTE — Patient Instructions (Signed)
Medication Instructions:  Your physician recommends that you continue on your current medications as directed. Please refer to the Current Medication list given to you today.  *If you need a refill on your cardiac medications before your next appointment, please call your pharmacy*   Lab Work: BMP- today If you have labs (blood work) drawn today and your tests are completely normal, you will receive your results only by: [place] (if you have MyChart) OR A paper copy in the mail If you have any lab test that is abnormal or we need to change your treatment, we will call you to review the results.   Testing/Procedures: Your physician has requested that you have an echocardiogram. Echocardiography is a painless test that uses sound waves to create images of your heart. It provides your doctor with information about the size and shape of your heart and how well your heart's chambers and valves are working. This procedure takes approximately one hour. There are no restrictions for this procedure. Please do NOT wear cologne, perfume, aftershave, or lotions (deodorant is allowed). Please arrive 15 minutes prior to your appointment time.    Follow-Up: At [facility], you and your health needs are our priority.  As part of our continuing mission to provide you with exceptional heart care, we have created designated Provider Care Teams.  These Care Teams include your primary Cardiologist (physician) and Advanced Practice Providers (APPs -  Physician Assistants and Nurse Practitioners) who all work together to provide you with the care you need, when you need it.  We recommend signing up for the patient portal called "MyChart".  Sign up information is provided on this After Visit Summary.  MyChart is used to connect with patients for Virtual Visits (Telemedicine).  Patients are able to view lab/test results, encounter notes, upcoming appointments, etc.  Non-urgent messages can be sent to your  provider as well.   To learn more about what you can do with MyChart, go to [email or web address].    Your next appointment:   6 month(s)  The format for your next appointment:   In Person  Provider:   Jenne Campus, MD    Other Instructions NA

## 2022-05-01 ENCOUNTER — Ambulatory Visit: Payer: PPO

## 2022-05-01 LAB — BASIC METABOLIC PANEL
BUN/Creatinine Ratio: 18 (ref 10–24)
BUN: 16 mg/dL (ref 8–27)
CO2: 24 mmol/L (ref 20–29)
Calcium: 8.8 mg/dL (ref 8.6–10.2)
Chloride: 109 mmol/L — ABNORMAL HIGH (ref 96–106)
Creatinine, Ser: 0.9 mg/dL (ref 0.76–1.27)
Glucose: 74 mg/dL (ref 70–99)
Potassium: 4.4 mmol/L (ref 3.5–5.2)
Sodium: 144 mmol/L (ref 134–144)
eGFR: 82 mL/min/{1.73_m2} (ref >59)

## 2022-05-03 ENCOUNTER — Telehealth: Payer: Self-pay

## 2022-05-03 DIAGNOSIS — I10 Essential (primary) hypertension: Secondary | ICD-10-CM

## 2022-05-03 MED ORDER — SPIRONOLACTONE 25 MG PO TABS: 12.5000 mg | ORAL_TABLET | Freq: Every day | ORAL | 3 refills | Status: DC

## 2022-05-03 NOTE — Telephone Encounter (Signed)
Results reviewed with pt as per Dr. Wendy Poet note. Pt agreed to come in for BMP in 1 week. Pt verbalized understanding and had no additional questions. Routed to PCP

## 2022-05-10 DIAGNOSIS — I10 Essential (primary) hypertension: Secondary | ICD-10-CM | POA: Diagnosis not present

## 2022-05-11 LAB — BASIC METABOLIC PANEL
BUN/Creatinine Ratio: 16 (ref 10–24)
BUN: 21 mg/dL (ref 8–27)
CO2: 24 mmol/L (ref 20–29)
Calcium: 9.2 mg/dL (ref 8.6–10.2)
Chloride: 103 mmol/L (ref 96–106)
Creatinine, Ser: 1.34 mg/dL — ABNORMAL HIGH (ref 0.76–1.27)
Glucose: 87 mg/dL (ref 70–99)
Potassium: 4.4 mmol/L (ref 3.5–5.2)
Sodium: 142 mmol/L (ref 134–144)
eGFR: 51 mL/min/{1.73_m2} — ABNORMAL LOW (ref >59)

## 2022-05-16 ENCOUNTER — Telehealth: Payer: Self-pay

## 2022-05-16 DIAGNOSIS — I10 Essential (primary) hypertension: Secondary | ICD-10-CM

## 2022-05-16 NOTE — Telephone Encounter (Signed)
Left voice message with normal results per Dr. Wendy Poet note. Routed to PCP.

## 2022-05-18 DIAGNOSIS — I10 Essential (primary) hypertension: Secondary | ICD-10-CM | POA: Diagnosis not present

## 2022-05-19 LAB — BASIC METABOLIC PANEL
BUN/Creatinine Ratio: 17 (ref 10–24)
BUN: 23 mg/dL (ref 8–27)
CO2: 26 mmol/L (ref 20–29)
Calcium: 9.4 mg/dL (ref 8.6–10.2)
Chloride: 104 mmol/L (ref 96–106)
Creatinine, Ser: 1.35 mg/dL — ABNORMAL HIGH (ref 0.76–1.27)
Glucose: 68 mg/dL — ABNORMAL LOW (ref 70–99)
Potassium: 4.5 mmol/L (ref 3.5–5.2)
Sodium: 140 mmol/L (ref 134–144)
eGFR: 50 mL/min/{1.73_m2} — ABNORMAL LOW (ref >59)

## 2022-05-22 ENCOUNTER — Telehealth: Payer: Self-pay

## 2022-05-22 NOTE — Telephone Encounter (Signed)
Results reviewed with pt as per Dr. Krasowski's note.  Pt verbalized understanding and had no additional questions. Routed to PCP  

## 2022-05-23 NOTE — Progress Notes (Signed)
Subjective:  Patient ID: Jack Hodges, male    DOB: March 16, 1931  Age: 89 y.o. MRN: [id number]  Chief Complaint  Patient presents with   Hypertension    HPI  Patient is an 89 year old white male with past medical history for hypertensive heart disease with heart failure, stroke, aortic stenosis status post TAVR, and hyperlipidemia who presents for follow-up of his blood pressure.  Patient's blood pressures at home have ranged anywhere from 130/65 up to 193/77.  Pulse is anywhere from 58-69.  Patient's blood pressure is very difficult to hear in the right arm and unable to be heard in the left arm.  Blood pressure here was 110/60.  This was rechecked by me and continues to be approximately this level.  I had recommended the patient to increase valsartan to 160 mg twice daily, however he is only taking it once a day.     01/24/2022    8:26 PM 10/11/2021    8:43 AM 08/01/2021    2:17 PM 05/16/2021   10:32 AM 07/13/2020    8:57 AM  Depression screen PHQ 2/9  Decreased Interest 2 2 0 0 1  Down, Depressed, Hopeless 2 2 0 0 1  PHQ - 2 Score 4 4 0 0 2  Altered sleeping '2 3  1 '$ 0  Tired, decreased energy 1 2  0 0  Change in appetite 2 2  0 0  Feeling bad or failure about yourself  '1 3  1 '$ 0  Trouble concentrating 0 0  0 0  Moving slowly or fidgety/restless 0 0  0 0  Suicidal thoughts 0 0  0 0  PHQ-9 Score '10 14  2 2  '$ Difficult doing work/chores Not difficult at all Somewhat difficult            03/17/2020   10:00 AM 07/13/2020    8:56 AM 05/16/2021   10:32 AM 08/01/2021    2:17 PM 01/24/2022    8:26 PM  Fall Risk  Falls in the past year?  1 1  0  Was there an injury with Fall?  0 1 0 0  Fall Risk Category Calculator  2 3  0  Fall Risk Category (Retired)  Moderate High  Low  (RETIRED) Patient Fall Risk Level Moderate fall risk High fall risk High fall risk  Moderate fall risk  Patient at Risk for Falls Due to  History of fall(s);Impaired balance/gait   Impaired balance/gait  Fall risk  Follow up  Falls evaluation completed;Education provided;Falls prevention discussed  Falls evaluation completed Falls evaluation completed;Education provided;Falls prevention discussed      Review of Systems  Constitutional:  Negative for chills and fever.  HENT:  Negative for congestion, rhinorrhea and sore throat.   Respiratory:  Negative for cough and shortness of breath.   Cardiovascular:  Negative for chest pain and palpitations.  Gastrointestinal:  Negative for abdominal pain, constipation, diarrhea, nausea and vomiting.  Genitourinary:  Negative for dysuria and urgency.  Musculoskeletal:  Positive for arthralgias (right hip pain). Negative for back pain and myalgias.  Neurological:  Positive for dizziness and headaches.  Psychiatric/Behavioral:  Negative for dysphoric mood. The patient is not nervous/anxious.     Current Outpatient Medications on File Prior to Visit  Medication Sig Dispense Refill   acetaminophen (TYLENOL) 650 MG CR tablet Take 650 mg by mouth every 8 (eight) hours as needed for pain.      albuterol (VENTOLIN HFA) 108 (90 Base) MCG/ACT inhaler Inhale 2 puffs  into the lungs every 2 (two) hours as needed for wheezing or shortness of breath. 6.7 g 6   amLODipine (NORVASC) 5 MG tablet Take 5 mg by mouth daily.     ascorbic acid (VITAMIN C) 500 MG tablet Take 1 tablet (500 mg total) by mouth daily. 30 tablet 0   aspirin EC 81 MG tablet Take 1 tablet (81 mg total) by mouth daily. Swallow whole. 90 tablet 1   carbidopa-levodopa (SINEMET) 25-100 MG tablet Take 1 tablet by mouth at bedtime as needed (rESTLESS LEGS.). 90 tablet 1   carvedilol (COREG) 12.5 MG tablet Take 1 tablet (12.5 mg total) by mouth 2 (two) times daily. 180 tablet 3   cholecalciferol (VITAMIN D3) 25 MCG (1000 UNIT) tablet Take 1 tablet (1,000 Units total) by mouth daily. 90 tablet 1   ezetimibe (ZETIA) 10 MG tablet Take 1 tablet (10 mg total) by mouth daily. 90 tablet 1   fluorometholone (FML) 0.1 %  ophthalmic suspension Place 1 drop into both eyes daily.     furosemide (LASIX) 40 MG tablet Take 1 tablet (40 mg total) by mouth 2 (two) times daily. Then go back to one pill a day (Patient taking differently: Take 40 mg by mouth daily.) 180 tablet 1   nitroGLYCERIN (NITROSTAT) 0.4 MG SL tablet Place 1 tablet (0.4 mg total) under the tongue every 5 (five) minutes as needed for chest pain. 25 tablet 2   pantoprazole (PROTONIX) 40 MG tablet Take 1 tablet (40 mg total) by mouth daily. 100 tablet 1   rosuvastatin (CRESTOR) 20 MG tablet Take 1 tablet (20 mg total) by mouth at bedtime. 90 tablet 3   sertraline (ZOLOFT) 50 MG tablet Take 1 tablet (50 mg total) by mouth daily. 90 tablet 1   spironolactone (ALDACTONE) 25 MG tablet Take 0.5 tablets (12.5 mg total) by mouth daily. 45 tablet 3   valsartan (DIOVAN) 160 MG tablet Take 1 tablet (160 mg total) by mouth daily. 90 tablet 1   vitamin B-12 (CYANOCOBALAMIN) 500 MCG tablet Take 500 mcg by mouth daily.     zinc sulfate 220 (50 Zn) MG capsule Take 1 capsule (220 mg total) by mouth daily. 90 capsule 1   Current Facility-Administered Medications on File Prior to Visit  Medication Dose Route Frequency Provider Last Rate Last Admin   triamcinolone acetonide (KENALOG-40) injection 60 mg  60 mg Intramuscular Once Lillard Anes, MD       Past Medical History:  Diagnosis Date   Acute bursitis of left shoulder 01/04/2020   Acute on chronic diastolic congestive heart failure ([place]) 11/13/2021   Last Assessment & Plan:  Formatting of this note is different from the original. Pertinent Data:   Current medication includes: furosemide - 20 mg lisinopriL - 10 mg metoprolol succinate - 25 mg.  BNP (pg/mL)  Date Value  11/15/2021 433.6 (H)   eGFR (mL/min/1.18m2)  Date Value  11/15/2021 >60.0    BP Readings from Last 3 Encounters:  11/30/21 (!) 147/82  11/15/21 (!) 109/54  11/13/21 (!) 134/74     Acute respiratory failure with hypoxia ([facility]) 03/17/2020   Angina  pectoris ([facility] 10/07/2019   Arthritis    Atherosclerosis of aorta ([facility] 09/24/2019   Bilateral foot-drop 01/18/2017   BMI 27.0-27.9,adult 09/24/2019   Coronary artery disease involving native coronary artery of native heart without angina pectoris 11/01/2015   Overview:  Bypass surgery in 1996 Overview:  Overview:  Bypass surgery in 1[place]  Continue his current regimen.  Follow up as noted.   CVA (cerebral vascular accident) ([place]) 04/03/2016   Last Assessment & Plan:  The patient underwent extensive CVA workup.  MRI is negative.  He does have aortic stenosis by echocardiogram.  Upon review of Care everywhere he is followed by Dr. Agustin Cree, cardiologist for this.  Carotid ultrasound negative for hemodynamically significant stenosis.  Lipid panel within normal limits.  He has not had any further episodes of dizziness or nausea vomiting.    Demand ischemia 11/14/2021   Last Assessment & Plan:  Formatting of this note might be different from the original. Troponin flat No anginal symptoms Formatting of this note might be different from the original. Last Assessment & Plan:  Formatting of this note might be different from the original. Troponin flat No anginal symptoms   Dementia ([place])    Depression    DNR (do not resuscitate) 04/03/2016   Overview:  I had an extensive discussion with the patient on 04/03/2016 regarding his end of life wishes.  He and his daughter agree that do not resuscitate is in keeping with his beliefs.   Dyslipidemia 11/01/2015   Last Assessment & Plan:  Formatting of this note might be different from the original. Cholesterol 136   Triglycerides  83  HDL  50.1  LDL Calculated  69  VLDL Cholesterol Cal  17   Continue statin.   Dyspnea    with activity   Essential hypertension 11/01/2015   Last Assessment & Plan:  Resume home medications.  Follow-up as noted above.   Generalized weakness 03/13/2020   GERD (gastroesophageal reflux disease)    Herpes  zoster 05/19/2019   History of aortic stenosis 03/11/2020   History of CVA (cerebrovascular accident) 11/14/2021   Last Assessment & Plan:  Formatting of this note might be different from the original. Neurologically intact upon assessment -continue aspirin, statin   Hypertensive heart disease with heart failure ([place]) 11/01/2015   Last Assessment & Plan:  Resume home medications.  Follow-up as noted above.   Myocardial infarction ([place])    approx 1994   Neuropathy    Peripheral neuropathy 11/14/2021   Last Assessment & Plan:  Formatting of this note might be different from the original. Bilateral lower extremity cool to touch with diminished sensation Patient reports findings are chronic Continue home dose Lyrica Formatting of this note might be different from the original. Last Assessment & Plan:  Formatting of this note might be different from the original. 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   Thrombocytopenia ([place]) 03/13/2020   Trochanteric bursitis of left hip 01/19/2021   Past Surgical History:  Procedure Laterality Date   CARDIAC CATHETERIZATION     CARDIAC SURGERY     CATARACT EXTRACTION, BILATERAL     CORONARY ARTERY BYPASS GRAFT     CORONARY STENT INTERVENTION N/A 10/07/2019   Procedure: CORONARY STENT INTERVENTION;  Surgeon: Sherren Mocha, MD;  Location: [facility];  Service: Cardiovascular;  Laterality: N/A;   EYE SURGERY  HEMORROIDECTOMY     RIGHT/LEFT HEART CATH AND CORONARY/GRAFT ANGIOGRAPHY N/A 10/07/2019    Procedure: RIGHT/LEFT HEART CATH AND CORONARY/GRAFT ANGIOGRAPHY;  Surgeon: Sherren Mocha, MD;  Location: [facility];  Service: Cardiovascular;  Laterality: N/A;   TEE WITHOUT CARDIOVERSION N/A 10/27/2019   Procedure: TRANSESOPHAGEAL ECHOCARDIOGRAM (TEE);  Surgeon: Burnell Blanks, MD;  Location: [facility];  Service: Open Heart Surgery;  Laterality: N/A;   TRANSCATHETER AORTIC VALVE REPLACEMENT, TRANSFEMORAL N/A 10/27/2019   Procedure: TRANSCATHETER AORTIC VALVE REPLACEMENT, TRANSFEMORAL;  Surgeon: Burnell Blanks, MD;  Location: [facility];  Service: Open Heart Surgery;  Laterality: N/A;    Family History  Problem Relation Age of Onset   Cancer Mother    Diabetes Mother    Kidney disease Mother    Stroke Mother    Sleep disorder Mother    Arthritis Father    Migraines Father    Hypertension Father    Sleep disorder Father    Arthritis Brother    Cancer Brother    Social History   Socioeconomic History   Marital status: Widowed    Spouse name: Curly [place]   Number of children: 2   Years of education: Not on file   Highest education level: Not on file  Occupational History   Occupation: Retired    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  He has one son that lives with him and one daughter that lives beside of him. He is close with his family.  His sister cooks meals for him regularly.  He has friends at the cafe he visits regularly.   Social Determinants of Health   Financial Resource Strain: Low Risk  (09/15/2021)   Overall  Financial Resource Strain (CARDIA)    Difficulty of Paying Living Expenses: Not hard at all  Food Insecurity: No Food Insecurity (07/13/2020)   Hunger Vital Sign    Worried About Running Out of Food in the Last Year: Never true    Ran Out of Food in the Last Year: Never true  Transportation Needs: No Transportation Needs (09/15/2021)   PRAPARE - Hydrologist (Medical): No    Lack of Transportation (Non-Medical): No  Physical Activity: Insufficiently Active (07/13/2020)   Exercise Vital Sign    Days of Exercise per Week: 2 days    Minutes of Exercise per Session: 10 min  Stress: No Stress Concern Present (07/13/2020)   [place]    Feeling of Stress : Not at all  Social Connections: Moderately Isolated (07/13/2020)   Social Connection and Isolation Panel [NHANES]    Frequency of Communication with Friends and Family: More than three times a week    Frequency of Social Gatherings with Friends and Family: More than three times a week    Attends Religious Services: 1 to 4 times per year    Active Member of Clubs or Organizations: No    Attends  Club or Organization Meetings: Never    Marital Status: Widowed    Objective:  BP 110/60   Pulse 72   Temp (!) 96.2 F (35.7 C)   Resp 16   Ht '5\' 6"'$  (1.676 m)   Wt 163 lb (73.9 kg)   BMI 26.31 kg/m      05/24/2022    8:37 AM 04/30/2022    9:27 AM 04/06/2022   10:19 AM  BP/Weight  Systolic BP [id number]  Diastolic BP 60 66 60  Wt. (Lbs) 163 169.4   BMI 26.31 kg/m2 27.34 kg/m2    Physical Exam Vitals reviewed.  Constitutional:      Appearance: Normal appearance.  Neck:     Vascular: No carotid bruit.  Cardiovascular:     Rate and Rhythm: Normal rate and regular rhythm.     Heart sounds: Murmur heard.  Pulmonary:     Effort: Pulmonary effort is normal.     Breath sounds: Normal breath sounds. No wheezing, rhonchi or rales.  Abdominal:      General: Bowel sounds are normal.     Palpations: Abdomen is soft.     Tenderness: There is no abdominal tenderness.  Neurological:     Mental Status: He is alert.  Psychiatric:        Mood and Affect: Mood normal.        Behavior: Behavior normal.      Diabetic Foot Exam - Simple   No data filed      Lab Results  Component Value Date   WBC 9.1 05/24/2022   HGB 14.7 05/24/2022   HCT 47.5 05/24/2022   PLT 231 05/24/2022   GLUCOSE 90 05/24/2022   CHOL 192 05/24/2022   TRIG 192 (H) 05/24/2022   HDL 40 05/24/2022   LDLCALC 118 (H) 05/24/2022   ALT 17 05/24/2022   AST 24 05/24/2022   NA 144 05/24/2022   K 4.3 05/24/2022   CL 105 05/24/2022   CREATININE 1.36 (H) 05/24/2022   BUN 29 (H) 05/24/2022   CO2 24 05/24/2022   INR 1.1 10/23/2019   HGBA1C 4.8 10/23/2019      Assessment & Plan:    Hypertensive heart disease with heart failure [facility]) Assessment & Plan: Continue on valsartan 160 mg once daily, Carvedilol 12.5 mg twice daily, Amlodipine 5 mg daily..   Orders: -     Comprehensive metabolic panel -     CBC with Differential/Platelet  Gastroesophageal reflux disease without esophagitis Assessment & Plan: The current medical regimen is effective;  continue present plan and medications.    Dyslipidemia Assessment & Plan: Well controlled.  No changes to medicines.  Continue to work on eating a healthy diet and exercise.  Labs drawn today.    Orders: -     Lipid panel  Coronary artery disease involving native coronary artery of native heart without angina pectoris Assessment & Plan: Continue zetia, crestor 20 mg before bed, and aspirin 81 mg daily.  Management per specialist.     Other orders -     Cardiovascular Risk Assessment     No orders of the defined types were placed in this encounter.   Orders Placed This Encounter  Procedures   Comprehensive metabolic panel   Lipid panel   CBC with Differential/Platelet   Cardiovascular Risk  Assessment     Follow-up: Return in about 3 months (around 08/22/2022) for chronic fasting.  Total time spent on today's visit was greater than 30 minutes, including  both face-to-face time and nonface-to-face time personally spent on review of chart (labs and imaging), discussing labs and goals, discussing further work-up, treatment options, referrals to specialist if needed, reviewing outside records of pertinent, answering patient's questions, and coordinating care.  I,Marla I Leal-Borjas,acting as a scribe for Rochel Brome, MD.,have documented all relevant documentation on the behalf of Rochel Brome, MD,as directed by  Rochel Brome, MD while in the presence of Rochel Brome, MD.   An After Visit Summary was printed and given to the patient.  Rochel Brome, MD [facility] [phone]

## 2022-05-23 NOTE — Assessment & Plan Note (Addendum)
Continue on valsartan 160 mg once daily, Carvedilol 12.5 mg twice daily, Amlodipine 5 mg daily.Marland Kitchen

## 2022-05-23 NOTE — Assessment & Plan Note (Signed)
The current medical regimen is effective;  continue present plan and medications.  

## 2022-05-23 NOTE — Assessment & Plan Note (Signed)
Well controlled.  ?No changes to medicines.  ?Continue to work on eating a healthy diet and exercise.  ?Labs drawn today.  ?

## 2022-05-23 NOTE — Assessment & Plan Note (Signed)
Continue zetia, crestor 20 mg before bed, and aspirin 81 mg daily.  Management per specialist.

## 2022-05-24 ENCOUNTER — Encounter: Payer: Self-pay | Admitting: Family Medicine

## 2022-05-24 ENCOUNTER — Ambulatory Visit (INDEPENDENT_AMBULATORY_CARE_PROVIDER_SITE_OTHER): Payer: HMO | Admitting: Family Medicine

## 2022-05-24 VITALS — BP 110/60 | HR 72 | Temp 96.2°F | Resp 16 | Ht 66.0 in | Wt 163.0 lb

## 2022-05-24 DIAGNOSIS — I2510 Atherosclerotic heart disease of native coronary artery without angina pectoris: Secondary | ICD-10-CM

## 2022-05-24 DIAGNOSIS — I509 Heart failure, unspecified: Secondary | ICD-10-CM | POA: Diagnosis not present

## 2022-05-24 DIAGNOSIS — K219 Gastro-esophageal reflux disease without esophagitis: Secondary | ICD-10-CM

## 2022-05-24 DIAGNOSIS — I110 Hypertensive heart disease with heart failure: Secondary | ICD-10-CM

## 2022-05-24 DIAGNOSIS — E785 Hyperlipidemia, unspecified: Secondary | ICD-10-CM | POA: Diagnosis not present

## 2022-05-25 LAB — CBC WITH DIFFERENTIAL/PLATELET
Basophils Absolute: 0.1 10*3/uL (ref 0.0–0.2)
Basos: 1 %
EOS (ABSOLUTE): 0.5 10*3/uL — ABNORMAL HIGH (ref 0.0–0.4)
Eos: 5 %
Hematocrit: 47.5 % (ref 37.5–51.0)
Hemoglobin: 14.7 g/dL (ref 13.0–17.7)
Immature Grans (Abs): 0 10*3/uL (ref 0.0–0.1)
Immature Granulocytes: 0 %
Lymphocytes Absolute: 1.6 10*3/uL (ref 0.7–3.1)
Lymphs: 18 %
MCH: 27.1 pg (ref 26.6–33.0)
MCHC: 30.9 g/dL — ABNORMAL LOW (ref 31.5–35.7)
MCV: 88 fL (ref 79–97)
Monocytes Absolute: 0.6 10*3/uL (ref 0.1–0.9)
Monocytes: 6 %
Neutrophils Absolute: 6.3 10*3/uL (ref 1.4–7.0)
Neutrophils: 70 %
Platelets: 231 10*3/uL (ref 150–450)
RBC: 5.43 x10E6/uL (ref 4.14–5.80)
RDW: 16.9 % — ABNORMAL HIGH (ref 11.6–15.4)
WBC: 9.1 10*3/uL (ref 3.4–10.8)

## 2022-05-25 LAB — COMPREHENSIVE METABOLIC PANEL
ALT: 17 IU/L (ref 0–44)
AST: 24 IU/L (ref 0–40)
Albumin/Globulin Ratio: 2.3 — ABNORMAL HIGH (ref 1.2–2.2)
Albumin: 4.5 g/dL (ref 3.7–4.7)
Alkaline Phosphatase: 82 IU/L (ref 44–121)
BUN/Creatinine Ratio: 21 (ref 10–24)
BUN: 29 mg/dL — ABNORMAL HIGH (ref 8–27)
Bilirubin Total: 0.6 mg/dL (ref 0.0–1.2)
CO2: 24 mmol/L (ref 20–29)
Calcium: 9.4 mg/dL (ref 8.6–10.2)
Chloride: 105 mmol/L (ref 96–106)
Creatinine, Ser: 1.36 mg/dL — ABNORMAL HIGH (ref 0.76–1.27)
Globulin, Total: 2 g/dL (ref 1.5–4.5)
Glucose: 90 mg/dL (ref 70–99)
Potassium: 4.3 mmol/L (ref 3.5–5.2)
Sodium: 144 mmol/L (ref 134–144)
Total Protein: 6.5 g/dL (ref 6.0–8.5)
eGFR: 50 mL/min/{1.73_m2} — ABNORMAL LOW (ref >59)

## 2022-05-25 LAB — LIPID PANEL
Chol/HDL Ratio: 4.8 ratio (ref 0.0–5.0)
Cholesterol, Total: 192 mg/dL (ref 100–199)
HDL: 40 mg/dL (ref >39)
LDL Chol Calc (NIH): 118 mg/dL — ABNORMAL HIGH (ref 0–99)
Triglycerides: 192 mg/dL — ABNORMAL HIGH (ref 0–149)
VLDL Cholesterol Cal: 34 mg/dL (ref 5–40)

## 2022-05-25 LAB — CARDIOVASCULAR RISK ASSESSMENT

## 2022-05-27 NOTE — Progress Notes (Signed)
Blood count normal.  Liver function normal.  Kidney function abnormal. Recommend decrease lasix to 40 mg 1/2 daily.  Recommend renal ultrasound. Cholesterol: LDL 118. Trigs 192. Low fat diet. Continue current medications.

## 2022-05-28 ENCOUNTER — Other Ambulatory Visit: Payer: Self-pay

## 2022-05-28 DIAGNOSIS — N289 Disorder of kidney and ureter, unspecified: Secondary | ICD-10-CM

## 2022-05-30 DIAGNOSIS — N289 Disorder of kidney and ureter, unspecified: Secondary | ICD-10-CM | POA: Diagnosis not present

## 2022-05-30 DIAGNOSIS — R9341 Abnormal radiologic findings on diagnostic imaging of renal pelvis, ureter, or bladder: Secondary | ICD-10-CM | POA: Diagnosis not present

## 2022-05-30 DIAGNOSIS — K8020 Calculus of gallbladder without cholecystitis without obstruction: Secondary | ICD-10-CM | POA: Diagnosis not present

## 2022-05-31 DIAGNOSIS — M7061 Trochanteric bursitis, right hip: Secondary | ICD-10-CM | POA: Diagnosis not present

## 2022-06-06 ENCOUNTER — Telehealth: Payer: Self-pay

## 2022-06-06 NOTE — Telephone Encounter (Signed)
Left message for patient to call our office for renal US results. Per Dr. Tobie Poet, "Kidney/bladder normal. Gallstones (13% general population have asymptomatic stones) Review biliary sxs to watch for. Spleen is a little large. No further w/up needed."

## 2022-06-07 ENCOUNTER — Other Ambulatory Visit: Payer: Self-pay

## 2022-06-07 DIAGNOSIS — N289 Disorder of kidney and ureter, unspecified: Secondary | ICD-10-CM

## 2022-06-08 ENCOUNTER — Other Ambulatory Visit: Payer: Self-pay

## 2022-06-08 DIAGNOSIS — K2100 Gastro-esophageal reflux disease with esophagitis, without bleeding: Secondary | ICD-10-CM

## 2022-06-08 MED ORDER — PANTOPRAZOLE SODIUM 40 MG PO TBEC: 40.0000 mg | DELAYED_RELEASE_TABLET | Freq: Every day | ORAL | 1 refills | Status: DC

## 2022-06-08 MED ORDER — AMMONIUM LACTATE 12 % EX LOTN: 1.0000 | TOPICAL_LOTION | Freq: Two times a day (BID) | CUTANEOUS | 0 refills | Status: DC

## 2022-06-13 DIAGNOSIS — M216X1 Other acquired deformities of right foot: Secondary | ICD-10-CM | POA: Diagnosis not present

## 2022-06-17 ENCOUNTER — Other Ambulatory Visit: Payer: Self-pay

## 2022-06-17 DIAGNOSIS — R600 Localized edema: Secondary | ICD-10-CM

## 2022-06-17 MED ORDER — FUROSEMIDE 40 MG PO TABS: 40.0000 mg | ORAL_TABLET | Freq: Every day | ORAL | 1 refills | Status: DC

## 2022-06-19 ENCOUNTER — Telehealth: Payer: Self-pay

## 2022-06-19 NOTE — Progress Notes (Signed)
Care Management & Coordination Services Pharmacy Team  Reason for Encounter: Appointment Reminder  Contacted patient to confirm telephone appointment with Arizona Constable, PharmD on 06/21/22 at 3:00 pm.  Spoke with patient on 06/19/2022   Do you have any problems getting your medications? No  What is your top health concern you would like to discuss at your upcoming visit? No top concerns   Have you seen any other providers since your last visit with PCP? Yes, going to see the dentist today for a root canal    Chart review:  Recent office visits:  05/24/22 Rochel Brome MD. Seen for HTN. Decreased Amlodipine to 5mg .  D/C Pregabalin 75mg .  Recent consult visits:  05/03/22 (Cardiology) Jacobo Forest RN. Orders Only. Started on Spironolactone 12.5mg .   04/30/22 (Cardiology) Jenne Campus MD. Seen for HTN. No med changes.   Hospital visits:  None   Star Rating Drugs:  Medication:  Last Fill: Day Supply Rosuvastatin   06/18/22-10/26/21 90ds Valsartan  03/29/22 90ds  Care Gaps: Annual wellness visit in last year? Yes  If Diabetic:None noted  Last eye exam / retinopathy screening: Last diabetic foot exam:   Elray Mcgregor, [place] Pharmacist Assistant  [phone]

## 2022-06-21 ENCOUNTER — Ambulatory Visit: Payer: HMO

## 2022-06-21 NOTE — Patient Outreach (Signed)
  Care Management   Follow Up Note   06/21/2022 Name: Jack Hodges MRN: [id number] DOB: 06-Feb-1931   Referred by: Rochel Brome, MD Reason for referral : Care Coordination   Successful contact was made with patient to discuss care management and care coordination services. Patient wishes to consider and/or discuss with PCP prior to consenting to or declining services.   Follow Up Plan: The patient has been provided with contact information for the care management team and has been advised to call with any health related questions or concerns.   Arizona Constable, Pharm.D. - [phone]    Patient was in the bathroom, wanted to reschedule. Forgot about appt

## 2022-07-06 ENCOUNTER — Telehealth: Payer: Self-pay

## 2022-07-06 NOTE — Progress Notes (Signed)
Care Management & Coordination Services Pharmacy Team  Reason for Encounter: Appointment Reminder  Contacted patient to confirm telephone appointment with Artelia Laroche, PharmD on 07/10/22 at 10:00 am.  Unsuccessful outreach. Left voicemail for patient to return call.  Chart review:  Recent office visits:  None  Recent consult visits:  None  Hospital visits:  None   Star Rating Drugs:  Medication:  Last Fill: Day Supply Rosuvastatin   10/26/21-07/27/21 90ds Valsartan   03/29/22  90ds  Care Gaps: Annual wellness visit in last year? Yes  If Diabetic:None noted  Last eye exam / retinopathy screening: Last diabetic foot exam:   Roxana Hires, [facility] Clinical Pharmacist Assistant  [phone]

## 2022-07-10 ENCOUNTER — Ambulatory Visit: Payer: HMO

## 2022-07-10 ENCOUNTER — Other Ambulatory Visit: Payer: Self-pay

## 2022-07-10 DIAGNOSIS — I110 Hypertensive heart disease with heart failure: Secondary | ICD-10-CM

## 2022-07-10 MED ORDER — EZETIMIBE 10 MG PO TABS: 10.0000 mg | ORAL_TABLET | Freq: Every day | ORAL | 1 refills | Status: DC

## 2022-07-10 MED ORDER — SERTRALINE HCL 50 MG PO TABS: 50.0000 mg | ORAL_TABLET | Freq: Every day | ORAL | 1 refills | Status: DC

## 2022-07-10 MED ORDER — CARBIDOPA-LEVODOPA 25-100 MG PO TABS: 1.0000 | ORAL_TABLET | Freq: Every evening | ORAL | 1 refills | Status: DC | PRN

## 2022-07-10 MED ORDER — VALSARTAN 160 MG PO TABS: 160.0000 mg | ORAL_TABLET | Freq: Every day | ORAL | 1 refills | Status: DC

## 2022-07-10 NOTE — Patient Outreach (Signed)
Care Management & Coordination Services Pharmacy Note  07/10/2022 Name:  Jack Hodges MRN:  [id number] DOB:  [birth year]  Summary: -Wife passed away apx 08-23-15, son passed away one year later. He has one son that lives with him and one daughter that lives beside of him. He is close with his family. His sister cooks meals for him regularly. He has friends at the cafe he visits regularly    Recommendations/Changes made from today's visit: -Patient has never filled Sertraline. Could try SNRI to help with mood/neuropathy  -Will mail son list of meds due to non-compliance -BP is elevated but he stopped Valsartan. Counseled to start taking (Please see note for BP)   Subjective: Jack Hodges is an 89 y.o. year old male who is a primary patient of Cox, Kirsten, MD.  The care coordination team was consulted for assistance with disease management and care coordination needs.    Engaged with patient by telephone for follow up visit.  Recent office visits:  05/24/22 Blane Ohara MD. Seen for HTN. Decreased Amlodipine to 5mg .  D/C Pregabalin 75mg .   Recent consult visits:  05/03/22 (Cardiology) Baldo Ash RN. Orders Only. Started on Spironolactone 12.5mg .    04/30/22 (Cardiology) Gypsy Balsam MD. Seen for HTN. No med changes.    Hospital visits:  None   Objective:  Lab Results  Component Value Date   CREATININE 1.36 (H) 05/24/2022   BUN 29 (H) 05/24/2022   EGFR 50 (L) 05/24/2022   GFRNONAA 78 05/09/2020   GFRAA 90 05/09/2020   NA 144 05/24/2022   K 4.3 05/24/2022   CALCIUM 9.4 05/24/2022   CO2 24 05/24/2022   GLUCOSE 90 05/24/2022    Lab Results  Component Value Date/Time   HGBA1C 4.8 10/23/2019 09:55 AM    Last diabetic Eye exam: No results found for: "HMDIABEYEEXA"  Last diabetic Foot exam: No results found for: "HMDIABFOOTEX"   Lab Results  Component Value Date   CHOL 192 05/24/2022   HDL 40 05/24/2022   LDLCALC 118 (H) 05/24/2022   TRIG 192 (H) 05/24/2022    CHOLHDL 4.8 05/24/2022       Latest Ref Rng & Units 05/24/2022    9:03 AM 01/11/2022   11:38 AM 12/01/2021   12:20 PM  Hepatic Function  Total Protein 6.0 - 8.5 g/dL 6.5  6.1  6.5   Albumin 3.7 - 4.7 g/dL 4.5  4.1  4.5   AST 0 - 40 IU/L 24  23  21    ALT 0 - 44 IU/L 17  14  16    Alk Phosphatase 44 - 121 IU/L 82  103  79   Total Bilirubin 0.0 - 1.2 mg/dL 0.6  0.9  0.8     No results found for: "TSH", "FREET4"     Latest Ref Rng & Units 05/24/2022    9:03 AM 01/11/2022   11:38 AM 12/01/2021   12:20 PM  CBC  WBC 3.4 - 10.8 x10E3/uL 9.1  10.0  10.7   Hemoglobin 13.0 - 17.7 g/dL 65.7  84.6  96.2   Hematocrit 37.5 - 51.0 % 47.5  36.0  43.6   Platelets 150 - 450 x10E3/uL 231  186  171     Lab Results  Component Value Date/Time   VITAMINB12 1,420 (H) 03/16/2020 12:52 AM   VITAMINB12 1,149 09/24/2019 08:50 AM    Clinical ASCVD: Yes  The ASCVD Risk score (Arnett DK, et al., 08/22/17) failed to calculate for the following reasons:  The 2019 ASCVD risk score is only valid for ages 59 to 23   The patient has a prior MI or stroke diagnosis    Other: (CHADS2VASc if Afib, MMRC or CAT for COPD, ACT, DEXA)     01/24/2022    8:26 PM 10/11/2021    8:43 AM 08/01/2021    2:17 PM  Depression screen PHQ 2/9  Decreased Interest 2 2 0  Down, Depressed, Hopeless 2 2 0  PHQ - 2 Score 4 4 0  Altered sleeping 2 3   Tired, decreased energy 1 2   Change in appetite 2 2   Feeling bad or failure about yourself  1 3   Trouble concentrating 0 0   Moving slowly or fidgety/restless 0 0   Suicidal thoughts 0 0   PHQ-9 Score 10 14   Difficult doing work/chores Not difficult at all Somewhat difficult      Social History   Tobacco Use  Smoking Status Former   Types: Cigarettes, Cigars   Quit date: 05/02/1962   Years since quitting: 60.2  Smokeless Tobacco Never   BP Readings from Last 3 Encounters:  05/24/22 110/60  04/30/22 (!) 164/66  04/06/22 (!) 168/60   Pulse Readings from Last 3 Encounters:   05/24/22 72  04/30/22 (!) 48  03/29/22 60   Wt Readings from Last 3 Encounters:  05/24/22 163 lb (73.9 kg)  04/30/22 169 lb 6.4 oz (76.8 kg)  03/29/22 165 lb (74.8 kg)   BMI Readings from Last 3 Encounters:  05/24/22 26.31 kg/m  04/30/22 27.34 kg/m  03/29/22 26.63 kg/m    Allergies  Allergen Reactions   Poison Oak Extract Rash    Medications Reviewed Today     Reviewed by Blane Ohara, MD (Physician) on 05/24/22 at [phone]  Med List Status: <None>   Medication Order Taking? Sig Documenting Provider Last Dose Status Informant  acetaminophen (TYLENOL) 650 MG CR tablet [id number] Yes Take 650 mg by mouth every 8 (eight) hours as needed for pain.  [provider] Taking Active Self  albuterol (VENTOLIN HFA) 108 (90 Base) MCG/ACT inhaler [id number] Yes Inhale 2 puffs into the lungs every 2 (two) hours as needed for wheezing or shortness of breath. Cox, Kirsten, MD Taking Active   amLODipine (NORVASC) 5 MG tablet [id number] Yes Take 5 mg by mouth daily. [provider] Taking Active   ascorbic acid (VITAMIN C) 500 MG tablet [id number] Yes Take 1 tablet (500 mg total) by mouth daily. Drema Dallas, MD Taking Active   aspirin EC 81 MG tablet [id number] Yes Take 1 tablet (81 mg total) by mouth daily. Swallow whole. Cox, Kirsten, MD Taking Active   carbidopa-levodopa (SINEMET) 25-100 MG tablet [id number] Yes Take 1 tablet by mouth at bedtime as needed (rESTLESS LEGS.). Cox, Kirsten, MD Taking Active   carvedilol (COREG) 12.5 MG tablet [id number] Yes Take 1 tablet (12.5 mg total) by mouth 2 (two) times daily. Baldo Daub, MD Taking Active   cholecalciferol (VITAMIN D3) 25 MCG (1000 UNIT) tablet [id number] Yes Take 1 tablet (1,000 Units total) by mouth daily. Cox, Kirsten, MD Taking Active   ezetimibe (ZETIA) 10 MG tablet [id number] Yes Take 1 tablet (10 mg total) by mouth daily. Cox, Kirsten, MD Taking Active   fluorometholone (FML) 0.1 % ophthalmic suspension [id number] Yes  Place 1 drop into both eyes daily. [provider] Taking Active   furosemide (LASIX) 40 MG tablet [id number] Yes Take 1 tablet (40 mg total) by mouth 2 (  two) times daily. Then go back to one pill a day  Patient taking differently: Take 40 mg by mouth daily.   Cox, Kirsten, MD Taking Active   nitroGLYCERIN (NITROSTAT) 0.4 MG SL tablet [id number] Yes Place 1 tablet (0.4 mg total) under the tongue every 5 (five) minutes as needed for chest pain. Jack Parker, PA-C Taking Active Self  pantoprazole (PROTONIX) 40 MG tablet [id number] Yes Take 1 tablet (40 mg total) by mouth daily. Cox, Kirsten, MD Taking Active   rosuvastatin (CRESTOR) 20 MG tablet [id number] Yes Take 1 tablet (20 mg total) by mouth at bedtime. Abigail Miyamoto, MD Taking Active   sertraline (ZOLOFT) 50 MG tablet [id number] Yes Take 1 tablet (50 mg total) by mouth daily. Cox, Kirsten, MD Taking Active   spironolactone (ALDACTONE) 25 MG tablet [id number] Yes Take 0.5 tablets (12.5 mg total) by mouth daily. Georgeanna Lea, MD Taking Active   triamcinolone acetonide [facility]) injection 60 mg [id number]   Abigail Miyamoto, MD  Active   valsartan (DIOVAN) 160 MG tablet [id number] Yes Take 1 tablet (160 mg total) by mouth daily. Cox, Kirsten, MD Taking Active   vitamin B-12 (CYANOCOBALAMIN) 500 MCG tablet [id number] Yes Take 500 mcg by mouth daily. [provider] Taking Active   zinc sulfate 220 (50 Zn) MG capsule [id number] Yes Take 1 capsule (220 mg total) by mouth daily. Cox, Fritzi Mandes, MD Taking Active             SDOH:  (Social Determinants of Health) assessments and interventions performed: Yes SDOH Interventions    Flowsheet Row Clinical Support from 01/24/2022 in [facility] Office Visit from 10/11/2021 in [facility] Chronic Care Management from 09/15/2021 in [facility] Chronic Care Management from 01/26/2021 in [facility] Clinical Support from 07/13/2020 in [facility]  SDOH Interventions       Food Insecurity Interventions -- -- -- -- Intervention Not Indicated  Housing Interventions -- -- -- -- Intervention Not Indicated  Transportation Interventions -- -- Intervention Not Indicated -- Intervention Not Indicated  Depression Interventions/Treatment  Currently on Treatment Currently on Treatment -- -- --  Financial Strain Interventions -- -- Intervention Not Indicated Intervention Not Indicated Intervention Not Indicated  Physical Activity Interventions -- -- -- -- Intervention Not Indicated  Stress Interventions -- -- -- -- Intervention Not Indicated  Social Connections Interventions -- -- -- -- Intervention Not Indicated      SDOH Screenings   Food Insecurity: No Food Insecurity (07/13/2020)  Housing: Low Risk  (07/13/2020)  Transportation Needs: No Transportation Needs (09/15/2021)  Alcohol Screen: Low Risk  (07/13/2020)  Depression (PHQ2-9): Medium Risk (01/24/2022)  Financial Resource Strain: Low Risk  (09/15/2021)  Physical Activity: Insufficiently Active (07/13/2020)  Social Connections: Moderately Isolated (07/13/2020)  Stress: No Stress Concern Present (07/13/2020)  Tobacco Use: Medium Risk (05/24/2022)    Medication Assistance: None required.  Patient affirms current coverage meets needs.    [facility] Reviewed: Is patient disadvantaged to use [facility]?: No  Current Rx insurance plan: HTA Name and location of Current pharmacy:  [facility]  [address] - [place], [place] - [address] [place] [address] Phone: [phone] Fax: [phone]  [facility]) - [place], [place] - [address] [place] [address] [place] [address] [place] [address] Phone: [phone] Fax: [phone]  [facility]  [address] [place] [address] Phone: [phone] Fax:  [phone]  [facility] - [place], [place] - [address] [place] [address] Phone: [phone] Fax: [phone]  [facility] - [place], [place] - [address] [place] [address] Phone: [phone] Fax: [phone]  [facility] services reviewed with patient today?: Yes  Patient requests to transfer care to [facility]?: Yes  -Verbal consent obtained for [facility] enhanced pharmacy services (medication synchronization, adherence packaging, delivery coordination). A medication sync plan was created to allow patient to get all medications delivered once every 30 to 90 days per patient preference. Patient understands they have freedom to choose pharmacy and clinical pharmacist will coordinate care between all prescribers and [facility].   Compliance/Adherence/Medication fill history: Star Rating Drugs:  Medication:                Last Fill:         Day Supply Rosuvastatin               10/26/21-07/27/21   [facility] Valsartan                     03/29/22              90ds   Care Gaps: Annual wellness visit in last year? [place]ita [place]   Assessment/Plan   Hypertension (BP goal <130/80) -Controlled -Current treatment: Carvedilol 12.5mg  BID Appropriate, Query effective, ,  Amlodipine 5mg  Appropriate, Query effective, ,  Furosemide 40mg  Appropriate, Effective, Safe, Accessible Valsartan 160mg  QD Appropriate, Query effective, ,  -Medications previously tried:  Metoprolol, Lisinopril -Current home readings:   November 2022: 01/26/21: 139/88 and 75 01/25/21: 128/86  March 2023: 06/07/21: 130/86  06/06/21: 141/82, 171/87 06/05/21: 191/110 06/04/21: 178/94 June 2023: 09/15/21: 119/73 09/14/21: 162/86 09/13/21: 174/83 09/12/21: 118/69 09/11/21: 114/72 Jan 2024: 213/77 168/134 210/? April 2024: 07/10/22: 136/94 Pulse 63 176/93 Pulse: 63 168/65 Pulse: 63 162/65  Pulse: 65 138/72 Pulse; 67 -Current dietary habits: eats with his sister -Current exercise habits: walks around house with a cane -Denies hypotensive/hypertensive symptoms -Educated on BP goals and benefits of medications for prevention of heart attack, stroke and kidney damage; Daily salt intake goal < 2300 mg; Exercise goal of 150 minutes per week; -Counseled to monitor BP at home daily, document, and provide log at future appointments -Counseled on diet and exercise extensively March 2023: BP elevated, will let Cardio know ASAP. Told patient to call me everyday his BP is above goal June 2023: BP still elevated, also patient is non-compliant on Metoprolol. Let Cardio know Jan 2024: Patient stopped Amlodipine last week. Thought that's what he was supposed to stop, not lisinopril. Counseled to start taking again and sent direct msg to Cardio regarding BP April 2024: Patient's BP is elevated but he stopped Valsartan after a few weeks. Counseled to start taking again. Will have team call for BP in 2 weeks -Patient non-compliant. Spoke with Mikle Bosworth at [facility] who will start synching meds and I gave him of list of things he takes   Hyperlipidemia/CAD: (LDL goal < 55) The ASCVD Risk score (Arnett DK, et al., 2019) failed to calculate for the following reasons:   The 2019 ASCVD risk score is only valid for ages 67 to 22   The patient has a prior MI or stroke diagnosis Lab Results  Component Value Date   CHOL 192 05/24/2022  CHOL 126 10/11/2021   CHOL 166 07/27/2021   Lab Results  Component Value Date   HDL 40 05/24/2022   HDL 43 10/11/2021   HDL 47 07/27/2021   Lab Results  Component Value Date   LDLCALC 118 (H) 05/24/2022   LDLCALC 58 10/11/2021   LDLCALC 99 07/27/2021   Lab Results  Component Value Date   TRIG 192 (H) 05/24/2022   TRIG 147 10/11/2021   TRIG 109 07/27/2021   Lab Results  Component Value Date   CHOLHDL 4.8 05/24/2022   CHOLHDL 2.9 10/11/2021    CHOLHDL 3.5 07/27/2021   No results found for: "LDLDIRECT" Last vitamin D No results found for: "25OHVITD2", "25OHVITD3", "VD25OH" No results found for: "TSH" -Controlled -Current treatment: rosuvastatin 20 mg daily Appropriate, Effective, Safe, Accessible Ezetimibe  Appropriate, Query effective Aspirin 81 mg daily Appropriate, Effective, Safe, Accessible -Medications previously tried: Clopidogrel -Current dietary patterns: eats with his sister some  -Current exercise habits: has foot drop and walks with a cane -Educated on Cholesterol goals;  Benefits of statin for ASCVD risk reduction; Importance of limiting foods high in cholesterol; -Counseled on diet and exercise extensively April 2024: Never took Ezetimibe, will send script to Pharmacy. Spoke with Mikle [facility] at [facility] who will fill this     Depression/Anxiety -Controlled -Current treatment: Sertraline  Appropriate, Query effective,  April 2024: Never filled -Medications previously tried/failed:  -PHQ9:     01/24/2022    8:26 PM 10/11/2021    8:43 AM 08/01/2021    2:17 PM  Depression screen PHQ 2/9  Decreased Interest 2 2 0  Down, Depressed, Hopeless 2 2 0  PHQ - 2 Score 4 4 0  Altered sleeping 2 3   Tired, decreased energy 1 2   Change in appetite 2 2   Feeling bad or failure about yourself  1 3   Trouble concentrating 0 0   Moving slowly or fidgety/restless 0 0   Suicidal thoughts 0 0   PHQ-9 Score 10 14   Difficult doing work/chores Not difficult at all Somewhat difficult    GAD7:     No data to display         -Educated on Benefits of medication for symptom control November 2022: Will ask PCP to change to SNRI for neuropathy March 2023: Will ask PCP to change to SNRI for neuropathy, didn't hear back from last recommendation June 2023: Will ask PCP to change to SNRI for neuropathy, didn't hear back from last recommendation April 2024: Will ask PCP to change to SNRI   Chronic  Pain -Uncontrolled -Current treatment: Gabapentin Appropriate, Effective, Safe, Accessible -Medications previously tried: N/A  -Pain Scale Without Meds: 6/10  With Meds: 6/10  Aggravating Factors: Movement  Pain Type: Neuropathy  November 2022: Will ask for SNRI to help March 2023: Will ask PCP to change to SNRI for neuropathy, didn't hear back from last recommendation June 2023: Coordinated to get patient consult for Neuro. He stated he spoke with a friend who said, "Don't go to a neurologist, all they do is prescribe you Gabapentin and send you on your way." He is not interested in going anymore   Tremor (Goal: Decrease Symptoms) -Uncontrolled -Current treatment  None -Medications previously tried: None  March 2023: Will coordinate with team to get Neuro referral sent. Patient has fallen multiple times and with his DAPT this could be harmful June 2023: Not interested in Neuro but off DAPT  F/U July 2024  Artelia Laroche, Pharm.D. - (  [phone]   

## 2022-07-24 ENCOUNTER — Telehealth: Payer: Self-pay

## 2022-07-24 NOTE — Progress Notes (Cosign Needed Addendum)
Per Harrold Donath: "This is weird but can we please mail the son a list of his medications? Keymarion is very non-compliant and they live together and the son doesn't know what Olanda is and isn't supposed to take. Can you also call in 2 weeks and get some BP readings? Thank you!!"  4/17- Mailed list of medications  07/24/22-1st attempt-no answer, 2nd attempt no answer, 3rd attempt lvm  Pt returned call and gave BP AM and PM readings. Son stated he isnt sure if pt is taking all his medications. Son will get with his dad to see if that medications list has come in and go through all his medications and what bottles he has. He is going to start marking the bottles so his dad knows what they are for. I stated if he needs help going through them, to call me back and son understood.   07/19/22 151/87 83, 119/48 66 07/20/22 147/99 97, 168/125 81 07/21/22 158/68 68, 162/79 61 07/22/22 168/61 126, 164/73 77 07/23/22 154/73 81, 154/121 70 07/24/22 152/65 76   Roxana Hires, CMA Programme researcher, broadcasting/film/video  [phone]

## 2022-07-25 ENCOUNTER — Telehealth: Payer: Self-pay

## 2022-07-25 NOTE — Telephone Encounter (Signed)
Patient's son called to let yall know that the only medication that patient is not taking for his Blood pressure is the Valsartan 160 mg. He states that he does not want to take that one because it is high powered. They wanted me to pass along the message to let you know.

## 2022-07-27 ENCOUNTER — Telehealth: Payer: Self-pay

## 2022-07-30 DIAGNOSIS — Z87891 Personal history of nicotine dependence: Secondary | ICD-10-CM | POA: Diagnosis not present

## 2022-07-30 DIAGNOSIS — J811 Chronic pulmonary edema: Secondary | ICD-10-CM | POA: Diagnosis not present

## 2022-07-30 DIAGNOSIS — Z955 Presence of coronary angioplasty implant and graft: Secondary | ICD-10-CM | POA: Diagnosis not present

## 2022-07-30 DIAGNOSIS — I2510 Atherosclerotic heart disease of native coronary artery without angina pectoris: Secondary | ICD-10-CM | POA: Diagnosis not present

## 2022-07-30 DIAGNOSIS — R0600 Dyspnea, unspecified: Secondary | ICD-10-CM | POA: Diagnosis not present

## 2022-07-30 DIAGNOSIS — E785 Hyperlipidemia, unspecified: Secondary | ICD-10-CM | POA: Diagnosis not present

## 2022-07-30 DIAGNOSIS — I509 Heart failure, unspecified: Secondary | ICD-10-CM | POA: Diagnosis not present

## 2022-07-30 DIAGNOSIS — I517 Cardiomegaly: Secondary | ICD-10-CM | POA: Diagnosis not present

## 2022-07-30 DIAGNOSIS — N179 Acute kidney failure, unspecified: Secondary | ICD-10-CM | POA: Diagnosis not present

## 2022-07-30 DIAGNOSIS — R0989 Other specified symptoms and signs involving the circulatory and respiratory systems: Secondary | ICD-10-CM | POA: Diagnosis not present

## 2022-07-30 DIAGNOSIS — Z951 Presence of aortocoronary bypass graft: Secondary | ICD-10-CM | POA: Diagnosis not present

## 2022-07-30 DIAGNOSIS — I110 Hypertensive heart disease with heart failure: Secondary | ICD-10-CM | POA: Diagnosis not present

## 2022-07-30 NOTE — Telephone Encounter (Cosign Needed)
I will message Cardiology and inform them of BP.  Roxana Hires, CMA Clinical Pharmacist Assistant  [phone]

## 2022-07-31 ENCOUNTER — Other Ambulatory Visit: Payer: Self-pay | Admitting: Family Medicine

## 2022-08-01 NOTE — Telephone Encounter (Signed)
Spoke with son and patient about new medication

## 2022-08-03 ENCOUNTER — Other Ambulatory Visit: Payer: Self-pay | Admitting: Physician Assistant

## 2022-08-03 MED ORDER — OLMESARTAN MEDOXOMIL 40 MG PO TABS: 40.0000 mg | ORAL_TABLET | Freq: Every day | ORAL | 0 refills | Status: DC

## 2022-08-08 ENCOUNTER — Other Ambulatory Visit (HOSPITAL_COMMUNITY): Payer: Self-pay

## 2022-08-08 MED ORDER — QUETIAPINE FUMARATE 25 MG PO TABS
25.0000 mg | ORAL_TABLET | Freq: Every day | ORAL | 10 refills | Status: DC
  Filled 2022-08-08: qty 15, 15d supply, fill #0

## 2022-08-09 ENCOUNTER — Telehealth: Payer: Self-pay

## 2022-08-09 NOTE — Progress Notes (Cosign Needed Addendum)
Care Management & Coordination Services Pharmacy Team  Reason for Encounter: Hypertension  Contacted patient to discuss hypertension disease state. Spoke with patient on 08/09/2022     Current antihypertensive regimen:  Carvedilol 12.5 mg BID  Amlodipine 5 mg daily Olmesartan 40 mg daily Spironolactone 25 mg take half tablet daily   Patient verbally confirms he is taking the above medications as directed. Yes  How often are you checking your Blood Pressure? twice daily   he checks his blood pressure in the morning before taking his medication.  Current home BP readings: 05-16 189/75 83, 05-15 152/72 79 170/73 63, 05-14 176/73 89 173/81 73 , 05-13 187/81 71 162/61 79, 05-12 144/62 76 176/84 66. Wrist or arm cuff: Arm Caffeine intake: 3 cups of coffee daily Salt intake: Limited OTC medications including pseudoephedrine or NSAIDs? None  Any readings above 180/100? Yes If yes any symptoms of hypertensive emergency? shortness of breath. Patient stated he uses inhaler sometimes and other symptoms are hard to describe. He doesn't feel good.  What recent interventions/DTPs have been made by any provider to improve Blood Pressure control since last CPP Visit: None  Any recent hospitalizations or ED visits since last visit with CPP? Yes  What diet changes have been made to improve Blood Pressure Control?  Patient stated he doesn't use much salt. Patient stated he doesn't drink much water during the day.   What exercise is being done to improve your Blood Pressure Control?  Patient stated he walks on his treadmill some days and cleans the yard.  Adherence Review: Is the patient currently on ACE/ARB medication? Yes Does the patient have >5 day gap between last estimated fill dates? No  Star Rating Drugs:  Olmesartan 40 mg- Last filled 08-03-2022 90 DS. No previous fills. Rosuvastatin 20 mg- Last filled 07-10-2022 90 DS. Previous 10-26-2021 90 DS.  Chart Updates: Recent office  visits:  None  Recent consult visits:  None  Hospital visits:  Medication Reconciliation was completed by comparing discharge summary, patient's EMR and Pharmacy list, and upon discussion with patient.  Admitted to the hospital on 07-30-2022 due to Shortness of breath. Discharge date was 07-30-2022. Discharged from [facility]    New?Medications Started at [facility] Discharge:?? Meclizine 25 mg TID PRN  Medication Changes at Hospital Discharge: None  Medications Discontinued at Hospital Discharge: None  Medications that remain the same after Hospital Discharge:??  -All other medications will remain the same.    Medications: Outpatient Encounter Medications as of 08/09/2022  Medication Sig   acetaminophen (TYLENOL) 650 MG CR tablet Take 650 mg by mouth every 8 (eight) hours as needed for pain.    albuterol (VENTOLIN HFA) 108 (90 Base) MCG/ACT inhaler Inhale 2 puffs into the lungs every 2 (two) hours as needed for wheezing or shortness of breath.   amLODipine (NORVASC) 5 MG tablet Take 5 mg by mouth daily.   ammonium lactate (LAC-HYDRIN) 12 % lotion Apply 1 Application topically 2 (two) times daily. Apply twice daily to arms and hands to increase moisture and reduce easy bruising.  Can also be used safely on neck, chest, and legs as needed   ascorbic acid (VITAMIN C) 500 MG tablet Take 1 tablet (500 mg total) by mouth daily.   aspirin EC 81 MG tablet Take 1 tablet (81 mg total) by mouth daily. Swallow whole.   carbidopa-levodopa (SINEMET) 25-100 MG tablet Take 1 tablet by mouth at bedtime as needed (rESTLESS LEGS.).   carvedilol (COREG) 12.5 MG tablet  Take 1 tablet (12.5 mg total) by mouth 2 (two) times daily.   cholecalciferol (VITAMIN D3) 25 MCG (1000 UNIT) tablet Take 1 tablet (1,000 Units total) by mouth daily.   ezetimibe (ZETIA) 10 MG tablet Take 1 tablet (10 mg total) by mouth daily.   fluorometholone (FML) 0.1 % ophthalmic suspension Place 1 drop into both eyes  daily.   furosemide (LASIX) 40 MG tablet Take 1 tablet (40 mg total) by mouth daily.   nitroGLYCERIN (NITROSTAT) 0.4 MG SL tablet Place 1 tablet (0.4 mg total) under the tongue every 5 (five) minutes as needed for chest pain.   olmesartan (BENICAR) 40 MG tablet Take 1 tablet (40 mg total) by mouth daily.   pantoprazole (PROTONIX) 40 MG tablet Take 1 tablet (40 mg total) by mouth daily.   rosuvastatin (CRESTOR) 20 MG tablet Take 1 tablet (20 mg total) by mouth at bedtime.   sertraline (ZOLOFT) 50 MG tablet Take 1 tablet (50 mg total) by mouth daily.   spironolactone (ALDACTONE) 25 MG tablet Take 0.5 tablets (12.5 mg total) by mouth daily.   vitamin B-12 (CYANOCOBALAMIN) 500 MCG tablet Take 500 mcg by mouth daily.   zinc sulfate 220 (50 Zn) MG capsule Take 1 capsule (220 mg total) by mouth daily.   [DISCONTINUED] QUEtiapine (SEROQUEL) 25 MG tablet Take 1 tablet (25 mg total) by mouth at bedtime.   Facility-Administered Encounter Medications as of 08/09/2022  Medication   triamcinolone acetonide (KENALOG-40) injection 60 mg    Recent Office Vitals: BP Readings from Last 3 Encounters:  05/24/22 110/60  04/30/22 (!) 164/66  04/06/22 (!) 168/60   Pulse Readings from Last 3 Encounters:  05/24/22 72  04/30/22 (!) 48  03/29/22 60    Wt Readings from Last 3 Encounters:  05/24/22 163 lb (73.9 kg)  04/30/22 169 lb 6.4 oz (76.8 kg)  03/29/22 165 lb (74.8 kg)     Kidney Function Lab Results  Component Value Date/Time   CREATININE 1.36 (H) 05/24/2022 09:03 AM   CREATININE 1.35 (H) 05/18/2022 10:31 AM   GFRNONAA 78 05/09/2020 08:26 AM   GFRNONAA >60 03/17/2020 12:44 AM   GFRAA 90 05/09/2020 08:26 AM       Latest Ref Rng & Units 05/24/2022    9:03 AM 05/18/2022   10:31 AM 05/10/2022    8:13 AM  BMP  Glucose 70 - 99 mg/dL 90  68  87   BUN 8 - 27 mg/dL 29  23  21    Creatinine 0.76 - 1.27 mg/dL 1.61  0.96  0.45   BUN/Creat Ratio 10 - 24 21  17  16    Sodium 134 - 144 mmol/L 144  140  142    Potassium 3.5 - 5.2 mmol/L 4.3  4.5  4.4   Chloride 96 - 106 mmol/L 105  104  103   CO2 20 - 29 mmol/L 24  26  24    Calcium 8.6 - 10.2 mg/dL 9.4  9.4  9.2      [address] [facility] CMA Clinical Pharmacist Assistant [phone]

## 2022-08-10 NOTE — Telephone Encounter (Cosign Needed)
Patient notified and will have about a months worth of meds left. Are you able to send in new prescriptions to pharmacy or wait?

## 2022-08-14 ENCOUNTER — Other Ambulatory Visit: Payer: Self-pay

## 2022-08-14 MED ORDER — CARVEDILOL 25 MG PO TABS: 25.0000 mg | ORAL_TABLET | Freq: Two times a day (BID) | ORAL | 3 refills | Status: DC

## 2022-08-14 MED ORDER — SPIRONOLACTONE 25 MG PO TABS: 25.0000 mg | ORAL_TABLET | Freq: Once | ORAL | 3 refills | Status: DC

## 2022-08-17 NOTE — Telephone Encounter (Cosign Needed)
Patient didn't get medications because he wanted to finished what he had on hand. The pharmacy wrote down instructions to take home and said they will fill once old supply is out. Confirmed with both patient and pharmacy BUT patient read instructions wrong for carvedilol he has been taking 12.5 mg twice daily. Instructed patient to take 2 tablets twice daily. Confirmed he is taking 25 mg of spironolactone. Will call patient for readings in a week.

## 2022-08-22 NOTE — Telephone Encounter (Cosign Needed)
Prescriptions were sent in already. Thank you

## 2022-08-31 ENCOUNTER — Other Ambulatory Visit: Payer: Self-pay

## 2022-08-31 ENCOUNTER — Other Ambulatory Visit: Payer: Self-pay | Admitting: Family Medicine

## 2022-08-31 DIAGNOSIS — R600 Localized edema: Secondary | ICD-10-CM

## 2022-08-31 MED ORDER — FUROSEMIDE 40 MG PO TABS
40.0000 mg | ORAL_TABLET | Freq: Every day | ORAL | 0 refills | Status: DC
Start: 2022-08-31 — End: 2022-12-06

## 2022-09-06 ENCOUNTER — Ambulatory Visit: Payer: HMO | Admitting: Family Medicine

## 2022-09-24 ENCOUNTER — Ambulatory Visit: Payer: HMO | Admitting: Family Medicine

## 2022-10-11 ENCOUNTER — Encounter: Payer: Self-pay | Admitting: Pharmacist

## 2022-10-11 NOTE — Progress Notes (Signed)
Patient previously followed by [facility] pharmacist. Per clinical review, no pharmacist appointment needed at this time.

## 2022-11-06 ENCOUNTER — Telehealth: Payer: Self-pay | Admitting: Pharmacist

## 2022-11-06 ENCOUNTER — Other Ambulatory Visit: Payer: Self-pay | Admitting: Family Medicine

## 2022-11-06 MED ORDER — OLMESARTAN MEDOXOMIL 40 MG PO TABS: 40.0000 mg | ORAL_TABLET | Freq: Every day | ORAL | 1 refills | Status: DC

## 2022-11-06 NOTE — Progress Notes (Signed)
Pharmacy Quality Measure Review  This patient is appearing on a report for being at risk of failing the adherence measure for hypertension (ACEi/ARB) medications this calendar year.   Medication: olmesartan 40 mg  Last fill date: 5/1 for 90 day supply. This last prescription was sent with no refills.   Called patient. Assisted in helping him reschedule next appointment with Dr. Sedalia Muta. He agrees that he is due for a refill on olmesartan. Will collaborate with PCP to send refill.   Catie Eppie Gibson, PharmD, BCACP, CPP Clinical Pharmacist [facility] [phone]

## 2022-11-14 ENCOUNTER — Telehealth: Payer: Self-pay

## 2022-11-14 ENCOUNTER — Other Ambulatory Visit: Payer: Self-pay | Admitting: Family Medicine

## 2022-11-14 NOTE — Telephone Encounter (Signed)
Patient came in requesting refill of Lyrica be sent to [facility] in [place].

## 2022-11-15 ENCOUNTER — Other Ambulatory Visit: Payer: Self-pay

## 2022-11-15 NOTE — Telephone Encounter (Signed)
Patient had previously reported that he was not taking this and it was in his medication history. Put in RX refil request.

## 2022-11-23 ENCOUNTER — Ambulatory Visit: Payer: HMO | Attending: Cardiology

## 2022-11-23 DIAGNOSIS — R0609 Other forms of dyspnea: Secondary | ICD-10-CM | POA: Diagnosis not present

## 2022-11-23 DIAGNOSIS — I110 Hypertensive heart disease with heart failure: Secondary | ICD-10-CM | POA: Diagnosis not present

## 2022-11-23 LAB — ECHOCARDIOGRAM COMPLETE
AR max vel: 1.57 cm2
AV Area VTI: 1.53 cm2
AV Area mean vel: 1.55 cm2
AV Mean grad: 7.2 mmHg
AV Peak grad: 13.2 mmHg
Ao pk vel: 1.82 m/s
Area-P 1/2: 4.41 cm2
MV M vel: 3.87 m/s
MV Peak grad: 59.9 mmHg
S' Lateral: 3.7 cm

## 2022-11-30 ENCOUNTER — Encounter: Payer: Self-pay | Admitting: Cardiology

## 2022-11-30 ENCOUNTER — Ambulatory Visit: Payer: HMO | Attending: Cardiology | Admitting: Cardiology

## 2022-11-30 VITALS — BP 150/86 | HR 67 | Ht 66.0 in | Wt 165.0 lb

## 2022-11-30 DIAGNOSIS — I340 Nonrheumatic mitral (valve) insufficiency: Secondary | ICD-10-CM | POA: Diagnosis not present

## 2022-11-30 DIAGNOSIS — E785 Hyperlipidemia, unspecified: Secondary | ICD-10-CM | POA: Diagnosis not present

## 2022-11-30 DIAGNOSIS — R296 Repeated falls: Secondary | ICD-10-CM

## 2022-11-30 DIAGNOSIS — R5383 Other fatigue: Secondary | ICD-10-CM

## 2022-11-30 DIAGNOSIS — I10 Essential (primary) hypertension: Secondary | ICD-10-CM

## 2022-11-30 DIAGNOSIS — I5033 Acute on chronic diastolic (congestive) heart failure: Secondary | ICD-10-CM

## 2022-11-30 DIAGNOSIS — Z8673 Personal history of transient ischemic attack (TIA), and cerebral infarction without residual deficits: Secondary | ICD-10-CM

## 2022-11-30 DIAGNOSIS — Z951 Presence of aortocoronary bypass graft: Secondary | ICD-10-CM | POA: Diagnosis not present

## 2022-11-30 DIAGNOSIS — I4819 Other persistent atrial fibrillation: Secondary | ICD-10-CM | POA: Diagnosis not present

## 2022-11-30 DIAGNOSIS — Z952 Presence of prosthetic heart valve: Secondary | ICD-10-CM

## 2022-11-30 DIAGNOSIS — R0609 Other forms of dyspnea: Secondary | ICD-10-CM

## 2022-11-30 HISTORY — DX: Other persistent atrial fibrillation: I48.19

## 2022-11-30 NOTE — Addendum Note (Signed)
Addended by: Baldo Ash D on: 11/30/2022 02:12 PM   Modules accepted: Orders

## 2022-11-30 NOTE — Addendum Note (Signed)
Addended by: Baldo Ash D on: 11/30/2022 02:26 PM   Modules accepted: Orders

## 2022-11-30 NOTE — Progress Notes (Signed)
Cardiology Office Note:    Date:  11/30/2022   ID:  Jack Hodges, DOB 01/14/1931, MRN [id number]  PCP:  Blane Ohara, MD  Cardiologist:  Gypsy Balsam, MD    Referring MD: Blane Ohara, MD   Chief Complaint  Patient presents with   Shortness of Breath    Ongoing for months    History of Present Illness:    Jack Hodges is a 89 y.o. male    with past medical history significant for coronary artery disease, in 1996 he required coronary artery bypass graft.  Also in summer 2020 when he got cardiac catheterization done he underwent PTCA and stenting of left anterior descending artery in view of the fact that he got completely occluded LIMA going to LAD.  That cardiac catheterization was done in preparation for TAVI that eventually ended up having done on 10/27/2019.  He received Edwards sapiens 3 26 mm valve.  He also got history of bilateral foot drop, essential hypertension, dyslipidemia.   Comes today to months for follow-up.  He is not doing well he complained of being weak tired exhausted having shortness of breath.  He said he can just sit in the chair then he is fine anytime he does thinks he will get short of breath.  He also described to have some pain in the chest but cannot specifically tell me when it happens.  He does have some dementia  Past Medical History:  Diagnosis Date   Acute bursitis of left shoulder 01/04/2020   Acute on chronic diastolic congestive heart failure (HCC) 11/13/2021   Last Assessment & Plan:  Formatting of this note is different from the original. Pertinent Data:   Current medication includes: furosemide - 20 mg lisinopriL - 10 mg metoprolol succinate - 25 mg.  BNP (pg/mL)  Date Value  11/15/2021 433.6 (H)   eGFR (mL/min/1.50m*2)  Date Value  11/15/2021 >60.0    BP Readings from Last 3 Encounters:  11/30/21 (!) 147/82  11/15/21 (!) 109/54  11/13/21 (!) 134/74     Acute respiratory failure with hypoxia (HCC) 03/17/2020   Angina pectoris (HCC) 10/07/2019    Arthritis    Atherosclerosis of aorta (HCC) 09/24/2019   Bilateral foot-drop 01/18/2017   BMI 27.0-27.9,adult 09/24/2019   Coronary artery disease involving native coronary artery of native heart without angina pectoris 11/01/2015   Overview:  Bypass surgery in 1996 Overview:  Overview:  Bypass surgery in 1996  Last Assessment & Plan:  Continue his current regimen.  Follow up as noted.   CVA (cerebral vascular accident) (HCC) 04/03/2016   Last Assessment & Plan:  The patient underwent extensive CVA workup.  MRI is negative.  He does have aortic stenosis by echocardiogram.  Upon review of Care everywhere he is followed by Dr. Bing Matter, cardiologist for this.  Carotid ultrasound negative for hemodynamically significant stenosis.  Lipid panel within normal limits.  He has not had any further episodes of dizziness or nausea vomiting.    Demand ischemia 11/14/2021   Last Assessment & Plan:  Formatting of this note might be different from the original. Troponin flat No anginal symptoms Formatting of this note might be different from the original. Last Assessment & Plan:  Formatting of this note might be different from the original. Troponin flat No anginal symptoms   Dementia (HCC)    Depression    DNR (do not resuscitate) 04/03/2016   Overview:  I had an extensive discussion with the patient on 04/03/2016 regarding his end  of life wishes.  He and his daughter agree that do not resuscitate is in keeping with his beliefs.   Dyslipidemia 11/01/2015   Last Assessment & Plan:  Formatting of this note might be different from the original. Cholesterol 136   Triglycerides  83  HDL  50.1  LDL Calculated  69  VLDL Cholesterol Cal  17   Continue statin.   Dyspnea    with activity   Essential hypertension 11/01/2015   Last Assessment & Plan:  Resume home medications.  Follow-up as noted above.   Generalized weakness 03/13/2020   GERD (gastroesophageal reflux disease)    Herpes zoster 05/19/2019   History of  aortic stenosis 03/11/2020   History of CVA (cerebrovascular accident) 11/14/2021   Last Assessment & Plan:  Formatting of this note might be different from the original. Neurologically intact upon assessment -continue aspirin, statin   Hypertensive heart disease with heart failure (HCC) 11/01/2015   Last Assessment & Plan:  Resume home medications.  Follow-up as noted above.   Myocardial infarction (HCC)    approx 1994   Neuropathy    Peripheral neuropathy 11/14/2021   Last Assessment & Plan:  Formatting of this note might be different from the original. Bilateral lower extremity cool to touch with diminished sensation Patient reports findings are chronic Continue home dose Lyrica Formatting of this note might be different from the original. Last Assessment & Plan:  Formatting of this note might be different from the original. 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   Thrombocytopenia (HCC) 03/13/2020   Trochanteric bursitis of left hip 01/19/2021    Past Surgical History:  Procedure Laterality Date   CARDIAC CATHETERIZATION     CARDIAC SURGERY     CATARACT EXTRACTION, BILATERAL     CORONARY ARTERY BYPASS GRAFT     CORONARY STENT INTERVENTION N/A 10/07/2019   Procedure: CORONARY STENT INTERVENTION;  Surgeon: Tonny Bollman, MD;  Location: [facility] INVASIVE CV LAB;  Service: Cardiovascular;  Laterality: N/A;   EYE SURGERY     HEMORROIDECTOMY     RIGHT/LEFT HEART CATH AND CORONARY/GRAFT ANGIOGRAPHY N/A 10/07/2019   Procedure: RIGHT/LEFT HEART CATH AND  CORONARY/GRAFT ANGIOGRAPHY;  Surgeon: Tonny Bollman, MD;  Location: [facility] INVASIVE CV LAB;  Service: Cardiovascular;  Laterality: N/A;   TEE WITHOUT CARDIOVERSION N/A 10/27/2019   Procedure: TRANSESOPHAGEAL ECHOCARDIOGRAM (TEE);  Surgeon: Kathleene Hazel, MD;  Location: [facility] INVASIVE CV LAB;  Service: Open Heart Surgery;  Laterality: N/A;   TRANSCATHETER AORTIC VALVE REPLACEMENT, TRANSFEMORAL N/A 10/27/2019   Procedure: TRANSCATHETER AORTIC VALVE REPLACEMENT, TRANSFEMORAL;  Surgeon: Kathleene Hazel, MD;  Location: [facility] INVASIVE CV LAB;  Service: Open Heart Surgery;  Laterality: N/A;    Current Medications: Current Meds  Medication Sig   acetaminophen (TYLENOL) 650 MG CR tablet Take 650 mg by mouth every 8 (eight) hours as needed for pain.    albuterol (VENTOLIN HFA) 108 (90 Base) MCG/ACT inhaler Inhale 2 puffs into the lungs every 2 (two) hours as needed for wheezing or shortness of breath.   amLODipine (  NORVASC) 5 MG tablet Take 5 mg by mouth daily.   ammonium lactate (LAC-HYDRIN) 12 % lotion Apply 1 Application topically 2 (two) times daily. Apply twice daily to arms and hands to increase moisture and reduce easy bruising.  Can also be used safely on neck, chest, and legs as needed   ascorbic acid (VITAMIN C) 500 MG tablet Take 1 tablet (500 mg total) by mouth daily.   aspirin EC 81 MG tablet Take 1 tablet (81 mg total) by mouth daily. Swallow whole.   carbidopa-levodopa (SINEMET) 25-100 MG tablet Take 1 tablet by mouth at bedtime as needed (rESTLESS LEGS.).   carvedilol (COREG) 25 MG tablet Take 1 tablet (25 mg total) by mouth 2 (two) times daily.   cholecalciferol (VITAMIN D3) 25 MCG (1000 UNIT) tablet Take 1 tablet (1,000 Units total) by mouth daily.   ezetimibe (ZETIA) 10 MG tablet Take 1 tablet (10 mg total) by mouth daily.   fluorometholone (FML) 0.1 % ophthalmic suspension Place 1 drop into both eyes daily.   furosemide (LASIX) 40 MG tablet Take 1 tablet (40 mg total) by mouth  daily.   nitroGLYCERIN (NITROSTAT) 0.4 MG SL tablet Place 1 tablet (0.4 mg total) under the tongue every 5 (five) minutes as needed for chest pain.   olmesartan (BENICAR) 40 MG tablet Take 1 tablet (40 mg total) by mouth daily.   pantoprazole (PROTONIX) 40 MG tablet Take 1 tablet (40 mg total) by mouth daily.   rosuvastatin (CRESTOR) 20 MG tablet Take 1 tablet (20 mg total) by mouth at bedtime.   sertraline (ZOLOFT) 50 MG tablet Take 1 tablet (50 mg total) by mouth daily.   spironolactone (ALDACTONE) 25 MG tablet Take 1 tablet (25 mg total) by mouth once for 1 dose.   vitamin B-12 (CYANOCOBALAMIN) 500 MCG tablet Take 500 mcg by mouth daily.   zinc sulfate 220 (50 Zn) MG capsule Take 1 capsule (220 mg total) by mouth daily.   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  He has one son that lives with him and one daughter that lives beside of him. He is close with his family.  His sister cooks meals for him regularly.  He has friends at the cafe he visits regularly.   Social Determinants of Health    Financial Resource Strain: Low Risk  (11/13/2021)   Received from [place] [facility] [place], [facility] [employer] (CARDIA)  Difficulty of Paying Living Expenses: Not hard at all  Food Insecurity: No Food Insecurity (11/13/2021)   Received from [place] [facility] the [place], [facility] the [employer] Vital Sign    Worried About Programme researcher, broadcasting/film/video in the Last Year: Never true    Ran Out of Food in the Last Year: Never true  Transportation Needs: No Transportation Needs (11/13/2021)   Received from [place] [facility] the [place], [facility] the [employer] (Medical): No    Lack of Transportation (Non-Medical): No  Physical Activity: Insufficiently Active (07/13/2020)   Exercise Vital Sign    Days of Exercise per Week: 2 days    Minutes of Exercise per Session: 10 min  Stress: No Stress Concern Present (07/13/2020)   [employer] of Occupational Health - Occupational Stress Questionnaire    Feeling of Stress : Not at all  Social Connections: Moderately Isolated (07/13/2020)   Social Connection and Isolation Panel [NHANES]    Frequency of Communication with Friends and Family: More than three times a week    Frequency of Social Gatherings with Friends and Family: More than three times a week    Attends Religious Services: 1 to 4 times per year    Active Member of [employer] or Organizations: No    Attends Banker Meetings: Never    Marital Status: Widowed     Family History: The patient's family history includes Arthritis in his brother and father; Cancer in his brother and mother; Diabetes in his mother; Hypertension in his father; Kidney disease in his mother; Migraines in his father; Sleep disorder in his father and mother; Stroke in his mother. ROS:   Please see the history of present illness.    All 14 point review of systems negative except as described per  history of present illness  EKGs/Labs/Other Studies Reviewed:    EKG Interpretation Date/Time:  Friday November 30 2022 13:27:02 EDT Ventricular Rate:  69 PR Interval:    QRS Duration:  146 QT Interval:  470 QTC Calculation: 503 R Axis:   270  Text Interpretation: Atrial fibrillation with premature ventricular or aberrantly conducted complexes Right bundle branch block Inferior infarct , age undetermined Anteroseptal infarct , age undetermined Abnormal ECG When compared with ECG of 13-Mar-2020 04:38, PREVIOUS ECG IS PRESENT Confirmed by Gypsy Balsam [phone]) on 11/30/2022 1:37:40 PM    Recent Labs: 01/11/2022: NT-Pro BNP 4,599 05/24/2022: ALT 17; BUN 29; Creatinine, Ser 1.36; Hemoglobin 14.7; Platelets 231; Potassium 4.3; Sodium 144  Recent Lipid Panel    Component Value Date/Time   CHOL 192 05/24/2022 0903   TRIG 192 (H) 05/24/2022 0903   HDL 40 05/24/2022 0903   CHOLHDL 4.8 05/24/2022 0903   LDLCALC 118 (H) 05/24/2022 0903    Physical Exam:    VS:  BP (!) 150/86 (BP Location: Left Arm, Patient Position: Sitting)   Pulse 67   Ht 5\' 6"  (1.676 m)   Wt 165 lb (74.8 kg)   SpO2 94%   BMI 26.63 kg/m     Wt Readings from Last 3 Encounters:  11/30/22 165 lb (74.8 kg)  05/24/22 163 lb (73.9 kg)  04/30/22 169 lb 6.4 oz (76.8 kg)     GEN:  Well nourished, well developed in no acute distress HEENT: Normal NECK: No JVD; No carotid bruits LYMPHATICS: No lymphadenopathy CARDIAC: Irregularly irregular, holosystolic murmur grade 2/6 basilar border sternum, no rubs, no gallops RESPIRATORY:  Clear  to auscultation without rales, wheezing or rhonchi  ABDOMEN: Soft, non-tender, non-distended MUSCULOSKELETAL:  No edema; No deformity  SKIN: Warm and dry LOWER EXTREMITIES: no swelling NEUROLOGIC:  Alert and oriented x 3 PSYCHIATRIC:  Normal affect   ASSESSMENT:    1. Essential hypertension   2. Acute on chronic diastolic congestive heart failure (HCC)   3. Persistent atrial  fibrillation (HCC)   4. Nonrheumatic mitral valve regurgitation   5. Dyslipidemia   6. History of CVA (cerebrovascular accident)   7. Recurrent falls   8. Status post coronary artery bypass graft   9. S/P TAVR (transcatheter aortic valve replacement)    PLAN:    In order of problems listed above:  Status post TAVI, last echocardiogram reviewed normal valve function. Diastolic congestive heart failure.  I will check proBNP as well as Chem-7 today. Atrial fibrillation which is a new diagnosis.  I am extremely reluctant to consider anticoagulation with his unsteady gait bilateral foot drops and frequent falls.  I do not think he will be candidate for Watchman device.  He is a very difficult situation.  He is on aspirin right now which I will continue.  I will check his CBC Chem-7 as well as stool for guaiac with potential anticipation of anticoagulation which big reservation as described above. Nonrheumatic mitral valve regurgitation moderate continue monitoring.   Medication Adjustments/Labs and Tests Ordered: Current medicines are reviewed at length with the patient today.  Concerns regarding medicines are outlined above.  Orders Placed This Encounter  Procedures   EKG 12-Lead   Medication changes: No orders of the defined types were placed in this encounter.   Signed, Georgeanna Lea, MD, [facility] 11/30/2022 1:56 PM    [facility]

## 2022-11-30 NOTE — Patient Instructions (Addendum)
Medication Instructions:  Your physician recommends that you continue on your current medications as directed. Please refer to the Current Medication list given to you today.  *If you need a refill on your cardiac medications before your next appointment, please call your pharmacy*   Lab Work: ProBNP, BMP, CBC, CBC, Stool for blood, Iron, TIBC- today If you have labs (blood work) drawn today and your tests are completely normal, you will receive your results only by: MyChart Message (if you have MyChart) OR A paper copy in the mail If you have any lab test that is abnormal or we need to change your treatment, we will call you to review the results.   Testing/Procedures: None Ordered   Follow-Up: At [facility], you and your health needs are our priority.  As part of our continuing mission to provide you with exceptional heart care, we have created designated Provider Care Teams.  These Care Teams include your primary Cardiologist (physician) and Advanced Practice Providers (APPs -  Physician Assistants and Nurse Practitioners) who all work together to provide you with the care you need, when you need it.  We recommend signing up for the patient portal called "MyChart".  Sign up information is provided on this After Visit Summary.  MyChart is used to connect with patients for Virtual Visits (Telemedicine).  Patients are able to view lab/test results, encounter notes, upcoming appointments, etc.  Non-urgent messages can be sent to your provider as well.   To learn more about what you can do with MyChart, go to [email or web address].    Your next appointment:   3 month(s)  The format for your next appointment:   In Person  Provider:   Gypsy Balsam, MD    Other Instructions NA

## 2022-12-01 LAB — CBC
Hematocrit: 40 % (ref 37.5–51.0)
Hemoglobin: 12.4 g/dL — ABNORMAL LOW (ref 13.0–17.7)
MCH: 26.2 pg — ABNORMAL LOW (ref 26.6–33.0)
MCHC: 31 g/dL — ABNORMAL LOW (ref 31.5–35.7)
MCV: 85 fL (ref 79–97)
Platelets: 295 10*3/uL (ref 150–450)
RBC: 4.73 x10E6/uL (ref 4.14–5.80)
RDW: 16.5 % — ABNORMAL HIGH (ref 11.6–15.4)
WBC: 11.8 10*3/uL — ABNORMAL HIGH (ref 3.4–10.8)

## 2022-12-01 LAB — BASIC METABOLIC PANEL
BUN/Creatinine Ratio: 12 (ref 10–24)
BUN: 12 mg/dL (ref 8–27)
CO2: 22 mmol/L (ref 20–29)
Calcium: 9.4 mg/dL (ref 8.6–10.2)
Chloride: 105 mmol/L (ref 96–106)
Creatinine, Ser: 0.98 mg/dL (ref 0.76–1.27)
Glucose: 97 mg/dL (ref 70–99)
Potassium: 4 mmol/L (ref 3.5–5.2)
Sodium: 141 mmol/L (ref 134–144)
eGFR: 74 mL/min/{1.73_m2} (ref >59)

## 2022-12-01 LAB — IRON,TIBC AND FERRITIN PANEL
Ferritin: 168 ng/mL (ref 30–400)
Iron Saturation: 18 % (ref 15–55)
Iron: 51 ug/dL (ref 38–169)
Total Iron Binding Capacity: 288 ug/dL (ref 250–450)
UIBC: 237 ug/dL (ref 111–343)

## 2022-12-01 LAB — PRO B NATRIURETIC PEPTIDE: NT-Pro BNP: 3520 pg/mL — ABNORMAL HIGH (ref 0–486)

## 2022-12-05 ENCOUNTER — Telehealth: Payer: Self-pay

## 2022-12-05 DIAGNOSIS — I10 Essential (primary) hypertension: Secondary | ICD-10-CM

## 2022-12-05 NOTE — Telephone Encounter (Signed)
Results reviewed with pt as per Dr. Krasowski's note.  Pt verbalized understanding and had no additional questions. Routed to PCP  

## 2022-12-05 NOTE — Telephone Encounter (Signed)
Echo Results reviewed with pt as per Dr. Krasowski's note.  Pt verbalized understanding and had no additional questions. Routed to PCP 

## 2022-12-05 NOTE — Addendum Note (Signed)
Addended by: Baldo Ash D on: 12/05/2022 04:57 PM   Modules accepted: Orders

## 2022-12-06 ENCOUNTER — Telehealth: Payer: Self-pay

## 2022-12-06 DIAGNOSIS — I4819 Other persistent atrial fibrillation: Secondary | ICD-10-CM | POA: Diagnosis not present

## 2022-12-06 MED ORDER — POTASSIUM CHLORIDE ER 10 MEQ PO TBCR: 10.0000 meq | EXTENDED_RELEASE_TABLET | Freq: Every day | ORAL | 3 refills | Status: DC

## 2022-12-06 MED ORDER — FUROSEMIDE 80 MG PO TABS: 80.0000 mg | ORAL_TABLET | Freq: Every day | ORAL | 3 refills | Status: DC

## 2022-12-06 NOTE — Telephone Encounter (Signed)
Reviewed medications with pt. Combined duplicates and took out discontinued meds. Sent in new dose of Lasix 80mg  q d and Potassium per Dr. Vanetta Shawl note.

## 2022-12-07 LAB — FECAL OCCULT BLOOD, IMMUNOCHEMICAL: Fecal Occult Bld: NEGATIVE

## 2022-12-11 ENCOUNTER — Telehealth: Payer: Self-pay

## 2022-12-11 NOTE — Telephone Encounter (Signed)
Jack Hodges from [facility] called and wanted to know if it was okay for them to send orders to Dr. Sedalia Muta for PT. They have orders from patient cardiologist to start care but may need orders from Dr. Sedalia Muta to continue care, and will fax over paperwork to be signed.

## 2022-12-17 DIAGNOSIS — I4819 Other persistent atrial fibrillation: Secondary | ICD-10-CM | POA: Diagnosis not present

## 2022-12-17 DIAGNOSIS — I350 Nonrheumatic aortic (valve) stenosis: Secondary | ICD-10-CM | POA: Diagnosis not present

## 2022-12-17 DIAGNOSIS — Z8673 Personal history of transient ischemic attack (TIA), and cerebral infarction without residual deficits: Secondary | ICD-10-CM | POA: Diagnosis not present

## 2022-12-17 DIAGNOSIS — I5033 Acute on chronic diastolic (congestive) heart failure: Secondary | ICD-10-CM | POA: Diagnosis not present

## 2022-12-17 DIAGNOSIS — I340 Nonrheumatic mitral (valve) insufficiency: Secondary | ICD-10-CM | POA: Diagnosis not present

## 2022-12-17 DIAGNOSIS — G629 Polyneuropathy, unspecified: Secondary | ICD-10-CM | POA: Diagnosis not present

## 2022-12-17 DIAGNOSIS — I110 Hypertensive heart disease with heart failure: Secondary | ICD-10-CM | POA: Diagnosis not present

## 2022-12-17 DIAGNOSIS — I2510 Atherosclerotic heart disease of native coronary artery without angina pectoris: Secondary | ICD-10-CM | POA: Diagnosis not present

## 2022-12-17 DIAGNOSIS — I252 Old myocardial infarction: Secondary | ICD-10-CM | POA: Diagnosis not present

## 2022-12-17 DIAGNOSIS — Z955 Presence of coronary angioplasty implant and graft: Secondary | ICD-10-CM | POA: Diagnosis not present

## 2022-12-25 NOTE — Progress Notes (Signed)
Subjective:  Patient ID: Jack Hodges, male    DOB: [birth year]  Age: 89 y.o. MRN: [id number]  Chief Complaint  Patient presents with   Medical Management of Chronic Issues    HPI  Complaining of dyspnea on exertion. No chest pain. Patient sees Dr. Bing Matter and he said it is not his heart.  Coughs a lot since had covid 19 two - three years ago.   Atrial fibrillation: On carvedilol 25 mg twice daily,    Hyperlipidemia: Current medications: off rosuvastin 20 mg daily and zetia  x 2 weeks. Stopped because legs were hurting. Has improved not resolved.   Hypertension: Complications: CONGESTIVE HEART FAILURE. Recent echo showed mild decreased EF 50-55% Current medications: Amlodipine amlodipine 5 mg daily, aspirin 81 mg daily, carvedilol 25 mg twice daily, Lasix 80 mg once daily, olmesartan 40 mg once daily, spironolactone 25 mg once daily, and potassium 10 mEq once daily.       12/26/2022   11:30 AM 01/24/2022    8:26 PM 10/11/2021    8:43 AM 08/01/2021    2:17 PM 05/16/2021   10:32 AM  Depression screen PHQ 2/9  Decreased Interest 0 2 2 0 0  Down, Depressed, Hopeless 0 2 2 0 0  PHQ - 2 Score 0 4 4 0 0  Altered sleeping  2 3  1   Tired, decreased energy  1 2  0  Change in appetite  2 2  0  Feeling bad or failure about yourself   1 3  1   Trouble concentrating  0 0  0  Moving slowly or fidgety/restless  0 0  0  Suicidal thoughts  0 0  0  PHQ-9 Score  10 14  2   Difficult doing work/chores  Not difficult at all Somewhat difficult          12/26/2022   11:30 AM  Fall Risk   Falls in the past year? 0  Number falls in past yr: 0  Injury with Fall? 0  Risk for fall due to : Impaired balance/gait;No Fall Risks  Follow up Falls evaluation completed;Falls prevention discussed    Patient Care Team: Blane Ohara, MD as PCP - General (Family Medicine) Georgeanna Lea, MD as PCP - Cardiology (Cardiology) Zettie Pho, [facility] (Inactive) as Pharmacist (Pharmacist)   Review  of Systems  Constitutional:  Negative for chills and fever.  HENT:  Positive for congestion. Negative for ear pain and sore throat.   Respiratory:  Positive for cough and shortness of breath.   Cardiovascular:  Negative for chest pain.  Gastrointestinal:  Positive for constipation (Takes otc softener prn.). Negative for abdominal pain, diarrhea, nausea and vomiting.  Genitourinary:  Negative for difficulty urinating and dysuria.  Musculoskeletal:  Positive for arthralgias (right hip pain. Left shoulder pain) and myalgias (improving.).  Neurological:  Negative for dizziness and headaches.  Psychiatric/Behavioral:  Positive for dysphoric mood. The patient is not nervous/anxious.     Current Outpatient Medications on File Prior to Visit  Medication Sig Dispense Refill   acetaminophen (TYLENOL) 650 MG CR tablet Take 650 mg by mouth every 8 (eight) hours as needed for pain.      albuterol (VENTOLIN HFA) 108 (90 Base) MCG/ACT inhaler Inhale 2 puffs into the lungs every 2 (two) hours as needed for wheezing or shortness of breath. 6.7 g 6   amLODipine (NORVASC) 5 MG tablet Take 5 mg by mouth daily.     ammonium lactate (LAC-HYDRIN) 12 % lotion  Apply 1 Application topically 2 (two) times daily. Apply twice daily to arms and hands to increase moisture and reduce easy bruising.  Can also be used safely on neck, chest, and legs as needed 400 g 0   ascorbic acid (VITAMIN C) 500 MG tablet Take 1 tablet (500 mg total) by mouth daily. 30 tablet 0   aspirin EC 81 MG tablet Take 1 tablet (81 mg total) by mouth daily. Swallow whole. 90 tablet 1   carbidopa-levodopa (SINEMET) 25-100 MG tablet Take 1 tablet by mouth at bedtime as needed (rESTLESS LEGS.). 90 tablet 1   carvedilol (COREG) 25 MG tablet Take 1 tablet (25 mg total) by mouth 2 (two) times daily. 60 tablet 3   cholecalciferol (VITAMIN D3) 25 MCG (1000 UNIT) tablet Take 1 tablet (1,000 Units total) by mouth daily. 90 tablet 1   fluorometholone (FML) 0.1 %  ophthalmic suspension Place 1 drop into both eyes daily.     furosemide (LASIX) 80 MG tablet Take 1 tablet (80 mg total) by mouth daily. 90 tablet 3   nitroGLYCERIN (NITROSTAT) 0.4 MG SL tablet Place 1 tablet (0.4 mg total) under the tongue every 5 (five) minutes as needed for chest pain. 25 tablet 2   olmesartan (BENICAR) 40 MG tablet Take 1 tablet (40 mg total) by mouth daily. 90 tablet 1   pantoprazole (PROTONIX) 40 MG tablet Take 1 tablet (40 mg total) by mouth daily. 100 tablet 1   potassium chloride (KLOR-CON) 10 MEQ tablet Take 1 tablet (10 mEq total) by mouth daily. 90 tablet 3   sertraline (ZOLOFT) 50 MG tablet Take 1 tablet (50 mg total) by mouth daily. 90 tablet 1   spironolactone (ALDACTONE) 25 MG tablet Take 1 tablet (25 mg total) by mouth once for 1 dose. 30 tablet 3   vitamin B-12 (CYANOCOBALAMIN) 500 MCG tablet Take 500 mcg by mouth daily.     zinc sulfate 220 (50 Zn) MG capsule Take 1 capsule (220 mg total) by mouth daily. 90 capsule 1   ezetimibe (ZETIA) 10 MG tablet Take 1 tablet (10 mg total) by mouth daily. (Patient not taking: Reported on 12/26/2022) 90 tablet 1   rosuvastatin (CRESTOR) 20 MG tablet Take 1 tablet (20 mg total) by mouth at bedtime. (Patient not taking: Reported on 12/26/2022) 90 tablet 3   [DISCONTINUED] QUEtiapine (SEROQUEL) 25 MG tablet Take 1 tablet (25 mg total) by mouth at bedtime. 15 tablet 10   Current Facility-Administered Medications on File Prior to Visit  Medication Dose Route Frequency Provider Last Rate Last Admin   triamcinolone acetonide (KENALOG-40) injection 60 mg  60 mg Intramuscular Once Abigail Miyamoto, MD       Past Medical History:  Diagnosis Date   Acute bursitis of left shoulder 01/04/2020   Acute on chronic diastolic congestive heart failure (HCC) 11/13/2021   Last Assessment & Plan:  Formatting of this note is different from the original. Pertinent Data:   Current medication includes: furosemide - 20 mg lisinopriL - 10 mg  metoprolol succinate - 25 mg.  BNP (pg/mL)  Date Value  11/15/2021 433.6 (H)   eGFR (mL/min/1.45m*2)  Date Value  11/15/2021 >60.0    BP Readings from Last 3 Encounters:  11/30/21 (!) 147/82  11/15/21 (!) 109/54  11/13/21 (!) 134/74     Acute respiratory failure with hypoxia (HCC) 03/17/2020   Angina pectoris (HCC) 10/07/2019   Arthritis    Atherosclerosis of aorta (HCC) 09/24/2019   Bilateral foot-drop 01/18/2017  BMI 27.0-27.9,adult 09/24/2019   Coronary artery disease involving native coronary artery of native heart without angina pectoris 11/01/2015   Overview:  Bypass surgery in 1996 Overview:  Overview:  Bypass surgery in 1996  Last Assessment & Plan:  Continue his current regimen.  Follow up as noted.   CVA (cerebral vascular accident) (HCC) 04/03/2016   Last Assessment & Plan:  The patient underwent extensive CVA workup.  MRI is negative.  He does have aortic stenosis by echocardiogram.  Upon review of Care everywhere he is followed by Dr. Bing Matter, cardiologist for this.  Carotid ultrasound negative for hemodynamically significant stenosis.  Lipid panel within normal limits.  He has not had any further episodes of dizziness or nausea vomiting.    Demand ischemia (HCC) 11/14/2021   Last Assessment & Plan:  Formatting of this note might be different from the original. Troponin flat No anginal symptoms Formatting of this note might be different from the original. Last Assessment & Plan:  Formatting of this note might be different from the original. Troponin flat No anginal symptoms   Dementia (HCC)    Depression    DNR (do not resuscitate) 04/03/2016   Overview:  I had an extensive discussion with the patient on 04/03/2016 regarding his end of life wishes.  He and his daughter agree that do not resuscitate is in keeping with his beliefs.   Dyslipidemia 11/01/2015   Last Assessment & Plan:  Formatting of this note might be different from the original. Cholesterol 136   Triglycerides  83  HDL   50.1  LDL Calculated  69  VLDL Cholesterol Cal  17   Continue statin.   Dyspnea    with activity   Essential hypertension 11/01/2015   Last Assessment & Plan:  Resume home medications.  Follow-up as noted above.   Generalized weakness 03/13/2020   GERD (gastroesophageal reflux disease)    Herpes zoster 05/19/2019   History of aortic stenosis 03/11/2020   History of CVA (cerebrovascular accident) 11/14/2021   Last Assessment & Plan:  Formatting of this note might be different from the original. Neurologically intact upon assessment -continue aspirin, statin   Hypertensive heart disease with heart failure (HCC) 11/01/2015   Last Assessment & Plan:  Resume home medications.  Follow-up as noted above.   Myocardial infarction (HCC)    approx 1994   Neuropathy    Peripheral neuropathy 11/14/2021   Last Assessment & Plan:  Formatting of this note might be different from the original. Bilateral lower extremity cool to touch with diminished sensation Patient reports findings are chronic Continue home dose Lyrica Formatting of this note might be different from the original. Last Assessment & Plan:  Formatting of this note might be different from the original. 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   Thrombocytopenia (HCC) 03/13/2020   Trochanteric bursitis of left hip 01/19/2021   Past Surgical History:  Procedure Laterality Date   CARDIAC CATHETERIZATION     CARDIAC SURGERY     CATARACT EXTRACTION, BILATERAL     CORONARY  ARTERY BYPASS GRAFT      CORONARY STENT INTERVENTION N/A 10/07/2019   Procedure: CORONARY STENT INTERVENTION;  Surgeon: Tonny Bollman, MD;  Location: [facility] INVASIVE CV LAB;  Service: Cardiovascular;  Laterality: N/A;   EYE SURGERY     HEMORROIDECTOMY     RIGHT/LEFT HEART CATH AND CORONARY/GRAFT ANGIOGRAPHY N/A 10/07/2019   Procedure: RIGHT/LEFT HEART CATH AND CORONARY/GRAFT ANGIOGRAPHY;  Surgeon: Tonny Bollman, MD;  Location: [facility] INVAS[facility] CV LAB;  Service: Cardiovascular;  Laterality: N/A;   TEE WITHOUT CARDIOVERSION N/A 10/27/2019   Procedure: TRANSESOPHAGEAL ECHOCARDIOGRAM (TEE);  Surgeon: Kathleene Hazel, MD;  Location: [facility] INVASIVE CV LAB;  Service: Open Heart Surgery;  Laterality: N/A;   TRANSCATHETER AORTIC VALVE REPLACEMENT, TRANSFEMORAL N/A 10/27/2019   Procedure: TRANSCATHETER AORTIC VALVE REPLACEMENT, TRANSFEMORAL;  Surgeon: Kathleene Hazel, MD;  Location: [facility] INVASIVE CV LAB;  Service: Open Heart Surgery;  Laterality: N/A;    Family History  Problem Relation Age of Onset   Cancer Mother    Diabetes Mother    Kidney disease Mother    Stroke Mother    Sleep disorder Mother    Arthritis Father    Migraines Father    Hypertension Father    Sleep disorder Father    Arthritis Brother    Cancer Brother    Social History   Socioeconomic History   Marital status: Widowed    Spouse name: Dorann Lodge   Number of children: 2   Years of education: Not on file   Highest education level: Not on file  Occupational History   Occupation: Retired    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  He has one son that lives with him and one daughter that lives beside of him. He is close with his family.  His sister cooks meals for him regularly.  He has friends at the cafe he visits regularly.   Social Determinants of Health   Financial Resource Strain: Low Risk  (11/13/2021)   Received from [place] [facility] [place], [facility] [employer]   Overall [employer] (CARDIA)    Difficulty of Paying Living Expenses: Not hard at all  Food Insecurity: No Food Insecurity (11/13/2021)   Received from [place] [facility] [place], [facility] [employer] Vital Sign    Worried About Programme researcher, broadcasting/film/video in the Last Year: Never true    Ran Out of Food in the Last Year: Never true  Transportation Needs: No Transportation Needs (11/13/2021)   Received from [place] [facility] [place], [facility] [employer] (Medical): No    Lack of Transportation (Non-Medical): No  Physical Activity: Insufficiently Active (07/13/2020)   Exercise Vital Sign    Days of Exercise per Week: 2 days    Minutes of Exercise per Session: 10 min  Stress: No Stress Concern Present (07/13/2020)   [employer] of Occupational Health - Occupational  Stress Questionnaire    Feeling of Stress : Not at all  Social Connections: Moderately Isolated (07/13/2020)   Social Connection and Isolation Panel [NHANES]    Frequency of Communication with Friends and Family: More than three times a week    Frequency of Social Gatherings with Friends and Family: More than three times a week    Attends Religious Services: 1 to 4 times per year    Active Member of [employer] or Organizations: No    Attends Banker Meetings: Never    Marital Status: Widowed    Objective:  BP 110/70   Pulse 72   Temp (!) 97.1 F (36.2 C)   Resp 16   Ht 5\' 6"  (1.676 m)   Wt 162 lb (73.5 kg)   BMI 26.15 kg/m       12/26/2022   11:26 AM 11/30/2022    1:28 PM 05/24/2022    8:37 AM  BP/Weight  Systolic BP 110 150 110  Diastolic BP 70 86 60  Wt. (Lbs) 162 165 163  BMI 26.15 kg/m2 26.63 kg/m2 26.31 kg/m2    Physical Exam Vitals reviewed.  Constitutional:      Appearance: Normal appearance.  Neck:     Vascular: No carotid bruit.  Cardiovascular:     Rate and Rhythm: Normal rate and regular rhythm.     Heart sounds: Normal heart sounds.  Pulmonary:     Effort: Pulmonary effort is normal.     Breath sounds: Normal breath sounds. No wheezing, rhonchi or rales.  Abdominal:     General: Bowel sounds are normal.     Palpations: Abdomen is soft.     Tenderness: There is no abdominal tenderness.  Neurological:     Mental Status: He is alert.  Psychiatric:        Mood and Affect: Mood normal.        Behavior: Behavior normal.     Diabetic Foot Exam - Simple   No data filed      Lab Results  Component Value Date   WBC 13.2 (H) 12/26/2022   HGB 12.1 (L) 12/26/2022   HCT 37.8 12/26/2022   PLT 303 12/26/2022   GLUCOSE 121 (H) 12/26/2022   CHOL 140 12/26/2022   TRIG 175 (H) 12/26/2022   HDL 28 (L) 12/26/2022   LDLCALC 82 12/26/2022   ALT 9 12/26/2022   AST 20 12/26/2022   NA 143 12/26/2022   K 4.6 12/26/2022   CL 104 12/26/2022   CREATININE 1.16 12/26/2022   BUN 16 12/26/2022   CO2 21 12/26/2022   INR 1.1 10/23/2019   HGBA1C 4.8 10/23/2019      Assessment & Plan:    Hypertensive heart disease with heart failure [facility]) Assessment & Plan: Management per specialist Continue with current medications: Amlodipine amlodipine 5 mg daily, aspirin 81 mg daily, carvedilol 25 mg twice daily, Lasix 80 mg once daily, olmesartan 40 mg once daily, spironolactone 25 mg once daily, and potassium 10 mEq once daily.   Orders: -     CBC with Differential/Platelet -     Comprehensive metabolic panel -     Lipid panel  Gastroesophageal reflux disease without esophagitis Assessment & Plan: The  current medical regimen is effective;  continue present plan and medications.    Dyspnea on exertion Assessment & Plan: Order Chest Xray  Orders: -     DG Chest 2 View; Future  Encounter for immunization -     Flu Vaccine Trivalent  High Dose (Fluad)  Dyslipidemia Assessment & Plan: Await labs/testing for assessment and recommendations. Recommend continue to work on eating healthy diet and exercise.       No orders of the defined types were placed in this encounter.   Orders Placed This Encounter  Procedures   DG Chest 2 View   Flu Vaccine Trivalent High Dose (Fluad)   CBC with Differential/Platelet   Comprehensive metabolic panel   Lipid panel     Follow-up: Return in about 4 months (around 04/28/2023) for chronic follow up.   I,Katherina A Bramblett,acting as a scribe for Blane Ohara, MD.,have documented all relevant documentation on the behalf of Blane Ohara, MD,as directed by  Blane Ohara, MD while in the presence of Blane Ohara, MD.   Clayborn Bigness I Leal-Borjas,acting as a scribe for Blane Ohara, MD.,have documented all relevant documentation on the behalf of Blane Ohara, MD,as directed by  Blane Ohara, MD while in the presence of Blane Ohara, MD.    An After Visit Summary was printed and given to the patient.  I attest that I have reviewed this visit and agree with the plan scribed by my staff.   Blane Ohara, MD [facility] [phone]

## 2022-12-26 ENCOUNTER — Ambulatory Visit (INDEPENDENT_AMBULATORY_CARE_PROVIDER_SITE_OTHER): Payer: HMO | Admitting: Family Medicine

## 2022-12-26 VITALS — BP 110/70 | HR 72 | Temp 97.1°F | Resp 16 | Ht 66.0 in | Wt 162.0 lb

## 2022-12-26 DIAGNOSIS — I110 Hypertensive heart disease with heart failure: Secondary | ICD-10-CM

## 2022-12-26 DIAGNOSIS — R069 Unspecified abnormalities of breathing: Secondary | ICD-10-CM | POA: Diagnosis not present

## 2022-12-26 DIAGNOSIS — I4891 Unspecified atrial fibrillation: Secondary | ICD-10-CM

## 2022-12-26 DIAGNOSIS — I517 Cardiomegaly: Secondary | ICD-10-CM | POA: Diagnosis not present

## 2022-12-26 DIAGNOSIS — K219 Gastro-esophageal reflux disease without esophagitis: Secondary | ICD-10-CM

## 2022-12-26 DIAGNOSIS — R0609 Other forms of dyspnea: Secondary | ICD-10-CM

## 2022-12-26 DIAGNOSIS — Z23 Encounter for immunization: Secondary | ICD-10-CM | POA: Diagnosis not present

## 2022-12-26 DIAGNOSIS — E785 Hyperlipidemia, unspecified: Secondary | ICD-10-CM

## 2022-12-26 DIAGNOSIS — Z951 Presence of aortocoronary bypass graft: Secondary | ICD-10-CM | POA: Diagnosis not present

## 2022-12-26 DIAGNOSIS — R0602 Shortness of breath: Secondary | ICD-10-CM | POA: Diagnosis not present

## 2022-12-27 LAB — CBC WITH DIFFERENTIAL/PLATELET
Basophils Absolute: 0.2 10*3/uL (ref 0.0–0.2)
Basos: 1 %
EOS (ABSOLUTE): 0.6 10*3/uL — ABNORMAL HIGH (ref 0.0–0.4)
Eos: 5 %
Hematocrit: 37.8 % (ref 37.5–51.0)
Hemoglobin: 12.1 g/dL — ABNORMAL LOW (ref 13.0–17.7)
Immature Grans (Abs): 0.1 10*3/uL (ref 0.0–0.1)
Immature Granulocytes: 1 %
Lymphocytes Absolute: 1.1 10*3/uL (ref 0.7–3.1)
Lymphs: 9 %
MCH: 28.3 pg (ref 26.6–33.0)
MCHC: 32 g/dL (ref 31.5–35.7)
MCV: 89 fL (ref 79–97)
Monocytes Absolute: 0.5 10*3/uL (ref 0.1–0.9)
Monocytes: 4 %
Neutrophils Absolute: 10.6 10*3/uL — ABNORMAL HIGH (ref 1.4–7.0)
Neutrophils: 80 %
Platelets: 303 10*3/uL (ref 150–450)
RBC: 4.27 x10E6/uL (ref 4.14–5.80)
RDW: 17 % — ABNORMAL HIGH (ref 11.6–15.4)
WBC: 13.2 10*3/uL — ABNORMAL HIGH (ref 3.4–10.8)

## 2022-12-27 LAB — COMPREHENSIVE METABOLIC PANEL
ALT: 9 [IU]/L (ref 0–44)
AST: 20 [IU]/L (ref 0–40)
Albumin: 4.3 g/dL (ref 3.7–4.7)
Alkaline Phosphatase: 106 [IU]/L (ref 44–121)
BUN/Creatinine Ratio: 14 (ref 10–24)
BUN: 16 mg/dL (ref 8–27)
Bilirubin Total: 0.8 mg/dL (ref 0.0–1.2)
CO2: 21 mmol/L (ref 20–29)
Calcium: 9.3 mg/dL (ref 8.6–10.2)
Chloride: 104 mmol/L (ref 96–106)
Creatinine, Ser: 1.16 mg/dL (ref 0.76–1.27)
Globulin, Total: 2 g/dL (ref 1.5–4.5)
Glucose: 121 mg/dL — ABNORMAL HIGH (ref 70–99)
Potassium: 4.6 mmol/L (ref 3.5–5.2)
Sodium: 143 mmol/L (ref 134–144)
Total Protein: 6.3 g/dL (ref 6.0–8.5)
eGFR: 60 mL/min/{1.73_m2} (ref >59)

## 2022-12-27 LAB — LIPID PANEL
Chol/HDL Ratio: 5 {ratio} (ref 0.0–5.0)
Cholesterol, Total: 140 mg/dL (ref 100–199)
HDL: 28 mg/dL — ABNORMAL LOW (ref >39)
LDL Chol Calc (NIH): 82 mg/dL (ref 0–99)
Triglycerides: 175 mg/dL — ABNORMAL HIGH (ref 0–149)
VLDL Cholesterol Cal: 30 mg/dL (ref 5–40)

## 2022-12-29 NOTE — Assessment & Plan Note (Signed)
Management per specialist Continue with current medications: Amlodipine amlodipine 5 mg daily, aspirin 81 mg daily, carvedilol 25 mg twice daily, Lasix 80 mg once daily, olmesartan 40 mg once daily, spironolactone 25 mg once daily, and potassium 10 mEq once daily.

## 2022-12-29 NOTE — Assessment & Plan Note (Signed)
Order Chest X-ray.

## 2022-12-29 NOTE — Assessment & Plan Note (Signed)
The current medical regimen is effective;  continue present plan and medications.  

## 2022-12-31 ENCOUNTER — Other Ambulatory Visit: Payer: Self-pay

## 2022-12-31 MED ORDER — AMOXICILLIN 875 MG PO TABS: 875.0000 mg | ORAL_TABLET | Freq: Two times a day (BID) | ORAL | 0 refills | Status: AC

## 2023-01-04 ENCOUNTER — Telehealth: Payer: Self-pay

## 2023-01-04 NOTE — Telephone Encounter (Signed)
[facility] Home Health services approved by insurance. Letter processed to scan.  JYN#[id number] Home Health- PT  from 12/17/2022-02/14/2023 Home Health -OT  from 12/17/2022- 02/14/2023

## 2023-01-06 ENCOUNTER — Encounter: Payer: Self-pay | Admitting: Family Medicine

## 2023-01-06 DIAGNOSIS — Z23 Encounter for immunization: Secondary | ICD-10-CM | POA: Insufficient documentation

## 2023-01-06 NOTE — Assessment & Plan Note (Signed)
Await labs/testing for assessment and recommendations. Recommend continue to work on eating healthy diet and exercise.

## 2023-01-09 DIAGNOSIS — E538 Deficiency of other specified B group vitamins: Secondary | ICD-10-CM | POA: Diagnosis not present

## 2023-01-09 DIAGNOSIS — I509 Heart failure, unspecified: Secondary | ICD-10-CM | POA: Diagnosis not present

## 2023-01-09 DIAGNOSIS — F331 Major depressive disorder, recurrent, moderate: Secondary | ICD-10-CM | POA: Diagnosis not present

## 2023-01-09 DIAGNOSIS — G63 Polyneuropathy in diseases classified elsewhere: Secondary | ICD-10-CM | POA: Diagnosis not present

## 2023-01-09 DIAGNOSIS — D6869 Other thrombophilia: Secondary | ICD-10-CM | POA: Diagnosis not present

## 2023-01-09 DIAGNOSIS — J449 Chronic obstructive pulmonary disease, unspecified: Secondary | ICD-10-CM | POA: Diagnosis not present

## 2023-01-09 DIAGNOSIS — E876 Hypokalemia: Secondary | ICD-10-CM | POA: Diagnosis not present

## 2023-01-09 DIAGNOSIS — I700 Atherosclerosis of aorta: Secondary | ICD-10-CM | POA: Diagnosis not present

## 2023-01-09 DIAGNOSIS — E261 Secondary hyperaldosteronism: Secondary | ICD-10-CM | POA: Diagnosis not present

## 2023-01-09 DIAGNOSIS — I110 Hypertensive heart disease with heart failure: Secondary | ICD-10-CM | POA: Diagnosis not present

## 2023-01-09 DIAGNOSIS — I4891 Unspecified atrial fibrillation: Secondary | ICD-10-CM | POA: Diagnosis not present

## 2023-01-09 DIAGNOSIS — E785 Hyperlipidemia, unspecified: Secondary | ICD-10-CM | POA: Diagnosis not present

## 2023-01-14 ENCOUNTER — Encounter: Payer: Self-pay | Admitting: Family Medicine

## 2023-01-21 ENCOUNTER — Other Ambulatory Visit: Payer: Self-pay | Admitting: Family Medicine

## 2023-01-21 ENCOUNTER — Other Ambulatory Visit: Payer: Self-pay | Admitting: Cardiology

## 2023-01-28 ENCOUNTER — Other Ambulatory Visit: Payer: Self-pay

## 2023-01-28 ENCOUNTER — Telehealth: Payer: Self-pay | Admitting: Family Medicine

## 2023-01-28 MED ORDER — SERTRALINE HCL 50 MG PO TABS: 50.0000 mg | ORAL_TABLET | Freq: Every day | ORAL | 1 refills | Status: DC

## 2023-01-28 MED ORDER — CARBIDOPA-LEVODOPA 25-100 MG PO TABS: 1.0000 | ORAL_TABLET | Freq: Every evening | ORAL | 1 refills | Status: DC | PRN

## 2023-01-28 NOTE — Telephone Encounter (Signed)
Prescription Request  01/28/2023  LOV: 12/26/2022  What is the name of the medication or equipment? carbidopa-levodopa (SINEMET) 25-100 MG tablet [[id number]]  sertraline (ZOLOFT) 50 MG tablet [[id number]]  amLODipine (NORVASC) 5 MG tablet [[id number]]          Which pharmacy would you like this sent to?   [facility] [place] - [place], [place] - [address] [place] [address] Phone: [phone] Fax: [phone]      Patient notified that their request is being sent to the clinical staff for review and that they should receive a response within 2 business days.   Please advise at Mobile [phone] (mobile)

## 2023-02-13 ENCOUNTER — Telehealth: Payer: Self-pay

## 2023-02-13 NOTE — Telephone Encounter (Signed)
Attempted to reach patient regarding weightloss reported by therapist.  If he calls back please get current weight and schedule appointment with provider.  Therapist reported weight loss of approximately 20 lbs due to no appetite.

## 2023-02-18 ENCOUNTER — Other Ambulatory Visit: Payer: Self-pay | Admitting: Family Medicine

## 2023-02-18 DIAGNOSIS — K2100 Gastro-esophageal reflux disease with esophagitis, without bleeding: Secondary | ICD-10-CM

## 2023-02-18 DIAGNOSIS — H1013 Acute atopic conjunctivitis, bilateral: Secondary | ICD-10-CM | POA: Diagnosis not present

## 2023-02-18 DIAGNOSIS — H2703 Aphakia, bilateral: Secondary | ICD-10-CM | POA: Diagnosis not present

## 2023-02-18 MED ORDER — PANTOPRAZOLE SODIUM 40 MG PO TBEC
40.0000 mg | DELAYED_RELEASE_TABLET | Freq: Every day | ORAL | 1 refills | Status: DC
Start: 2023-02-18 — End: 2023-10-03

## 2023-02-18 NOTE — Telephone Encounter (Signed)
Copied from CRM [id number]. Topic: Clinical - Medication Refill >> Feb 18, 2023  9:01 AM Gaetano Hawthorne wrote: Most Recent Primary Care Visit:  Provider: [facility]  Department[facility]  Visit Type: OFFICE VISIT  Date: 12/26/2022  Medication: ***  Has the patient contacted their pharmacy?  (Agent: If no, request that the patient contact the pharmacy for the refill. If patient does not wish to contact the pharmacy document the reason why and proceed with request.) (Agent: If yes, when and what did the pharmacy advise?)  Is this the correct pharmacy for this prescription?  If no, delete pharmacy and type the correct one.  This is the patient's preferred pharmacy:  [facility] - [address] - [place], [place] - [address] [place] [address] Phone: [phone] Fax: [phone]  [facility]) - [place], [place] - [address] [facility] [address] [place] [address] Phone: [phone] Fax: [phone]  [facility] [address] [place] [address] Phone: [phone] Fax: [phone]  [facility] - [place], [place] - [address] [place]  [address] Phone: [phone] Fax: [phone]  [facility] - [place], [place] - [address] [place] [address] Phone: [phone] Fax: [phone]   Has the prescription been filled recently?   Is the patient out of the medication?   Has the patient been seen for an appointment in the last year OR does the patient have an upcoming appointment?   Can we respond through MyChart?   Agent: Please be advised that Rx refills may take up to 3 business days. We ask that you follow-up with your pharmacy.

## 2023-02-28 ENCOUNTER — Other Ambulatory Visit: Payer: Self-pay

## 2023-02-28 MED ORDER — CARVEDILOL 25 MG PO TABS: 25.0000 mg | ORAL_TABLET | Freq: Two times a day (BID) | ORAL | 3 refills | Status: DC

## 2023-03-07 ENCOUNTER — Ambulatory Visit: Payer: HMO

## 2023-03-07 VITALS — Ht 66.0 in | Wt 145.0 lb

## 2023-03-07 DIAGNOSIS — Z0000 Encounter for general adult medical examination without abnormal findings: Secondary | ICD-10-CM | POA: Diagnosis not present

## 2023-03-07 NOTE — Progress Notes (Signed)
Subjective:   Jack Hodges is a 89 y.o. male who presents for Medicare Annual/Subsequent preventive examination.  Visit Complete: Virtual I connected with  Jack Hodges on 03/07/23 by a audio enabled telemedicine application and verified that I am speaking with the correct person using two identifiers.  Patient Location: Home  Provider Location: Office/Clinic  I discussed the limitations of evaluation and management by telemedicine. The patient expressed understanding and agreed to proceed.  Vital Signs: Because this visit was a virtual/telehealth visit, some criteria may be missing or patient reported. Any vitals not documented were not able to be obtained and vitals that have been documented are patient reported.  Cardiac Risk Factors include: advanced age (>62men, >16 women);dyslipidemia;family history of premature cardiovascular disease;hypertension;male gender;sedentary lifestyle     Objective:    Today's Vitals   03/07/23 1302  Weight: 145 lb (65.8 kg)  Height: 5\' 6"  (1.676 m)  PainSc: 8   PainLoc: Shoulder   Body mass index is 23.4 kg/m.     03/07/2023    1:05 PM 07/13/2020    9:37 AM 03/14/2020    4:00 PM 03/14/2020    3:34 PM 10/28/2019    9:00 AM 10/27/2019    6:33 AM 10/23/2019    9:29 AM  Advanced Directives  Does Patient Have a Medical Advance Directive? Yes Yes  Yes Yes  Yes  Type of Estate agent of ;Living will Healthcare Power of [place];Living will Living will  Living will  Living will  Does patient want to make changes to medical advance directive?  No - Patient declined No - Patient declined  No - Patient declined No - Patient declined No - Patient declined  Copy of Healthcare Power of Attorney in Chart? No - copy requested No - copy requested         Current Medications (verified) Outpatient Encounter Medications as of 03/07/2023  Medication Sig   acetaminophen (TYLENOL) 650 MG CR tablet Take 650 mg by mouth every 8  (eight) hours as needed for pain.    albuterol (VENTOLIN HFA) 108 (90 Base) MCG/ACT inhaler Inhale 2 puffs into the lungs every 2 (two) hours as needed for wheezing or shortness of breath.   amLODipine (NORVASC) 5 MG tablet Take 5 mg by mouth daily.   ammonium lactate (LAC-HYDRIN) 12 % lotion Apply 1 Application topically 2 (two) times daily. Apply twice daily to arms and hands to increase moisture and reduce easy bruising.  Can also be used safely on neck, chest, and legs as needed   ascorbic acid (VITAMIN C) 500 MG tablet Take 1 tablet (500 mg total) by mouth daily.   aspirin EC 81 MG tablet Take 1 tablet (81 mg total) by mouth daily. Swallow whole.   carbidopa-levodopa (SINEMET) 25-100 MG tablet Take 1 tablet by mouth at bedtime as needed (rESTLESS LEGS.).   carvedilol (COREG) 25 MG tablet Take 1 tablet (25 mg total) by mouth 2 (two) times daily.   cholecalciferol (VITAMIN D3) 25 MCG (1000 UNIT) tablet Take 1 tablet (1,000 Units total) by mouth daily.   ezetimibe (ZETIA) 10 MG tablet Take 1 tablet (10 mg total) by mouth daily. (Patient not taking: Reported on 12/26/2022)   fluorometholone (FML) 0.1 % ophthalmic suspension Place 1 drop into both eyes daily.   furosemide (LASIX) 80 MG tablet Take 1 tablet (80 mg total) by mouth daily.   nitroGLYCERIN (NITROSTAT) 0.4 MG SL tablet Place 1 tablet (0.4 mg total) under the tongue every  5 (five) minutes as needed for chest pain.   olmesartan (BENICAR) 40 MG tablet Take 1 tablet (40 mg total) by mouth daily.   pantoprazole (PROTONIX) 40 MG tablet Take 1 tablet (40 mg total) by mouth daily.   potassium chloride (KLOR-CON) 10 MEQ tablet Take 1 tablet (10 mEq total) by mouth daily.   rosuvastatin (CRESTOR) 20 MG tablet Take 1 tablet (20 mg total) by mouth at bedtime. (Patient not taking: Reported on 12/26/2022)   sertraline (ZOLOFT) 50 MG tablet Take 1 tablet (50 mg total) by mouth daily.   spironolactone (ALDACTONE) 25 MG tablet Take 1 tablet (25 mg total)  by mouth once daily   vitamin B-12 (CYANOCOBALAMIN) 500 MCG tablet Take 500 mcg by mouth daily.   zinc sulfate 220 (50 Zn) MG capsule Take 1 capsule (220 mg total) by mouth daily.   [DISCONTINUED] QUEtiapine (SEROQUEL) 25 MG tablet Take 1 tablet (25 mg total) by mouth at bedtime.   Facility-Administered Encounter Medications as of 03/07/2023  Medication   triamcinolone acetonide (KENALOG-40) injection 60 mg    Allergies (verified) Poison oak extract   History: Past Medical History:  Diagnosis Date   Acute bursitis of left shoulder 01/04/2020   Acute on chronic diastolic congestive heart failure (HCC) 11/13/2021   Last Assessment & Plan:  Formatting of this note is different from the original. Pertinent Data:   Current medication includes: furosemide - 20 mg lisinopriL - 10 mg metoprolol succinate - 25 mg.  BNP (pg/mL)  Date Value  11/15/2021 433.6 (H)   eGFR (mL/min/1.53m*2)  Date Value  11/15/2021 >60.0    BP Readings from Last 3 Encounters:  11/30/21 (!) 147/82  11/15/21 (!) 109/54  11/13/21 (!) 134/74     Acute respiratory failure with hypoxia (HCC) 03/17/2020   Angina pectoris (HCC) 10/07/2019   Arthritis    Atherosclerosis of aorta (HCC) 09/24/2019   Bilateral foot-drop 01/18/2017   BMI 27.0-27.9,adult 09/24/2019   Coronary artery disease involving native coronary artery of native heart without angina pectoris 11/01/2015   Overview:  Bypass surgery in 1996 Overview:  Overview:  Bypass surgery in 1996  Last Assessment & Plan:  Continue his current regimen.  Follow up as noted.   CVA (cerebral vascular accident) (HCC) 04/03/2016   Last Assessment & Plan:  The patient underwent extensive CVA workup.  MRI is negative.  He does have aortic stenosis by echocardiogram.  Upon review of Care everywhere he is followed by Dr. Bing Matter, cardiologist for this.  Carotid ultrasound negative for hemodynamically significant stenosis.  Lipid panel within normal limits.  He has not had any further  episodes of dizziness or nausea vomiting.    Demand ischemia (HCC) 11/14/2021   Last Assessment & Plan:  Formatting of this note might be different from the original. Troponin flat No anginal symptoms Formatting of this note might be different from the original. Last Assessment & Plan:  Formatting of this note might be different from the original. Troponin flat No anginal symptoms   Dementia (HCC)    Depression    DNR (do not resuscitate) 04/03/2016   Overview:  I had an extensive discussion with the patient on 04/03/2016 regarding his end of life wishes.  He and his daughter agree that do not resuscitate is in keeping with his beliefs.   Dyslipidemia 11/01/2015   Last Assessment & Plan:  Formatting of this note might be different from the original. Cholesterol 136   Triglycerides  83  HDL  50.1  LDL Calculated  69  VLDL Cholesterol Cal  17   Continue statin.   Dyspnea    with activity   Essential hypertension 11/01/2015   Last Assessment & Plan:  Resume home medications.  Follow-up as noted above.   Generalized weakness 03/13/2020   GERD (gastroesophageal reflux disease)    Herpes zoster 05/19/2019   History of aortic stenosis 03/11/2020   History of CVA (cerebrovascular accident) 11/14/2021   Last Assessment & Plan:  Formatting of this note might be different from the original. Neurologically intact upon assessment -continue aspirin, statin   Hypertensive heart disease with heart failure (HCC) 11/01/2015   Last Assessment & Plan:  Resume home medications.  Follow-up as noted above.   Myocardial infarction (HCC)    approx 1994   Neuropathy    Peripheral neuropathy 11/14/2021   Last Assessment & Plan:  Formatting of this note might be different from the original. Bilateral lower extremity cool to touch with diminished sensation Patient reports findings are chronic Continue home dose Lyrica Formatting of this note might be different from the original. Last Assessment & Plan:  Formatting of this  note might be different from the original. 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   Thrombocytopenia (HCC) 03/13/2020   Trochanteric bursitis of left hip 01/19/2021   Past Surgical History:  Procedure Laterality Date   CARDIAC CATHETERIZATION     CARDIAC SURGERY     CATARACT EXTRACTION, BILATERAL     CORONARY ARTERY BYPASS GRAFT     CORONARY STENT INTERVENTION N/A 10/07/2019   Procedure: CORONARY STENT INTERVENTION;  Surgeon: Tonny Bollman, MD;  Location: [facility] INVASIVE CV LAB;  Service: Cardiovascular;  Laterality: N/A;   EYE SURGERY     HEMORROIDECTOMY     RIGHT/LEFT HEART CATH AND CORONARY/GRAFT ANGIOGRAPHY N/A 10/07/2019   Procedure: RIGHT/LEFT HEART CATH AND CORONARY/GRAFT ANGIOGRAPHY;  Surgeon: Tonny Bollman, MD;  Location: [facility] INVASIVE CV LAB;  Service: Cardiovascular;  Laterality: N/A;   TEE WITHOUT CARDIOVERSION N/A 10/27/2019   Procedure: TRANSESOPHAGEAL ECHOCARDIOGRAM (TEE);  Surgeon: Kathleene Hazel, MD;  Location: [facility] INVASIVE CV LAB;  Service: Open Heart Surgery;  Laterality: N/A;   TRANSCATHETER AORTIC VALVE REPLACEMENT, TRANSFEMORAL N/A 10/27/2019   Procedure: TRANSCATHETER AORTIC VALVE REPLACEMENT, TRANSFEMORAL;  Surgeon: Kathleene Hazel, MD;  Location: [facility] INVASIVE CV LAB;  Service: Open Heart Surgery;  Laterality: N/A;   Family History  Problem Relation Age of Onset   Cancer Mother    Diabetes Mother    Kidney disease Mother    Stroke Mother    Sleep disorder Mother    Arthritis Father    Migraines Father     Hypertension Father    Sleep disorder Father    Arthritis Brother    Cancer Brother    Social History   Socioeconomic History   Marital status: Widowed    Spouse name: Dorann Lodge   Number of children: 2   Years of education: Not on file   Highest education level: Not on file  Occupational History   Occupation: Retired  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  He has one son that lives with him and one daughter that lives beside of him. He is close with his family.  His sister cooks meals for him regularly.  He has friends at the cafe he visits regularly.   Social Drivers of Corporate investment banker Strain: Low Risk  (03/07/2023)   Overall Financial Resource Strain (CARDIA)    Difficulty of Paying Living Expenses: Not hard at all  Food Insecurity: No Food Insecurity (03/07/2023)   Hunger Vital Sign    Worried About Running Out of Food in the Last Year: Never true    Ran Out of Food in the Last Year: Never true  Transportation Needs: No Transportation Needs (03/07/2023)   PRAPARE - Administrator, Civil Service (Medical): No    Lack of Transportation (Non-Medical): No  Physical Activity: Insufficiently Active (03/07/2023)   Exercise Vital Sign    Days of Exercise per Week: 2 days    Minutes of Exercise per Session: 10 min  Stress: No Stress Concern Present (03/07/2023)   [employer] of Occupational Health - Occupational Stress Questionnaire    Feeling of Stress : Not at all  Social Connections: Moderately  Isolated (03/07/2023)   Social Connection and Isolation Panel [NHANES]    Frequency of Communication with Friends and Family: More than three times a week    Frequency of Social Gatherings with Friends and Family: More than three times a week    Attends Religious Services: 1 to 4 times per year    Active Member of [employer] or Organizations: No    Attends Banker Meetings: Never    Marital Status: Widowed    Tobacco Counseling Counseling given: Not Answered   Clinical Intake:  Pre-visit preparation completed: Yes  Pain : 0-10 Pain Score: 8  Pain Type: Chronic pain Pain Location: Shoulder Pain Orientation: Right, Left     BMI - recorded: 23.4 Nutritional Status: BMI of 19-24  Normal Nutritional Risks: None Diabetes: No  How often do you need to have someone help you when you read instructions, pamphlets, or other written materials from your doctor or pharmacy?: 1 - Never What is the last grade level you completed in school?: 7TH GRADE  Interpreter Needed?: No  Information entered by :: Susie Cassette, LPN.   Activities of Daily Living    03/07/2023    1:07 PM  In your present state of health, do you have any difficulty performing the following activities:  Hearing? 1  Vision? 0  Difficulty concentrating or making decisions? 0  Walking or climbing stairs? 0  Dressing or bathing? 0  Doing errands, shopping? 0  Preparing Food and eating ? N  Using the Toilet? N  In the past six months, have you accidently leaked urine? N  Do you have problems with loss of bowel control? N  Managing your Medications? N  Managing your Finances?  N  Housekeeping or managing your Housekeeping? N    Patient Care Team: Blane Ohara, MD as PCP - General (Family Medicine) Georgeanna Lea, MD as PCP - Cardiology (Cardiology) Zettie Pho, [facility] (Inactive) as Pharmacist (Pharmacist)  Indicate any recent Medical Services you may have received from other than [facility]  providers in the past year (date may be approximate).     Assessment:   This is a routine wellness examination for Jack Hodges.  Hearing/Vision screen Hearing Screening - Comments:: Patient has hearing difficulties. No hearing aids.  Vision Screening - Comments:: No rx glasses - not up to date with routine eye exams. Cataracts removed and lens implanted.    Goals Addressed             This Visit's Progress    Client understands the importance of follow-up with providers by attending scheduled visits        Depression Screen    03/07/2023    1:07 PM 12/26/2022   11:30 AM 01/24/2022    8:26 PM 10/11/2021    8:43 AM 08/01/2021    2:17 PM 05/16/2021   10:32 AM 07/13/2020    8:57 AM  PHQ 2/9 Scores  PHQ - 2 Score 0 0 4 4 0 0 2  PHQ- 9 Score 3  10 14  2 2     Fall Risk    03/07/2023    1:07 PM 12/26/2022   11:30 AM 01/24/2022    8:26 PM 08/01/2021    2:17 PM 05/16/2021   10:32 AM  Fall Risk   Falls in the past year? 0 0 0  1  Number falls in past yr: 0 0 0 1 1  Injury with Fall? 0 0 0 0 1  Risk for fall due to : No Fall Risks Impaired balance/gait;No Fall Risks Impaired balance/gait    Follow up Falls prevention discussed Falls evaluation completed;Falls prevention discussed Falls evaluation completed;Education provided;Falls prevention discussed Falls evaluation completed     MEDICARE RISK AT HOME: Medicare Risk at Home Any stairs in or around the home?: Yes (basement) If so, are there any without handrails?: No Home free of loose throw rugs in walkways, pet beds, electrical cords, etc?: Yes Adequate lighting in your home to reduce risk of falls?: Yes Life alert?: No Use of a cane, walker or w/c?: Yes Grab bars in the bathroom?: Yes Shower chair or bench in shower?: No Elevated toilet seat or a handicapped toilet?: No  TIMED UP AND GO:  Was the test performed?  No    Cognitive Function:    03/07/2023    1:09 PM  MMSE - Mini Mental State Exam  Not completed: Unable to  complete        03/07/2023    1:09 PM 01/24/2022    8:29 PM 07/13/2020    9:42 AM  6CIT Screen  What Year? 0 points 0 points 0 points  What month? 0 points 0 points 0 points  What time? 0 points 0 points 0 points  Count back from 20 0 points 0 points 0 points  Months in reverse 0 points 2 points 2 points  Repeat phrase 0 points 0 points 0 points  Total Score 0 points 2 points 2 points    Immunizations Immunization History  Administered Date(s) Administered   Fluad Quad(high Dose 65+) 12/10/2018, 01/25/2020, 01/12/2021, 01/08/2022   Fluad Trivalent(High Dose 65+) 12/26/2022   Pneumococcal Conjugate-13 12/20/2017   Pneumococcal Polysaccharide-23 03/20/2012   Tdap 07/03/2019  Zoster Recombinant(Shingrix) 09/02/2020, 07/06/2022    TDAP status: Up to date  Flu Vaccine status: Up to date  Pneumococcal vaccine status: Up to date  Covid-19 vaccine status: Declined, Education has been provided regarding the importance of this vaccine but patient still declined. Advised may receive this vaccine at local pharmacy or Health Dept.or vaccine clinic. Aware to provide a copy of the vaccination record if obtained from local pharmacy or Health Dept. Verbalized acceptance and understanding.  Qualifies for Shingles Vaccine? Yes   Zostavax completed No   Shingrix Completed?: Yes  Screening Tests Health Maintenance  Topic Date Due   Medicare Annual Wellness (AWV)  03/06/2024   DTaP/Tdap/Td (2 - Td or Tdap) 07/02/2029   Pneumonia Vaccine 31+ Years old  Completed   INFLUENZA VACCINE  Completed   Zoster Vaccines- Shingrix  Completed   HPV VACCINES  Aged Out   COVID-19 Vaccine  Discontinued    Health Maintenance  There are no preventive care reminders to display for this patient.   Colorectal cancer screening: No longer required.   Lung Cancer Screening: (Low Dose CT Chest recommended if Age 41-80 years, 20 pack-year currently smoking OR have quit w/in 15years.) does not qualify.    Lung Cancer Screening Referral: no  Additional Screening:  Hepatitis C Screening: does not qualify; Completed: no  Vision Screening: Recommended annual ophthalmology exams for early detection of glaucoma and other disorders of the eye. Is the patient up to date with their annual eye exam?  No  Who is the provider or what is the name of the office in which the patient attends annual eye exams? No If pt is not established with a provider, would they like to be referred to a provider to establish care? No .   Dental Screening: Recommended annual dental exams for proper oral hygiene  Community Resource Referral / Chronic Care Management: CRR required this visit?  No   CCM required this visit?  No     Plan:     I have personally reviewed and noted the following in the patient's chart:   Medical and social history Use of alcohol, tobacco or illicit drugs  Current medications and supplements including opioid prescriptions. Patient is not currently taking opioid prescriptions. Functional ability and status Nutritional status Physical activity Advanced directives List of other physicians Hospitalizations, surgeries, and ER visits in previous 12 months Vitals Screenings to include cognitive, depression, and falls Referrals and appointments  In addition, I have reviewed and discussed with patient certain preventive protocols, quality metrics, and best practice recommendations. A written personalized care plan for preventive services as well as general preventive health recommendations were provided to patient.     Mickeal Needy, LPN   16/12/9602   After Visit Summary: (MyChart) Due to this being a telephonic visit, the after visit summary with patients personalized plan was offered to patient via MyChart   Nurse Notes: See Routine comments.

## 2023-03-07 NOTE — Patient Instructions (Addendum)
Jack Hodges , Thank you for taking time to come for your Medicare Wellness Visit. I appreciate your ongoing commitment to your health goals. Please review the following plan we discussed and let me know if I can assist you in the future.   Referrals/Orders/Follow-Ups/Clinician Recommendations: No  This is a list of the screening recommended for you and due dates:  Health Maintenance  Topic Date Due   Medicare Annual Wellness Visit  03/06/2024   DTaP/Tdap/Td vaccine (2 - Td or Tdap) 07/02/2029   Pneumonia Vaccine  Completed   Flu Shot  Completed   Zoster (Shingles) Vaccine  Completed   HPV Vaccine  Aged Out   COVID-19 Vaccine  Discontinued    Advanced directives: (Copy Requested) Please bring a copy of your health care power of attorney and living will to the office to be added to your chart at your convenience.  Next Medicare Annual Wellness Visit scheduled for next year: Yes

## 2023-03-13 ENCOUNTER — Ambulatory Visit: Payer: HMO | Admitting: Cardiology

## 2023-03-21 ENCOUNTER — Telehealth: Payer: Self-pay

## 2023-03-21 DIAGNOSIS — M25512 Pain in left shoulder: Secondary | ICD-10-CM | POA: Diagnosis not present

## 2023-03-21 DIAGNOSIS — Z760 Encounter for issue of repeat prescription: Secondary | ICD-10-CM | POA: Diagnosis not present

## 2023-03-21 DIAGNOSIS — M25511 Pain in right shoulder: Secondary | ICD-10-CM | POA: Diagnosis not present

## 2023-03-21 NOTE — Telephone Encounter (Signed)
Copied from CRM [id number]. Topic: Clinical - Medical Advice >> Mar 18, 2023  1:29 PM Shelah Lewandowsky wrote: Reason for CRM: Son [name] calling about patient complaining of pain in both arms please call [phone]

## 2023-03-21 NOTE — Telephone Encounter (Signed)
Patient son (terry) stated patient went to ER this morning to be checked out

## 2023-03-29 ENCOUNTER — Encounter (HOSPITAL_COMMUNITY): Payer: Self-pay

## 2023-03-29 ENCOUNTER — Other Ambulatory Visit: Payer: Self-pay

## 2023-03-29 ENCOUNTER — Emergency Department (HOSPITAL_COMMUNITY): Payer: HMO

## 2023-03-29 ENCOUNTER — Emergency Department (HOSPITAL_COMMUNITY)
Admission: EM | Admit: 2023-03-29 | Discharge: 2023-03-30 | Disposition: A | Discharge status: Home / Self Care | Payer: HMO | Attending: Emergency Medicine | Admitting: Emergency Medicine

## 2023-03-29 DIAGNOSIS — H539 Unspecified visual disturbance: Secondary | ICD-10-CM

## 2023-03-29 DIAGNOSIS — I509 Heart failure, unspecified: Secondary | ICD-10-CM | POA: Insufficient documentation

## 2023-03-29 DIAGNOSIS — I2510 Atherosclerotic heart disease of native coronary artery without angina pectoris: Secondary | ICD-10-CM | POA: Insufficient documentation

## 2023-03-29 DIAGNOSIS — I69354 Hemiplegia and hemiparesis following cerebral infarction affecting left non-dominant side: Secondary | ICD-10-CM | POA: Diagnosis not present

## 2023-03-29 DIAGNOSIS — I672 Cerebral atherosclerosis: Secondary | ICD-10-CM | POA: Diagnosis not present

## 2023-03-29 DIAGNOSIS — F0390 Unspecified dementia without behavioral disturbance: Secondary | ICD-10-CM | POA: Diagnosis not present

## 2023-03-29 DIAGNOSIS — I110 Hypertensive heart disease with heart failure: Secondary | ICD-10-CM | POA: Insufficient documentation

## 2023-03-29 DIAGNOSIS — Z79899 Other long term (current) drug therapy: Secondary | ICD-10-CM | POA: Insufficient documentation

## 2023-03-29 DIAGNOSIS — Z7982 Long term (current) use of aspirin: Secondary | ICD-10-CM | POA: Insufficient documentation

## 2023-03-29 DIAGNOSIS — I6622 Occlusion and stenosis of left posterior cerebral artery: Secondary | ICD-10-CM | POA: Diagnosis not present

## 2023-03-29 DIAGNOSIS — G459 Transient cerebral ischemic attack, unspecified: Secondary | ICD-10-CM

## 2023-03-29 DIAGNOSIS — I700 Atherosclerosis of aorta: Secondary | ICD-10-CM | POA: Diagnosis not present

## 2023-03-29 DIAGNOSIS — G319 Degenerative disease of nervous system, unspecified: Secondary | ICD-10-CM | POA: Diagnosis not present

## 2023-03-29 DIAGNOSIS — I10 Essential (primary) hypertension: Secondary | ICD-10-CM | POA: Diagnosis not present

## 2023-03-29 LAB — CBC
HCT: 38.4 % — ABNORMAL LOW (ref 39.0–52.0)
Hemoglobin: 12.6 g/dL — ABNORMAL LOW (ref 13.0–17.0)
MCH: 29 pg (ref 26.0–34.0)
MCHC: 32.8 g/dL (ref 30.0–36.0)
MCV: 88.3 fL (ref 80.0–100.0)
Platelets: 388 10*3/uL (ref 150–400)
RBC: 4.35 MIL/uL (ref 4.22–5.81)
RDW: 18.8 % — ABNORMAL HIGH (ref 11.5–15.5)
WBC: 18.4 10*3/uL — ABNORMAL HIGH (ref 4.0–10.5)
nRBC: 0.2 % (ref 0.0–0.2)

## 2023-03-29 LAB — I-STAT CHEM 8, ED
BUN: 22 mg/dL (ref 8–23)
Calcium, Ion: 0.94 mmol/L — ABNORMAL LOW (ref 1.15–1.40)
Chloride: 105 mmol/L (ref 98–111)
Creatinine, Ser: 1.2 mg/dL (ref 0.61–1.24)
Glucose, Bld: 107 mg/dL — ABNORMAL HIGH (ref 70–99)
HCT: 36 % — ABNORMAL LOW (ref 39.0–52.0)
Hemoglobin: 12.2 g/dL — ABNORMAL LOW (ref 13.0–17.0)
Potassium: 3.6 mmol/L (ref 3.5–5.1)
Sodium: 138 mmol/L (ref 135–145)
TCO2: 24 mmol/L (ref 22–32)

## 2023-03-29 LAB — COMPREHENSIVE METABOLIC PANEL
ALT: 16 U/L (ref 0–44)
AST: 18 U/L (ref 15–41)
Albumin: 3.9 g/dL (ref 3.5–5.0)
Alkaline Phosphatase: 88 U/L (ref 38–126)
Anion gap: 14 (ref 5–15)
BUN: 22 mg/dL (ref 8–23)
CO2: 24 mmol/L (ref 22–32)
Calcium: 9.3 mg/dL (ref 8.9–10.3)
Chloride: 103 mmol/L (ref 98–111)
Creatinine, Ser: 1.23 mg/dL (ref 0.61–1.24)
GFR, Estimated: 56 mL/min — ABNORMAL LOW (ref >60)
Glucose, Bld: 108 mg/dL — ABNORMAL HIGH (ref 70–99)
Potassium: 3.7 mmol/L (ref 3.5–5.1)
Sodium: 141 mmol/L (ref 135–145)
Total Bilirubin: 1.4 mg/dL — ABNORMAL HIGH (ref 0.0–1.2)
Total Protein: 6.7 g/dL (ref 6.5–8.1)

## 2023-03-29 LAB — CBG MONITORING, ED: Glucose-Capillary: 106 mg/dL — ABNORMAL HIGH (ref 70–99)

## 2023-03-29 LAB — PROTIME-INR
INR: 1.4 — ABNORMAL HIGH (ref 0.8–1.2)
Prothrombin Time: 16.9 s — ABNORMAL HIGH (ref 11.4–15.2)

## 2023-03-29 LAB — DIFFERENTIAL
Abs Immature Granulocytes: 0.51 10*3/uL — ABNORMAL HIGH (ref 0.00–0.07)
Basophils Absolute: 0.2 10*3/uL — ABNORMAL HIGH (ref 0.0–0.1)
Basophils Relative: 1 %
Eosinophils Absolute: 0.6 10*3/uL — ABNORMAL HIGH (ref 0.0–0.5)
Eosinophils Relative: 4 %
Immature Granulocytes: 3 %
Lymphocytes Relative: 7 %
Lymphs Abs: 1.4 10*3/uL (ref 0.7–4.0)
Monocytes Absolute: 0.8 10*3/uL (ref 0.1–1.0)
Monocytes Relative: 4 %
Neutro Abs: 14.9 10*3/uL — ABNORMAL HIGH (ref 1.7–7.7)
Neutrophils Relative %: 81 %

## 2023-03-29 LAB — ETHANOL: Alcohol, Ethyl (B): <10 mg/dL (ref <10)

## 2023-03-29 LAB — APTT: aPTT: 37 s — ABNORMAL HIGH (ref 24–36)

## 2023-03-29 MED ORDER — IOHEXOL 350 MG/ML SOLN
75.0000 mL | Freq: Once | INTRAVENOUS | Status: AC | PRN
  Administered 2023-03-29: 75 mL via INTRAVENOUS

## 2023-03-29 MED ORDER — SODIUM CHLORIDE 0.9% FLUSH: 3.0000 mL | Freq: Once | INTRAVENOUS | Status: DC

## 2023-03-29 NOTE — ED Triage Notes (Addendum)
 Pt reports pain in right side of his neck that started yesterday and funny feelings in his right arm that started yesterday. He also reports blurry vision that started today around 1430 but has went away; lasting about 30 minutes. Face is symmetrical and speech is clear. He does report he has trouble walking at baseline but yesterday he has noticed it worsened. Hx of left sided stroke about 4-5 years ago with residual left sided weakness and is about the same.

## 2023-03-29 NOTE — ED Provider Triage Note (Signed)
 Emergency Medicine Provider Triage Evaluation Note  Jack Hodges , a 89 y.o. male  was evaluated in triage.  Pt complains of lateral neck pain.  Review of Systems  Positive: Right lateral neck pain, double vision (resolved) Negative: Headache, lateralizing weakness  Physical Exam  BP 125/81   Pulse 68   Temp 97.7 F (36.5 C)   Resp 16   Ht 5' 6 (1.676 m)   Wt 65.8 kg   SpO2 96%   BMI 23.40 kg/m  Gen:   Awake, no distress   Resp:  Normal effort  MSK:   Moves extremities without difficulty slight left UE and LE weakness (chronic per patient) Other:  No carotid bruits; right neck pain is reproducible to palpation.   Medical Decision Making  Medically screening exam initiated at 5:15 PM.  Appropriate orders placed.  Jack Hodges was informed that the remainder of the evaluation will be completed by another provider, this initial triage assessment does not replace that evaluation, and the importance of remaining in the ED until their evaluation is complete.  Patient to ED for concern of stroke. History of stroke in the past with residual left weakness, mild. Yesterday started having right lateral neck pain that has persisted, and today had a 30-minute episode of diplopia that is now completely resolved. He reports he was told by his doctor these symptoms could represent stroke and should be evaluated.   H/O a-fib, on ASA only    Jack Hodges, [place] 03/29/23 1718

## 2023-03-29 NOTE — ED Provider Notes (Signed)
 [facility] Provider Note   CSN: [id number] Arrival date & time: 03/29/23  1624     History  Chief Complaint  Patient presents with   Blurred Vision    Jack Hodges is a 89 y.o. male.  Workup       This is a 89 year old male with a history of stroke coronary artery disease.  Reports he only takes aspirin .  States that earlier today he had an episode that lasted 30 minutes or less of double vision.  He describes being able to see his cane side-by-side and double.  He has never had this before.  At baseline he has bilateral foot drop and has some difficulty ambulating but has not noted any focal deficits that are new including speech deficits, facial droop, unilateral weakness or numbness.  Symptoms have completely resolved.  Of note, patient does note that yesterday he had acute onset of right sided neck pain.  No history of trauma.  He states the pain is sharp and nonradiating.  Home Medications Prior to Admission medications   Medication Sig Start Date End Date Taking? Authorizing Provider  acetaminophen  (TYLENOL ) 650 MG CR tablet Take 650 mg by mouth every 8 (eight) hours as needed for pain.     [provider]  albuterol  (VENTOLIN  HFA) 108 (90 Base) MCG/ACT inhaler Inhale 2 puffs into the lungs every 2 (two) hours as needed for wheezing or shortness of breath. 04/16/22   Cox, Kirsten, MD  amLODipine  (NORVASC ) 5 MG tablet Take 5 mg by mouth daily. 04/26/22   [provider]  ammonium lactate  (LAC-HYDRIN ) 12 % lotion Apply 1 Application topically 2 (two) times daily. Apply twice daily to arms and hands to increase moisture and reduce easy bruising.  Can also be used safely on neck, chest, and legs as needed 06/08/22   Cox, Abigail, MD  ascorbic acid  (VITAMIN C) 500 MG tablet Take 1 tablet (500 mg total) by mouth daily. 03/17/20   Milissa Tod PARAS, MD  aspirin  EC 81 MG tablet Take 1 tablet (81 mg total) by mouth daily. Swallow  whole. 04/16/22   CoxAbigail, MD  carbidopa -levodopa  (SINEMET ) 25-100 MG tablet Take 1 tablet by mouth at bedtime as needed (rESTLESS LEGS.). 01/28/23   CoxAbigail, MD  carvedilol  (COREG ) 25 MG tablet Take 1 tablet (25 mg total) by mouth 2 (two) times daily. 02/28/23   Cox, Abigail, MD  cholecalciferol  (VITAMIN D3) 25 MCG (1000 UNIT) tablet Take 1 tablet (1,000 Units total) by mouth daily. 04/16/22   CoxAbigail, MD  ezetimibe  (ZETIA ) 10 MG tablet Take 1 tablet (10 mg total) by mouth daily. Patient not taking: Reported on 12/26/2022 07/10/22   CoxAbigail, MD  fluorometholone (FML) 0.1 % ophthalmic suspension Place 1 drop into both eyes daily. 10/25/20   [provider]  furosemide  (LASIX ) 80 MG tablet Take 1 tablet (80 mg total) by mouth daily. 12/06/22 03/06/23  Krasowski, Robert J, MD  nitroGLYCERIN  (NITROSTAT ) 0.4 MG SL tablet Place 1 tablet (0.4 mg total) under the tongue every 5 (five) minutes as needed for chest pain. 10/08/19 12/26/22  Goodrich, Callie E, PA-C  olmesartan  (BENICAR ) 40 MG tablet Take 1 tablet (40 mg total) by mouth daily. 11/06/22   CoxAbigail, MD  pantoprazole  (PROTONIX ) 40 MG tablet Take 1 tablet (40 mg total) by mouth daily. 02/18/23   CoxAbigail, MD  potassium chloride  (KLOR-CON ) 10 MEQ tablet Take 1 tablet (10 mEq total) by  mouth daily. 12/06/22 03/06/23  Krasowski, Robert J, MD  rosuvastatin  (CRESTOR ) 20 MG tablet Take 1 tablet (20 mg total) by mouth at bedtime. Patient not taking: Reported on 12/26/2022 07/27/21   Abran Jerilynn Loving, MD  sertraline  (ZOLOFT ) 50 MG tablet Take 1 tablet (50 mg total) by mouth daily. 01/28/23   Sherre Clapper, MD  spironolactone  (ALDACTONE ) 25 MG tablet Take 1 tablet (25 mg total) by mouth once daily 01/21/23   Cox, Kirsten, MD  vitamin B-12 (CYANOCOBALAMIN ) 500 MCG tablet Take 500 mcg by mouth daily.    [provider]  zinc  sulfate 220 (50 Zn) MG capsule Take 1 capsule (220 mg total) by mouth daily. 04/16/22   CoxClapper,  MD  QUEtiapine  (SEROQUEL ) 25 MG tablet Take 1 tablet (25 mg total) by mouth at bedtime. 08/08/22 08/08/22        Allergies    Poison oak extract    Review of Systems   Review of Systems  Constitutional:  Negative for fever.  Eyes:  Positive for visual disturbance. Negative for photophobia, pain and redness.  Respiratory:  Negative for shortness of breath.   Cardiovascular:  Negative for chest pain.  All other systems reviewed and are negative.   Physical Exam Updated Vital Signs BP (!) 109/48   Pulse 80   Temp (!) 97.4 F (36.3 C)   Resp 16   Ht 1.676 m (5' 6)   Wt 65.8 kg   SpO2 98%   BMI 23.40 kg/m  Physical Exam Vitals and nursing note reviewed.  Constitutional:      Appearance: He is well-developed. He is not ill-appearing.  HENT:     Head: Normocephalic and atraumatic.  Eyes:     Extraocular Movements: Extraocular movements intact.     Pupils: Pupils are equal, round, and reactive to light.     Comments: Visual fields intact, blink to threat intact  Cardiovascular:     Rate and Rhythm: Normal rate and regular rhythm.     Heart sounds: Normal heart sounds. No murmur heard. Pulmonary:     Effort: Pulmonary effort is normal. No respiratory distress.     Breath sounds: Normal breath sounds. No wheezing.  Abdominal:     Palpations: Abdomen is soft.     Tenderness: There is no abdominal tenderness.  Musculoskeletal:     Cervical back: Neck supple.  Lymphadenopathy:     Cervical: No cervical adenopathy.  Skin:    General: Skin is warm and dry.  Neurological:     Mental Status: He is alert and oriented to person, place, and time.     Comments: Cranial nerves II through XII intact, 5 out of 5 strength in all 4 extremities the exception of inability to test his plantar and dorsiflexion of the feet, fluent speech, no dysmetria to finger-nose-finger, no neglect, blink to threat intact bilaterally, visual fields intact  Psychiatric:        Mood and Affect: Mood normal.      ED Results / Procedures / Treatments   Labs (all labs ordered are listed, but only abnormal results are displayed) Labs Reviewed  PROTIME-INR - Abnormal; Notable for the following components:      Result Value   Prothrombin Time 16.9 (*)    INR 1.4 (*)    All other components within normal limits  APTT - Abnormal; Notable for the following components:   aPTT 37 (*)    All other components within normal limits  CBC - Abnormal; Notable  for the following components:   WBC 18.4 (*)    Hemoglobin 12.6 (*)    HCT 38.4 (*)    RDW 18.8 (*)    All other components within normal limits  DIFFERENTIAL - Abnormal; Notable for the following components:   Neutro Abs 14.9 (*)    Eosinophils Absolute 0.6 (*)    Basophils Absolute 0.2 (*)    Abs Immature Granulocytes 0.51 (*)    All other components within normal limits  COMPREHENSIVE METABOLIC PANEL - Abnormal; Notable for the following components:   Glucose, Bld 108 (*)    Total Bilirubin 1.4 (*)    GFR, Estimated 56 (*)    All other components within normal limits  CBG MONITORING, ED - Abnormal; Notable for the following components:   Glucose-Capillary 106 (*)    All other components within normal limits  I-STAT CHEM 8, ED - Abnormal; Notable for the following components:   Glucose, Bld 107 (*)    Calcium , Ion 0.94 (*)    Hemoglobin 12.2 (*)    HCT 36.0 (*)    All other components within normal limits  ETHANOL  CBG MONITORING, ED    EKG EKG Interpretation Date/Time:  Friday March 29 2023 16:48:55 EST Ventricular Rate:  78 PR Interval:    QRS Duration:  130 QT Interval:  434 QTC Calculation: 494 R Axis:   268  Text Interpretation: Atrial fibrillation Right bundle branch block Inferior infarct , age undetermined Anteroseptal infarct , age undetermined Abnormal ECG When compared with ECG of 30-Nov-2022 13:27, PREVIOUS ECG IS PRESENT Confirmed by Bari Pfeiffer ([id number]) on 03/29/2023 5:48:02 PM  Radiology CT ANGIO HEAD  NECK W WO CM Result Date: 03/29/2023 CLINICAL DATA:  Cerebral aneurysm screening, high-risk. Pt reports pain in right side of his neck that started yesterday and funny feelings in his right arm that started yesterday. He also reports blurry vision that started today around 1430 but has went away; lasting about 30 minutes. EXAM: CT ANGIOGRAPHY HEAD AND NECK WITH AND WITHOUT CONTRAST TECHNIQUE: Multidetector CT imaging of the head and neck was performed using the standard protocol during bolus administration of intravenous contrast. Multiplanar CT image reconstructions and MIPs were obtained to evaluate the vascular anatomy. Carotid stenosis measurements (when applicable) are obtained utilizing NASCET criteria, using the distal internal carotid diameter as the denominator. RADIATION DOSE REDUCTION: This exam was performed according to the departmental dose-optimization program which includes automated exposure control, adjustment of the mA and/or kV according to patient size and/or use of iterative reconstruction technique. CONTRAST:  75mL OMNIPAQUE  IOHEXOL  350 MG/ML SOLN COMPARISON:  None Available. FINDINGS: CT HEAD FINDINGS Brain: No evidence of acute large vascular territory infarction, hemorrhage, hydrocephalus, extra-axial collection or mass lesion/mass effect. Vascular: See below. Skull: No acute fracture. Sinuses/Orbits: Clear sinuses.  No acute orbital findings. Other: No mastoid effusions. Review of the MIP images confirms the above findings CTA NECK FINDINGS Aortic arch: Aortic atherosclerosis. Great vessel origins are patent Right carotid system: Atherosclerosis at the carotid bifurcation without greater than 50% stenosis. Left carotid system: Atherosclerosis at the carotid bifurcation and involving the proximal ICA with approximately 50% stenosis. Vertebral arteries: Severe stenosis of the right vertebral artery is dural margin with mildly diminished opacification of the more proximal vertebral artery  in the neck. Left vertebral artery is patent without significant stenosis. Skeleton: Severe multilevel degenerative change. Other neck: No acute abnormality on limited assessment. Upper chest: Visualized lung apices are clear. Review of the MIP images confirms the  above findings CTA HEAD FINDINGS Limited study due to venous contrast bolus timing. Anterior circulation: Bilateral intracranial ICAs, MCAs, and ACAs are patent proximally. Severe bilateral M2 MCA stenosis. The distal MCA and ACA branches are poorly evaluated due to venous contrast timing. Atherosclerotic irregularity of the more distal MCA branches bilaterally. No aneurysm identified. Posterior circulation: Bilateral intradural vertebral arteries, basilar artery and bilateral posterior cerebral arteries are patent. Severe left P2 PCA stenosis. Fetal type PCAs bilaterally. No aneurysm identified. Venous sinuses: As permitted by contrast timing, patent. Review of the MIP images confirms the above findings IMPRESSION: CT head: No evidence of acute intracranial abnormality. An MRI could provide more sensitive evaluation for acute infarct if clinically warranted. CTA: 1. No emergent proximal large vessel occlusion. Limited distal vessel evaluation due to delayed/venous contrast bolus timing. 2. Severe left P2 PCA stenosis. 3. Severe stenosis of the right vertebral artery is dural margin. 4. Approximately 50% stenosis of the proximal left ICA in the neck. 5.  Aortic Atherosclerosis (ICD10-I70.0). Electronically Signed   By: Gilmore GORMAN Molt M.D.   On: 03/29/2023 21:47    Procedures Procedures    Medications Ordered in ED Medications  sodium chloride  flush (NS) 0.9 % injection 3 mL (3 mLs Intravenous Not Given 03/29/23 1847)  iohexol  (OMNIPAQUE ) 350 MG/ML injection 75 mL (75 mLs Intravenous Contrast Given 03/29/23 1920)    ED Course/ Medical Decision Making/ A&P Clinical Course as of 03/29/23 2328  Fri Mar 29, 2023  2327 Spoke to Dr. Jerrie.  Agrees  with getting MRI.  Concerned that he may have had a legitimate TIA and that he is not on appropriate anticoagulation.  It appears that his cardiologist has been hesitant to place him on this secondary to his fall risk with his bilateral foot drop.  I have discussed the risk and benefits with the patient.  He is considering these while we await MRI. [CH]    Clinical Course User Index [CH] Levent Kornegay, Charmaine FALCON, MD                                 Medical Decision Making Amount and/or Complexity of Data Reviewed Labs: ordered. Radiology: ordered.   This patient presents to the ED for concern of vision changes, this involves an extensive number of treatment options, and is a complaint that carries with it a high risk of complications and morbidity.  I considered the following differential and admission for this acute, potentially life threatening condition.  The differential diagnosis includes stroke, CRAO, TIA, ophthalmologic emergency such as retinal detachment,  MDM:    This is a 89 year old male who presents with visual disturbance.  Highly concerning for possible TIA or CRAO.  He is currently symptom-free.  No obvious neurologic deficits on exam.  He is at high risk with his A-fib.  Also concerning for potential circulation abnormality with pain in the neck.  CTA obtained.  CT does show significant stenotic disease of the carotid and PCA.  No other intracranial bleed or abnormality.  See clinical discussion above.  Will obtain MRI.  (Labs, imaging, consults)  Labs: I Ordered, and personally interpreted labs.  The pertinent results include: Stroke labs  Imaging Studies ordered: I ordered imaging studies including CTA head and neck I independently visualized and interpreted imaging. I agree with the radiologist interpretation  Additional history obtained from family at bedside.  External records from outside source obtained and reviewed including cardiology notes  Cardiac Monitoring: The  patient was maintained on a cardiac monitor.  If on the cardiac monitor, I personally viewed and interpreted the cardiac monitored which showed an underlying rhythm of: Atrial fibrillation  Reevaluation: After the interventions noted above, I reevaluated the patient and found that they have :resolved  Social Determinants of Health:  lives independently  Disposition: Pending  Co morbidities that complicate the patient evaluation  Past Medical History:  Diagnosis Date   Acute bursitis of left shoulder 01/04/2020   Acute on chronic diastolic congestive heart failure (HCC) 11/13/2021   Last Assessment & Plan:  Formatting of this note is different from the original. Pertinent Data:   Current medication includes: furosemide  - 20 mg lisinopriL  - 10 mg metoprolol  succinate - 25 mg.  BNP (pg/mL)  Date Value  11/15/2021 433.6 (H)   eGFR (mL/min/1.23m*2)  Date Value  11/15/2021 >60.0    BP Readings from Last 3 Encounters:  11/30/21 (!) 147/82  11/15/21 (!) 109/54  11/13/21 (!) 134/74     Acute respiratory failure with hypoxia (HCC) 03/17/2020   Angina pectoris (HCC) 10/07/2019   Arthritis    Atherosclerosis of aorta (HCC) 09/24/2019   Bilateral foot-drop 01/18/2017   BMI 27.0-27.9,adult 09/24/2019   Coronary artery disease involving native coronary artery of native heart without angina pectoris 11/01/2015   Overview:  Bypass surgery in 1996 Overview:  Overview:  Bypass surgery in 1996  Last Assessment & Plan:  Continue his current regimen.  Follow up as noted.   CVA (cerebral vascular accident) (HCC) 04/03/2016   Last Assessment & Plan:  The patient underwent extensive CVA workup.  MRI is negative.  He does have aortic stenosis by echocardiogram.  Upon review of Care everywhere he is followed by Dr. Bernie, cardiologist for this.  Carotid ultrasound negative for hemodynamically significant stenosis.  Lipid panel within normal limits.  He has not had any further episodes of dizziness or nausea  vomiting.    Demand ischemia (HCC) 11/14/2021   Last Assessment & Plan:  Formatting of this note might be different from the original. Troponin flat No anginal symptoms Formatting of this note might be different from the original. Last Assessment & Plan:  Formatting of this note might be different from the original. Troponin flat No anginal symptoms   Dementia (HCC)    Depression    DNR (do not resuscitate) 04/03/2016   Overview:  I had an extensive discussion with the patient on 04/03/2016 regarding his end of life wishes.  He and his daughter agree that do not resuscitate is in keeping with his beliefs.   Dyslipidemia 11/01/2015   Last Assessment & Plan:  Formatting of this note might be different from the original. Cholesterol 136   Triglycerides  83  HDL  50.1  LDL Calculated  69  VLDL Cholesterol Cal  17   Continue statin.   Dyspnea    with activity   Essential hypertension 11/01/2015   Last Assessment & Plan:  Resume home medications.  Follow-up as noted above.   Generalized weakness 03/13/2020   GERD (gastroesophageal reflux disease)    Herpes zoster 05/19/2019   History of aortic stenosis 03/11/2020   History of CVA (cerebrovascular accident) 11/14/2021   Last Assessment & Plan:  Formatting of this note might be different from the original. Neurologically intact upon assessment -continue aspirin , statin   Hypertensive heart disease with heart failure (HCC) 11/01/2015   Last Assessment & Plan:  Resume home medications.  Follow-up as noted above.  Myocardial infarction (HCC)    approx 1994   Neuropathy    Peripheral neuropathy 11/14/2021   Last Assessment & Plan:  Formatting of this note might be different from the original. Bilateral lower extremity cool to touch with diminished sensation Patient reports findings are chronic Continue home dose Lyrica  Formatting of this note might be different from the original. Last Assessment & Plan:  Formatting of this note might be different from the  original. 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   Thrombocytopenia (HCC) 03/13/2020   Trochanteric bursitis of left hip 01/19/2021     Medicines Meds ordered this encounter  Medications   sodium chloride  flush (NS) 0.9 % injection 3 mL   iohexol  (OMNIPAQUE ) 350 MG/ML injection 75 mL    I have reviewed the patients home medicines and have made adjustments as needed  Problem List / ED Course: Problem List Items Addressed This Visit   None Visit Diagnoses       Visual disturbance    -  Primary                   Final Clinical Impression(s) / ED Diagnoses Final diagnoses:  Visual disturbance    Rx / DC Orders ED Discharge Orders     None         Bari Charmaine FALCON, MD 03/29/23 [phone]

## 2023-03-29 NOTE — ED Notes (Signed)
 Melvenia Beam, PA at bedside to assess the patient.

## 2023-03-30 ENCOUNTER — Emergency Department (HOSPITAL_COMMUNITY): Payer: HMO

## 2023-03-30 DIAGNOSIS — G459 Transient cerebral ischemic attack, unspecified: Secondary | ICD-10-CM

## 2023-03-30 DIAGNOSIS — G319 Degenerative disease of nervous system, unspecified: Secondary | ICD-10-CM | POA: Diagnosis not present

## 2023-03-30 HISTORY — DX: Transient cerebral ischemic attack, unspecified: G45.9

## 2023-03-30 MED ORDER — APIXABAN 2.5 MG PO TABS: 2.5000 mg | ORAL_TABLET | Freq: Two times a day (BID) | ORAL | 0 refills | Status: DC

## 2023-03-30 NOTE — ED Notes (Signed)
 Went to MRI

## 2023-03-30 NOTE — Plan of Care (Addendum)
 These are curbside recommendations based upon the information readily available in the chart on brief review as well as history and examination information provided to me by requesting provider and do not replace a full detailed consult  I was asked to comment on benefit of TIA/stroke workup in this 89 year old gentleman who had a transient episode of double vision but is otherwise back to baseline per ED provider Dr. Bari and discussed with Dr. Trine via phone  On review of his chart, he has a known diagnosis of atrial fibrillation, new onset in September 2024.  Discussion with his outpatient cardiologist at the time was not to use anticoagulation given his falls risk. 11/30/2022 note: Atrial fibrillation which is a new diagnosis. I am extremely reluctant to consider anticoagulation with his unsteady gait bilateral foot drops and frequent falls. I do not think he will be candidate for Watchman device. He is a very difficult situation. He is on aspirin  right now which I will continue. I will check his CBC Chem-7 as well as stool for guaiac with potential anticipation of anticoagulation which big reservation as described above.  I recommended MRI brain without contrast to confirm no acute intracranial process which would be a potential contraindication to starting anticoagulation.  At that point it would be a risk/benefit discussion with the patient regarding anticoagulation despite his risk of falls.   In the absence of any other medical symptoms, further inpatient workup is not likely to change the risk/benefit analysis given he does have known atrial fibrillation  Certainly his atherosclerotic disease also puts him at increased risk; in the absence of indication for anticoagulation this would typically be managed with dual antiplatelet therapy; however I would not recommend triple therapy in this patient.  From a neurological perspective, if patient does not desire anticoagulation, aspirin  325  mg daily should be considered  CTA head and neck 1. No emergent proximal large vessel occlusion. Limited distal vessel evaluation due to delayed/venous contrast bolus timing. 2. Severe left P2 PCA stenosis. 3. Severe stenosis of the right vertebral artery is dural margin. 4. Approximately 50% stenosis of the proximal left ICA in the neck. 5.  Aortic Atherosclerosis (ICD10-I70.0).  MRI brain on my review without acute intracranial process, few scattered chronic microhemorrhages (<10), nonspecific pattern, radiology agrees no acute intracranial process   CHA2DS2-VASc score 7 for this patient (age greater than 75, 2 points; CHF history 1 point; hypertension history, 1 point; stroke history 2 points; vascular disease history 1 point) Per cards note Hypertension: Complications: CONGESTIVE HEART FAILURE. Recent echo showed mild decreased EF 50-55% 7 points Stroke risk was 11.2% per year in >90,000 patients (the [place] Atrial Fibrillation Cohort Study) and 15.7% risk of stroke/TIA/systemic embolism.  Lola Jernigan MD-PhD [facility] [phone]  15 min spent in care of patient, majority in discussion with EDP, EDP to inform patient of neurological consultation

## 2023-03-30 NOTE — ED Provider Notes (Signed)
 I assumed care of this patient from previous provider.  Please see their note for further details of history, exam, and MDM.   Briefly patient is a 89 y.o. male who presented after a 30-minute episode of diplopia.  Concerning for TIA.  Notable for significant atherosclerosis on CTA head and neck.  Currently awaiting MRI and neurology recommendations.  MRI negative.  Neurology recommended anticoagulation on patient.  If patient does not want anticoagulation, they recommended 325 mg of aspirin .  Risks versus benefit discussion regarding initiation of anticoagulation given his history of bilateral foot drop and falls.  Patient reports that he only falls every so often stated that he last fell several months ago.  Patient accepted these and opted for anticoagulation.  Recommended close follow-up with neurology.  The patient appears reasonably screened and/or stabilized for discharge and I doubt any other medical condition or other [facility] requiring further screening, evaluation, or treatment in the ED at this time. I have discussed the findings, Dx and Tx plan with the patient/family who expressed understanding and agree(s) with the plan. Discharge instructions discussed at length. The patient/family was given strict return precautions who verbalized understanding of the instructions. No further questions at time of discharge.  Disposition: Discharge  Condition: Good  ED Discharge Orders          Ordered    apixaban  (ELIQUIS ) 2.5 MG TABS tablet  2 times daily        03/30/23 0535             Follow Up: Sherre Clapper, MD [address] [place] [address] [phone]  Call  to schedule an appointment for close follow up  [facility] [address]     [address] [place]  [address] [phone] Call  to schedule an appointment for close follow up to manage stroke risk factors         Sharika Mosquera, Raynell Moder, MD 03/30/23 [phone]

## 2023-03-30 NOTE — ED Notes (Signed)
 Contacted pt's son [name] regarding transport home. [name] states he will try to contact grandddaughter for ride; family reports they live approx 1 hour away.

## 2023-04-04 ENCOUNTER — Ambulatory Visit: Payer: HMO | Attending: Cardiology | Admitting: Cardiology

## 2023-04-04 ENCOUNTER — Encounter: Payer: Self-pay | Admitting: Cardiology

## 2023-04-04 VITALS — BP 118/4 | HR 1 | Ht 65.0 in | Wt 152.6 lb

## 2023-04-04 DIAGNOSIS — I4819 Other persistent atrial fibrillation: Secondary | ICD-10-CM

## 2023-04-04 DIAGNOSIS — I2510 Atherosclerotic heart disease of native coronary artery without angina pectoris: Secondary | ICD-10-CM

## 2023-04-04 DIAGNOSIS — Z951 Presence of aortocoronary bypass graft: Secondary | ICD-10-CM | POA: Diagnosis not present

## 2023-04-04 DIAGNOSIS — I5033 Acute on chronic diastolic (congestive) heart failure: Secondary | ICD-10-CM | POA: Diagnosis not present

## 2023-04-04 DIAGNOSIS — Z8673 Personal history of transient ischemic attack (TIA), and cerebral infarction without residual deficits: Secondary | ICD-10-CM

## 2023-04-04 NOTE — Progress Notes (Signed)
 Cardiology Office Note:    Date:  04/04/2023   ID:  Jack Hodges, DOB 11/11/1930, MRN [id number]  PCP:  Sherre Clapper, MD  Cardiologist:  [name], MD    Referring MD: Sherre Clapper, MD   Chief Complaint  Patient presents with   stroke follow up    History of Present Illness:    Jack Hodges is a 89 y.o. male   with past medical history significant for coronary artery disease, in 1996 he required coronary artery bypass graft.  Also in summer 2020 when he got cardiac catheterization done he underwent PTCA and stenting of left anterior descending artery in view of the fact that he got completely occluded LIMA going to [facility].  That cardiac catheterization was done in preparation for TAVI that eventually ended up having done on 10/27/2019.  He received Edwards sapiens 3 26 mm valve.  He also got history of bilateral foot drop, essential hypertension, dyslipidemia.   Comes today to months for follow-up.  He was find to have double vision couple days ago and not going to the emergency room.  He did have MRI of the brain done likely no stroke, he did have CT angio of the neck which shows some ventricular issues.  He was put on 2.5 mg of Eliquis  twice daily discharge home.  Since that time he is doing fine.  Denies have any chest pain tightness squeezing pressure burning chest.  No more TIA/CVA-like symptoms  Past Medical History:  Diagnosis Date   Acute bursitis of left shoulder 01/04/2020   Acute on chronic diastolic congestive heart failure (HCC) 11/13/2021   Last Assessment & Plan:  Formatting of this note is different from the original. Pertinent Data:   Current medication includes: furosemide  - 20 mg lisinopriL  - 10 mg metoprolol  succinate - 25 mg.  BNP (pg/mL)  Date Value  11/15/2021 433.6 (H)   eGFR (mL/min/1.69m*2)  Date Value  11/15/2021 >60.0    BP Readings from Last 3 Encounters:  11/30/21 (!) 147/82  11/15/21 (!) 109/54  11/13/21 (!) 134/74     Acute respiratory failure with hypoxia  (HCC) 03/17/2020   Angina pectoris (HCC) 10/07/2019   Arthritis    Atherosclerosis of aorta (HCC) 09/24/2019   Bilateral foot-drop 01/18/2017   BMI 27.0-27.9,adult 09/24/2019   Coronary artery disease involving native coronary artery of native heart without angina pectoris 11/01/2015   Overview:  Bypass surgery in 1996 Overview:  Overview:  Bypass surgery in 1996  Last Assessment & Plan:  Continue his current regimen.  Follow up as noted.   CVA (cerebral vascular accident) (HCC) 04/03/2016   Last Assessment & Plan:  The patient underwent extensive CVA workup.  MRI is negative.  He does have aortic stenosis by echocardiogram.  Upon review of Care everywhere he is followed by Dr. Fitch, cardiologist for this.  Carotid ultrasound negative for hemodynamically significant stenosis.  Lipid panel within normal limits.  He has not had any further episodes of dizziness or nausea vomiting.    Demand ischemia (HCC) 11/14/2021   Last Assessment & Plan:  Formatting of this note might be different from the original. Troponin flat No anginal symptoms Formatting of this note might be different from the original. Last Assessment & Plan:  Formatting of this note might be different from the original. Troponin flat No anginal symptoms   Dementia (HCC)    Depression    DNR (do not resuscitate) 04/03/2016   Overview:  I had an extensive discussion  with the patient on 04/03/2016 regarding his end of life wishes.  He and his daughter agree that do not resuscitate is in keeping with his beliefs.   Dyslipidemia 11/01/2015   Last Assessment & Plan:  Formatting of this note might be different from the original. Cholesterol 136   Triglycerides  83  HDL  50.1  LDL Calculated  69  VLDL Cholesterol Cal  17   Continue statin.   Dyspnea    with activity   Essential hypertension 11/01/2015   Last Assessment & Plan:  Resume home medications.  Follow-up as noted above.   Generalized weakness 03/13/2020   GERD (gastroesophageal  reflux disease)    Herpes zoster 05/19/2019   History of aortic stenosis 03/11/2020   History of CVA (cerebrovascular accident) 11/14/2021   Last Assessment & Plan:  Formatting of this note might be different from the original. Neurologically intact upon assessment -continue aspirin , statin   Hypertensive heart disease with heart failure (HCC) 11/01/2015   Last Assessment & Plan:  Resume home medications.  Follow-up as noted above.   Myocardial infarction (HCC)    approx 1994   Neuropathy    Peripheral neuropathy 11/14/2021   Last Assessment & Plan:  Formatting of this note might be different from the original. Bilateral lower extremity cool to touch with diminished sensation Patient reports findings are chronic Continue home dose Lyrica  Formatting of this note might be different from the original. Last Assessment & Plan:  Formatting of this note might be different from the original. 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   Thrombocytopenia (HCC) 03/13/2020   Trochanteric bursitis of left hip 01/19/2021    Past Surgical History:  Procedure Laterality Date   CARDIAC CATHETERIZATION     CARDIAC SURGERY     CATARACT EXTRACTION, BILATERAL     CORONARY ARTERY BYPASS GRAFT     CORONARY STENT INTERVENTION N/A 10/07/2019   Procedure: CORONARY STENT INTERVENTION;  Surgeon: Wonda Sharper, MD;  Location: [facility] INVASIVE CV LAB;  Service: Cardiovascular;  Laterality: N/A;   EYE SURGERY     HEMORROIDECTOMY     RIGHT/LEFT HEART CATH AND CORONARY/GRAFT  ANGIOGRAPHY N/A 10/07/2019   Procedure: RIGHT/LEFT HEART CATH AND CORONARY/GRAFT ANGIOGRAPHY;  Surgeon: Wonda Sharper, MD;  Location: [facility] INVASIVE CV LAB;  Service: Cardiovascular;  Laterality: N/A;   TEE WITHOUT CARDIOVERSION N/A 10/27/2019   Procedure: TRANSESOPHAGEAL ECHOCARDIOGRAM (TEE);  Surgeon: Verlin Lonni BIRCH, MD;  Location: [facility] INVASIVE CV LAB;  Service: Open Heart Surgery;  Laterality: N/A;   TRANSCATHETER AORTIC VALVE REPLACEMENT, TRANSFEMORAL N/A 10/27/2019   Procedure: TRANSCATHETER AORTIC VALVE REPLACEMENT, TRANSFEMORAL;  Surgeon: Verlin Lonni BIRCH, MD;  Location: [facility] INVASIVE CV LAB;  Service: Open Heart Surgery;  Laterality: N/A;    Current Medications: Current Meds  Medication Sig   acetaminophen  (TYLENOL ) 650 MG CR tablet Take 650 mg by mouth every 8 (eight) hours as needed for pain.    albuterol  (VENTOLIN  HFA) 108 (90 Base) MCG/ACT inhaler Inhale 2 puffs into the lungs every 2 (two) hours as needed for  wheezing or shortness of breath.   amLODipine  (NORVASC ) 5 MG tablet Take 5 mg by mouth daily.   ammonium lactate  (LAC-HYDRIN ) 12 % lotion Apply 1 Application topically 2 (two) times daily. Apply twice daily to arms and hands to increase moisture and reduce easy bruising.  Can also be used safely on neck, chest, and legs as needed   apixaban  (ELIQUIS ) 2.5 MG TABS tablet Take 1 tablet (2.5 mg total) by mouth 2 (two) times daily.   ascorbic acid  (VITAMIN C) 500 MG tablet Take 1 tablet (500 mg total) by mouth daily.   aspirin  EC 81 MG tablet Take 1 tablet (81 mg total) by mouth daily. Swallow whole.   carbidopa -levodopa  (SINEMET ) 25-100 MG tablet Take 1 tablet by mouth at bedtime as needed (rESTLESS LEGS.).   carvedilol  (COREG ) 25 MG tablet Take 1 tablet (25 mg total) by mouth 2 (two) times daily.   cholecalciferol  (VITAMIN D3) 25 MCG (1000 UNIT) tablet Take 1 tablet (1,000 Units total) by mouth daily.   ezetimibe  (ZETIA ) 10 MG tablet Take 1 tablet (10 mg total) by mouth  daily.   fluorometholone (FML) 0.1 % ophthalmic suspension Place 1 drop into both eyes daily.   furosemide  (LASIX ) 80 MG tablet Take 1 tablet (80 mg total) by mouth daily.   gabapentin  (NEURONTIN ) 300 MG capsule Take 300 mg by mouth 3 (three) times daily.   meclizine  (ANTIVERT ) 25 MG tablet Take 25 mg by mouth every 6 (six) hours.   neomycin-polymyxin b-dexamethasone  (MAXITROL) 3.5-10000-0.1 SUSP Place 1 drop into both eyes every 6 (six) hours.   nitroGLYCERIN  (NITROSTAT ) 0.4 MG SL tablet Place 1 tablet (0.4 mg total) under the tongue every 5 (five) minutes as needed for chest pain.   olmesartan  (BENICAR ) 40 MG tablet Take 1 tablet (40 mg total) by mouth daily.   pantoprazole  (PROTONIX ) 40 MG tablet Take 1 tablet (40 mg total) by mouth daily.   potassium chloride  (KLOR-CON ) 10 MEQ tablet Take 1 tablet (10 mEq total) by mouth daily.   rosuvastatin  (CRESTOR ) 20 MG tablet Take 1 tablet (20 mg total) by mouth at bedtime.   sertraline  (ZOLOFT ) 50 MG tablet Take 1 tablet (50 mg total) by mouth daily.   spironolactone  (ALDACTONE ) 25 MG tablet Take 1 tablet (25 mg total) by mouth once daily   vitamin B-12 (CYANOCOBALAMIN ) 500 MCG tablet Take 500 mcg by mouth daily.   zinc  sulfate 220 (50 Zn) MG capsule Take 1 capsule (220 mg total) by mouth daily.   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  He has one son that lives with him and one daughter that lives beside of him. He is close with his family.  His sister cooks meals for him regularly.  He has friends at the cafe he visits regularly.   Social Drivers of Corporate Investment Banker Strain: Low Risk  (03/07/2023)   Overall Financial Resource Strain (CARDIA)    Difficulty of Paying Living Expenses: Not hard at all  Food Insecurity: No Food Insecurity (03/07/2023)   Hunger Vital Sign    Worried About Running Out of Food in the Last Year: Never true    Ran Out of Food in the Last Year: Never true  Transportation Needs: No Transportation Needs (03/07/2023)   PRAPARE - Administrator, Civil Service (Medical): No    Lack of Transportation (Non-Medical): No  Physical Activity: Insufficiently Active (03/07/2023)   Exercise Vital Sign    Days of Exercise per Week: 2 days    Minutes of Exercise per Session: 10 min  Stress: No Stress Concern Present (03/07/2023)   [employer] of Occupational Health - Occupational Stress Questionnaire    Feeling of Stress : Not at all  Social Connections: Moderately Isolated (03/07/2023)   Social Connection and Isolation Panel [NHANES]    Frequency of Communication with Friends and Family: More than three times a week    Frequency of Social Gatherings with Friends and Family: More than three times a week    Attends Religious Services: 1 to 4 times per year    Active Member of [employer] or Organizations: No    Attends Banker Meetings: Never    Marital Status: Widowed     Family History: The patient's family history includes Arthritis in his brother and father; Cancer in his  brother and mother; Diabetes in his mother; Hypertension in his father; Kidney disease in his mother; Migraines in his father; Sleep disorder in his father and mother; Stroke in his mother. ROS:   Please see the history of present illness.    All 14 point review of systems negative except as described per history of present illness  EKGs/Labs/Other Studies Reviewed:         Recent Labs: 11/30/2022: NT-Pro BNP 3,520 03/29/2023: ALT 16; BUN 22; Creatinine, Ser 1.23; Hemoglobin 12.6; Platelets 388; Potassium 3.7; Sodium 141  Recent Lipid Panel    Component Value Date/Time   CHOL 140 12/26/2022 1211   TRIG 175 (H) 12/26/2022 1211   HDL 28 (L) 12/26/2022 1211   CHOLHDL 5.0 12/26/2022 1211   LDLCALC 82 12/26/2022 1211    Physical Exam:    VS:  BP (!) 118/4 (BP Location: Right Arm, Patient Position: Sitting)   Pulse (!) 1   Ht 5' 5 (1.651 m)   Wt 152 lb 9.6 oz (69.2 kg)   SpO2 94%   BMI 25.39 kg/m     Wt Readings from Last 3 Encounters:  04/04/23 152 lb 9.6 oz (69.2 kg)  03/29/23 145 lb (65.8 kg)  03/07/23 145 lb (65.8 kg)     GEN:  Well nourished, well developed in no acute distress HEENT: Normal NECK: No JVD; No carotid bruits LYMPHATICS: No lymphadenopathy CARDIAC: Irregularly irregular, no murmurs, no rubs, no gallops RESPIRATORY:  Clear to auscultation without rales, wheezing or rhonchi  ABDOMEN: Soft, non-tender, non-distended MUSCULOSKELETAL:  No edema; No deformity  SKIN: Warm and dry LOWER EXTREMITIES: no  swelling NEUROLOGIC:  Alert and oriented x 3 PSYCHIATRIC:  Normal affect   ASSESSMENT:    1. Acute on chronic diastolic congestive heart failure (HCC)   2. Coronary artery disease involving native coronary artery of native heart without angina pectoris   3. Persistent atrial fibrillation (HCC)   4. History of CVA (cerebrovascular accident)   5. Status post coronary artery bypass graft    PLAN:    In order of problems listed above:  Persistent atrial  fibrillation.  I had big reservation about putting him on anticoagulation because of unsteady gait frequent falls however now with recent TIA the same asked so he is taking 2.5 mg of Eliquis  twice daily, he is 89 years old however he weighs more than 60 kg and creatinine is 1.23 but still I will continue only 2.5 twice daily and to simply reluctant to push full dose of this medication on him.  I will schedule him to see neurology. Coronary disease stable from that point review denies have any issues. Status post aortic valve replacement doing well   Medication Adjustments/Labs and Tests Ordered: Current medicines are reviewed at length with the patient today.  Concerns regarding medicines are outlined above.  No orders of the defined types were placed in this encounter.  Medication changes: No orders of the defined types were placed in this encounter.   Signed, Lamar DOROTHA Fitch, MD, [facility] 04/04/2023 11:25 AM    [facility]

## 2023-04-04 NOTE — Patient Instructions (Signed)

## 2023-04-29 ENCOUNTER — Ambulatory Visit (INDEPENDENT_AMBULATORY_CARE_PROVIDER_SITE_OTHER): Payer: HMO | Admitting: Physician Assistant

## 2023-04-29 ENCOUNTER — Encounter: Payer: Self-pay | Admitting: Physician Assistant

## 2023-04-29 ENCOUNTER — Ambulatory Visit: Payer: HMO | Admitting: Family Medicine

## 2023-04-29 VITALS — BP 118/78 | HR 63 | Temp 97.1°F | Ht 65.0 in | Wt 158.0 lb

## 2023-04-29 DIAGNOSIS — G6289 Other specified polyneuropathies: Secondary | ICD-10-CM

## 2023-04-29 DIAGNOSIS — G4709 Other insomnia: Secondary | ICD-10-CM | POA: Diagnosis not present

## 2023-04-29 DIAGNOSIS — J028 Acute pharyngitis due to other specified organisms: Secondary | ICD-10-CM | POA: Diagnosis not present

## 2023-04-29 DIAGNOSIS — I110 Hypertensive heart disease with heart failure: Secondary | ICD-10-CM

## 2023-04-29 DIAGNOSIS — K5904 Chronic idiopathic constipation: Secondary | ICD-10-CM

## 2023-04-29 DIAGNOSIS — M7552 Bursitis of left shoulder: Secondary | ICD-10-CM | POA: Diagnosis not present

## 2023-04-29 MED ORDER — AZITHROMYCIN 250 MG PO TABS: ORAL_TABLET | ORAL | 0 refills | Status: AC

## 2023-04-29 MED ORDER — GABAPENTIN 300 MG PO CAPS: 300.0000 mg | ORAL_CAPSULE | Freq: Three times a day (TID) | ORAL | 0 refills | Status: DC

## 2023-04-29 NOTE — Progress Notes (Addendum)
Acute Office Visit  Subjective:    Patient ID: Jack Hodges, male    DOB: [birth year], 89 y.o.   MRN: [id number]  Chief Complaint  Patient presents with   Cough/Congestion    X2 weeks    Discussed the use of AI scribe software for clinical note transcription with the patient, who gave verbal consent to proceed.  HPI: Patient is in today for chronic cough Discussed the use of AI scribe software for clinical note transcription with the patient, who gave verbal consent to proceed.  History of Present Illness   Jack Hodges, a 89 year old with a history of heart valve replacement, presents with a persistent cough of two weeks duration. The cough is productive with clear sputum and is worse at night. The patient also reports difficulty sleeping due to the cough. He denies any sinus pressure, ear pain, or runny nose. He also denies any acid reflux or burning sensation in the esophagus.  In addition to the cough, the patient reports occasional constipation, with bowel movements sometimes not occurring for three days. He denies any blood in the stool. He does not take any fiber supplements and his diet is not rich in fiber.  The patient also reports a history of vertigo, describing a sensation of the bed rising. He has seen a specialist for this issue. He also reports numbness in the hands and toes, which he manages with gabapentin.  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 He reports difficulty raising his arm due to the arthritis.  The patient also reports a history of leg swelling, for which he takes diuretics. He reports that the swelling has improved.  The patient denies any allergies to medications.       Past Medical History:  Diagnosis Date   Acute bursitis of left shoulder 01/04/2020   Acute on chronic diastolic congestive heart failure (HCC) 11/13/2021   Last Assessment & Plan:  Formatting of this note is different  from the original. Pertinent Data:   Current medication includes: furosemide - 20 mg lisinopriL - 10 mg metoprolol succinate - 25 mg.  BNP (pg/mL)  Date Value  11/15/2021 433.6 (H)   eGFR (mL/min/1.81m*2)  Date Value  11/15/2021 >60.0    BP Readings from Last 3 Encounters:  11/30/21 (!) 147/82  11/15/21 (!) 109/54  11/13/21 (!) 134/74     Acute respiratory failure with hypoxia (HCC) 03/17/2020   Angina pectoris (HCC) 10/07/2019   Arthritis    Atherosclerosis of aorta (HCC) 09/24/2019   Bilateral foot-drop 01/18/2017   BMI 27.0-27.9,adult 09/24/2019   Coronary artery disease involving native coronary artery of native heart without angina pectoris 11/01/2015   Overview:  Bypass surgery in 1996 Overview:  Overview:  Bypass surgery in 1996  Last Assessment & Plan:  Continue his current regimen.  Follow up as noted.   CVA (cerebral vascular accident) (HCC) 04/03/2016   Last Assessment & Plan:  The patient underwent extensive CVA workup.  MRI is negative.  He does have aortic stenosis by echocardiogram.  Upon review of Care everywhere he is followed by Dr. Bing Matter, cardiologist for this.  Carotid ultrasound negative for hemodynamically significant stenosis.  Lipid panel within normal limits.  He has not had any further episodes of dizziness or nausea vomiting.    Demand ischemia (HCC) 11/14/2021   Last Assessment & Plan:  Formatting of this note might be different from the original. Troponin flat No anginal symptoms  Formatting of this note might be different from the original. Last Assessment & Plan:  Formatting of this note might be different from the original. Troponin flat No anginal symptoms   Dementia (HCC)    Depression    DNR (do not resuscitate) 04/03/2016   Overview:  I had an extensive discussion with the patient on 04/03/2016 regarding his end of life wishes.  He and his daughter agree that do not resuscitate is in keeping with his beliefs.   Dyslipidemia 11/01/2015   Last Assessment & Plan:   Formatting of this note might be different from the original. Cholesterol 136   Triglycerides  83  HDL  50.1  LDL Calculated  69  VLDL Cholesterol Cal  17   Continue statin.   Dyspnea    with activity   Essential hypertension 11/01/2015   Last Assessment & Plan:  Resume home medications.  Follow-up as noted above.   Generalized weakness 03/13/2020   GERD (gastroesophageal reflux disease)    Herpes zoster 05/19/2019   History of aortic stenosis 03/11/2020   History of CVA (cerebrovascular accident) 11/14/2021   Last Assessment & Plan:  Formatting of this note might be different from the original. Neurologically intact upon assessment -continue aspirin, statin   Hypertensive heart disease with heart failure (HCC) 11/01/2015   Last Assessment & Plan:  Resume home medications.  Follow-up as noted above.   Myocardial infarction (HCC)    approx 1994   Neuropathy    Peripheral neuropathy 11/14/2021   Last Assessment & Plan:  Formatting of this note might be different from the original. Bilateral lower extremity cool to touch with diminished sensation Patient reports findings are chronic Continue home dose Lyrica Formatting of this note might be different from the original. Last Assessment & Plan:  Formatting of this note might be different from the original. 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   Thrombocytopenia (HCC) 03/13/2020   Trochanteric bursitis of left hip 01/19/2021    Past Surgical History:  Procedure Laterality Date   CARDIAC  CATHETERIZATION     CARDIAC SURGERY     CATARACT EXTRACTION, BILATERAL     CORONARY ARTERY BYPASS GRAFT     CORONARY STENT INTERVENTION N/A 10/07/2019   Procedure: CORONARY STENT INTERVENTION;  Surgeon: Tonny Bollman, MD;  [facility] INVAS[facility] CV LAB;  Service: Cardiovascular;  Laterality: N/A;   EYE SURGERY     HEMORROIDECTOMY     RIGHT/LEFT HEART CATH AND CORONARY/GRAFT ANGIOGRAPHY N/A 10/07/2019   Procedure: RIGHT/LEFT HEART CATH AND CORONARY/GRAFT ANGIOGRAPHY;  Surgeon: Tonny Bollman, MD;  Location: [facility] INVASIVE CV LAB;  Service: Cardiovascular;  Laterality: N/A;   TEE WITHOUT CARDIOVERSION N/A 10/27/2019   Procedure: TRANSESOPHAGEAL ECHOCARDIOGRAM (TEE);  Surgeon: Kathleene Hazel, MD;  Location: [facility] INVASIVE CV LAB;  Service: Open Heart Surgery;  Laterality: N/A;   TRANSCATHETER AORTIC VALVE REPLACEMENT, TRANSFEMORAL N/A 10/27/2019   Procedure: TRANSCATHETER AORTIC VALVE REPLACEMENT, TRANSFEMORAL;  Surgeon: Kathleene Hazel, MD;  Location: [facility] INVASIVE CV LAB;  Service: Open Heart Surgery;  Laterality: N/A;  Family History  Problem Relation Age of Onset   Cancer Mother    Diabetes Mother    Kidney disease Mother    Stroke Mother    Sleep disorder Mother    Arthritis Father    Migraines Father    Hypertension Father    Sleep disorder Father    Arthritis Brother    Cancer Brother     Social History   Socioeconomic History   Marital status: Widowed    Spouse name: Dorann Lodge   Number of children: 2   Years of education: Not on file   Highest education level: Not on file  Occupational History   Occupation: Retired    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  He has one son that lives with him and one daughter that lives beside of him. He is close with his family.  His sister cooks meals for him regularly.  He has friends at the cafe he visits regularly.   Social Drivers of Corporate investment banker Strain: Low Risk  (03/07/2023)   Overall Financial Resource Strain (CARDIA)    Difficulty of Paying Living Expenses: Not hard at all  Food Insecurity: No Food Insecurity (03/07/2023)   Hunger Vital Sign    Worried About Running Out of Food in the Last Year: Never true    Ran Out of Food in the Last Year: Never true  Transportation Needs: No Transportation Needs (03/07/2023)   PRAPARE - Administrator, Civil Service (Medical): No    Lack of Transportation (Non-Medical): No  Physical Activity: Insufficiently Active (03/07/2023)   Exercise Vital Sign    Days of Exercise per Week: 2 days    Minutes of Exercise per Session: 10 min  Stress: No Stress Concern Present (03/07/2023)   [employer] of Occupational Health - Occupational Stress Questionnaire    Feeling of Stress : Not at all  Social Connections: Moderately Isolated (03/07/2023)   Social Connection and Isolation Panel [NHANES]    Frequency of Communication with Friends and Family: More than three times a week    Frequency of Social Gatherings with Friends and Family: More than three times a week    Attends Religious Services: 1 to 4 times per year    Active Member of [employer] or Organizations: No    Attends Banker Meetings: Never    Marital Status: Widowed  Intimate Partner Violence: Not At Risk (03/07/2023)   Humiliation, Afraid, Rape, and Kick questionnaire    Fear of Current or Ex-Partner: No    Emotionally Abused: No    Physically Abused: No    Sexually Abused: No    Outpatient Medications Prior to Visit   Medication Sig Dispense Refill   acetaminophen (TYLENOL) 650 MG CR tablet Take 650 mg by mouth every 8 (eight) hours as needed for pain.      albuterol (VENTOLIN HFA) 108 (90 Base) MCG/ACT inhaler Inhale 2 puffs into  the lungs every 2 (two) hours as needed for wheezing or shortness of breath. 6.7 g 6   amLODipine (NORVASC) 5 MG tablet Take 5 mg by mouth daily.     ammonium lactate (LAC-HYDRIN) 12 % lotion Apply 1 Application topically 2 (two) times daily. Apply twice daily to arms and hands to increase moisture and reduce easy bruising.  Can also be used safely on neck, chest, and legs as needed 400 g 0   apixaban (ELIQUIS) 2.5 MG TABS tablet Take 1 tablet (2.5 mg total) by mouth 2 (two) times daily. 60 tablet 0   ascorbic acid (VITAMIN C) 500 MG tablet Take 1 tablet (500 mg total) by mouth daily. 30 tablet 0   aspirin EC 81 MG tablet Take 1 tablet (81 mg total) by mouth daily. Swallow whole. 90 tablet 1   carbidopa-levodopa (SINEMET) 25-100 MG tablet Take 1 tablet by mouth at bedtime as needed (rESTLESS LEGS.). 90 tablet 1   carvedilol (COREG) 25 MG tablet Take 1 tablet (25 mg total) by mouth 2 (two) times daily. 60 tablet 3   cholecalciferol (VITAMIN D3) 25 MCG (1000 UNIT) tablet Take 1 tablet (1,000 Units total) by mouth daily. 90 tablet 1   ezetimibe (ZETIA) 10 MG tablet Take 1 tablet (10 mg total) by mouth daily. 90 tablet 1   fluorometholone (FML) 0.1 % ophthalmic suspension Place 1 drop into both eyes daily.     meclizine (ANTIVERT) 25 MG tablet Take 25 mg by mouth every 6 (six) hours.     neomycin-polymyxin b-dexamethasone (MAXITROL) 3.5-10000-0.1 SUSP Place 1 drop into both eyes every 6 (six) hours.     olmesartan (BENICAR) 40 MG tablet Take 1 tablet (40 mg total) by mouth daily. 90 tablet 1   pantoprazole (PROTONIX) 40 MG tablet Take 1 tablet (40 mg total) by mouth daily. 100 tablet 1   rosuvastatin (CRESTOR) 20 MG tablet Take 1 tablet (20 mg total) by mouth at bedtime. 90 tablet 3    sertraline (ZOLOFT) 50 MG tablet Take 1 tablet (50 mg total) by mouth daily. 90 tablet 1   spironolactone (ALDACTONE) 25 MG tablet Take 1 tablet (25 mg total) by mouth once daily 30 tablet 3   vitamin B-12 (CYANOCOBALAMIN) 500 MCG tablet Take 500 mcg by mouth daily.     zinc sulfate 220 (50 Zn) MG capsule Take 1 capsule (220 mg total) by mouth daily. 90 capsule 1   gabapentin (NEURONTIN) 300 MG capsule Take 300 mg by mouth 3 (three) times daily.     furosemide (LASIX) 80 MG tablet Take 1 tablet (80 mg total) by mouth daily. 90 tablet 3   nitroGLYCERIN (NITROSTAT) 0.4 MG SL tablet Place 1 tablet (0.4 mg total) under the tongue every 5 (five) minutes as needed for chest pain. 25 tablet 2   potassium chloride (KLOR-CON) 10 MEQ tablet Take 1 tablet (10 mEq total) by mouth daily. 90 tablet 3   triamcinolone acetonide (KENALOG-40) injection 60 mg      No facility-administered medications prior to visit.    Allergies  Allergen Reactions   Poison Oak Extract Rash    Review of Systems  Constitutional:  Negative for appetite change, fatigue and fever.  HENT:  Positive for congestion. Negative for ear pain, sinus pressure and sore throat.   Respiratory:  Positive for cough. Negative for chest tightness, shortness of breath and wheezing.   Cardiovascular:  Negative for chest pain and palpitations.  Gastrointestinal:  Negative for abdominal pain, constipation, diarrhea,  nausea and vomiting.  Genitourinary:  Negative for dysuria and hematuria.  Musculoskeletal:  Negative for arthralgias, back pain, joint swelling and myalgias.  Skin:  Negative for rash.  Neurological:  Negative for dizziness, weakness and headaches.  Psychiatric/Behavioral:  Negative for dysphoric mood. The patient is not nervous/anxious.        Objective:        04/29/2023    2:11 PM 04/04/2023   11:02 AM 03/30/2023    8:58 AM  Vitals with BMI  Height 5\' 5"  5\' 5"    Weight 158 lbs 152 lbs 10 oz   BMI 26.29 25.39   Systolic  118 118 108  Diastolic 78 4 72  Pulse 63 1 65    Orthostatic VS for the past 72 hrs (Last 3 readings):  Patient Position BP Location  04/29/23 1411 Sitting Left Arm     Physical Exam Vitals reviewed.  Constitutional:      Appearance: Normal appearance.  HENT:     Head: Normocephalic.     Salivary Glands: Right salivary gland is not diffusely enlarged or tender. Left salivary gland is not diffusely enlarged or tender.     Right Ear: Tympanic membrane and ear canal normal.     Left Ear: Tympanic membrane and ear canal normal.     Mouth/Throat:     Mouth: Mucous membranes are moist.     Tongue: No lesions. Tongue does not deviate from midline.     Palate: No mass and lesions.     Pharynx: Oropharynx is clear. Posterior oropharyngeal erythema present.     Tonsils: No tonsillar exudate or tonsillar abscesses.  Neck:     Vascular: No carotid bruit.  Cardiovascular:     Rate and Rhythm: Normal rate. Rhythm irregular.     Heart sounds: Normal heart sounds.  Pulmonary:     Effort: Pulmonary effort is normal.     Breath sounds: Normal breath sounds.  Abdominal:     General: Bowel sounds are normal.     Palpations: Abdomen is soft.     Tenderness: There is no abdominal tenderness.  Neurological:     Mental Status: He is alert and oriented to person, place, and time.  Psychiatric:        Mood and Affect: Mood normal.        Behavior: Behavior normal.     There are no preventive care reminders to display for this patient.  There are no preventive care reminders to display for this patient.   No results found for: "TSH" Lab Results  Component Value Date   WBC 18.4 (H) 03/29/2023   HGB 12.6 (L) 03/29/2023   HCT 38.4 (L) 03/29/2023   MCV 88.3 03/29/2023   PLT 388 03/29/2023   Lab Results  Component Value Date   NA 141 03/29/2023   K 3.7 03/29/2023   CO2 24 03/29/2023   GLUCOSE 108 (H) 03/29/2023   BUN 22 03/29/2023   CREATININE 1.23 03/29/2023   BILITOT 1.4 (H)  03/29/2023   ALKPHOS 88 03/29/2023   AST 18 03/29/2023   ALT 16 03/29/2023   PROT 6.7 03/29/2023   ALBUMIN 3.9 03/29/2023   CALCIUM 9.3 03/29/2023   ANIONGAP 14 03/29/2023   EGFR 60 12/26/2022   Lab Results  Component Value Date   CHOL 140 12/26/2022   Lab Results  Component Value Date   HDL 28 (L) 12/26/2022   Lab Results  Component Value Date   LDLCALC 82 12/26/2022   Lab  Results  Component Value Date   TRIG 175 (H) 12/26/2022   Lab Results  Component Value Date   CHOLHDL 5.0 12/26/2022   Lab Results  Component Value Date   HGBA1C 4.8 10/23/2019       Assessment & Plan:  Acute pharyngitis due to other specified organisms Assessment & Plan: Persistent cough for two weeks with clear sputum, worse at night. No fever, sinus pressure, or ear pain. No signs of lower respiratory involvement on auscultation. -Prescribe Azithromycin (Z-Pak) and advise patient to report if symptoms do not improve.  Orders: -     Azithromycin; Take 2 tablets on day 1, then 1 tablet daily on days 2 through 5  Dispense: 6 tablet; Refill: 0  Other polyneuropathy Assessment & Plan: Continue to monitor symptoms Continue taking Gabapentin 300mg  as directed Will adjust treatment based on symptoms  Orders: -     Gabapentin; Take 1 capsule (300 mg total) by mouth 3 (three) times daily.  Dispense: 90 capsule; Refill: 0  Other insomnia Assessment & Plan: Difficulty sleeping due to cough. -Advise trial of Benadryl at night to aid sleep.   Chronic idiopathic constipation Assessment & Plan: History of hemorrhoid surgery with occasional constipation. No blood in stool. No fiber supplements or high fiber diet. -Advise on the importance of dietary fiber and hydration.   Acute bursitis of left shoulder Assessment & Plan: History of receiving injections for arthritis. Recent difficulty with shoulder mobility. -Consider evaluation for possible arthritis treatment if symptoms persist or  worsen.   Hypertensive heart disease with heart failure (HCC) Assessment & Plan: History of vertigo with no current treatment. -Continue monitoring. If symptoms worsen, consider referral to a specialist.      Meds ordered this encounter  Medications   gabapentin (NEURONTIN) 300 MG capsule    Sig: Take 1 capsule (300 mg total) by mouth 3 (three) times daily.    Dispense:  90 capsule    Refill:  0   azithromycin (ZITHROMAX) 250 MG tablet    Sig: Take 2 tablets on day 1, then 1 tablet daily on days 2 through 5    Dispense:  6 tablet    Refill:  0    No orders of the defined types were placed in this encounter.    Follow-up: No follow-ups on file.  An After Visit Summary was printed and given to the patient.  Follow-up -Check response to Azithromycin treatment. -Consider chest x-ray if cough persists.         I,Lauren M Auman,acting as a Neurosurgeon for [employer], [employer].,have documented all relevant documentation on the behalf of Langley Gauss, PA,as directed by  Langley Gauss, PA while in the presence of Langley Gauss, [place].   Langley Gauss, [place] [facility] [phone]

## 2023-05-01 DIAGNOSIS — K5900 Constipation, unspecified: Secondary | ICD-10-CM

## 2023-05-01 DIAGNOSIS — G4700 Insomnia, unspecified: Secondary | ICD-10-CM | POA: Insufficient documentation

## 2023-05-01 HISTORY — DX: Insomnia, unspecified: G47.00

## 2023-05-01 HISTORY — DX: Constipation, unspecified: K59.00

## 2023-05-01 NOTE — Assessment & Plan Note (Signed)
 Difficulty sleeping due to cough. -Advise trial of Benadryl at night to aid sleep.

## 2023-05-01 NOTE — Assessment & Plan Note (Signed)
 Persistent cough for two weeks with clear sputum, worse at night. No fever, sinus pressure, or ear pain. No signs of lower respiratory involvement on auscultation. -Prescribe Azithromycin  (Z-Pak) and advise patient to report if symptoms do not improve.

## 2023-05-01 NOTE — Assessment & Plan Note (Signed)
 History of receiving injections for arthritis. Recent difficulty with shoulder mobility. -Consider evaluation for possible arthritis treatment if symptoms persist or worsen.

## 2023-05-01 NOTE — Assessment & Plan Note (Signed)
 Continue to monitor symptoms Continue taking Gabapentin  300mg  as directed Will adjust treatment based on symptoms

## 2023-05-01 NOTE — Assessment & Plan Note (Signed)
 History of vertigo with no current treatment. -Continue monitoring. If symptoms worsen, consider referral to a specialist.

## 2023-05-01 NOTE — Assessment & Plan Note (Signed)
 History of hemorrhoid surgery with occasional constipation. No blood in stool. No fiber supplements or high fiber diet. -Advise on the importance of dietary fiber and hydration.

## 2023-05-08 ENCOUNTER — Encounter: Payer: Self-pay | Admitting: Physician Assistant

## 2023-05-08 ENCOUNTER — Ambulatory Visit: Payer: HMO | Admitting: Physician Assistant

## 2023-05-08 VITALS — BP 102/60 | HR 62 | Temp 97.6°F | Resp 16 | Ht 65.0 in | Wt 157.2 lb

## 2023-05-08 DIAGNOSIS — Z8673 Personal history of transient ischemic attack (TIA), and cerebral infarction without residual deficits: Secondary | ICD-10-CM | POA: Diagnosis not present

## 2023-05-08 DIAGNOSIS — I700 Atherosclerosis of aorta: Secondary | ICD-10-CM

## 2023-05-08 DIAGNOSIS — M19019 Primary osteoarthritis, unspecified shoulder: Secondary | ICD-10-CM | POA: Diagnosis not present

## 2023-05-08 DIAGNOSIS — M1990 Unspecified osteoarthritis, unspecified site: Secondary | ICD-10-CM

## 2023-05-08 DIAGNOSIS — R6889 Other general symptoms and signs: Secondary | ICD-10-CM | POA: Diagnosis not present

## 2023-05-08 DIAGNOSIS — I110 Hypertensive heart disease with heart failure: Secondary | ICD-10-CM

## 2023-05-08 DIAGNOSIS — I509 Heart failure, unspecified: Secondary | ICD-10-CM

## 2023-05-08 DIAGNOSIS — Z8669 Personal history of other diseases of the nervous system and sense organs: Secondary | ICD-10-CM | POA: Diagnosis not present

## 2023-05-08 DIAGNOSIS — Z1152 Encounter for screening for COVID-19: Secondary | ICD-10-CM | POA: Insufficient documentation

## 2023-05-08 DIAGNOSIS — R42 Dizziness and giddiness: Secondary | ICD-10-CM

## 2023-05-08 HISTORY — DX: Personal history of other diseases of the nervous system and sense organs: Z86.69

## 2023-05-08 LAB — POCT INFLUENZA A/B
Influenza A, POC: NEGATIVE
Influenza B, POC: NEGATIVE

## 2023-05-08 LAB — POC COVID19 BINAXNOW: SARS Coronavirus 2 Ag: NEGATIVE

## 2023-05-08 MED ORDER — TRIAMCINOLONE ACETONIDE 40 MG/ML IJ SUSP
40.0000 mg | Freq: Once | INTRAMUSCULAR | Status: AC
  Administered 2023-05-08: 40 mg via INTRAMUSCULAR

## 2023-05-08 NOTE — Assessment & Plan Note (Addendum)
Worsening symptoms, no recent falls. Meclizine ineffective. History of double vision and stroke. -Refer to neurology for further evaluation and management.  Dehydration Reports inadequate fluid intake, possibly contributing to vertigo symptoms. -Increase water intake to at least three to four 12-ounce bottles daily.

## 2023-05-08 NOTE — Assessment & Plan Note (Signed)
Controlled Continue with current treatment Will adjust treatment as needed based on BP and labwork

## 2023-05-08 NOTE — Assessment & Plan Note (Signed)
Labs dawn today Continue to monitor for side effects  Continue taking Crestor 20mg , Zetia 10mg , and fenofibrate 160mg  Will adjust treatment depending on labs Lab Results  Component Value Date   LDLCALC 82 12/26/2022

## 2023-05-08 NOTE — Assessment & Plan Note (Signed)
Widespread joint pain, particularly in shoulders and legs. Limited range of motion in arms. Pain exacerbated by physical therapy exercises. Previous cortisone injections provided temporary relief. -Administer cortisone injection in right shoulder today. -Consider further injections as needed for pain management.

## 2023-05-08 NOTE — Progress Notes (Signed)
Subjective:  Patient ID: Jack Hodges, male    DOB: 1931-03-20  Age: 89 y.o. MRN: [id number]  Chief Complaint  Patient presents with   Medical Management of Chronic Issues   Dizziness   Fatigue   Nasal Congestion    Dizziness Associated symptoms include congestion, coughing and fatigue.    Patient requests a Flu test. Discussed the use of AI scribe software for clinical note transcription with the patient, who gave verbal consent to proceed.  History of Present Illness   The patient, with a history of stroke, double vision, and a valve replacement, presents with worsening vertigo. He describes the sensation as his "head goes around and around" and it feels like the bed is moving when he lies down. He denies any recent falls due to the vertigo. He has tried meclizine for the vertigo, but it did not provide any relief.  The patient also reports generalized body aches, particularly in the shoulders and legs. He describes difficulty raising his arms and soreness in his legs that makes walking difficult. He has tried exercises recommended by physical therapists, but these seem to exacerbate the pain. He has received a cortisone injection in the shoulder, which provided relief for about two weeks.  The patient also reports a dry mouth, which he attributes to his fluid pill. He admits to not drinking a lot of water daily, which could be contributing to the dry mouth and possibly the vertigo.          03/07/2023    1:07 PM 12/26/2022   11:30 AM 01/24/2022    8:26 PM 10/11/2021    8:43 AM 08/01/2021    2:17 PM  Depression screen PHQ 2/9  Decreased Interest 0 0 2 2 0  Down, Depressed, Hopeless 0 0 2 2 0  PHQ - 2 Score 0 0 4 4 0  Altered sleeping 0  2 3   Tired, decreased energy 0  1 2   Change in appetite 3  2 2    Feeling bad or failure about yourself  0  1 3   Trouble concentrating 0  0 0   Moving slowly or fidgety/restless 0  0 0   Suicidal thoughts 0  0 0   PHQ-9 Score 3  10 14     Difficult doing work/chores Not difficult at all  Not difficult at all Somewhat difficult         03/07/2023    1:07 PM  Fall Risk   Falls in the past year? 0  Number falls in past yr: 0  Injury with Fall? 0  Risk for fall due to : No Fall Risks  Follow up Falls prevention discussed    Patient Care Team: Langley Gauss, [place] as PCP - General (Physician Assistant) Georgeanna Lea, MD as PCP - Cardiology (Cardiology) Zettie Pho, [facility] (Inactive) as Pharmacist (Pharmacist)   Review of Systems  Constitutional:  Positive for fatigue.  HENT:  Positive for congestion.   Eyes: Negative.   Respiratory:  Positive for cough and wheezing.   Cardiovascular: Negative.   Gastrointestinal: Negative.   Endocrine: Negative.   Genitourinary: Negative.   Musculoskeletal: Negative.   Skin: Negative.   Allergic/Immunologic: Negative.   Neurological:  Positive for dizziness.  Hematological: Negative.   Psychiatric/Behavioral: Negative.      Current Outpatient Medications on File Prior to Visit  Medication Sig Dispense Refill   acetaminophen (TYLENOL) 650 MG CR tablet Take 650 mg by mouth every 8 (eight)  hours as needed for pain.      albuterol (VENTOLIN HFA) 108 (90 Base) MCG/ACT inhaler Inhale 2 puffs into the lungs every 2 (two) hours as needed for wheezing or shortness of breath. 6.7 g 6   amLODipine (NORVASC) 5 MG tablet Take 5 mg by mouth daily.     ammonium lactate (LAC-HYDRIN) 12 % lotion Apply 1 Application topically 2 (two) times daily. Apply twice daily to arms and hands to increase moisture and reduce easy bruising.  Can also be used safely on neck, chest, and legs as needed 400 g 0   ascorbic acid (VITAMIN C) 500 MG tablet Take 1 tablet (500 mg total) by mouth daily. 30 tablet 0   aspirin EC 81 MG tablet Take 1 tablet (81 mg total) by mouth daily. Swallow whole. 90 tablet 1   carbidopa-levodopa (SINEMET) 25-100 MG tablet Take 1 tablet by mouth at bedtime as needed (rESTLESS  LEGS.). 90 tablet 1   carvedilol (COREG) 25 MG tablet Take 1 tablet (25 mg total) by mouth 2 (two) times daily. 60 tablet 3   cholecalciferol (VITAMIN D3) 25 MCG (1000 UNIT) tablet Take 1 tablet (1,000 Units total) by mouth daily. 90 tablet 1   ezetimibe (ZETIA) 10 MG tablet Take 1 tablet (10 mg total) by mouth daily. 90 tablet 1   fluorometholone (FML) 0.1 % ophthalmic suspension Place 1 drop into both eyes daily.     gabapentin (NEURONTIN) 300 MG capsule Take 1 capsule (300 mg total) by mouth 3 (three) times daily. 90 capsule 0   meclizine (ANTIVERT) 25 MG tablet Take 25 mg by mouth every 6 (six) hours.     neomycin-polymyxin b-dexamethasone (MAXITROL) 3.5-10000-0.1 SUSP Place 1 drop into both eyes every 6 (six) hours.     olmesartan (BENICAR) 40 MG tablet Take 1 tablet (40 mg total) by mouth daily. 90 tablet 1   pantoprazole (PROTONIX) 40 MG tablet Take 1 tablet (40 mg total) by mouth daily. 100 tablet 1   rosuvastatin (CRESTOR) 20 MG tablet Take 1 tablet (20 mg total) by mouth at bedtime. 90 tablet 3   sertraline (ZOLOFT) 50 MG tablet Take 1 tablet (50 mg total) by mouth daily. 90 tablet 1   spironolactone (ALDACTONE) 25 MG tablet Take 1 tablet (25 mg total) by mouth once daily 30 tablet 3   vitamin B-12 (CYANOCOBALAMIN) 500 MCG tablet Take 500 mcg by mouth daily.     zinc sulfate 220 (50 Zn) MG capsule Take 1 capsule (220 mg total) by mouth daily. 90 capsule 1   furosemide (LASIX) 80 MG tablet Take 1 tablet (80 mg total) by mouth daily. 90 tablet 3   nitroGLYCERIN (NITROSTAT) 0.4 MG SL tablet Place 1 tablet (0.4 mg total) under the tongue every 5 (five) minutes as needed for chest pain. 25 tablet 2   potassium chloride (KLOR-CON) 10 MEQ tablet Take 1 tablet (10 mEq total) by mouth daily. 90 tablet 3   [DISCONTINUED] QUEtiapine (SEROQUEL) 25 MG tablet Take 1 tablet (25 mg total) by mouth at bedtime. 15 tablet 10   No current facility-administered medications on file prior to visit.   Past  Medical History:  Diagnosis Date   Acute bursitis of left shoulder 01/04/2020   Acute on chronic diastolic congestive heart failure (HCC) 11/13/2021   Last Assessment & Plan:  Formatting of this note is different from the original. Pertinent Data:   Current medication includes: furosemide - 20 mg lisinopriL - 10 mg metoprolol succinate - 25  mg.  BNP (pg/mL)  Date Value  11/15/2021 433.6 (H)   eGFR (mL/min/1.23m*2)  Date Value  11/15/2021 >60.0    BP Readings from Last 3 Encounters:  11/30/21 (!) 147/82  11/15/21 (!) 109/54  11/13/21 (!) 134/74     Acute respiratory failure with hypoxia (HCC) 03/17/2020   Angina pectoris (HCC) 10/07/2019   Arthritis    Atherosclerosis of aorta (HCC) 09/24/2019   Bilateral foot-drop 01/18/2017   BMI 27.0-27.9,adult 09/24/2019   Coronary artery disease involving native coronary artery of native heart without angina pectoris 11/01/2015   Overview:  Bypass surgery in 1996 Overview:  Overview:  Bypass surgery in 1996  Last Assessment & Plan:  Continue his current regimen.  Follow up as noted.   CVA (cerebral vascular accident) (HCC) 04/03/2016   Last Assessment & Plan:  The patient underwent extensive CVA workup.  MRI is negative.  He does have aortic stenosis by echocardiogram.  Upon review of Care everywhere he is followed by Dr. Bing Matter, cardiologist for this.  Carotid ultrasound negative for hemodynamically significant stenosis.  Lipid panel within normal limits.  He has not had any further episodes of dizziness or nausea vomiting.    Demand ischemia (HCC) 11/14/2021   Last Assessment & Plan:  Formatting of this note might be different from the original. Troponin flat No anginal symptoms Formatting of this note might be different from the original. Last Assessment & Plan:  Formatting of this note might be different from the original. Troponin flat No anginal symptoms   Dementia (HCC)    Depression    DNR (do not resuscitate) 04/03/2016   Overview:  I had an  extensive discussion with the patient on 04/03/2016 regarding his end of life wishes.  He and his daughter agree that do not resuscitate is in keeping with his beliefs.   Dyslipidemia 11/01/2015   Last Assessment & Plan:  Formatting of this note might be different from the original. Cholesterol 136   Triglycerides  83  HDL  50.1  LDL Calculated  69  VLDL Cholesterol Cal  17   Continue statin.   Dyspnea    with activity   Essential hypertension 11/01/2015   Last Assessment & Plan:  Resume home medications.  Follow-up as noted above.   Generalized weakness 03/13/2020   GERD (gastroesophageal reflux disease)    Herpes zoster 05/19/2019   History of aortic stenosis 03/11/2020   History of CVA (cerebrovascular accident) 11/14/2021   Last Assessment & Plan:  Formatting of this note might be different from the original. Neurologically intact upon assessment -continue aspirin, statin   Hypertensive heart disease with heart failure (HCC) 11/01/2015   Last Assessment & Plan:  Resume home medications.  Follow-up as noted above.   Myocardial infarction (HCC)    approx 1994   Neuropathy    Peripheral neuropathy 11/14/2021   Last Assessment & Plan:  Formatting of this note might be different from the original. Bilateral lower extremity cool to touch with diminished sensation Patient reports findings are chronic Continue home dose Lyrica Formatting of this note might be different from the original. Last Assessment & Plan:  Formatting of this note might be different from the original. 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   Thrombocytopenia (HCC) 03/13/2020   Trochanteric bursitis of left hip 01/19/2021   Past Surgical History:  Procedure Laterality Date   CARDIAC CATHETERIZATION     CARDIAC SURGERY     CATARACT EXTRACTION, BILATERAL     CORONARY ARTERY BYPASS GRAFT     CORONARY STENT INTERVENTION N/A 10/07/2019   Procedure: CORONARY STENT INTERVENTION;  Surgeon: Tonny Bollman, MD;  Location: [facility] INVASIVE CV LAB;  Service: Cardiovascular;  Laterality: N/A;   EYE SURGERY     HEMORROIDECTOMY     RIGHT/LEFT HEART CATH AND CORONARY/GRAFT ANGIOGRAPHY N/A 10/07/2019   Procedure: RIGHT/LEFT HEART CATH AND CORONARY/GRAFT ANGIOGRAPHY;  Surgeon: Tonny Bollman, MD;  Location: [facility] INVASIVE CV LAB;  Service: Cardiovascular;  Laterality: N/A;   TEE WITHOUT CARDIOVERSION N/A 10/27/2019   Procedure: TRANSESOPHAGEAL ECHOCARDIOGRAM (TEE);  Surgeon: Kathleene Hazel, MD;  Location: [facility] INVASIVE CV LAB;  Service: Open Heart Surgery;  Laterality: N/A;   TRANSCATHETER AORTIC VALVE REPLACEMENT, TRANSFEMORAL N/A 10/27/2019   Procedure: TRANSCATHETER AORTIC VALVE REPLACEMENT, TRANSFEMORAL;  Surgeon: Kathleene Hazel, MD;  Location: [facility] INVASIVE CV LAB;  Service: Open Heart Surgery;  Laterality: N/A;    Family History  Problem Relation Age of Onset   Cancer Mother    Diabetes Mother    Kidney disease Mother    Stroke Mother    Sleep disorder Mother    Arthritis Father    Migraines Father    Hypertension Father    Sleep disorder Father    Arthritis Brother    Cancer Brother    Social History   Socioeconomic History   Marital status: Widowed    Spouse name: Dorann Lodge   Number of children: 2   Years of education: Not on file   Highest education level: Not on file  Occupational History   Occupation: Retired    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  He has one son that lives with him and one daughter that lives beside of him. He is close with his family.  His sister cooks meals for him regularly.  He has friends at the cafe he visits regularly.   Social Drivers of Corporate investment banker Strain: Low Risk  (03/07/2023)   Overall Financial Resource Strain (CARDIA)    Difficulty of Paying Living Expenses: Not hard at all  Food Insecurity: No Food Insecurity (03/07/2023)   Hunger Vital Sign    Worried About Running Out of Food in the Last Year: Never true    Ran Out of Food in the Last Year: Never true  Transportation Needs: No Transportation Needs (03/07/2023)   PRAPARE - Administrator, Civil Service (Medical): No    Lack of Transportation (Non-Medical): No  Physical Activity: Insufficiently Active (03/07/2023)  Exercise Vital Sign    Days of Exercise per Week: 2 days    Minutes of Exercise per Session: 10 min  Stress: No Stress Concern Present (03/07/2023)   [employer] of Occupational Health - Occupational Stress Questionnaire    Feeling of Stress : Not at all  Social Connections: Moderately Isolated (03/07/2023)   Social Connection and Isolation Panel [NHANES]    Frequency of Communication with Friends and Family: More than three times a week    Frequency of Social Gatherings with Friends and Family: More than three times a week    Attends Religious Services: 1 to 4 times per year    Active Member of [employer] or Organizations: No    Attends Banker Meetings: Never    Marital Status: Widowed    Objective:  BP 102/60 (BP Location: Right Arm,  Patient Position: Sitting, Cuff Size: Large)   Pulse 62   Temp 97.6 F (36.4 C) (Temporal)   Resp 16   Ht 5\' 5"  (1.651 m)   Wt 157 lb 3.2 oz (71.3 kg)   BMI 26.16 kg/m      05/08/2023    9:40 AM 04/29/2023    2:11 PM 04/04/2023   11:02 AM  BP/Weight  Systolic BP 102 118 118  Diastolic BP 60 78 4  Wt. (Lbs) 157.2 158 152.6  BMI 26.16 kg/m2 26.29 kg/m2 25.39 kg/m2    Physical Exam Vitals reviewed.  Constitutional:      Appearance: Normal appearance.  Cardiovascular:     Rate and Rhythm: Normal rate. Rhythm irregularly irregular.     Heart sounds: Normal heart sounds.  Pulmonary:     Effort: Pulmonary effort is normal.     Breath sounds: Normal breath sounds.  Abdominal:     General: Bowel sounds are normal.     Palpations: Abdomen is soft.     Tenderness: There is no abdominal tenderness.  Neurological:     Mental Status: He is alert and oriented to person, place, and time.  Psychiatric:        Mood and Affect: Mood normal.        Behavior: Behavior normal.     Diabetic Foot Exam - Simple   No data filed      Lab Results  Component Value Date   WBC 18.4 (H) 03/29/2023   HGB 12.6 (L) 03/29/2023   HCT 38.4 (L) 03/29/2023   PLT 388 03/29/2023   GLUCOSE 108 (H) 03/29/2023   CHOL 140 12/26/2022   TRIG 175 (H) 12/26/2022   HDL 28 (L) 12/26/2022   LDLCALC 82 12/26/2022   ALT 16 03/29/2023   AST 18 03/29/2023   NA 141 03/29/2023   K 3.7 03/29/2023   CL 103 03/29/2023   CREATININE 1.23 03/29/2023   BUN 22 03/29/2023   CO2 24 03/29/2023   INR 1.4 (H) 03/29/2023   HGBA1C 4.8 10/23/2019      Assessment & Plan:    Vertigo Assessment & Plan: Worsening symptoms, no recent falls. Meclizine ineffective. History of double vision and stroke. -Refer to neurology for further evaluation and management.  Dehydration Reports inadequate fluid intake, possibly contributing to vertigo symptoms. -Increase water intake to at least three to four 12-ounce bottles  daily.  Orders: -     Ambulatory referral to Neurology  Hypertensive heart disease with heart failure (HCC) Assessment & Plan: Controlled Continue with current treatment Will adjust treatment as needed based on BP and labwork  Orders: -  CMP14+EGFR -     Hemoglobin A1c  Atherosclerosis of aorta (HCC) Assessment & Plan: Labs dawn today Continue to monitor for side effects  Continue taking Crestor 20mg , Zetia 10mg , and fenofibrate 160mg  Will adjust treatment depending on labs Lab Results  Component Value Date   LDLCALC 82 12/26/2022     Orders: -     Lipid panel -     CBC with Differential/Platelet  Arthritis Assessment & Plan: Widespread joint pain, particularly in shoulders and legs. Limited range of motion in arms. Pain exacerbated by physical therapy exercises. Previous cortisone injections provided temporary relief. -Administer cortisone injection in right shoulder today. -Consider further injections as needed for pain management.   Flu-like symptoms -     POCT Influenza A/B -     POC COVID-19 BinaxNow  H/O diplopia -     Ambulatory referral to Neurology  Encounter for screening for COVID-19 -     POC COVID-19 BinaxNow  History of CVA (cerebrovascular accident) -     Ambulatory referral to Neurology     No orders of the defined types were placed in this encounter.   Orders Placed This Encounter  Procedures   Lipid panel   CBC with Differential/Platelet   CMP14+EGFR   Hemoglobin A1c   Ambulatory referral to Neurology   POCT Influenza A/B   POC COVID-19  General Health Maintenance -Continue monitoring blood sugar, kidney function, thyroid function, and PSA levels, all of which are currently within normal limits. -Follow up with Dr. Martha Clan in March for shortness of breath and history of stroke.      Follow-up: Return in about 3 months (around 08/05/2023) for Chronic, fasting.   I,Angela Taylor,acting as a Neurosurgeon for [employer], PA.,have  documented all relevant documentation on the behalf of Langley Gauss, PA,as directed by  Langley Gauss, PA while in the presence of Langley Gauss, [place].   An After Visit Summary was printed and given to the patient.  Langley Gauss, [place] [facility] [phone]

## 2023-05-09 LAB — CBC WITH DIFFERENTIAL/PLATELET
Basophils Absolute: 0.2 10*3/uL (ref 0.0–0.2)
Basos: 1 %
EOS (ABSOLUTE): 0.6 10*3/uL — ABNORMAL HIGH (ref 0.0–0.4)
Eos: 4 %
Hematocrit: 32.2 % — ABNORMAL LOW (ref 37.5–51.0)
Hemoglobin: 10.1 g/dL — ABNORMAL LOW (ref 13.0–17.7)
Immature Grans (Abs): 0.2 10*3/uL — ABNORMAL HIGH (ref 0.0–0.1)
Immature Granulocytes: 2 %
Lymphocytes Absolute: 1 10*3/uL (ref 0.7–3.1)
Lymphs: 7 %
MCH: 28 pg (ref 26.6–33.0)
MCHC: 31.4 g/dL — ABNORMAL LOW (ref 31.5–35.7)
MCV: 89 fL (ref 79–97)
Monocytes Absolute: 0.6 10*3/uL (ref 0.1–0.9)
Monocytes: 4 %
Neutrophils Absolute: 12.9 10*3/uL — ABNORMAL HIGH (ref 1.4–7.0)
Neutrophils: 82 %
Platelets: 328 10*3/uL (ref 150–450)
RBC: 3.61 x10E6/uL — ABNORMAL LOW (ref 4.14–5.80)
RDW: 16.6 % — ABNORMAL HIGH (ref 11.6–15.4)
WBC: 15.4 10*3/uL — ABNORMAL HIGH (ref 3.4–10.8)

## 2023-05-09 LAB — LIPID PANEL
Chol/HDL Ratio: 3.9 {ratio} (ref 0.0–5.0)
Cholesterol, Total: 106 mg/dL (ref 100–199)
HDL: 27 mg/dL — ABNORMAL LOW (ref >39)
LDL Chol Calc (NIH): 58 mg/dL (ref 0–99)
Triglycerides: 112 mg/dL (ref 0–149)
VLDL Cholesterol Cal: 21 mg/dL (ref 5–40)

## 2023-05-09 LAB — CMP14+EGFR
ALT: 10 [IU]/L (ref 0–44)
AST: 14 [IU]/L (ref 0–40)
Albumin: 4 g/dL (ref 3.6–4.6)
Alkaline Phosphatase: 135 [IU]/L — ABNORMAL HIGH (ref 44–121)
BUN/Creatinine Ratio: 17 (ref 10–24)
BUN: 18 mg/dL (ref 10–36)
Bilirubin Total: 1.2 mg/dL (ref 0.0–1.2)
CO2: 20 mmol/L (ref 20–29)
Calcium: 9.1 mg/dL (ref 8.6–10.2)
Chloride: 102 mmol/L (ref 96–106)
Creatinine, Ser: 1.09 mg/dL (ref 0.76–1.27)
Globulin, Total: 2 g/dL (ref 1.5–4.5)
Glucose: 80 mg/dL (ref 70–99)
Potassium: 4.3 mmol/L (ref 3.5–5.2)
Sodium: 141 mmol/L (ref 134–144)
Total Protein: 6 g/dL (ref 6.0–8.5)
eGFR: 64 mL/min/{1.73_m2} (ref >59)

## 2023-05-09 LAB — HEMOGLOBIN A1C
Est. average glucose Bld gHb Est-mCnc: 105 mg/dL
Hgb A1c MFr Bld: 5.3 % (ref 4.8–5.6)

## 2023-05-10 ENCOUNTER — Encounter: Payer: Self-pay | Admitting: Physician Assistant

## 2023-05-17 ENCOUNTER — Telehealth: Payer: Self-pay

## 2023-05-17 MED ORDER — MECLIZINE HCL 25 MG PO TABS: 25.0000 mg | ORAL_TABLET | Freq: Four times a day (QID) | ORAL | 1 refills | Status: DC

## 2023-05-17 NOTE — Telephone Encounter (Signed)
Prescription Request  05/17/2023   What is the name of the medication or equipment?  meclizine (ANTIVERT) 25 MG tablet   Have you contacted your pharmacy to request a refill? No   Which pharmacy would you like this sent to?   [facility] [place] - [place], [place] - [address] [place] [address] Phone: [phone] Fax: [phone]   Patient notified that their request is being sent to the clinical staff for review and that they should receive a response within 2 business days.   Please advise at [facility] [phone]

## 2023-05-28 ENCOUNTER — Ambulatory Visit (INDEPENDENT_AMBULATORY_CARE_PROVIDER_SITE_OTHER)

## 2023-05-28 DIAGNOSIS — R899 Unspecified abnormal finding in specimens from other organs, systems and tissues: Secondary | ICD-10-CM

## 2023-05-30 LAB — POC HEMOCCULT BLD/STL (HOME/3-CARD/SCREEN)
Card #2 Fecal Occult Blod, POC: POSITIVE
Card #3 Fecal Occult Blood, POC: POSITIVE
Fecal Occult Blood, POC: NEGATIVE — AB

## 2023-05-30 NOTE — Progress Notes (Signed)
 Patient is in office today for a nurse visit for  Hemoccult cards . Patient results have been sent to provider.

## 2023-06-03 ENCOUNTER — Encounter: Payer: Self-pay | Admitting: Physician Assistant

## 2023-06-05 ENCOUNTER — Encounter: Payer: Self-pay | Admitting: Physician Assistant

## 2023-06-06 ENCOUNTER — Telehealth: Payer: Self-pay

## 2023-06-06 NOTE — Telephone Encounter (Signed)
 [facility] cert/poc order # [id number]

## 2023-06-09 ENCOUNTER — Other Ambulatory Visit: Payer: Self-pay | Admitting: Physician Assistant

## 2023-06-09 DIAGNOSIS — R296 Repeated falls: Secondary | ICD-10-CM

## 2023-06-09 DIAGNOSIS — K921 Melena: Secondary | ICD-10-CM

## 2023-06-09 DIAGNOSIS — I639 Cerebral infarction, unspecified: Secondary | ICD-10-CM

## 2023-06-10 ENCOUNTER — Ambulatory Visit: Payer: Self-pay | Admitting: Physician Assistant

## 2023-06-10 NOTE — Telephone Encounter (Signed)
 Copied from CRM [id number]. Topic: Clinical - Red Word Triage >> Jun 10, 2023  9:58 AM Fuller Mandril wrote: Red Word that prompted transfer to Nurse Triage: Weak, hurting all over, can't use hands   Chief Complaint: Generalized weakness  Symptoms: Generalized weakness, blood in stool  Frequency: Constant  Disposition: [] ED /[] Urgent Care (no appt availability in office) / [x] Appointment(In office/virtual)/ []  [facility]/ [] Home Care/ [] Refused Recommended Disposition /[] [facility]/ []  Follow-up with PCP Additional Notes: Patient's granddaughter called to report that the patient has been complaining of generalized weakness for the last 2 weeks that's causing him some problems picking things up. She states that he has also had some blood in his stool that's been going on for about the same amount of time. She states that he has an upcoming appointment with a GI specialist to be evaluated for the blood in his stool. Appointment made for Wednesday. Patient's granddaughter instructed to call back for new or worsening symptoms. She verbalized understanding and agreement with this plan.     Reason for Disposition  [1] MILD weakness (i.e., does not interfere with ability to work, go to school, normal activities) AND [2] persists > 1 week  Answer Assessment - Initial Assessment Questions 1. DESCRIPTION: "Describe how you are feeling."     Generalized weakness 2. SEVERITY: "How bad is it?"  "Can you stand and walk?"   - MILD (0-3): Feels weak or tired, but does not interfere with work, school or normal activities.   - MODERATE (4-7): Able to stand and walk; weakness interferes with work, school, or normal activities.   - SEVERE (8-10): Unable to stand or walk; unable to do usual activities.     Moderate  3. ONSET: "When did these symptoms begin?" (e.g., hours, days, weeks, months)     2 weeks  4. CAUSE: "What do you think is causing the weakness or fatigue?" (e.g., not drinking  enough fluids, medical problem, trouble sleeping)     Unsure  5. NEW MEDICINES:  "Have you started on any new medicines recently?" (e.g., opioid pain medicines, benzodiazepines, muscle relaxants, antidepressants, antihistamines, neuroleptics, beta blockers)     No 6. OTHER SYMPTOMS: "Do you have any other symptoms?" (e.g., chest pain, fever, cough, SOB, vomiting, diarrhea, bleeding, other areas of pain)     Blood in stool  Protocols used: Weakness (Generalized) and Fatigue-A-AH

## 2023-06-11 ENCOUNTER — Ambulatory Visit: Admitting: Gastroenterology

## 2023-06-11 ENCOUNTER — Telehealth: Payer: Self-pay

## 2023-06-11 NOTE — Telephone Encounter (Signed)
 I spoke to Jack Hodges. He was notified that the balance is not with our office.  Copied from CRM [id number]. Topic: General - Billing Inquiry >> Jun 11, 2023  2:52 PM Jack Hodges wrote: Reason for CRM: Jack Hodges, patient's son is calling regarding a $5 bill he received for a phone call he received. He would like to know why if him and his father did not make the call.

## 2023-06-12 ENCOUNTER — Ambulatory Visit: Admitting: Physician Assistant

## 2023-06-12 ENCOUNTER — Encounter: Payer: Self-pay | Admitting: Physician Assistant

## 2023-06-12 VITALS — BP 102/62 | HR 66 | Temp 97.4°F | Ht 65.0 in | Wt 146.0 lb

## 2023-06-12 DIAGNOSIS — G5603 Carpal tunnel syndrome, bilateral upper limbs: Secondary | ICD-10-CM

## 2023-06-12 DIAGNOSIS — R531 Weakness: Secondary | ICD-10-CM | POA: Diagnosis not present

## 2023-06-12 DIAGNOSIS — I4819 Other persistent atrial fibrillation: Secondary | ICD-10-CM | POA: Diagnosis not present

## 2023-06-12 DIAGNOSIS — R634 Abnormal weight loss: Secondary | ICD-10-CM

## 2023-06-12 DIAGNOSIS — I110 Hypertensive heart disease with heart failure: Secondary | ICD-10-CM | POA: Diagnosis not present

## 2023-06-12 DIAGNOSIS — K921 Melena: Secondary | ICD-10-CM

## 2023-06-12 DIAGNOSIS — R7989 Other specified abnormal findings of blood chemistry: Secondary | ICD-10-CM | POA: Diagnosis not present

## 2023-06-12 HISTORY — DX: Melena: K92.1

## 2023-06-12 LAB — LAB REPORT - SCANNED: EGFR: 48

## 2023-06-12 NOTE — Patient Instructions (Signed)
 VISIT SUMMARY:  Jack Hodges, during today's visit, we discussed your recent symptoms of fatigue, weight loss, and hand weakness. You mentioned feeling very weak since last Saturday, experiencing chills, and noticing blood in your stool previously. Additionally, you have been struggling with increased difficulty in using your hand due to carpal tunnel syndrome. We reviewed your current medications and discussed the next steps for addressing these issues.  YOUR PLAN:  -ANEMIA: Anemia is a condition where you have a lower than normal number of red blood cells, which can cause fatigue, weakness, and weight loss. We will order urgent blood tests to check your blood count and refer you to a gastrointestinal specialist to evaluate the cause of blood in your stool. If your blood count is low, we may consider a blood transfusion.  -WEIGHT LOSS: Unintentional weight loss can be concerning and may be due to various underlying causes. We will order urgent blood tests to investigate the reasons for your weight loss. If the blood tests do not provide answers, we may consider an abdominal CT scan. Meanwhile, please continue to focus on your nutritional intake and keep drinking Ensure shakes.  -CARPAL TUNNEL SYNDROME: Carpal tunnel syndrome is a condition that causes numbness, tingling, and weakness in your hand due to pressure on the median nerve. Given the severity of your symptoms, we will refer you to a hand specialist for further evaluation and management. Treatment options may include steroid injections or surgery, depending on the specialist's recommendations.  -ATRIAL FIBRILLATION: Atrial fibrillation is an irregular and often rapid heart rate that can increase your risk of strokes and other heart-related complications. You are currently managing this with aspirin. We will perform an EKG to assess your current heart status, especially considering the potential impact of anemia.  INSTRUCTIONS:  Please follow up  with the urgent blood tests as soon as possible. We will also schedule an appointment with a gastrointestinal specialist to evaluate the cause of blood in your stool. Additionally, we will arrange a referral to a hand specialist for your carpal tunnel syndrome. An EKG will be performed to check your heart status. Continue with your current medications and nutritional intake, and keep drinking Ensure shakes. If you experience any new or worsening symptoms, please contact our office immediately.

## 2023-06-12 NOTE — Progress Notes (Unsigned)
 Acute Office Visit  Subjective:    Patient ID: Jack Hodges, male    DOB: 03-Nov-1930, 89 y.o.   MRN: [id number]  Chief Complaint  Patient presents with   Weakness    Discussed the use of AI scribe software for clinical note transcription with the patient, who gave verbal consent to proceed.   HPI: Patient is in today for worsening weakness, fatigue, blood in stool, dizziness.   Past Medical History:  Diagnosis Date   Acute bursitis of left shoulder 01/04/2020   Acute on chronic diastolic congestive heart failure (HCC) 11/13/2021   Last Assessment & Plan:  Formatting of this note is different from the original. Pertinent Data:   Current medication includes: furosemide - 20 mg lisinopriL - 10 mg metoprolol succinate - 25 mg.  BNP (pg/mL)  Date Value  11/15/2021 433.6 (H)   eGFR (mL/min/1.45m*2)  Date Value  11/15/2021 >60.0    BP Readings from Last 3 Encounters:  11/30/21 (!) 147/82  11/15/21 (!) 109/54  11/13/21 (!) 134/74     Acute respiratory failure with hypoxia (HCC) 03/17/2020   Angina pectoris (HCC) 10/07/2019   Arthritis    Atherosclerosis of aorta (HCC) 09/24/2019   Bilateral foot-drop 01/18/2017   BMI 27.0-27.9,adult 09/24/2019   Coronary artery disease involving native coronary artery of native heart without angina pectoris 11/01/2015   Overview:  Bypass surgery in 1996 Overview:  Overview:  Bypass surgery in 1996  Last Assessment & Plan:  Continue his current regimen.  Follow up as noted.   CVA (cerebral vascular accident) (HCC) 04/03/2016   Last Assessment & Plan:  The patient underwent extensive CVA workup.  MRI is negative.  He does have aortic stenosis by echocardiogram.  Upon review of Care everywhere he is followed by Dr. Bing Matter, cardiologist for this.  Carotid ultrasound negative for hemodynamically significant stenosis.  Lipid panel within normal limits.  He has not had any further episodes of dizziness or nausea vomiting.    Demand ischemia (HCC) 11/14/2021    Last Assessment & Plan:  Formatting of this note might be different from the original. Troponin flat No anginal symptoms Formatting of this note might be different from the original. Last Assessment & Plan:  Formatting of this note might be different from the original. Troponin flat No anginal symptoms   Dementia (HCC)    Depression    DNR (do not resuscitate) 04/03/2016   Overview:  I had an extensive discussion with the patient on 04/03/2016 regarding his end of life wishes.  He and his daughter agree that do not resuscitate is in keeping with his beliefs.   Dyslipidemia 11/01/2015   Last Assessment & Plan:  Formatting of this note might be different from the original. Cholesterol 136   Triglycerides  83  HDL  50.1  LDL Calculated  69  VLDL Cholesterol Cal  17   Continue statin.   Dyspnea    with activity   Essential hypertension 11/01/2015   Last Assessment & Plan:  Resume home medications.  Follow-up as noted above.   Generalized weakness 03/13/2020   GERD (gastroesophageal reflux disease)    Herpes zoster 05/19/2019   History of aortic stenosis 03/11/2020   History of CVA (cerebrovascular accident) 11/14/2021   Last Assessment & Plan:  Formatting of this note might be different from the original. Neurologically intact upon assessment -continue aspirin, statin   Hypertensive heart disease with heart failure (HCC) 11/01/2015   Last Assessment & Plan:  Resume home  medications.  Follow-up as noted above.   Myocardial infarction (HCC)    approx 1994   Neuropathy    Peripheral neuropathy 11/14/2021   Last Assessment & Plan:  Formatting of this note might be different from the original. Bilateral lower extremity cool to touch with diminished sensation Patient reports findings are chronic Continue home dose Lyrica Formatting of this note might be different from the original. Last Assessment & Plan:  Formatting of this note might be different from the original. 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   Thrombocytopenia (HCC) 03/13/2020   Trochanteric bursitis of left hip 01/19/2021    Past Surgical History:  Procedure Laterality Date   CARDIAC CATHETERIZATION     CARDIAC SURGERY     CATARACT EXTRACTION, BILATERAL     CORONARY ARTERY BYPASS GRAFT     CORONARY STENT INTERVENTION N/A 10/07/2019   Procedure: CORONARY STENT INTERVENTION;  Surgeon: Tonny Bollman, MD;  Location: [facility] INVASIVE CV LAB;  Service: Cardiovascular;  Laterality: N/A;   EYE SURGERY     HEMORROIDECTOMY     RIGHT/LEFT HEART CATH AND CORONARY/GRAFT ANGIOGRAPHY N/A 10/07/2019   Procedure: RIGHT/LEFT HEART CATH AND CORONARY/GRAFT ANGIOGRAPHY;  Surgeon: Tonny Bollman, MD;  Location: [facility] INVASIVE CV LAB;  Service: Cardiovascular;  Laterality: N/A;   TEE WITHOUT CARDIOVERSION N/A 10/27/2019   Procedure: TRANSESOPHAGEAL ECHOCARDIOGRAM (TEE);  Surgeon: Kathleene Hazel, MD;  Location: [facility] INVASIVE CV LAB;  Service: Open Heart Surgery;  Laterality: N/A;   TRANSCATHETER AORTIC VALVE REPLACEMENT, TRANSFEMORAL N/A 10/27/2019   Procedure: TRANSCATHETER AORTIC VALVE REPLACEMENT, TRANSFEMORAL;  Surgeon: Kathleene Hazel, MD;  Location: [facility] INVASIVE CV LAB;  Service: Open Heart Surgery;  Laterality: N/A;    Family History  Problem Relation Age of Onset   Cancer Mother    Diabetes Mother    Kidney disease Mother    Stroke Mother    Sleep disorder Mother    Arthritis Father    Migraines Father    Hypertension Father    Sleep disorder Father    Arthritis Brother     Cancer Brother     Social History   Socioeconomic History   Marital status: Widowed    Spouse name: Dorann Lodge   Number of children: 2   Years of education: Not on file   Highest education level: Not on file  Occupational History   Occupation: Retired    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  He has one son that lives with him and one  daughter that lives beside of him. He is close with his family.  His sister cooks meals for him regularly.  He has friends at the cafe he visits regularly.   Social Drivers of Corporate investment banker Strain: Low Risk  (03/07/2023)   Overall Financial Resource Strain (CARDIA)    Difficulty of Paying Living Expenses: Not hard at all  Food Insecurity: No Food Insecurity (03/07/2023)   Hunger Vital Sign    Worried About Running Out of Food in the Last Year: Never true    Ran Out of Food in the Last Year: Never true  Transportation Needs: No Transportation Needs (03/07/2023)   PRAPARE - Administrator, Civil Service (Medical): No    Lack of Transportation (Non-Medical): No  Physical Activity: Insufficiently Active (03/07/2023)   Exercise Vital Sign    Days of Exercise per Week: 2 days    Minutes of Exercise per Session: 10 min  Stress: No Stress Concern Present (03/07/2023)   [employer] of Occupational Health - Occupational Stress Questionnaire    Feeling of Stress : Not at all  Social Connections: Moderately Isolated (03/07/2023)   Social Connection and Isolation Panel [NHANES]     Frequency of Communication with Friends and Family: More than three times a week    Frequency of Social Gatherings with Friends and Family: More than three times a week    Attends Religious Services: 1 to 4 times per year    Active Member of [employer] or Organizations: No    Attends Banker Meetings: Never    Marital Status: Widowed  Intimate Partner Violence: Not At Risk (03/07/2023)   Humiliation, Afraid, Rape, and Kick questionnaire    Fear of Current or Ex-Partner: No    Emotionally Abused: No    Physically Abused: No    Sexually Abused: No    Outpatient Medications Prior to Visit  Medication Sig Dispense Refill   acetaminophen (TYLENOL) 650 MG CR tablet Take 650 mg by mouth every 8 (eight) hours as needed for pain.      albuterol (VENTOLIN HFA) 108 (90 Base) MCG/ACT inhaler Inhale 2 puffs into the lungs every 2 (two) hours as needed for wheezing or shortness of breath. 6.7 g 6   amLODipine (NORVASC) 5 MG tablet Take 5 mg by mouth daily.     ammonium lactate (LAC-HYDRIN) 12 % lotion Apply 1 Application topically 2 (two) times daily. Apply twice daily to arms and hands to increase moisture and reduce easy bruising.  Can also be used safely on neck, chest, and legs as needed 400 g 0   ascorbic acid (VITAMIN C) 500 MG tablet Take 1 tablet (500 mg total) by mouth daily. 30 tablet 0   aspirin EC 81 MG tablet Take 1 tablet (81 mg total) by mouth daily. Swallow whole. 90 tablet 1   carbidopa-levodopa (SINEMET) 25-100 MG tablet Take 1 tablet by mouth at bedtime as needed (rESTLESS LEGS.). 90 tablet 1   carvedilol (COREG) 25 MG tablet Take 1 tablet (25 mg total) by mouth 2 (two) times daily. 60 tablet 3   cholecalciferol (VITAMIN D3) 25 MCG (1000 UNIT) tablet Take 1 tablet (1,000 Units total) by mouth daily. 90 tablet 1   ezetimibe (ZETIA) 10 MG tablet Take 1 tablet (10 mg total) by mouth daily. 90 tablet 1   fluorometholone (FML) 0.1 % ophthalmic suspension Place 1 drop into both eyes  daily.     gabapentin (NEURONTIN) 300 MG capsule Take  1 capsule (300 mg total) by mouth 3 (three) times daily. 90 capsule 0   meclizine (ANTIVERT) 25 MG tablet Take 1 tablet (25 mg total) by mouth every 6 (six) hours. 30 tablet 1   neomycin-polymyxin b-dexamethasone (MAXITROL) 3.5-10000-0.1 SUSP Place 1 drop into both eyes every 6 (six) hours.     olmesartan (BENICAR) 40 MG tablet Take 1 tablet (40 mg total) by mouth daily. 90 tablet 1   pantoprazole (PROTONIX) 40 MG tablet Take 1 tablet (40 mg total) by mouth daily. 100 tablet 1   rosuvastatin (CRESTOR) 20 MG tablet Take 1 tablet (20 mg total) by mouth at bedtime. 90 tablet 3   sertraline (ZOLOFT) 50 MG tablet Take 1 tablet (50 mg total) by mouth daily. 90 tablet 1   spironolactone (ALDACTONE) 25 MG tablet Take 1 tablet (25 mg total) by mouth once daily 30 tablet 3   vitamin B-12 (CYANOCOBALAMIN) 500 MCG tablet Take 500 mcg by mouth daily.     zinc sulfate 220 (50 Zn) MG capsule Take 1 capsule (220 mg total) by mouth daily. 90 capsule 1   furosemide (LASIX) 80 MG tablet Take 1 tablet (80 mg total) by mouth daily. 90 tablet 3   nitroGLYCERIN (NITROSTAT) 0.4 MG SL tablet Place 1 tablet (0.4 mg total) under the tongue every 5 (five) minutes as needed for chest pain. 25 tablet 2   potassium chloride (KLOR-CON) 10 MEQ tablet Take 1 tablet (10 mEq total) by mouth daily. 90 tablet 3   No facility-administered medications prior to visit.    Allergies  Allergen Reactions   Poison Oak Extract Rash    Review of Systems  Constitutional:  Positive for fatigue. Negative for appetite change and fever.  HENT:  Negative for congestion, ear pain, sinus pressure and sore throat.   Respiratory:  Negative for cough, chest tightness, shortness of breath and wheezing.   Cardiovascular:  Negative for chest pain and palpitations.  Gastrointestinal:  Negative for abdominal pain, constipation, diarrhea, nausea and vomiting.  Genitourinary:  Negative for dysuria  and hematuria.  Musculoskeletal:  Negative for arthralgias, back pain, joint swelling and myalgias.  Skin:  Negative for rash.  Neurological:  Positive for dizziness and weakness. Negative for headaches.  Psychiatric/Behavioral:  Negative for dysphoric mood. The patient is not nervous/anxious.        Objective:        06/12/2023   10:01 AM 05/08/2023    9:40 AM 04/29/2023    2:11 PM  Vitals with BMI  Height 5\' 5"  5\' 5"  5\' 5"   Weight 146 lbs 157 lbs 3 oz 158 lbs  BMI 24.3 26.16 26.29  Systolic 102 102 578  Diastolic 62 60 78  Pulse 66 62 63    Orthostatic VS for the past 72 hrs (Last 3 readings):  Patient Position BP Location  06/12/23 1001 Sitting Left Arm     Physical Exam  There are no preventive care reminders to display for this patient.  There are no preventive care reminders to display for this patient.   No results found for: "TSH" Lab Results  Component Value Date   WBC 15.4 (H) 05/08/2023   HGB 10.1 (L) 05/08/2023   HCT 32.2 (L) 05/08/2023   MCV 89 05/08/2023   PLT 328 05/08/2023   Lab Results  Component Value Date   NA 141 05/08/2023   K 4.3 05/08/2023   CO2 20 05/08/2023   GLUCOSE 80 05/08/2023   BUN 18 05/08/2023   CREATININE  1.09 05/08/2023   BILITOT 1.2 05/08/2023   ALKPHOS 135 (H) 05/08/2023   AST 14 05/08/2023   ALT 10 05/08/2023   PROT 6.0 05/08/2023   ALBUMIN 4.0 05/08/2023   CALCIUM 9.1 05/08/2023   ANIONGAP 14 03/29/2023   EGFR 64 05/08/2023   Lab Results  Component Value Date   CHOL 106 05/08/2023   Lab Results  Component Value Date   HDL 27 (L) 05/08/2023   Lab Results  Component Value Date   LDLCALC 58 05/08/2023   Lab Results  Component Value Date   TRIG 112 05/08/2023   Lab Results  Component Value Date   CHOLHDL 3.9 05/08/2023   Lab Results  Component Value Date   HGBA1C 5.3 05/08/2023       Assessment & Plan:  There are no diagnoses linked to this encounter.   Assessment and Plan       No orders  of the defined types were placed in this encounter.   No orders of the defined types were placed in this encounter.    Follow-up: No follow-ups on file.  An After Visit Summary was printed and given to the patient.   I,Lauren M Auman,acting as a Neurosurgeon for [employer], PA.,have documented all relevant documentation on the behalf of Langley Gauss, PA,as directed by  Langley Gauss, PA while in the presence of Langley Gauss, [place].    Langley Gauss, [place] [facility] [phone]

## 2023-06-13 ENCOUNTER — Encounter: Payer: Self-pay | Admitting: Physician Assistant

## 2023-06-13 DIAGNOSIS — R634 Abnormal weight loss: Secondary | ICD-10-CM | POA: Insufficient documentation

## 2023-06-13 DIAGNOSIS — G5603 Carpal tunnel syndrome, bilateral upper limbs: Secondary | ICD-10-CM | POA: Insufficient documentation

## 2023-06-13 DIAGNOSIS — R7989 Other specified abnormal findings of blood chemistry: Secondary | ICD-10-CM

## 2023-06-13 HISTORY — DX: Other specified abnormal findings of blood chemistry: R79.89

## 2023-06-13 HISTORY — DX: Abnormal weight loss: R63.4

## 2023-06-13 HISTORY — DX: Carpal tunnel syndrome, bilateral upper limbs: G56.03

## 2023-06-13 NOTE — Assessment & Plan Note (Signed)
 Unintentional loss of over 10 pounds with decreased appetite and inadequate nutrition. Stat labs to assess causes; abdominal CT if labs inconclusive. - Order stat labs to assess for potential causes of weight loss. - Consider abdominal CT if labs are inconclusive. - Encourage nutritional intake and continue Ensure shakes.

## 2023-06-13 NOTE — Assessment & Plan Note (Signed)
 Managed with aspirin. No recent episodes. EKG needed to assess current cardiac status, especially with potential anemia. - Perform EKG to assess current cardiac status.

## 2023-06-13 NOTE — Assessment & Plan Note (Signed)
 Has appointment with GI Keep appointment  Continue to monitor for worsening symptoms

## 2023-06-13 NOTE — Assessment & Plan Note (Signed)
 Suspected due to low blood count and recent hematochezia. Symptoms include fatigue, weakness, and weight loss. Explained transfusion safety, including virus and infection testing. - Order stat labs to assess blood count. - Refer to GI for evaluation of hematochezia. - Consider blood transfusion if blood count is low.

## 2023-06-13 NOTE — Assessment & Plan Note (Signed)
 Controlled Continue with current treatment Will adjust treatment as needed based on BP and labwork

## 2023-06-13 NOTE — Assessment & Plan Note (Signed)
 Severe in right hand with numbness, tingling, and muscle atrophy. Discussed treatment options including steroid injection and surgery. Preference for hand specialist evaluation due to severity. - Refer to hand specialist for evaluation and management. - Consider steroid injection or surgery based on specialist's recommendation.

## 2023-06-14 ENCOUNTER — Other Ambulatory Visit: Payer: Self-pay | Admitting: Physician Assistant

## 2023-06-14 DIAGNOSIS — G6289 Other specified polyneuropathies: Secondary | ICD-10-CM

## 2023-06-14 NOTE — Telephone Encounter (Unsigned)
 Copied from CRM [id number]. Topic: Clinical - Medication Refill >> Jun 14, 2023 10:09 AM Higinio Roger wrote: Most Recent Primary Care Visit:  Provider: Langley Gauss  Department: [facility]  Visit Type: ACUTE  Date: 06/12/2023  Medication: gabapentin (NEURONTIN) 300 MG capsule   Has the patient contacted their pharmacy? Yes (Agent: If no, request that the patient contact the pharmacy for the refill. If patient does not wish to contact the pharmacy document the reason why and proceed with request.) (Agent: If yes, when and what did the pharmacy advise?)  Is this the correct pharmacy for this prescription? Yes If no, delete pharmacy and type the correct one.  This is the patient's preferred pharmacy:   [facility] - [place], [place] - [address] [place] [address] Phone: [phone] Fax: [phone]  Has the prescription been filled recently? Yes  Is the patient out of the medication? Yes  Has the patient been seen for an appointment in the last year OR does the patient have an upcoming appointment? Yes  Can we respond through MyChart? Yes  Agent: Please be advised that Rx refills may take up to 3 business days. We ask that you follow-up with your pharmacy.

## 2023-06-17 ENCOUNTER — Ambulatory Visit: Admitting: Gastroenterology

## 2023-06-17 ENCOUNTER — Other Ambulatory Visit: Payer: Self-pay | Admitting: Hematology and Oncology

## 2023-06-17 ENCOUNTER — Encounter: Payer: Self-pay | Admitting: Gastroenterology

## 2023-06-17 VITALS — BP 96/60 | HR 58 | Ht 65.0 in | Wt 149.1 lb

## 2023-06-17 DIAGNOSIS — R195 Other fecal abnormalities: Secondary | ICD-10-CM

## 2023-06-17 DIAGNOSIS — K625 Hemorrhage of anus and rectum: Secondary | ICD-10-CM | POA: Diagnosis not present

## 2023-06-17 DIAGNOSIS — D649 Anemia, unspecified: Secondary | ICD-10-CM

## 2023-06-17 DIAGNOSIS — Z800 Family history of malignant neoplasm of digestive organs: Secondary | ICD-10-CM

## 2023-06-17 DIAGNOSIS — K5909 Other constipation: Secondary | ICD-10-CM | POA: Diagnosis not present

## 2023-06-17 DIAGNOSIS — R630 Anorexia: Secondary | ICD-10-CM

## 2023-06-17 DIAGNOSIS — R634 Abnormal weight loss: Secondary | ICD-10-CM | POA: Diagnosis not present

## 2023-06-17 DIAGNOSIS — K219 Gastro-esophageal reflux disease without esophagitis: Secondary | ICD-10-CM | POA: Diagnosis not present

## 2023-06-17 MED ORDER — SUTAB 1479-225-188 MG PO TABS: ORAL_TABLET | ORAL | 0 refills | Status: DC

## 2023-06-17 MED ORDER — GABAPENTIN 300 MG PO CAPS: 300.0000 mg | ORAL_CAPSULE | Freq: Three times a day (TID) | ORAL | 0 refills | Status: DC

## 2023-06-17 NOTE — H&P (View-Only) (Signed)
 Chief Complaint: Increasing amounts of blood in his stool, anemia, weight loss,positive heme occult cards.  Primary GI Doctor: Dr. Tomasa Rand   HPI:  Patient is a 89 year old male patient with past medical history of CVA,atrial fibrillation, CAD, dementia, and GERD, who was referred to me by Langley Gauss, PA on 06/09/23 for a complaint of increasing amounts of blood in his stool,anemia, and positive heme occult cards.    On 06/12/2023 patient seen by PCP with main complaint of fatigue, weight loss, and hand weakness.  Patient also mentions having blood in stool although he has not noticed any recently.  Patient has lost about 10 pounds since February and admits to not eating much and skipping meals.  Referral placed to hematology/oncology. Labs drawn: Potassium 3.3, BUN 31, creatinine 1.40, total protein 6.1, iron 37, TIBC 242, saturation percentage 15.2, hemoglobin 10.4, white blood count 18.6, platelets 458, sed rate 32, magnesium 1.9 05/30/23 Fecal occult x3- two of three positive  Interval History    Patient presents for evaluation of anemia, accompanied by his son. Patient states he has chronic issues with straining and going to restroom. He will go 2-3 days without bowel movement. If he strains he will notice BRB on toilet paper with wiping. Admits to dizziness and fatigue. 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 He states his appetite has decreased since his wife passed several years ago. She was his cook. He states he eats 2-3 times per day. Small meals that consist of cereal, soup, and Ensures. Denies abdominal pain.     Patient has history of  GERD and it is controlled with Pantoprazole 40mg  po daily. Patient denies dysphagia. Patient denies nausea, vomiting. No alcohol use or smoking.  Patient reports they stopped his baby aspirin 81mg  po daily about two weeks ago.  Per his son, he had TIA about a month or so ago.  Patient's family history includes daughter  throat CA, mother with bone CA, and brother with bladder and liver CA.  Patient has never had EGD. Patient had colonoscopy approximately 10 years ago in [place] and per patient normal. He has had hemorrhoidectomy in past for hemorrhoids.   Wt Readings from Last 3 Encounters:  06/17/23 149 lb 2 oz (67.6 kg)  06/12/23 146 lb (66.2 kg)  05/08/23 157 lb 3.2 oz (71.3 kg)    Past Medical History:  Diagnosis Date   Acute bursitis of left shoulder 01/04/2020   Acute on chronic diastolic congestive heart failure (HCC) 11/13/2021   Last Assessment & Plan:  Formatting of this note is different from the original. Pertinent Data:   Current medication includes: furosemide - 20 mg lisinopriL - 10 mg metoprolol succinate - 25 mg.  BNP (pg/mL)  Date Value  11/15/2021 433.6 (H)   eGFR (mL/min/1.68m*2)  Date Value  11/15/2021 >60.0    BP Readings from Last 3 Encounters:  11/30/21 (!) 147/82  11/15/21 (!) 109/54  11/13/21 (!) 134/74     Acute respiratory failure with hypoxia (HCC) 03/17/2020   Angina pectoris (HCC) 10/07/2019   Arthritis    Atherosclerosis of aorta (HCC) 09/24/2019   Bilateral foot-drop 01/18/2017   BMI 27.0-27.9,adult 09/24/2019   Coronary artery disease involving native coronary artery of native heart without angina pectoris 11/01/2015   Overview:  Bypass surgery in 1996 Overview:  Overview:  Bypass surgery in 1996  Last Assessment & Plan:  Continue his current regimen.  Follow up as noted.  CVA (cerebral vascular accident) (HCC) 04/03/2016   Last Assessment & Plan:  The patient underwent extensive CVA workup.  MRI is negative.  He does have aortic stenosis by echocardiogram.  Upon review of Care everywhere he is followed by Dr. Bing Matter, cardiologist for this.  Carotid ultrasound negative for hemodynamically significant stenosis.  Lipid panel within normal limits.  He has not had any further episodes of dizziness or nausea vomiting.    Demand ischemia (HCC) 11/14/2021   Last Assessment &  Plan:  Formatting of this note might be different from the original. Troponin flat No anginal symptoms Formatting of this note might be different from the original. Last Assessment & Plan:  Formatting of this note might be different from the original. Troponin flat No anginal symptoms   Dementia (HCC)    Depression    DNR (do not resuscitate) 04/03/2016   Overview:  I had an extensive discussion with the patient on 04/03/2016 regarding his end of life wishes.  He and his daughter agree that do not resuscitate is in keeping with his beliefs.   Dyslipidemia 11/01/2015   Last Assessment & Plan:  Formatting of this note might be different from the original. Cholesterol 136   Triglycerides  83  HDL  50.1  LDL Calculated  69  VLDL Cholesterol Cal  17   Continue statin.   Dyspnea    with activity   Essential hypertension 11/01/2015   Last Assessment & Plan:  Resume home medications.  Follow-up as noted above.   Generalized weakness 03/13/2020   GERD (gastroesophageal reflux disease)    Herpes zoster 05/19/2019   History of aortic stenosis 03/11/2020   History of CVA (cerebrovascular accident) 11/14/2021   Last Assessment & Plan:  Formatting of this note might be different from the original. Neurologically intact upon assessment -continue aspirin, statin   Hypertensive heart disease with heart failure (HCC) 11/01/2015   Last Assessment & Plan:  Resume home medications.  Follow-up as noted above.   Myocardial infarction (HCC)    approx 1994   Neuropathy    Peripheral neuropathy 11/14/2021   Last Assessment & Plan:  Formatting of this note might be different from the original. Bilateral lower extremity cool to touch with diminished sensation Patient reports findings are chronic Continue home dose Lyrica Formatting of this note might be different from the original. Last Assessment & Plan:  Formatting of this note might be different from the original. 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   Thrombocytopenia (HCC) 03/13/2020   Trochanteric bursitis of left hip 01/19/2021    Past Surgical History:  Procedure Laterality Date   CARDIAC CATHETERIZATION     CARDIAC SURGERY     CATARACT EXTRACTION, BILATERAL     COLONOSCOPY     [facility]   CORONARY ARTERY BYPASS GRAFT     CORONARY STENT INTERVENTION N/A 10/07/2019   Procedure: CORONARY STENT INTERVENTION;  Surgeon: Tonny Bollman, MD;  Location: [facility] INVASIVE CV LAB;  Service: Cardiovascular;  Laterality: N/A;   EYE SURGERY  HEMORROIDECTOMY     RIGHT/LEFT HEART CATH AND CORONARY/GRAFT ANGIOGRAPHY N/A 10/07/2019   Procedure: RIGHT/LEFT HEART CATH AND CORONARY/GRAFT ANGIOGRAPHY;  Surgeon: Tonny Bollman, MD;  Location: [facility] INVASIVE CV LAB;  Service: Cardiovascular;  Laterality: N/A;   TEE WITHOUT CARDIOVERSION N/A 10/27/2019   Procedure: TRANSESOPHAGEAL ECHOCARDIOGRAM (TEE);  Surgeon: Kathleene Hazel, MD;  Location: [facility] INVASIVE CV LAB;  Service: Open Heart Surgery;  Laterality: N/A;   TRANSCATHETER AORTIC VALVE REPLACEMENT, TRANSFEMORAL N/A 10/27/2019   Procedure: TRANSCATHETER AORTIC VALVE REPLACEMENT, TRANSFEMORAL;  Surgeon: Kathleene Hazel, MD;  Location: [facility] INVASIVE CV LAB;  Service: Open Heart Surgery;  Laterality: N/A;    Current Outpatient Medications  Medication Sig Dispense Refill   acetaminophen (TYLENOL) 650 MG CR tablet Take 650 mg by mouth every 8 (eight) hours as needed for pain.      albuterol (VENTOLIN HFA) 108 (90 Base) MCG/ACT inhaler Inhale 2 puffs  into the lungs every 2 (two) hours as needed for wheezing or shortness of breath. 6.7 g 6   amLODipine (NORVASC) 5 MG tablet Take 5 mg by mouth daily.     ammonium lactate (LAC-HYDRIN) 12 % lotion Apply 1 Application topically 2 (two) times daily. Apply twice daily to arms and hands to increase moisture and reduce easy bruising.  Can also be used safely on neck, chest, and legs as needed 400 g 0   ascorbic acid (VITAMIN C) 500 MG tablet Take 1 tablet (500 mg total) by mouth daily. 30 tablet 0   aspirin EC 81 MG tablet Take 1 tablet (81 mg total) by mouth daily. Swallow whole. 90 tablet 1   carbidopa-levodopa (SINEMET) 25-100 MG tablet Take 1 tablet by mouth at bedtime as needed (rESTLESS LEGS.). 90 tablet 1   carvedilol (COREG) 25 MG tablet Take 1 tablet (25 mg total) by mouth 2 (two) times daily. 60 tablet 3   cholecalciferol (VITAMIN D3) 25 MCG (1000 UNIT) tablet Take 1 tablet (1,000 Units total) by mouth daily. 90 tablet 1   ezetimibe (ZETIA) 10 MG tablet Take 1 tablet (10 mg total) by mouth daily. 90 tablet 1   fluorometholone (FML) 0.1 % ophthalmic suspension Place 1 drop into both eyes daily.     gabapentin (NEURONTIN) 300 MG capsule Take 1 capsule (300 mg total) by mouth 3 (three) times daily. 90 capsule 0   meclizine (ANTIVERT) 25 MG tablet Take 1 tablet (25 mg total) by mouth every 6 (six) hours. 30 tablet 1   neomycin-polymyxin b-dexamethasone (MAXITROL) 3.5-10000-0.1 SUSP Place 1 drop into both eyes every 6 (six) hours.     olmesartan (BENICAR) 40 MG tablet Take 1 tablet (40 mg total) by mouth daily. 90 tablet 1   pantoprazole (PROTONIX) 40 MG tablet Take 1 tablet (40 mg total) by mouth daily. 100 tablet 1   rosuvastatin (CRESTOR) 20 MG tablet Take 1 tablet (20 mg total) by mouth at bedtime. 90 tablet 3   sertraline (ZOLOFT) 50 MG tablet Take 1 tablet (50 mg total) by mouth daily. 90 tablet 1   spironolactone (ALDACTONE) 25 MG tablet Take 1 tablet (25 mg total) by mouth once daily 30  tablet 3   vitamin B-12 (CYANOCOBALAMIN) 500 MCG tablet Take 500 mcg by mouth daily.     zinc sulfate 220 (50 Zn) MG capsule Take 1 capsule (220 mg total) by mouth daily. 90 capsule 1   furosemide (LASIX) 80 MG tablet Take 1 tablet (80 mg total) by mouth daily. 90 tablet 3   nitroGLYCERIN (  NITROSTAT) 0.4 MG SL tablet Place 1 tablet (0.4 mg total) under the tongue every 5 (five) minutes as needed for chest pain. 25 tablet 2   potassium chloride (KLOR-CON) 10 MEQ tablet Take 1 tablet (10 mEq total) by mouth daily. 90 tablet 3   No current facility-administered medications for this visit.    Allergies as of 06/17/2023 - Review Complete 06/12/2023  Allergen Reaction Noted   Poison oak extract Rash 06/17/2019    Family History  Problem Relation Age of Onset   Cancer Mother        bone   Diabetes Mother    Kidney disease Mother    Stroke Mother    Sleep disorder Mother    Arthritis Father    Migraines Father    Hypertension Father    Sleep disorder Father    Arthritis Brother    Cancer Brother        throat   Throat cancer Daughter    Review of Systems:    Constitutional: No weight loss, fever, chills, weakness or fatigue HEENT: Eyes: No change in vision               Ears, Nose, Throat:  No change in hearing or congestion Skin: No rash or itching Cardiovascular: No chest pain, chest pressure or palpitations   Respiratory: No SOB or cough Gastrointestinal: See HPI and otherwise negative Genitourinary: No dysuria or change in urinary frequency Neurological: No headache, dizziness or syncope Musculoskeletal: No new muscle or joint pain Hematologic: No bleeding or bruising Psychiatric: No history of depression or anxiety    Physical Exam:  Vital signs: BP 96/60   Pulse (!) 58   Ht 5\' 5"  (1.651 m)   Wt 149 lb 2 oz (67.6 kg)   BMI 24.82 kg/m   Constitutional: Pleasant  male appears to be in NAD, frail, alert and cooperative Throat: Oral cavity and pharynx without  inflammation, swelling or lesion.  Respiratory: Respirations even and unlabored. Lungs clear to auscultation bilaterally.   No wheezes, crackles, or rhonchi.  Cardiovascular: Normal S1, S2. Regular rate and rhythm. No peripheral edema, cyanosis or pallor.  Gastrointestinal:  Soft, nondistended, nontender. No rebound or guarding. Hypoactive bowel sounds. No appreciable masses or hepatomegaly. Rectal:  Declined. Anoscopy: Declined Msk:  Symmetrical without gross deformities. Arthritis in right hand. Neurologic:  Alert and  oriented x4;  grossly normal neurologically. Numbness and pain to right hand. Stroke affected left hand , weakness. Uses cane. Skin:   Dry and intact without significant lesions or rashes. Psychiatric: Oriented to person, place and time. Demonstrates good judgement and reason without abnormal affect or behaviors.  RELEVANT LABS AND IMAGING: CBC    Latest Ref Rng & Units 05/08/2023   10:44 AM 03/29/2023    7:02 PM 03/29/2023    6:57 PM  CBC  WBC 3.4 - 10.8 x10E3/uL 15.4  18.4    Hemoglobin 13.0 - 17.7 g/dL 78.4  69.6  29.5   Hematocrit 37.5 - 51.0 % 32.2  38.4  36.0   Platelets 150 - 450 x10E3/uL 328  388       CMP     Latest Ref Rng & Units 05/08/2023   10:44 AM 03/29/2023    7:02 PM 03/29/2023    6:57 PM  CMP  Glucose 70 - 99 mg/dL 80  284  132   BUN 10 - 36 mg/dL 18  22  22    Creatinine 0.76 - 1.27 mg/dL 4.40  1.02  7.25  Sodium 134 - 144 mmol/L 141  141  138   Potassium 3.5 - 5.2 mmol/L 4.3  3.7  3.6   Chloride 96 - 106 mmol/L 102  103  105   CO2 20 - 29 mmol/L 20  24    Calcium 8.6 - 10.2 mg/dL 9.1  9.3    Total Protein 6.0 - 8.5 g/dL 6.0  6.7    Total Bilirubin 0.0 - 1.2 mg/dL 1.2  1.4    Alkaline Phos 44 - 121 IU/L 135  88    AST 0 - 40 IU/L 14  18    ALT 0 - 44 IU/L 10  16    11/23/22 echo-  Left ventricular ejection fraction, by estimation, is 50 to 55%.  03/29/2023 CT angio head neck with and without contrast CTA: 1. No emergent proximal large vessel  occlusion. Limited distal vessel evaluation due to delayed/venous contrast bolus timing. 2. Severe left P2 PCA stenosis. 3. Severe stenosis of the right vertebral artery is dural margin. 4. Approximately 50% stenosis of the proximal left ICA in the neck. 5.  Aortic Atherosclerosis (ICD10-I70.0).  03/30/2023 MR brain without contrast IMPRESSION: No evidence of acute intracranial abnormality.  Assessment: Encounter Diagnoses  Name Primary?   Loss of weight Yes   Positive fecal occult blood test    Chronic constipation    Anemia, unspecified type    Loss of appetite    Gastroesophageal reflux disease, unspecified whether esophagitis present    Family history of esophageal cancer    Rectal bleeding       89 year old male patient that presents for positive fecal occult and 2 point drop in hemoglobin, 12.6 in January 2025, currently Hgb 10.4. Patient notes intermittent episodes of rectal bleeding over extended period of time, history of hemorrhoids but states they took care of them with surgical removal. Last colonoscopy over 10 years ago. He does has issues with constipation and we discussed adding more fiber into his daily regimen. He also has had poor appetite and lost 10lbs in past month.  Hx of GERD and currently taking PPI therapy. Denies dysphagia. Family history of esophageal CA in his daughter. Appt tomorrow with Hem/Onc for evaluation.  Given his age and recent TIA (per son in past month) will schedule upper GI endoscopy and colonoscopy at hospital. He would like to proceed with endoscopic procedures.  Plan: -Recommend Citrucel 1 tsp po daily -Recommend Fiber one cereal -Reinforced Ensures BID-TID po daily.  -Follow-up Hematology/Oncology appointment tomorrow  -Schedule EGD in hospital with Dr. Tomasa Rand. The risks and benefits of EGD with possible biopsies and esophageal dilation were discussed with the patient who agrees to proceed.  - Schedule  a colonoscopy with Dr. Tomasa Rand .  The risks and benefits of colonoscopy with possible polypectomy / biopsies were discussed and the patient agrees to proceed.  -Advised to go to the ER if there is any severe weakness, severe abdominal pain, vomit blood, dark red blood in your bowel movement, shortness of breath or chest pain.    Thank you for the courtesy of this consult. Please call me with any questions or concerns.   Millan Legan, FNP-C [facility] 06/17/2023, 2:12 PM  Cc: Langley Gauss, PA

## 2023-06-17 NOTE — Patient Instructions (Addendum)
 Recommend Citrucel 1 tsp po daily Recommend Fiber one cereal po daily Recommend Ensures two to three times daily  Advised to go to the ER if there is any severe weakness, severe abdominal pain, vomit blood, dark red blood in your bowel movement, shortness of breath or chest pain.    You have been scheduled for an endoscopy and colonoscopy. Please follow the written instructions given to you at your visit today.  If you use inhalers (even only as needed), please bring them with you on the day of your procedure.  DO NOT TAKE 7 DAYS PRIOR TO TEST- Trulicity (dulaglutide) Ozempic, Wegovy (semaglutide) Mounjaro (tirzepatide) Bydureon Bcise (exanatide extended release)  DO NOT TAKE 1 DAY PRIOR TO YOUR TEST Rybelsus (semaglutide) Adlyxin (lixisenatide) Victoza (liraglutide) Byetta (exanatide) ___________________________________________________________________________   Thank you for trusting me with your gastrointestinal care!   Deanna May, NP     _______________________________________________________  If your blood pressure at your visit was 140/90 or greater, please contact your primary care physician to follow up on this.  _______________________________________________________  If you are age 88 or older, your body mass index should be between 23-30. Your Body mass index is 24.82 kg/m. If this is out of the aforementioned range listed, please consider follow up with your Primary Care Provider.  If you are age 38 or younger, your body mass index should be between 19-25. Your Body mass index is 24.82 kg/m. If this is out of the aformentioned range listed, please consider follow up with your Primary Care Provider.   ________________________________________________________  The [facility] providers would like to encourage you to use [facility] to communicate with providers for non-urgent requests or questions.  Due to long hold times on the telephone, sending your provider a message  by [facility] may be a faster and more efficient way to get a response.  Please allow 48 business hours for a response.  Please remember that this is for non-urgent requests.  _______________________________________________________

## 2023-06-17 NOTE — Progress Notes (Signed)
 Chief Complaint: Increasing amounts of blood in his stool, anemia, weight loss,positive heme occult cards.  Primary GI Doctor: Dr. Tomasa Rand   HPI:  Patient is a 89 year old male patient with past medical history of CVA,atrial fibrillation, CAD, dementia, and GERD, who was referred to me by Langley Gauss, PA on 06/09/23 for a complaint of increasing amounts of blood in his stool,anemia, and positive heme occult cards.    On 06/12/2023 patient seen by PCP with main complaint of fatigue, weight loss, and hand weakness.  Patient also mentions having blood in stool although he has not noticed any recently.  Patient has lost about 10 pounds since February and admits to not eating much and skipping meals.  Referral placed to hematology/oncology. Labs drawn: Potassium 3.3, BUN 31, creatinine 1.40, total protein 6.1, iron 37, TIBC 242, saturation percentage 15.2, hemoglobin 10.4, white blood count 18.6, platelets 458, sed rate 32, magnesium 1.9 05/30/23 Fecal occult x3- two of three positive  Interval History    Patient presents for evaluation of anemia, accompanied by his son. Patient states he has chronic issues with straining and going to restroom. He will go 2-3 days without bowel movement. If he strains he will notice BRB on toilet paper with wiping. Admits to dizziness and fatigue. 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 He states his appetite has decreased since his wife passed several years ago. She was his cook. He states he eats 2-3 times per day. Small meals that consist of cereal, soup, and Ensures. Denies abdominal pain.     Patient has history of  GERD and it is controlled with Pantoprazole 40mg  po daily. Patient denies dysphagia. Patient denies nausea, vomiting. No alcohol use or smoking.  Patient reports they stopped his baby aspirin 81mg  po daily about two weeks ago.  Per his son, he had TIA about a month or so ago.  Patient's family history includes daughter  throat CA, mother with bone CA, and brother with bladder and liver CA.  Patient has never had EGD. Patient had colonoscopy approximately 10 years ago in [place] and per patient normal. He has had hemorrhoidectomy in past for hemorrhoids.   Wt Readings from Last 3 Encounters:  06/17/23 149 lb 2 oz (67.6 kg)  06/12/23 146 lb (66.2 kg)  05/08/23 157 lb 3.2 oz (71.3 kg)    Past Medical History:  Diagnosis Date   Acute bursitis of left shoulder 01/04/2020   Acute on chronic diastolic congestive heart failure (HCC) 11/13/2021   Last Assessment & Plan:  Formatting of this note is different from the original. Pertinent Data:   Current medication includes: furosemide - 20 mg lisinopriL - 10 mg metoprolol succinate - 25 mg.  BNP (pg/mL)  Date Value  11/15/2021 433.6 (H)   eGFR (mL/min/1.68m*2)  Date Value  11/15/2021 >60.0    BP Readings from Last 3 Encounters:  11/30/21 (!) 147/82  11/15/21 (!) 109/54  11/13/21 (!) 134/74     Acute respiratory failure with hypoxia (HCC) 03/17/2020   Angina pectoris (HCC) 10/07/2019   Arthritis    Atherosclerosis of aorta (HCC) 09/24/2019   Bilateral foot-drop 01/18/2017   BMI 27.0-27.9,adult 09/24/2019   Coronary artery disease involving native coronary artery of native heart without angina pectoris 11/01/2015   Overview:  Bypass surgery in 1996 Overview:  Overview:  Bypass surgery in 1996  Last Assessment & Plan:  Continue his current regimen.  Follow up as noted.  CVA (cerebral vascular accident) (HCC) 04/03/2016   Last Assessment & Plan:  The patient underwent extensive CVA workup.  MRI is negative.  He does have aortic stenosis by echocardiogram.  Upon review of Care everywhere he is followed by Dr. Bing Matter, cardiologist for this.  Carotid ultrasound negative for hemodynamically significant stenosis.  Lipid panel within normal limits.  He has not had any further episodes of dizziness or nausea vomiting.    Demand ischemia (HCC) 11/14/2021   Last Assessment &  Plan:  Formatting of this note might be different from the original. Troponin flat No anginal symptoms Formatting of this note might be different from the original. Last Assessment & Plan:  Formatting of this note might be different from the original. Troponin flat No anginal symptoms   Dementia (HCC)    Depression    DNR (do not resuscitate) 04/03/2016   Overview:  I had an extensive discussion with the patient on 04/03/2016 regarding his end of life wishes.  He and his daughter agree that do not resuscitate is in keeping with his beliefs.   Dyslipidemia 11/01/2015   Last Assessment & Plan:  Formatting of this note might be different from the original. Cholesterol 136   Triglycerides  83  HDL  50.1  LDL Calculated  69  VLDL Cholesterol Cal  17   Continue statin.   Dyspnea    with activity   Essential hypertension 11/01/2015   Last Assessment & Plan:  Resume home medications.  Follow-up as noted above.   Generalized weakness 03/13/2020   GERD (gastroesophageal reflux disease)    Herpes zoster 05/19/2019   History of aortic stenosis 03/11/2020   History of CVA (cerebrovascular accident) 11/14/2021   Last Assessment & Plan:  Formatting of this note might be different from the original. Neurologically intact upon assessment -continue aspirin, statin   Hypertensive heart disease with heart failure (HCC) 11/01/2015   Last Assessment & Plan:  Resume home medications.  Follow-up as noted above.   Myocardial infarction (HCC)    approx 1994   Neuropathy    Peripheral neuropathy 11/14/2021   Last Assessment & Plan:  Formatting of this note might be different from the original. Bilateral lower extremity cool to touch with diminished sensation Patient reports findings are chronic Continue home dose Lyrica Formatting of this note might be different from the original. Last Assessment & Plan:  Formatting of this note might be different from the original. 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   Thrombocytopenia (HCC) 03/13/2020   Trochanteric bursitis of left hip 01/19/2021    Past Surgical History:  Procedure Laterality Date   CARDIAC CATHETERIZATION     CARDIAC SURGERY     CATARACT EXTRACTION, BILATERAL     COLONOSCOPY     [facility]   CORONARY ARTERY BYPASS GRAFT     CORONARY STENT INTERVENTION N/A 10/07/2019   Procedure: CORONARY STENT INTERVENTION;  Surgeon: Tonny Bollman, MD;  Location: [facility] INVASIVE CV LAB;  Service: Cardiovascular;  Laterality: N/A;   EYE SURGERY  HEMORROIDECTOMY     RIGHT/LEFT HEART CATH AND CORONARY/GRAFT ANGIOGRAPHY N/A 10/07/2019   Procedure: RIGHT/LEFT HEART CATH AND CORONARY/GRAFT ANGIOGRAPHY;  Surgeon: Tonny Bollman, MD;  Location: [facility] INVASIVE CV LAB;  Service: Cardiovascular;  Laterality: N/A;   TEE WITHOUT CARDIOVERSION N/A 10/27/2019   Procedure: TRANSESOPHAGEAL ECHOCARDIOGRAM (TEE);  Surgeon: Kathleene Hazel, MD;  Location: [facility] INVASIVE CV LAB;  Service: Open Heart Surgery;  Laterality: N/A;   TRANSCATHETER AORTIC VALVE REPLACEMENT, TRANSFEMORAL N/A 10/27/2019   Procedure: TRANSCATHETER AORTIC VALVE REPLACEMENT, TRANSFEMORAL;  Surgeon: Kathleene Hazel, MD;  Location: [facility] INVASIVE CV LAB;  Service: Open Heart Surgery;  Laterality: N/A;    Current Outpatient Medications  Medication Sig Dispense Refill   acetaminophen (TYLENOL) 650 MG CR tablet Take 650 mg by mouth every 8 (eight) hours as needed for pain.      albuterol (VENTOLIN HFA) 108 (90 Base) MCG/ACT inhaler Inhale 2 puffs  into the lungs every 2 (two) hours as needed for wheezing or shortness of breath. 6.7 g 6   amLODipine (NORVASC) 5 MG tablet Take 5 mg by mouth daily.     ammonium lactate (LAC-HYDRIN) 12 % lotion Apply 1 Application topically 2 (two) times daily. Apply twice daily to arms and hands to increase moisture and reduce easy bruising.  Can also be used safely on neck, chest, and legs as needed 400 g 0   ascorbic acid (VITAMIN C) 500 MG tablet Take 1 tablet (500 mg total) by mouth daily. 30 tablet 0   aspirin EC 81 MG tablet Take 1 tablet (81 mg total) by mouth daily. Swallow whole. 90 tablet 1   carbidopa-levodopa (SINEMET) 25-100 MG tablet Take 1 tablet by mouth at bedtime as needed (rESTLESS LEGS.). 90 tablet 1   carvedilol (COREG) 25 MG tablet Take 1 tablet (25 mg total) by mouth 2 (two) times daily. 60 tablet 3   cholecalciferol (VITAMIN D3) 25 MCG (1000 UNIT) tablet Take 1 tablet (1,000 Units total) by mouth daily. 90 tablet 1   ezetimibe (ZETIA) 10 MG tablet Take 1 tablet (10 mg total) by mouth daily. 90 tablet 1   fluorometholone (FML) 0.1 % ophthalmic suspension Place 1 drop into both eyes daily.     gabapentin (NEURONTIN) 300 MG capsule Take 1 capsule (300 mg total) by mouth 3 (three) times daily. 90 capsule 0   meclizine (ANTIVERT) 25 MG tablet Take 1 tablet (25 mg total) by mouth every 6 (six) hours. 30 tablet 1   neomycin-polymyxin b-dexamethasone (MAXITROL) 3.5-10000-0.1 SUSP Place 1 drop into both eyes every 6 (six) hours.     olmesartan (BENICAR) 40 MG tablet Take 1 tablet (40 mg total) by mouth daily. 90 tablet 1   pantoprazole (PROTONIX) 40 MG tablet Take 1 tablet (40 mg total) by mouth daily. 100 tablet 1   rosuvastatin (CRESTOR) 20 MG tablet Take 1 tablet (20 mg total) by mouth at bedtime. 90 tablet 3   sertraline (ZOLOFT) 50 MG tablet Take 1 tablet (50 mg total) by mouth daily. 90 tablet 1   spironolactone (ALDACTONE) 25 MG tablet Take 1 tablet (25 mg total) by mouth once daily 30  tablet 3   vitamin B-12 (CYANOCOBALAMIN) 500 MCG tablet Take 500 mcg by mouth daily.     zinc sulfate 220 (50 Zn) MG capsule Take 1 capsule (220 mg total) by mouth daily. 90 capsule 1   furosemide (LASIX) 80 MG tablet Take 1 tablet (80 mg total) by mouth daily. 90 tablet 3   nitroGLYCERIN (  NITROSTAT) 0.4 MG SL tablet Place 1 tablet (0.4 mg total) under the tongue every 5 (five) minutes as needed for chest pain. 25 tablet 2   potassium chloride (KLOR-CON) 10 MEQ tablet Take 1 tablet (10 mEq total) by mouth daily. 90 tablet 3   No current facility-administered medications for this visit.    Allergies as of 06/17/2023 - Review Complete 06/12/2023  Allergen Reaction Noted   Poison oak extract Rash 06/17/2019    Family History  Problem Relation Age of Onset   Cancer Mother        bone   Diabetes Mother    Kidney disease Mother    Stroke Mother    Sleep disorder Mother    Arthritis Father    Migraines Father    Hypertension Father    Sleep disorder Father    Arthritis Brother    Cancer Brother        throat   Throat cancer Daughter    Review of Systems:    Constitutional: No weight loss, fever, chills, weakness or fatigue HEENT: Eyes: No change in vision               Ears, Nose, Throat:  No change in hearing or congestion Skin: No rash or itching Cardiovascular: No chest pain, chest pressure or palpitations   Respiratory: No SOB or cough Gastrointestinal: See HPI and otherwise negative Genitourinary: No dysuria or change in urinary frequency Neurological: No headache, dizziness or syncope Musculoskeletal: No new muscle or joint pain Hematologic: No bleeding or bruising Psychiatric: No history of depression or anxiety    Physical Exam:  Vital signs: BP 96/60   Pulse (!) 58   Ht 5\' 5"  (1.651 m)   Wt 149 lb 2 oz (67.6 kg)   BMI 24.82 kg/m   Constitutional: Pleasant  male appears to be in NAD, frail, alert and cooperative Throat: Oral cavity and pharynx without  inflammation, swelling or lesion.  Respiratory: Respirations even and unlabored. Lungs clear to auscultation bilaterally.   No wheezes, crackles, or rhonchi.  Cardiovascular: Normal S1, S2. Regular rate and rhythm. No peripheral edema, cyanosis or pallor.  Gastrointestinal:  Soft, nondistended, nontender. No rebound or guarding. Hypoactive bowel sounds. No appreciable masses or hepatomegaly. Rectal:  Declined. Anoscopy: Declined Msk:  Symmetrical without gross deformities. Arthritis in right hand. Neurologic:  Alert and  oriented x4;  grossly normal neurologically. Numbness and pain to right hand. Stroke affected left hand , weakness. Uses cane. Skin:   Dry and intact without significant lesions or rashes. Psychiatric: Oriented to person, place and time. Demonstrates good judgement and reason without abnormal affect or behaviors.  RELEVANT LABS AND IMAGING: CBC    Latest Ref Rng & Units 05/08/2023   10:44 AM 03/29/2023    7:02 PM 03/29/2023    6:57 PM  CBC  WBC 3.4 - 10.8 x10E3/uL 15.4  18.4    Hemoglobin 13.0 - 17.7 g/dL 78.4  69.6  29.5   Hematocrit 37.5 - 51.0 % 32.2  38.4  36.0   Platelets 150 - 450 x10E3/uL 328  388       CMP     Latest Ref Rng & Units 05/08/2023   10:44 AM 03/29/2023    7:02 PM 03/29/2023    6:57 PM  CMP  Glucose 70 - 99 mg/dL 80  284  132   BUN 10 - 36 mg/dL 18  22  22    Creatinine 0.76 - 1.27 mg/dL 4.40  1.02  7.25  Sodium 134 - 144 mmol/L 141  141  138   Potassium 3.5 - 5.2 mmol/L 4.3  3.7  3.6   Chloride 96 - 106 mmol/L 102  103  105   CO2 20 - 29 mmol/L 20  24    Calcium 8.6 - 10.2 mg/dL 9.1  9.3    Total Protein 6.0 - 8.5 g/dL 6.0  6.7    Total Bilirubin 0.0 - 1.2 mg/dL 1.2  1.4    Alkaline Phos 44 - 121 IU/L 135  88    AST 0 - 40 IU/L 14  18    ALT 0 - 44 IU/L 10  16    11/23/22 echo-  Left ventricular ejection fraction, by estimation, is 50 to 55%.  03/29/2023 CT angio head neck with and without contrast CTA: 1. No emergent proximal large vessel  occlusion. Limited distal vessel evaluation due to delayed/venous contrast bolus timing. 2. Severe left P2 PCA stenosis. 3. Severe stenosis of the right vertebral artery is dural margin. 4. Approximately 50% stenosis of the proximal left ICA in the neck. 5.  Aortic Atherosclerosis (ICD10-I70.0).  03/30/2023 MR brain without contrast IMPRESSION: No evidence of acute intracranial abnormality.  Assessment: Encounter Diagnoses  Name Primary?   Loss of weight Yes   Positive fecal occult blood test    Chronic constipation    Anemia, unspecified type    Loss of appetite    Gastroesophageal reflux disease, unspecified whether esophagitis present    Family history of esophageal cancer    Rectal bleeding       89 year old male patient that presents for positive fecal occult and 2 point drop in hemoglobin, 12.6 in January 2025, currently Hgb 10.4. Patient notes intermittent episodes of rectal bleeding over extended period of time, history of hemorrhoids but states they took care of them with surgical removal. Last colonoscopy over 10 years ago. He does has issues with constipation and we discussed adding more fiber into his daily regimen. He also has had poor appetite and lost 10lbs in past month.  Hx of GERD and currently taking PPI therapy. Denies dysphagia. Family history of esophageal CA in his daughter. Appt tomorrow with Hem/Onc for evaluation.  Given his age and recent TIA (per son in past month) will schedule upper GI endoscopy and colonoscopy at hospital. He would like to proceed with endoscopic procedures.  Plan: -Recommend Citrucel 1 tsp po daily -Recommend Fiber one cereal -Reinforced Ensures BID-TID po daily.  -Follow-up Hematology/Oncology appointment tomorrow  -Schedule EGD in hospital with Dr. Tomasa Rand. The risks and benefits of EGD with possible biopsies and esophageal dilation were discussed with the patient who agrees to proceed.  - Schedule  a colonoscopy with Dr. Tomasa Rand .  The risks and benefits of colonoscopy with possible polypectomy / biopsies were discussed and the patient agrees to proceed.  -Advised to go to the ER if there is any severe weakness, severe abdominal pain, vomit blood, dark red blood in your bowel movement, shortness of breath or chest pain.    Thank you for the courtesy of this consult. Please call me with any questions or concerns.   Millan Legan, FNP-C [facility] 06/17/2023, 2:12 PM  Cc: Langley Gauss, PA

## 2023-06-18 ENCOUNTER — Inpatient Hospital Stay

## 2023-06-18 ENCOUNTER — Telehealth: Payer: Self-pay | Admitting: Hematology and Oncology

## 2023-06-18 ENCOUNTER — Encounter: Payer: Self-pay | Admitting: Hematology and Oncology

## 2023-06-18 ENCOUNTER — Inpatient Hospital Stay: Attending: Hematology and Oncology | Admitting: Hematology and Oncology

## 2023-06-18 ENCOUNTER — Other Ambulatory Visit: Payer: Self-pay

## 2023-06-18 VITALS — BP 113/86 | HR 83 | Temp 98.1°F | Resp 22 | Ht 64.0 in | Wt 148.9 lb

## 2023-06-18 DIAGNOSIS — G629 Polyneuropathy, unspecified: Secondary | ICD-10-CM | POA: Diagnosis not present

## 2023-06-18 DIAGNOSIS — R0602 Shortness of breath: Secondary | ICD-10-CM | POA: Insufficient documentation

## 2023-06-18 DIAGNOSIS — M25511 Pain in right shoulder: Secondary | ICD-10-CM | POA: Insufficient documentation

## 2023-06-18 DIAGNOSIS — D649 Anemia, unspecified: Secondary | ICD-10-CM | POA: Insufficient documentation

## 2023-06-18 DIAGNOSIS — M25431 Effusion, right wrist: Secondary | ICD-10-CM | POA: Diagnosis not present

## 2023-06-18 DIAGNOSIS — K5900 Constipation, unspecified: Secondary | ICD-10-CM | POA: Diagnosis not present

## 2023-06-18 DIAGNOSIS — Z952 Presence of prosthetic heart valve: Secondary | ICD-10-CM | POA: Insufficient documentation

## 2023-06-18 DIAGNOSIS — Z87891 Personal history of nicotine dependence: Secondary | ICD-10-CM | POA: Diagnosis not present

## 2023-06-18 DIAGNOSIS — Z8673 Personal history of transient ischemic attack (TIA), and cerebral infarction without residual deficits: Secondary | ICD-10-CM | POA: Diagnosis not present

## 2023-06-18 DIAGNOSIS — R61 Generalized hyperhidrosis: Secondary | ICD-10-CM | POA: Insufficient documentation

## 2023-06-18 DIAGNOSIS — Z951 Presence of aortocoronary bypass graft: Secondary | ICD-10-CM | POA: Diagnosis not present

## 2023-06-18 DIAGNOSIS — M25512 Pain in left shoulder: Secondary | ICD-10-CM | POA: Insufficient documentation

## 2023-06-18 DIAGNOSIS — D72829 Elevated white blood cell count, unspecified: Secondary | ICD-10-CM

## 2023-06-18 DIAGNOSIS — R059 Cough, unspecified: Secondary | ICD-10-CM | POA: Insufficient documentation

## 2023-06-18 DIAGNOSIS — Z800 Family history of malignant neoplasm of digestive organs: Secondary | ICD-10-CM | POA: Diagnosis not present

## 2023-06-18 DIAGNOSIS — K625 Hemorrhage of anus and rectum: Secondary | ICD-10-CM | POA: Insufficient documentation

## 2023-06-18 DIAGNOSIS — D75839 Thrombocytosis, unspecified: Secondary | ICD-10-CM | POA: Diagnosis not present

## 2023-06-18 LAB — CBC WITH DIFFERENTIAL (CANCER CENTER ONLY)
Abs Immature Granulocytes: 0.23 10*3/uL — ABNORMAL HIGH (ref 0.00–0.07)
Basophils Absolute: 0.2 10*3/uL — ABNORMAL HIGH (ref 0.0–0.1)
Basophils Relative: 1 %
Eosinophils Absolute: 0.6 10*3/uL — ABNORMAL HIGH (ref 0.0–0.5)
Eosinophils Relative: 3 %
HCT: 32.2 % — ABNORMAL LOW (ref 39.0–52.0)
Hemoglobin: 10.3 g/dL — ABNORMAL LOW (ref 13.0–17.0)
Immature Granulocytes: 1 %
Lymphocytes Relative: 6 %
Lymphs Abs: 1 10*3/uL (ref 0.7–4.0)
MCH: 27.7 pg (ref 26.0–34.0)
MCHC: 32 g/dL (ref 30.0–36.0)
MCV: 86.6 fL (ref 80.0–100.0)
Monocytes Absolute: 0.5 10*3/uL (ref 0.1–1.0)
Monocytes Relative: 3 %
Neutro Abs: 14.6 10*3/uL — ABNORMAL HIGH (ref 1.7–7.7)
Neutrophils Relative %: 86 %
Platelet Count: 484 10*3/uL — ABNORMAL HIGH (ref 150–400)
RBC: 3.72 MIL/uL — ABNORMAL LOW (ref 4.22–5.81)
RDW: 18.2 % — ABNORMAL HIGH (ref 11.5–15.5)
WBC Count: 17.1 10*3/uL — ABNORMAL HIGH (ref 4.0–10.5)
nRBC: 0 % (ref 0.0–0.2)
nRBC: 0 /100{WBCs}

## 2023-06-18 LAB — FERRITIN: Ferritin: 201 ng/mL (ref 24–336)

## 2023-06-18 LAB — CMP (CANCER CENTER ONLY)
ALT: <5 U/L (ref 0–44)
AST: 16 U/L (ref 15–41)
Albumin: 3.8 g/dL (ref 3.5–5.0)
Alkaline Phosphatase: 127 U/L — ABNORMAL HIGH (ref 38–126)
Anion gap: 16 — ABNORMAL HIGH (ref 5–15)
BUN: 40 mg/dL — ABNORMAL HIGH (ref 8–23)
CO2: 22 mmol/L (ref 22–32)
Calcium: 9.5 mg/dL (ref 8.9–10.3)
Chloride: 103 mmol/L (ref 98–111)
Creatinine: 1.7 mg/dL — ABNORMAL HIGH (ref 0.61–1.24)
GFR, Estimated: 38 mL/min — ABNORMAL LOW (ref >60)
Glucose, Bld: 125 mg/dL — ABNORMAL HIGH (ref 70–99)
Potassium: 3.6 mmol/L (ref 3.5–5.1)
Sodium: 141 mmol/L (ref 135–145)
Total Bilirubin: 0.6 mg/dL (ref 0.0–1.2)
Total Protein: 6.4 g/dL — ABNORMAL LOW (ref 6.5–8.1)

## 2023-06-18 LAB — RETICULOCYTES
Immature Retic Fract: 22.3 % — ABNORMAL HIGH (ref 2.3–15.9)
RBC.: 3.75 MIL/uL — ABNORMAL LOW (ref 4.22–5.81)
Retic Count, Absolute: 81.4 10*3/uL (ref 19.0–186.0)
Retic Ct Pct: 2.2 % (ref 0.4–3.1)

## 2023-06-18 LAB — FOLATE: Folate: 10.6 ng/mL (ref >5.9)

## 2023-06-18 LAB — LACTATE DEHYDROGENASE: LDH: 535 U/L — ABNORMAL HIGH (ref 98–192)

## 2023-06-18 LAB — IRON AND TIBC
Iron: 31 ug/dL — ABNORMAL LOW (ref 45–182)
Saturation Ratios: 11 % — ABNORMAL LOW (ref 17.9–39.5)
TIBC: 279 ug/dL (ref 250–450)
UIBC: 248 ug/dL

## 2023-06-18 LAB — SAMPLE TO BLOOD BANK

## 2023-06-18 LAB — TECHNOLOGIST SMEAR REVIEW

## 2023-06-18 LAB — VITAMIN B12: Vitamin B-12: 2821 pg/mL — ABNORMAL HIGH (ref 180–914)

## 2023-06-18 NOTE — Progress Notes (Unsigned)
 [facility] [address] [place],  [place]  [address] [phone]  Clinic Day:  06/18/2023   Referring physician: Langley Gauss, PA  Patient Care Team: Patient Care Team: Jack Hodges, [place] as PCP - General (Physician Assistant) Georgeanna Lea, MD as PCP - Cardiology (Cardiology)   REASON FOR CONSULTATION:  Abnormal labs  HISTORY OF PRESENT ILLNESS:  Jack Hodges is a 89 y.o. male with granulocytosis and worsening anemia who is referred in consultation by Jack Gauss, PA-C for assessment and management.  The patient was seen on March 19 for routine follow-up and he reported bright red blood per rectum.  Labs at [facility] that day revealed white count 18.6, 87% neutrophils, 6% lymphocytes, 3% monocytes, 3% eosinophils and 1% basophils.  Hemoglobin 10.8 with an MCV of 83.  Platelets 458,000.  Serum iron 37, TIBC 242 and iron saturation 15%.  CMP was unremarkable except for potassium 3.3, creatinine 1.4, BUN 31, total protein 6.1, and glucose 140.  Review of his records reveals he had granulocytosis and anemia beginning in September 2024. His hemoglobin dropped from 12.6 in January to 10.1 in February. 2 out of 3 stool Hemoccults were positive. He has been referred to GI and is scheduled for EGD and colonoscopy April 10.  The patient's main complaint is neuropathy of the hands and feet, with increased pain and swelling of the right wrist and hand.  He reports fatigue.  He denies pica to ice.  He reports chronic constipation with mild rectal bleeding.  He has issues with memory.  He has shortness of breath with exertion and palpitations, but denies chest pain.  He has had a decreased appetite, with a 10 pound weight loss. He also reports easy bruising. He states his aspirin was discontinued about 2 weeks ago. He reports previously taking iron supplements.  He has previously undergone hemorrhoidectomy.  He reports bilateral shoulder pain, and states he had an injection  1-2 months ago. He denies other steroid use.  His granddaughter accompanies him today and states he has been referred to and orthopedic surgeon for the right wrist/hand pain.   Past medical history: Dementia, hypertension, congestive heart failure, chronic atrial fibrillation, hyperlipidemia, coronary artery disease, sleep apnea, osteoarthritis, bilateral drop foot, peripheral neuropathy, restless legs.  History of CVA, COVID x 2.  Status post CABG, coronary artery stenting, aortic valve replacement, hemorrhoidectomy, bilateral cataract surgery.  Social history: Former smoker and drinker, quit 60 years ago. Past 3 pack per day smoker. Previous heavy alcohol 6 beers per day. Widowed with 3 children, one deceased. Retired Naval architect.  Family history: There is no known family history of hematologic malignancy. A brother had throat cancer. His daughter had throat cancer.  REVIEW OF SYSTEMS:  Review of Systems  Constitutional:  Positive for diaphoresis (occasional, mild about the head and neck). Negative for appetite change, chills, fatigue, fever and unexpected weight change.  HENT:   Negative for lump/mass, mouth sores, nosebleeds, sore throat and trouble swallowing.   Respiratory:  Positive for cough (occasional, productive of clear sputum) and shortness of breath (with exertion). Negative for hemoptysis.   Cardiovascular:  Negative for chest pain and leg swelling.  Gastrointestinal:  Positive for constipation. Negative for abdominal pain, diarrhea, nausea and vomiting.  Genitourinary:  Negative for difficulty urinating, dysuria, frequency and hematuria.   Musculoskeletal:  Positive for arthralgias (shoulders and hips), back pain, gait problem (walks with cane) and neck pain. Negative for myalgias.  Skin:  Negative for  itching, rash and wound.  Neurological:  Positive for dizziness (intermittent vertigo) and gait problem (walks with cane). Negative for extremity weakness, headaches,  light-headedness and numbness.  Hematological:  Negative for adenopathy. Bruises/bleeds easily.  Psychiatric/Behavioral:  Negative for depression and sleep disturbance. The patient is not nervous/anxious.      VITALS:  Blood pressure 113/86, pulse 83, temperature 98.1 F (36.7 C), temperature source Oral, resp. rate (!) 22, height 5\' 4"  (1.626 m), weight 148 lb 14.4 oz (67.5 kg), SpO2 100%.  Wt Readings from Last 3 Encounters:  06/18/23 148 lb 14.4 oz (67.5 kg)  06/17/23 149 lb 2 oz (67.6 kg)  06/12/23 146 lb (66.2 kg)    Body mass index is 25.56 kg/m.  Performance status (ECOG): 3 - Symptomatic, >50% confined to bed  PHYSICAL EXAM:  Physical Exam Vitals and nursing note reviewed.  Constitutional:      General: He is not in acute distress.    Appearance: Normal appearance. He is normal weight.  HENT:     Head: Normocephalic and atraumatic.     Mouth/Throat:     Mouth: Mucous membranes are moist.     Pharynx: Oropharynx is clear. No oropharyngeal exudate or posterior oropharyngeal erythema.  Eyes:     General: No scleral icterus.    Extraocular Movements: Extraocular movements intact.     Conjunctiva/sclera: Conjunctivae normal.     Pupils: Pupils are equal, round, and reactive to light.  Cardiovascular:     Rate and Rhythm: Normal rate and regular rhythm.     Heart sounds: Normal heart sounds. No murmur heard.    No friction rub. No gallop.  Pulmonary:     Effort: Pulmonary effort is normal.     Breath sounds: Normal breath sounds. No wheezing, rhonchi or rales.  Abdominal:     General: Bowel sounds are normal. There is no distension.     Palpations: Abdomen is soft. There is no mass.     Tenderness: There is no abdominal tenderness.  Musculoskeletal:        General: Normal range of motion.     Cervical back: Normal range of motion and neck supple. No tenderness.     Right lower leg: No edema.     Left lower leg: No edema.  Lymphadenopathy:     Cervical: No cervical  adenopathy.     Upper Body:     Right upper body: No supraclavicular or axillary adenopathy.     Left upper body: No supraclavicular or axillary adenopathy.     Lower Body: No right inguinal adenopathy. No left inguinal adenopathy.  Skin:    General: Skin is warm and dry.     Coloration: Skin is not jaundiced.     Findings: No rash.  Neurological:     Mental Status: He is alert and oriented to person, place, and time.     Cranial Nerves: No cranial nerve deficit.  Psychiatric:        Mood and Affect: Mood normal.        Behavior: Behavior normal.        Thought Content: Thought content normal.      LABS:      Latest Ref Rng & Units 06/18/2023    1:50 PM 05/08/2023   10:44 AM 03/29/2023    7:02 PM  CBC  WBC 4.0 - 10.5 K/uL 17.1  15.4  18.4   Hemoglobin 13.0 - 17.0 g/dL 60.4  54.0  98.1   Hematocrit 39.0 -  52.0 % 32.2  32.2  38.4   Platelets 150 - 400 K/uL 484  328  388       Latest Ref Rng & Units 06/18/2023    1:50 PM 05/08/2023   10:44 AM 03/29/2023    7:02 PM  CMP  Glucose 70 - 99 mg/dL 829  80  562   BUN 8 - 23 mg/dL 40  18  22   Creatinine 0.61 - 1.24 mg/dL 1.30  8.65  7.84   Sodium 135 - 145 mmol/L 141  141  141   Potassium 3.5 - 5.1 mmol/L 3.6  4.3  3.7   Chloride 98 - 111 mmol/L 103  102  103   CO2 22 - 32 mmol/L 22  20  24    Calcium 8.9 - 10.3 mg/dL 9.5  9.1  9.3   Total Protein 6.5 - 8.1 g/dL 6.4  6.0  6.7   Total Bilirubin 0.0 - 1.2 mg/dL 0.6  1.2  1.4   Alkaline Phos 38 - 126 U/L 127  135  88   AST 15 - 41 U/L 16  14  18    ALT 0 - 44 U/L 5  10  16        Lab Results  Component Value Date   TIBC 288 11/30/2022   TIBC 221 (L) 03/16/2020   FERRITIN 168 11/30/2022   FERRITIN 459 (H) 03/17/2020   FERRITIN 506 (H) 03/16/2020   IRONPCTSAT 18 11/30/2022   IRONPCTSAT 40 (H) 03/16/2020   Lab Results  Component Value Date   LDH 306 (H) 03/17/2020   LDH 327 (H) 03/16/2020   LDH 391 (H) 03/13/2020    STUDIES:  No results found.    HISTORY:   Past  Medical History:  Diagnosis Date   Acute bursitis of left shoulder 01/04/2020   Acute on chronic diastolic congestive heart failure (HCC) 11/13/2021   Last Assessment & Plan:  Formatting of this note is different from the original. Pertinent Data:   Current medication includes: furosemide - 20 mg lisinopriL - 10 mg metoprolol succinate - 25 mg.  BNP (pg/mL)  Date Value  11/15/2021 433.6 (H)   eGFR (mL/min/1.71m*2)  Date Value  11/15/2021 >60.0    BP Readings from Last 3 Encounters:  11/30/21 (!) 147/82  11/15/21 (!) 109/54  11/13/21 (!) 134/74     Acute respiratory failure with hypoxia (HCC) 03/17/2020   Angina pectoris (HCC) 10/07/2019   Arthritis    Atherosclerosis of aorta (HCC) 09/24/2019   Bilateral foot-drop 01/18/2017   BMI 27.0-27.9,adult 09/24/2019   Coronary artery disease involving native coronary artery of native heart without angina pectoris 11/01/2015   Overview:  Bypass surgery in 1996 Overview:  Overview:  Bypass surgery in 1996  Last Assessment & Plan:  Continue his current regimen.  Follow up as noted.   CVA (cerebral vascular accident) (HCC) 04/03/2016   Last Assessment & Plan:  The patient underwent extensive CVA workup.  MRI is negative.  He does have aortic stenosis by echocardiogram.  Upon review of Care everywhere he is followed by Dr. Bing Matter, cardiologist for this.  Carotid ultrasound negative for hemodynamically significant stenosis.  Lipid panel within normal limits.  He has not had any further episodes of dizziness or nausea vomiting.    Demand ischemia (HCC) 11/14/2021   Last Assessment & Plan:  Formatting of this note might be different from the original. Troponin flat No anginal symptoms Formatting of this note might be different from the original.  Last Assessment & Plan:  Formatting of this note might be different from the original. Troponin flat No anginal symptoms   Dementia (HCC)    Depression    DNR (do not resuscitate) 04/03/2016   Overview:  I had an  extensive discussion with the patient on 04/03/2016 regarding his end of life wishes.  He and his daughter agree that do not resuscitate is in keeping with his beliefs.   Dyslipidemia 11/01/2015   Last Assessment & Plan:  Formatting of this note might be different from the original. Cholesterol 136   Triglycerides  83  HDL  50.1  LDL Calculated  69  VLDL Cholesterol Cal  17   Continue statin.   Dyspnea    with activity   Essential hypertension 11/01/2015   Last Assessment & Plan:  Resume home medications.  Follow-up as noted above.   Generalized weakness 03/13/2020   GERD (gastroesophageal reflux disease)    Herpes zoster 05/19/2019   History of aortic stenosis 03/11/2020   History of CVA (cerebrovascular accident) 11/14/2021   Last Assessment & Plan:  Formatting of this note might be different from the original. Neurologically intact upon assessment -continue aspirin, statin   Hypertensive heart disease with heart failure (HCC) 11/01/2015   Last Assessment & Plan:  Resume home medications.  Follow-up as noted above.   Myocardial infarction (HCC)    approx 1994   Neuropathy    Peripheral neuropathy 11/14/2021   Last Assessment & Plan:  Formatting of this note might be different from the original. Bilateral lower extremity cool to touch with diminished sensation Patient reports findings are chronic Continue home dose Lyrica Formatting of this note might be different from the original. Last Assessment & Plan:  Formatting of this note might be different from the original. 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   Thrombocytopenia (HCC) 03/13/2020   Trochanteric bursitis of left hip 01/19/2021    Past Surgical History:  Procedure Laterality Date   CARDIAC CATHETERIZATION     CARDIAC SURGERY     CATARACT EXTRACTION, BILATERAL     COLONOSCOPY     [facility]   CORONARY ARTERY BYPASS GRAFT     CORONARY STENT INTERVENTION N/A 10/07/2019   Procedure: CORONARY STENT INTERVENTION;  Surgeon: Tonny Bollman, MD;  Location: [facility] INVASIVE CV LAB;  Service: Cardiovascular;  Laterality: N/A;   EYE SURGERY     HEMORROIDECTOMY     RIGHT/LEFT HEART CATH AND CORONARY/GRAFT ANGIOGRAPHY N/A 10/07/2019   Procedure: RIGHT/LEFT HEART CATH AND CORONARY/GRAFT ANGIOGRAPHY;  Surgeon: Tonny Bollman, MD;  Location: [facility] INVASIVE CV LAB;  Service: Cardiovascular;  Laterality: N/A;   TEE WITHOUT CARDIOVERSION N/A 10/27/2019   Procedure: TRANSESOPHAGEAL ECHOCARDIOGRAM (TEE);  Surgeon: Kathleene Hazel, MD;  Location: [facility] INVASIVE CV LAB;  Service: Open Heart Surgery;  Laterality: N/A;   TRANSCATHETER AORTIC VALVE REPLACEMENT, TRANSFEMORAL N/A 10/27/2019   Procedure: TRANSCATHETER AORTIC VALVE REPLACEMENT, TRANSFEMORAL;  Surgeon: Kathleene Hazel, MD;  Location: [facility] INVASIVE CV LAB;  Service: Open Heart Surgery;  Laterality: N/A;  Family History  Problem Relation Age of Onset   Cancer Mother        bone   Diabetes Mother    Kidney disease Mother    Stroke Mother    Sleep disorder Mother    Arthritis Father    Migraines Father    Hypertension Father    Sleep disorder Father    Arthritis Brother    Cancer Brother        throat   Throat cancer Daughter     Social History:  reports that he quit smoking about 61 years ago. His smoking use included cigarettes and cigars. He has never used smokeless tobacco. He reports that he does not drink alcohol and does not use drugs.The patient is accompanied by his granddaughter today.  Allergies:   Allergies  Allergen Reactions   Poison Oak Extract Rash    Current Medications: Current Outpatient Medications  Medication Sig Dispense Refill   acetaminophen (TYLENOL) 650 MG CR tablet Take 650 mg by mouth every 8 (eight) hours as needed for pain.      albuterol (VENTOLIN HFA) 108 (90 Base) MCG/ACT inhaler Inhale 2 puffs into the lungs every 2 (two) hours as needed for wheezing or shortness of breath. (Patient not taking: Reported on 06/18/2023) 6.7 g 6   amLODipine (NORVASC) 5 MG tablet Take 5 mg by mouth daily.     ammonium lactate (LAC-HYDRIN) 12 % lotion Apply 1 Application topically 2 (two) times daily. Apply twice daily to arms and hands to increase moisture and reduce easy bruising.  Can also be used safely on neck, chest, and legs as needed 400 g 0   ascorbic acid (VITAMIN C) 500 MG tablet Take 1 tablet (500 mg total) by mouth daily. 30 tablet 0   aspirin EC 81 MG tablet Take 1 tablet (81 mg total) by mouth daily. Swallow whole. (Patient not taking: Reported on 06/18/2023) 90 tablet 1   carbidopa-levodopa (SINEMET) 25-100 MG tablet Take 1 tablet by mouth at bedtime as needed (rESTLESS LEGS.). 90 tablet 1   carvedilol (COREG) 25 MG tablet Take 1 tablet (25 mg total) by mouth 2 (two) times daily. 60 tablet 3   cholecalciferol (VITAMIN D3) 25 MCG (1000 UNIT) tablet Take 1 tablet (1,000 Units total) by mouth daily. 90 tablet 1   ezetimibe (ZETIA) 10 MG tablet Take 1 tablet (10 mg total) by mouth daily. 90 tablet 1   fluorometholone (FML) 0.1 % ophthalmic suspension Place 1 drop into both eyes daily. (Patient not taking: Reported on 06/18/2023)     furosemide (LASIX) 80 MG tablet Take 1 tablet (80 mg total) by mouth daily. 90 tablet 3   gabapentin (NEURONTIN) 300 MG capsule Take 1 capsule (300 mg total) by mouth 3 (three) times daily. (Patient not taking: Reported on 06/18/2023) 90 capsule 0   meclizine (ANTIVERT) 25 MG tablet Take 1 tablet (25 mg total) by mouth every 6 (six) hours. (Patient  not taking: Reported on 06/18/2023) 30 tablet 1   neomycin-polymyxin b-dexamethasone (MAXITROL) 3.5-10000-0.1 SUSP Place 1 drop into both eyes every 6 (six) hours. (Patient not taking: Reported on 06/18/2023)     nitroGLYCERIN (NITROSTAT) 0.4 MG SL tablet Place 1 tablet (0.4 mg total) under the tongue every 5 (five) minutes as needed for chest pain. 25 tablet 2   olmesartan (BENICAR) 40 MG tablet Take 1 tablet (40 mg total) by mouth daily. 90 tablet 1   pantoprazole (PROTONIX) 40 MG tablet Take 1 tablet (40 mg total)  by mouth daily. 100 tablet 1   potassium chloride (KLOR-CON) 10 MEQ tablet Take 1 tablet (10 mEq total) by mouth daily. 90 tablet 3   rosuvastatin (CRESTOR) 20 MG tablet Take 1 tablet (20 mg total) by mouth at bedtime. 90 tablet 3   sertraline (ZOLOFT) 50 MG tablet Take 1 tablet (50 mg total) by mouth daily. (Patient not taking: Reported on 06/18/2023) 90 tablet 1   Sodium Sulfate-Mag Sulfate-KCl (SUTAB) [phone] MG TABS Use as directed for colonoscopy. MANUFACTURER CODES!! BIN: [id number] PCN: CN GROUP: [id number] MEMBER ID: [id number];VHQ AS SECONDARY INSURANCE ;NO PRIOR AUTHORIZATION (Patient not taking: Reported on 06/18/2023) 24 tablet 0   spironolactone (ALDACTONE) 25 MG tablet Take 1 tablet (25 mg total) by mouth once daily 30 tablet 3   vitamin B-12 (CYANOCOBALAMIN) 500 MCG tablet Take 500 mcg by mouth daily.     zinc sulfate 220 (50 Zn) MG capsule Take 1 capsule (220 mg total) by mouth daily. (Patient not taking: Reported on 06/18/2023) 90 capsule 1   No current facility-administered medications for this visit.     ASSESSMENT & PLAN:   Assessment/Plan:  Marlowe W Hua is a 89 y.o. male with granulocytosis and anemia. He now has mild thrombocytosis which can be seen in iron deficiency.  Iron studies were equivocal, so he may be iron deficient due to chronic GI blood loss.  He is scheduled for EGD and colonoscopy. Possible causes of the granulocytosis include intermittent steroid  injections, infection, inflammation, malignancy/myeloproliferative neoplasm. Further evaluation of both the granulocytosis and anemia is pending from today.  I will plan to see him back in 2 weeks to review the results.  I discussed the assessment and plan with the patient and his granddaughter. They were provided an opportunity to ask questions and all were answered.  The patient agreed with the plan and demonstrated an understanding of the instructions.    Thank you for the referral.    45 minutes was spent in patient care.  This included time spent preparing to see the patient (e.g., review of tests), obtaining and/or reviewing separately obtained history, counseling and educating the patient/family/caregiver, ordering medications, tests, or procedures; documenting clinical information in the electronic or other health record, independently interpreting results and communicating results to the patient/family/caregiver as well as coordination of care.      Adah Perl, PA-C   Physician Assistant [facility] [phone]

## 2023-06-18 NOTE — Telephone Encounter (Signed)
 06/18/23 Spoke with patient and confirmed next appt.

## 2023-06-19 ENCOUNTER — Telehealth: Payer: Self-pay

## 2023-06-19 ENCOUNTER — Inpatient Hospital Stay

## 2023-06-19 DIAGNOSIS — D72829 Elevated white blood cell count, unspecified: Secondary | ICD-10-CM

## 2023-06-19 DIAGNOSIS — D75839 Thrombocytosis, unspecified: Secondary | ICD-10-CM

## 2023-06-19 DIAGNOSIS — D649 Anemia, unspecified: Secondary | ICD-10-CM | POA: Diagnosis not present

## 2023-06-19 LAB — HAPTOGLOBIN: Haptoglobin: 143 mg/dL (ref 38–329)

## 2023-06-19 NOTE — Telephone Encounter (Signed)
 Detailed message left for April. Scheduling aware.

## 2023-06-19 NOTE — Telephone Encounter (Signed)
-----   Message from Adah Perl sent at 06/19/2023  8:17 AM EDT ----- Please let his granddaughter know I am sorry but I need another lab test to if he needs iron.  I will put in orders for today, but they can, at their convenience.  Have scheduling give you an appointment please.  Thank you

## 2023-06-20 LAB — SOLUBLE TRANSFERRIN RECEPTOR: Transferrin Receptor: 23.9 nmol/L (ref 12.2–27.3)

## 2023-06-21 ENCOUNTER — Telehealth: Payer: Self-pay

## 2023-06-21 LAB — PROTEIN ELECTROPHORESIS, SERUM, WITH REFLEX
A/G Ratio: 1.2 (ref 0.7–1.7)
Albumin ELP: 3 g/dL (ref 2.9–4.4)
Alpha-1-Globulin: 0.4 g/dL (ref 0.0–0.4)
Alpha-2-Globulin: 0.7 g/dL (ref 0.4–1.0)
Beta Globulin: 0.8 g/dL (ref 0.7–1.3)
Gamma Globulin: 0.7 g/dL (ref 0.4–1.8)
Globulin, Total: 2.5 g/dL (ref 2.2–3.9)
Total Protein ELP: 5.5 g/dL — ABNORMAL LOW (ref 6.0–8.5)

## 2023-06-21 NOTE — Telephone Encounter (Signed)
-----   Message from Adah Perl sent at 06/21/2023  7:56 AM EDT ----- Please let his granddaughter know he does not appear to be low on iron. Testing for making too many blood cells is pending. Thanks

## 2023-06-21 NOTE — Telephone Encounter (Signed)
 April notified and voiced understanding.

## 2023-06-24 LAB — BCR-ABL1 FISH
Cells Analyzed: 200
Cells Counted: 200

## 2023-06-25 NOTE — Progress Notes (Signed)
 Agree with the assessment and plan as outlined by [facility], FNP-C. Endoscopic evaluation not unreasonable given 2 point drop in hgb and overt bleeding with pos FOBT, if patient/family willing to proceed and understand risks of sedation in frail, elderly male with multiple comorbidities.     Mckaylin Bastien E. Tomasa Rand, MD [facility] Gastroenterology

## 2023-06-26 ENCOUNTER — Telehealth: Payer: Self-pay | Admitting: Hematology and Oncology

## 2023-06-26 ENCOUNTER — Telehealth: Payer: Self-pay | Admitting: Gastroenterology

## 2023-06-26 LAB — JAK2 V617F RFX CALR/MPL/E12-15: JAK2 V617F %: 61.76 %

## 2023-06-26 NOTE — Telephone Encounter (Signed)
 Procedure:Arshdeep/EGD Procedure date: 07/04/23 Procedure location: [facility] Arrival Time: 6:00 am Spoke with the patient Y/N: Yes Any prep concerns? No  Has the patient obtained the prep from the pharmacy ? Yes Do you have a care partner and transportation: Yes Any additional concerns? NO

## 2023-06-26 NOTE — Telephone Encounter (Signed)
 Telephone granddaughter Jack Hodges to let her know he does have bone marrow problem where he makes too many blood cells, ie hematologic malignancy. 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 Advised her will r/s appt to see Dr. Melvyn Neth next available to discuss further and make recommendations. She expressed understanding.

## 2023-06-27 ENCOUNTER — Telehealth: Payer: Self-pay | Admitting: Hematology and Oncology

## 2023-06-27 NOTE — Telephone Encounter (Signed)
 Patient has been scheduled. Aware of appt date and time.   Scheduling Message Entered by Belva Crome A on 06/26/2023 at  4:50 PM Priority: High <No visit type provided>  Department: [facility]  Provider:  Scheduling Notes:  Patient has new hematologic malignancy. Will need f/u with Dr. Melvyn Neth. Cancel appt with me on 4/11 and schedule with Dr. Melvyn Neth next available. Thanks

## 2023-06-28 ENCOUNTER — Other Ambulatory Visit: Payer: Self-pay

## 2023-06-28 DIAGNOSIS — D649 Anemia, unspecified: Secondary | ICD-10-CM

## 2023-06-30 NOTE — Progress Notes (Unsigned)
 [facility] [address] [place],  [place]  [address] [phone]  Clinic Day:  07/01/2023   Referring physician: Langley Gauss, PA  Patient Care Team: Patient Care Team: Langley Gauss, [place] as PCP - General (Physician Assistant) Georgeanna Lea, MD as PCP - Cardiology (Cardiology)  HISTORY OF PRESENT ILLNESS:  Jack Hodges is a 89 y.o. male who our office recently began seeing for leukocytosis, thrombocytosis, and worsening anemia.   He comes in today to go over all of his recent labs to determine the etiology behind his abnormal peripheral counts.  Since his last visit, the patient has been doing okay.  He has noticed blood in his stool, but has not been recently.  In the recent past, 2 out of 3 stool Hemoccults were positive.  Based upon this, he is scheduled  for both an EGD and colonoscopy on April 10th.  VITALS:  Blood pressure 120/69, pulse 72, temperature 97.7 F (36.5 C), temperature source Oral, resp. rate 16, height 5\' 4"  (1.626 m), SpO2 95%.  Wt Readings from Last 3 Encounters:  06/18/23 148 lb 14.4 oz (67.5 kg)  06/17/23 149 lb 2 oz (67.6 kg)  06/12/23 146 lb (66.2 kg)    Body mass index is 25.56 kg/m.  Performance status (ECOG): 3 - Symptomatic, >50% confined to bed  PHYSICAL EXAM:  Physical Exam Vitals and nursing note reviewed.  Constitutional:      General: He is not in acute distress.    Appearance: Normal appearance. He is normal weight.     Comments: He is in a wheelchair.  He does look mildly weak  HENT:     Head: Normocephalic and atraumatic.     Mouth/Throat:     Mouth: Mucous membranes are moist.     Pharynx: Oropharynx is clear. No oropharyngeal exudate or posterior oropharyngeal erythema.  Eyes:     General: No scleral icterus.    Extraocular Movements: Extraocular movements intact.     Conjunctiva/sclera: Conjunctivae normal.     Pupils: Pupils are equal, round, and reactive to light.  Cardiovascular:     Rate and  Rhythm: Normal rate and regular rhythm.     Heart sounds: Normal heart sounds. No murmur heard.    No friction rub. No gallop.  Pulmonary:     Effort: Pulmonary effort is normal.     Breath sounds: Normal breath sounds. No wheezing, rhonchi or rales.  Abdominal:     General: Bowel sounds are normal. There is no distension.     Palpations: Abdomen is soft. There is no mass.     Tenderness: There is no abdominal tenderness.  Musculoskeletal:        General: Normal range of motion.     Cervical back: Normal range of motion and neck supple. No tenderness.     Right lower leg: No edema.     Left lower leg: No edema.  Lymphadenopathy:     Cervical: No cervical adenopathy.     Upper Body:     Right upper body: No supraclavicular or axillary adenopathy.     Left upper body: No supraclavicular or axillary adenopathy.     Lower Body: No right inguinal adenopathy. No left inguinal adenopathy.  Skin:    General: Skin is warm and dry.     Coloration: Skin is not jaundiced.     Findings: No rash.  Neurological:     Mental Status: He is alert and oriented to person, place, and  time.     Cranial Nerves: No cranial nerve deficit.  Psychiatric:        Mood and Affect: Mood normal.        Behavior: Behavior normal.        Thought Content: Thought content normal.      LABS:      Latest Ref Rng & Units 07/01/2023   10:37 AM 06/18/2023    1:50 PM 05/08/2023   10:44 AM  CBC  WBC 4.0 - 10.5 K/uL 16.2  17.1  15.4   Hemoglobin 13.0 - 17.0 g/dL 9.3  16.1  09.6   Hematocrit 39.0 - 52.0 % 28.9  32.2  32.2   Platelets 150 - 400 K/uL 428  484  328     Latest Reference Range & Units 06/18/23 13:51 06/19/23 13:55  Iron 45 - 182 ug/dL 31 (L)   UIBC ug/dL 045   TIBC 409 - 811 ug/dL 914   Saturation Ratios 17.9 - 39.5 % 11 (L)   Ferritin 24 - 336 ng/mL 201   Folate >5.9 ng/mL 10.6   Transferrin Receptor 12.2 - 27.3 nmol/L  23.9  (L): Data is abnormally low  Latest Reference Range & Units 06/18/23  13:50  MCV 80.0 - 100.0 fL 86.6   JAK2 V617F Result Comment   Comment: (NOTE) POSITIVE The JAK2 V617F mutation is detected in the provided specimen of this individual. Results should be interpreted in conjunction with clinical and other laboratory findings for the most accurate interpretation.      Latest Ref Rng & Units 07/01/2023   10:37 AM 06/18/2023    1:50 PM 05/08/2023   10:44 AM  CMP  Glucose 70 - 99 mg/dL 782  956  80   BUN 8 - 23 mg/dL 31  40  18   Creatinine 0.61 - 1.24 mg/dL 2.13  0.86  5.78   Sodium 135 - 145 mmol/L 141  141  141   Potassium 3.5 - 5.1 mmol/L 3.0  3.6  4.3   Chloride 98 - 111 mmol/L 103  103  102   CO2 22 - 32 mmol/L 22  22  20    Calcium 8.9 - 10.3 mg/dL 9.0  9.5  9.1   Total Protein 6.5 - 8.1 g/dL 5.8  6.4  6.0   Total Bilirubin 0.0 - 1.2 mg/dL 0.9  0.6  1.2   Alkaline Phos 38 - 126 U/L 120  127  135   AST 15 - 41 U/L 13  16  14    ALT 0 - 44 U/L <5  <5  10     ASSESSMENT & PLAN:  Assessment/Plan:  Jack Hodges is a 89 y.o. male with leukocytosis, thrombocythemia, and anemia.  In clinic today, I went over all of his recent labs with him.  The fact that he is JAK2 mutation positive suggests that there may be a component of a myeloproliferative disorder that could be factoring into both his leukocytosis and thrombocytosis.  In clinic today, I explained to the patient that I would consider hydroxyurea in the future if his white count rises above 20 or if his platelet count goes above 600.  For now, these elevated counts will be followed conservatively.  With respect to his anemia, his iron parameters were borderline low.  Furthermore, I was able to ascertain this gentleman's labs dating back nearly 10 years ago.  During that time, this gentleman's MCV was in the mid 90s.  Since then, it has fallen  by nearly 10 points.  Based upon this, I will give this gentleman IV iron to see if this will lead to an improvement in his hemoglobin and MCV over the next few  weeks/months.  As mentioned previously, he is scheduled for GI workup this week.  This gentleman does have baseline renal insufficiency, which very well could be contributing to his anemia.  As he is JAK2 mutation positive, the question is also whether this gentleman may have underlying myelofibrosis.  Based upon his age and comorbidities, I really wish to avoid a bone marrow biopsy unless it is absolutely necessary to prove such a diagnosis.  Otherwise, I will see this patient back in 3 months to reassess his labs to see if his IV iron help with any of his abnormal peripheral counts.  The patient and his family understand all the plans discussed today and are in agreement with them.    Shya Kovatch Kirby Funk, MD

## 2023-07-01 ENCOUNTER — Other Ambulatory Visit: Payer: Self-pay | Admitting: Oncology

## 2023-07-01 ENCOUNTER — Inpatient Hospital Stay: Attending: Hematology and Oncology

## 2023-07-01 ENCOUNTER — Inpatient Hospital Stay: Admitting: Oncology

## 2023-07-01 VITALS — BP 120/69 | HR 72 | Temp 97.7°F | Resp 16 | Ht 64.0 in

## 2023-07-01 DIAGNOSIS — D649 Anemia, unspecified: Secondary | ICD-10-CM

## 2023-07-01 DIAGNOSIS — D75839 Thrombocytosis, unspecified: Secondary | ICD-10-CM | POA: Diagnosis not present

## 2023-07-01 DIAGNOSIS — K921 Melena: Secondary | ICD-10-CM | POA: Insufficient documentation

## 2023-07-01 DIAGNOSIS — Z800 Family history of malignant neoplasm of digestive organs: Secondary | ICD-10-CM | POA: Diagnosis not present

## 2023-07-01 DIAGNOSIS — R7989 Other specified abnormal findings of blood chemistry: Secondary | ICD-10-CM

## 2023-07-01 DIAGNOSIS — Z87891 Personal history of nicotine dependence: Secondary | ICD-10-CM | POA: Diagnosis not present

## 2023-07-01 LAB — CMP (CANCER CENTER ONLY)
ALT: <5 U/L (ref 0–44)
AST: 13 U/L — ABNORMAL LOW (ref 15–41)
Albumin: 3.6 g/dL (ref 3.5–5.0)
Alkaline Phosphatase: 120 U/L (ref 38–126)
Anion gap: 17 — ABNORMAL HIGH (ref 5–15)
BUN: 31 mg/dL — ABNORMAL HIGH (ref 8–23)
CO2: 22 mmol/L (ref 22–32)
Calcium: 9 mg/dL (ref 8.9–10.3)
Chloride: 103 mmol/L (ref 98–111)
Creatinine: 1.54 mg/dL — ABNORMAL HIGH (ref 0.61–1.24)
GFR, Estimated: 43 mL/min — ABNORMAL LOW (ref >60)
Glucose, Bld: 173 mg/dL — ABNORMAL HIGH (ref 70–99)
Potassium: 3 mmol/L — ABNORMAL LOW (ref 3.5–5.1)
Sodium: 141 mmol/L (ref 135–145)
Total Bilirubin: 0.9 mg/dL (ref 0.0–1.2)
Total Protein: 5.8 g/dL — ABNORMAL LOW (ref 6.5–8.1)

## 2023-07-01 LAB — CBC WITH DIFFERENTIAL (CANCER CENTER ONLY)
Abs Immature Granulocytes: 0.39 10*3/uL — ABNORMAL HIGH (ref 0.00–0.07)
Basophils Absolute: 0.2 10*3/uL — ABNORMAL HIGH (ref 0.0–0.1)
Basophils Relative: 1 %
Eosinophils Absolute: 0.6 10*3/uL — ABNORMAL HIGH (ref 0.0–0.5)
Eosinophils Relative: 4 %
HCT: 28.9 % — ABNORMAL LOW (ref 39.0–52.0)
Hemoglobin: 9.3 g/dL — ABNORMAL LOW (ref 13.0–17.0)
Immature Granulocytes: 2 %
Lymphocytes Relative: 5 %
Lymphs Abs: 0.8 10*3/uL (ref 0.7–4.0)
MCH: 27.8 pg (ref 26.0–34.0)
MCHC: 32.2 g/dL (ref 30.0–36.0)
MCV: 86.3 fL (ref 80.0–100.0)
Monocytes Absolute: 0.5 10*3/uL (ref 0.1–1.0)
Monocytes Relative: 3 %
Neutro Abs: 13.7 10*3/uL — ABNORMAL HIGH (ref 1.7–7.7)
Neutrophils Relative %: 85 %
Platelet Count: 428 10*3/uL — ABNORMAL HIGH (ref 150–400)
RBC: 3.35 MIL/uL — ABNORMAL LOW (ref 4.22–5.81)
RDW: 19.1 % — ABNORMAL HIGH (ref 11.5–15.5)
WBC Count: 16.2 10*3/uL — ABNORMAL HIGH (ref 4.0–10.5)
nRBC: 0 /100{WBCs}
nRBC: 0.02 % (ref 0.0–0.2)

## 2023-07-01 NOTE — Addendum Note (Signed)
 Addended by: Rennis Harding A on: 07/01/2023 01:16 PM   Modules accepted: Orders

## 2023-07-02 ENCOUNTER — Telehealth: Payer: Self-pay | Admitting: Oncology

## 2023-07-02 NOTE — Telephone Encounter (Signed)
 Patient has been scheduled for follow-up visit per 07/02/23 LOS.  Pt noted appt details on personal planner/calendar.

## 2023-07-03 ENCOUNTER — Ambulatory Visit: Admitting: Hematology and Oncology

## 2023-07-03 ENCOUNTER — Other Ambulatory Visit

## 2023-07-03 NOTE — Anesthesia Preprocedure Evaluation (Signed)
 Anesthesia Evaluation  Patient identified by MRN, date of birth, ID band Patient awake    Reviewed: Allergy & Precautions, NPO status , Patient's Chart, lab work & pertinent test results  Airway Mallampati: III  TM Distance: >3 FB Neck ROM: Full    Dental  (+) Teeth Intact, Dental Advisory Given   Pulmonary shortness of breath, sleep apnea , former smoker   breath sounds clear to auscultation       Cardiovascular hypertension, + angina  + CAD, + Past MI and +CHF   Rhythm:Regular Rate:Normal     Neuro/Psych  PSYCHIATRIC DISORDERS  Depression   Dementia TIA Neuromuscular disease CVA    GI/Hepatic ,GERD  ,,  Endo/Other    Renal/GU      Musculoskeletal  (+) Arthritis ,    Abdominal   Peds  Hematology  (+) Blood dyscrasia, anemia   Anesthesia Other Findings   Reproductive/Obstetrics                             Anesthesia Physical Anesthesia Plan  ASA: 4  Anesthesia Plan: MAC   Post-op Pain Management: Minimal or no pain anticipated   Induction: Intravenous  PONV Risk Score and Plan: Propofol infusion  Airway Management Planned: Natural Airway and Simple Face Mask  Additional Equipment: None  Intra-op Plan:   Post-operative Plan:   Informed Consent: I have reviewed the patients History and Physical, chart, labs and discussed the procedure including the risks, benefits and alternatives for the proposed anesthesia with the patient or authorized representative who has indicated his/her understanding and acceptance.     Dental advisory given  Plan Discussed with: CRNA and Anesthesiologist  Anesthesia Plan Comments:         Anesthesia Quick Evaluation

## 2023-07-04 ENCOUNTER — Other Ambulatory Visit: Payer: Self-pay

## 2023-07-04 ENCOUNTER — Telehealth: Payer: Self-pay

## 2023-07-04 ENCOUNTER — Ambulatory Visit (HOSPITAL_BASED_OUTPATIENT_CLINIC_OR_DEPARTMENT_OTHER): Payer: Self-pay | Admitting: Anesthesiology

## 2023-07-04 ENCOUNTER — Ambulatory Visit (HOSPITAL_COMMUNITY): Payer: Self-pay | Admitting: Anesthesiology

## 2023-07-04 ENCOUNTER — Encounter (HOSPITAL_COMMUNITY)
Admission: RE | Disposition: A | Discharge status: Home / Self Care | Payer: Self-pay | Source: Home / Self Care | Attending: Gastroenterology

## 2023-07-04 ENCOUNTER — Encounter (HOSPITAL_COMMUNITY): Payer: Self-pay | Admitting: Gastroenterology

## 2023-07-04 ENCOUNTER — Ambulatory Visit (HOSPITAL_COMMUNITY)
Admission: RE | Admit: 2023-07-04 | Discharge: 2023-07-04 | Disposition: A | Discharge status: Home / Self Care | Attending: Gastroenterology | Admitting: Gastroenterology

## 2023-07-04 DIAGNOSIS — G4730 Sleep apnea, unspecified: Secondary | ICD-10-CM | POA: Insufficient documentation

## 2023-07-04 DIAGNOSIS — I110 Hypertensive heart disease with heart failure: Secondary | ICD-10-CM | POA: Diagnosis not present

## 2023-07-04 DIAGNOSIS — I252 Old myocardial infarction: Secondary | ICD-10-CM | POA: Insufficient documentation

## 2023-07-04 DIAGNOSIS — K648 Other hemorrhoids: Secondary | ICD-10-CM | POA: Insufficient documentation

## 2023-07-04 DIAGNOSIS — K625 Hemorrhage of anus and rectum: Secondary | ICD-10-CM

## 2023-07-04 DIAGNOSIS — F0393 Unspecified dementia, unspecified severity, with mood disturbance: Secondary | ICD-10-CM | POA: Diagnosis not present

## 2023-07-04 DIAGNOSIS — I5032 Chronic diastolic (congestive) heart failure: Secondary | ICD-10-CM | POA: Diagnosis not present

## 2023-07-04 DIAGNOSIS — K5730 Diverticulosis of large intestine without perforation or abscess without bleeding: Secondary | ICD-10-CM | POA: Diagnosis not present

## 2023-07-04 DIAGNOSIS — Z6824 Body mass index (BMI) 24.0-24.9, adult: Secondary | ICD-10-CM | POA: Insufficient documentation

## 2023-07-04 DIAGNOSIS — G709 Myoneural disorder, unspecified: Secondary | ICD-10-CM | POA: Diagnosis not present

## 2023-07-04 DIAGNOSIS — R634 Abnormal weight loss: Secondary | ICD-10-CM | POA: Insufficient documentation

## 2023-07-04 DIAGNOSIS — Z79899 Other long term (current) drug therapy: Secondary | ICD-10-CM | POA: Diagnosis not present

## 2023-07-04 DIAGNOSIS — Z7982 Long term (current) use of aspirin: Secondary | ICD-10-CM | POA: Insufficient documentation

## 2023-07-04 DIAGNOSIS — K257 Chronic gastric ulcer without hemorrhage or perforation: Secondary | ICD-10-CM | POA: Diagnosis not present

## 2023-07-04 DIAGNOSIS — M19041 Primary osteoarthritis, right hand: Secondary | ICD-10-CM | POA: Insufficient documentation

## 2023-07-04 DIAGNOSIS — I25119 Atherosclerotic heart disease of native coronary artery with unspecified angina pectoris: Secondary | ICD-10-CM | POA: Diagnosis not present

## 2023-07-04 DIAGNOSIS — Z87891 Personal history of nicotine dependence: Secondary | ICD-10-CM | POA: Diagnosis not present

## 2023-07-04 DIAGNOSIS — K2970 Gastritis, unspecified, without bleeding: Secondary | ICD-10-CM | POA: Insufficient documentation

## 2023-07-04 DIAGNOSIS — K219 Gastro-esophageal reflux disease without esophagitis: Secondary | ICD-10-CM | POA: Insufficient documentation

## 2023-07-04 DIAGNOSIS — D509 Iron deficiency anemia, unspecified: Secondary | ICD-10-CM | POA: Diagnosis not present

## 2023-07-04 DIAGNOSIS — Z8673 Personal history of transient ischemic attack (TIA), and cerebral infarction without residual deficits: Secondary | ICD-10-CM | POA: Diagnosis not present

## 2023-07-04 DIAGNOSIS — K5790 Diverticulosis of intestine, part unspecified, without perforation or abscess without bleeding: Secondary | ICD-10-CM | POA: Diagnosis not present

## 2023-07-04 DIAGNOSIS — I5033 Acute on chronic diastolic (congestive) heart failure: Secondary | ICD-10-CM | POA: Diagnosis not present

## 2023-07-04 DIAGNOSIS — K259 Gastric ulcer, unspecified as acute or chronic, without hemorrhage or perforation: Secondary | ICD-10-CM

## 2023-07-04 DIAGNOSIS — Z8261 Family history of arthritis: Secondary | ICD-10-CM | POA: Insufficient documentation

## 2023-07-04 DIAGNOSIS — R195 Other fecal abnormalities: Secondary | ICD-10-CM | POA: Diagnosis not present

## 2023-07-04 DIAGNOSIS — Z800 Family history of malignant neoplasm of digestive organs: Secondary | ICD-10-CM | POA: Insufficient documentation

## 2023-07-04 DIAGNOSIS — F32A Depression, unspecified: Secondary | ICD-10-CM | POA: Insufficient documentation

## 2023-07-04 DIAGNOSIS — Z823 Family history of stroke: Secondary | ICD-10-CM | POA: Insufficient documentation

## 2023-07-04 DIAGNOSIS — K2971 Gastritis, unspecified, with bleeding: Secondary | ICD-10-CM | POA: Diagnosis not present

## 2023-07-04 DIAGNOSIS — K649 Unspecified hemorrhoids: Secondary | ICD-10-CM | POA: Diagnosis not present

## 2023-07-04 DIAGNOSIS — Z8249 Family history of ischemic heart disease and other diseases of the circulatory system: Secondary | ICD-10-CM | POA: Insufficient documentation

## 2023-07-04 DIAGNOSIS — K3189 Other diseases of stomach and duodenum: Secondary | ICD-10-CM | POA: Diagnosis not present

## 2023-07-04 DIAGNOSIS — Z8719 Personal history of other diseases of the digestive system: Secondary | ICD-10-CM | POA: Diagnosis not present

## 2023-07-04 DIAGNOSIS — D500 Iron deficiency anemia secondary to blood loss (chronic): Secondary | ICD-10-CM

## 2023-07-04 HISTORY — PX: COLONOSCOPY: SHX5424

## 2023-07-04 HISTORY — DX: Gastric ulcer, unspecified as acute or chronic, without hemorrhage or perforation: K25.9

## 2023-07-04 HISTORY — PX: ESOPHAGOGASTRODUODENOSCOPY: SHX5428

## 2023-07-04 SURGERY — COLONOSCOPY
Anesthesia: Monitor Anesthesia Care

## 2023-07-04 MED ORDER — OXYCODONE HCL 5 MG/5ML PO SOLN: 5.0000 mg | Freq: Once | ORAL | Status: DC | PRN

## 2023-07-04 MED ORDER — SODIUM CHLORIDE 0.9 % IV SOLN: INTRAVENOUS | Status: DC | PRN

## 2023-07-04 MED ORDER — OXYCODONE HCL 5 MG PO TABS: 5.0000 mg | ORAL_TABLET | Freq: Once | ORAL | Status: DC | PRN

## 2023-07-04 MED ORDER — EPHEDRINE SULFATE-NACL 50-0.9 MG/10ML-% IV SOSY
PREFILLED_SYRINGE | INTRAVENOUS | Status: DC | PRN
  Administered 2023-07-04: 5 mg via INTRAVENOUS

## 2023-07-04 MED ORDER — PROPOFOL 500 MG/50ML IV EMUL
INTRAVENOUS | Status: DC | PRN
  Administered 2023-07-04: 20 mg via INTRAVENOUS
  Administered 2023-07-04: 40 mg via INTRAVENOUS
  Administered 2023-07-04: 150 ug/kg/min via INTRAVENOUS
  Administered 2023-07-04: 50 mg via INTRAVENOUS

## 2023-07-04 MED ORDER — PHENYLEPHRINE 80 MCG/ML (10ML) SYRINGE FOR IV PUSH (FOR BLOOD PRESSURE SUPPORT)
PREFILLED_SYRINGE | INTRAVENOUS | Status: DC | PRN
  Administered 2023-07-04: 80 ug via INTRAVENOUS
  Administered 2023-07-04 (×2): 160 ug via INTRAVENOUS
  Administered 2023-07-04: 80 ug via INTRAVENOUS

## 2023-07-04 MED ORDER — ONDANSETRON HCL 4 MG/2ML IJ SOLN: 4.0000 mg | Freq: Once | INTRAMUSCULAR | Status: DC | PRN

## 2023-07-04 MED ORDER — MEPERIDINE HCL 25 MG/ML IJ SOLN: 6.2500 mg | INTRAMUSCULAR | Status: DC | PRN

## 2023-07-04 MED ORDER — LIDOCAINE 2% (20 MG/ML) 5 ML SYRINGE
INTRAMUSCULAR | Status: DC | PRN
  Administered 2023-07-04: 20 mg via INTRAVENOUS
  Administered 2023-07-04: 60 mg via INTRAVENOUS

## 2023-07-04 MED ORDER — FENTANYL CITRATE (PF) 100 MCG/2ML IJ SOLN: 25.0000 ug | INTRAMUSCULAR | Status: DC | PRN

## 2023-07-04 NOTE — Op Note (Signed)
 [facility] Patient Name: Jack Hodges Procedure Date : 07/04/2023 MRN: [id number] Attending MD: Dub Amis. Tomasa Rand , MD, [id number] Date of Birth: Feb 13, 1931 CSN: [id number] Age: 89 Admit Type: Outpatient Procedure:                Colonoscopy Indications:              Heme positive stool, Iron deficiency anemia, Weight                            loss Providers:                Lorin Picket E. Tomasa Rand, MD, Martha Clan, RN,                            Salley Scarlet, Technician Referring MD:              Medicines:                Monitored Anesthesia Care Complications:            No immediate complications. Estimated Blood Loss:     Estimated blood loss: none. Procedure:                Pre-Anesthesia Assessment:                           - Prior to the procedure, a History and Physical                            was performed, and patient medications and                            allergies were reviewed. The patient's tolerance of                            previous anesthesia was also reviewed. The risks                            and benefits of the procedure and the sedation                            options and risks were discussed with the patient.                            All questions were answered, and informed consent                            was obtained. Prior Anticoagulants: The patient has                            taken no anticoagulant or antiplatelet agents. ASA                            Grade Assessment: IV - A patient with severe  systemic disease that is a constant threat to life.                            After reviewing the risks and benefits, the patient                            was deemed in satisfactory condition to undergo the                            procedure.                           - Prior to the procedure, a History and Physical                            was performed, and patient medications and                             allergies were reviewed. The patient's tolerance of                            previous anesthesia was also reviewed. The risks                            and benefits of the procedure and the sedation                            options and risks were discussed with the patient.                            All questions were answered, and informed consent                            was obtained. Prior Anticoagulants: The patient has                            taken no anticoagulant or antiplatelet agents. ASA                            Grade Assessment: IV - A patient with severe                            systemic disease that is a constant threat to life.                            After reviewing the risks and benefits, the patient                            was deemed in satisfactory condition to undergo the                            procedure.  After obtaining informed consent, the colonoscope                            was passed under direct vision. Throughout the                            procedure, the patient's blood pressure, pulse, and                            oxygen saturations were monitored continuously. The                            [id number]-HQ190L ([id number] Olympus coloscope was                            introduced through the anus and advanced to the the                            terminal ileum, with identification of the                            appendiceal orifice and IC valve. The colonoscopy                            was performed without difficulty. The patient                            tolerated the procedure well. The quality of the                            bowel preparation was excellent. The terminal                            ileum, ileocecal valve, appendiceal orifice, and                            rectum were photographed. Scope In: 7:50:56 AM Scope Out: 8:04:10 AM Scope Withdrawal Time: 0 hours 9  minutes 24 seconds  Total Procedure Duration: 0 hours 13 minutes 14 seconds  Findings:      The perianal and digital rectal examinations were normal. Pertinent       negatives include normal sphincter tone and no palpable rectal lesions.      Multiple medium-mouthed and small-mouthed diverticula were found in the       sigmoid [place] and descending Chin.      The exam was otherwise normal throughout the examined Piers.      The terminal ileum appeared normal.      Non-bleeding internal hemorrhoids were found during retroflexion. The       hemorrhoids were medium-sized.      No additional abnormalities were found on retroflexion. Impression:               - Diverticulosis in the sigmoid Thadeus and in the  descending [place].                           - The examined portion of the ileum was normal.                           - Non-bleeding internal hemorrhoids.                           - No specimens collected. Moderate Sedation:      N/A Recommendation:           - Patient has a contact number available for                            emergencies. The signs and symptoms of potential                            delayed complications were discussed with the                            patient. Return to normal activities tomorrow.                            Written discharge instructions were provided to the                            patient.                           - Resume previous diet.                           - Continue present medications.                           - Please impression/recs from EGD Procedure Code(s):        --- Professional ---                           [id number], Colonoscopy, flexible; diagnostic, including                            collection of specimen(s) by brushing or washing,                            when performed (separate procedure) Diagnosis Code(s):        --- Professional ---                           K64.8, Other hemorrhoids                            R19.5, Other fecal abnormalities                           D50.9, Iron deficiency anemia, unspecified  R63.4, Abnormal weight loss                           K57.30, Diverticulosis of large intestine without                            perforation or abscess without bleeding CPT copyright 2022 American Medical Association. All rights reserved. The codes documented in this report are preliminary and upon coder review may  be revised to meet current compliance requirements. Brody Bonneau E. Tomasa Rand, MD 07/04/2023 8:29:28 AM This report has been signed electronically. Number of Addenda: 0

## 2023-07-04 NOTE — Anesthesia Postprocedure Evaluation (Signed)
 Anesthesia Post Note  Patient: Jack Hodges  Procedure(s) Performed: COLONOSCOPY EGD (ESOPHAGOGASTRODUODENOSCOPY)     Patient location during evaluation: PACU Anesthesia Type: MAC Level of consciousness: awake and alert Pain management: pain level controlled Vital Signs Assessment: post-procedure vital signs reviewed and stable Respiratory status: spontaneous breathing, nonlabored ventilation, respiratory function stable and patient connected to nasal cannula oxygen Cardiovascular status: stable and blood pressure returned to baseline Postop Assessment: no apparent nausea or vomiting Anesthetic complications: no   No notable events documented.  Last Vitals:  Vitals:   07/04/23 0845 07/04/23 0900  BP: (!) 130/59 128/63  Pulse: 68 68  Resp: 16 14  Temp:  36.7 C  SpO2: 97% 99%    Last Pain:  Vitals:   07/04/23 0812  TempSrc:   PainSc: Asleep                 Fermon Ureta

## 2023-07-04 NOTE — Discharge Instructions (Signed)
 Procedure note copy given.

## 2023-07-04 NOTE — Telephone Encounter (Signed)
 Copied from CRM [id number]. Topic: Clinical - Medical Advice >> Jul 04, 2023  3:08 PM DeAngela L wrote: Reason for CRM: Patients son [name] calling about the right hand having a carpal tunnel concerns and would like to ask if the patient could get an injection in the right hand to help with the pain [name] call back number  [phone]

## 2023-07-04 NOTE — Transfer of Care (Cosign Needed)
 Immediate Anesthesia Transfer of Care Note  Patient: Odes W Southers  Procedure(s) Performed: COLONOSCOPY EGD (ESOPHAGOGASTRODUODENOSCOPY)  Patient Location: PACU  Anesthesia Type:MAC  Level of Consciousness: awake, alert , oriented, and patient cooperative  Airway & Oxygen Therapy: Patient Spontanous Breathing and Patient connected to nasal cannula oxygen  Post-op Assessment: Report given to RN and Post -op Vital signs reviewed and stable  Post vital signs: Reviewed and stable  Last Vitals:  Vitals Value Taken Time  BP    Temp    Pulse    Resp    SpO2      Last Pain:  Vitals:   07/04/23 0700  TempSrc: Temporal  PainSc: 7          Complications: No notable events documented.

## 2023-07-04 NOTE — Interval H&P Note (Signed)
 History and Physical Interval Note:  07/04/2023 7:18 AM  Jack Hodges  has presented today for surgery, with the diagnosis of GERD, POSSIBLE FECAL OCCULT, RECTAL  BLEEDING, WEIGHT LOSS.  The various methods of treatment have been discussed with the patient and family. After consideration of risks, benefits and other options for treatment, the patient has consented to  Procedure(s): COLONOSCOPY (N/A) EGD (ESOPHAGOGASTRODUODENOSCOPY) (N/A) as a surgical intervention.  The patient's history has been reviewed, patient examined, no change in status, stable for surgery.  I have reviewed the patient's chart and labs.  Questions were answered to the patient's satisfaction.     Jenel Lucks

## 2023-07-04 NOTE — Op Note (Signed)
 [facility] Patient Name: Jack Hodges Procedure Date : 07/04/2023 MRN: [id number] Attending MD: Dub Amis. Tomasa Rand , MD, [id number] Date of Birth: 1930/11/20 CSN: [id number] Age: 89 Admit Type: Outpatient Procedure:                Upper GI endoscopy Indications:              Iron deficiency anemia, Heme positive stool, Weight                            loss Providers:                Lorin Picket E. Tomasa Rand, MD, Martha Clan, RN,                            Salley Scarlet, Technician Referring MD:              Medicines:                Monitored Anesthesia Care Complications:            No immediate complications. Estimated Blood Loss:     Estimated blood loss was minimal. Procedure:                Pre-Anesthesia Assessment:                           - Prior to the procedure, a History and Physical                            was performed, and patient medications and                            allergies were reviewed. The patient's tolerance of                            previous anesthesia was also reviewed. The risks                            and benefits of the procedure and the sedation                            options and risks were discussed with the patient.                            All questions were answered, and informed consent                            was obtained. Prior Anticoagulants: The patient has                            taken no anticoagulant or antiplatelet agents. ASA                            Grade Assessment: IV - A patient with severe  systemic disease that is a constant threat to life.                            After reviewing the risks and benefits, the patient                            was deemed in satisfactory condition to undergo the                            procedure.                           After obtaining informed consent, the endoscope was                            passed under direct vision.  Throughout the                            procedure, the patient's blood pressure, pulse, and                            oxygen saturations were monitored continuously. The                            [id number]-[id number] ([id number]) Olympus endoscope was introduced                            through the mouth, and advanced to the third part                            of duodenum. The upper GI endoscopy was                            accomplished without difficulty. The patient                            tolerated the procedure well. Scope In: Scope Out: Findings:      The examined portions of the nasopharynx, oropharynx and larynx were       normal.      The examined esophagus was normal.      Two non-bleeding superficial gastric ulcers with no stigmata of bleeding       were found on the greater curvature of the stomach. The largest lesion       was 4 mm in largest dimension. Biopsies were taken with a cold forceps       for Helicobacter pylori testing. Estimated blood loss was minimal.      Scattered mild inflammation characterized by congestion (edema),       erythema and nodularity was found in the gastric body and in the gastric       antrum. Biopsies were taken with a cold forceps for Helicobacter pylori       testing. Estimated blood loss was minimal.      The exam of the stomach was otherwise normal.      The examined duodenum was normal. Impression:               -  The examined portions of the nasopharynx,                            oropharynx and larynx were normal.                           - Normal esophagus.                           - Non-bleeding gastric ulcers with no stigmata of                            bleeding. Biopsied. This is a likely source of iron                            deficiency anemia and positive FOBT.                           - Gastritis. Biopsied.                           - Normal examined duodenum. Moderate Sedation:      N/A Recommendation:           -  Patient has a contact number available for                            emergencies. The signs and symptoms of potential                            delayed complications were discussed with the                            patient. Return to normal activities tomorrow.                            Written discharge instructions were provided to the                            patient.                           - Resume previous diet.                           - Continue present medications.                           - Await pathology results.                           - Use Protonix (pantoprazole) 40 mg PO BID for 8                            weeks, then resume once daily                           -  Avoid NSAIDs                           - Although repeat EGD is usually recommended to                            document resolution of ulcer, based on the                            patient's age, comorbidities and the very small,                            benign appearance of the gastric ulcers, I would                            recommend against routine repeat EGD.                           - Proceed with IV iron as planned by hematology.                           - Repeat CBC in 2 months. Consider capsule                            endoscopy if patient's hemoglobin does not improve Procedure Code(s):        --- Professional ---                           [phone], Esophagogastroduodenoscopy, flexible,                            transoral; with biopsy, single or multiple Diagnosis Code(s):        --- Professional ---                           K25.9, Gastric ulcer, unspecified as acute or                            chronic, without hemorrhage or perforation                           K29.70, Gastritis, unspecified, without bleeding                           D50.9, Iron deficiency anemia, unspecified                           R19.5, Other fecal abnormalities                           R63.4, Abnormal  weight loss CPT copyright 2022 American Medical Association. All rights reserved. The codes documented in this report are preliminary and upon coder review may  be revised to meet current compliance requirements. Anique Beckley E. Tomasa Rand, MD 07/04/2023 8:21:53 AM This report has been signed electronically. Number of Addenda: 0

## 2023-07-05 ENCOUNTER — Other Ambulatory Visit: Payer: Self-pay | Admitting: Physician Assistant

## 2023-07-05 ENCOUNTER — Other Ambulatory Visit

## 2023-07-05 ENCOUNTER — Ambulatory Visit: Admitting: Hematology and Oncology

## 2023-07-05 LAB — SURGICAL PATHOLOGY

## 2023-07-05 NOTE — Telephone Encounter (Signed)
 Copied from CRM [id number]. Topic: Clinical - Medication Refill >> Jul 05, 2023 10:00 AM Franchot Heidelberg wrote: Most Recent Primary Care Visit:  Provider: Langley Gauss  Department: [facility]  Visit Type: ACUTE  Date: 06/12/2023  Medication: carvedilol (COREG) 25 MG tablet   potassium chloride (KLOR-CON) 10 MEQ tablet   carbidopa-levodopa (SINEMET) 25-100 MG tablet   Has the patient contacted their pharmacy? Yes (Agent: If no, request that the patient contact the pharmacy for the refill. If patient does not wish to contact the pharmacy document the reason why and proceed with request.) (Agent: If yes, when and what did the pharmacy advise?)  Is this the correct pharmacy for this prescription? Yes If no, delete pharmacy and type the correct one.  This is the patient's preferred pharmacy:  [facility] - [place], [place] - [address] [place] [address] Phone: [phone] Fax: [phone]  Has the prescription been filled recently? Yes  Is the patient out of the medication? Yes  Has the patient been seen for an appointment in the last year OR does the patient have an upcoming appointment? Yes  Can we respond through MyChart? Yes  Agent: Please be advised that Rx refills may take up to 3 business days. We ask that you follow-up with your pharmacy.

## 2023-07-05 NOTE — Telephone Encounter (Signed)
 Refills existing for potassium, coreg and carbidopa-levodopa.

## 2023-07-07 ENCOUNTER — Encounter: Payer: Self-pay | Admitting: Physician Assistant

## 2023-07-08 ENCOUNTER — Telehealth: Payer: Self-pay

## 2023-07-08 ENCOUNTER — Inpatient Hospital Stay

## 2023-07-08 ENCOUNTER — Other Ambulatory Visit: Payer: Self-pay | Admitting: Physician Assistant

## 2023-07-08 ENCOUNTER — Encounter (HOSPITAL_COMMUNITY): Payer: Self-pay | Admitting: Gastroenterology

## 2023-07-08 ENCOUNTER — Other Ambulatory Visit: Payer: Self-pay | Admitting: *Deleted

## 2023-07-08 VITALS — BP 109/59 | HR 81 | Temp 98.0°F | Resp 18

## 2023-07-08 DIAGNOSIS — D649 Anemia, unspecified: Secondary | ICD-10-CM

## 2023-07-08 DIAGNOSIS — F03B0 Unspecified dementia, moderate, without behavioral disturbance, psychotic disturbance, mood disturbance, and anxiety: Secondary | ICD-10-CM

## 2023-07-08 DIAGNOSIS — I639 Cerebral infarction, unspecified: Secondary | ICD-10-CM

## 2023-07-08 DIAGNOSIS — M21371 Foot drop, right foot: Secondary | ICD-10-CM

## 2023-07-08 DIAGNOSIS — R7989 Other specified abnormal findings of blood chemistry: Secondary | ICD-10-CM

## 2023-07-08 MED ORDER — SODIUM CHLORIDE 0.9 % IV SOLN
510.0000 mg | Freq: Once | INTRAVENOUS | Status: AC
  Administered 2023-07-08: 510 mg via INTRAVENOUS
  Filled 2023-07-08: qty 510

## 2023-07-08 NOTE — Progress Notes (Signed)
 Mr. Jack Hodges,  The biopsies taken from your stomach were notable for mild gastritis (inflammation) which is a common finding, but there was no evidence of Helicobacter pylori infection. As discussed, it is likely that the gastric ulcers were causing your blood counts and iron to be low.  Please take the acid reducing medication twice a day for 8 weeks as instructed and avoid NSAIDs which can cause ulcers.  Continue to monitor blood counts and consider further evaluation (capsule endoscopy) if your blood counts are not improving

## 2023-07-08 NOTE — Patient Instructions (Signed)

## 2023-07-08 NOTE — Telephone Encounter (Signed)
 An order is needed for home health service.

## 2023-07-08 NOTE — Telephone Encounter (Signed)
 Copied from CRM [id number]. Topic: Referral - Question >> Jul 04, 2023  3:05 PM DeAngela L wrote: Reason for CRM: Patients son calling about a home health nurse referral and this was previously discussed but have not heard anything back the son would like a follow up  Please call back Chaneta Comer [phone]

## 2023-07-09 ENCOUNTER — Other Ambulatory Visit: Payer: Self-pay | Admitting: Physician Assistant

## 2023-07-09 DIAGNOSIS — G459 Transient cerebral ischemic attack, unspecified: Secondary | ICD-10-CM

## 2023-07-09 DIAGNOSIS — R531 Weakness: Secondary | ICD-10-CM

## 2023-07-09 DIAGNOSIS — I639 Cerebral infarction, unspecified: Secondary | ICD-10-CM

## 2023-07-09 DIAGNOSIS — M21371 Foot drop, right foot: Secondary | ICD-10-CM

## 2023-07-09 DIAGNOSIS — I219 Acute myocardial infarction, unspecified: Secondary | ICD-10-CM

## 2023-07-09 DIAGNOSIS — F0150 Vascular dementia without behavioral disturbance: Secondary | ICD-10-CM

## 2023-07-09 DIAGNOSIS — R296 Repeated falls: Secondary | ICD-10-CM

## 2023-07-09 DIAGNOSIS — G5603 Carpal tunnel syndrome, bilateral upper limbs: Secondary | ICD-10-CM

## 2023-07-09 NOTE — Telephone Encounter (Signed)
Spoke with patient, verbalized understanding and had no questions at this time.  

## 2023-07-12 ENCOUNTER — Inpatient Hospital Stay

## 2023-07-12 VITALS — BP 100/71 | HR 80 | Temp 98.4°F | Resp 18

## 2023-07-12 DIAGNOSIS — D649 Anemia, unspecified: Secondary | ICD-10-CM | POA: Diagnosis not present

## 2023-07-12 DIAGNOSIS — R7989 Other specified abnormal findings of blood chemistry: Secondary | ICD-10-CM

## 2023-07-12 MED ORDER — SODIUM CHLORIDE 0.9 % IV SOLN: INTRAVENOUS | Status: DC | PRN

## 2023-07-12 MED ORDER — SODIUM CHLORIDE 0.9 % IV SOLN
510.0000 mg | Freq: Once | INTRAVENOUS | Status: AC
  Administered 2023-07-12: 510 mg via INTRAVENOUS
  Filled 2023-07-12: qty 510

## 2023-07-12 NOTE — Patient Instructions (Signed)

## 2023-07-15 ENCOUNTER — Inpatient Hospital Stay

## 2023-07-18 ENCOUNTER — Ambulatory Visit: Payer: Self-pay

## 2023-07-18 ENCOUNTER — Telehealth: Payer: Self-pay

## 2023-07-18 NOTE — Telephone Encounter (Signed)
 Copied from CRM [id number]. Topic: Referral - Question >> Jul 18, 2023 12:51 PM Rennis Case wrote: Reason for CRM: Jack Hodges requesting update on home health order. Jack Hodges has not heard back from anyone in regards to this.  Requesting a call back in regards to this, [phone].

## 2023-07-18 NOTE — Telephone Encounter (Signed)
  Chief Complaint: Generalized body pain Symptoms: Pain to hands, shoulders, back and neck Frequency: started three weeks ago Pertinent Negatives: Patient denies CP, SOB Disposition: [] ED /[] Urgent Care (no appt availability in office) / [x] Appointment(In office/virtual)/ []  [place] [facility]/ [] Home Care/ [] Refused Recommended Disposition /[] [place] [facility]/ []  Follow-up with PCP Additional Notes: patient's son called with concerns for patient's pain level. Son states patient has been having increases in pain to hand, shoulders, back and neck. Pain started about three weeks ago. Son states patient has been taking Tylenol  but nothing else helps. Son feels likes patient needs to be seen in office but with his son with him. Per protocol, patient is recommended to be seen within three days. Appointment made for tomorrow at 10 AM with PCP. Son states he will be bringing his father and coming into the room with him. Son is concerned that the patient doesn't give full information if he is by himself. Son verbalized understanding of the plan and all questions answered.    Copied from CRM [id number]. Topic: Clinical - Red Word Triage >> Jul 18, 2023 12:52 PM Carla L wrote: Red Word that prompted transfer to Nurse Triage: patient having pain, hands, shoulders and back Reason for Disposition  [1] MODERATE pain (e.g., interferes with normal activities) AND [2] present > 3 days  Answer Assessment - Initial Assessment Questions 1. ONSET: "When did the muscle aches or body pains start?"      Episode of pain started three weeks ago 2. LOCATION: "What part of your body is hurting?" (e.g., entire body, arms, legs)      Pain to hands, shoulders, neck and back 3. SEVERITY: "How bad is the pain?" (Scale 1-10; or mild, moderate, severe)   - MILD (1-3): doesn't interfere with normal activities    - MODERATE (4-7): interferes with normal activities or awakens from sleep    - SEVERE (8-10):  excruciating  pain, unable to do any normal activities      9-10 out of 10 4. CAUSE: "What do you think is causing the pains?"     unsure 5. FEVER: "Have you been having fever?"     no 6. OTHER SYMPTOMS: "Do you have any other symptoms?" (e.g., chest pain, weakness, rash, cold or flu symptoms, weight loss)     Weight loss, weakness 8. TRAVEL: "Have you traveled out of the country in the last month?" (e.g., travel history, exposures)     no  Protocols used: Muscle Aches and Body Pain-A-AH

## 2023-07-19 ENCOUNTER — Ambulatory Visit (INDEPENDENT_AMBULATORY_CARE_PROVIDER_SITE_OTHER): Admitting: Physician Assistant

## 2023-07-19 ENCOUNTER — Encounter: Payer: Self-pay | Admitting: Physician Assistant

## 2023-07-19 VITALS — BP 150/72 | HR 71 | Temp 97.8°F | Ht 64.0 in | Wt 147.0 lb

## 2023-07-19 DIAGNOSIS — D500 Iron deficiency anemia secondary to blood loss (chronic): Secondary | ICD-10-CM | POA: Diagnosis not present

## 2023-07-19 DIAGNOSIS — G5603 Carpal tunnel syndrome, bilateral upper limbs: Secondary | ICD-10-CM | POA: Diagnosis not present

## 2023-07-19 DIAGNOSIS — G252 Other specified forms of tremor: Secondary | ICD-10-CM | POA: Diagnosis not present

## 2023-07-19 DIAGNOSIS — R531 Weakness: Secondary | ICD-10-CM

## 2023-07-19 DIAGNOSIS — M542 Cervicalgia: Secondary | ICD-10-CM

## 2023-07-19 DIAGNOSIS — M7552 Bursitis of left shoulder: Secondary | ICD-10-CM

## 2023-07-19 DIAGNOSIS — I110 Hypertensive heart disease with heart failure: Secondary | ICD-10-CM

## 2023-07-19 DIAGNOSIS — I509 Heart failure, unspecified: Secondary | ICD-10-CM | POA: Diagnosis not present

## 2023-07-19 LAB — GLUCOSE, POCT (MANUAL RESULT ENTRY): POC Glucose: 88 mg/dL (ref 70–99)

## 2023-07-19 MED ORDER — TRIAMCINOLONE ACETONIDE 40 MG/ML IJ SUSP
80.0000 mg | Freq: Once | INTRAMUSCULAR | Status: AC
  Administered 2023-07-19: 80 mg via INTRAMUSCULAR

## 2023-07-19 MED ORDER — PREGABALIN 75 MG PO CAPS: 75.0000 mg | ORAL_CAPSULE | Freq: Two times a day (BID) | ORAL | 0 refills | Status: DC

## 2023-07-19 NOTE — Progress Notes (Signed)
 Acute Office Visit  Subjective:    Patient ID: Jack Hodges, male    DOB: 02/12/1931, 89 y.o.   MRN: [id number]  Chief Complaint  Patient presents with   Neck and bilateral hand pain    HPI: Patient is in today for worsening carpel tunnel, neck and shoulder pain.  Discussed the use of AI scribe software for clinical note transcription with the patient, who gave verbal consent to proceed.  History of Present Illness   The patient, with a history of low iron levels, presents with worsening hand pain, neck pain, and shoulder pain. The hand pain is described as a sharp, stabbing pain with associated numbness and tingling. The patient has been experiencing resting tremors in his hands for over a week. The patient reports that the pain is so severe that he is unable to squeeze the doctor's hand due to pain. The patient also reports neck and shoulder pain, which has been present for about a month. The neck pain is described as sharp and rates it as an 8 or 10 on the pain scale. The patient's caregiver reports that the patient's diet is poor, often only eating one meal a day and supplementing with Ensure drinks. The patient has been on gabapentin  for nerve pain, but it has not been effective. The patient's caregiver is trying to arrange home healthcare for the patient.       Past Medical History:  Diagnosis Date   Acute bursitis of left shoulder 01/04/2020   Acute on chronic diastolic congestive heart failure (HCC) 11/13/2021   Last Assessment & Plan:  Formatting of this note is different from the original. Pertinent Data:   Current medication includes: furosemide  - 20 mg lisinopriL  - 10 mg metoprolol  succinate - 25 mg.  BNP (pg/mL)  Date Value  11/15/2021 433.6 (H)   eGFR (mL/min/1.37m*2)  Date Value  11/15/2021 >60.0    BP Readings from Last 3 Encounters:  11/30/21 (!) 147/82  11/15/21 (!) 109/54  11/13/21 (!) 134/74     Acute respiratory failure with hypoxia (HCC) 03/17/2020   Angina  pectoris (HCC) 10/07/2019   Arthritis    Atherosclerosis of aorta (HCC) 09/24/2019   Bilateral foot-drop 01/18/2017   BMI 27.0-27.9,adult 09/24/2019   Coronary artery disease involving native coronary artery of native heart without angina pectoris 11/01/2015   Overview:  Bypass surgery in 1996 Overview:  Overview:  Bypass surgery in 1996  Last Assessment & Plan:  Continue his current regimen.  Follow up as noted.   CVA (cerebral vascular accident) (HCC) 04/03/2016   Last Assessment & Plan:  The patient underwent extensive CVA workup.  MRI is negative.  He does have aortic stenosis by echocardiogram.  Upon review of Care everywhere he is followed by Dr. Gordan Latina, cardiologist for this.  Carotid ultrasound negative for hemodynamically significant stenosis.  Lipid panel within normal limits.  He has not had any further episodes of dizziness or nausea vomiting.    Demand ischemia (HCC) 11/14/2021   Last Assessment & Plan:  Formatting of this note might be different from the original. Troponin flat No anginal symptoms Formatting of this note might be different from the original. Last Assessment & Plan:  Formatting of this note might be different from the original. Troponin flat No anginal symptoms   Dementia (HCC)    Depression    DNR (do not resuscitate) 04/03/2016   Overview:  I had an extensive discussion with the patient on 04/03/2016 regarding his end of life  wishes.  He and his daughter agree that do not resuscitate is in keeping with his beliefs.   Dyslipidemia 11/01/2015   Last Assessment & Plan:  Formatting of this note might be different from the original. Cholesterol 136   Triglycerides  83  HDL  50.1  LDL Calculated  69  VLDL Cholesterol Cal  17   Continue statin.   Dyspnea    with activity   Essential hypertension 11/01/2015   Last Assessment & Plan:  Resume home medications.  Follow-up as noted above.   Generalized weakness 03/13/2020   GERD (gastroesophageal reflux disease)     Herpes zoster 05/19/2019   History of aortic stenosis 03/11/2020   History of CVA (cerebrovascular accident) 11/14/2021   Last Assessment & Plan:  Formatting of this note might be different from the original. Neurologically intact upon assessment -continue aspirin , statin   Hypertensive heart disease with heart failure (HCC) 11/01/2015   Last Assessment & Plan:  Resume home medications.  Follow-up as noted above.   Myocardial infarction (HCC)    approx 1994   Neuropathy    Peripheral neuropathy 11/14/2021   Last Assessment & Plan:  Formatting of this note might be different from the original. Bilateral lower extremity cool to touch with diminished sensation Patient reports findings are chronic Continue home dose Lyrica  Formatting of this note might be different from the original. Last Assessment & Plan:  Formatting of this note might be different from the original. 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   Thrombocytopenia (HCC) 03/13/2020   Trochanteric bursitis of left hip 01/19/2021    Past Surgical History:  Procedure Laterality Date   CARDIAC CATHETERIZATION     CARDIAC SURGERY     CATARACT EXTRACTION, BILATERAL     COLONOSCOPY     [facility]   COLONOSCOPY N/A 07/04/2023   Procedure: COLONOSCOPY;  Surgeon: Elois Hair, MD;  Location: [facility] ENDOSCOPY;  Service: Gastroenterology;  Laterality: N/A;   CORONARY ARTERY BYPASS GRAFT     CORONARY STENT INTERVENTION N/A 10/07/2019   Procedure: CORONARY STENT INTERVENTION;   Surgeon: Arnoldo Lapping, MD;  Location: [facility] INVASIVE CV LAB;  Service: Cardiovascular;  Laterality: N/A;   ESOPHAGOGASTRODUODENOSCOPY N/A 07/04/2023   Procedure: EGD (ESOPHAGOGASTRODUODENOSCOPY);  Surgeon: Elois Hair, MD;  Location: [facility] ENDOSCOPY;  Service: Gastroenterology;  Laterality: N/A;   EYE SURGERY     HEMORROIDECTOMY     RIGHT/LEFT HEART CATH AND CORONARY/GRAFT ANGIOGRAPHY N/A 10/07/2019   Procedure: RIGHT/LEFT HEART CATH AND CORONARY/GRAFT ANGIOGRAPHY;  Surgeon: Arnoldo Lapping, MD;  Location: [facility] INVASIVE CV LAB;  Service: Cardiovascular;  Laterality: N/A;   TEE WITHOUT CARDIOVERSION N/A 10/27/2019   Procedure: TRANSESOPHAGEAL ECHOCARDIOGRAM (TEE);  Surgeon: Odie Benne, MD;  Location: [facility] INVASIVE CV LAB;  Service: Open Heart Surgery;  Laterality: N/A;   TRANSCATHETER AORTIC VALVE REPLACEMENT, TRANSFEMORAL N/A 10/27/2019   Procedure: TRANSCATHETER AORTIC VALVE REPLACEMENT, TRANSFEMORAL;  Surgeon: Odie Benne, MD;  Location: [facility] INVASIVE CV LAB;  Service: Open Heart Surgery;  Laterality: N/A;    Family History  Problem Relation Age of Onset   Cancer Mother        bone   Diabetes Mother    Kidney disease Mother    Stroke Mother    Sleep disorder Mother    Arthritis Father    Migraines Father    Hypertension Father    Sleep disorder Father    Arthritis Brother    Cancer Brother        throat   Throat cancer Daughter     Social History   Socioeconomic History   Marital status: Widowed    Spouse name: Juanita   Number of children: 2   Years of education: Not on file   Highest education level: 7th grade  Occupational History   Occupation: Retired    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  He has one son that lives with him and one daughter that lives beside of him. He is close with his family.  His sister cooks meals for him regularly.  He has friends at the cafe he visits regularly.   Social Drivers of Corporate investment banker Strain: Low Risk  (03/07/2023)   Overall Financial Resource Strain (CARDIA)    Difficulty of Paying Living Expenses: Not hard at all  Food Insecurity: No Food Insecurity (06/18/2023)   Hunger Vital Sign    Worried About Running Out of Food in the Last Year: Never true    Ran Out of Food in the Last Year: Never true  Transportation Needs: No Transportation Needs (06/18/2023)   PRAPARE - Administrator, Civil Service (Medical): No    Lack of Transportation (Non-Medical): No  Physical Activity: Insufficiently Active (03/07/2023)   Exercise Vital Sign    Days of Exercise per Week: 2 days    Minutes of Exercise per Session: 10 min  Stress: No Stress Concern Present (03/07/2023)   [employer] of Occupational Health - Occupational Stress Questionnaire    Feeling of Stress : Not at all  Social Connections: Moderately Isolated (03/07/2023)   Social Connection and Isolation Panel [NHANES]    Frequency of Communication with Friends and Family: More than three times a week    Frequency of Social Gatherings with Friends and Family: More than three times a week    Attends Religious Services: 1 to 4 times per year    Active Member of [employer] or Organizations: No    Attends Banker Meetings: Never    Marital Status: Widowed  Intimate Partner Violence: Not At Risk (06/18/2023)   Humiliation, Afraid, Rape, and Kick questionnaire    Fear of Current or Ex-Partner: No    Emotionally Abused: No    Physically Abused: No     Sexually Abused: No    Outpatient Medications Prior to Visit  Medication Sig Dispense Refill   acetaminophen  (TYLENOL ) 650 MG CR tablet Take 650 mg by mouth every 8 (eight) hours as needed for pain.  albuterol  (VENTOLIN  HFA) 108 (90 Base) MCG/ACT inhaler Inhale 2 puffs into the lungs every 2 (two) hours as needed for wheezing or shortness of breath. 6.7 g 6   amLODipine  (NORVASC ) 5 MG tablet Take 5 mg by mouth daily.     ammonium lactate  (LAC-HYDRIN ) 12 % lotion Apply 1 Application topically 2 (two) times daily. Apply twice daily to arms and hands to increase moisture and reduce easy bruising.  Can also be used safely on neck, chest, and legs as needed 400 g 0   ascorbic acid  (VITAMIN C) 500 MG tablet Take 1 tablet (500 mg total) by mouth daily. 30 tablet 0   aspirin  EC 81 MG tablet Take 1 tablet (81 mg total) by mouth daily. Swallow whole. 90 tablet 1   carbidopa -levodopa  (SINEMET ) 25-100 MG tablet Take 1 tablet by mouth at bedtime as needed (rESTLESS LEGS.). 90 tablet 1   carvedilol  (COREG ) 25 MG tablet Take 1 tablet (25 mg total) by mouth 2 (two) times daily. 60 tablet 3   cholecalciferol  (VITAMIN D3) 25 MCG (1000 UNIT) tablet Take 1 tablet (1,000 Units total) by mouth daily. 90 tablet 1   ezetimibe  (ZETIA ) 10 MG tablet Take 1 tablet (10 mg total) by mouth daily. 90 tablet 1   fluorometholone (FML) 0.1 % ophthalmic suspension Place 1 drop into both eyes daily.     meclizine  (ANTIVERT ) 25 MG tablet Take 1 tablet (25 mg total) by mouth every 6 (six) hours. 30 tablet 1   neomycin-polymyxin b-dexamethasone  (MAXITROL) 3.5-10000-0.1 SUSP Place 1 drop into both eyes every 6 (six) hours.     olmesartan  (BENICAR ) 40 MG tablet Take 1 tablet (40 mg total) by mouth daily. 90 tablet 1   pantoprazole  (PROTONIX ) 40 MG tablet Take 1 tablet (40 mg total) by mouth daily. 100 tablet 1   rosuvastatin  (CRESTOR ) 20 MG tablet Take 1 tablet (20 mg total) by mouth at bedtime. 90 tablet 3   sertraline  (ZOLOFT ) 50  MG tablet Take 1 tablet (50 mg total) by mouth daily. 90 tablet 1   Sodium Sulfate-Mag Sulfate-KCl (SUTAB ) 1479-225-188 MG TABS Use as directed for colonoscopy. MANUFACTURER CODES!! BIN: [id number] PCN: CN GROUP: [id number] MEMBER ID: [id number];LOV AS SECONDARY INSURANCE ;NO PRIOR AUTHORIZATION 24 tablet 0   spironolactone  (ALDACTONE ) 25 MG tablet Take 1 tablet (25 mg total) by mouth once daily 30 tablet 3   vitamin B-12 (CYANOCOBALAMIN ) 500 MCG tablet Take 500 mcg by mouth daily.     zinc  sulfate 220 (50 Zn) MG capsule Take 1 capsule (220 mg total) by mouth daily. 90 capsule 1   gabapentin  (NEURONTIN ) 300 MG capsule Take 1 capsule (300 mg total) by mouth 3 (three) times daily. 90 capsule 0   furosemide  (LASIX ) 80 MG tablet Take 1 tablet (80 mg total) by mouth daily. 90 tablet 3   nitroGLYCERIN  (NITROSTAT ) 0.4 MG SL tablet Place 1 tablet (0.4 mg total) under the tongue every 5 (five) minutes as needed for chest pain. 25 tablet 2   potassium chloride  (KLOR-CON ) 10 MEQ tablet Take 1 tablet (10 mEq total) by mouth daily. 90 tablet 3   No facility-administered medications prior to visit.    Allergies  Allergen Reactions   Poison Oak Extract Rash    Review of Systems  Constitutional:  Negative for appetite change, fatigue and fever.  HENT:  Negative for congestion, ear pain, sinus pressure and sore throat.   Respiratory:  Negative for cough, chest tightness, shortness of breath and wheezing.   Cardiovascular:  Negative for chest pain and palpitations.  Gastrointestinal:  Negative for abdominal pain, constipation, diarrhea, nausea and vomiting.  Genitourinary:  Negative for dysuria and hematuria.  Musculoskeletal:  Negative for arthralgias, back pain, joint swelling and myalgias.  Skin:  Negative for rash.  Neurological:  Negative for dizziness, weakness and headaches.  Psychiatric/Behavioral:  Negative for dysphoric mood. The patient is not nervous/anxious.        Objective:         07/19/2023    9:47 AM 07/12/2023    2:44 PM 07/12/2023    1:53 PM  Vitals with BMI  Height 5\' 4"     Weight 147 lbs    BMI 25.22    Systolic 150 100 94  Diastolic 72 71 77  Pulse 71 80 84    Orthostatic VS for the past 72 hrs (Last 3 readings):  Patient Position BP Location  07/19/23 0947 Sitting Right Arm     Physical Exam Vitals reviewed.  Constitutional:      Appearance: Normal appearance.  Cardiovascular:     Rate and Rhythm: Normal rate and regular rhythm.     Heart sounds: Normal heart sounds.  Pulmonary:     Effort: Pulmonary effort is normal.     Breath sounds: Normal breath sounds.  Abdominal:     General: Bowel sounds are normal.     Palpations: Abdomen is soft.     Tenderness: There is no abdominal tenderness.  Neurological:     Mental Status: He is alert and oriented to person, place, and time.     Motor: Tremor present.     Comments: Resting tremor noted No improvement with finger to nose Possible onset of parkinsons  Psychiatric:        Mood and Affect: Mood normal.        Behavior: Behavior normal.     There are no preventive care reminders to display for this patient.  There are no preventive care reminders to display for this patient.   No results found for: "TSH" Lab Results  Component Value Date   WBC 16.2 (H) 07/01/2023   HGB 9.3 (L) 07/01/2023   HCT 28.9 (L) 07/01/2023   MCV 86.3 07/01/2023   PLT 428 (H) 07/01/2023   Lab Results  Component Value Date   NA 141 07/01/2023   K 3.0 (L) 07/01/2023   CO2 22 07/01/2023   GLUCOSE 173 (H) 07/01/2023   BUN 31 (H) 07/01/2023   CREATININE 1.54 (H) 07/01/2023   BILITOT 0.9 07/01/2023   ALKPHOS 120 07/01/2023   AST 13 (L) 07/01/2023   ALT <5 07/01/2023   PROT 5.8 (L) 07/01/2023   ALBUMIN 3.6 07/01/2023   CALCIUM  9.0 07/01/2023   ANIONGAP 17 (H) 07/01/2023   EGFR 48.0 06/12/2023   Lab Results  Component Value Date   CHOL 106 05/08/2023   Lab Results  Component Value Date   HDL 27 (L)  05/08/2023   Lab Results  Component Value Date   LDLCALC 58 05/08/2023   Lab Results  Component Value Date   TRIG 112 05/08/2023   Lab Results  Component Value Date   CHOLHDL 3.9 05/08/2023   Lab Results  Component Value Date   HGBA1C 5.3 05/08/2023       Assessment & Plan:  Carpal tunnel syndrome, bilateral Assessment & Plan: Severe symptoms with scheduled specialist evaluation. - Attend hand specialist appointment on May 5th. -Increased difficulty grabbing objects -Will add this to home health order  Orders: -  Pregabalin ; Take 1 capsule (75 mg total) by mouth 2 (two) times daily.  Dispense: 90 capsule; Refill: 0 -     Triamcinolone  Acetonide  Generalized weakness Assessment & Plan: Continued weakness and fatigue Increased difficulty with grabbing and holding objects Admits to weakness in shoulders and stiffness in neck Will add these to home health order.  Orders: -     POCT glucose (manual entry)  Neck pain Assessment & Plan: Sharp neck pain with stiffness and popping, likely inflammatory. Previous shoulder injection provided temporary relief. - Administer 80 mg Kenalog  injection. - Discontinue gabapentin . - Start Lyrica  75 mg twice daily.   Acute bursitis of left shoulder Assessment & Plan: Shoulder pain possibly linked to neck inflammation. Previous injection provided temporary relief. - Administer 80 mg Kenalog  injection. - Discontinue gabapentin . - Start Lyrica  75 mg twice daily.   Resting tremor Assessment & Plan: Resting tremor possibly due to neurological issues. No family history of Parkinson's disease. - Monitor tremor. - Evaluate for Parkinson's if tremor persists after pain management.   Iron deficiency anemia due to chronic blood loss Assessment & Plan: Under hematologist care. Normal colonoscopy and EGD. Elevated B12, supplementation discontinued.       Hypertensive heart disease with heart failure (HCC) Assessment &  Plan: Elevated blood pressure possibly due to missed medication. - Ensure adherence to antihypertensive regimen.      Meds ordered this encounter  Medications   pregabalin  (LYRICA ) 75 MG capsule    Sig: Take 1 capsule (75 mg total) by mouth 2 (two) times daily.    Dispense:  90 capsule    Refill:  0   triamcinolone  acetonide (KENALOG -40) injection 80 mg    Orders Placed This Encounter  Procedures   POCT glucose (manual entry)    Follow-up: Return if symptoms worsen or fail to improve.  An After Visit Summary was printed and given to the patient.   I,Lauren M Auman,acting as a Neurosurgeon for [employer], [employer].,have documented all relevant documentation on the behalf of [name], PA,as directed by  [name], PA while in the presence of [name], [place].    [name], [place] [facility] [phone]

## 2023-07-19 NOTE — Patient Instructions (Signed)
 VISIT SUMMARY:  During today's visit, we discussed your worsening symptoms of anxiety, depression, and insomnia. You reported difficulty sleeping, feeling down and unmotivated, and experiencing a racing heart when lying down. We also addressed your need for a prescription for dicyclomine for gastrointestinal issues.  YOUR PLAN:  -GENERALIZED ANXIETY DISORDER: Generalized anxiety disorder is a condition characterized by excessive, uncontrollable worry about various aspects of life. We discussed starting you on Vraylar, with the possibility of combining it with Prozac if needed. You will also take Xanax at night to help with sleep. We will conduct blood work to check your kidney and liver function, electrolytes, A1c, cholesterol, B12, folate, vitamin D , and thyroid  function.  -INSOMNIA DUE TO ANXIETY: Insomnia is difficulty falling or staying asleep, often caused by anxiety. To help you sleep better, you will take Xanax at night, starting with one tablet and increasing to two if needed.  -DEPRESSION: Depression is a mood disorder that causes persistent feelings of sadness and loss of interest. We will start you on Vraylar, and if it is not effective enough, we may add Prozac to your treatment.  -IRRITABLE BOWEL SYNDROME: Irritable bowel syndrome (IBS) is a gastrointestinal disorder causing cramping and discomfort. You will take dicyclomine as needed to manage these symptoms.  -GENERAL HEALTH MAINTENANCE: We discussed routine health maintenance, including vaccinations and screenings. We will offer a tetanus vaccination at your next visit and discuss options for the HPV vaccine and chlamydia screening.  INSTRUCTIONS:  Please schedule a follow-up appointment in one month to assess your response to the new medications and review your blood work results. Use MyChart to report any issues or side effects with your medications.

## 2023-07-20 ENCOUNTER — Telehealth: Payer: Self-pay

## 2023-07-20 DIAGNOSIS — G252 Other specified forms of tremor: Secondary | ICD-10-CM

## 2023-07-20 DIAGNOSIS — M542 Cervicalgia: Secondary | ICD-10-CM

## 2023-07-20 HISTORY — DX: Cervicalgia: M54.2

## 2023-07-20 HISTORY — DX: Other specified forms of tremor: G25.2

## 2023-07-20 NOTE — Telephone Encounter (Signed)
 Copied from CRM [id number]. Topic: Referral - Status >> Jul 19, 2023  3:24 PM Rennis Case wrote: Reason for CRM: Willetta Harpin w/ [facility], Received referral on patient and the diagnosis cannot be used, as its an older diagnosis and not active.   Referral mentioned patient having hand,  shoulder and neck pain, can use that if cause is explained. Cause of pain would have to be put into the note.   Requesting referral w/ updated diagnosis to be faxed to [place] [facility] home health

## 2023-07-20 NOTE — Assessment & Plan Note (Addendum)
 Severe symptoms with scheduled specialist evaluation. - Attend hand specialist appointment on May 5th. -Increased difficulty grabbing objects -Will add this to home health order

## 2023-07-20 NOTE — Assessment & Plan Note (Signed)
 Resting tremor possibly due to neurological issues. No family history of Parkinson's disease. - Monitor tremor. - Evaluate for Parkinson's if tremor persists after pain management.

## 2023-07-20 NOTE — Assessment & Plan Note (Signed)
 Elevated blood pressure possibly due to missed medication. - Ensure adherence to antihypertensive regimen.

## 2023-07-20 NOTE — Assessment & Plan Note (Signed)
 Sharp neck pain with stiffness and popping, likely inflammatory. Previous shoulder injection provided temporary relief. - Administer 80 mg Kenalog  injection. - Discontinue gabapentin . - Start Lyrica  75 mg twice daily.

## 2023-07-20 NOTE — Assessment & Plan Note (Signed)
 Under hematologist care. Normal colonoscopy and EGD. Elevated B12, supplementation discontinued.

## 2023-07-20 NOTE — Assessment & Plan Note (Signed)
 Shoulder pain possibly linked to neck inflammation. Previous injection provided temporary relief. - Administer 80 mg Kenalog  injection. - Discontinue gabapentin . - Start Lyrica  75 mg twice daily.

## 2023-07-20 NOTE — Assessment & Plan Note (Signed)
 Continued weakness and fatigue Increased difficulty with grabbing and holding objects Admits to weakness in shoulders and stiffness in neck Will add these to home health order.

## 2023-07-24 NOTE — Telephone Encounter (Signed)
 I called [facility] and they received the new diagnosis. They will look up and see if they will able to accept the referral.

## 2023-07-25 ENCOUNTER — Telehealth: Payer: Self-pay

## 2023-07-25 ENCOUNTER — Other Ambulatory Visit: Payer: Self-pay | Admitting: Physician Assistant

## 2023-07-25 DIAGNOSIS — Z79899 Other long term (current) drug therapy: Secondary | ICD-10-CM | POA: Diagnosis not present

## 2023-07-25 DIAGNOSIS — G2581 Restless legs syndrome: Secondary | ICD-10-CM | POA: Diagnosis not present

## 2023-07-25 DIAGNOSIS — I252 Old myocardial infarction: Secondary | ICD-10-CM | POA: Diagnosis not present

## 2023-07-25 DIAGNOSIS — I700 Atherosclerosis of aorta: Secondary | ICD-10-CM | POA: Diagnosis not present

## 2023-07-25 DIAGNOSIS — Z951 Presence of aortocoronary bypass graft: Secondary | ICD-10-CM | POA: Diagnosis not present

## 2023-07-25 DIAGNOSIS — M542 Cervicalgia: Secondary | ICD-10-CM | POA: Diagnosis not present

## 2023-07-25 DIAGNOSIS — D696 Thrombocytopenia, unspecified: Secondary | ICD-10-CM | POA: Diagnosis not present

## 2023-07-25 DIAGNOSIS — M48062 Spinal stenosis, lumbar region with neurogenic claudication: Secondary | ICD-10-CM | POA: Diagnosis not present

## 2023-07-25 DIAGNOSIS — K219 Gastro-esophageal reflux disease without esophagitis: Secondary | ICD-10-CM | POA: Diagnosis not present

## 2023-07-25 DIAGNOSIS — M7062 Trochanteric bursitis, left hip: Secondary | ICD-10-CM | POA: Diagnosis not present

## 2023-07-25 DIAGNOSIS — I110 Hypertensive heart disease with heart failure: Secondary | ICD-10-CM | POA: Diagnosis not present

## 2023-07-25 DIAGNOSIS — Z955 Presence of coronary angioplasty implant and graft: Secondary | ICD-10-CM | POA: Diagnosis not present

## 2023-07-25 DIAGNOSIS — F03B18 Unspecified dementia, moderate, with other behavioral disturbance: Secondary | ICD-10-CM | POA: Diagnosis not present

## 2023-07-25 DIAGNOSIS — M21372 Foot drop, left foot: Secondary | ICD-10-CM | POA: Diagnosis not present

## 2023-07-25 DIAGNOSIS — G4730 Sleep apnea, unspecified: Secondary | ICD-10-CM | POA: Diagnosis not present

## 2023-07-25 DIAGNOSIS — E785 Hyperlipidemia, unspecified: Secondary | ICD-10-CM | POA: Diagnosis not present

## 2023-07-25 DIAGNOSIS — G5603 Carpal tunnel syndrome, bilateral upper limbs: Secondary | ICD-10-CM | POA: Diagnosis not present

## 2023-07-25 DIAGNOSIS — F03B3 Unspecified dementia, moderate, with mood disturbance: Secondary | ICD-10-CM | POA: Diagnosis not present

## 2023-07-25 DIAGNOSIS — G629 Polyneuropathy, unspecified: Secondary | ICD-10-CM | POA: Diagnosis not present

## 2023-07-25 DIAGNOSIS — M21371 Foot drop, right foot: Secondary | ICD-10-CM | POA: Diagnosis not present

## 2023-07-25 DIAGNOSIS — G252 Other specified forms of tremor: Secondary | ICD-10-CM | POA: Diagnosis not present

## 2023-07-25 DIAGNOSIS — D500 Iron deficiency anemia secondary to blood loss (chronic): Secondary | ICD-10-CM | POA: Diagnosis not present

## 2023-07-25 DIAGNOSIS — F32A Depression, unspecified: Secondary | ICD-10-CM | POA: Diagnosis not present

## 2023-07-25 DIAGNOSIS — M1990 Unspecified osteoarthritis, unspecified site: Secondary | ICD-10-CM | POA: Diagnosis not present

## 2023-07-25 DIAGNOSIS — M7552 Bursitis of left shoulder: Secondary | ICD-10-CM | POA: Diagnosis not present

## 2023-07-25 DIAGNOSIS — I5032 Chronic diastolic (congestive) heart failure: Secondary | ICD-10-CM | POA: Diagnosis not present

## 2023-07-25 DIAGNOSIS — Z8673 Personal history of transient ischemic attack (TIA), and cerebral infarction without residual deficits: Secondary | ICD-10-CM | POA: Diagnosis not present

## 2023-07-25 DIAGNOSIS — I2510 Atherosclerotic heart disease of native coronary artery without angina pectoris: Secondary | ICD-10-CM | POA: Diagnosis not present

## 2023-07-25 MED ORDER — MECLIZINE HCL 25 MG PO TABS: 25.0000 mg | ORAL_TABLET | Freq: Four times a day (QID) | ORAL | 1 refills | Status: DC

## 2023-07-25 NOTE — Telephone Encounter (Signed)
 Copied from CRM [id number]. Topic: Clinical - Medication Refill >> Jul 25, 2023  2:51 PM Lorenz Romano B wrote: Most Recent Primary Care Visit:  Provider: [name]  Department: [facility]  Visit Type: ACUTE  Date: 07/19/2023  Medication: meclizine  (ANTIVERT ) 25 MG tablet   Has the patient contacted their pharmacy? Yes (Agent: If no, request that the patient contact the pharmacy for the refill. If patient does not wish to contact the pharmacy document the reason why and proceed with request.) (Agent: If yes, when and what did the pharmacy advise?)  Is this the correct pharmacy for this prescription? Yes If no, delete pharmacy and type the correct one.  This is the patient's preferred pharmacy:  [facility] - [place] - [place], [place] - [address] [place] [address] Phone: [phone] Fax: [phone]    Has the prescription been filled recently? Yes  Is the patient out of the medication? Yes  Has the patient been seen for an appointment in the last year OR does the patient have an upcoming appointment? Yes  Can we respond through MyChart? No  Agent: Please be advised that Rx refills may take up to 3 business days. We ask that you follow-up with your pharmacy.

## 2023-07-25 NOTE — Telephone Encounter (Signed)
 Copied from CRM [id number]. Topic: Clinical - Home Health Verbal Orders >> Jul 25, 2023  2:37 PM Tiffany S wrote: Caller/Agency: [facility] Callback Number: [phone] Service Requested: Skilled Nursing Frequency: twice a week for 2 weeks once a week for 5 weeks and 2 prn Bath aide 2 weeks for 4 weeks eval for Child psychotherapist and OT  Any new concerns about the patient? No

## 2023-07-29 ENCOUNTER — Ambulatory Visit: Payer: Self-pay

## 2023-07-29 DIAGNOSIS — G5603 Carpal tunnel syndrome, bilateral upper limbs: Secondary | ICD-10-CM | POA: Diagnosis not present

## 2023-07-29 NOTE — Telephone Encounter (Signed)
 Spoke with Jonothan Neve and stated the below orders were okay.  Copied from CRM [id number]. Topic: Clinical - Home Health Verbal Orders >> Jul 29, 2023  3:51 PM Baldemar Lev wrote: Caller/Agency: Jonothan Neve from [facility]  Callback Number: [phone] Service Requested: Physical Therapy Frequency:   2w4 1w4   For home exercises, gait training, safety education Any new concerns about the patient?   "No but he has been very weak and has been falling"   Sending information to Nurse Triage for fall red word.

## 2023-07-29 NOTE — Telephone Encounter (Signed)
 Copied from CRM [id number]. Topic: Clinical - Red Word Triage >> Jul 29, 2023  3:54 PM Baldemar Lev wrote: Red Word that prompted transfer to Nurse Triage: Home Health PT reports that the patient has been falling. Not currently with patient, saw him Friday.  Chief Complaint: fall Symptoms:pt stated had an episode of vertigo that caused he to be unsteady on feet and fall. Cut left arm & hand top of head when hit dresser. Frequency: x 1 week ago  Pertinent Negatives: Patient denies pain/foreness Disposition: [] ED /[] Urgent Care (no appt availability in office) / [] Appointment(In office/virtual)/ []  [facility]/ [] Home Care/ [] Refused Recommended Disposition /[] [place] [place] [facility]/ [x]  Follow-up with PCP Additional Notes: pt injured: but did not black out. Pt stated he is no longer longer having soreness or pain.  Pt needs a refill on vertigo medication.  Answer Assessment - Initial Assessment Questions 1. MECHANISM: "How did the fall happen?"     Vertigo caused pt to fall 2. DOMESTIC VIOLENCE AND ELDER ABUSE SCREENING: "Did you fall because someone pushed you or tried to hurt you?" If Yes, ask: "Are you safe now?"     no 3. ONSET: "When did the fall happen?" (e.g., minutes, hours, or days ago)     X week 4. LOCATION: "What part of the body hit the ground?" (e.g., back, buttocks, head, hips, knees, hands, head, stomach)     Cut left arm & hand top of head 5. INJURY: "Did you hurt (injure) yourself when you fell?" If Yes, ask: "What did you injure? Tell me more about this?" (e.g., body area; type of injury; pain severity)"    Cut left arm & hand top of head 6. PAIN: "Is there any pain?" If Yes, ask: "How bad is the pain?" (e.g., Scale 1-10; or mild,  moderate, severe)   - NONE (0): No pain   - MILD (1-3): Doesn't interfere with normal activities    - MODERATE (4-7): Interferes with normal activities or awakens from sleep    - SEVERE (8-10): Excruciating pain, unable to do any normal  activities      No  7. SIZE: For cuts, bruises, or swelling, ask: "How large is it?" (e.g., inches or centimeters)      N/a 8. PREGNANCY: "Is there any chance you are pregnant?" "When was your last menstrual period?"     N.a 9. OTHER SYMPTOMS: "Do you have any other symptoms?" (e.g., dizziness, fever, weakness; new onset or worsening).      Weakness last 6 months & neuropathy 10. CAUSE: "What do you think caused the fall (or falling)?" (e.g., tripped, dizzy spell)       no  Protocols used: Falls and [facility]

## 2023-07-29 NOTE — Telephone Encounter (Signed)
 Verbal given to [facility] for patient

## 2023-07-30 ENCOUNTER — Other Ambulatory Visit: Payer: Self-pay | Admitting: Physician Assistant

## 2023-07-30 DIAGNOSIS — R42 Dizziness and giddiness: Secondary | ICD-10-CM

## 2023-07-30 MED ORDER — MECLIZINE HCL 25 MG PO TABS: 25.0000 mg | ORAL_TABLET | Freq: Four times a day (QID) | ORAL | 3 refills | Status: DC

## 2023-07-30 NOTE — Telephone Encounter (Signed)
 Spoke with son and he states he is very weak but the shot you gave patient has helped but also the steroid shot In the right hand is working pretty good as he saw a hand specialist yesterday and the Doctor wanted to try steroid injection in just one hand to see if it would help.   Son states PT came out and done a " vertigo test" and patient failed.   Son states fall occurred 2 weeks ago.

## 2023-08-01 ENCOUNTER — Other Ambulatory Visit: Payer: Self-pay | Admitting: Family Medicine

## 2023-08-06 ENCOUNTER — Ambulatory Visit: Payer: HMO | Admitting: Physician Assistant

## 2023-08-07 ENCOUNTER — Other Ambulatory Visit: Payer: Self-pay | Admitting: Family Medicine

## 2023-08-07 ENCOUNTER — Encounter: Payer: Self-pay | Admitting: Family Medicine

## 2023-08-07 ENCOUNTER — Telehealth: Payer: Self-pay

## 2023-08-07 NOTE — Telephone Encounter (Signed)
 [facility] health sent over fax asking including a list of medications that was on patient's medication list that the patient is not taking. Per [name] PAC there is only three in the list that patient still needs to be taking which is Vitamin D , Olmesartan  20 mg daily, and zinc  OTC. I called and left message back with Elvin Hammer which is the person that sent the fax at [phone] and left message stating which medications that the patient needed to continue per [facility].

## 2023-08-09 ENCOUNTER — Other Ambulatory Visit: Payer: Self-pay | Admitting: Family Medicine

## 2023-08-12 ENCOUNTER — Telehealth: Payer: Self-pay | Admitting: Physician Assistant

## 2023-08-12 NOTE — Telephone Encounter (Signed)
 [facility] HOME HEALTH - PLAN OF CARE- [facility] DATE 07/25/2023

## 2023-08-13 DIAGNOSIS — M21371 Foot drop, right foot: Secondary | ICD-10-CM | POA: Diagnosis not present

## 2023-08-13 DIAGNOSIS — M21372 Foot drop, left foot: Secondary | ICD-10-CM | POA: Diagnosis not present

## 2023-08-13 DIAGNOSIS — M1990 Unspecified osteoarthritis, unspecified site: Secondary | ICD-10-CM | POA: Diagnosis not present

## 2023-08-13 DIAGNOSIS — G2581 Restless legs syndrome: Secondary | ICD-10-CM | POA: Diagnosis not present

## 2023-08-13 DIAGNOSIS — M48062 Spinal stenosis, lumbar region with neurogenic claudication: Secondary | ICD-10-CM | POA: Diagnosis not present

## 2023-08-13 DIAGNOSIS — G4730 Sleep apnea, unspecified: Secondary | ICD-10-CM | POA: Diagnosis not present

## 2023-08-13 DIAGNOSIS — I700 Atherosclerosis of aorta: Secondary | ICD-10-CM | POA: Diagnosis not present

## 2023-08-13 DIAGNOSIS — I2510 Atherosclerotic heart disease of native coronary artery without angina pectoris: Secondary | ICD-10-CM | POA: Diagnosis not present

## 2023-08-13 DIAGNOSIS — G629 Polyneuropathy, unspecified: Secondary | ICD-10-CM | POA: Diagnosis not present

## 2023-08-13 DIAGNOSIS — D500 Iron deficiency anemia secondary to blood loss (chronic): Secondary | ICD-10-CM | POA: Diagnosis not present

## 2023-08-13 DIAGNOSIS — F03B18 Unspecified dementia, moderate, with other behavioral disturbance: Secondary | ICD-10-CM | POA: Diagnosis not present

## 2023-08-13 DIAGNOSIS — G5603 Carpal tunnel syndrome, bilateral upper limbs: Secondary | ICD-10-CM | POA: Diagnosis not present

## 2023-08-16 ENCOUNTER — Telehealth: Payer: Self-pay

## 2023-08-16 NOTE — Telephone Encounter (Signed)
 Occupational therapist, Adair Hollingshead, called asking for verbal for continue therapy on patient. [name], PA-C was agreed.

## 2023-08-26 ENCOUNTER — Other Ambulatory Visit: Payer: Self-pay | Admitting: Physician Assistant

## 2023-08-26 ENCOUNTER — Ambulatory Visit: Payer: Self-pay

## 2023-08-26 ENCOUNTER — Telehealth: Payer: Self-pay | Admitting: Physician Assistant

## 2023-08-26 DIAGNOSIS — G5603 Carpal tunnel syndrome, bilateral upper limbs: Secondary | ICD-10-CM

## 2023-08-26 MED ORDER — PREGABALIN 75 MG PO CAPS: 75.0000 mg | ORAL_CAPSULE | Freq: Two times a day (BID) | ORAL | 0 refills | Status: DC

## 2023-08-26 NOTE — Telephone Encounter (Signed)
 [facility] Supplemental Orders from 08/15/23 to 09/01/23.

## 2023-08-26 NOTE — Telephone Encounter (Signed)
 Copied from CRM [id number]. Topic: Clinical - Medication Refill >> Aug 26, 2023 12:17 PM [name] H wrote: Medication: pregabalin  (LYRICA ) 75 MG capsule  Has the patient contacted their pharmacy? Yes (Agent: If no, request that the patient contact the pharmacy for the refill. If patient does not wish to contact the pharmacy document the reason why and proceed with request.) (Agent: If yes, when and what did the pharmacy advise?) Has no refills  This is the patient's preferred pharmacy:  [facility] - [place] - [place], [place] - [address] [place] [address] Phone: [phone] Fax: [phone]    Is this the correct pharmacy for this prescription? Yes If no, delete pharmacy and type the correct one.   Has the prescription been filled recently? No  Is the patient out of the medication? No  Has the patient been seen for an appointment in the last year OR does the patient have an upcoming appointment? Yes  Can we respond through MyChart? Yes  Agent: Please be advised that Rx refills may take up to 3 business days. We ask that you follow-up with your pharmacy.

## 2023-08-26 NOTE — Telephone Encounter (Addendum)
  Chief Complaint: fall Symptoms: pain   Disposition: [] ED /[] Urgent Care (no appt availability in office) / [] Appointment(In office/virtual)/ []  [facility]/ [] Home Care/ [] Refused Recommended Disposition /[] [place] [facility]/ [x]  Follow-up with PCP Additional Notes: Nils Baseman from [facility] called to report a fall from yesterday. Pt lost balance getting out of car and fell over brick carport wall and landed on tailbone/shoulder. Nils Baseman stated pt is "always in pain. It's always 10/10." Pt shared with Nils Baseman there is bruisng in area but didn't want to show her. Nils Baseman was able to get up and moving around with minimal trouble.   RN attempted to call Blaise Bumps, [place], to get more info. Unable to reach [place]. Voicemail was left. Please advise.             Copied from CRM [id number]. Topic: Clinical - Red Word Triage >> Aug 26, 2023 12:24 PM Elle L wrote: Red Word that prompted transfer to Nurse Triage: Nils Baseman, Occupational Therapist with [facility], [phone], was calling to report that the patient fell out of the car yesterday and hit his tailbone on the brick and landed on his shoulder. He has bruising on his bottom and a scrape but did not bleed. He complained of 10/10 pain today. Answer Assessment - Initial Assessment Questions 1. MECHANISM: "How did the fall happen?"     Marvell Slider out of car 2. DOMESTIC VIOLENCE AND ELDER ABUSE SCREENING: "Did you fall because someone pushed you or tried to hurt you?" If Yes, ask: "Are you safe now?"     Na  3. ONSET: "When did the fall happen?" (e.g., minutes, hours, or days ago)     Yesterday  4. LOCATION: "What part of the body hit the ground?" (e.g., back, buttocks, head, hips, knees, hands, head, stomach)     Tailbone  5. INJURY: "Did you hurt (injure) yourself when you fell?" If Yes, ask: "What did you injure? Tell me more about this?" (e.g., body area; type of injury; pain severity)"     yes 6. PAIN: "Is there any pain?" If Yes,  ask: "How bad is the pain?" (e.g., Scale 1-10; or mild,  moderate, severe)   - NONE (0): No pain   - MILD (1-3): Doesn't interfere with normal activities    - MODERATE (4-7): Interferes with normal activities or awakens from sleep    - SEVERE (8-10): Excruciating pain, unable to do any normal activities      Not sure  7. SIZE: For cuts, bruises, or swelling, ask: "How large is it?" (e.g., inches or centimeters)      Bruised on tailbone   9. OTHER SYMPTOMS: "Do you have any other symptoms?" (e.g., dizziness, fever, weakness; new onset or worsening).      Not sure  10. CAUSE: "What do you think caused the fall (or falling)?" (e.g., tripped, dizzy spell)       Lost balance  Protocols used: Falls and [facility]

## 2023-08-26 NOTE — Telephone Encounter (Signed)
 Spoke with terry, patient's son,  he is coming Wednesday at 1040.

## 2023-08-27 NOTE — Progress Notes (Unsigned)
 Acute Office Visit  Subjective:    Patient ID: Jack Hodges, male    DOB: 1930-11-28, 89 y.o.   MRN: [id number]  No chief complaint on file.   HPI: Patient is in today for ***  Past Medical History:  Diagnosis Date   Acute bursitis of left shoulder 01/04/2020   Acute on chronic diastolic congestive heart failure (HCC) 11/13/2021   Last Assessment & Plan:  Formatting of this note is different from the original. Pertinent Data:   Current medication includes: furosemide  - 20 mg lisinopriL  - 10 mg metoprolol  succinate - 25 mg.  BNP (pg/mL)  Date Value  11/15/2021 433.6 (H)   eGFR (mL/min/1.24m*2)  Date Value  11/15/2021 >60.0    BP Readings from Last 3 Encounters:  11/30/21 (!) 147/82  11/15/21 (!) 109/54  11/13/21 (!) 134/74     Acute respiratory failure with hypoxia (HCC) 03/17/2020   Angina pectoris (HCC) 10/07/2019   Arthritis    Atherosclerosis of aorta (HCC) 09/24/2019   Bilateral foot-drop 01/18/2017   BMI 27.0-27.9,adult 09/24/2019   Coronary artery disease involving native coronary artery of native heart without angina pectoris 11/01/2015   Overview:  Bypass surgery in 1996 Overview:  Overview:  Bypass surgery in 1996  Last Assessment & Plan:  Continue his current regimen.  Follow up as noted.   CVA (cerebral vascular accident) (HCC) 04/03/2016   Last Assessment & Plan:  The patient underwent extensive CVA workup.  MRI is negative.  He does have aortic stenosis by echocardiogram.  Upon review of Care everywhere he is followed by Dr. Gordan Latina, cardiologist for this.  Carotid ultrasound negative for hemodynamically significant stenosis.  Lipid panel within normal limits.  He has not had any further episodes of dizziness or nausea vomiting.    Demand ischemia (HCC) 11/14/2021   Last Assessment & Plan:  Formatting of this note might be different from the original. Troponin flat No anginal symptoms Formatting of this note might be different from the original. Last Assessment & Plan:   Formatting of this note might be different from the original. Troponin flat No anginal symptoms   Dementia (HCC)    Depression    DNR (do not resuscitate) 04/03/2016   Overview:  I had an extensive discussion with the patient on 04/03/2016 regarding his end of life wishes.  He and his daughter agree that do not resuscitate is in keeping with his beliefs.   Dyslipidemia 11/01/2015   Last Assessment & Plan:  Formatting of this note might be different from the original. Cholesterol 136   Triglycerides  83  HDL  50.1  LDL Calculated  69  VLDL Cholesterol Cal  17   Continue statin.   Dyspnea    with activity   Essential hypertension 11/01/2015   Last Assessment & Plan:  Resume home medications.  Follow-up as noted above.   Generalized weakness 03/13/2020   GERD (gastroesophageal reflux disease)    Herpes zoster 05/19/2019   History of aortic stenosis 03/11/2020   History of CVA (cerebrovascular accident) 11/14/2021   Last Assessment & Plan:  Formatting of this note might be different from the original. Neurologically intact upon assessment -continue aspirin , statin   Hypertensive heart disease with heart failure (HCC) 11/01/2015   Last Assessment & Plan:  Resume home medications.  Follow-up as noted above.   Myocardial infarction (HCC)    approx 1994   Neuropathy    Peripheral neuropathy 11/14/2021   Last Assessment & Plan:  Formatting of  this note might be different from the original. Bilateral lower extremity cool to touch with diminished sensation Patient reports findings are chronic Continue home dose Lyrica  Formatting of this note might be different from the original. Last Assessment & Plan:  Formatting of this note might be different from the original. 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   Thrombocytopenia (HCC) 03/13/2020   Trochanteric bursitis of left hip 01/19/2021    Past Surgical History:  Procedure Laterality Date   CARDIAC CATHETERIZATION     CARDIAC SURGERY     CATARACT EXTRACTION, BILATERAL     COLONOSCOPY     [facility]   COLONOSCOPY N/A 07/04/2023   Procedure: COLONOSCOPY;  Surgeon: Elois Hair, MD;  Location: [facility] ENDOSCOPY;  Service: Gastroenterology;  Laterality: N/A;   CORONARY ARTERY BYPASS GRAFT     CORONARY STENT INTERVENTION N/A 10/07/2019   Procedure: CORONARY STENT INTERVENTION;  Surgeon: Arnoldo Lapping, MD;  Location: [facility] INVASIVE CV LAB;  Service: Cardiovascular;  Laterality: N/A;   ESOPHAGOGASTRODUODENOSCOPY N/A 07/04/2023   Procedure: EGD (ESOPHAGOGASTRODUODENOSCOPY);  Surgeon: Elois Hair, MD;  Location: [facility] ENDOSCOPY;  Service: Gastroenterology;  Laterality: N/A;   EYE SURGERY     HEMORROIDECTOMY     RIGHT/LEFT HEART CATH AND CORONARY/GRAFT ANGIOGRAPHY N/A 10/07/2019   Procedure: RIGHT/LEFT HEART CATH AND CORONARY/GRAFT ANGIOGRAPHY;  Surgeon: Arnoldo Lapping, MD;  Location: [facility] INVASIVE CV LAB;  Service: Cardiovascular;  Laterality: N/A;   TEE WITHOUT CARDIOVERSION N/A 10/27/2019   Procedure: TRANSESOPHAGEAL ECHOCARDIOGRAM (TEE);  Surgeon: Odie Benne, MD;  Location[facility] INVASIVE CV LAB;  Service: Open Heart Surgery;  Laterality: N/A;   TRANSCATHETER AORTIC VALVE REPLACEMENT, TRANSFEMORAL N/A 10/27/2019   Procedure: TRANSCATHETER AORTIC VALVE REPLACEMENT, TRANSFEMORAL;  Surgeon: Odie Benne, MD;  Location: [facility] INVASIVE CV LAB;  Service: Open Heart Surgery;  Laterality: N/A;    Family History  Problem Relation Age of Onset   Cancer Mother         bone   Diabetes Mother    Kidney disease Mother    Stroke Mother    Sleep disorder Mother    Arthritis Father    Migraines Father    Hypertension Father    Sleep disorder Father    Arthritis Brother    Cancer Brother        throat   Throat cancer Daughter     Social History   Socioeconomic History   Marital status: Widowed    Spouse name: Juanita   Number of children: 2   Years of education: Not on file   Highest education level: 7th grade  Occupational History   Occupation: Retired    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  He has one son that lives with him and one daughter that lives beside of him. He is close with his family.  His sister cooks meals for him regularly.  He has friends at the cafe he visits regularly.   Social Drivers of Corporate investment banker Strain: Low Risk  (03/07/2023)   Overall Financial Resource Strain (CARDIA)    Difficulty of Paying Living Expenses: Not hard at all  Food Insecurity: No Food Insecurity (06/18/2023)   Hunger Vital Sign    Worried About Running Out of Food in the Last Year: Never true    Ran Out of Food in the Last Year: Never true  Transportation Needs: No Transportation Needs (06/18/2023)   PRAPARE - Administrator, Civil Service (Medical): No    Lack of Transportation (Non-Medical): No  Physical Activity: Insufficiently Active (03/07/2023)   Exercise Vital Sign    Days of Exercise per Week: 2 days    Minutes of Exercise per Session: 10 min  Stress: No Stress  Concern Present (03/07/2023)   [employer] of Occupational Health - Occupational Stress Questionnaire    Feeling of Stress : Not at all  Social Connections: Moderately Isolated (03/07/2023)   Social Connection and Isolation Panel [NHANES]    Frequency of Communication with Friends and Family: More than three times a week    Frequency of Social Gatherings with Friends and Family: More than three times a week    Attends Religious Services: 1 to 4 times per year    Active Member of [employer] or Organizations: No    Attends Banker Meetings: Never    Marital Status: Widowed  Intimate Partner Violence: Not At Risk (06/18/2023)   Humiliation, Afraid, Rape, and Kick questionnaire    Fear of Current or Ex-Partner: No    Emotionally Abused: No    Physically Abused: No    Sexually Abused: No    Outpatient Medications Prior to Visit  Medication Sig Dispense Refill   acetaminophen  (TYLENOL ) 650 MG CR tablet Take 650 mg by mouth every 8 (eight) hours as needed for pain.      albuterol  (VENTOLIN  HFA) 108 (90 Base) MCG/ACT inhaler Inhale 2 puffs into the lungs every 2 (two) hours as needed for wheezing or shortness of breath. 6.7 g 6   ammonium lactate  (LAC-HYDRIN ) 12 % lotion Apply 1 Application topically 2 (two) times daily. Apply twice daily to arms and hands to increase moisture and reduce easy bruising.  Can also be used safely on neck, chest, and legs as needed 400 g 0   ascorbic acid  (VITAMIN C) 500 MG tablet Take 1 tablet (500 mg total) by mouth daily. 30 tablet 0   aspirin  EC 81 MG tablet Take 1 tablet (81 mg total) by mouth daily. Swallow whole. 90 tablet 1   carbidopa -levodopa  (SINEMET  IR) 25-100 MG tablet TAKE ONE TABLET BY MOUTH AT BEDTIME AS NEEDED FOR restless legs 90 tablet 1   carvedilol  (COREG ) 25 MG tablet TAKE ONE TABLET BY MOUTH TWICE DAILY 60 tablet 1   cholecalciferol  (VITAMIN D3) 25 MCG (1000 UNIT) tablet Take 1 tablet (1,000 Units total) by mouth daily. 90 tablet 1    ezetimibe  (ZETIA ) 10 MG tablet Take 1 tablet (10 mg total) by mouth daily. 90  tablet 1   fluorometholone (FML) 0.1 % ophthalmic suspension Place 1 drop into both eyes daily.     furosemide  (LASIX ) 80 MG tablet Take 1 tablet (80 mg total) by mouth daily. 90 tablet 3   meclizine  (ANTIVERT ) 25 MG tablet Take 1 tablet (25 mg total) by mouth every 6 (six) hours. 120 tablet 3   neomycin-polymyxin b-dexamethasone  (MAXITROL) 3.5-10000-0.1 SUSP Place 1 drop into both eyes every 6 (six) hours.     nitroGLYCERIN  (NITROSTAT ) 0.4 MG SL tablet Place 1 tablet (0.4 mg total) under the tongue every 5 (five) minutes as needed for chest pain. 25 tablet 2   pantoprazole  (PROTONIX ) 40 MG tablet Take 1 tablet (40 mg total) by mouth daily. 100 tablet 1   potassium chloride  (KLOR-CON ) 10 MEQ tablet Take 1 tablet (10 mEq total) by mouth daily. 90 tablet 3   pregabalin  (LYRICA ) 75 MG capsule Take 1 capsule (75 mg total) by mouth 2 (two) times daily. 90 capsule 0   rosuvastatin  (CRESTOR ) 20 MG tablet Take 1 tablet (20 mg total) by mouth at bedtime. 90 tablet 3   sertraline  (ZOLOFT ) 50 MG tablet TAKE ONE TABLET BY MOUTH ONCE DAILY 90 tablet 1   Sodium Sulfate-Mag Sulfate-KCl (SUTAB ) 1479-225-188 MG TABS Use as directed for colonoscopy. MANUFACTURER CODES!! BIN: [id number] PCN: CN GROUP: [id number] MEMBER ID: [id number];ACZ AS SECONDARY INSURANCE ;NO PRIOR AUTHORIZATION 24 tablet 0   vitamin B-12 (CYANOCOBALAMIN ) 500 MCG tablet Take 500 mcg by mouth daily.     zinc  sulfate 220 (50 Zn) MG capsule Take 1 capsule (220 mg total) by mouth daily. 90 capsule 1   No facility-administered medications prior to visit.    Allergies  Allergen Reactions   Poison Oak Extract Rash    Review of Systems  Constitutional:  Negative for appetite change, fatigue and fever.  HENT:  Negative for congestion, ear pain, sinus pressure and sore throat.   Respiratory:  Negative for cough, chest tightness, shortness of breath and wheezing.    Cardiovascular:  Negative for chest pain and palpitations.  Gastrointestinal:  Negative for abdominal pain, constipation, diarrhea, nausea and vomiting.  Genitourinary:  Negative for dysuria and hematuria.  Musculoskeletal:  Negative for arthralgias, back pain, joint swelling and myalgias.  Skin:  Negative for rash.  Neurological:  Negative for dizziness, weakness and headaches.  Psychiatric/Behavioral:  Negative for dysphoric mood. The patient is not nervous/anxious.        Objective:         07/19/2023    9:47 AM 07/12/2023    2:44 PM 07/12/2023    1:53 PM  Vitals with BMI  Height 5\' 4"     Weight 147 lbs    BMI 25.22    Systolic 150 100 94  Diastolic 72 71 77  Pulse 71 80 84    No data found.   Physical Exam  There are no preventive care reminders to display for this patient.  There are no preventive care reminders to display for this patient.   No results found for: "TSH" Lab Results  Component Value Date   WBC 16.2 (H) 07/01/2023   HGB 9.3 (L) 07/01/2023   HCT 28.9 (L) 07/01/2023   MCV 86.3 07/01/2023   PLT 428 (H) 07/01/2023   Lab Results  Component Value Date   NA 141 07/01/2023   K 3.0 (L) 07/01/2023   CO2 22 07/01/2023   GLUCOSE 173 (H) 07/01/2023   BUN 31 (H) 07/01/2023   CREATININE 1.54 (H) 07/01/2023  BILITOT 0.9 07/01/2023   ALKPHOS 120 07/01/2023   AST 13 (L) 07/01/2023   ALT <5 07/01/2023   PROT 5.8 (L) 07/01/2023   ALBUMIN 3.6 07/01/2023   CALCIUM  9.0 07/01/2023   ANIONGAP 17 (H) 07/01/2023   EGFR 48.0 06/12/2023   Lab Results  Component Value Date   CHOL 106 05/08/2023   Lab Results  Component Value Date   HDL 27 (L) 05/08/2023   Lab Results  Component Value Date   LDLCALC 58 05/08/2023   Lab Results  Component Value Date   TRIG 112 05/08/2023   Lab Results  Component Value Date   CHOLHDL 3.9 05/08/2023   Lab Results  Component Value Date   HGBA1C 5.3 05/08/2023       Assessment & Plan:  There are no diagnoses  linked to this encounter.   No orders of the defined types were placed in this encounter.   No orders of the defined types were placed in this encounter.    Follow-up: No follow-ups on file.  An After Visit Summary was printed and given to the patient.   I,Lauren M Auman,acting as a Neurosurgeon for [employer], PA.,have documented all relevant documentation on the behalf of [name], PA,as directed by  [name], PA while in the presence of [name], [place].    [name], [place] [facility] [phone]

## 2023-08-28 ENCOUNTER — Ambulatory Visit (INDEPENDENT_AMBULATORY_CARE_PROVIDER_SITE_OTHER): Admitting: Physician Assistant

## 2023-08-28 ENCOUNTER — Encounter: Payer: Self-pay | Admitting: Physician Assistant

## 2023-08-28 VITALS — BP 88/50 | HR 75 | Temp 98.0°F | Ht 64.0 in | Wt 141.0 lb

## 2023-08-28 DIAGNOSIS — J811 Chronic pulmonary edema: Secondary | ICD-10-CM | POA: Diagnosis not present

## 2023-08-28 DIAGNOSIS — G20A1 Parkinson's disease without dyskinesia, without mention of fluctuations: Secondary | ICD-10-CM | POA: Diagnosis not present

## 2023-08-28 DIAGNOSIS — I340 Nonrheumatic mitral (valve) insufficiency: Secondary | ICD-10-CM | POA: Diagnosis not present

## 2023-08-28 DIAGNOSIS — I110 Hypertensive heart disease with heart failure: Secondary | ICD-10-CM | POA: Diagnosis not present

## 2023-08-28 DIAGNOSIS — I252 Old myocardial infarction: Secondary | ICD-10-CM | POA: Diagnosis not present

## 2023-08-28 DIAGNOSIS — Z7901 Long term (current) use of anticoagulants: Secondary | ICD-10-CM | POA: Diagnosis not present

## 2023-08-28 DIAGNOSIS — Z789 Other specified health status: Secondary | ICD-10-CM | POA: Diagnosis not present

## 2023-08-28 DIAGNOSIS — M25551 Pain in right hip: Secondary | ICD-10-CM | POA: Diagnosis not present

## 2023-08-28 DIAGNOSIS — I10 Essential (primary) hypertension: Secondary | ICD-10-CM | POA: Diagnosis not present

## 2023-08-28 DIAGNOSIS — M25552 Pain in left hip: Secondary | ICD-10-CM | POA: Diagnosis not present

## 2023-08-28 DIAGNOSIS — R9431 Abnormal electrocardiogram [ECG] [EKG]: Secondary | ICD-10-CM | POA: Diagnosis not present

## 2023-08-28 DIAGNOSIS — G629 Polyneuropathy, unspecified: Secondary | ICD-10-CM | POA: Diagnosis not present

## 2023-08-28 DIAGNOSIS — R54 Age-related physical debility: Secondary | ICD-10-CM | POA: Diagnosis not present

## 2023-08-28 DIAGNOSIS — D649 Anemia, unspecified: Secondary | ICD-10-CM | POA: Diagnosis not present

## 2023-08-28 DIAGNOSIS — M21372 Foot drop, left foot: Secondary | ICD-10-CM | POA: Diagnosis not present

## 2023-08-28 DIAGNOSIS — I959 Hypotension, unspecified: Secondary | ICD-10-CM | POA: Diagnosis not present

## 2023-08-28 DIAGNOSIS — E785 Hyperlipidemia, unspecified: Secondary | ICD-10-CM | POA: Diagnosis not present

## 2023-08-28 DIAGNOSIS — Z8673 Personal history of transient ischemic attack (TIA), and cerebral infarction without residual deficits: Secondary | ICD-10-CM | POA: Diagnosis not present

## 2023-08-28 DIAGNOSIS — Z952 Presence of prosthetic heart valve: Secondary | ICD-10-CM | POA: Diagnosis not present

## 2023-08-28 DIAGNOSIS — R296 Repeated falls: Secondary | ICD-10-CM

## 2023-08-28 DIAGNOSIS — A419 Sepsis, unspecified organism: Secondary | ICD-10-CM | POA: Diagnosis not present

## 2023-08-28 DIAGNOSIS — E7800 Pure hypercholesterolemia, unspecified: Secondary | ICD-10-CM | POA: Diagnosis not present

## 2023-08-28 DIAGNOSIS — Z7982 Long term (current) use of aspirin: Secondary | ICD-10-CM | POA: Diagnosis not present

## 2023-08-28 DIAGNOSIS — I5043 Acute on chronic combined systolic (congestive) and diastolic (congestive) heart failure: Secondary | ICD-10-CM | POA: Diagnosis not present

## 2023-08-28 DIAGNOSIS — I2510 Atherosclerotic heart disease of native coronary artery without angina pectoris: Secondary | ICD-10-CM | POA: Diagnosis not present

## 2023-08-28 DIAGNOSIS — I371 Nonrheumatic pulmonary valve insufficiency: Secondary | ICD-10-CM | POA: Diagnosis not present

## 2023-08-28 DIAGNOSIS — M21371 Foot drop, right foot: Secondary | ICD-10-CM | POA: Diagnosis not present

## 2023-08-28 DIAGNOSIS — I4891 Unspecified atrial fibrillation: Secondary | ICD-10-CM | POA: Diagnosis not present

## 2023-08-28 DIAGNOSIS — Z951 Presence of aortocoronary bypass graft: Secondary | ICD-10-CM | POA: Diagnosis not present

## 2023-08-28 DIAGNOSIS — Z66 Do not resuscitate: Secondary | ICD-10-CM | POA: Diagnosis not present

## 2023-08-28 DIAGNOSIS — D72829 Elevated white blood cell count, unspecified: Secondary | ICD-10-CM | POA: Diagnosis not present

## 2023-08-28 DIAGNOSIS — Z79899 Other long term (current) drug therapy: Secondary | ICD-10-CM | POA: Diagnosis not present

## 2023-08-28 DIAGNOSIS — I361 Nonrheumatic tricuspid (valve) insufficiency: Secondary | ICD-10-CM | POA: Diagnosis not present

## 2023-08-28 DIAGNOSIS — K219 Gastro-esophageal reflux disease without esophagitis: Secondary | ICD-10-CM | POA: Diagnosis not present

## 2023-08-28 DIAGNOSIS — M069 Rheumatoid arthritis, unspecified: Secondary | ICD-10-CM | POA: Diagnosis not present

## 2023-08-28 DIAGNOSIS — I4510 Unspecified right bundle-branch block: Secondary | ICD-10-CM | POA: Diagnosis not present

## 2023-08-29 ENCOUNTER — Ambulatory Visit: Payer: Self-pay

## 2023-08-29 DIAGNOSIS — I4891 Unspecified atrial fibrillation: Secondary | ICD-10-CM | POA: Diagnosis not present

## 2023-08-29 DIAGNOSIS — I340 Nonrheumatic mitral (valve) insufficiency: Secondary | ICD-10-CM | POA: Diagnosis not present

## 2023-08-29 DIAGNOSIS — Z952 Presence of prosthetic heart valve: Secondary | ICD-10-CM | POA: Diagnosis not present

## 2023-08-29 DIAGNOSIS — I361 Nonrheumatic tricuspid (valve) insufficiency: Secondary | ICD-10-CM | POA: Diagnosis not present

## 2023-08-29 DIAGNOSIS — I10 Essential (primary) hypertension: Secondary | ICD-10-CM

## 2023-08-29 DIAGNOSIS — E785 Hyperlipidemia, unspecified: Secondary | ICD-10-CM

## 2023-08-29 DIAGNOSIS — Z789 Other specified health status: Secondary | ICD-10-CM | POA: Diagnosis not present

## 2023-08-29 DIAGNOSIS — I371 Nonrheumatic pulmonary valve insufficiency: Secondary | ICD-10-CM | POA: Diagnosis not present

## 2023-08-29 DIAGNOSIS — I2510 Atherosclerotic heart disease of native coronary artery without angina pectoris: Secondary | ICD-10-CM | POA: Diagnosis not present

## 2023-08-29 NOTE — Assessment & Plan Note (Signed)
 Frequent recent falls with unknown etiology Unable to get accurate readings in office for orthostatics Oxygen  saturation dropping to dangerous levels Will send patient via ambulance to ER for further work up

## 2023-08-29 NOTE — Assessment & Plan Note (Signed)
 Uncontrolled BP readings difficult to obtain in office Unable to obtain orthostatic BP Will send patient via ambulance to the ED for further work up

## 2023-08-29 NOTE — Telephone Encounter (Signed)
 Called patient's son back and he updated us  on the status of the patient and was wanting to know about his oxygen  and if that was something that we were still going to order for the patient. Per [name] we are going to see the patient when he comes in for hospital follow up and will determine at that time if we still need to order the oxygen  or not. Patient's son understood verbally.

## 2023-08-29 NOTE — Telephone Encounter (Signed)
  FYI Only or Action Required?: Action required by provider  Patient was last seen in primary care on 08/28/2023 by [name], PA. Called Nurse Triage reporting Advice Only. Symptoms began yesterday. Interventions attempted: Other: admitted to the ICU, Xrays taken, scheduled for CT scan, update on medication question. Symptoms are: low BP gradually improving.  Triage Disposition: Call PCP Now  Patient/caregiver understands and will follow disposition?: Yes              Copied from CRM [id number]. Topic: Clinical - Medical Advice >> Aug 29, 2023 10:43 AM [name] wrote: Pt son called to notify team that Pt has been admitted to the ICU and the and has some medication questions please call he Jack Hodges @ [phone] Reason for Disposition  Call about patient who is currently hospitalized  Answer Assessment - Initial Assessment Questions 1. REASON FOR CALL or QUESTION: "What is your reason for calling today?" or "How can I best help you?" or "What question do you have that I can help answer?"     Jack Hodges is calling in to inform PCP that patient was admitted to the ICU yesterday. X Rays were taken of lower back and nothing is broken. He also states that patient's BP is gradually improving. He would like to follow up with the clinic to make sure the home oxygen , that was mentioned, has been ordered and if he can receive a follow up call. He states [place], [place], asked about olmesartan  and he states he has refilled and picked it up. He also states he scheduled the patient for CT scan next week.  2. CALLER: Document the source of call. (e.g., laboratory, patient).     Son, Jack Hodges.  Protocols used: PCP Call - No Triage-A-AH

## 2023-08-30 DIAGNOSIS — I10 Essential (primary) hypertension: Secondary | ICD-10-CM | POA: Diagnosis not present

## 2023-08-30 DIAGNOSIS — I4891 Unspecified atrial fibrillation: Secondary | ICD-10-CM | POA: Diagnosis not present

## 2023-08-30 DIAGNOSIS — I2510 Atherosclerotic heart disease of native coronary artery without angina pectoris: Secondary | ICD-10-CM | POA: Diagnosis not present

## 2023-09-02 ENCOUNTER — Telehealth: Payer: Self-pay

## 2023-09-02 NOTE — Progress Notes (Signed)
 Spoke to patient's son and reminded him that Mr.Jack Hodges is due for labs this week with Dr Cherryl Corona. He states he will bring patient for these labs.

## 2023-09-02 NOTE — Telephone Encounter (Signed)
 [facility] Supplemental Orders from 08/25/2023 to 08/26/2023

## 2023-09-02 NOTE — Telephone Encounter (Signed)
 I called [name] and let her know that [name], PA-C is agreed with the plan.  Also, Carvedilol  was changed to 12.5 mg twice a day and furosemide  20 mg twice a day.  Hospital stopped carbidopa , meclizine , furosemide  80 mg and carvedilol  25 mg.  I clarified the directions with [name].   Copied from CRM [id number]. Topic: Clinical - Home Health Verbal Orders >> Sep 02, 2023  3:33 PM Opal Bill wrote: Caller/Agency: [facility] Callback Number: [phone] Service Requested: Skilled Nursing, Home health aide Frequency: For nursing 2w1 and 1w2.  For aide, 1w1 and 2w2 Any new concerns about the patient? Yes about the patient's medication carvedilol  (COREG ) 25 MG tablet. There was a discrepancy with the directions and dosing. Please follow up to clarify.

## 2023-09-03 ENCOUNTER — Inpatient Hospital Stay: Admitting: Physician Assistant

## 2023-09-03 NOTE — Telephone Encounter (Signed)
 Spoke with Galva with [facility], okayed the below.   Copied from CRM [id number]. Topic: Clinical - Home Health Verbal Orders >> Sep 03, 2023  3:20 PM Elle L wrote: Caller/Agency: Jonothan Neve Physical Therapist with [facility] Callback Number: [phone] Service Requested: Physical Therapy Frequency: Re-evaluation verbal order and 2 week 3 for strengthening, balance, exercise and safety  Any new concerns about the patient? No, but the patient is weaker since leaving the hospital.

## 2023-09-04 ENCOUNTER — Ambulatory Visit (INDEPENDENT_AMBULATORY_CARE_PROVIDER_SITE_OTHER): Admitting: Physician Assistant

## 2023-09-04 ENCOUNTER — Encounter: Payer: Self-pay | Admitting: Physician Assistant

## 2023-09-04 VITALS — BP 130/62 | HR 82 | Temp 97.8°F | Ht 64.0 in | Wt 145.0 lb

## 2023-09-04 DIAGNOSIS — R634 Abnormal weight loss: Secondary | ICD-10-CM | POA: Diagnosis not present

## 2023-09-04 DIAGNOSIS — I4819 Other persistent atrial fibrillation: Secondary | ICD-10-CM | POA: Diagnosis not present

## 2023-09-04 DIAGNOSIS — R7989 Other specified abnormal findings of blood chemistry: Secondary | ICD-10-CM | POA: Diagnosis not present

## 2023-09-04 DIAGNOSIS — G252 Other specified forms of tremor: Secondary | ICD-10-CM

## 2023-09-04 DIAGNOSIS — F03A0 Unspecified dementia, mild, without behavioral disturbance, psychotic disturbance, mood disturbance, and anxiety: Secondary | ICD-10-CM

## 2023-09-04 DIAGNOSIS — I110 Hypertensive heart disease with heart failure: Secondary | ICD-10-CM | POA: Diagnosis not present

## 2023-09-04 DIAGNOSIS — I5043 Acute on chronic combined systolic (congestive) and diastolic (congestive) heart failure: Secondary | ICD-10-CM

## 2023-09-04 DIAGNOSIS — R531 Weakness: Secondary | ICD-10-CM | POA: Diagnosis not present

## 2023-09-04 NOTE — Assessment & Plan Note (Signed)
 Significant tremors, previously on Sinemet , discontinued due to side effects. Neurology follow-up to reassess treatment. - Follow up with neurology on September 10, 2023, to reassess treatment for Parkinson's disease and tremors.

## 2023-09-04 NOTE — Assessment & Plan Note (Signed)
 Chronic atrial fibrillation causing left atrial enlargement and reduced ejection fraction. On anticoagulation to reduce thromboembolic risk. - Continue anticoagulation therapy. - Advise caution to prevent falls. - Follow up with cardiology on October 16, 2023.  CHF with ejection fraction 45-50%, left atrial enlargement, valve regurgitation, exacerbated by atrial fibrillation. Fatigue possibly due to increased cardiac workload and elevated pulmonary artery pressure. - Monitor cardiac status and symptoms. - Follow up with cardiology on October 16, 2023.

## 2023-09-04 NOTE — Assessment & Plan Note (Signed)
 Weakness and fatigue likely multifactorial, related to cardiac issues, leukocytosis, and nutrition. - Continue physical and occupational therapy at home. - Encourage nutritional intake with Ensure supplements between meals.

## 2023-09-04 NOTE — Assessment & Plan Note (Signed)
 Rate controlled Continue to follow up with Dr. Krasowski Appointment on 7/23

## 2023-09-04 NOTE — Progress Notes (Signed)
 Subjective:  Patient ID: Jack Hodges, male    DOB: 03-Jul-1930  Age: 89 y.o. MRN: [id number]  Chief Complaint  Patient presents with   Hospital follow up    HPI:   Hospital follow up from [facility]. Admitted 08/28/23 and discharged 08/30/23.   Patient was started on Eliquis  5 mg 1 tablet BID, and Lasix  20 mg once daily.  Discussed the use of AI scribe software for clinical note transcription with the patient, who gave verbal consent to proceed.  History of Present Illness   Jack Hodges is a 89 year old male with Parkinson's disease and atrial fibrillation who presents with generalized weakness and fatigue.  He has been experiencing generalized weakness and fatigue, feeling generally unwell with significant tiredness. He has a history of Parkinson's disease, previously managed with Sinemet , which was discontinued during a recent hospital stay. Since then, he has experienced increased shaking and significant numbness in his hands.  He also has a history of atrial fibrillation and recent echocardiogram findings showed an ejection fraction of 45-50%, with enlargement of the left atrium and regurgitation in the mitral and tricuspid valves. He is currently on a blood thinner.  He was hospitalized three days ago due to elevated white blood cell count and underwent various tests. He is scheduled for a CT scan tomorrow. He recalls having blood in his stool in February, which led to a referral to a hematologist, but he has not experienced further episodes since then. During his recent hospital stay, he received IV iron, which temporarily improved his condition, but he now struggles with eating. He is consuming Ensure to supplement his diet and is trying to maintain his weight, noting that his clothes are too big and he feels he is losing weight.  He is receiving physical and occupational therapy at home, which was restarted today. He is attempting to stay active by walking around his  house, although he notes significant limitations compared to his past activity levels. He expresses frustration with his current physical limitations, as he used to be very active. He lives alone following the passing of his wife eight years ago and primarily consumes microwave meals and Ensure for nutrition.           06/18/2023    2:29 PM 03/07/2023    1:07 PM 12/26/2022   11:30 AM 01/24/2022    8:26 PM 10/11/2021    8:43 AM  Depression screen PHQ 2/9  Decreased Interest 0 0 0 2 2  Down, Depressed, Hopeless 0 0 0 2 2  PHQ - 2 Score 0 0 0 4 4  Altered sleeping  0  2 3  Tired, decreased energy  0  1 2  Change in appetite  3  2 2   Feeling bad or failure about yourself   0  1 3  Trouble concentrating  0  0 0  Moving slowly or fidgety/restless  0  0 0  Suicidal thoughts  0  0 0  PHQ-9 Score  3  10 14   Difficult doing work/chores  Not difficult at all  Not difficult at all Somewhat difficult        03/07/2023    1:07 PM  Fall Risk   Falls in the past year? 0  Number falls in past yr: 0  Injury with Fall? 0  Risk for fall due to : No Fall Risks  Follow up Falls prevention discussed    Patient Care Team: [name], [place]  as PCP - General (Physician Assistant) Manfred Seed, MD as PCP - Cardiology (Cardiology)   Review of Systems  Constitutional:  Positive for fatigue. Negative for appetite change and fever.  HENT:  Negative for congestion, ear pain, sinus pressure and sore throat.   Respiratory:  Negative for cough, chest tightness, shortness of breath and wheezing.   Cardiovascular:  Negative for chest pain and palpitations.  Gastrointestinal:  Negative for abdominal pain, constipation, diarrhea, nausea and vomiting.  Genitourinary:  Negative for dysuria and hematuria.  Musculoskeletal:  Positive for gait problem. Negative for arthralgias, back pain, joint swelling and myalgias.  Skin:  Negative for rash.  Neurological:  Positive for dizziness. Negative for weakness and  headaches.  Psychiatric/Behavioral:  Negative for dysphoric mood. The patient is not nervous/anxious.     Current Outpatient Medications on File Prior to Visit  Medication Sig Dispense Refill   acetaminophen  (TYLENOL ) 650 MG CR tablet Take 650 mg by mouth every 8 (eight) hours as needed for pain.      albuterol  (VENTOLIN  HFA) 108 (90 Base) MCG/ACT inhaler Inhale 2 puffs into the lungs every 2 (two) hours as needed for wheezing or shortness of breath. 6.7 g 6   ammonium lactate  (LAC-HYDRIN ) 12 % lotion Apply 1 Application topically 2 (two) times daily. Apply twice daily to arms and hands to increase moisture and reduce easy bruising.  Can also be used safely on neck, chest, and legs as needed 400 g 0   ascorbic acid  (VITAMIN C) 500 MG tablet Take 1 tablet (500 mg total) by mouth daily. 30 tablet 0   aspirin  EC 81 MG tablet Take 1 tablet (81 mg total) by mouth daily. Swallow whole. 90 tablet 1   carvedilol  (COREG ) 12.5 MG tablet Take 12.5 mg by mouth 2 (two) times daily.     cholecalciferol  (VITAMIN D3) 25 MCG (1000 UNIT) tablet Take 1 tablet (1,000 Units total) by mouth daily. 90 tablet 1   ELIQUIS  5 MG TABS tablet Take 5 mg by mouth 2 (two) times daily.     ezetimibe  (ZETIA ) 10 MG tablet Take 1 tablet (10 mg total) by mouth daily. 90 tablet 1   fluorometholone (FML) 0.1 % ophthalmic suspension Place 1 drop into both eyes daily.     furosemide  (LASIX ) 20 MG tablet Take 20 mg by mouth 2 (two) times daily.     neomycin-polymyxin b-dexamethasone  (MAXITROL) 3.5-10000-0.1 SUSP Place 1 drop into both eyes every 6 (six) hours.     pantoprazole  (PROTONIX ) 40 MG tablet Take 1 tablet (40 mg total) by mouth daily. 100 tablet 1   pregabalin  (LYRICA ) 75 MG capsule Take 1 capsule (75 mg total) by mouth 2 (two) times daily. 90 capsule 0   rosuvastatin  (CRESTOR ) 20 MG tablet Take 1 tablet (20 mg total) by mouth at bedtime. 90 tablet 3   sertraline  (ZOLOFT ) 50 MG tablet TAKE ONE TABLET BY MOUTH ONCE DAILY 90  tablet 1   Sodium Sulfate-Mag Sulfate-KCl (SUTAB ) 1479-225-188 MG TABS Use as directed for colonoscopy. MANUFACTURER CODES!! BIN: [id number] PCN: CN GROUP: [id number] MEMBER ID: [id number];NGE AS SECONDARY INSURANCE ;NO PRIOR AUTHORIZATION 24 tablet 0   vitamin B-12 (CYANOCOBALAMIN ) 500 MCG tablet Take 500 mcg by mouth daily.     zinc  sulfate 220 (50 Zn) MG capsule Take 1 capsule (220 mg total) by mouth daily. 90 capsule 1   nitroGLYCERIN  (NITROSTAT ) 0.4 MG SL tablet Place 1 tablet (0.4 mg total) under the tongue every 5 (five) minutes as needed for  chest pain. 25 tablet 2   potassium chloride  (KLOR-CON ) 10 MEQ tablet Take 1 tablet (10 mEq total) by mouth daily. 90 tablet 3   [DISCONTINUED] QUEtiapine  (SEROQUEL ) 25 MG tablet Take 1 tablet (25 mg total) by mouth at bedtime. 15 tablet 10   No current facility-administered medications on file prior to visit.   Past Medical History:  Diagnosis Date   Acute bursitis of left shoulder 01/04/2020   Acute on chronic diastolic congestive heart failure (HCC) 11/13/2021   Last Assessment & Plan:  Formatting of this note is different from the original. Pertinent Data:   Current medication includes: furosemide  - 20 mg lisinopriL  - 10 mg metoprolol  succinate - 25 mg.  BNP (pg/mL)  Date Value  11/15/2021 433.6 (H)   eGFR (mL/min/1.67m*2)  Date Value  11/15/2021 >60.0    BP Readings from Last 3 Encounters:  11/30/21 (!) 147/82  11/15/21 (!) 109/54  11/13/21 (!) 134/74     Acute respiratory failure with hypoxia (HCC) 03/17/2020   Angina pectoris (HCC) 10/07/2019   Arthritis    Atherosclerosis of aorta (HCC) 09/24/2019   Bilateral foot-drop 01/18/2017   BMI 27.0-27.9,adult 09/24/2019   Coronary artery disease involving native coronary artery of native heart without angina pectoris 11/01/2015   Overview:  Bypass surgery in 1996 Overview:  Overview:  Bypass surgery in 1996  Last Assessment & Plan:  Continue his current regimen.  Follow up as noted.   CVA (cerebral  vascular accident) (HCC) 04/03/2016   Last Assessment & Plan:  The patient underwent extensive CVA workup.  MRI is negative.  He does have aortic stenosis by echocardiogram.  Upon review of Care everywhere he is followed by Dr. Gordan Latina, cardiologist for this.  Carotid ultrasound negative for hemodynamically significant stenosis.  Lipid panel within normal limits.  He has not had any further episodes of dizziness or nausea vomiting.    Demand ischemia (HCC) 11/14/2021   Last Assessment & Plan:  Formatting of this note might be different from the original. Troponin flat No anginal symptoms Formatting of this note might be different from the original. Last Assessment & Plan:  Formatting of this note might be different from the original. Troponin flat No anginal symptoms   Dementia (HCC)    Depression    DNR (do not resuscitate) 04/03/2016   Overview:  I had an extensive discussion with the patient on 04/03/2016 regarding his end of life wishes.  He and his daughter agree that do not resuscitate is in keeping with his beliefs.   Dyslipidemia 11/01/2015   Last Assessment & Plan:  Formatting of this note might be different from the original. Cholesterol 136   Triglycerides  83  HDL  50.1  LDL Calculated  69  VLDL Cholesterol Cal  17   Continue statin.   Dyspnea    with activity   Essential hypertension 11/01/2015   Last Assessment & Plan:  Resume home medications.  Follow-up as noted above.   Generalized weakness 03/13/2020   GERD (gastroesophageal reflux disease)    Herpes zoster 05/19/2019   History of aortic stenosis 03/11/2020   History of CVA (cerebrovascular accident) 11/14/2021   Last Assessment & Plan:  Formatting of this note might be different from the original. Neurologically intact upon assessment -continue aspirin , statin   Hypertensive heart disease with heart failure (HCC) 11/01/2015   Last Assessment & Plan:  Resume home medications.  Follow-up as noted above.   Myocardial  infarction (HCC)    approx 1994  Neuropathy    Peripheral neuropathy 11/14/2021   Last Assessment & Plan:  Formatting of this note might be different from the original. Bilateral lower extremity cool to touch with diminished sensation Patient reports findings are chronic Continue home dose Lyrica  Formatting of this note might be different from the original. Last Assessment & Plan:  Formatting of this note might be different from the original. 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   Thrombocytopenia (HCC) 03/13/2020   Trochanteric bursitis of left hip 01/19/2021   Past Surgical History:  Procedure Laterality Date   CARDIAC CATHETERIZATION     CARDIAC SURGERY     CATARACT EXTRACTION, BILATERAL     COLONOSCOPY     [facility]   COLONOSCOPY N/A 07/04/2023   Procedure: COLONOSCOPY;  Surgeon: Elois Hair, MD;  Location: [facility] ENDOSCOPY;  Service: Gastroenterology;  Laterality: N/A;   CORONARY ARTERY BYPASS GRAFT     CORONARY STENT INTERVENTION N/A 10/07/2019   Procedure: CORONARY STENT INTERVENTION;  Surgeon: Arnoldo Lapping, MD;  Location: [facility] INVASIVE CV LAB;  Service: Cardiovascular;  Laterality: N/A;   ESOPHAGOGASTRODUODENOSCOPY N/A 07/04/2023   Procedure: EGD (ESOPHAGOGASTRODUODENOSCOPY);  Surgeon: Elois Hair, MD;  Location: [facility] ENDOSCOPY;  Service: Gastroenterology;  Laterality: N/A;   EYE SURGERY     HEMORROIDECTOMY     RIGHT/LEFT HEART CATH AND CORONARY/GRAFT ANGIOGRAPHY N/A 10/07/2019   Procedure: RIGHT/LEFT HEART  CATH AND CORONARY/GRAFT ANGIOGRAPHY;  Surgeon: Arnoldo Lapping, MD;  Location: [facility] INVASIVE CV LAB;  Service: Cardiovascular;  Laterality: N/A;   TEE WITHOUT CARDIOVERSION N/A 10/27/2019   Procedure: TRANSESOPHAGEAL ECHOCARDIOGRAM (TEE);  Surgeon: Odie Benne, MD;  Location: [facility] INVASIVE CV LAB;  Service: Open Heart Surgery;  Laterality: N/A;   TRANSCATHETER AORTIC VALVE REPLACEMENT, TRANSFEMORAL N/A 10/27/2019   Procedure: TRANSCATHETER AORTIC VALVE REPLACEMENT, TRANSFEMORAL;  Surgeon: Odie Benne, MD;  Location: [facility] INVASIVE CV LAB;  Service: Open Heart Surgery;  Laterality: N/A;    Family History  Problem Relation Age of Onset   Cancer Mother        bone   Diabetes Mother    Kidney disease Mother    Stroke Mother    Sleep disorder Mother    Arthritis Father    Migraines Father    Hypertension Father    Sleep disorder Father    Arthritis Brother    Cancer Brother        throat   Throat cancer Daughter    Social History   Socioeconomic History   Marital status: Widowed    Spouse name: Juanita   Number of children: 2   Years of education: Not on file   Highest education level: 7th grade  Occupational History   Occupation: Retired    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  He has one son that lives with him and one daughter that lives beside of him. He is close with his family.  His sister cooks meals for him regularly.  He has friends at the cafe he visits regularly.   Social Drivers of Research scientist (physical sciences) Strain: Low Risk  (03/07/2023)   Overall Financial Resource Strain (CARDIA)    Difficulty of Paying Living Expenses: Not hard at all  Food Insecurity: No Food Insecurity (06/18/2023)   Hunger Vital Sign    Worried About Running Out of Food in the Last Year: Never true    Ran Out of Food in the Last Year: Never true  Transportation Needs: No Transportation Needs (06/18/2023)   PRAPARE - Administrator, Civil Service (Medical): No    Lack of Transportation (Non-Medical): No  Physical Activity: Insufficiently Active (03/07/2023)   Exercise Vital Sign    Days of Exercise per Week: 2 days    Minutes of Exercise per Session: 10 min  Stress: No Stress Concern Present (03/07/2023)   [employer] of Occupational Health - Occupational Stress Questionnaire    Feeling of Stress : Not at all  Social Connections: Moderately Isolated (03/07/2023)   Social Connection and Isolation Panel [NHANES]    Frequency of Communication with Friends and Family: More than three times a week    Frequency of Social Gatherings with Friends and Family: More than three times a week    Attends Religious Services: 1 to 4 times per year    Active Member of [employer] or Organizations: No    Attends Banker Meetings: Never    Marital Status: Widowed    Objective:  BP 130/62 (BP Location: Right Arm, Patient Position: Sitting)   Pulse 82   Temp 97.8 F (36.6 C) (Temporal)   Ht 5' 4 (1.626 m)   Wt 145 lb (65.8 kg)   SpO2 91%   BMI 24.89 kg/m      09/04/2023    2:18 PM 08/28/2023   11:49 AM 08/28/2023   10:52 AM  BP/Weight  Systolic BP 130 88 112  Diastolic BP 62 50 68  Wt. (Lbs) 145  141  BMI 24.89 kg/m2  24.2 kg/m2    Physical Exam Vitals reviewed.  Constitutional:      Appearance: He is cachectic.  Cardiovascular:     Rate and Rhythm: Normal rate. Rhythm irregular.     Heart sounds: Murmur heard.  Pulmonary:     Effort: Pulmonary effort is normal.     Breath sounds:  Normal breath sounds.  Abdominal:     General: Bowel sounds are normal.     Palpations: Abdomen is soft.     Tenderness: There is no abdominal tenderness.  Neurological:     Mental Status: He is alert and oriented to person, place, and time.     Gait: Gait abnormal.  Psychiatric:        Mood and Affect: Mood normal.        Behavior: Behavior normal. Behavior is cooperative.     Diabetic Foot Exam - Simple   No data filed      Lab Results  Component Value Date   WBC 16.2 (  H) 07/01/2023   HGB 9.3 (L) 07/01/2023   HCT 28.9 (L) 07/01/2023   PLT 428 (H) 07/01/2023   GLUCOSE 173 (H) 07/01/2023   CHOL 106 05/08/2023   TRIG 112 05/08/2023   HDL 27 (L) 05/08/2023   LDLCALC 58 05/08/2023   ALT <5 07/01/2023   AST 13 (L) 07/01/2023   NA 141 07/01/2023   K 3.0 (L) 07/01/2023   CL 103 07/01/2023   CREATININE 1.54 (H) 07/01/2023   BUN 31 (H) 07/01/2023   CO2 22 07/01/2023   INR 1.4 (H) 03/29/2023   HGBA1C 5.3 05/08/2023      Assessment & Plan:  Acute on chronic combined systolic and diastolic congestive heart failure (HCC) Assessment & Plan: Chronic atrial fibrillation causing left atrial enlargement and reduced ejection fraction. On anticoagulation to reduce thromboembolic risk. - Continue anticoagulation therapy. - Advise caution to prevent falls. - Follow up with cardiology on October 16, 2023.  CHF with ejection fraction 45-50%, left atrial enlargement, valve regurgitation, exacerbated by atrial fibrillation. Fatigue possibly due to increased cardiac workload and elevated pulmonary artery pressure. - Monitor cardiac status and symptoms. - Follow up with cardiology on October 16, 2023.  Orders: -     CBC with Differential/Platelet -     Comprehensive metabolic panel with GFR  Persistent atrial fibrillation (HCC) Assessment & Plan: Rate controlled Continue to follow up with Dr. Krasowski Appointment on 7/23  Orders: -     CBC with Differential/Platelet -      Comprehensive metabolic panel with GFR  Resting tremor Assessment & Plan: Significant tremors, previously on Sinemet , discontinued due to side effects. Neurology follow-up to reassess treatment. - Follow up with neurology on September 10, 2023, to reassess treatment for Parkinson's disease and tremors.   Mild dementia, unspecified dementia type, unspecified whether behavioral, psychotic, or mood disturbance or anxiety (HCC) Assessment & Plan: Significant tremors, previously on Sinemet , discontinued due to side effects. Neurology follow-up to reassess treatment. - Follow up with neurology on September 10, 2023, to reassess treatment for Parkinson's disease and tremors.   Generalized weakness Assessment & Plan: Weakness and fatigue likely multifactorial, related to cardiac issues, leukocytosis, and nutrition. - Continue physical and occupational therapy at home. - Encourage nutritional intake with Ensure supplements between meals.   Abnormal CBC Assessment & Plan: Leukocytosis with peak of 16,000/L, etiology unclear, requires monitoring. - Order blood tests to monitor white blood cell count.   Weight loss, unintentional Assessment & Plan: Difficulty maintaining weight, advised increased caloric intake. Stable weight during hospital monitoring. - Encourage increased caloric intake with Ensure supplements. - Monitor weight and nutritional status.       No orders of the defined types were placed in this encounter.   Orders Placed This Encounter  Procedures   CBC with Differential/Platelet   Comprehensive metabolic panel with GFR     Follow-up: Return in about 2 months (around 11/04/2023) for Chronic, Mirian Ames.   I,Lauren M Auman,acting as a Neurosurgeon for [employer], [employer].,have documented all relevant documentation on the behalf of [name], PA,as directed by  [name], PA while in the presence of [name], [place].   An After Visit Summary was printed and given to the  patient.  [name], [place] [facility] [phone]

## 2023-09-04 NOTE — Patient Instructions (Signed)
 VISIT SUMMARY:  You came in today because you have been feeling generally weak and fatigued. We discussed your history of Parkinson's disease and atrial fibrillation, and reviewed your recent hospital stay where you received IV iron and had various tests done. You are currently taking Ensure to help with your nutrition and are receiving physical and occupational therapy at home.  YOUR PLAN:  -GENERALIZED WEAKNESS: Your weakness and fatigue are likely due to a combination of your heart condition, high white blood cell count, and nutritional issues. Continue with your physical and occupational therapy at home and make sure to drink Ensure supplements between meals to help with your nutrition.  -CONGESTIVE HEART FAILURE: Congestive heart failure means your heart is not pumping blood as well as it should, which can cause fatigue and other symptoms. We will monitor your heart condition and symptoms closely. Please follow up with cardiology on October 16, 2023.  -ATRIAL FIBRILLATION: Atrial fibrillation is an irregular and often rapid heart rate that can lead to poor blood flow. You are on blood thinners to reduce the risk of blood clots. Be cautious to prevent falls and follow up with cardiology on October 16, 2023.  -PARKINSON'S DISEASE: Parkinson's disease is a disorder of the nervous system that affects movement. You have significant tremors and were previously on Sinemet , which was stopped due to side effects. Please follow up with neurology on September 10, 2023, to reassess your treatment.  -LEUKOCYTOSIS: Leukocytosis means you have a high white blood cell count, which can indicate an infection or other health issues. We will order blood tests to monitor your white blood cell count.  -NUTRITIONAL DEFICIENCY: You are having difficulty maintaining your weight. It is important to increase your caloric intake with Ensure supplements. We will monitor your weight and nutritional status.  INSTRUCTIONS:  Please  follow up with cardiology on October 16, 2023, for your heart conditions and with neurology on September 10, 2023, to reassess your Parkinson's disease treatment. Continue with your physical and occupational therapy at home, and make sure to drink Ensure supplements between meals to help with your nutrition. We will also order blood tests to monitor your white blood cell count.

## 2023-09-04 NOTE — Assessment & Plan Note (Signed)
 Difficulty maintaining weight, advised increased caloric intake. Stable weight during hospital monitoring. - Encourage increased caloric intake with Ensure supplements. - Monitor weight and nutritional status.

## 2023-09-04 NOTE — Assessment & Plan Note (Signed)
 Leukocytosis with peak of 16,000/L, etiology unclear, requires monitoring. - Order blood tests to monitor white blood cell count.

## 2023-09-05 ENCOUNTER — Ambulatory Visit (HOSPITAL_BASED_OUTPATIENT_CLINIC_OR_DEPARTMENT_OTHER)
Admission: RE | Admit: 2023-09-05 | Discharge: 2023-09-05 | Disposition: A | Discharge status: Home / Self Care | Source: Ambulatory Visit | Attending: Physician Assistant | Admitting: Physician Assistant

## 2023-09-05 DIAGNOSIS — S0990XA Unspecified injury of head, initial encounter: Secondary | ICD-10-CM | POA: Diagnosis not present

## 2023-09-05 DIAGNOSIS — M47816 Spondylosis without myelopathy or radiculopathy, lumbar region: Secondary | ICD-10-CM | POA: Diagnosis not present

## 2023-09-05 DIAGNOSIS — R296 Repeated falls: Secondary | ICD-10-CM

## 2023-09-05 DIAGNOSIS — Z043 Encounter for examination and observation following other accident: Secondary | ICD-10-CM | POA: Diagnosis not present

## 2023-09-05 LAB — COMPREHENSIVE METABOLIC PANEL WITH GFR
ALT: 12 IU/L (ref 0–44)
AST: 16 IU/L (ref 0–40)
Albumin: 3.9 g/dL (ref 3.6–4.6)
Alkaline Phosphatase: 128 IU/L — ABNORMAL HIGH (ref 44–121)
BUN/Creatinine Ratio: 16 (ref 10–24)
BUN: 23 mg/dL (ref 10–36)
Bilirubin Total: 0.9 mg/dL (ref 0.0–1.2)
CO2: 17 mmol/L — ABNORMAL LOW (ref 20–29)
Calcium: 8.9 mg/dL (ref 8.6–10.2)
Chloride: 109 mmol/L — ABNORMAL HIGH (ref 96–106)
Creatinine, Ser: 1.46 mg/dL — ABNORMAL HIGH (ref 0.76–1.27)
Globulin, Total: 1.9 g/dL (ref 1.5–4.5)
Glucose: 148 mg/dL — ABNORMAL HIGH (ref 70–99)
Potassium: 4.5 mmol/L (ref 3.5–5.2)
Sodium: 143 mmol/L (ref 134–144)
Total Protein: 5.8 g/dL — ABNORMAL LOW (ref 6.0–8.5)
eGFR: 45 mL/min/{1.73_m2} — ABNORMAL LOW (ref >59)

## 2023-09-05 LAB — CBC WITH DIFFERENTIAL/PLATELET
Basophils Absolute: 0.2 10*3/uL (ref 0.0–0.2)
Basos: 1 %
EOS (ABSOLUTE): 0.4 10*3/uL (ref 0.0–0.4)
Eos: 3 %
Hematocrit: 34.4 % — ABNORMAL LOW (ref 37.5–51.0)
Hemoglobin: 10.9 g/dL — ABNORMAL LOW (ref 13.0–17.7)
Immature Grans (Abs): 0.1 10*3/uL (ref 0.0–0.1)
Immature Granulocytes: 1 %
Lymphocytes Absolute: 1.1 10*3/uL (ref 0.7–3.1)
Lymphs: 7 %
MCH: 29.6 pg (ref 26.6–33.0)
MCHC: 31.7 g/dL (ref 31.5–35.7)
MCV: 94 fL (ref 79–97)
Monocytes Absolute: 0.6 10*3/uL (ref 0.1–0.9)
Monocytes: 4 %
Neutrophils Absolute: 12.9 10*3/uL — ABNORMAL HIGH (ref 1.4–7.0)
Neutrophils: 84 %
Platelets: 346 10*3/uL (ref 150–450)
RBC: 3.68 x10E6/uL — ABNORMAL LOW (ref 4.14–5.80)
RDW: 18.5 % — ABNORMAL HIGH (ref 11.6–15.4)
WBC: 15.3 10*3/uL — ABNORMAL HIGH (ref 3.4–10.8)

## 2023-09-06 ENCOUNTER — Ambulatory Visit: Payer: Self-pay | Admitting: Physician Assistant

## 2023-09-10 ENCOUNTER — Telehealth: Payer: Self-pay

## 2023-09-10 ENCOUNTER — Encounter: Payer: Self-pay | Admitting: Neurology

## 2023-09-10 ENCOUNTER — Ambulatory Visit: Admitting: Neurology

## 2023-09-10 ENCOUNTER — Ambulatory Visit: Payer: Self-pay | Admitting: Physician Assistant

## 2023-09-10 VITALS — BP 159/77 | HR 73 | Resp 15 | Ht 66.0 in

## 2023-09-10 DIAGNOSIS — G8929 Other chronic pain: Secondary | ICD-10-CM | POA: Diagnosis not present

## 2023-09-10 DIAGNOSIS — M5440 Lumbago with sciatica, unspecified side: Secondary | ICD-10-CM

## 2023-09-10 DIAGNOSIS — R269 Unspecified abnormalities of gait and mobility: Secondary | ICD-10-CM | POA: Insufficient documentation

## 2023-09-10 HISTORY — DX: Other chronic pain: G89.29

## 2023-09-10 HISTORY — DX: Unspecified abnormalities of gait and mobility: R26.9

## 2023-09-10 NOTE — Progress Notes (Signed)
 Chief Complaint  Patient presents with   New Patient (Initial Visit)    Rm14, sone present, internal referral for vertigo,diplopia, hx stroke: orthostatic bp completed, off balance, gait issues(cane for mobility), tremors bilateral hands (ongoing 4 years), pt stated that the double vision only occurs when vertigo occurs. Bilateral upper and lower extremity neuropathy (burning/tingly), braces on bilateral lower extremities, pt stated legs are cold as ice      ASSESSMENT AND PLAN  Jack Hodges is a 89 y.o. male   History of progressive worsening ascending paresthesia, weakness, gait abnormality Acute worsening since fall of 2024  Examination showed profound distal lower extremity weakness, sensory loss, well-preserved upper extremity reflex, absent at lower extremity,  Differentiation diagnosis include peripheral neuropathy, lumbar radiculopathy  MRI of lumbar spine, EMG nerve conduction study  Prescription for rolling walker   DIAGNOSTIC DATA (LABS, IMAGING, TESTING) - I reviewed patient records, labs, notes, testing and imaging myself where available.   MEDICAL HISTORY:  Jack Hodges is a 89 year old male, accompanied by his son, seen in request by his primary care from [facility] [place], [place], for evaluation of gait abnormality, initial evaluation was on September 10, 2023  History is obtained from the patient and review of electronic medical records. I personally reviewed pertinent available imaging films in PACS.   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 ( transcather)  A fib, on Eliquis    His son lives with him, reported fairly acute worsening of functional status since fall of 2024, 1 morning he drove himself to church, woke up from a nap complains of double vision, vertigo, worsening gait abnormality since then, has quit driving, began to rely  on his walker, before that, he was active in yard work, ambulate without any assistance  Patient and his son are poor historian, it is difficult to get exact timeline, he seems to have gradual onset bilateral foot numbness weakness for few years, had bilateral AFO for couple years  He also complains of multiple joints pain, now he complains of worsening lower extremity paresthesia, began to involving bilateral fingers since 2025,  He also complains of bowel and bladder incontinence, contributing to his previous hemorrhoid surgery,  He has lack of appetite, lost 50 pounds over the last couple years  He was treated at emergency room March 29, 2023 for blurry vision, double vision  MRI of the brain January 2025, no evidence of acute intracranial abnormality,  CT angiogram head and neck: Severe stenosis of right vertebral artery inferior margin, 50% stenosis of proximal left ICA in the neck, severe left P2 PCA stenosis  PHYSICAL EXAM:   Vitals:   09/10/23 1526 09/10/23 1527 09/10/23 1531  BP: (!) 154/73 (!) 151/66 (!) 159/77  Pulse: 84 82 73  Resp:  15   SpO2: 95% 95% 95%  Height:  5' 6 (1.676 m)    Body mass index is 23.4 kg/m.  PHYSICAL EXAMNIATION:  Gen: NAD, conversant, well nourised, well groomed                     Cardiovascular: Regular rate rhythm, no peripheral edema, warm, nontender. Eyes: Conjunctivae clear without exudates or hemorrhage Neck: Supple, no carotid bruits. Pulmonary: Clear to auscultation bilaterally   NEUROLOGICAL EXAM:  MENTAL STATUS: Speech/cognition: Awake, alert, oriented to history taking and casual conversation CRANIAL NERVES: CN II: Visual  fields are full to confrontation. Pupils are round equal and briskly reactive to light. CN III, IV, VI: extraocular movement are normal. No ptosis. CN V: Facial sensation is intact to light touch CN VII: Face is symmetric with normal eye closure  CN VIII: Hearing is normal to causal conversation. CN  IX, X: Phonation is normal. CN XI: Head turning and shoulder shrug are intact  MOTOR: Mild bilateral intrinsic hand muscle atrophy, proximal upper extremity muscle strength was relatively preserved, mild to moderate finger abduction, grip weakness.  Mild bilateral hip flexion weakness, atrophy from bilateral knee down, only trace movement of bilateral ankle dorsiflexion and plantarflexion,  REFLEXES: Reflexes are 2+ and symmetric at the biceps, triceps, absent at knees, and ankles. Plantar responses are flexor.  SENSORY: Preserved finger vibratory sensation proprioception, length-dependent sensory changes at lower extremity to vibratory sensation, pinprick to knee level, absent toe proprioception  COORDINATION: There is no trunk or limb dysmetria noted.  GAIT/STANCE: Profound bilateral flailed foot  REVIEW OF SYSTEMS:  Full 14 system review of systems performed and notable only for as above All other review of systems were negative.   ALLERGIES: Allergies  Allergen Reactions   Poison Oak Extract Rash    HOME MEDICATIONS: Current Outpatient Medications  Medication Sig Dispense Refill   acetaminophen  (TYLENOL ) 650 MG CR tablet Take 650 mg by mouth every 8 (eight) hours as needed for pain.      albuterol  (VENTOLIN  HFA) 108 (90 Base) MCG/ACT inhaler Inhale 2 puffs into the lungs every 2 (two) hours as needed for wheezing or shortness of breath. 6.7 g 6   ammonium lactate  (LAC-HYDRIN ) 12 % lotion Apply 1 Application topically 2 (two) times daily. Apply twice daily to arms and hands to increase moisture and reduce easy bruising.  Can also be used safely on neck, chest, and legs as needed 400 g 0   ascorbic acid  (VITAMIN C) 500 MG tablet Take 1 tablet (500 mg total) by mouth daily. 30 tablet 0   aspirin  EC 81 MG tablet Take 1 tablet (81 mg total) by mouth daily. Swallow whole. 90 tablet 1   carvedilol  (COREG ) 12.5 MG tablet Take 12.5 mg by mouth 2 (two) times daily.     cholecalciferol   (VITAMIN D3) 25 MCG (1000 UNIT) tablet Take 1 tablet (1,000 Units total) by mouth daily. 90 tablet 1   ELIQUIS  5 MG TABS tablet Take 5 mg by mouth 2 (two) times daily.     ezetimibe  (ZETIA ) 10 MG tablet Take 1 tablet (10 mg total) by mouth daily. 90 tablet 1   fluorometholone (FML) 0.1 % ophthalmic suspension Place 1 drop into both eyes daily.     furosemide  (LASIX ) 20 MG tablet Take 20 mg by mouth 2 (two) times daily.     neomycin-polymyxin b-dexamethasone  (MAXITROL) 3.5-10000-0.1 SUSP Place 1 drop into both eyes every 6 (six) hours.     nitroGLYCERIN  (NITROSTAT ) 0.4 MG SL tablet Place 1 tablet (0.4 mg total) under the tongue every 5 (five) minutes as needed for chest pain. 25 tablet 2   pantoprazole  (PROTONIX ) 40 MG tablet Take 1 tablet (40 mg total) by mouth daily. 100 tablet 1   potassium chloride  (KLOR-CON ) 10 MEQ tablet Take 1 tablet (10 mEq total) by mouth daily. 90 tablet 3   pregabalin  (LYRICA ) 75 MG capsule Take 1 capsule (75 mg total) by mouth 2 (two) times daily. 90 capsule 0   rosuvastatin  (CRESTOR ) 20 MG tablet Take 1 tablet (20 mg total) by mouth  at bedtime. 90 tablet 3   sertraline  (ZOLOFT ) 50 MG tablet TAKE ONE TABLET BY MOUTH ONCE DAILY 90 tablet 1   Sodium Sulfate-Mag Sulfate-KCl (SUTAB ) 1479-225-188 MG TABS Use as directed for colonoscopy. MANUFACTURER CODES!! BIN: [id number] PCN: CN GROUP: [id number] MEMBER ID: [id number];GNF AS SECONDARY INSURANCE ;NO PRIOR AUTHORIZATION 24 tablet 0   vitamin B-12 (CYANOCOBALAMIN ) 500 MCG tablet Take 500 mcg by mouth daily.     zinc  sulfate 220 (50 Zn) MG capsule Take 1 capsule (220 mg total) by mouth daily. 90 capsule 1   No current facility-administered medications for this visit.    PAST MEDICAL HISTORY: Past Medical History:  Diagnosis Date   Acute bursitis of left shoulder 01/04/2020   Acute on chronic diastolic congestive heart failure (HCC) 11/13/2021   Last Assessment & Plan:  Formatting of this note is different from the original.  Pertinent Data:   Current medication includes: furosemide  - 20 mg lisinopriL  - 10 mg metoprolol  succinate - 25 mg.  BNP (pg/mL)  Date Value  11/15/2021 433.6 (H)   eGFR (mL/min/1.45m*2)  Date Value  11/15/2021 >60.0    BP Readings from Last 3 Encounters:  11/30/21 (!) 147/82  11/15/21 (!) 109/54  11/13/21 (!) 134/74     Acute respiratory failure with hypoxia (HCC) 03/17/2020   Angina pectoris (HCC) 10/07/2019   Arthritis    Atherosclerosis of aorta (HCC) 09/24/2019   Bilateral foot-drop 01/18/2017   BMI 27.0-27.9,adult 09/24/2019   Coronary artery disease involving native coronary artery of native heart without angina pectoris 11/01/2015   Overview:  Bypass surgery in 1996 Overview:  Overview:  Bypass surgery in 1996  Last Assessment & Plan:  Continue his current regimen.  Follow up as noted.   CVA (cerebral vascular accident) (HCC) 04/03/2016   Last Assessment & Plan:  The patient underwent extensive CVA workup.  MRI is negative.  He does have aortic stenosis by echocardiogram.  Upon review of Care everywhere he is followed by Dr. Gordan Latina, cardiologist for this.  Carotid ultrasound negative for hemodynamically significant stenosis.  Lipid panel within normal limits.  He has not had any further episodes of dizziness or nausea vomiting.    Demand ischemia (HCC) 11/14/2021   Last Assessment & Plan:  Formatting of this note might be different from the original. Troponin flat No anginal symptoms Formatting of this note might be different from the original. Last Assessment & Plan:  Formatting of this note might be different from the original. Troponin flat No anginal symptoms   Dementia (HCC)    Depression    DNR (do not resuscitate) 04/03/2016   Overview:  I had an extensive discussion with the patient on 04/03/2016 regarding his end of life wishes.  He and his daughter agree that do not resuscitate is in keeping with his beliefs.   Dyslipidemia 11/01/2015   Last Assessment & Plan:  Formatting of  this note might be different from the original. Cholesterol 136   Triglycerides  83  HDL  50.1  LDL Calculated  69  VLDL Cholesterol Cal  17   Continue statin.   Dyspnea    with activity   Essential hypertension 11/01/2015   Last Assessment & Plan:  Resume home medications.  Follow-up as noted above.   Generalized weakness 03/13/2020   GERD (gastroesophageal reflux disease)    Herpes zoster 05/19/2019   History of aortic stenosis 03/11/2020   History of CVA (cerebrovascular accident) 11/14/2021   Last Assessment & Plan:  Formatting of this  note might be different from the original. Neurologically intact upon assessment -continue aspirin , statin   Hypertensive heart disease with heart failure (HCC) 11/01/2015   Last Assessment & Plan:  Resume home medications.  Follow-up as noted above.   Myocardial infarction (HCC)    approx 1994   Neuropathy    Peripheral neuropathy 11/14/2021   Last Assessment & Plan:  Formatting of this note might be different from the original. Bilateral lower extremity cool to touch with diminished sensation Patient reports findings are chronic Continue home dose Lyrica  Formatting of this note might be different from the original. Last Assessment & Plan:  Formatting of this note might be different from the original. 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   Thrombocytopenia (HCC) 03/13/2020   Trochanteric bursitis of left hip 01/19/2021    PAST SURGICAL HISTORY: Past Surgical History:  Procedure Laterality Date    CARDIAC CATHETERIZATION     CARDIAC SURGERY     CATARACT EXTRACTION, BILATERAL     COLONOSCOPY     [facility]   COLONOSCOPY N/A 07/04/2023   Procedure: COLONOSCOPY;  Surgeon: Elois Hair, MD;  Location[facility] ENDOSCOPY;  Service: Gastroenterology;  Laterality: N/A;   CORONARY ARTERY BYPASS GRAFT     CORONARY STENT INTERVENTION N/A 10/07/2019   Procedure: CORONARY STENT INTERVENTION;  Surgeon: Arnoldo Lapping, MD;  Location: [facility] INVASIVE CV LAB;  Service: Cardiovascular;  Laterality: N/A;   ESOPHAGOGASTRODUODENOSCOPY N/A 07/04/2023   Procedure: EGD (ESOPHAGOGASTRODUODENOSCOPY);  Surgeon: Elois Hair, MD;  Location: [facility] ENDOSCOPY;  Service: Gastroenterology;  Laterality: N/A;   EYE SURGERY     HEMORROIDECTOMY     RIGHT/LEFT HEART CATH AND CORONARY/GRAFT ANGIOGRAPHY N/A 10/07/2019   Procedure: RIGHT/LEFT HEART CATH AND CORONARY/GRAFT ANGIOGRAPHY;  Surgeon: Arnoldo Lapping, MD;  Location: [facility] INVASIVE CV LAB;  Service: Cardiovascular;  Laterality: N/A;   TEE WITHOUT CARDIOVERSION N/A 10/27/2019   Procedure: TRANSESOPHAGEAL ECHOCARDIOGRAM (TEE);  Surgeon: Odie Benne, MD;  Location: [facility] INVASIVE CV LAB;  Service: Open Heart Surgery;  Laterality: N/A;   TRANSCATHETER AORTIC VALVE REPLACEMENT, TRANSFEMORAL N/A 10/27/2019   Procedure: TRANSCATHETER AORTIC VALVE REPLACEMENT, TRANSFEMORAL;  Surgeon: Odie Benne, MD;  Location: [facility] INVASIVE CV LAB;  Service: Open Heart Surgery;  Laterality: N/A;    FAMILY HISTORY: Family History  Problem Relation Age of Onset   Cancer Mother        bone   Diabetes Mother    Kidney disease Mother    Stroke Mother    Sleep disorder Mother    Arthritis Father    Migraines Father    Hypertension Father    Sleep disorder Father    Arthritis Brother    Cancer Brother        throat   Throat cancer Daughter     SOCIAL HISTORY: Social History   Socioeconomic History   Marital status: Widowed    Spouse name: Juanita    Number of children: 2   Years of education: Not on file   Highest education level: 7th grade  Occupational  History   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  He has one son that lives with him and one daughter that lives beside of him. He is close with his family.  His sister cooks meals for him regularly.  He has friends at the cafe he visits regularly.   Social Drivers of Corporate investment banker Strain: Low Risk  (03/07/2023)   Overall Financial Resource Strain (CARDIA)    Difficulty of Paying Living Expenses: Not hard at all  Food Insecurity: No Food Insecurity (06/18/2023)   Hunger Vital Sign    Worried About Running Out of Food in the Last Year: Never true    Ran Out of Food in the Last Year: Never true  Transportation Needs: No Transportation Needs (06/18/2023)   PRAPARE - Administrator, Civil Service (Medical): No    Lack of Transportation (Non-Medical): No  Physical Activity: Insufficiently Active (03/07/2023)   Exercise Vital Sign    Days of Exercise per Week: 2 days    Minutes of Exercise per Session: 10 min  Stress: No Stress Concern Present (03/07/2023)   [employer] of Occupational Health - Occupational Stress Questionnaire    Feeling of Stress : Not at all  Social Connections: Moderately Isolated (03/07/2023)   Social Connection and Isolation Panel    Frequency of Communication with Friends and Family: More than three times a week    Frequency of Social Gatherings with Friends  and Family: More than three times a week    Attends Religious Services: 1 to 4 times per year    Active Member of [employer] or Organizations: No    Attends Banker Meetings: Never    Marital Status: Widowed  Intimate Partner Violence: Not At Risk (06/18/2023)   Humiliation, Afraid, Rape, and Kick questionnaire    Fear of Current or Ex-Partner: No    Emotionally Abused: No    Physically Abused: No    Sexually Abused: No      Phebe Brasil, M.D. Ph.D.  [facility] [address] [place], [place] [address] Ph: [phone] Fax: [phone]  CC:  [name], [place] [address] [place],  [place] [address]  [name], [place]

## 2023-09-10 NOTE — Telephone Encounter (Signed)
 Called Jack Hodges and let him know that provider is agreed.   Copied from CRM [id number]. Topic: General - Other >> Sep 10, 2023  9:18 AM [name] wrote: Reason for CRM: Jack Hodges [facility] Therapist Opal Bill [facility] Was in the hospital for a fall Needs orders: 1w1 then 2w1 secured vmail.. [phone]

## 2023-09-11 ENCOUNTER — Telehealth: Payer: Self-pay | Admitting: Physician Assistant

## 2023-09-11 ENCOUNTER — Telehealth: Payer: Self-pay | Admitting: Neurology

## 2023-09-11 NOTE — Telephone Encounter (Signed)
 [facility] Supplemental Order from 09/01/23 to 09/08/23

## 2023-09-11 NOTE — Telephone Encounter (Signed)
 no auth required sent to [employer] [phone]

## 2023-09-13 ENCOUNTER — Encounter: Payer: Self-pay | Admitting: Family Medicine

## 2023-09-17 ENCOUNTER — Telehealth: Payer: Self-pay | Admitting: Physician Assistant

## 2023-09-17 ENCOUNTER — Telehealth: Payer: Self-pay

## 2023-09-17 NOTE — Telephone Encounter (Signed)
 Copied from CRM [id number]. Topic: General - Other >> Sep 17, 2023  8:28 AM [name] wrote: Reason for CRM: Marjorie w/[facility] therapy yesterday.. was using Voltaren yesterday and he did not have a reaction.. they added to the med profile. can he continue?  can call back [phone] ( secured line )

## 2023-09-17 NOTE — Telephone Encounter (Signed)
 [facility] Supplemental Orders from 09/09/23 to 09/15/23

## 2023-09-18 ENCOUNTER — Ambulatory Visit: Payer: Self-pay | Admitting: Cardiology

## 2023-09-25 ENCOUNTER — Telehealth: Payer: Self-pay | Admitting: Neurology

## 2023-09-25 DIAGNOSIS — R262 Difficulty in walking, not elsewhere classified: Secondary | ICD-10-CM | POA: Diagnosis not present

## 2023-09-25 NOTE — Telephone Encounter (Signed)
 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 Very mild mass effect on the frontoparietal brain parenchyma without midline shift.

## 2023-09-26 NOTE — Telephone Encounter (Signed)
 Son returned call and I reviewed results. He verbalized understanding

## 2023-09-26 NOTE — Telephone Encounter (Signed)
Call to patient, no answer. Left message to call back.

## 2023-09-29 NOTE — Progress Notes (Signed)
 [facility] [address] [place],  [place]  [address] [phone]  Clinic Day:  09/30/2023  Referring physician: [name], PA  HISTORY OF PRESENT ILLNESS:  Jack Hodges is a 89 y.o. male with iron deficiency anemia.  Labs have also shown his to have leukocytosis, and thrombocytosis.  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  He comes in today to reassess his iron and hemoglobin levels after receiving IV iron in April 2025. Since his last visit, the patient has been doing okay.  Due to prior GI blood loss, he underwent both a colonoscopy and EGD in April 2025.  His colonoscopy revealed diverticulosis and nonbleeding internal hemorrhoids.  2 nonbleeding gastric ulcers were seen with his EGD.  These ulcers are thought to be the reason behind his recent GI blood loss.  Since receiving his IV iron, he has felt better.  He denies noticing any more overt forms of blood loss since his last visit.  VITALS:  Blood pressure (!) 166/92, pulse (!) 54, temperature 97.7 F (36.5 C), temperature source Oral, resp. rate 16, height 5' 6 (1.676 m), weight 150 lb (68 kg), SpO2 96%.  Wt Readings from Last 3 Encounters:  09/30/23 150 lb (68 kg)  09/04/23 145 lb (65.8 kg)  08/28/23 141 lb (64 kg)    Body mass index is 24.21 kg/m.  Performance status (ECOG): 3 - Symptomatic, >50% confined to bed  PHYSICAL EXAM:  Physical Exam Vitals and nursing note reviewed.  Constitutional:      General: He is not in acute distress.    Appearance: Normal appearance. He is normal weight.     Comments: He is in a wheelchair, but physically looks better versus previous visits.  HENT:     Head: Normocephalic and atraumatic.     Mouth/Throat:     Mouth: Mucous membranes are moist.     Pharynx: Oropharynx is clear. No oropharyngeal exudate or posterior oropharyngeal erythema.  Eyes:     General: No scleral icterus.     Extraocular Movements: Extraocular movements intact.     Conjunctiva/sclera: Conjunctivae normal.     Pupils: Pupils are equal, round, and reactive to light.  Cardiovascular:     Rate and Rhythm: Normal rate and regular rhythm.     Heart sounds: Normal heart sounds. No murmur heard.    No friction rub. No gallop.  Pulmonary:     Effort: Pulmonary effort is normal.     Breath sounds: Normal breath sounds. No wheezing, rhonchi or rales.  Abdominal:     General: Bowel sounds are normal. There is no distension.     Palpations: Abdomen is soft. There is no mass.     Tenderness: There is no abdominal tenderness.  Musculoskeletal:        General: Normal range of motion.     Cervical back: Normal range of motion and neck supple. No tenderness.     Right lower leg: No edema.     Left lower leg: No edema.  Lymphadenopathy:     Cervical: No cervical adenopathy.     Upper Body:     Right upper body: No supraclavicular or axillary adenopathy.     Left upper body: No supraclavicular or axillary adenopathy.     Lower Body: No right inguinal adenopathy. No left inguinal adenopathy.  Skin:    General: Skin is warm and dry.     Coloration:  Skin is not jaundiced.     Findings: No rash.  Neurological:     Mental Status: He is alert and oriented to person, place, and time.     Cranial Nerves: No cranial nerve deficit.  Psychiatric:        Mood and Affect: Mood normal.        Behavior: Behavior normal.        Thought Content: Thought content normal.    LABS:      Latest Ref Rng & Units 09/30/2023    1:42 PM 09/04/2023    3:09 PM 07/01/2023   10:37 AM  CBC  WBC 4.0 - 10.5 K/uL 14.4  15.3  16.2   Hemoglobin 13.0 - 17.0 g/dL 88.2  89.0  9.3   Hematocrit 39.0 - 52.0 % 36.5  34.4  28.9   Platelets 150 - 400 K/uL 316  346  428     Latest Reference Range & Units 09/30/23 13:42  Neutrophils % 81  Lymphocytes % 8  Monocytes Relative % 4  Eosinophil % 4  Basophil % 2  Immature Granulocytes % 1     Latest Reference Range & Units 06/18/23 13:51 09/30/23 13:42  Iron 45 - 182 ug/dL 31 (L) 41 (L)  UIBC ug/dL 751 743  TIBC 749 - 549 ug/dL 720 702  Saturation Ratios 17.9 - 39.5 % 11 (L) 14 (L)  Ferritin 24 - 336 ng/mL 201 299  (L): Data is abnormally low  JAK2 V617F Result Comment   Comment: (NOTE) POSITIVE The JAK2 V617F mutation is detected in the provided specimen of this individual. Results should be interpreted in conjunction with clinical and other laboratory findings for the most accurate interpretation.      Latest Ref Rng & Units 09/30/2023    1:42 PM 09/04/2023    3:09 PM 07/01/2023   10:37 AM  CMP  Glucose 70 - 99 mg/dL 899  851  826   BUN 8 - 23 mg/dL 22  23  31    Creatinine 0.61 - 1.24 mg/dL 8.81  8.53  8.45   Sodium 135 - 145 mmol/L 141  143  141   Potassium 3.5 - 5.1 mmol/L 4.1  4.5  3.0   Chloride 98 - 111 mmol/L 108  109  103   CO2 22 - 32 mmol/L 19  17  22    Calcium  8.9 - 10.3 mg/dL 9.5  8.9  9.0   Total Protein 6.5 - 8.1 g/dL 6.6  5.8  5.8   Total Bilirubin 0.0 - 1.2 mg/dL 1.2  0.9  0.9   Alkaline Phos 38 - 126 U/L 180  128  120   AST 15 - 41 U/L 26  16  13    ALT 0 - 44 U/L 10  12  <5     ASSESSMENT & PLAN:  Assessment/Plan:  Jack Hodges is a 89 y.o. male with iron deficiency anemia.  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  Since receiving his IV iron, this gentleman's hemoglobin has improved by nearly 2-1/2 g.  Furthermore, his platelets have returned to normal.  He still has an elevated white count, but it is not as high as it was previously.  This gentleman's iron parameters are also better after receiving IV iron.  Overall, his iron parameters have a pattern that is more consistent with anemia of chronic disease, not iron deficiency anemia.  As his counts  are better, they will be followed conservatively over these next few months.  I will see him back in 4 months for  repeat clinical assessment.  The patient and his family understand all the plans discussed today and are in agreement with them.    Mireyah Chervenak DELENA Kerns, MD

## 2023-09-30 ENCOUNTER — Inpatient Hospital Stay: Attending: Hematology and Oncology | Admitting: Oncology

## 2023-09-30 ENCOUNTER — Telehealth: Payer: Self-pay | Admitting: Oncology

## 2023-09-30 ENCOUNTER — Inpatient Hospital Stay

## 2023-09-30 ENCOUNTER — Other Ambulatory Visit: Payer: Self-pay | Admitting: Oncology

## 2023-09-30 VITALS — BP 166/92 | HR 54 | Temp 97.7°F | Resp 16 | Ht 66.0 in | Wt 150.0 lb

## 2023-09-30 DIAGNOSIS — D649 Anemia, unspecified: Secondary | ICD-10-CM

## 2023-09-30 DIAGNOSIS — R7989 Other specified abnormal findings of blood chemistry: Secondary | ICD-10-CM

## 2023-09-30 DIAGNOSIS — D509 Iron deficiency anemia, unspecified: Secondary | ICD-10-CM | POA: Diagnosis not present

## 2023-09-30 DIAGNOSIS — D500 Iron deficiency anemia secondary to blood loss (chronic): Secondary | ICD-10-CM

## 2023-09-30 DIAGNOSIS — D75839 Thrombocytosis, unspecified: Secondary | ICD-10-CM | POA: Diagnosis not present

## 2023-09-30 DIAGNOSIS — D72829 Elevated white blood cell count, unspecified: Secondary | ICD-10-CM | POA: Diagnosis not present

## 2023-09-30 DIAGNOSIS — G5603 Carpal tunnel syndrome, bilateral upper limbs: Secondary | ICD-10-CM | POA: Diagnosis not present

## 2023-09-30 LAB — CMP (CANCER CENTER ONLY)
ALT: 10 U/L (ref 0–44)
AST: 26 U/L (ref 15–41)
Albumin: 4.4 g/dL (ref 3.5–5.0)
Alkaline Phosphatase: 180 U/L — ABNORMAL HIGH (ref 38–126)
Anion gap: 14 (ref 5–15)
BUN: 22 mg/dL (ref 8–23)
CO2: 19 mmol/L — ABNORMAL LOW (ref 22–32)
Calcium: 9.5 mg/dL (ref 8.9–10.3)
Chloride: 108 mmol/L (ref 98–111)
Creatinine: 1.18 mg/dL (ref 0.61–1.24)
GFR, Estimated: 59 mL/min — ABNORMAL LOW (ref >60)
Glucose, Bld: 100 mg/dL — ABNORMAL HIGH (ref 70–99)
Potassium: 4.1 mmol/L (ref 3.5–5.1)
Sodium: 141 mmol/L (ref 135–145)
Total Bilirubin: 1.2 mg/dL (ref 0.0–1.2)
Total Protein: 6.6 g/dL (ref 6.5–8.1)

## 2023-09-30 LAB — CBC WITH DIFFERENTIAL (CANCER CENTER ONLY)
Abs Immature Granulocytes: 0.2 K/uL — ABNORMAL HIGH (ref 0.00–0.07)
Basophils Absolute: 0.2 K/uL — ABNORMAL HIGH (ref 0.0–0.1)
Basophils Relative: 2 %
Eosinophils Absolute: 0.5 K/uL (ref 0.0–0.5)
Eosinophils Relative: 4 %
HCT: 36.5 % — ABNORMAL LOW (ref 39.0–52.0)
Hemoglobin: 11.7 g/dL — ABNORMAL LOW (ref 13.0–17.0)
Immature Granulocytes: 1 %
Immature Platelet Fraction: 11.1 % — ABNORMAL HIGH (ref 1.2–8.6)
Lymphocytes Relative: 8 %
Lymphs Abs: 1.2 K/uL (ref 0.7–4.0)
MCH: 29.3 pg (ref 26.0–34.0)
MCHC: 32.1 g/dL (ref 30.0–36.0)
MCV: 91.3 fL (ref 80.0–100.0)
Monocytes Absolute: 0.6 K/uL (ref 0.1–1.0)
Monocytes Relative: 4 %
Neutro Abs: 11.7 K/uL — ABNORMAL HIGH (ref 1.7–7.7)
Neutrophils Relative %: 81 %
Platelet Count: 316 K/uL (ref 150–400)
RBC: 4 MIL/uL — ABNORMAL LOW (ref 4.22–5.81)
RDW: 19.9 % — ABNORMAL HIGH (ref 11.5–15.5)
WBC Count: 14.4 K/uL — ABNORMAL HIGH (ref 4.0–10.5)
nRBC: 0.2 % (ref 0.0–0.2)

## 2023-09-30 LAB — IRON AND TIBC
Iron: 41 ug/dL — ABNORMAL LOW (ref 45–182)
Saturation Ratios: 14 % — ABNORMAL LOW (ref 17.9–39.5)
TIBC: 297 ug/dL (ref 250–450)
UIBC: 256 ug/dL

## 2023-09-30 LAB — FERRITIN: Ferritin: 299 ng/mL (ref 24–336)

## 2023-09-30 NOTE — Telephone Encounter (Signed)
 Patient has been scheduled for follow-up visit per 09/30/23 LOS.  Pt given an appt calendar with date and time.

## 2023-10-01 ENCOUNTER — Telehealth: Payer: Self-pay | Admitting: *Deleted

## 2023-10-01 NOTE — Telephone Encounter (Signed)
   Pre-operative Risk Assessment    Patient Name: Jack Hodges  DOB: Jan 03, 1931 MRN: [id number]   Date of last office visit: 04/04/23 DR. KRASOWSKI Date of next office visit: 10/16/23 DR. KRASOWKI   Request for Surgical Clearance    Procedure:  RIGHT CARPAL TUNNEL RELEASE   Date of Surgery:  Clearance TBD                                Surgeon:  DR. CHARLES BENFIELD Surgeon's Group or Practice Name:  [facility] Phone number:  [phone] Fax number:  [phone] SHERR[facility] WILLS   Type of Clearance Requested:   - Medical  - Pharmacy:  Hold Apixaban  (Eliquis )     Type of Anesthesia:  MAC WITH LOCAL   Additional requests/questions:    Bonney Niels Jest   10/01/2023, 12:33 PM

## 2023-10-02 ENCOUNTER — Telehealth: Payer: Self-pay

## 2023-10-02 NOTE — Telephone Encounter (Addendum)
 Dr Ezzard: His iron parameters are better . I will see him back in 4 months to reassess his peripheral counts and iron levels.    Latest Reference Range & Units 09/30/23 13:42  Iron 45 - 182 ug/dL 41 (L)  UIBC ug/dL 743  TIBC 749 - 549 ug/dL 702  Saturation Ratios 17.9 - 39.5 % 14 (L)  Ferritin 24 - 336 ng/mL 299  (L): Data is abnormally low

## 2023-10-03 ENCOUNTER — Other Ambulatory Visit: Payer: Self-pay | Admitting: Physician Assistant

## 2023-10-03 DIAGNOSIS — K2100 Gastro-esophageal reflux disease with esophagitis, without bleeding: Secondary | ICD-10-CM

## 2023-10-03 MED ORDER — PANTOPRAZOLE SODIUM 40 MG PO TBEC: 40.0000 mg | DELAYED_RELEASE_TABLET | Freq: Every day | ORAL | 1 refills | Status: DC

## 2023-10-03 NOTE — Telephone Encounter (Signed)
 Copied from CRM [id number]. Topic: Clinical - Medication Refill >> Oct 03, 2023  1:16 PM Carla L wrote: Medication: pantoprazole  (PROTONIX ) 40 MG tablet  Has the patient contacted their pharmacy? Yes Told to contact office, out of refills.  This is the patient's preferred pharmacy:  [facility] - [place] - [place], [place] - [address] [place] [address] Phone: [phone] Fax: [phone]  Is this the correct pharmacy for this prescription? Yes  Has the prescription been filled recently? No  Is the patient out of the medication? No  Has the patient been seen for an appointment in the last year OR does the patient have an upcoming appointment? Yes  Can we respond through MyChart? Yes  Agent: Please be advised that Rx refills may take up to 3 business days. We ask that you follow-up with your pharmacy.

## 2023-10-07 DIAGNOSIS — S065X9A Traumatic subdural hemorrhage with loss of consciousness of unspecified duration, initial encounter: Secondary | ICD-10-CM | POA: Diagnosis not present

## 2023-10-07 DIAGNOSIS — S50311A Abrasion of right elbow, initial encounter: Secondary | ICD-10-CM | POA: Diagnosis not present

## 2023-10-07 DIAGNOSIS — R0789 Other chest pain: Secondary | ICD-10-CM | POA: Diagnosis not present

## 2023-10-07 DIAGNOSIS — S50312A Abrasion of left elbow, initial encounter: Secondary | ICD-10-CM | POA: Diagnosis not present

## 2023-10-07 DIAGNOSIS — I6203 Nontraumatic chronic subdural hemorrhage: Secondary | ICD-10-CM | POA: Diagnosis not present

## 2023-10-07 DIAGNOSIS — R296 Repeated falls: Secondary | ICD-10-CM | POA: Diagnosis not present

## 2023-10-07 DIAGNOSIS — K8020 Calculus of gallbladder without cholecystitis without obstruction: Secondary | ICD-10-CM | POA: Diagnosis not present

## 2023-10-07 DIAGNOSIS — Z043 Encounter for examination and observation following other accident: Secondary | ICD-10-CM | POA: Diagnosis not present

## 2023-10-07 DIAGNOSIS — W19XXXA Unspecified fall, initial encounter: Secondary | ICD-10-CM | POA: Diagnosis not present

## 2023-10-07 DIAGNOSIS — J811 Chronic pulmonary edema: Secondary | ICD-10-CM | POA: Diagnosis not present

## 2023-10-07 NOTE — Telephone Encounter (Signed)
 Patient with diagnosis of afib on Eliquis  for anticoagulation.    Procedure: RIGHT CARPAL TUNNEL RELEASE  Date of procedure: TBD   CHA2DS2-VASc Score = 7   This indicates a 11.2% annual risk of stroke. The patient's score is based upon: CHF History: 1 HTN History: 1 Diabetes History: 0 Stroke History: 2 (TIA) Vascular Disease History: 1 Age Score: 2 Gender Score: 0      CrCl 40 ml/min Platelet count 316  Patient has not had an Afib/aflutter ablation within the last 3 months or DCCV within the last 30 days  Per office protocol, patient can hold Eliquis  for 2 days prior to procedure.    **This guidance is not considered finalized until pre-operative APP has relayed final recommendations.**

## 2023-10-08 NOTE — Telephone Encounter (Signed)
   Name: Jack Hodges  DOB: 09/05/1930  MRN: [id number]  Primary Cardiologist: [name], MD  Chart reviewed as part of pre-operative protocol coverage. The patient has an upcoming visit scheduled with Dr. Fitch on 10/16/23 at which time clearance can be addressed in case there are any issues that would impact surgical recommendations.  Right carpal tunnel release is not scheduled as below. I added preop FYI to appointment note so that provider is aware to address at time of outpatient visit.  Per office protocol the cardiology provider should forward their finalized clearance decision and recommendations regarding antiplatelet therapy to the requesting party below.    Per office protocol, patient can hold Eliquis  for 2 days prior to procedure.    I will route this message as FYI to requesting party and remove this message from the preop box as separate preop APP input not needed at this time.   Please call with any questions.  Taylr Meuth D Evaristo Tsuda, NP  10/08/2023, 4:31 PM

## 2023-10-09 ENCOUNTER — Ambulatory Visit: Payer: Self-pay

## 2023-10-09 NOTE — Telephone Encounter (Signed)
  FYI Only or Action Required?: Action required by provider: update on patient condition.  Patient was last seen in primary care on 09/04/2023 by [name], PA.  Called Nurse Triage reporting MEDICATION QUESTION.  Symptoms began today.  Interventions attempted: Prescription medications: eliquis .  Symptoms are: unchanged.  Triage Disposition: Call PCP When Office is Open  Patient/caregiver understands and will follow disposition?: Yes  Copied from CRM [id number]. Topic: Clinical - Medication Question >> Oct 09, 2023 12:28 PM Jack Hodges wrote: Reason for CRM: Pt has fallen 3 times, was seen in ED Monday for this. Has sustained injuries. Pt's son has questions for PCP concerning the patient's eliquis .   Best contact:  [phone]   ----------------------------------------------------------------------- From previous Reason for Contact - Red Word Triage: Red Word that prompted transfer to Nurse Triage: Pt has fallen 3 times, was seen in ED Monday for this. Has sustained injuries. Pt's son has questions for PCP concerning the patient's eliquis . Reason for Disposition  [1] Caller has NON-URGENT medicine question about med that PCP prescribed AND [2] triager unable to answer question  Answer Assessment - Initial Assessment Questions 1. NAME of MEDICINE: What medicine(s) are you calling about?     eliquis  2. QUESTION: What is your question? (e.g., double dose of medicine, side effect)     Patient concerned about bleeding more easily since starting medication 3. PRESCRIBER: Who prescribed the medicine? Reason: if prescribed by specialist, call should be referred to that group.     Prescribed during recent hospitalization    Spoke to patients son who is primary caregiver.  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   Needs to schedule follow up for 10/2023, but would like to know if B. Craft, PA-C would like to see patient  sooner  Protocols used: Medication Question Call-A-AH

## 2023-10-10 ENCOUNTER — Telehealth: Payer: Self-pay

## 2023-10-10 NOTE — Telephone Encounter (Signed)
 Jack Hodges

## 2023-10-10 NOTE — Addendum Note (Signed)
 Addended by: Toiya Morrish L on: 10/10/2023 11:55 AM   Modules accepted: Orders

## 2023-10-14 ENCOUNTER — Ambulatory Visit (INDEPENDENT_AMBULATORY_CARE_PROVIDER_SITE_OTHER): Admitting: Physician Assistant

## 2023-10-14 VITALS — BP 102/74 | HR 71 | Temp 97.7°F | Resp 16 | Ht 66.0 in | Wt 154.0 lb

## 2023-10-14 DIAGNOSIS — Z8711 Personal history of peptic ulcer disease: Secondary | ICD-10-CM | POA: Diagnosis not present

## 2023-10-14 DIAGNOSIS — K5904 Chronic idiopathic constipation: Secondary | ICD-10-CM | POA: Diagnosis not present

## 2023-10-14 DIAGNOSIS — U071 COVID-19: Secondary | ICD-10-CM

## 2023-10-14 DIAGNOSIS — J1282 Pneumonia due to coronavirus disease 2019: Secondary | ICD-10-CM | POA: Diagnosis not present

## 2023-10-14 DIAGNOSIS — S20211S Contusion of right front wall of thorax, sequela: Secondary | ICD-10-CM

## 2023-10-14 DIAGNOSIS — I509 Heart failure, unspecified: Secondary | ICD-10-CM | POA: Diagnosis not present

## 2023-10-14 DIAGNOSIS — D500 Iron deficiency anemia secondary to blood loss (chronic): Secondary | ICD-10-CM | POA: Diagnosis not present

## 2023-10-14 MED ORDER — ALBUTEROL SULFATE HFA 108 (90 BASE) MCG/ACT IN AERS: 2.0000 | INHALATION_SPRAY | RESPIRATORY_TRACT | 6 refills | Status: DC | PRN

## 2023-10-14 MED ORDER — TRAMADOL HCL 50 MG PO TABS: 50.0000 mg | ORAL_TABLET | Freq: Two times a day (BID) | ORAL | 0 refills | Status: DC | PRN

## 2023-10-14 NOTE — Progress Notes (Unsigned)
 Subjective:  Patient ID: Jack Hodges, male    DOB: June 02, 1930  Age: 89 y.o. MRN: [id number]  Chief Complaint  Patient presents with   Hospitalization Follow-up    FALL    HPI:  Discussed the use of AI scribe software for clinical note transcription with the patient, who gave verbal consent to proceed.  History of Present Illness   Jack Hodges is a 89 year old male with a history of falls and dizziness who presents with persistent dizziness and balance issues.  He experiences persistent dizziness and balance issues, with frequent dizzy spells despite using meclizine , which has not provided improvement. He has had multiple falls, each time hitting his head, and one fall resulted in injury to his right side when he hit the toilet and glass shower doors.  He has a persistent cough, particularly at night when lying in bed. He uses an incentive spirometer to aid in breathing exercises. He has not refilled his albuterol  inhaler, which he uses for breathing difficulties.  He experienced three falls, injuring his right side. Imaging, including x-rays and a CT scan, did not reveal any fractures. He has a large bruise on his right side, which is 'whiter' in color where it hurts.  He has a history of stomach ulcers, identified during an EGD, suspected to be the source of his iron deficiency. He receives treatment at a cancer center and reports his blood work has improved since his last visit.  He experiences urinary frequency at night, disrupting his sleep and increasing his risk of falls. He takes a diuretic in the morning and before bed but has reduced to one dose to manage nighttime urination. He also uses a fiber supplement to aid with bowel movements, as he experiences constipation.  He has a history of low blood pressure. He currently drinks only water with his pills and two cups of coffee daily.  He has a history of elevated pro BNP levels, with a recent level over 9,000, up from 3,500  ten months ago. He has not had recent echocardiography to assess heart function and is scheduled to see a cardiologist soon.          09/30/2023    3:00 PM 06/18/2023    2:29 PM 03/07/2023    1:07 PM 12/26/2022   11:30 AM 01/24/2022    8:26 PM  Depression screen PHQ 2/9  Decreased Interest 0 0 0 0 2  Down, Depressed, Hopeless 0 0 0 0 2  PHQ - 2 Score 0 0 0 0 4  Altered sleeping   0  2  Tired, decreased energy   0  1  Change in appetite   3  2  Feeling bad or failure about yourself    0  1  Trouble concentrating   0  0  Moving slowly or fidgety/restless   0  0  Suicidal thoughts   0  0  PHQ-9 Score   3  10  Difficult doing work/chores   Not difficult at all  Not difficult at all        03/07/2023    1:07 PM  Fall Risk   Falls in the past year? 0  Number falls in past yr: 0  Injury with Fall? 0  Risk for fall due to : No Fall Risks  Follow up Falls prevention discussed    Patient Care Team: [name], [place] as PCP - General (Physician Assistant) Bernie Lamar PARAS, MD as PCP -  Cardiology (Cardiology) Ezzard Valaria LABOR, MD as Consulting Physician (Oncology)   Review of Systems  Constitutional:  Negative for appetite change, fatigue and fever.  HENT:  Negative for congestion, ear pain, sinus pressure and sore throat.   Eyes: Negative.   Respiratory:  Negative for cough, chest tightness, shortness of breath and wheezing.   Cardiovascular:  Negative for chest pain and palpitations.  Gastrointestinal:  Positive for constipation. Negative for abdominal pain, diarrhea, nausea and vomiting.  Endocrine: Negative.   Genitourinary:  Negative for dysuria, frequency, hematuria and urgency.  Musculoskeletal:  Negative for arthralgias, back pain, joint swelling and myalgias.  Skin:  Negative for rash.  Allergic/Immunologic: Negative.   Neurological:  Negative for dizziness, weakness and headaches.  Hematological: Negative.   Psychiatric/Behavioral:  Negative for dysphoric mood. The  patient is not nervous/anxious.     Current Outpatient Medications on File Prior to Visit  Medication Sig Dispense Refill   acetaminophen  (TYLENOL ) 650 MG CR tablet Take 650 mg by mouth every 8 (eight) hours as needed for pain.      ammonium lactate  (LAC-HYDRIN ) 12 % lotion Apply 1 Application topically 2 (two) times daily. Apply twice daily to arms and hands to increase moisture and reduce easy bruising.  Can also be used safely on neck, chest, and legs as needed 400 g 0   ascorbic acid  (VITAMIN C) 500 MG tablet Take 1 tablet (500 mg total) by mouth daily. 30 tablet 0   carvedilol  (COREG ) 12.5 MG tablet Take 12.5 mg by mouth 2 (two) times daily.     cholecalciferol  (VITAMIN D3) 25 MCG (1000 UNIT) tablet Take 1 tablet (1,000 Units total) by mouth daily. 90 tablet 1   ELIQUIS  5 MG TABS tablet Take 5 mg by mouth 2 (two) times daily.     ezetimibe  (ZETIA ) 10 MG tablet Take 1 tablet (10 mg total) by mouth daily. 90 tablet 1   furosemide  (LASIX ) 20 MG tablet Take 20 mg by mouth 2 (two) times daily.     meclizine  (ANTIVERT ) 25 MG tablet Take 25 mg by mouth every 8 (eight) hours as needed.     nitroGLYCERIN  (NITROSTAT ) 0.4 MG SL tablet Place 1 tablet (0.4 mg total) under the tongue every 5 (five) minutes as needed for chest pain. 25 tablet 2   pantoprazole  (PROTONIX ) 40 MG tablet Take 1 tablet (40 mg total) by mouth daily. 100 tablet 1   potassium chloride  (KLOR-CON ) 10 MEQ tablet Take 1 tablet (10 mEq total) by mouth daily. 90 tablet 3   sertraline  (ZOLOFT ) 50 MG tablet TAKE ONE TABLET BY MOUTH ONCE DAILY 90 tablet 1   vitamin B-12 (CYANOCOBALAMIN ) 500 MCG tablet Take 500 mcg by mouth daily.     zinc  sulfate 220 (50 Zn) MG capsule Take 1 capsule (220 mg total) by mouth daily. 90 capsule 1   pregabalin  (LYRICA ) 75 MG capsule Take 1 capsule (75 mg total) by mouth 2 (two) times daily. 90 capsule 0   rosuvastatin  (CRESTOR ) 20 MG tablet Take 1 tablet (20 mg total) by mouth at bedtime. 90 tablet 3    [DISCONTINUED] QUEtiapine  (SEROQUEL ) 25 MG tablet Take 1 tablet (25 mg total) by mouth at bedtime. 15 tablet 10   No current facility-administered medications on file prior to visit.   Past Medical History:  Diagnosis Date   Acute bursitis of left shoulder 01/04/2020   Acute on chronic diastolic congestive heart failure (HCC) 11/13/2021   Last Assessment & Plan:  Formatting of this note  is different from the original. Pertinent Data:   Current medication includes: furosemide  - 20 mg lisinopriL  - 10 mg metoprolol  succinate - 25 mg.  BNP (pg/mL)  Date Value  11/15/2021 433.6 (H)   eGFR (mL/min/1.47m*2)  Date Value  11/15/2021 >60.0    BP Readings from Last 3 Encounters:  11/30/21 (!) 147/82  11/15/21 (!) 109/54  11/13/21 (!) 134/74     Acute respiratory failure with hypoxia (HCC) 03/17/2020   Angina pectoris (HCC) 10/07/2019   Arthritis    Atherosclerosis of aorta (HCC) 09/24/2019   Bilateral foot-drop 01/18/2017   BMI 27.0-27.9,adult 09/24/2019   Coronary artery disease involving native coronary artery of native heart without angina pectoris 11/01/2015   Overview:  Bypass surgery in 1996 Overview:  Overview:  Bypass surgery in 1996  Last Assessment & Plan:  Continue his current regimen.  Follow up as noted.   CVA (cerebral vascular accident) (HCC) 04/03/2016   Last Assessment & Plan:  The patient underwent extensive CVA workup.  MRI is negative.  He does have aortic stenosis by echocardiogram.  Upon review of Care everywhere he is followed by Dr. Bernie, cardiologist for this.  Carotid ultrasound negative for hemodynamically significant stenosis.  Lipid panel within normal limits.  He has not had any further episodes of dizziness or nausea vomiting.    Demand ischemia (HCC) 11/14/2021   Last Assessment & Plan:  Formatting of this note might be different from the original. Troponin flat No anginal symptoms Formatting of this note might be different from the original. Last Assessment & Plan:   Formatting of this note might be different from the original. Troponin flat No anginal symptoms   Dementia (HCC)    Depression    DNR (do not resuscitate) 04/03/2016   Overview:  I had an extensive discussion with the patient on 04/03/2016 regarding his end of life wishes.  He and his daughter agree that do not resuscitate is in keeping with his beliefs.   Dyslipidemia 11/01/2015   Last Assessment & Plan:  Formatting of this note might be different from the original. Cholesterol 136   Triglycerides  83  HDL  50.1  LDL Calculated  69  VLDL Cholesterol Cal  17   Continue statin.   Dyspnea    with activity   Essential hypertension 11/01/2015   Last Assessment & Plan:  Resume home medications.  Follow-up as noted above.   Generalized weakness 03/13/2020   GERD (gastroesophageal reflux disease)    Herpes zoster 05/19/2019   History of aortic stenosis 03/11/2020   History of CVA (cerebrovascular accident) 11/14/2021   Last Assessment & Plan:  Formatting of this note might be different from the original. Neurologically intact upon assessment -continue aspirin , statin   Hypertensive heart disease with heart failure (HCC) 11/01/2015   Last Assessment & Plan:  Resume home medications.  Follow-up as noted above.   Myocardial infarction (HCC)    approx 1994   Neuropathy    Peripheral neuropathy 11/14/2021   Last Assessment & Plan:  Formatting of this note might be different from the original. Bilateral lower extremity cool to touch with diminished sensation Patient reports findings are chronic Continue home dose Lyrica  Formatting of this note might be different from the original. Last Assessment & Plan:  Formatting of this note might be different from the original. 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   Thrombocytopenia (HCC) 03/13/2020   Trochanteric bursitis of left hip 01/19/2021   Past Surgical History:  Procedure Laterality Date   CARDIAC CATHETERIZATION     CARDIAC SURGERY     CATARACT EXTRACTION, BILATERAL     COLONOSCOPY     [facility]   COLONOSCOPY N/A 07/04/2023   Procedure: COLONOSCOPY;  Surgeon: Stacia Glendia BRAVO, MD;  Location[facility] ENDOSCOPY;  Service: Gastroenterology;  Laterality: N/A;   CORONARY ARTERY BYPASS GRAFT     CORONARY STENT INTERVENTION N/A 10/07/2019   Procedure: CORONARY STENT INTERVENTION;  Surgeon: Wonda Sharper, MD;  Location: [facility] INVASIVE CV LAB;  Service: Cardiovascular;  Laterality: N/A;   ESOPHAGOGASTRODUODENOSCOPY N/A 07/04/2023   Procedure: EGD (ESOPHAGOGASTRODUODENOSCOPY);  Surgeon: Stacia Glendia BRAVO, MD;  Location: [facility] ENDOSCOPY;  Service: Gastroenterology;  Laterality: N/A;   EYE SURGERY     HEMORROIDECTOMY     RIGHT/LEFT HEART CATH AND CORONARY/GRAFT ANGIOGRAPHY N/A 10/07/2019   Procedure: RIGHT/LEFT HEART CATH AND CORONARY/GRAFT ANGIOGRAPHY;  Surgeon: Wonda Sharper, MD;  Location: [facility] INVASIVE CV LAB;  Service: Cardiovascular;  Laterality: N/A;   TEE WITHOUT CARDIOVERSION N/A 10/27/2019   Procedure: TRANSESOPHAGEAL ECHOCARDIOGRAM (TEE);  Surgeon: Verlin Lonni BIRCH, MD;  Location: [facility] INVASIVE CV LAB;  Service: Open Heart Surgery;  Laterality: N/A;   TRANSCATHETER AORTIC VALVE REPLACEMENT, TRANSFEMORAL N/A 10/27/2019   Procedure: TRANSCATHETER AORTIC VALVE REPLACEMENT, TRANSFEMORAL;  Surgeon: Verlin Lonni BIRCH, MD;  Location: [facility] INVASIVE CV LAB;  Service: Open Heart Surgery;  Laterality: N/A;    Family History  Problem Relation Age of Onset   Cancer Mother         bone   Diabetes Mother    Kidney disease Mother    Stroke Mother    Sleep disorder Mother    Arthritis Father    Migraines Father    Hypertension Father    Sleep disorder Father    Arthritis Brother    Cancer Brother        throat   Throat cancer Daughter    Social History   Socioeconomic History   Marital status: Widowed    Spouse name: Juanita   Number of children: 2   Years of education: Not on file   Highest education level: 7th grade  Occupational History   Occupation: Retired    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  He has one son that lives with him and one daughter that lives beside of him. He is close with his family.  His sister cooks meals for him regularly.  He has friends at the cafe he visits regularly.   Social Drivers of Health  Financial Resource Strain: Low Risk  (03/07/2023)   Overall Financial Resource Strain (CARDIA)    Difficulty of Paying Living Expenses: Not hard at all  Food Insecurity: No Food Insecurity (06/18/2023)   Hunger Vital Sign    Worried About Running Out of Food in the Last Year: Never true    Ran Out of Food in the Last Year: Never true  Transportation Needs: No Transportation Needs (06/18/2023)   PRAPARE - Administrator, Civil Service (Medical): No    Lack of Transportation (Non-Medical): No  Physical Activity: Insufficiently Active (03/07/2023)   Exercise Vital Sign    Days of Exercise per Week: 2 days    Minutes of Exercise per Session: 10 min  Stress: No Stress  Concern Present (03/07/2023)   [employer] of Occupational Health - Occupational Stress Questionnaire    Feeling of Stress : Not at all  Social Connections: Moderately Isolated (03/07/2023)   Social Connection and Isolation Panel    Frequency of Communication with Friends and Family: More than three times a week    Frequency of Social Gatherings with Friends and Family: More than three times a week    Attends Religious Services: 1 to 4 times per year    Active Member of [employer] or Organizations: No    Attends Banker Meetings: Never    Marital Status: Widowed    Objective:  BP 102/74   Pulse 71   Temp 97.7 F (36.5 C) (Temporal)   Resp 16   Ht 5' 6 (1.676 m)   Wt 154 lb (69.9 kg)   SpO2 99%   BMI 24.86 kg/m      10/14/2023    2:00 PM 09/30/2023    3:25 PM 09/30/2023    3:24 PM  BP/Weight  Systolic BP 102 166 178  Diastolic BP 74 92 90  Wt. (Lbs) 154  150  BMI 24.86 kg/m2  24.21 kg/m2    Physical Exam Vitals reviewed.  Constitutional:      Appearance: Normal appearance.  Cardiovascular:     Rate and Rhythm: Normal rate. Rhythm irregular.     Heart sounds: Normal heart sounds.  Pulmonary:     Effort: Pulmonary effort is normal.     Breath sounds: Normal breath sounds.  Abdominal:     General: Bowel sounds are normal.     Palpations: Abdomen is soft.     Tenderness: There is no abdominal tenderness.  Neurological:     Mental Status: He is alert and oriented to person, place, and time.     Motor: Weakness present.  Psychiatric:        Mood and Affect: Mood normal.        Behavior: Behavior normal.         Lab Results  Component Value Date   WBC 15.0 (H) 10/14/2023   HGB 11.1 (L) 10/14/2023   HCT 35.3 (L) 10/14/2023   PLT 325 10/14/2023   GLUCOSE 100 (H) 09/30/2023   CHOL 106 05/08/2023   TRIG 112 05/08/2023   HDL 27 (L) 05/08/2023   LDLCALC 58 05/08/2023   ALT 10 09/30/2023   AST 26 09/30/2023   NA 141 09/30/2023   K 4.1 09/30/2023    CL 108 09/30/2023   CREATININE 1.18 09/30/2023   BUN 22 09/30/2023   CO2 19 (L) 09/30/2023   INR 1.4 (H) 03/29/2023   HGBA1C 5.3 05/08/2023      Assessment & Plan:  Rib contusion, right, sequela  Assessment & Plan: Rib contusion due to falls. CT showed no fractures. Pain managed with tramadol . - Encourage use of incentive spirometer.  Orders: -     traMADol  HCl; Take 1 tablet (50 mg total) by mouth 2 (two) times daily as needed.  Dispense: 60 tablet; Refill: 0 -     CBC with Differential/Platelet  Congestive heart failure, unspecified HF chronicity, unspecified heart failure type (HCC) Assessment & Plan: Elevated pro BNP over 9000, indicating worsening heart failure. Symptoms include fluctuating blood pressure and possible fluid retention. - Follow up with cardiology on Wednesday. - Redo BMP.  Orders: -     Pro b natriuretic peptide (BNP) -     Ambulatory referral to Home Health  Pneumonia due to COVID-19 virus Assessment & Plan: Recent exacerbation with cough and shortness of breath. Inhaler not refilled. - Send prescription for albuterol  inhaler. - Encourage use of inhaler twice daily.  Orders: -     Albuterol  Sulfate HFA; Inhale 2 puffs into the lungs every 2 (two) hours as needed for wheezing or shortness of breath.  Dispense: 6.7 g; Refill: 6  History of stomach ulcers Assessment & Plan: Two small ulcers identified, likely contributing to iron deficiency anemia.   Iron deficiency anemia due to chronic blood loss Assessment & Plan: Secondary to stomach ulcers. Slightly elevated white count attributed to anemia of chronic disease. - Continue monitoring lab values with hematology oncology.   Chronic idiopathic constipation Assessment & Plan: Intermittent constipation with straining. Fiber supplement provides some relief. - Encourage regular use of fiber supplement. - Increase fluid intake.    General Health Maintenance Advised to increase fluid intake to  prevent dehydration and support health. - Encourage increased fluid intake.  Follow-up Multiple follow-up appointments scheduled with specialists. - Attend cardiology appointment on Wednesday. - [facility]end [facility] appointment on [facility]. - Follow up with neurologist in November.      Meds ordered this encounter  Medications   albuterol  (VENTOLIN  HFA) 108 (90 Base) MCG/ACT inhaler    Sig: Inhale 2 puffs into the lungs every 2 (two) hours as needed for wheezing or shortness of breath.    Dispense:  6.7 g    Refill:  6   traMADol  (ULTRAM ) 50 MG tablet    Sig: Take 1 tablet (50 mg total) by mouth 2 (two) times daily as needed.    Dispense:  60 tablet    Refill:  0    Orders Placed This Encounter  Procedures   CBC with Differential/Platelet   Pro b natriuretic peptide (BNP)   Ambulatory referral to Home Health     Follow-up: No follow-ups on file.   I,Angela Taylor,acting as a Neurosurgeon for [employer], PA.,have documented all relevant documentation on the behalf of Nola Angles, PA,as directed by  Nola Angles, PA while in the presence of Nola Angles, [place].   An After Visit Summary was printed and given to the patient.  Nola Angles, [place] [facility] [phone]

## 2023-10-15 LAB — CBC WITH DIFFERENTIAL/PLATELET
Basophils Absolute: 0.2 x10E3/uL (ref 0.0–0.2)
Basos: 1 %
EOS (ABSOLUTE): 0.7 x10E3/uL — ABNORMAL HIGH (ref 0.0–0.4)
Eos: 4 %
Hematocrit: 35.3 % — ABNORMAL LOW (ref 37.5–51.0)
Hemoglobin: 11.1 g/dL — ABNORMAL LOW (ref 13.0–17.7)
Immature Grans (Abs): 0.2 x10E3/uL — ABNORMAL HIGH (ref 0.0–0.1)
Immature Granulocytes: 2 %
Lymphocytes Absolute: 1.3 x10E3/uL (ref 0.7–3.1)
Lymphs: 9 %
MCH: 29.1 pg (ref 26.6–33.0)
MCHC: 31.4 g/dL — ABNORMAL LOW (ref 31.5–35.7)
MCV: 93 fL (ref 79–97)
Monocytes Absolute: 0.6 x10E3/uL (ref 0.1–0.9)
Monocytes: 4 %
Neutrophils Absolute: 12.1 x10E3/uL — ABNORMAL HIGH (ref 1.4–7.0)
Neutrophils: 80 %
Platelets: 325 x10E3/uL (ref 150–450)
RBC: 3.81 x10E6/uL — ABNORMAL LOW (ref 4.14–5.80)
RDW: 16.9 % — ABNORMAL HIGH (ref 11.6–15.4)
WBC: 15 x10E3/uL — ABNORMAL HIGH (ref 3.4–10.8)

## 2023-10-15 LAB — PRO B NATRIURETIC PEPTIDE: NT-Pro BNP: 11631 pg/mL — ABNORMAL HIGH (ref 0–486)

## 2023-10-16 ENCOUNTER — Telehealth: Payer: Self-pay

## 2023-10-16 ENCOUNTER — Encounter: Payer: Self-pay | Admitting: Cardiology

## 2023-10-16 ENCOUNTER — Ambulatory Visit: Attending: Cardiology | Admitting: Cardiology

## 2023-10-16 ENCOUNTER — Encounter: Payer: Self-pay | Admitting: Physician Assistant

## 2023-10-16 VITALS — BP 126/71 | HR 56 | Ht 66.0 in | Wt 162.0 lb

## 2023-10-16 DIAGNOSIS — I5033 Acute on chronic diastolic (congestive) heart failure: Secondary | ICD-10-CM | POA: Diagnosis not present

## 2023-10-16 DIAGNOSIS — E785 Hyperlipidemia, unspecified: Secondary | ICD-10-CM

## 2023-10-16 DIAGNOSIS — I4819 Other persistent atrial fibrillation: Secondary | ICD-10-CM | POA: Diagnosis not present

## 2023-10-16 DIAGNOSIS — I509 Heart failure, unspecified: Secondary | ICD-10-CM | POA: Insufficient documentation

## 2023-10-16 DIAGNOSIS — I2510 Atherosclerotic heart disease of native coronary artery without angina pectoris: Secondary | ICD-10-CM | POA: Diagnosis not present

## 2023-10-16 DIAGNOSIS — S20211S Contusion of right front wall of thorax, sequela: Secondary | ICD-10-CM

## 2023-10-16 DIAGNOSIS — Z8711 Personal history of peptic ulcer disease: Secondary | ICD-10-CM

## 2023-10-16 HISTORY — DX: Contusion of right front wall of thorax, sequela: S20.211S

## 2023-10-16 HISTORY — DX: Heart failure, unspecified: I50.9

## 2023-10-16 HISTORY — DX: Personal history of peptic ulcer disease: Z87.11

## 2023-10-16 MED ORDER — MECLIZINE HCL 50 MG PO TABS: 50.0000 mg | ORAL_TABLET | Freq: Three times a day (TID) | ORAL | 2 refills | Status: DC | PRN

## 2023-10-16 NOTE — Patient Instructions (Signed)
Medication Instructions:  ?Your physician recommends that you continue on your current medications as directed. Please refer to the Current Medication list given to you today.  ?*If you need a refill on your cardiac medications before your next appointment, please call your pharmacy* ? ? ?Lab Work: ?None Ordered ?If you have labs (blood work) drawn today and your tests are completely normal, you will receive your results only by: ?MyChart Message (if you have MyChart) OR ?A paper copy in the mail ?If you have any lab test that is abnormal or we need to change your treatment, we will call you to review the results. ? ? ?Testing/Procedures: ?None Ordered ? ? ?Follow-Up: ?At [facility], you and your health needs are our priority.  As part of our continuing mission to provide you with exceptional heart care, we have created designated Provider Care Teams.  These Care Teams include your primary Cardiologist (physician) and Advanced Practice Providers (APPs -  Physician Assistants and Nurse Practitioners) who all work together to provide you with the care you need, when you need it. ? ?We recommend signing up for the patient portal called "MyChart".  Sign up information is provided on this After Visit Summary.  MyChart is used to connect with patients for Virtual Visits (Telemedicine).  Patients are able to view lab/test results, encounter notes, upcoming appointments, etc.  Non-urgent messages can be sent to your provider as well.   ?To learn more about what you can do with MyChart, go to [email or web address].   ? ?Your next appointment:   ?1 month(s) ? ?The format for your next appointment:   ?In Person ? ?Provider:   ?Robert Krasowski, MD  ? ? ?Other Instructions ?NA  ?

## 2023-10-16 NOTE — Patient Instructions (Signed)
 VISIT SUMMARY:  During your visit, we discussed your persistent dizziness, balance issues, and recent falls. We also reviewed your ongoing health concerns, including heart failure, rib contusion, stomach ulcers, iron deficiency anemia, COPD, and constipation. We have updated your treatment plan and scheduled follow-up appointments with specialists to address these issues.  YOUR PLAN:  -DIZZINESS AND FALLS: Your dizziness and balance issues may be related to underlying conditions. We will continue your current medication, meclizine , and start home physical therapy to help improve your balance.  -CONGESTIVE HEART FAILURE: Congestive heart failure means your heart is not pumping blood as well as it should. Your elevated pro BNP levels indicate worsening heart failure. You should follow up with cardiology on Wednesday, redo your basic metabolic panel (BMP), and consider an echocardiogram to assess your heart function.  -RIB CONTUSION: A rib contusion is a bruise on your rib caused by trauma. Your CT scan showed no fractures. Continue using the incentive spirometer to aid your breathing and manage pain with tramadol  as needed.  -STOMACH ULCERS: Stomach ulcers are sores in the lining of your stomach. They are likely contributing to your iron deficiency anemia. We will continue to monitor your lab values with hematology oncology.  -IRON DEFICIENCY ANEMIA: Iron deficiency anemia means you have a lower than normal number of red blood cells due to a lack of iron. This is secondary to your stomach ulcers. We will continue to monitor your lab values with hematology oncology.  -CHRONIC OBSTRUCTIVE PULMONARY DISEASE (COPD): COPD is a chronic lung disease that makes it hard to breathe. You have had a recent exacerbation with cough and shortness of breath. We will send a prescription for an albuterol  inhaler and encourage you to use it twice daily.  -CONSTIPATION: Constipation means you have infrequent or  difficult bowel movements. Continue using your fiber supplement regularly and increase your fluid intake to help manage this issue.  -GENERAL HEALTH MAINTENANCE: To support your overall health, increase your fluid intake to prevent dehydration.  INSTRUCTIONS:  Please attend your cardiology appointment on Wednesday, your [facility] appointment on Friday, and follow up with the neurologist in November.

## 2023-10-16 NOTE — Assessment & Plan Note (Signed)
 Intermittent constipation with straining. Fiber supplement provides some relief. - Encourage regular use of fiber supplement. - Increase fluid intake.

## 2023-10-16 NOTE — Progress Notes (Unsigned)
 Cardiology Office Note:    Date:  10/16/2023   ID:  Jack Hodges, DOB 06-12-1930, MRN [id number]  PCP:  [name], PA  Cardiologist:  [name], MD    Referring MD: [name], [place]   Chief Complaint  Patient presents with   Follow-up    Increased falls    History of Present Illness:    Jack Hodges is a 89 y.o. male    with past medical history significant for coronary artery disease, in 1996 he required coronary artery bypass graft.  Also in summer 2020 when he got cardiac catheterization done he underwent PTCA and stenting of left anterior descending artery in view of the fact that he got completely occluded LIMA going to LAD.  That cardiac catheterization was done in preparation for TAVI that eventually ended up having done on 10/27/2019.  He received Edwards sapiens 3 26 mm valve.  He also got history of bilateral foot drop, essential hypertension, dyslipidemia.   Recently in the being in the hospital because of fall that he sustained overall he said his balance became much worse and he had multiple falls while in the hospital he was found to be in atrial fibrillation.  Appropriate medication has been given including anticoagulation he comes today to follow-up he looks much worse than before.  He walks around with a walker.  He is supposed to have carpal tunnel syndrome done on the right side and honestly I have a little reservation to pursue it at this stage right now.  He denies have any chest pain tightness squeezing pressure burning chest no palpitations  Past Medical History:  Diagnosis Date   Acute bursitis of left shoulder 01/04/2020   Acute on chronic diastolic congestive heart failure (HCC) 11/13/2021   Last Assessment & Plan:  Formatting of this note is different from the original. Pertinent Data:   Current medication includes: furosemide  - 20 mg lisinopriL  - 10 mg metoprolol  succinate - 25 mg.  BNP (pg/mL)  Date Value  11/15/2021 433.6 (H)   eGFR (mL/min/1.59m*2)   Date Value  11/15/2021 >60.0    BP Readings from Last 3 Encounters:  11/30/21 (!) 147/82  11/15/21 (!) 109/54  11/13/21 (!) 134/74     Acute respiratory failure with hypoxia (HCC) 03/17/2020   Angina pectoris (HCC) 10/07/2019   Arthritis    Atherosclerosis of aorta (HCC) 09/24/2019   Bilateral foot-drop 01/18/2017   BMI 27.0-27.9,adult 09/24/2019   Coronary artery disease involving native coronary artery of native heart without angina pectoris 11/01/2015   Overview:  Bypass surgery in 1996 Overview:  Overview:  Bypass surgery in 1996  Last Assessment & Plan:  Continue his current regimen.  Follow up as noted.   CVA (cerebral vascular accident) (HCC) 04/03/2016   Last Assessment & Plan:  The patient underwent extensive CVA workup.  MRI is negative.  He does have aortic stenosis by echocardiogram.  Upon review of Care everywhere he is followed by Dr. Fitch, cardiologist for this.  Carotid ultrasound negative for hemodynamically significant stenosis.  Lipid panel within normal limits.  He has not had any further episodes of dizziness or nausea vomiting.    Demand ischemia (HCC) 11/14/2021   Last Assessment & Plan:  Formatting of this note might be different from the original. Troponin flat No anginal symptoms Formatting of this note might be different from the original. Last Assessment & Plan:  Formatting of this note might be different from the original. Troponin flat No anginal symptoms  Dementia [facility])    Depression    DNR (do not resuscitate) 04/03/2016   Overview:  I had an extensive discussion with the patient on 04/03/2016 regarding his end of life wishes.  He and his daughter agree that do not resuscitate is in keeping with his beliefs.   Dyslipidemia 11/01/2015   Last Assessment & Plan:  Formatting of this note might be different from the original. Cholesterol 136   Triglycerides  83  HDL  50.1  LDL Calculated  69  VLDL Cholesterol Cal  17   Continue statin.   Dyspnea    with activity    Essential hypertension 11/01/2015   Last Assessment & Plan:  Resume home medications.  Follow-up as noted above.   Generalized weakness 03/13/2020   GERD (gastroesophageal reflux disease)    Herpes zoster 05/19/2019   History of aortic stenosis 03/11/2020   History of CVA (cerebrovascular accident) 11/14/2021   Last Assessment & Plan:  Formatting of this note might be different from the original. Neurologically intact upon assessment -continue aspirin , statin   Hypertensive heart disease with heart failure (HCC) 11/01/2015   Last Assessment & Plan:  Resume home medications.  Follow-up as noted above.   Myocardial infarction (HCC)    approx 1994   Neuropathy    Peripheral neuropathy 11/14/2021   Last Assessment & Plan:  Formatting of this note might be different from the original. Bilateral lower extremity cool to touch with diminished sensation Patient reports findings are chronic Continue home dose Lyrica  Formatting of this note might be different from the original. Last Assessment & Plan:  Formatting of this note might be different from the original. 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   Thrombocytopenia (HCC) 03/13/2020   Trochanteric bursitis of left hip 01/19/2021    Past Surgical History:  Procedure Laterality Date   CARDIAC CATHETERIZATION     CARDIAC SURGERY     CATARACT EXTRACTION, BILATERAL     COLONOSCOPY     [facility]   COLONOSCOPY N/A 07/04/2023   Procedure: COLONOSCOPY;  Surgeon: Stacia Glendia BRAVO, MD;   Location: [facility] ENDOSCOPY;  Service: Gastroenterology;  Laterality: N/A;   CORONARY ARTERY BYPASS GRAFT     CORONARY STENT INTERVENTION N/A 10/07/2019   Procedure: CORONARY STENT INTERVENTION;  Surgeon: Wonda Sharper, MD;  Location: [facility] INVASIVE CV LAB;  Service: Cardiovascular;  Laterality: N/A;   ESOPHAGOGASTRODUODENOSCOPY N/A 07/04/2023   Procedure: EGD (ESOPHAGOGASTRODUODENOSCOPY);  Surgeon: Stacia Glendia BRAVO, MD;  Location: [facility] ENDOSCOPY;  Service: Gastroenterology;  Laterality: N/A;   EYE SURGERY     HEMORROIDECTOMY     RIGHT/LEFT HEART CATH AND CORONARY/GRAFT ANGIOGRAPHY N/A 10/07/2019   Procedure: RIGHT/LEFT HEART CATH AND CORONARY/GRAFT ANGIOGRAPHY;  Surgeon: Wonda Sharper, MD;  Location: [facility] INVASIVE CV LAB;  Service: Cardiovascular;  Laterality: N/A;   TEE WITHOUT CARDIOVERSION N/A 10/27/2019   Procedure: TRANSESOPHAGEAL ECHOCARDIOGRAM (TEE);  Surgeon: Verlin Lonni BIRCH, MD;  Location: [facility] INVASIVE CV LAB;  Service: Open Heart Surgery;  Laterality: N/A;   TRANSCATHETER AORTIC VALVE REPLACEMENT, TRANSFEMORAL N/A 10/27/2019  Procedure: TRANSCATHETER AORTIC VALVE REPLACEMENT, TRANSFEMORAL;  Surgeon: Verlin Lonni BIRCH, MD;  Location: [facility] INVASIVE CV LAB;  Service: Open Heart Surgery;  Laterality: N/A;    Current Medications: Current Meds  Medication Sig   acetaminophen  (TYLENOL ) 650 MG CR tablet Take 650 mg by mouth every 8 (eight) hours as needed for pain.    albuterol  (VENTOLIN  HFA) 108 (90 Base) MCG/ACT inhaler Inhale 2 puffs into the lungs every 2 (two) hours as needed for wheezing or shortness of breath.   ammonium lactate  (LAC-HYDRIN ) 12 % lotion Apply 1 Application topically 2 (two) times daily. Apply twice daily to arms and hands to increase moisture and reduce easy bruising.  Can also be used safely on neck, chest, and legs as needed   ascorbic acid  (VITAMIN C) 500 MG tablet Take 1 tablet (500 mg total) by mouth daily.   carvedilol  (COREG ) 12.5 MG tablet Take 12.5 mg  by mouth 2 (two) times daily.   cholecalciferol  (VITAMIN D3) 25 MCG (1000 UNIT) tablet Take 1 tablet (1,000 Units total) by mouth daily.   ELIQUIS  5 MG TABS tablet Take 5 mg by mouth 2 (two) times daily.   ezetimibe  (ZETIA ) 10 MG tablet Take 1 tablet (10 mg total) by mouth daily.   furosemide  (LASIX ) 20 MG tablet Take 20 mg by mouth 2 (two) times daily.   nitroGLYCERIN  (NITROSTAT ) 0.4 MG SL tablet Place 1 tablet (0.4 mg total) under the tongue every 5 (five) minutes as needed for chest pain.   pantoprazole  (PROTONIX ) 40 MG tablet Take 1 tablet (40 mg total) by mouth daily.   potassium chloride  (KLOR-CON ) 10 MEQ tablet Take 1 tablet (10 mEq total) by mouth daily.   pregabalin  (LYRICA ) 75 MG capsule Take 1 capsule (75 mg total) by mouth 2 (two) times daily.   rosuvastatin  (CRESTOR ) 20 MG tablet Take 1 tablet (20 mg total) by mouth at bedtime.   sertraline  (ZOLOFT ) 50 MG tablet TAKE ONE TABLET BY MOUTH ONCE DAILY   traMADol  (ULTRAM ) 50 MG tablet Take 1 tablet (50 mg total) by mouth 2 (two) times daily as needed.   vitamin B-12 (CYANOCOBALAMIN ) 500 MCG tablet Take 500 mcg by mouth daily.   zinc  sulfate 220 (50 Zn) MG capsule Take 1 capsule (220 mg total) by mouth daily.   [DISCONTINUED] meclizine  (ANTIVERT ) 25 MG tablet Take 25 mg by mouth every 8 (eight) hours as needed.     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  He has one son that lives with him and one daughter that lives beside of him. He is close with his family.  His sister cooks  meals for him regularly.  He has friends at the cafe he visits regularly.   Social Drivers of Corporate investment banker Strain: Low Risk  (03/07/2023)   Overall Financial Resource Strain (CARDIA)    Difficulty of Paying Living Expenses: Not hard at all  Food Insecurity: No Food Insecurity (06/18/2023)   Hunger Vital Sign    Worried About Running Out of Food in the Last Year: Never true    Ran Out of Food in the Last Year: Never true  Transportation Needs: No Transportation Needs (06/18/2023)   PRAPARE - Administrator, Civil Service (Medical): No    Lack of Transportation (Non-Medical): No  Physical Activity: Insufficiently Active (03/07/2023)   Exercise Vital Sign    Days of Exercise per Week: 2 days    Minutes of Exercise per Session: 10 min  Stress: No Stress Concern Present (03/07/2023)   [employer] of Occupational Health - Occupational Stress Questionnaire    Feeling of Stress : Not at all  Social Connections: Moderately Isolated (03/07/2023)   Social Connection and Isolation Panel    Frequency of Communication with Friends and Family: More than three times a week    Frequency of Social Gatherings with Friends and Family: More than three times a week    Attends Religious Services: 1 to 4 times per year    Active Member of [employer] or Organizations: No    Attends Banker Meetings: Never    Marital Status: Widowed     Family History: The patient's family history includes Arthritis in his brother and father; Cancer in his brother and mother; Diabetes in his mother; Hypertension in his father; Kidney disease in his mother; Migraines in his father; Sleep disorder in his father and mother;  Stroke in his mother; Throat cancer in his daughter. ROS:   Please see the history of present illness.    All 14 point review of systems negative except as described per history of present illness  EKGs/Labs/Other Studies Reviewed:    EKG Interpretation Date/Time:  Wednesday October 16 2023 14:27:42 EDT Ventricular Rate:  80 PR Interval:    QRS Duration:  136 QT Interval:  418 QTC Calculation: 482 R Axis:   250  Text Interpretation: Atrial fibrillation Right bundle branch block Inferior infarct (cited on or before 30-Nov-2022) Anteroseptal infarct (cited on or before 30-Nov-2022) Abnormal ECG When compared with ECG of 29-Mar-2023 16:48, No significant change was found Confirmed by Bernie Charleston [phone]) on 10/16/2023 5:15:40 PM    Recent Labs: 09/30/2023: ALT 10; BUN 22; Creatinine 1.18; Potassium 4.1; Sodium 141 10/14/2023: Hemoglobin 11.1; NT-Pro BNP 11,631; Platelets 325  Recent Lipid Panel    Component Value Date/Time   CHOL 106 05/08/2023 1044   TRIG 112 05/08/2023 1044   HDL 27 (L) 05/08/2023 1044   CHOLHDL 3.9 05/08/2023 1044   LDLCALC 58 05/08/2023 1044    Physical Exam:    VS:  BP 126/71 (BP Location: Right Arm, Patient Position: Sitting)   Pulse (!) 56   Ht 5' 6 (1.676 m)   Wt 162 lb (73.5 kg)   SpO2 93%   BMI 26.15 kg/m     Wt Readings from Last 3 Encounters:  10/16/23 162 lb (73.5 kg)  10/14/23 154 lb (69.9 kg)  09/30/23 150 lb (68 kg)     GEN:  Well nourished, well developed in no acute distress HEENT: Normal NECK: No JVD; No carotid bruits LYMPHATICS: No lymphadenopathy CARDIAC: RRR, no murmurs,  no rubs, no gallops RESPIRATORY:  Clear to auscultation without rales, wheezing or rhonchi  ABDOMEN: Soft, non-tender, non-distended MUSCULOSKELETAL:  No edema; No deformity  SKIN: Warm and dry LOWER EXTREMITIES: no swelling NEUROLOGIC:  Alert and oriented x 3 PSYCHIATRIC:  Normal affect   ASSESSMENT:    1. Acute on chronic diastolic congestive heart  failure (HCC)   2. Coronary artery disease involving native coronary artery of native heart without angina pectoris   3. Dyslipidemia   4. Persistent atrial fibrillation (HCC)    PLAN:    In order of problems listed above:  Congestive heart failure seems to be compensated on physical exam we will continue present management. Atrial fibrillation rate controlled continue anticoagulation.  See him back in about a month to decide about course of action afraid we probably will continue conservative approach. Dyslipidemia, I did review K PN which show me data from February with LDL 58 HDL 27 we will continue present management.   Medication Adjustments/Labs and Tests Ordered: Current medicines are reviewed at length with the patient today.  Concerns regarding medicines are outlined above.  Orders Placed This Encounter  Procedures   EKG 12-Lead   Medication changes: No orders of the defined types were placed in this encounter.   Signed, Lamar DOROTHA Fitch, MD, [facility] 10/16/2023 5:16 PM    [facility]

## 2023-10-16 NOTE — Assessment & Plan Note (Signed)
 Rib contusion due to falls. CT showed no fractures. Pain managed with tramadol . - Encourage use of incentive spirometer.

## 2023-10-16 NOTE — Assessment & Plan Note (Signed)
 Two small ulcers identified, likely contributing to iron deficiency anemia.

## 2023-10-16 NOTE — Telephone Encounter (Signed)
 Copied from CRM [id number]. Topic: Clinical - Prescription Issue >> Oct 16, 2023  9:36 AM Zebedee SAUNDERS wrote: Reason for CRM: Pt's son [place], [place] ph: [phone] calling on behalf of pt regarding meclizine  (ANTIVERT ) 25 MG tablet. Pt was told PCP would increase dosage. Please call pt or son to confirm and pt needs a refill sent to [facility] [address] [place] -  [address] [place] [address] Phone: [phone] Fax: [phone].

## 2023-10-16 NOTE — Assessment & Plan Note (Signed)
 Recent exacerbation with cough and shortness of breath. Inhaler not refilled. - Send prescription for albuterol  inhaler. - Encourage use of inhaler twice daily.

## 2023-10-16 NOTE — Assessment & Plan Note (Signed)
 Secondary to stomach ulcers. Slightly elevated white count attributed to anemia of chronic disease. - Continue monitoring lab values with hematology oncology.

## 2023-10-16 NOTE — Assessment & Plan Note (Signed)
 Elevated pro BNP over 9000, indicating worsening heart failure. Symptoms include fluctuating blood pressure and possible fluid retention. - Follow up with cardiology on Wednesday. - Redo BMP.

## 2023-10-17 ENCOUNTER — Ambulatory Visit: Payer: Self-pay | Admitting: Physician Assistant

## 2023-10-18 ENCOUNTER — Encounter: Payer: Self-pay | Admitting: Oncology

## 2023-10-21 ENCOUNTER — Telehealth: Payer: Self-pay

## 2023-10-21 ENCOUNTER — Other Ambulatory Visit: Payer: Self-pay | Admitting: Physician Assistant

## 2023-10-21 DIAGNOSIS — G5603 Carpal tunnel syndrome, bilateral upper limbs: Secondary | ICD-10-CM

## 2023-10-21 NOTE — Telephone Encounter (Signed)
 Copied from CRM [id number]. Topic: General - Call Back - No Documentation >> Oct 21, 2023  2:41 PM Selinda RAMAN wrote: Reason for CRM: Jerel the son of the patient called stating he has been having bouts of dementia as well as he has fallen a few times. He was seen in the ER a few weeks back. The son denied triage and scheduling an appointment stating he just wants his provider to know. If there are any questions please contact Jerel to discuss further.

## 2023-10-21 NOTE — Telephone Encounter (Unsigned)
 Copied from CRM [id number]. Topic: Clinical - Medication Refill >> Oct 21, 2023  2:38 PM Selinda RAMAN wrote: Medication: pregabalin  (LYRICA ) 75 MG capsule  Has the patient contacted their pharmacy? No   This is the patient's preferred pharmacy:  [facility] - [place] - [place], [place] - [address] [place] [address] Phone: [phone] Fax: [phone]   Is this the correct pharmacy for this prescription? Yes If no, delete pharmacy and type the correct one.   Has the prescription been filled recently? No  Is the patient out of the medication? Yes he just ran out  Has the patient been seen for an appointment in the last year OR does the patient have an upcoming appointment? Yes  Can we respond through MyChart? No he would prefer a call or text  Please assist patient further

## 2023-10-24 ENCOUNTER — Ambulatory Visit (INDEPENDENT_AMBULATORY_CARE_PROVIDER_SITE_OTHER)
Admission: RE | Admit: 2023-10-24 | Discharge: 2023-10-24 | Disposition: A | Discharge status: Home / Self Care | Source: Ambulatory Visit | Attending: Neurology | Admitting: Neurology

## 2023-10-24 DIAGNOSIS — M5440 Lumbago with sciatica, unspecified side: Secondary | ICD-10-CM | POA: Diagnosis not present

## 2023-10-24 DIAGNOSIS — R269 Unspecified abnormalities of gait and mobility: Secondary | ICD-10-CM

## 2023-10-24 DIAGNOSIS — M47816 Spondylosis without myelopathy or radiculopathy, lumbar region: Secondary | ICD-10-CM | POA: Diagnosis not present

## 2023-10-24 DIAGNOSIS — M51369 Other intervertebral disc degeneration, lumbar region without mention of lumbar back pain or lower extremity pain: Secondary | ICD-10-CM | POA: Diagnosis not present

## 2023-10-24 DIAGNOSIS — G8929 Other chronic pain: Secondary | ICD-10-CM

## 2023-10-24 DIAGNOSIS — M5127 Other intervertebral disc displacement, lumbosacral region: Secondary | ICD-10-CM | POA: Diagnosis not present

## 2023-10-24 DIAGNOSIS — M48061 Spinal stenosis, lumbar region without neurogenic claudication: Secondary | ICD-10-CM | POA: Diagnosis not present

## 2023-10-27 ENCOUNTER — Encounter: Payer: Self-pay | Admitting: Physician Assistant

## 2023-10-28 ENCOUNTER — Other Ambulatory Visit: Payer: Self-pay | Admitting: Family Medicine

## 2023-10-30 ENCOUNTER — Ambulatory Visit: Payer: Self-pay | Admitting: Neurology

## 2023-10-31 ENCOUNTER — Ambulatory Visit: Payer: Self-pay

## 2023-10-31 ENCOUNTER — Telehealth: Payer: Self-pay

## 2023-10-31 NOTE — Telephone Encounter (Signed)
 FYI Only or Action Required?: Action required by provider: request for appointment.  Patient was last seen in primary care on 10/14/2023 by [name], PA.  Called Nurse Triage reporting Ankle Injury.  Symptoms began several days ago.  Interventions attempted: Rest, hydration, or home remedies.  Symptoms are: unchanged. Pt. Fell at home Monday. Injured ankle and finger.  Triage Disposition: See Physician Within 24 Hours  Patient/caregiver understands and will follow disposition?: Yes    Copied from CRM #[id number]. Topic: Clinical - Red Word Triage >> Oct 31, 2023 12:18 PM Shardie S wrote: [facility] that prompted transfer to Nurse Triage: Pt fell,  ankle pain/ swelling- RT Reason for Disposition  [1] High-risk adult (e.g., age > 60 years, osteoporosis, chronic steroid use) AND [2] limping  Answer Assessment - Initial Assessment Questions 1. MECHANISM: How did the injury happen? (e.g., twisting injury, direct blow)      Fell at home 2. ONSET: When did the injury happen? (Minutes or hours ago)      Monday 3. LOCATION: Where is the injury located?      Right ankle 4. APPEARANCE of INJURY: What does the injury look like?      Bruised, swollen 5. WEIGHT-BEARING: Can you put weight on that foot? Can you walk (four steps or more)?       no 6. SIZE: For cuts, bruises, or swelling, ask: How large is it? (e.g., inches or centimeters;  entire joint)      no 7. PAIN: Is there pain? If Yes, ask: How bad is the pain?    (e.g., Scale 1-10; or mild, moderate, severe)   - NONE (0): no pain.   - MILD (1-3): doesn't interfere with normal activities.    - MODERATE (4-7): interferes with normal activities (e.g., work or school) or awakens from sleep, limping.    - SEVERE (8-10): excruciating pain, unable to do any normal activities, unable to walk.      moderate 8. TETANUS: For any breaks in the skin, ask: When was the last tetanus booster?     N/a 9. OTHER SYMPTOMS: Do you  have any other symptoms?      no 10. PREGNANCY: Is there any chance you are pregnant? When was your last menstrual period?       N/a  Protocols used: Ankle and Foot Injury-A-AH

## 2023-10-31 NOTE — Progress Notes (Unsigned)
 Acute Office Visit  Subjective:    Patient ID: Jack Hodges, male    DOB: 01/01/1931, 89 y.o.   MRN: [id number]  Chief Complaint  Patient presents with   Ankle Injury    HPI: Patient is in today for recent fall. Ankle pain, swollen finger  Would like wheel chair  Discussed the use of AI scribe software for clinical note transcription with the patient, who gave verbal consent to proceed.  History of Present Illness   Jack Hodges is a 89 year old male with congestive heart failure who presents with an ankle injury and swelling. He is accompanied by his daughter and son.  He sustained an ankle injury four days ago after rolling his ankle while walking at home. He experiences significant pain when moving the ankle and persistent swelling since the fall. The pain is located on both the top and bottom of the foot, and he is unable to move his toes without discomfort. No numbness is reported, but severe pain is present. He has not had any prior similar injuries.  He reports swelling in both feet. He is currently taking furosemide  once daily in the morning. He avoids taking it at night to prevent frequent urination that disrupts his sleep. No chest pain is reported, but he experiences shortness of breath when lying down.  Additionally, he has a swollen and painful middle finger on his right hand, which he noticed after it 'exploded' following a previous injury. He is unsure if it is related to gout, as he has no known history of gout. He can move the finger, but it is painful when pressed.  His daughter reports increased shaking and unsteadiness, requiring assistance from both her and his son to move around the house. He has a history of chronic iron deficiency anemia. He has experienced weight fluctuations, possibly due to fluid retention, and has been using protein shakes for nutritional supplementation.       Past Medical History:  Diagnosis Date   Acute bursitis of left shoulder  01/04/2020   Acute on chronic diastolic congestive heart failure (HCC) 11/13/2021   Last Assessment & Plan:  Formatting of this note is different from the original. Pertinent Data:   Current medication includes: furosemide  - 20 mg lisinopriL  - 10 mg metoprolol  succinate - 25 mg.  BNP (pg/mL)  Date Value  11/15/2021 433.6 (H)   eGFR (mL/min/1.51m*2)  Date Value  11/15/2021 >60.0    BP Readings from Last 3 Encounters:  11/30/21 (!) 147/82  11/15/21 (!) 109/54  11/13/21 (!) 134/74     Acute respiratory failure with hypoxia (HCC) 03/17/2020   Angina pectoris (HCC) 10/07/2019   Arthritis    Atherosclerosis of aorta (HCC) 09/24/2019   Bilateral foot-drop 01/18/2017   BMI 27.0-27.9,adult 09/24/2019   Coronary artery disease involving native coronary artery of native heart without angina pectoris 11/01/2015   Overview:  Bypass surgery in 1996 Overview:  Overview:  Bypass surgery in 1996  Last Assessment & Plan:  Continue his current regimen.  Follow up as noted.   CVA (cerebral vascular accident) (HCC) 04/03/2016   Last Assessment & Plan:  The patient underwent extensive CVA workup.  MRI is negative.  He does have aortic stenosis by echocardiogram.  Upon review of Care everywhere he is followed by Dr. Bernie, cardiologist for this.  Carotid ultrasound negative for hemodynamically significant stenosis.  Lipid panel within normal limits.  He has not had any further episodes of dizziness or nausea vomiting.  Demand ischemia (HCC) 11/14/2021   Last Assessment & Plan:  Formatting of this note might be different from the original. Troponin flat No anginal symptoms Formatting of this note might be different from the original. Last Assessment & Plan:  Formatting of this note might be different from the original. Troponin flat No anginal symptoms   Dementia (HCC)    Depression    DNR (do not resuscitate) 04/03/2016   Overview:  I had an extensive discussion with the patient on 04/03/2016 regarding his end of  life wishes.  He and his daughter agree that do not resuscitate is in keeping with his beliefs.   Dyslipidemia 11/01/2015   Last Assessment & Plan:  Formatting of this note might be different from the original. Cholesterol 136   Triglycerides  83  HDL  50.1  LDL Calculated  69  VLDL Cholesterol Cal  17   Continue statin.   Dyspnea    with activity   Essential hypertension 11/01/2015   Last Assessment & Plan:  Resume home medications.  Follow-up as noted above.   Generalized weakness 03/13/2020   GERD (gastroesophageal reflux disease)    Herpes zoster 05/19/2019   History of aortic stenosis 03/11/2020   History of CVA (cerebrovascular accident) 11/14/2021   Last Assessment & Plan:  Formatting of this note might be different from the original. Neurologically intact upon assessment -continue aspirin , statin   Hypertensive heart disease with heart failure (HCC) 11/01/2015   Last Assessment & Plan:  Resume home medications.  Follow-up as noted above.   Myocardial infarction (HCC)    approx 1994   Neuropathy    Peripheral neuropathy 11/14/2021   Last Assessment & Plan:  Formatting of this note might be different from the original. Bilateral lower extremity cool to touch with diminished sensation Patient reports findings are chronic Continue home dose Lyrica  Formatting of this note might be different from the original. Last Assessment & Plan:  Formatting of this note might be different from the original. 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    Thrombocytopenia (HCC) 03/13/2020   Trochanteric bursitis of left hip 01/19/2021    Past Surgical History:  Procedure Laterality Date   CARDIAC CATHETERIZATION     CARDIAC SURGERY     CATARACT EXTRACTION, BILATERAL     COLONOSCOPY     [facility]   COLONOSCOPY N/A 07/04/2023   Procedure: COLONOSCOPY;  Surgeon: Stacia Glendia BRAVO, MD;  Location: [facility] ENDOSCOPY;  Service: Gastroenterology;  Laterality: N/A;   CORONARY ARTERY BYPASS GRAFT     CORONARY STENT INTERVENTION N/A 10/07/2019   Procedure: CORONARY STENT INTERVENTION;  Surgeon: Wonda Sharper, MD;  Location: [facility] INVASIVE CV LAB;  Service: Cardiovascular;  Laterality: N/A;   ESOPHAGOGASTRODUODENOSCOPY N/A 07/04/2023   Procedure: EGD (ESOPHAGOGASTRODUODENOSCOPY);  Surgeon: Stacia Glendia BRAVO, MD;  Location: [facility] ENDOSCOPY;  Service: Gastroenterology;  Laterality: N/A;   EYE SURGERY     HEMORROIDECTOMY     RIGHT/LEFT HEART CATH AND CORONARY/GRAFT ANGIOGRAPHY N/A 10/07/2019   Procedure: RIGHT/LEFT HEART CATH AND CORONARY/GRAFT ANGIOGRAPHY;  Surgeon: Wonda Sharper, MD;  Location: [facility] INVASIVE CV LAB;  Service: Cardiovascular;  Laterality: N/A;   TEE WITHOUT CARDIOVERSION N/A 10/27/2019   Procedure: TRANSESOPHAGEAL ECHOCARDIOGRAM (TEE);  Surgeon: Verlin Lonni BIRCH, MD;  Location: [facility] INVASIVE CV LAB;  Service: Open Heart Surgery;  Laterality: N/A;   TRANSCATHETER AORTIC VALVE REPLACEMENT, TRANSFEMORAL N/A 10/27/2019   Procedure: TRANSCATHETER AORTIC VALVE REPLACEMENT, TRANSFEMORAL;  Surgeon: Verlin Lonni BIRCH, MD;  Location: [facility] INVASIVE CV LAB;  Service: Open Heart Surgery;  Laterality: N/A;    Family History  Problem Relation Age of Onset   Cancer Mother        bone   Diabetes Mother    Kidney disease Mother    Stroke Mother    Sleep disorder Mother    Arthritis Father    Migraines Father    Hypertension Father    Sleep disorder Father    Arthritis Brother    Cancer Brother        throat   Throat cancer  Daughter     Social History   Socioeconomic History   Marital status: Widowed    Spouse name: Juanita   Number of children: 2   Years of education: Not on file   Highest education level: 7th grade  Occupational History   Occupation: Retired    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  He has one son that lives with him and one daughter that lives beside of him. He is close with his family.  His sister cooks meals for him regularly.  He has friends at the cafe he visits regularly.   Social Drivers of Corporate investment banker Strain: Low Risk  (03/07/2023)   Overall Financial Resource Strain (CARDIA)    Difficulty of Paying Living Expenses: Not hard at all  Food Insecurity: No Food Insecurity (11/06/2023)   Hunger Vital Sign    Worried About Running Out of Food in the Last Year: Never true    Ran Out of Food in the Last Year: Never true  Transportation Needs: No Transportation Needs (11/06/2023)   PRAPARE - Administrator, Civil Service (Medical): No    Lack of Transportation (Non-Medical): No  Physical Activity: Insufficiently Active (03/07/2023)   Exercise Vital Sign    Days of Exercise per Week: 2 days    Minutes of Exercise per Session: 10 min  Stress: No Stress Concern Present (03/07/2023)   [employer] of Occupational Health - Occupational Stress Questionnaire    Feeling of Stress : Not at all  Social Connections: Moderately Isolated (03/07/2023)   Social Connection and Isolation Panel    Frequency of  Communication with Friends and Family: More than three times a week    Frequency of Social Gatherings with Friends and Family: More than three times a week    Attends Religious Services: 1 to 4 times per year    Active Member of Clubs or Organizations: No  Attends Banker Meetings: Never    Marital Status: Widowed  Intimate Partner Violence: Not At Risk (11/06/2023)   Humiliation, Afraid, Rape, and Kick questionnaire    Fear of Current or Ex-Partner: No    Emotionally Abused: No    Physically Abused: No    Sexually Abused: No    Outpatient Medications Prior to Visit  Medication Sig Dispense Refill   acetaminophen  (TYLENOL ) 650 MG CR tablet Take 650 mg by mouth every 8 (eight) hours as needed for pain.      albuterol  (VENTOLIN  HFA) 108 (90 Base) MCG/ACT inhaler Inhale 2 puffs into the lungs every 2 (two) hours as needed for wheezing or shortness of breath. 6.7 g 6   ammonium lactate  (LAC-HYDRIN ) 12 % lotion Apply 1 Application topically 2 (two) times daily. Apply twice daily to arms and hands to increase moisture and reduce easy bruising.  Can also be used safely on neck, chest, and legs as needed 400 g 0   ascorbic acid  (VITAMIN C) 500 MG tablet Take 1 tablet (500 mg total) by mouth daily. 30 tablet 0   carvedilol  (COREG ) 12.5 MG tablet Take 12.5 mg by mouth 2 (two) times daily.     cholecalciferol  (VITAMIN D3) 25 MCG (1000 UNIT) tablet Take 1 tablet (1,000 Units total) by mouth daily. 90 tablet 1   ELIQUIS  5 MG TABS tablet Take 5 mg by mouth 2 (two) times daily.     ezetimibe  (ZETIA ) 10 MG tablet Take 1 tablet (10 mg total) by mouth daily. 90 tablet 1   furosemide  (LASIX ) 20 MG tablet Take 20 mg by mouth 2 (two) times daily.     meclizine  (ANTIVERT ) 50 MG tablet Take 1 tablet (50 mg total) by mouth 3 (three) times daily as needed. 30 tablet 2   nitroGLYCERIN  (NITROSTAT ) 0.4 MG SL tablet Place 1 tablet (0.4 mg total) under the tongue every 5 (five) minutes as needed for chest  pain. 25 tablet 2   pantoprazole  (PROTONIX ) 40 MG tablet Take 1 tablet (40 mg total) by mouth daily. 100 tablet 1   potassium chloride  (KLOR-CON ) 10 MEQ tablet Take 1 tablet (10 mEq total) by mouth daily. 90 tablet 3   pregabalin  (LYRICA ) 75 MG capsule TAKE ONE CAPSULE BY MOUTH TWICE DAILY 90 capsule 0   rosuvastatin  (CRESTOR ) 20 MG tablet Take 1 tablet (20 mg total) by mouth at bedtime. 90 tablet 3   sertraline  (ZOLOFT ) 50 MG tablet TAKE ONE TABLET BY MOUTH ONCE DAILY 90 tablet 1   traMADol  (ULTRAM ) 50 MG tablet Take 1 tablet (50 mg total) by mouth 2 (two) times daily as needed. 60 tablet 0   vitamin B-12 (CYANOCOBALAMIN ) 500 MCG tablet Take 500 mcg by mouth daily.     zinc  sulfate 220 (50 Zn) MG capsule Take 1 capsule (220 mg total) by mouth daily. 90 capsule 1   No facility-administered medications prior to visit.    Allergies  Allergen Reactions   Poison Oak Extract Rash    Review of Systems  Constitutional:  Negative for appetite change, fatigue and fever.  HENT:  Negative for congestion, ear pain, sinus pressure and sore throat.   Eyes: Negative.   Respiratory:  Negative for cough, chest tightness, shortness of breath and wheezing.   Cardiovascular:  Negative for chest pain and palpitations.  Gastrointestinal:  Negative for abdominal pain, constipation, diarrhea, nausea and vomiting.  Endocrine: Negative.   Genitourinary:  Negative for dysuria and hematuria.  Musculoskeletal:  Negative for  arthralgias, back pain, joint swelling and myalgias.  Skin:  Negative for rash.  Allergic/Immunologic: Negative.   Neurological:  Negative for dizziness, weakness, light-headedness and headaches.  Hematological: Negative.   Psychiatric/Behavioral:  Negative for dysphoric mood. The patient is not nervous/anxious.        Objective:        11/06/2023    3:45 PM 11/01/2023    8:11 AM 10/16/2023    2:22 PM  Vitals with BMI  Height  5' 6 5' 6  Weight  163 lbs 162 lbs  BMI  26.32 26.16   Systolic 146 124 873  Diastolic 63 72 71  Pulse 88 88 56    No data found.   Physical Exam Vitals reviewed.  Constitutional:      Appearance: He is underweight.  Cardiovascular:     Rate and Rhythm: Normal rate. Rhythm irregular.     Heart sounds: Normal heart sounds.  Pulmonary:     Effort: Pulmonary effort is normal.     Breath sounds: Normal breath sounds.  Abdominal:     General: Bowel sounds are normal.     Palpations: Abdomen is soft.     Tenderness: There is no abdominal tenderness.  Musculoskeletal:     Right lower leg: 2+ Edema present.     Left lower leg: 2+ Pitting Edema present.  Skin:    Findings: Erythema and wound present.         Comments: Denies acute injury.  Neurological:     Mental Status: He is oriented to person, place, and time. He is lethargic.  Psychiatric:        Mood and Affect: Mood normal.        Behavior: Behavior normal.      Media Information  Document Information  Photos    11/01/2023 08:36  Attached To:  Office Visit on 11/01/23 with [name], PA  Source Information  [name], [place]  [facility]   Health Maintenance Due  Topic Date Due   INFLUENZA VACCINE  10/25/2023    There are no preventive care reminders to display for this patient.   No results found for: TSH Lab Results  Component Value Date   WBC 17.7 (H) 11/01/2023   HGB 11.4 (L) 11/01/2023   HCT 36.1 (L) 11/01/2023   MCV 94 11/01/2023   PLT 271 11/01/2023   Lab Results  Component Value Date   NA 144 11/01/2023   K 3.5 11/01/2023   CO2 18 (L) 11/01/2023   GLUCOSE 140 (H) 11/01/2023   BUN 19 11/01/2023   CREATININE 1.11 11/01/2023   BILITOT 1.5 (H) 11/01/2023   ALKPHOS 162 (H) 11/01/2023   AST 20 11/01/2023   ALT 9 11/01/2023   PROT 5.9 (L) 11/01/2023   ALBUMIN 4.0 11/01/2023   CALCIUM  9.1 11/01/2023   ANIONGAP 14 09/30/2023   EGFR 63 11/01/2023   Lab Results  Component Value Date   CHOL 106 05/08/2023   Lab Results   Component Value Date   HDL 27 (L) 05/08/2023   Lab Results  Component Value Date   LDLCALC 58 05/08/2023   Lab Results  Component Value Date   TRIG 112 05/08/2023   Lab Results  Component Value Date   CHOLHDL 3.9 05/08/2023   Lab Results  Component Value Date   HGBA1C 5.3 05/08/2023       Assessment & Plan:  Recurrent falls Assessment & Plan: Tremor with gait instability, under neurologic evaluation Tremor and gait  instability under evaluation by neurology. Family history of Parkinson's disease noted. - Document increased tremor and gait instability for neurologist review. - Continue neurologic evaluation and follow-up.  Orders: -     DG Ankle Complete Right; Future -     DG Hand Complete Right; Future -     AMB Referral [facility]BCI Care Management  Inflammation of right hand joint Assessment & Plan: Swelling and wound on right middle finger joint, possibly due to gout or infection. Differential includes gout flare or joint infection. - Swab finger wound for culture. - Order x-ray of right hand to assess for joint damage or gout. - Order blood tests including uric acid level to evaluate for gout. - Start prophylactic antibiotics to prevent infection.  Orders: -     CBC with Differential/Platelet -     Comprehensive metabolic panel with GFR -     Sedimentation rate -     C-reactive protein -     Iron, TIBC and Ferritin Panel -     Uric acid -     Anaerobic and Aerobic Culture -     AMB Referral VBCI Care Management -     Amoxicillin -Pot Clavulanate; Take 1 tablet by mouth 2 (two) times daily.  Dispense: 14 tablet; Refill: 0  Hypertensive heart disease with heart failure (HCC) Assessment & Plan: Chronic congestive heart failure with significant lower extremity edema. Symptoms include orthopnea. Current medication includes furosemide  once daily. - Increase furosemide  to twice daily to manage edema. - Monitor for improvement in edema and symptoms.  Orders: -     AMB  Referral [facility]BCI Care Management  Moderate dementia, unspecified dementia type, unspecified whether behavioral, psychotic, or mood disturbance or anxiety (HCC) -     AMB Referral [facility]BCI Care Management  Acute right ankle pain Assessment & Plan: Acute right ankle injury with significant swelling and pain. Differential includes soft tissue injury.  - Order x-ray of right ankle to assess for injury.   Anemia of chronic disease Assessment & Plan: Chronic anemia likely secondary to chronic disease, including congestive heart failure and chronic kidney disease. Hematology follow-up scheduled for November. - Order blood tests to assess iron levels and anemia status. - Consider iron infusion if anemia is severe.    Goals of Care Discussion of care management and potential need for assisted living due to increased care needs and mobility issues. - Contact home health to ensure services are initiated. - Engage care management team to explore assisted living options.        Meds ordered this encounter  Medications   amoxicillin -clavulanate (AUGMENTIN ) 875-125 MG tablet    Sig: Take 1 tablet by mouth 2 (two) times daily.    Dispense:  14 tablet    Refill:  0    Orders Placed This Encounter  Procedures   Anaerobic and Aerobic Culture   DG Ankle Complete Right   DG Hand Complete Right   CBC with Differential/Platelet   Comprehensive metabolic panel with GFR   Sedimentation rate   C-reactive protein   Iron, TIBC and Ferritin Panel   Uric acid   AMB Referral VBCI Care Management     Follow-up: Return in about 6 weeks (around 12/13/2023) for Chronic, Nola.  An After Visit Summary was printed and given to the patient.  I,Lauren M Auman,acting as a Neurosurgeon for [employer], PA.,have documented all relevant documentation on the behalf of Nola Angles, PA,as directed by  Nola Angles, PA while in the presence of [address]  Andreea Arca, PA.    Nola Angles, [place] [facility] [phone]

## 2023-10-31 NOTE — Telephone Encounter (Signed)
 Walker order faxed to [facility]

## 2023-11-01 ENCOUNTER — Encounter: Payer: Self-pay | Admitting: Physician Assistant

## 2023-11-01 ENCOUNTER — Ambulatory Visit (INDEPENDENT_AMBULATORY_CARE_PROVIDER_SITE_OTHER)
Admission: RE | Admit: 2023-11-01 | Discharge: 2023-11-01 | Disposition: A | Discharge status: Home / Self Care | Source: Ambulatory Visit | Attending: Physician Assistant | Admitting: Physician Assistant

## 2023-11-01 ENCOUNTER — Ambulatory Visit (INDEPENDENT_AMBULATORY_CARE_PROVIDER_SITE_OTHER): Admitting: Physician Assistant

## 2023-11-01 VITALS — BP 124/72 | HR 88 | Temp 97.8°F | Resp 16 | Ht 66.0 in | Wt 163.0 lb

## 2023-11-01 DIAGNOSIS — D638 Anemia in other chronic diseases classified elsewhere: Secondary | ICD-10-CM | POA: Diagnosis not present

## 2023-11-01 DIAGNOSIS — F03B0 Unspecified dementia, moderate, without behavioral disturbance, psychotic disturbance, mood disturbance, and anxiety: Secondary | ICD-10-CM | POA: Diagnosis not present

## 2023-11-01 DIAGNOSIS — I110 Hypertensive heart disease with heart failure: Secondary | ICD-10-CM

## 2023-11-01 DIAGNOSIS — M19041 Primary osteoarthritis, right hand: Secondary | ICD-10-CM | POA: Diagnosis not present

## 2023-11-01 DIAGNOSIS — R296 Repeated falls: Secondary | ICD-10-CM | POA: Diagnosis not present

## 2023-11-01 DIAGNOSIS — M1811 Unilateral primary osteoarthritis of first carpometacarpal joint, right hand: Secondary | ICD-10-CM | POA: Diagnosis not present

## 2023-11-01 DIAGNOSIS — M25571 Pain in right ankle and joints of right foot: Secondary | ICD-10-CM

## 2023-11-01 DIAGNOSIS — W19XXXA Unspecified fall, initial encounter: Secondary | ICD-10-CM

## 2023-11-01 DIAGNOSIS — M7989 Other specified soft tissue disorders: Secondary | ICD-10-CM | POA: Diagnosis not present

## 2023-11-01 DIAGNOSIS — Z043 Encounter for examination and observation following other accident: Secondary | ICD-10-CM | POA: Diagnosis not present

## 2023-11-01 MED ORDER — AMOXICILLIN-POT CLAVULANATE 875-125 MG PO TABS: 1.0000 | ORAL_TABLET | Freq: Two times a day (BID) | ORAL | 0 refills | Status: DC

## 2023-11-01 NOTE — Patient Instructions (Signed)
 VISIT SUMMARY:  You visited us  today due to an ankle injury and swelling, along with other health concerns including swelling in your feet, a painful finger, and increased unsteadiness. We discussed your current medications and symptoms, and we have outlined a plan to address each of these issues.  YOUR PLAN:  -RIGHT ANKLE INJURY WITH SWELLING AND PAIN: You have an acute injury to your right ankle with significant swelling and pain, likely due to a soft tissue injury. We will get an x-ray of your right ankle to check for any injury.  -RIGHT MIDDLE FINGER JOINT SWELLING WITH POSSIBLE GOUT AND WOUND: Your right middle finger is swollen and painful, possibly due to gout or an infection. We will swab the wound for culture, get an x-ray of your right hand, and order blood tests to check for gout. We will also start you on antibiotics to prevent infection.  -CONGESTIVE HEART FAILURE WITH LOWER EXTREMITY EDEMA: You have chronic heart failure, which is causing swelling in your lower legs. We will increase your furosemide  to twice daily to help manage the swelling and monitor for improvement.  -CHRONIC ANEMIA OF CHRONIC DISEASE: You have chronic anemia, likely due to your heart and kidney conditions. We will order blood tests to check your iron levels and anemia status, and consider an iron infusion if needed.  -CHRONIC KIDNEY DISEASE, UNSPECIFIED STAGE: You have chronic kidney disease, which is contributing to your anemia and fluid retention. We will monitor your kidney function through blood tests.  -TREMOR WITH GAIT INSTABILITY, UNDER NEUROLOGIC EVALUATION: You have tremors and difficulty walking, which are being evaluated by a neurologist. We will document the increased tremor and instability for your neurologist to review and continue with your neurologic evaluation.  -GOALS OF CARE: We discussed your care management and the potential need for assisted living due to your increased care needs and  mobility issues. We will contact home health services and engage a care management team to explore assisted living options.  INSTRUCTIONS:  Please follow up with the recommended x-rays and blood tests. Continue taking your medications as prescribed, and monitor for any changes in your symptoms. We will also be in touch to discuss the results and next steps. Your hematology follow-up is scheduled for November.

## 2023-11-02 LAB — C-REACTIVE PROTEIN: CRP: 44 mg/L — ABNORMAL HIGH (ref 0–10)

## 2023-11-02 LAB — CBC WITH DIFFERENTIAL/PLATELET
Basophils Absolute: 0.1 x10E3/uL (ref 0.0–0.2)
Basos: 1 %
EOS (ABSOLUTE): 0.3 x10E3/uL (ref 0.0–0.4)
Eos: 2 %
Hematocrit: 36.1 % — ABNORMAL LOW (ref 37.5–51.0)
Hemoglobin: 11.4 g/dL — ABNORMAL LOW (ref 13.0–17.7)
Immature Grans (Abs): 0.2 x10E3/uL — ABNORMAL HIGH (ref 0.0–0.1)
Immature Granulocytes: 1 %
Lymphocytes Absolute: 1 x10E3/uL (ref 0.7–3.1)
Lymphs: 6 %
MCH: 29.8 pg (ref 26.6–33.0)
MCHC: 31.6 g/dL (ref 31.5–35.7)
MCV: 94 fL (ref 79–97)
Monocytes Absolute: 0.8 x10E3/uL (ref 0.1–0.9)
Monocytes: 5 %
Neutrophils Absolute: 15.2 x10E3/uL — ABNORMAL HIGH (ref 1.4–7.0)
Neutrophils: 85 %
Platelets: 271 x10E3/uL (ref 150–450)
RBC: 3.83 x10E6/uL — ABNORMAL LOW (ref 4.14–5.80)
RDW: 16.9 % — ABNORMAL HIGH (ref 11.6–15.4)
WBC: 17.7 x10E3/uL — ABNORMAL HIGH (ref 3.4–10.8)

## 2023-11-02 LAB — COMPREHENSIVE METABOLIC PANEL WITH GFR
ALT: 9 IU/L (ref 0–44)
AST: 20 IU/L (ref 0–40)
Albumin: 4 g/dL (ref 3.6–4.6)
Alkaline Phosphatase: 162 IU/L — ABNORMAL HIGH (ref 44–121)
BUN/Creatinine Ratio: 17 (ref 10–24)
BUN: 19 mg/dL (ref 10–36)
Bilirubin Total: 1.5 mg/dL — ABNORMAL HIGH (ref 0.0–1.2)
CO2: 18 mmol/L — ABNORMAL LOW (ref 20–29)
Calcium: 9.1 mg/dL (ref 8.6–10.2)
Chloride: 109 mmol/L — ABNORMAL HIGH (ref 96–106)
Creatinine, Ser: 1.11 mg/dL (ref 0.76–1.27)
Globulin, Total: 1.9 g/dL (ref 1.5–4.5)
Glucose: 140 mg/dL — ABNORMAL HIGH (ref 70–99)
Potassium: 3.5 mmol/L (ref 3.5–5.2)
Sodium: 144 mmol/L (ref 134–144)
Total Protein: 5.9 g/dL — ABNORMAL LOW (ref 6.0–8.5)
eGFR: 63 mL/min/1.73 (ref >59)

## 2023-11-02 LAB — IRON,TIBC AND FERRITIN PANEL
Ferritin: 431 ng/mL — ABNORMAL HIGH (ref 30–400)
Iron Saturation: 10 % — ABNORMAL LOW (ref 15–55)
Iron: 24 ug/dL — ABNORMAL LOW (ref 38–169)
Total Iron Binding Capacity: 239 ug/dL — ABNORMAL LOW (ref 250–450)
UIBC: 215 ug/dL (ref 111–343)

## 2023-11-02 LAB — SEDIMENTATION RATE: Sed Rate: 2 mm/h (ref 0–30)

## 2023-11-02 LAB — URIC ACID: Uric Acid: 10.5 mg/dL — ABNORMAL HIGH (ref 3.8–8.4)

## 2023-11-04 ENCOUNTER — Telehealth: Payer: Self-pay

## 2023-11-04 ENCOUNTER — Ambulatory Visit: Admitting: Physician Assistant

## 2023-11-04 NOTE — Progress Notes (Signed)
 Complex Care Management Note Care Guide Note  11/04/2023 Name: Jack Hodges MRN: [id number] DOB: 02-22-1931   Complex Care Management Outreach Attempts: An unsuccessful telephone outreach was attempted today to offer the patient information about available complex care management services.  Follow Up Plan:  Additional outreach attempts will be made to offer the patient complex care management information and services.   Encounter Outcome:  No Answer  D[facility]  [facility] Care Management Assistant Direct Dial: [phone]  Fax: [phone]

## 2023-11-04 NOTE — Progress Notes (Unsigned)
 Complex Care Management Note Care Guide Note  11/04/2023 Name: Jack Hodges MRN: [id number] DOB: 02-22-1931   Complex Care Management Outreach Attempts: An unsuccessful telephone outreach was attempted today to offer the patient information about available complex care management services.  Follow Up Plan:  Additional outreach attempts will be made to offer the patient complex care management information and services.   Encounter Outcome:  No Answer  D[facility]  [facility] Care Management Assistant Direct Dial: [phone]  Fax: [phone]

## 2023-11-05 ENCOUNTER — Ambulatory Visit: Payer: Self-pay | Admitting: Family Medicine

## 2023-11-05 NOTE — Progress Notes (Signed)
 Complex Care Management Note  Care Guide Note 11/05/2023 Name: Jack Hodges MRN: [id number] DOB: 1930/08/31  [name] is a 89 y.o. year old male who sees [place], [place], [place] for primary care. I reached out to Coady LELON Seip by phone today to offer complex care management services.  [name]   The Complex Care Management services include support from the care team which includes your Nurse Care Manager, Clinical Social Worker, or Pharmacist.  The Complex Care Management team is here to help remove barriers to the health concerns and goals most important to you. Complex Care Management services are voluntary, and the patient may decline or stop services at any time by request to their care team member.   Complex Care Management Consent Status: Patient agreed to services and verbal consent obtained.   Follow up plan:  Telephone appointment with complex care management team member scheduled for:  11/06/23 & 11/11/23.  Encounter Outcome:  Patient Scheduled  Dream[facility]  [facility], [facility] Care Management Assistant Direct Dial: [phone]  Fax: [phone]

## 2023-11-06 ENCOUNTER — Other Ambulatory Visit: Payer: Self-pay

## 2023-11-06 MED ORDER — COLCHICINE 0.6 MG PO TABS: 0.6000 mg | ORAL_TABLET | Freq: Two times a day (BID) | ORAL | 0 refills | Status: DC

## 2023-11-06 NOTE — Telephone Encounter (Signed)
 Called patient's son and I gave him the results.  Copied from CRM [id number]. Topic: Clinical - Lab/Test Results >> Nov 05, 2023  2:37 PM Zebedee SAUNDERS wrote: Reason for CRM: Pt's son Lathan, Gieselman [phone] calling on behalf of pt. He would like a call back regarding DG Hand Complete Right (Accession [id number]) (Order [id number]) and DG Ankle Complete Right (Accession [id number]) (Order [id number]) to discuss in detail.

## 2023-11-06 NOTE — Patient Outreach (Signed)
 Complex Care Management   Visit Note  11/06/2023  Name:  Jack Hodges MRN: [id number] DOB: 1930-05-02  Situation: Referral received for Complex Care Management related to explore assisted living options. Patient Heart Failure, Dementia, and HTN and mobility issues I obtained verbal consent from Jerel Seip, Son(dpr/caregiver).  Visit completed with Jerel Seip on the phone  Background:   Past Medical History:  Diagnosis Date   Acute bursitis of left shoulder 01/04/2020   Acute on chronic diastolic congestive heart failure (HCC) 11/13/2021   Last Assessment & Plan:  Formatting of this note is different from the original. Pertinent Data:   Current medication includes: furosemide  - 20 mg lisinopriL  - 10 mg metoprolol  succinate - 25 mg.  BNP (pg/mL)  Date Value  11/15/2021 433.6 (H)   eGFR (mL/min/1.35m*2)  Date Value  11/15/2021 >60.0    BP Readings from Last 3 Encounters:  11/30/21 (!) 147/82  11/15/21 (!) 109/54  11/13/21 (!) 134/74     Acute respiratory failure with hypoxia (HCC) 03/17/2020   Angina pectoris (HCC) 10/07/2019   Arthritis    Atherosclerosis of aorta (HCC) 09/24/2019   Bilateral foot-drop 01/18/2017   BMI 27.0-27.9,adult 09/24/2019   Coronary artery disease involving native coronary artery of native heart without angina pectoris 11/01/2015   Overview:  Bypass surgery in 1996 Overview:  Overview:  Bypass surgery in 1996  Last Assessment & Plan:  Continue his current regimen.  Follow up as noted.   CVA (cerebral vascular accident) (HCC) 04/03/2016   Last Assessment & Plan:  The patient underwent extensive CVA workup.  MRI is negative.  He does have aortic stenosis by echocardiogram.  Upon review of Care everywhere he is followed by Dr. Bernie, cardiologist for this.  Carotid ultrasound negative for hemodynamically significant stenosis.  Lipid panel within normal limits.  He has not had any further episodes of dizziness or nausea vomiting.    Demand ischemia (HCC) 11/14/2021    Last Assessment & Plan:  Formatting of this note might be different from the original. Troponin flat No anginal symptoms Formatting of this note might be different from the original. Last Assessment & Plan:  Formatting of this note might be different from the original. Troponin flat No anginal symptoms   Dementia (HCC)    Depression    DNR (do not resuscitate) 04/03/2016   Overview:  I had an extensive discussion with the patient on 04/03/2016 regarding his end of life wishes.  He and his daughter agree that do not resuscitate is in keeping with his beliefs.   Dyslipidemia 11/01/2015   Last Assessment & Plan:  Formatting of this note might be different from the original. Cholesterol 136   Triglycerides  83  HDL  50.1  LDL Calculated  69  VLDL Cholesterol Cal  17   Continue statin.   Dyspnea    with activity   Essential hypertension 11/01/2015   Last Assessment & Plan:  Resume home medications.  Follow-up as noted above.   Generalized weakness 03/13/2020   GERD (gastroesophageal reflux disease)    Herpes zoster 05/19/2019   History of aortic stenosis 03/11/2020   History of CVA (cerebrovascular accident) 11/14/2021   Last Assessment & Plan:  Formatting of this note might be different from the original. Neurologically intact upon assessment -continue aspirin , statin   Hypertensive heart disease with heart failure (HCC) 11/01/2015   Last Assessment & Plan:  Resume home medications.  Follow-up as noted above.   Myocardial infarction (HCC)  approx 1994   Neuropathy    Peripheral neuropathy 11/14/2021   Last Assessment & Plan:  Formatting of this note might be different from the original. Bilateral lower extremity cool to touch with diminished sensation Patient reports findings are chronic Continue home dose Lyrica  Formatting of this note might be different from the original. Last Assessment & Plan:  Formatting of this note might be different from the original. 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   Thrombocytopenia (HCC) 03/13/2020   Trochanteric bursitis of left hip 01/19/2021    Assessment: RNCM spoke with patient's son who reports member has a history of dementia. Reports short term memory loss. Also reports frequent falls due to Vertigo. Son states he is receptive to having assisted living options, but plans to keep patient in the home. LCSW scheduled to call 11/11/23.  Patient Reported Symptoms:  Cognitive Cognitive Status: Able to follow simple commands (history of dementia, son reports sometime forgetful and confused at times.)      Neurological Neurological Review of Symptoms: Dizziness Neurological Management Strategies: Medication therapy, Routine screening (change in position slowly) Neurological Comment: son reports history of Vertigo-takes Meclizine   HEENT HEENT Symptoms Reported: No symptoms reported      Cardiovascular Cardiovascular Symptoms Reported: Swelling in legs or feet (reports swelling lower extremity has improved.) Does patient have uncontrolled Hypertension?: No Cardiovascular Management Strategies: Medication therapy, Routine screening  Respiratory Respiratory Symptoms Reported: No symptoms reported, Productive cough Other Respiratory Symptoms: reports occasional productive cough at night and in the morning when he wakes up will cough white phlegm. reports has had since Covid 2021 and states provider aware.    Endocrine Endocrine Symptoms Reported: No symptoms reported Is patient diabetic?: No     Gastrointestinal Gastrointestinal Symptoms Reported: Constipation Additional Gastrointestinal Details: son reports patient has a bowel movement about every three days. bowel movement today Gastrointestinal Management Strategies: Medication therapy, Diet modification Gastrointestinal Self-Management Outcome: 3 (uncertain)    Genitourinary Genitourinary Symptoms Reported: No symptoms reported    Integumentary Integumentary Symptoms Reported: Other Other Integumentary Symptoms: right hand middle finger cut/swollen and gout following a fall    Musculoskeletal Musculoskelatal Symptoms Reviewed: Limited mobility, Unsteady gait, Difficulty walking, Joint pain Additional Musculoskeletal Details: gout in right ankle-son reports he is picking up colchicine  refill today, r ankle injury post fall Musculoskeletal Management Strategies: Medication therapy, Routine screening, Adequate rest Musculoskeletal Self-Management Outcome: 3 (uncertain) Falls in the past year?: Yes Number of falls in past year: 2 or more Was there an injury with Fall?: Yes Fall Risk Category Calculator: 3 Patient Fall Risk Level: High Fall Risk Patient at Risk for Falls Due to: History of fall(s), Impaired balance/gait, Impaired mobility Fall risk Follow up: Education provided, Falls prevention discussed  Psychosocial Psychosocial Symptoms Reported: No symptoms reported     Quality of Family Relationships: supportive, involved Do you feel physically threatened by others?:  (unable to ask)      09/30/2023    3:00 PM  Depression screen PHQ 2/9  Decreased Interest 0  Down, Depressed, Hopeless 0  PHQ - 2 Score 0    Vitals:   11/06/23 1545  BP: (!) 146/63  Pulse: 88    Medications Reviewed Today     Reviewed by Normal Recinos M, RN (Registered Nurse) on 11/06/23 at 1610  Med List Status: <None>   Medication Order Taking? Sig Documenting Provider Last Dose Status Informant  acetaminophen  (TYLENOL ) 650 MG CR tablet  [id number] Yes Take 650 mg by mouth every 8 (eight) hours as needed for pain.  [provider]  Active Self  albuterol  (VENTOLIN  HFA) 108 (90 Base) MCG/ACT inhaler [id number] Yes Inhale 2 puffs into the lungs every 2 (two) hours as needed for wheezing or shortness of breath. [name], PA  Active   ammonium lactate  (LAC-HYDRIN ) 12 % lotion [id number] Yes Apply 1 Application topically 2 (two) times daily. Apply twice daily to arms and hands to increase moisture and reduce easy bruising.  Can also be used safely on neck, chest, and legs as needed Cox, Kirsten, MD  Active   amoxicillin -clavulanate (AUGMENTIN ) 875-125 MG tablet [id number] Yes Take 1 tablet by mouth 2 (two) times daily. [name], PA  Active   ascorbic acid  (VITAMIN C) 500 MG tablet [id number] Yes Take 1 tablet (500 mg total) by mouth daily. Milissa Tod PARAS, MD  Active   carvedilol  (COREG ) 12.5 MG tablet [id number] Yes Take 12.5 mg by mouth 2 (two) times daily. [provider]  Active   cholecalciferol  (VITAMIN D3) 25 MCG (1000 UNIT) tablet [id number] Yes Take 1 tablet (1,000 Units total) by mouth daily. Sherre Clapper, MD  Active   colchicine  0.6 MG tablet [id number]  Take 1 tablet (0.6 mg total) by mouth 2 (two) times daily. [name], PA  Active   ELIQUIS  5 MG TABS tablet [id number] Yes Take 5 mg by mouth 2 (two) times daily. [provider]  Active   ezetimibe  (ZETIA ) 10 MG tablet [id number] Yes Take 1 tablet (10 mg total) by mouth daily. Cox, Kirsten, MD  Active   furosemide  (LASIX ) 20 MG tablet [id number] Yes Take 20 mg by mouth 2 (two) times daily. [provider]  Active   meclizine  (ANTIVERT ) 50 MG tablet [id number] Yes Take 1 tablet (50 mg total) by mouth 3 (three) times daily as needed. [name], PA  Active   nitroGLYCERIN  (NITROSTAT ) 0.4 MG SL tablet [id number] Yes Place 1 tablet (0.4 mg total) under the tongue every 5 (five) minutes as needed for chest pain. Goodrich, Callie E, PA-C  Active Self   pantoprazole  (PROTONIX ) 40 MG tablet [id number] Yes Take 1 tablet (40 mg total) by mouth daily. [name], PA  Active   potassium chloride  (KLOR-CON ) 10 MEQ tablet [id number] Yes Take 1 tablet (10 mEq total) by mouth daily. Krasowski, Robert J, MD  Active   pregabalin  (LYRICA ) 75 MG capsule [id number] Yes TAKE ONE CAPSULE BY MOUTH TWICE DAILY [name], [place]  Active      Discontinued 08/08/22 1656 (Entry Error)   rosuvastatin  (CRESTOR ) 20 MG tablet [id number] Yes Take 1 tablet (20 mg total) by mouth at bedtime. Abran Jerilynn Loving, MD  Active   sertraline  (ZOLOFT ) 50 MG tablet [id number] Yes TAKE ONE TABLET BY MOUTH ONCE DAILY [name], [place]  Active   traMADol  (ULTRAM ) 50 MG tablet [id number] Yes Take 1 tablet (50 mg total) by mouth 2 (two) times daily as needed. [name], PA  Active   vitamin B-12 (CYANOCOBALAMIN ) 500 MCG tablet [id number] Yes Take 500 mcg by mouth daily. [provider]  Active   zinc  sulfate 220 (50 Zn) MG capsule [id number] Yes Take 1 capsule (220 mg total) by mouth daily. Sherre Clapper, MD  Active           Recommendation:   PCP Follow-up Specialty provider follow-up nerve conduction study 11/13/23 Home Health requests: Physical therapy  Follow Up Plan:   Telephone follow up appointment date/time:  11/20/23 at 2:00 pm  Heddy Shutter, RN, MSN, BSN, CCM [place]  [facility], Population Health Case Manager Phone: [phone]

## 2023-11-06 NOTE — Patient Instructions (Signed)
 Visit Information  Thank you for taking time to visit with me today. Please don't hesitate to contact me if I can be of assistance to you before our next scheduled appointment.  Our next appointment is by telephone on 11/20/23 at 2:00 pm. Please call the care guide team at [phone] if you need to cancel or reschedule your appointment.   Following is a copy of your care plan:   Goals Addressed             This Visit's Progress    VBCI RN Care Plan       Problems:  Chronic Disease Management support and education needs related to CHF, Dementia, and HTN Fall Risk  Goal: Over the next 90 days the Patient will continue to work with Medical illustrator and/or Social Worker to address care management and care coordination needs related to CHF, Dementia, and HTN as evidenced by adherence to care management team scheduled appointments     verbalize basic understanding of CHF, Dementia, and HTN disease process and self health management plan as evidenced by patient/caregiver report and/or review of chart verbalize understanding of plan for management of CHF, Dementia, and HTN as evidenced by patent/caregiver report and/or review of chart Patient/caregiver will verbalize fall prevention strategies  Interventions:   Heart Failure Interventions: Basic overview and discussion of pathophysiology of Heart Failure reviewed Provided education on low sodium diet Assessed need for readable accurate scales in home Discussed importance of daily weight and advised patient to weigh and record daily Reviewed role of diuretics in prevention of fluid overload and management of heart failure; Discussed the importance of keeping all appointments with provider Assessed social determinant of health barriers  Assigned education video via email: Heart failure: full program; Heart failure: water pills and fluids; heart failure: blood pressure  Dementia/Safety/Fall Risk Interventions: Provided written and verbal  education re: potential causes of falls and Fall prevention strategies Advised patient of importance of notifying provider of falls Discussed fall prevention strategies: change position slowly, use assistive devices when up ambulating. RNCM message to provider re: HHPT referral Assigned Education video via email re: fall prevention and home safety; dementia basics: what can you expect?; managing dementia: overview; what is Dementia?overview   Hypertension Interventions: Last practice recorded BP readings:  BP Readings from Last 3 Encounters:  11/06/23 (!) 146/63  11/01/23 124/72  10/16/23 126/71   Most recent eGFR/CrCl:  Lab Results  Component Value Date   EGFR 63 11/01/2023    No components found for: CRCL  Evaluation of current treatment plan related to hypertension self management and patient's adherence to plan as established by provider Reviewed medications with patient and discussed importance of compliance Discussed plans with patient for ongoing care management follow up and provided patient with direct contact information for care management team Advised patient, providing education and rationale, to monitor blood pressure daily and record, calling PCP for findings outside established parameters Assessed social determinant of health barriers Advised to continue to monitor and record BP and notify provider if outside recommended parameters Assigned education video via email: Heart failure: Blood pressure  Patient Self-Care Activities:  Attend all scheduled provider appointments Call pharmacy for medication refills 3-7 days in advance of running out of medications Call provider office for new concerns or questions  Take medications as prescribed   Work with the social worker to address care coordination needs and will continue to work with the clinical team to address health care and disease management related needs  use salt in moderation watch for swelling in feet, ankles  and legs every day take blood pressure log to all doctor appointments keep all doctor appointments  Plan:  Telephone follow up appointment with care management team member scheduled for:  11/20/23 at 2:00 pm           Please call the Suicide and Crisis Lifeline: 988 call the [place]  National Suicide Prevention Lifeline: 986 812 7745 or TTY: 845-275-1412 TTY 249-155-2441) to talk to a trained counselor call 1-800-273-TALK (toll free, 24 hour hotline) if you are experiencing a Mental Health or Behavioral Health Crisis or need someone to talk to.  Patient verbalizes understanding of instructions and care plan provided today and agrees to view in MyChart. Active MyChart status and patient understanding of how to access instructions and care plan via MyChart confirmed with patient.     Heddy Shutter, RN, MSN, BSN, CCM [place]  [facility], Population Health Case Manager Phone: [phone]

## 2023-11-08 LAB — ANAEROBIC AND AEROBIC CULTURE

## 2023-11-10 DIAGNOSIS — D638 Anemia in other chronic diseases classified elsewhere: Secondary | ICD-10-CM

## 2023-11-10 DIAGNOSIS — M25571 Pain in right ankle and joints of right foot: Secondary | ICD-10-CM

## 2023-11-10 DIAGNOSIS — M19041 Primary osteoarthritis, right hand: Secondary | ICD-10-CM | POA: Insufficient documentation

## 2023-11-10 HISTORY — DX: Anemia in other chronic diseases classified elsewhere: D63.8

## 2023-11-10 HISTORY — DX: Pain in right ankle and joints of right foot: M25.571

## 2023-11-10 HISTORY — DX: Primary osteoarthritis, right hand: M19.041

## 2023-11-10 MED ORDER — LEVOFLOXACIN 500 MG PO TABS: 500.0000 mg | ORAL_TABLET | Freq: Every day | ORAL | 0 refills | Status: AC

## 2023-11-10 NOTE — Assessment & Plan Note (Signed)
 Chronic congestive heart failure with significant lower extremity edema. Symptoms include orthopnea. Current medication includes furosemide  once daily. - Increase furosemide  to twice daily to manage edema. - Monitor for improvement in edema and symptoms.

## 2023-11-10 NOTE — Assessment & Plan Note (Signed)
 Swelling and wound on right middle finger joint, possibly due to gout or infection. Differential includes gout flare or joint infection. - Swab finger wound for culture. - Order x-ray of right hand to assess for joint damage or gout. - Order blood tests including uric acid level to evaluate for gout. - Start prophylactic antibiotics to prevent infection.

## 2023-11-10 NOTE — Assessment & Plan Note (Signed)
 Tremor with gait instability, under neurologic evaluation Tremor and gait instability under evaluation by neurology. Family history of Parkinson's disease noted. - Document increased tremor and gait instability for neurologist review. - Continue neurologic evaluation and follow-up.

## 2023-11-10 NOTE — Assessment & Plan Note (Signed)
 Acute right ankle injury with significant swelling and pain. Differential includes soft tissue injury.  - Order x-ray of right ankle to assess for injury.

## 2023-11-10 NOTE — Assessment & Plan Note (Signed)
 Chronic anemia likely secondary to chronic disease, including congestive heart failure and chronic kidney disease. Hematology follow-up scheduled for November. - Order blood tests to assess iron levels and anemia status. - Consider iron infusion if anemia is severe.

## 2023-11-11 ENCOUNTER — Ambulatory Visit: Payer: Self-pay

## 2023-11-11 ENCOUNTER — Other Ambulatory Visit: Payer: Self-pay

## 2023-11-11 NOTE — Telephone Encounter (Signed)
 Son requesting clarification on how the provider wants the finger wrapped  FYI Only or Action Required?: Action required by provider: clinical question for provider.  Patient was last seen in primary care on 11/01/2023 by [name], PA.  Called Nurse Triage reporting Finger Injury.  Symptoms began today.  Interventions attempted: Nothing.  Symptoms are: unchanged.  Triage Disposition: Home Care  Patient/caregiver understands and will follow disposition?: No, wishes to speak with PCP  Copied from CRM #[id number]. Topic: Clinical - Red Word Triage >> Nov 11, 2023  4:23 PM Wess RAMAN wrote: Red Word that prompted transfer to Nurse Triage: Patient's son, Jerel, stated he does not know how to wrap the body tape on patient's finger. Pain level is 8. Finger is swollen due to a fall that occurred on or around 10/27/23. Red and infected. Patient is now on a 2nd antibiotic due to the 1st one not working.  Callback #: [id number] Reason for Disposition  Finger swelling, bruise or pain  Answer Assessment - Initial Assessment Questions 1. MECHANISM: How did the injury happen?      fell 2. ONSET: When did the injury happen? (e.g., minutes, hours ago)      Fall was on 8/3 4. APPEARANCE of the INJURY: What does the injury look like?      Swollen and red 9. OTHER SYMPTOMS: Do you have any other symptoms?     Denies, states that he was given a stronger abx today and the infection seems better  Pt son would like clarification on how provider would like the finger wrapped, they were advised to use body tape. Pt son states that he spoke to provider this morning and advised that the redness in the leg will go away with antibiotic.  Protocols used: Finger Injury-A-AH

## 2023-11-11 NOTE — Patient Outreach (Signed)
  LCSW called patient son's Jerel at scheduled time. LCSW discussed the reason for the referral. Jerel explained that he is not looking for placement for patient. He wants some one to assist with patient's ADLS especially when he has an appointment. LCSW discussed patient insurance benefit through [employer]. Jerel explained that he has called him and they do not do hands on care and they don't have a staff in their area. Jerel explained that he may call the back if needed.  LCSW discussed the possibly of Medicaid for patient and Jerel explains that patient has too much financial asset so he does not qualify. LCSW explored the out of pocket expense for a personal care assistant and Jerel declined to that option at this time.  LCSW inquired providing dementia care resources and Jerel declined.  It was decided that LCSW would not join care team at this time however Jerel would alert Heddy RN if a social work need arise. LCSW provided Jerel with LCSW contact information.  Olam Ally, MSW, LCSW [place]  [facility], [facility] Licensed Clinical Social Worker Direct Dial: [phone]

## 2023-11-12 ENCOUNTER — Ambulatory Visit: Payer: Self-pay

## 2023-11-12 NOTE — Telephone Encounter (Signed)
 FYI Only or Action Required?: Action required by provider: clinical question for provider.  Patient was last seen in primary care on 11/01/2023 by [name], PA.  Called Nurse Triage reporting Hand Pain.  Symptoms began several days ago.  Interventions attempted: Rest, hydration, or home remedies.  Symptoms are: unchanged.  Triage Disposition: See HCP Within 4 Hours (Or PCP Triage)-just needing a call back to clarify instructions for buddy taping patient's injured finger.   Patient/caregiver understands and will follow disposition?: No, wishes to speak with PCP  Copied from CRM #[id number]. Topic: Clinical - Red Word Triage >> Nov 11, 2023  4:23 PM Wess RAMAN wrote: Red Word that prompted transfer to Nurse Triage: Patient's son, Jerel, stated he does not know how to wrap the body tape on patient's finger. Pain level is 8. Finger is swollen due to a fall that occurred on or around 10/27/23. Red and infected. Patient is now on a 2nd antibiotic due to the 1st one not working.  Callback #: [id number] >> Nov 12, 2023 12:30 PM Winona R wrote: Pt son Jerel calling to as he has not received a call back from Triage call yesterday Reason for Disposition  [1] Looks infected (spreading redness, red streak, pus) AND [2] large red area (> 2 inches or 5 cm, or entire finger)  Answer Assessment - Initial Assessment Questions 1. ONSET: When did the pain start?      Patient fell on 8/3 2. LOCATION and RADIATION: Where is the pain located?  (e.g., fingertip, around nail, joint, entire      Right middle finger-has a hair line fracture 3. SEVERITY: How bad is the pain? What does it keep you from doing?   (Scale 1-10; or mild, moderate, severe)     Mild-moderate 4. APPEARANCE: What does the finger look like? (e.g., redness, swelling, bruising, pallor)     Red and swollen 5. WORK OR EXERCISE: Has there been any recent work or exercise that involved this part (i.e., fingers or hand) of the body?      no 6. CAUSE: What do you think is causing the pain?     Fracture to finger 7. AGGRAVATING FACTORS: What makes the pain worse? (e.g., using computer)     NA 8. OTHER SYMPTOMS: Do you have any other symptoms? (e.g., fever, neck pain, numbness)     No other symptoms  Patient's son calling again with questions of how to buddy tape patient's right middle finger. Discussed the basics of buddy taping the injured finger with either his uninjured pointer or ring finger. Son is asking if he should be taping the fingers together in a certain way. Discussed using gauze to cushion the fingers and using paper tape. Son would like a phone call back from office to clarify instructions.  Protocols used: Finger Pain-A-AH

## 2023-11-13 ENCOUNTER — Ambulatory Visit: Admitting: Neurology

## 2023-11-13 DIAGNOSIS — G8929 Other chronic pain: Secondary | ICD-10-CM

## 2023-11-13 DIAGNOSIS — M5440 Lumbago with sciatica, unspecified side: Secondary | ICD-10-CM | POA: Diagnosis not present

## 2023-11-13 DIAGNOSIS — R2689 Other abnormalities of gait and mobility: Secondary | ICD-10-CM

## 2023-11-13 DIAGNOSIS — R269 Unspecified abnormalities of gait and mobility: Secondary | ICD-10-CM

## 2023-11-13 NOTE — Telephone Encounter (Signed)
 Spoke with patient's son Jerel, verbalized understanding and had no questions at this time. Patient comes in on the 26th for a follow up.

## 2023-11-13 NOTE — Progress Notes (Unsigned)
 Chief Complaint  Patient presents with   ncs/emg    Emg rm3, son present, Pt is well and ready for ncs/emg      ASSESSMENT AND PLAN  Jack Hodges is a 89 y.o. male   History of progressive worsening ascending paresthesia, weakness, gait abnormality Acute worsening since fall of 2024  Examination showed profound distal lower extremity weakness, sensory loss, well-preserved upper extremity reflex, absent at lower extremity,  MRI of lumbar showed severe stenosis at L4-5, moderate severe canal stenosis at L3-4, EMG nerve conduction study also confirmed active bilateral lumbosacral radiculopathy, with likely superimposed peripheral neuropathy, not a good candidate for surgery due to aging, multiple comorbidity,   Refer to home health,  Prescription for wheelchair  DIAGNOSTIC DATA (LABS, IMAGING, TESTING) - I reviewed patient records, labs, notes, testing and imaging myself where available.   MEDICAL HISTORY:  Jack Hodges is a 89 year old male, accompanied by his son, seen in request by his primary care from [facility] [place], [place], for evaluation of gait abnormality, initial evaluation was on September 10, 2023  History is obtained from the patient and review of electronic medical records. I personally reviewed pertinent available imaging films in PACS.   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 ( transcather)  A fib, on Eliquis    His son lives with him, reported fairly acute worsening of functional status since fall of 2024, 1 morning he drove himself to church, woke up from a nap complains of double vision, vertigo, worsening gait abnormality since then, has quit driving, began to rely on his walker, before that, he was active in yard work, ambulate without any assistance  Patient and his son are poor historian, it is difficult to get exact timeline, he  seems to have gradual onset bilateral foot numbness weakness for few years, had bilateral AFO for couple years  He also complains of multiple joints pain, now he complains of worsening lower extremity paresthesia, began to involving bilateral fingers since 2025,  He also complains of bowel and bladder incontinence, contributing to his previous hemorrhoid surgery,  He has lack of appetite, lost 50 pounds over the last couple years  He was treated at emergency room March 29, 2023 for blurry vision, double vision  MRI of the brain January 2025, no evidence of acute intracranial abnormality,  CT angiogram head and neck: Severe stenosis of right vertebral artery inferior margin, 50% stenosis of proximal left ICA in the neck, severe left P2 PCA stenosis  UPDATE November 13 2023: He has profound lower extremity weakness, slow worsening, increased gait abnormality, need help with transfer, his son has to quit his job to take care of him full-time  EMG nerve conduction study showed active bilateral lumbosacral radiculopathy, limited study due to lower extremity edema, likely with superimposed peripheral neuropathy  MRI of lumbar spine October 29, 2023, multilevel degenerative changes, severe spondylosis with moderate to severe canal stenosis at L3-4, severe stenosis at L4-5  PHYSICAL EXAM:   Vitals:   11/13/23 1137 11/13/23 1142  BP: (!) 144/75 129/76  Pulse: 82   Resp: 16   Weight: 163 lb (73.9 kg)    Body mass index is 26.31 kg/m.  PHYSICAL EXAMNIATION:  Gen: NAD, conversant, well nourised, well groomed  Cardiovascular: Regular rate rhythm, no peripheral edema, warm, nontender. Eyes: Conjunctivae clear without exudates or hemorrhage Neck: Supple, no carotid bruits. Pulmonary: Clear to auscultation bilaterally   NEUROLOGICAL EXAM:  MENTAL STATUS: Speech/cognition: Awake, alert, oriented to history taking and casual conversation CRANIAL NERVES: CN II: Visual  fields are full to confrontation. Pupils are round equal and briskly reactive to light. CN III, IV, VI: extraocular movement are normal. No ptosis. CN V: Facial sensation is intact to light touch CN VII: Face is symmetric with normal eye closure  CN VIII: Hearing is normal to causal conversation. CN IX, X: Phonation is normal. CN XI: Head turning and shoulder shrug are intact  MOTOR: Mild bilateral intrinsic hand muscle atrophy, proximal upper extremity muscle strength was relatively preserved, mild to moderate finger abduction, grip weakness.  Mild bilateral hip flexion weakness, atrophy from bilateral knee down, only trace movement of bilateral ankle dorsiflexion and plantarflexion,  REFLEXES: Reflexes are 2+ and symmetric at the biceps, triceps, absent at knees, and ankles. Plantar responses are flexor.  SENSORY: Preserved finger vibratory sensation proprioception, length-dependent sensory changes at lower extremity to vibratory sensation, pinprick to knee level, absent toe proprioception  COORDINATION: There is no trunk or limb dysmetria noted.  GAIT/STANCE: Profound bilateral flailed foot  REVIEW OF SYSTEMS:  Full 14 system review of systems performed and notable only for as above All other review of systems were negative.   ALLERGIES: Allergies  Allergen Reactions   Poison Oak Extract Rash    HOME MEDICATIONS: Current Outpatient Medications  Medication Sig Dispense Refill   acetaminophen  (TYLENOL ) 650 MG CR tablet Take 650 mg by mouth every 8 (eight) hours as needed for pain.      albuterol  (VENTOLIN  HFA) 108 (90 Base) MCG/ACT inhaler Inhale 2 puffs into the lungs every 2 (two) hours as needed for wheezing or shortness of breath. 6.7 g 6   ammonium lactate  (LAC-HYDRIN ) 12 % lotion Apply 1 Application topically 2 (two) times daily. Apply twice daily to arms and hands to increase moisture and reduce easy bruising.  Can also be used safely on neck, chest, and legs as needed  400 g 0   amoxicillin -clavulanate (AUGMENTIN ) 875-125 MG tablet Take 1 tablet by mouth 2 (two) times daily. 14 tablet 0   ascorbic acid  (VITAMIN C) 500 MG tablet Take 1 tablet (500 mg total) by mouth daily. 30 tablet 0   carvedilol  (COREG ) 12.5 MG tablet Take 12.5 mg by mouth 2 (two) times daily.     cholecalciferol  (VITAMIN D3) 25 MCG (1000 UNIT) tablet Take 1 tablet (1,000 Units total) by mouth daily. 90 tablet 1   colchicine  0.6 MG tablet Take 1 tablet (0.6 mg total) by mouth 2 (two) times daily. 14 tablet 0   ELIQUIS  5 MG TABS tablet Take 5 mg by mouth 2 (two) times daily.     ezetimibe  (ZETIA ) 10 MG tablet Take 1 tablet (10 mg total) by mouth daily. 90 tablet 1   furosemide  (LASIX ) 20 MG tablet Take 20 mg by mouth 2 (two) times daily.     levofloxacin  (LEVAQUIN ) 500 MG tablet Take 1 tablet (500 mg total) by mouth daily for 7 days. 7 tablet 0   meclizine  (ANTIVERT ) 50 MG tablet Take 1 tablet (50 mg total) by mouth 3 (three) times daily as needed. 30 tablet 2   nitroGLYCERIN  (NITROSTAT ) 0.4 MG SL tablet Place 1 tablet (0.4 mg total) under the tongue every 5 (five) minutes as needed for chest pain. 25 tablet  2   pantoprazole  (PROTONIX ) 40 MG tablet Take 1 tablet (40 mg total) by mouth daily. 100 tablet 1   potassium chloride  (KLOR-CON ) 10 MEQ tablet Take 1 tablet (10 mEq total) by mouth daily. 90 tablet 3   pregabalin  (LYRICA ) 75 MG capsule TAKE ONE CAPSULE BY MOUTH TWICE DAILY 90 capsule 0   rosuvastatin  (CRESTOR ) 20 MG tablet Take 1 tablet (20 mg total) by mouth at bedtime. 90 tablet 3   sertraline  (ZOLOFT ) 50 MG tablet TAKE ONE TABLET BY MOUTH ONCE DAILY 90 tablet 1   traMADol  (ULTRAM ) 50 MG tablet Take 1 tablet (50 mg total) by mouth 2 (two) times daily as needed. 60 tablet 0   vitamin B-12 (CYANOCOBALAMIN ) 500 MCG tablet Take 500 mcg by mouth daily.     zinc  sulfate 220 (50 Zn) MG capsule Take 1 capsule (220 mg total) by mouth daily. 90 capsule 1   No current facility-administered  medications for this visit.    PAST MEDICAL HISTORY: Past Medical History:  Diagnosis Date   Acute bursitis of left shoulder 01/04/2020   Acute on chronic diastolic congestive heart failure (HCC) 11/13/2021   Last Assessment & Plan:  Formatting of this note is different from the original. Pertinent Data:   Current medication includes: furosemide  - 20 mg lisinopriL  - 10 mg metoprolol  succinate - 25 mg.  BNP (pg/mL)  Date Value  11/15/2021 433.6 (H)   eGFR (mL/min/1.49m*2)  Date Value  11/15/2021 >60.0    BP Readings from Last 3 Encounters:  11/30/21 (!) 147/82  11/15/21 (!) 109/54  11/13/21 (!) 134/74     Acute respiratory failure with hypoxia (HCC) 03/17/2020   Angina pectoris (HCC) 10/07/2019   Arthritis    Atherosclerosis of aorta (HCC) 09/24/2019   Bilateral foot-drop 01/18/2017   BMI 27.0-27.9,adult 09/24/2019   Coronary artery disease involving native coronary artery of native heart without angina pectoris 11/01/2015   Overview:  Bypass surgery in 1996 Overview:  Overview:  Bypass surgery in 1996  Last Assessment & Plan:  Continue his current regimen.  Follow up as noted.   CVA (cerebral vascular accident) (HCC) 04/03/2016   Last Assessment & Plan:  The patient underwent extensive CVA workup.  MRI is negative.  He does have aortic stenosis by echocardiogram.  Upon review of Care everywhere he is followed by Dr. Bernie, cardiologist for this.  Carotid ultrasound negative for hemodynamically significant stenosis.  Lipid panel within normal limits.  He has not had any further episodes of dizziness or nausea vomiting.    Demand ischemia (HCC) 11/14/2021   Last Assessment & Plan:  Formatting of this note might be different from the original. Troponin flat No anginal symptoms Formatting of this note might be different from the original. Last Assessment & Plan:  Formatting of this note might be different from the original. Troponin flat No anginal symptoms   Dementia (HCC)    Depression     DNR (do not resuscitate) 04/03/2016   Overview:  I had an extensive discussion with the patient on 04/03/2016 regarding his end of life wishes.  He and his daughter agree that do not resuscitate is in keeping with his beliefs.   Dyslipidemia 11/01/2015   Last Assessment & Plan:  Formatting of this note might be different from the original. Cholesterol 136   Triglycerides  83  HDL  50.1  LDL Calculated  69  VLDL Cholesterol Cal  17   Continue statin.   Dyspnea    with activity  Essential hypertension 11/01/2015   Last Assessment & Plan:  Resume home medications.  Follow-up as noted above.   Generalized weakness 03/13/2020   GERD (gastroesophageal reflux disease)    Herpes zoster 05/19/2019   History of aortic stenosis 03/11/2020   History of CVA (cerebrovascular accident) 11/14/2021   Last Assessment & Plan:  Formatting of this note might be different from the original. Neurologically intact upon assessment -continue aspirin , statin   Hypertensive heart disease with heart failure (HCC) 11/01/2015   Last Assessment & Plan:  Resume home medications.  Follow-up as noted above.   Myocardial infarction (HCC)    approx 1994   Neuropathy    Peripheral neuropathy 11/14/2021   Last Assessment & Plan:  Formatting of this note might be different from the original. Bilateral lower extremity cool to touch with diminished sensation Patient reports findings are chronic Continue home dose Lyrica  Formatting of this note might be different from the original. Last Assessment & Plan:  Formatting of this note might be different from the original. 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   Thrombocytopenia (HCC) 03/13/2020   Trochanteric bursitis of left hip 01/19/2021    PAST SURGICAL HISTORY: Past Surgical History:  Procedure Laterality Date   CARDIAC CATHETERIZATION     CARDIAC SURGERY     CATARACT EXTRACTION, BILATERAL     COLONOSCOPY     [facility]   COLONOSCOPY N/A 07/04/2023   Procedure: COLONOSCOPY;  Surgeon: Stacia Glendia BRAVO, MD;  Location: [facility] ENDOSCOPY;  Service: Gastroenterology;  Laterality: N/A;   CORONARY ARTERY BYPASS GRAFT     CORONARY STENT INTERVENTION N/A 10/07/2019   Procedure: CORONARY STENT INTERVENTION;  Surgeon: Wonda Sharper, MD;  Location: [facility] INVASIVE CV LAB;  Service: Cardiovascular;  Laterality: N/A;   ESOPHAGOGASTRODUODENOSCOPY N/A 07/04/2023   Procedure: EGD (ESOPHAGOGASTRODUODENOSCOPY);  Surgeon: Stacia Glendia BRAVO, MD;  Location: [facility] ENDOSCOPY;  Service: Gastroenterology;  Laterality: N/A;   EYE SURGERY     HEMORROIDECTOMY     RIGHT/LEFT HEART CATH AND CORONARY/GRAFT ANGIOGRAPHY N/A 10/07/2019   Procedure: RIGHT/LEFT HEART CATH AND CORONARY/GRAFT ANGIOGRAPHY;  Surgeon: Wonda Sharper, MD;  Location: [facility] INVASIVE CV LAB;  Service: Cardiovascular;  Laterality: N/A;   TEE WITHOUT CARDIOVERSION N/A 10/27/2019   Procedure: TRANSESOPHAGEAL ECHOCARDIOGRAM (TEE);  Surgeon: Verlin Lonni BIRCH, MD;  Location: [facility] INVASIVE CV LAB;  Service: Open Heart Surgery;  Laterality: N/A;   TRANSCATHETER AORTIC VALVE REPLACEMENT, TRANSFEMORAL N/A 10/27/2019   Procedure: TRANSCATHETER AORTIC VALVE REPLACEMENT, TRANSFEMORAL;  Surgeon: Verlin Lonni BIRCH, MD;  Location: [facility] INVASIVE CV LAB;  Service: Open Heart Surgery;  Laterality: N/A;    FAMILY HISTORY: Family History  Problem Relation Age of Onset   Cancer Mother        bone   Diabetes Mother    Kidney disease Mother    Stroke Mother    Sleep disorder  Mother    Arthritis Father    Migraines Father    Hypertension Father    Sleep disorder Father    Arthritis Brother    Cancer  Brother        throat   Throat cancer Daughter     SOCIAL HISTORY: Social History   Socioeconomic History   Marital status: Widowed    Spouse name: Juanita   Number of children: 2   Years of education: Not on file   Highest education level: 7th grade  Occupational History   Occupation: Retired    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  He has one son that lives with him and one daughter that lives beside of him. He is close with his family.  His sister cooks meals for him regularly.  He has friends at the cafe he visits regularly.   Social Drivers of Corporate investment banker Strain: Low Risk  (03/07/2023)   Overall Financial Resource Strain (CARDIA)    Difficulty of Paying Living Expenses: Not hard at all  Food Insecurity: No Food Insecurity (11/06/2023)   Hunger Vital Sign    Worried About Running Out of Food in the Last Year: Never true    Ran Out of Food in the Last Year: Never true  Transportation Needs: No Transportation Needs (11/06/2023)   PRAPARE - Administrator, Civil Service (Medical): No    Lack of Transportation (Non-Medical): No  Physical Activity: Insufficiently Active (03/07/2023)   Exercise Vital Sign    Days of Exercise per Week: 2 days    Minutes of Exercise per Session: 10 min  Stress: No Stress Concern Present (03/07/2023)   [employer] of Occupational Health  - Occupational Stress Questionnaire    Feeling of Stress : Not at all  Social Connections: Moderately Isolated (03/07/2023)   Social Connection and Isolation Panel    Frequency of Communication with Friends and Family: More than three times a week    Frequency of Social Gatherings with Friends and Family: More than three times a week    Attends Religious Services: 1 to 4 times per year    Active Member of [employer] or Organizations: No    Attends Banker Meetings: Never    Marital Status: Widowed  Intimate Partner Violence: Not At Risk (11/06/2023)   Humiliation, Afraid, Rape, and Kick questionnaire    Fear of Current or Ex-Partner: No    Emotionally Abused: No    Physically Abused: No    Sexually Abused: No      Modena Callander, M.D. Ph.D.  [facility] [address] [place], [place] [address] Ph: [phone] Fax: [phone]  CC:  [name], [place] [address] [place],  [place] [address]  [name], [place]

## 2023-11-14 ENCOUNTER — Telehealth: Payer: Self-pay | Admitting: Neurology

## 2023-11-14 ENCOUNTER — Other Ambulatory Visit: Payer: Self-pay

## 2023-11-14 DIAGNOSIS — S20211S Contusion of right front wall of thorax, sequela: Secondary | ICD-10-CM

## 2023-11-14 NOTE — Telephone Encounter (Signed)
 [facility] is going to take this patient.

## 2023-11-15 NOTE — Procedures (Signed)
 Full Name: Jack Hodges Gender: Male MRN #: [id number] Date of Birth: 02-06-1931    Visit Date: 11/13/2023 12:02 Age: 89 Years Examining Physician: Onita Referring Physician: Onita Height: 5 feet 6 inch History: 89 year old male presenting with worsening gait abnormality lower extremity paresthesia, frequent falling  Summary of the test: Nerve conduction study: It is a technically limited study due to bilateral lateral lower remedy swelling,  Left sural, superficial peroneal sensory responses were absent.  Left peroneal to EDB and tibial motor responses were absent.  Electromyography: Selected needle examination of bilateral lower extremity muscles, and lumbosacral paraspinal muscles were performed.  There was evidence of active neuropathic changes involving bilateral L4-5 S1 myotomes.  There was also evidence of increased insertional activity at bilateral lumbosacral paraspinal muscles   Conclusion: This is an abnormal study.  There is electrodiagnostic evidence of bilateral lumbosacral radiculopathy, likely a superimposed severe peripheral neuropathy as well    ------------------------------- Modena Onita. M.D. Ph.D.   [facility] [address] [place], [place] [address] Tel: [phone] Fax: [phone]  Verbal informed consent was obtained from the patient, patient was informed of potential risk of procedure, including bruising, bleeding, hematoma formation, infection, muscle weakness, muscle pain, numbness, among others.        MNC    Nerve / Sites Muscle Latency Ref. Amplitude Ref. Rel Amp Segments Distance Area    ms ms mV mV %  cm mVms  L Peroneal - EDB     Ankle EDB NR <=6.5 NR >=2.0 NR Ankle - EDB 9 NR         Pop fossa - Ankle    L Tibial - AH     Ankle AH NR <=5.8 NR >=4.0 NR Ankle - AH 9 NR         SNC    Nerve / Sites Rec. Site Peak Lat Ref.  Amp Ref. Segments Distance    ms ms V V  cm  L Sural - Ankle (Calf)      Calf Ankle NR <=4.4 NR >=6 Calf - Ankle 14  L Superficial peroneal - Ankle     Lat leg Ankle NR <=4.4 NR >=6 Lat leg - Ankle 14         EMG Summary Table    Spontaneous MUAP Recruitment  Muscle IA Fib PSW Fasc Other Amp Dur. Poly Pattern  R. Tibialis anterior Increased None None None _______ Normal Normal Normal Discrete  R. Tibialis posterior Increased None None None _______ Normal Normal Normal Discrete  R. Peroneus longus Increased 2+ 2+ None _______ Increased Increased 1+ Reduced  R. Vastus lateralis Increased None None None _______ Increased Increased 1+ Reduced  R. Gastrocnemius (Medial head) Increased None None None _______ Increased Increased 1+ Reduced  R. Lumbar paraspinals (low) Increased None None None _______ Increased Increased 1+ Normal  R. Lumbar paraspinals (mid) Increased None None None _______ Increased Increased 1+ Normal  L. Tibialis anterior Increased 1+ None None _______ Normal Normal Normal Discrete  L. Tibialis posterior Increased None None None _______ Normal Normal Normal Discrete  L. Peroneus longus Increased 1+ None None _______ Increased Increased 1+ Reduced  L. Gastrocnemius (Medial head) Increased None None None _______ Normal Normal Normal Reduced  L. Vastus lateralis Normal None None None _______ Normal Normal Normal Normal  L. Lumbar paraspinals (low) Increased None None None _______ Increased Increased 1+ Reduced  L. Lumbar paraspinals (mid) Normal None None None _______ Normal Normal Normal  Normal

## 2023-11-18 MED ORDER — TRAMADOL HCL 50 MG PO TABS: 50.0000 mg | ORAL_TABLET | Freq: Two times a day (BID) | ORAL | 0 refills | Status: DC | PRN

## 2023-11-18 NOTE — Progress Notes (Unsigned)
 Acute Office Visit  Subjective:    Patient ID: Jack Hodges, male    DOB: 07-11-1930, 89 y.o.   MRN: [id number]  Chief Complaint  Patient presents with   Gout    HPI: Patient is in today for Follow up on acute gout flair and finger fracture.  Discussed the use of AI scribe software for clinical note transcription with the patient, who gave verbal consent to proceed.  History of Present Illness Jack Hodges is a 89 year old male with gout who presents with worsening ankle pain and swelling.  He has significant pain and swelling in his right ankle, worsening over the past few days, described as 'horrible' and causing difficulty in mobility. He has a history of gout. Redness initially progressed up his leg but has since reduced to the ankle area. He was using colchicine , which initially helped with the redness, but he ran out of the medication. He also has watery diarrhea but no stomach pain.  He experiences pain in his finger, described as 'hurts like crazy'. He has been using buddy taping, but it remains painful. He is unable to use his current wheelchair effectively due to issues with the brakes and is seeking a new one that fits his home.  He eats two to three small meals a day and supplements his diet with Boost drinks. He has some bowel and bladder incontinence, attributed to difficulty reaching the bathroom in time.  He is currently taking tramadol  for pain but has run out of this medication. He also takes meclizine  for vertigo, which he is about to run out of. He is on blood thinners and takes Coreg  for blood pressure.    Past Medical History:  Diagnosis Date   Acute bursitis of left shoulder 01/04/2020   Acute on chronic diastolic congestive heart failure (HCC) 11/13/2021   Last Assessment & Plan:  Formatting of this note is different from the original. Pertinent Data:   Current medication includes: furosemide  - 20 mg lisinopriL  - 10 mg metoprolol  succinate - 25 mg.  BNP  (pg/mL)  Date Value  11/15/2021 433.6 (H)   eGFR (mL/min/1.59m*2)  Date Value  11/15/2021 >60.0    BP Readings from Last 3 Encounters:  11/30/21 (!) 147/82  11/15/21 (!) 109/54  11/13/21 (!) 134/74     Acute respiratory failure with hypoxia (HCC) 03/17/2020   Angina pectoris (HCC) 10/07/2019   Arthritis    Atherosclerosis of aorta (HCC) 09/24/2019   Bilateral foot-drop 01/18/2017   BMI 27.0-27.9,adult 09/24/2019   Coronary artery disease involving native coronary artery of native heart without angina pectoris 11/01/2015   Overview:  Bypass surgery in 1996 Overview:  Overview:  Bypass surgery in 1996  Last Assessment & Plan:  Continue his current regimen.  Follow up as noted.   CVA (cerebral vascular accident) (HCC) 04/03/2016   Last Assessment & Plan:  The patient underwent extensive CVA workup.  MRI is negative.  He does have aortic stenosis by echocardiogram.  Upon review of Care everywhere he is followed by Dr. Bernie, cardiologist for this.  Carotid ultrasound negative for hemodynamically significant stenosis.  Lipid panel within normal limits.  He has not had any further episodes of dizziness or nausea vomiting.    Demand ischemia (HCC) 11/14/2021   Last Assessment & Plan:  Formatting of this note might be different from the original. Troponin flat No anginal symptoms Formatting of this note might be different from the original. Last Assessment & Plan:  Formatting  of this note might be different from the original. Troponin flat No anginal symptoms   Dementia (HCC)    Depression    DNR (do not resuscitate) 04/03/2016   Overview:  I had an extensive discussion with the patient on 04/03/2016 regarding his end of life wishes.  He and his daughter agree that do not resuscitate is in keeping with his beliefs.   Dyslipidemia 11/01/2015   Last Assessment & Plan:  Formatting of this note might be different from the original. Cholesterol 136   Triglycerides  83  HDL  50.1  LDL Calculated  69  VLDL  Cholesterol Cal  17   Continue statin.   Dyspnea    with activity   Essential hypertension 11/01/2015   Last Assessment & Plan:  Resume home medications.  Follow-up as noted above.   Generalized weakness 03/13/2020   GERD (gastroesophageal reflux disease)    Herpes zoster 05/19/2019   History of aortic stenosis 03/11/2020   History of CVA (cerebrovascular accident) 11/14/2021   Last Assessment & Plan:  Formatting of this note might be different from the original. Neurologically intact upon assessment -continue aspirin , statin   Hypertensive heart disease with heart failure (HCC) 11/01/2015   Last Assessment & Plan:  Resume home medications.  Follow-up as noted above.   Myocardial infarction (HCC)    approx 1994   Neuropathy    Peripheral neuropathy 11/14/2021   Last Assessment & Plan:  Formatting of this note might be different from the original. Bilateral lower extremity cool to touch with diminished sensation Patient reports findings are chronic Continue home dose Lyrica  Formatting of this note might be different from the original. Last Assessment & Plan:  Formatting of this note might be different from the original. 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   Thrombocytopenia (HCC) 03/13/2020   Trochanteric bursitis of left hip 01/19/2021    Past Surgical History:  Procedure Laterality Date   CARDIAC CATHETERIZATION     CARDIAC SURGERY     CATARACT EXTRACTION, BILATERAL     COLONOSCOPY     [facility]   COLONOSCOPY N/A  07/04/2023   Procedure: COLONOSCOPY;  Surgeon: Stacia Glendia BRAVO, MD;  Location: [facility] ENDOSCOPY;  Service: Gastroenterology;  Laterality: N/A;   CORONARY ARTERY BYPASS GRAFT     CORONARY STENT INTERVENTION N/A 10/07/2019   Procedure: CORONARY STENT INTERVENTION;  Surgeon: Wonda Sharper, MD;  Location[facility] INVASIVE CV LAB;  Service: Cardiovascular;  Laterality: N/A;   ESOPHAGOGASTRODUODENOSCOPY N/A 07/04/2023   Procedure: EGD (ESOPHAGOGASTRODUODENOSCOPY);  Surgeon: Stacia Glendia BRAVO, MD;  Location: [facility] ENDOSCOPY;  Service: Gastroenterology;  Laterality: N/A;   EYE SURGERY     HEMORROIDECTOMY     RIGHT/LEFT HEART CATH AND CORONARY/GRAFT ANGIOGRAPHY N/A 10/07/2019   Procedure: RIGHT/LEFT HEART CATH AND CORONARY/GRAFT ANGIOGRAPHY;  Surgeon: Wonda Sharper, MD;  Location: [facility] INVASIVE CV LAB;  Service: Cardiovascular;  Laterality: N/A;   TEE WITHOUT CARDIOVERSION N/A 10/27/2019   Procedure: TRANSESOPHAGEAL ECHOCARDIOGRAM (TEE);  Surgeon: Verlin Lonni BIRCH, MD;  Location: [facility] INVASIVE CV LAB;  Service: Open Heart Surgery;  Laterality: N/A;   TRANSCATHETER AORTIC VALVE REPLACEMENT, TRANSFEMORAL N/A 10/27/2019   Procedure: TRANSCATHETER AORTIC VALVE REPLACEMENT, TRANSFEMORAL;  Surgeon: Verlin Lonni BIRCH, MD;  Location: [facility] INVASIVE CV LAB;  Service: Open Heart Surgery;  Laterality: N/A;    Family History  Problem Relation Age of Onset   Cancer Mother        bone   Diabetes Mother    Kidney disease Mother    Stroke Mother    Sleep disorder Mother    Arthritis Father    Migraines Father    Hypertension Father    Sleep disorder Father    Arthritis Brother    Cancer Brother        throat   Throat cancer Daughter     Social History   Socioeconomic History   Marital status: Widowed    Spouse name: Juanita   Number of children: 2   Years of education: Not on file   Highest education level: 7th grade  Occupational History   Occupation: Retired    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  He has one son that lives with him and one daughter that lives beside of him. He is close with his family.  His sister cooks meals for him regularly.  He has friends at the cafe he visits regularly.   Social Drivers of Corporate investment banker Strain: Low Risk  (03/07/2023)   Overall Financial Resource Strain (CARDIA)    Difficulty of Paying Living Expenses: Not hard at all  Food Insecurity: No Food Insecurity (11/06/2023)   Hunger Vital Sign    Worried About Running Out of Food in the Last Year: Never true    Ran Out of Food in the Last Year: Never true  Transportation Needs: No Transportation Needs (11/06/2023)   PRAPARE - Administrator, Civil Service (Medical): No    Lack of Transportation (Non-Medical): No  Physical Activity: Insufficiently Active (03/07/2023)   Exercise Vital Sign    Days of Exercise per Week: 2 days    Minutes of Exercise per Session: 10 min  Stress: No Stress Concern Present (03/07/2023)   [employer] of Occupational Health - Occupational Stress Questionnaire    Feeling of Stress : Not at all  Social Connections: Moderately Isolated (03/07/2023)   Social Connection and Isolation Panel    Frequency of Communication with Friends and Family: More than three times a week    Frequency of Social Gatherings with Friends and Family: More than three times a week    Attends Religious Services: 1 to 4 times per year    Active Member of [employer] or Organizations: No    Attends Tax inspector Meetings: Never    Marital Status: Widowed  Intimate Partner Violence: Not At Risk (11/06/2023)   Humiliation, Afraid, Rape, and Kick questionnaire    Fear of Current or Ex-Partner: No    Emotionally  Abused: No    Physically Abused: No    Sexually Abused: No    Outpatient Medications Prior to Visit  Medication Sig Dispense Refill   acetaminophen  (TYLENOL ) 650 MG CR tablet Take 650 mg by mouth every 8 (eight) hours as needed for pain.      albuterol  (VENTOLIN  HFA) 108 (90 Base) MCG/ACT inhaler Inhale 2 puffs into the lungs every 2 (two) hours as needed for wheezing or shortness of breath. 6.7 g 6   ammonium lactate  (LAC-HYDRIN ) 12 % lotion Apply 1 Application topically 2 (two) times daily. Apply twice daily to arms and hands to increase moisture and reduce easy bruising.  Can also be used safely on neck, chest, and legs as needed 400 g 0   ascorbic acid  (VITAMIN C) 500 MG tablet Take 1 tablet (500 mg total) by mouth daily. 30 tablet 0   carvedilol  (COREG ) 12.5 MG tablet Take 12.5 mg by mouth 2 (two) times daily.     cholecalciferol  (VITAMIN D3) 25 MCG (1000 UNIT) tablet Take 1 tablet (1,000 Units total) by mouth daily. 90 tablet 1   ELIQUIS  5 MG TABS tablet Take 5 mg by mouth 2 (two) times daily.     ezetimibe  (ZETIA ) 10 MG tablet Take 1 tablet (10 mg total) by mouth daily. 90 tablet 1   furosemide  (LASIX ) 20 MG tablet Take 20 mg by mouth 2 (two) times daily.     nitroGLYCERIN  (NITROSTAT ) 0.4 MG SL tablet Place 1 tablet (0.4 mg total) under the tongue every 5 (five) minutes as needed for chest pain. 25 tablet 2   pantoprazole  (PROTONIX ) 40 MG tablet Take 1 tablet (40 mg total) by mouth daily. 100 tablet 1   potassium chloride  (KLOR-CON ) 10 MEQ tablet Take 1 tablet (10 mEq total) by mouth daily. 90 tablet 3   rosuvastatin  (CRESTOR ) 20 MG tablet Take 1 tablet (20 mg total) by mouth at bedtime. 90 tablet 3   sertraline  (ZOLOFT ) 50 MG tablet TAKE ONE TABLET BY MOUTH ONCE DAILY 90  tablet 1   vitamin B-12 (CYANOCOBALAMIN ) 500 MCG tablet Take 500 mcg by mouth daily.     zinc  sulfate 220 (50 Zn) MG capsule Take 1 capsule (220 mg total) by mouth daily. 90 capsule 1   meclizine  (ANTIVERT ) 50 MG tablet Take 1 tablet (50 mg total) by mouth 3 (three) times daily as needed. 30 tablet 2   amoxicillin -clavulanate (AUGMENTIN ) 875-125 MG tablet Take 1 tablet by mouth 2 (two) times daily. 14 tablet 0   colchicine  0.6 MG tablet Take 1 tablet (0.6 mg total) by mouth 2 (two) times daily. 14 tablet 0   pregabalin  (LYRICA ) 75 MG capsule TAKE ONE CAPSULE BY MOUTH TWICE DAILY 90 capsule 0   traMADol  (ULTRAM ) 50 MG tablet Take 1 tablet (50 mg total) by mouth 2 (two) times daily as needed. 60 tablet 0   No facility-administered medications prior to visit.    Allergies  Allergen Reactions   Poison Oak Extract Rash    Review of Systems  Constitutional:  Negative for appetite change, fatigue and fever.  HENT:  Negative for congestion, ear pain, sinus pressure and sore throat.   Respiratory:  Negative for cough, chest tightness, shortness of breath and wheezing.   Cardiovascular:  Negative for chest pain and palpitations.  Gastrointestinal:  Negative for abdominal pain, constipation, diarrhea, nausea and vomiting.  Genitourinary:  Negative for dysuria and hematuria.  Musculoskeletal:  Positive for joint swelling (Right hand 3rd finger and right foot/ankle  swelled with redness). Negative for arthralgias, back pain and myalgias.  Skin:  Negative for rash.  Neurological:  Negative for dizziness, weakness and headaches.  Psychiatric/Behavioral:  Negative for dysphoric mood. The patient is not nervous/anxious.        Objective:        11/19/2023    2:29 PM 11/13/2023   11:42 AM 11/13/2023   11:37 AM  Vitals with BMI  Height 5' 6    Weight 154 lbs  163 lbs  BMI 24.87  26.32  Systolic 144 129 855  Diastolic 68 76 75  Pulse 75  82    No data found.    Physical  Exam Musculoskeletal:     Right ankle: Swelling present. Tenderness present. Decreased range of motion.     Comments: Middle finger with enlarged joint, no erythema. Drainage decreased.  Ankle slightly red, slightly warm, denies fever.      Health Maintenance Due  Topic Date Due   INFLUENZA VACCINE  10/25/2023    There are no preventive care reminders to display for this patient.   No results found for: TSH Lab Results  Component Value Date   WBC 17.7 (H) 11/01/2023   HGB 11.4 (L) 11/01/2023   HCT 36.1 (L) 11/01/2023   MCV 94 11/01/2023   PLT 271 11/01/2023   Lab Results  Component Value Date   NA 144 11/01/2023   K 3.5 11/01/2023   CO2 18 (L) 11/01/2023   GLUCOSE 140 (H) 11/01/2023   BUN 19 11/01/2023   CREATININE 1.11 11/01/2023   BILITOT 1.5 (H) 11/01/2023   ALKPHOS 162 (H) 11/01/2023   AST 20 11/01/2023   ALT 9 11/01/2023   PROT 5.9 (L) 11/01/2023   ALBUMIN 4.0 11/01/2023   CALCIUM  9.1 11/01/2023   ANIONGAP 14 09/30/2023   EGFR 63 11/01/2023   Lab Results  Component Value Date   CHOL 106 05/08/2023   Lab Results  Component Value Date   HDL 27 (L) 05/08/2023   Lab Results  Component Value Date   LDLCALC 58 05/08/2023   Lab Results  Component Value Date   TRIG 112 05/08/2023   Lab Results  Component Value Date   CHOLHDL 3.9 05/08/2023   Lab Results  Component Value Date   HGBA1C 5.3 05/08/2023       Assessment & Plan:  Acute gout of right ankle, unspecified cause Assessment & Plan: Acute gout flare with persistent pain and swelling despite previous colchicine  treatment. Uric acid crystals likely contributing to joint pain. - Prescribed colchicine  for 30 days, once daily for two weeks, then reassess. - Monitor kidney function due to potential impact of colchicine . - Schedule follow-up in one month to assess improvement and physical therapy progress.  Orders: -     Colchicine ; Take 1 tablet (0.6 mg total) by mouth daily.  Dispense: 30  tablet; Refill: 0  Rib contusion, right, sequela Assessment & Plan: Rib contusion due to falls. Pain managed with tramadol . - Encourage use of incentive spirometer.  Orders: -     traMADol  HCl; Take 1 tablet (50 mg total) by mouth 2 (two) times daily as needed.  Dispense: 60 tablet; Refill: 0  Vertigo Assessment & Plan: Vertigo managed with meclizine , effective in symptom control. - Prescribe meclizine  for vertigo management.  Orders: -     Meclizine  HCl; Take 1 tablet (50 mg total) by mouth 2 (two) times daily as needed.  Dispense: 30 tablet; Refill: 2  Diarrhea, unspecified type Assessment &  Plan: Watery diarrhea reported.   Chronic pain syndrome Assessment & Plan: Chronic pain persists, especially in the finger and ankle. Tramadol  preferred over acetaminophen . - Prescribe tramadol  50 mg for pain management.   At high risk for malnutrition Assessment & Plan: Risk of malnutrition due to poor oral intake. Consuming Boost and Ensure, but intake remains low. - Encourage consumption of protein drinks like Boost and Ensure between meals. - Monitor nutritional intake and adjust as needed.   Bowel and bladder incontinence Assessment & Plan: Bowel and bladder incontinence due to inability to reach bathroom in time, exacerbated by difficulty in ambulating quickly.   Bilateral foot-drop Assessment & Plan: Difficulty ambulating due to lower extremity weakness and right foot drop, exacerbated by thick carpet. - Initiate home physical therapy to improve strength and mobility. - Evaluate effectiveness of physical therapy in one month.   Closed nondisplaced fracture of phalanx of right middle finger with routine healing, unspecified phalanx, subsequent encounter Assessment & Plan: Fracture with swelling and pain. Improvement noted with colchicine , but continued monitoring necessary. - Continue colchicine  treatment to reduce swelling. - Consider referral to hand specialist if  symptoms persist after one month.   Other orders -     Amoxicillin ; Take 4 capsules one hour before dentist visit.  Dispense: 30 capsule; Refill: 0      Meds ordered this encounter  Medications   meclizine  (ANTIVERT ) 50 MG tablet    Sig: Take 1 tablet (50 mg total) by mouth 2 (two) times daily as needed.    Dispense:  30 tablet    Refill:  2   traMADol  (ULTRAM ) 50 MG tablet    Sig: Take 1 tablet (50 mg total) by mouth 2 (two) times daily as needed.    Dispense:  60 tablet    Refill:  0   amoxicillin  (AMOXIL ) 500 MG capsule    Sig: Take 4 capsules one hour before dentist visit.    Dispense:  30 capsule    Refill:  0   colchicine  0.6 MG tablet    Sig: Take 1 tablet (0.6 mg total) by mouth daily.    Dispense:  30 tablet    Refill:  0    No orders of the defined types were placed in this encounter.    Follow-up: Return in about 4 weeks (around 12/17/2023) for Chronic, Jack.  An After Visit Summary was printed and given to the patient.  I,Lauren M Auman,acting as a Neurosurgeon for [employer], PA.,have documented all relevant documentation on the behalf of Jack Angles, PA,as directed by  Jack Angles, PA while in the presence of Jack Hodges, [place].    LILLETTE Kato I Leal-Borjas,acting as a scribe for Jack Angles, PA.,have documented all relevant documentation on the behalf of Jack Angles, PA,as directed by  Jack Angles, PA while in the presence of Jack Hodges, [place].    Jack Hodges, [place] [facility] [phone]

## 2023-11-18 NOTE — Telephone Encounter (Signed)
 [facility] called to let us  know per patient request he is scheduled to start services this Wednesday 8/27

## 2023-11-19 ENCOUNTER — Encounter: Payer: Self-pay | Admitting: Physician Assistant

## 2023-11-19 ENCOUNTER — Ambulatory Visit (INDEPENDENT_AMBULATORY_CARE_PROVIDER_SITE_OTHER): Admitting: Physician Assistant

## 2023-11-19 VITALS — BP 144/68 | HR 75 | Temp 97.3°F | Ht 66.0 in | Wt 154.0 lb

## 2023-11-19 DIAGNOSIS — R159 Full incontinence of feces: Secondary | ICD-10-CM | POA: Diagnosis not present

## 2023-11-19 DIAGNOSIS — G894 Chronic pain syndrome: Secondary | ICD-10-CM | POA: Diagnosis not present

## 2023-11-19 DIAGNOSIS — M21372 Foot drop, left foot: Secondary | ICD-10-CM

## 2023-11-19 DIAGNOSIS — S20211S Contusion of right front wall of thorax, sequela: Secondary | ICD-10-CM

## 2023-11-19 DIAGNOSIS — R197 Diarrhea, unspecified: Secondary | ICD-10-CM | POA: Diagnosis not present

## 2023-11-19 DIAGNOSIS — M21371 Foot drop, right foot: Secondary | ICD-10-CM | POA: Diagnosis not present

## 2023-11-19 DIAGNOSIS — R32 Unspecified urinary incontinence: Secondary | ICD-10-CM | POA: Diagnosis not present

## 2023-11-19 DIAGNOSIS — S62602D Fracture of unspecified phalanx of right middle finger, subsequent encounter for fracture with routine healing: Secondary | ICD-10-CM | POA: Diagnosis not present

## 2023-11-19 DIAGNOSIS — Z9189 Other specified personal risk factors, not elsewhere classified: Secondary | ICD-10-CM

## 2023-11-19 DIAGNOSIS — R42 Dizziness and giddiness: Secondary | ICD-10-CM | POA: Diagnosis not present

## 2023-11-19 DIAGNOSIS — M109 Gout, unspecified: Secondary | ICD-10-CM

## 2023-11-19 MED ORDER — COLCHICINE 0.6 MG PO TABS: 0.6000 mg | ORAL_TABLET | Freq: Every day | ORAL | 0 refills | Status: DC

## 2023-11-19 MED ORDER — MECLIZINE HCL 50 MG PO TABS: 50.0000 mg | ORAL_TABLET | Freq: Two times a day (BID) | ORAL | 2 refills | Status: DC | PRN

## 2023-11-19 MED ORDER — AMOXICILLIN 500 MG PO CAPS
ORAL_CAPSULE | ORAL | 0 refills | Status: DC
Start: 2023-11-19 — End: 2023-12-27

## 2023-11-19 MED ORDER — TRAMADOL HCL 50 MG PO TABS: 50.0000 mg | ORAL_TABLET | Freq: Two times a day (BID) | ORAL | 0 refills | Status: DC | PRN

## 2023-11-20 ENCOUNTER — Telehealth: Payer: Self-pay | Admitting: Neurology

## 2023-11-20 ENCOUNTER — Telehealth: Payer: Self-pay

## 2023-11-20 DIAGNOSIS — I5033 Acute on chronic diastolic (congestive) heart failure: Secondary | ICD-10-CM | POA: Diagnosis not present

## 2023-11-20 DIAGNOSIS — F32A Depression, unspecified: Secondary | ICD-10-CM | POA: Diagnosis not present

## 2023-11-20 DIAGNOSIS — G2581 Restless legs syndrome: Secondary | ICD-10-CM | POA: Diagnosis not present

## 2023-11-20 DIAGNOSIS — D696 Thrombocytopenia, unspecified: Secondary | ICD-10-CM | POA: Diagnosis not present

## 2023-11-20 DIAGNOSIS — I110 Hypertensive heart disease with heart failure: Secondary | ICD-10-CM | POA: Diagnosis not present

## 2023-11-20 DIAGNOSIS — I340 Nonrheumatic mitral (valve) insufficiency: Secondary | ICD-10-CM | POA: Diagnosis not present

## 2023-11-20 DIAGNOSIS — M21372 Foot drop, left foot: Secondary | ICD-10-CM | POA: Diagnosis not present

## 2023-11-20 DIAGNOSIS — M5440 Lumbago with sciatica, unspecified side: Secondary | ICD-10-CM | POA: Diagnosis not present

## 2023-11-20 DIAGNOSIS — F0394 Unspecified dementia, unspecified severity, with anxiety: Secondary | ICD-10-CM | POA: Diagnosis not present

## 2023-11-20 DIAGNOSIS — K219 Gastro-esophageal reflux disease without esophagitis: Secondary | ICD-10-CM | POA: Diagnosis not present

## 2023-11-20 DIAGNOSIS — M1990 Unspecified osteoarthritis, unspecified site: Secondary | ICD-10-CM | POA: Diagnosis not present

## 2023-11-20 DIAGNOSIS — M48062 Spinal stenosis, lumbar region with neurogenic claudication: Secondary | ICD-10-CM | POA: Diagnosis not present

## 2023-11-20 DIAGNOSIS — F03918 Unspecified dementia, unspecified severity, with other behavioral disturbance: Secondary | ICD-10-CM | POA: Diagnosis not present

## 2023-11-20 DIAGNOSIS — I252 Old myocardial infarction: Secondary | ICD-10-CM | POA: Diagnosis not present

## 2023-11-20 DIAGNOSIS — I700 Atherosclerosis of aorta: Secondary | ICD-10-CM | POA: Diagnosis not present

## 2023-11-20 DIAGNOSIS — M25571 Pain in right ankle and joints of right foot: Secondary | ICD-10-CM | POA: Diagnosis not present

## 2023-11-20 DIAGNOSIS — G8929 Other chronic pain: Secondary | ICD-10-CM | POA: Diagnosis not present

## 2023-11-20 DIAGNOSIS — G5603 Carpal tunnel syndrome, bilateral upper limbs: Secondary | ICD-10-CM | POA: Diagnosis not present

## 2023-11-20 DIAGNOSIS — I4819 Other persistent atrial fibrillation: Secondary | ICD-10-CM | POA: Diagnosis not present

## 2023-11-20 DIAGNOSIS — G4730 Sleep apnea, unspecified: Secondary | ICD-10-CM | POA: Diagnosis not present

## 2023-11-20 DIAGNOSIS — D500 Iron deficiency anemia secondary to blood loss (chronic): Secondary | ICD-10-CM | POA: Diagnosis not present

## 2023-11-20 DIAGNOSIS — I2510 Atherosclerotic heart disease of native coronary artery without angina pectoris: Secondary | ICD-10-CM | POA: Diagnosis not present

## 2023-11-20 DIAGNOSIS — G4700 Insomnia, unspecified: Secondary | ICD-10-CM | POA: Diagnosis not present

## 2023-11-20 DIAGNOSIS — F0393 Unspecified dementia, unspecified severity, with mood disturbance: Secondary | ICD-10-CM | POA: Diagnosis not present

## 2023-11-20 NOTE — Patient Instructions (Signed)
 Rawson W Hollister - I am sorry I was unable to reach you today for our scheduled appointment. I work with [name], PA and am calling to support your healthcare needs. Please contact me at [phone] at your earliest convenience. I look forward to speaking with you soon.   Thank you,   Heddy Shutter, RN, MSN, BSN, CCM [place]  [facility], Population Health Case Manager Phone: [phone]

## 2023-11-20 NOTE — Telephone Encounter (Signed)
 Jack Hodges, PT w/ [facility] [phone] secure vm, she is requesting verbal orders for 1 week 2, 2 week 3 and 1 week 5 for PT

## 2023-11-20 NOTE — Telephone Encounter (Signed)
 Returned call to rochelle, verbal orders given. Rochelle voiced gratitude and understanding to agree to verbal orders under Dr. Onita

## 2023-11-21 ENCOUNTER — Telehealth: Payer: Self-pay

## 2023-11-21 NOTE — Telephone Encounter (Signed)
 Copied from CRM [id number]. Topic: Clinical - Prescription Issue >> Nov 21, 2023 11:08 AM [name] wrote: Reason for CRM: Son called.SABRA looking to get regular wheelchair.. covered on his insurance?

## 2023-11-23 DIAGNOSIS — R197 Diarrhea, unspecified: Secondary | ICD-10-CM | POA: Insufficient documentation

## 2023-11-23 DIAGNOSIS — S62602D Fracture of unspecified phalanx of right middle finger, subsequent encounter for fracture with routine healing: Secondary | ICD-10-CM | POA: Insufficient documentation

## 2023-11-23 DIAGNOSIS — R32 Unspecified urinary incontinence: Secondary | ICD-10-CM

## 2023-11-23 DIAGNOSIS — M109 Gout, unspecified: Secondary | ICD-10-CM | POA: Insufficient documentation

## 2023-11-23 DIAGNOSIS — G894 Chronic pain syndrome: Secondary | ICD-10-CM

## 2023-11-23 DIAGNOSIS — Z9189 Other specified personal risk factors, not elsewhere classified: Secondary | ICD-10-CM | POA: Insufficient documentation

## 2023-11-23 DIAGNOSIS — R159 Full incontinence of feces: Secondary | ICD-10-CM | POA: Insufficient documentation

## 2023-11-23 HISTORY — DX: Gout, unspecified: M10.9

## 2023-11-23 HISTORY — DX: Full incontinence of feces: R32

## 2023-11-23 HISTORY — DX: Other specified personal risk factors, not elsewhere classified: Z91.89

## 2023-11-23 HISTORY — DX: Fracture of unspecified phalanx of right middle finger, subsequent encounter for fracture with routine healing: S62.602D

## 2023-11-23 HISTORY — DX: Chronic pain syndrome: G89.4

## 2023-11-23 HISTORY — DX: Diarrhea, unspecified: R19.7

## 2023-11-23 NOTE — Assessment & Plan Note (Signed)
 Risk of malnutrition due to poor oral intake. Consuming Boost and Ensure, but intake remains low. - Encourage consumption of protein drinks like Boost and Ensure between meals. - Monitor nutritional intake and adjust as needed.

## 2023-11-23 NOTE — Assessment & Plan Note (Signed)
 Difficulty ambulating due to lower extremity weakness and right foot drop, exacerbated by thick carpet. - Initiate home physical therapy to improve strength and mobility. - Evaluate effectiveness of physical therapy in one month.

## 2023-11-23 NOTE — Assessment & Plan Note (Signed)
 Rib contusion due to falls. Pain managed with tramadol . - Encourage use of incentive spirometer.

## 2023-11-23 NOTE — Assessment & Plan Note (Signed)
 Bowel and bladder incontinence due to inability to reach bathroom in time, exacerbated by difficulty in ambulating quickly.

## 2023-11-23 NOTE — Assessment & Plan Note (Signed)
 Fracture with swelling and pain. Improvement noted with colchicine , but continued monitoring necessary. - Continue colchicine  treatment to reduce swelling. - Consider referral to hand specialist if symptoms persist after one month.

## 2023-11-23 NOTE — Assessment & Plan Note (Signed)
 Chronic pain persists, especially in the finger and ankle. Tramadol  preferred over acetaminophen . - Prescribe tramadol  50 mg for pain management.

## 2023-11-23 NOTE — Assessment & Plan Note (Signed)
 Vertigo managed with meclizine , effective in symptom control. - Prescribe meclizine  for vertigo management.

## 2023-11-23 NOTE — Assessment & Plan Note (Signed)
 Acute gout flare with persistent pain and swelling despite previous colchicine  treatment. Uric acid crystals likely contributing to joint pain. - Prescribed colchicine  for 30 days, once daily for two weeks, then reassess. - Monitor kidney function due to potential impact of colchicine . - Schedule follow-up in one month to assess improvement and physical therapy progress.

## 2023-11-23 NOTE — Assessment & Plan Note (Signed)
 Watery diarrhea reported.

## 2023-11-29 ENCOUNTER — Encounter: Payer: Self-pay | Admitting: Cardiology

## 2023-11-29 ENCOUNTER — Ambulatory Visit: Attending: Cardiology | Admitting: Cardiology

## 2023-11-29 VITALS — BP 136/80 | HR 74 | Ht 66.0 in | Wt 137.7 lb

## 2023-11-29 DIAGNOSIS — Z952 Presence of prosthetic heart valve: Secondary | ICD-10-CM | POA: Diagnosis not present

## 2023-11-29 DIAGNOSIS — E785 Hyperlipidemia, unspecified: Secondary | ICD-10-CM | POA: Diagnosis not present

## 2023-11-29 DIAGNOSIS — G252 Other specified forms of tremor: Secondary | ICD-10-CM | POA: Diagnosis not present

## 2023-11-29 DIAGNOSIS — Z8673 Personal history of transient ischemic attack (TIA), and cerebral infarction without residual deficits: Secondary | ICD-10-CM | POA: Diagnosis not present

## 2023-11-29 NOTE — Progress Notes (Signed)
 Cardiology Office Note:    Date:  11/29/2023   ID:  Jack Hodges, Jack Hodges 03-23-1931, MRN [id number]  PCP:  [name], PA  Cardiologist:  [name], MD    Referring MD: [name], [place]   No chief complaint on file.   History of Present Illness:    Jack Hodges is a 89 y.o. male   with past medical history significant for coronary artery disease, in 1996 he required coronary artery bypass graft.  Also in summer 2020 when he got cardiac catheterization done he underwent PTCA and stenting of left anterior descending artery in view of the fact that he got completely occluded LIMA going to [facility].  That cardiac catheterization was done in preparation for TAVI that eventually ended up having done on 10/27/2019.  He received Edwards sapiens 3 26 mm valve.  He also got history of bilateral foot drop, essential hypertension, dyslipidemia.   Recently in the being in the hospital because of fall that he sustained overall he said his balance became much worse and he had multiple falls while in the hospital he was found to be in atrial fibrillation.   Comes today to my office for follow-up.  He is not doing well he is losing weight he is very weak tired and he look almost cachectic.  He comes with his son who is staying with him and taking care of him.  He denies have any chest pain tightness squeezing pressure burning chest.  He was found to have gout he was given colchicine  however does have diarrhea probably to be transient with different medication  Past Medical History:  Diagnosis Date   Abnormal CBC 06/13/2023   Acute bursitis of left shoulder 01/04/2020   Acute gout of right ankle 11/23/2023   Acute on chronic diastolic congestive heart failure (HCC) 11/13/2021   Last Assessment & Plan:  Formatting of this note is different from the original. Pertinent Data:   Current medication includes: furosemide  - 20 mg lisinopriL  - 10 mg metoprolol  succinate - 25 mg.  BNP (pg/mL)  Date Value  11/15/2021  433.6 (H)   eGFR (mL/min/1.65m*2)  Date Value  11/15/2021 >60.0    BP Readings from Last 3 Encounters:  11/30/21 (!) 147/82  11/15/21 (!) 109/54  11/13/21 (!) 134/74     Acute pharyngitis due to other specified organisms 03/31/2022   Acute right ankle pain 11/10/2023   Anemia of chronic disease 11/10/2023   Angina pectoris (HCC) 10/07/2019   Arthritis    At high risk for malnutrition 11/23/2023   Atherosclerosis of aorta (HCC) 09/24/2019   Bilateral carpal tunnel syndrome 06/13/2023   Bilateral foot-drop 01/18/2017   BMI 27.0-27.9,adult 09/24/2019   Bowel and bladder incontinence 11/23/2023   Chronic low back pain with sciatica 09/10/2023   Chronic pain syndrome 11/23/2023   Closed nondisplaced fracture of phalanx of right middle finger with routine healing 11/23/2023   Congestive heart failure (HCC) 10/16/2023   Constipation 05/01/2023   Coronary artery disease involving native coronary artery of native heart without angina pectoris 11/01/2015   Overview:  Bypass surgery in 1996 Overview:  Overview:  Bypass surgery in 1996  Last Assessment & Plan:  Continue his current regimen.  Follow up as noted.   CVA (cerebral vascular accident) (HCC) 04/03/2016   Last Assessment & Plan:  The patient underwent extensive CVA workup.  MRI is negative.  He does have aortic stenosis by echocardiogram.  Upon review of Care everywhere he is followed by Dr.  Wilmore Holsomback, cardiologist for this.  Carotid ultrasound negative for hemodynamically significant stenosis.  Lipid panel within normal limits.  He has not had any further episodes of dizziness or nausea vomiting.    Demand ischemia (HCC) 11/14/2021   Last Assessment & Plan:  Formatting of this note might be different from the original. Troponin flat No anginal symptoms Formatting of this note might be different from the original. Last Assessment & Plan:  Formatting of this note might be different from the original. Troponin flat No anginal symptoms   Dementia  (HCC)    Depression    Diarrhea 11/23/2023   DNR (do not resuscitate) 04/03/2016   Overview:  I had an extensive discussion with the patient on 04/03/2016 regarding his end of life wishes.  He and his daughter agree that do not resuscitate is in keeping with his beliefs.   Dyslipidemia 11/01/2015   Last Assessment & Plan:  Formatting of this note might be different from the original. Cholesterol 136   Triglycerides  83  HDL  50.1  LDL Calculated  69  VLDL Cholesterol Cal  17   Continue statin.   Dyspnea    with activity   Essential hypertension 11/01/2015   Last Assessment & Plan:  Resume home medications.  Follow-up as noted above.   Gait abnormality 09/10/2023   Gastric ulcer without hemorrhage or perforation 07/04/2023   Generalized weakness 03/13/2020   GERD (gastroesophageal reflux disease)    H/O diplopia 05/08/2023   Herpes zoster 05/19/2019   History of aortic stenosis 03/11/2020   History of CVA (cerebrovascular accident) 11/14/2021   Last Assessment & Plan:  Formatting of this note might be different from the original. Neurologically intact upon assessment -continue aspirin , statin   History of stomach ulcers 10/16/2023   Hypertensive heart disease with heart failure (HCC) 11/01/2015   Last Assessment & Plan:  Resume home medications.  Follow-up as noted above.   Inflammation of right hand joint 11/10/2023   Insomnia 05/01/2023   Laceration of skin of right wrist 02/13/2022   Melena 06/12/2023   Mitral regurgitation 01/26/2022   Myocardial infarction [facility])    approx 1994   Neck pain 07/20/2023   Neuropathy    Peripheral neuropathy 11/14/2021   Last Assessment & Plan:  Formatting of this note might be different from the original. Bilateral lower extremity cool to touch with diminished sensation Patient reports findings are chronic Continue home dose Lyrica  Formatting of this note might be different from the original. Last Assessment & Plan:  Formatting of this note might be  different from the original. 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   Thrombocytopenia (HCC) 03/13/2020   TIA (transient ischemic attack) 03/30/2023   Trochanteric bursitis of left hip 01/19/2021   Vertigo 04/19/2016   Weight loss, unintentional 06/13/2023    Past Surgical History:  Procedure Laterality Date   CARDIAC CATHETERIZATION  CARDIAC SURGERY     CATARACT EXTRACTION, BILATERAL     COLONOSCOPY     [facility]   COLONOSCOPY N/A 07/04/2023   Procedure: COLONOSCOPY;  Surgeon: Stacia Glendia BRAVO, MD;  Location: [facility] ENDOSCOPY;  Service: Gastroenterology;  Laterality: N/A;   CORONARY ARTERY BYPASS GRAFT     CORONARY STENT INTERVENTION N/A 10/07/2019   Procedure: CORONARY STENT INTERVENTION;  Surgeon: Wonda Sharper, MD;  Location: [facility] INVASIVE CV LAB;  Service: Cardiovascular;  Laterality: N/A;   ESOPHAGOGASTRODUODENOSCOPY N/A 07/04/2023   Procedure: EGD (ESOPHAGOGASTRODUODENOSCOPY);  Surgeon: Stacia Glendia BRAVO, MD;  Location: [facility] ENDOSCOPY;  Service: Gastroenterology;  Laterality: N/A;   EYE SURGERY     HEMORROIDECTOMY     RIGHT/LEFT HEART CATH AND CORONARY/GRAFT ANGIOGRAPHY N/A 10/07/2019   Procedure: RIGHT/LEFT HEART CATH AND CORONARY/GRAFT ANGIOGRAPHY;  Surgeon: Wonda Sharper, MD;  Location: [facility] INVASIVE CV LAB;  Service: Cardiovascular;  Laterality: N/A;    TEE WITHOUT CARDIOVERSION N/A 10/27/2019   Procedure: TRANSESOPHAGEAL ECHOCARDIOGRAM (TEE);  Surgeon: Verlin Lonni BIRCH, MD;  Location: [facility] INVASIVE CV LAB;  Service: Open Heart Surgery;  Laterality: N/A;   TRANSCATHETER AORTIC VALVE REPLACEMENT, TRANSFEMORAL N/A 10/27/2019   Procedure: TRANSCATHETER AORTIC VALVE REPLACEMENT, TRANSFEMORAL;  Surgeon: Verlin Lonni BIRCH, MD;  Location: [facility] INVASIVE CV LAB;  Service: Open Heart Surgery;  Laterality: N/A;    Current Medications: Current Meds  Medication Sig   acetaminophen  (TYLENOL ) 650 MG CR tablet Take 650 mg by mouth every 8 (eight) hours as needed for pain.    albuterol  (VENTOLIN  HFA) 108 (90 Base) MCG/ACT inhaler Inhale 2 puffs into the lungs every 2 (two) hours as needed for wheezing or shortness of breath.   ammonium lactate  (LAC-HYDRIN ) 12 % lotion Apply 1 Application topically 2 (two) times daily. Apply twice daily to arms and hands to increase moisture and reduce easy bruising.  Can also be used safely on neck, chest, and legs as needed   amoxicillin  (AMOXIL ) 500 MG capsule Take 4 capsules one hour before dentist visit.   ascorbic acid  (VITAMIN C) 500 MG tablet Take 1 tablet (500 mg total) by mouth daily.   carvedilol  (COREG ) 12.5 MG tablet Take 12.5 mg by mouth 2 (two) times daily.   cholecalciferol  (VITAMIN D3) 25 MCG (1000 UNIT) tablet Take 1 tablet (1,000 Units total) by mouth daily.   colchicine  0.6 MG tablet Take 1 tablet (0.6 mg total) by mouth daily.   ELIQUIS  5 MG TABS tablet Take 5 mg by mouth 2 (two) times daily.   ezetimibe  (ZETIA ) 10 MG tablet Take 1 tablet (10 mg total) by mouth daily.   furosemide  (LASIX ) 20 MG tablet Take 40 mg by mouth daily.   meclizine  (ANTIVERT ) 50 MG tablet Take 1 tablet (50 mg total) by mouth 2 (two) times daily as needed.   nitroGLYCERIN  (NITROSTAT ) 0.4 MG SL tablet Place 1 tablet (0.4 mg total) under the tongue every 5 (five) minutes as needed for chest pain.   pantoprazole  (PROTONIX )  40 MG tablet Take 1 tablet (40 mg total) by mouth daily.   potassium chloride  (KLOR-CON ) 10 MEQ tablet Take 1 tablet (10 mEq total) by mouth daily.   rosuvastatin  (CRESTOR ) 20 MG tablet Take 1 tablet (20 mg total) by mouth at bedtime.   sertraline  (ZOLOFT ) 50 MG tablet TAKE ONE TABLET BY MOUTH ONCE DAILY   traMADol  (ULTRAM ) 50 MG tablet Take 1 tablet (50 mg total) by mouth 2 (two) times daily as needed.   vitamin B-12 (CYANOCOBALAMIN ) 500 MCG tablet Take  500 mcg by mouth daily.   zinc  sulfate 220 (50 Zn) MG capsule Take 1 capsule (220 mg total) by mouth daily.     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  He has one son that lives with him and one daughter that lives beside of him. He is close with his family.  His sister cooks meals for him regularly.  He has friends at the cafe he visits regularly.   Social Drivers of Corporate investment banker Strain: Low Risk  (03/07/2023)   Overall Financial Resource Strain (CARDIA)    Difficulty of Paying Living Expenses: Not hard at all  Food Insecurity: No Food Insecurity (11/06/2023)   Hunger Vital Sign    Worried About Running Out of Food in the Last Year: Never true    Ran Out of Food in the Last Year: Never true  Transportation  Needs: No Transportation Needs (11/06/2023)   PRAPARE - Administrator, Civil Service (Medical): No    Lack of Transportation (Non-Medical): No  Physical Activity: Insufficiently Active (03/07/2023)   Exercise Vital Sign    Days of Exercise per Week: 2 days    Minutes of Exercise per Session: 10 min  Stress: No Stress Concern Present (03/07/2023)   [employer] of Occupational Health - Occupational Stress Questionnaire    Feeling of Stress : Not at all  Social Connections: Moderately Isolated (03/07/2023)   Social Connection and Isolation Panel    Frequency of Communication with Friends and Family: More than three times a week    Frequency of Social Gatherings with Friends and Family: More than three times a week    Attends Religious Services: 1 to 4 times per year    Active Member of [employer] or Organizations: No    Attends Banker Meetings: Never    Marital Status: Widowed     Family History: The patient's family history includes Arthritis in his brother and father; Cancer in his brother and mother; Diabetes in his mother; Hypertension in his father; Kidney disease in his mother; Migraines in his father; Sleep disorder in his father and mother; Stroke in his mother; Throat cancer in his daughter. ROS:   Please see the history of present illness.    All 14 point review of systems negative except as described per history of present illness  EKGs/Labs/Other Studies Reviewed:         Recent Labs: 10/14/2023: NT-Pro BNP 11,631 11/01/2023: ALT 9; BUN 19; Creatinine, Ser 1.11; Hemoglobin 11.4; Platelets 271; Potassium 3.5; Sodium 144  Recent Lipid Panel    Component Value Date/Time   CHOL  106 05/08/2023 1044   TRIG 112 05/08/2023 1044   HDL 27 (L) 05/08/2023 1044   CHOLHDL 3.9 05/08/2023 1044   LDLCALC 58 05/08/2023 1044    Physical Exam:    VS:  BP 136/80   Pulse 74   Ht 5' 6 (1.676 m)   Wt 137 lb 11.2 oz (62.5 kg)   SpO2 93%   BMI 22.23 kg/m      Wt Readings from Last 3 Encounters:  11/29/23 137 lb 11.2 oz (62.5 kg)  11/19/23 154 lb (69.9 kg)  11/13/23 163 lb (73.9 kg)     GEN:  Well nourished, well developed in no acute distress HEENT: Normal NECK: No JVD; No carotid bruits LYMPHATICS: No lymphadenopathy CARDIAC: RRR, no murmurs, no rubs, no gallops RESPIRATORY:  Clear to auscultation without rales, wheezing or rhonchi  ABDOMEN: Soft, non-tender, non-distended MUSCULOSKELETAL:  No edema; No deformity  SKIN: Warm and dry LOWER EXTREMITIES: no swelling NEUROLOGIC:  Alert and oriented x 3 PSYCHIATRIC:  Normal affect   ASSESSMENT:    1. S/P TAVR (transcatheter aortic valve replacement)   2. Resting tremor   3. History of CVA (cerebrovascular accident)   4. Dyslipidemia    PLAN:    In order of problems listed above:  Status post TAVI.  Valve sounds good we will continue monitoring. History of CVA.  Noted. Dyslipidemia I did review K PN which show me data from February with HDL 27 LDL 58 with cholesterol we will continue present management. I am worried about him he is looking much worse than previously and I see gradual deterioration of condition overall but I do not think it is related to his heart   Medication Adjustments/Labs and Tests Ordered: Current medicines are reviewed at length with the patient today.  Concerns regarding medicines are outlined above.  No orders of the defined types were placed in this encounter.  Medication changes: No orders of the defined types were placed in this encounter.   Signed, Lamar DOROTHA Fitch, MD, [facility] 11/29/2023 3:32 PM    [facility]

## 2023-11-29 NOTE — Patient Instructions (Signed)

## 2023-12-02 ENCOUNTER — Ambulatory Visit: Payer: Self-pay | Admitting: *Deleted

## 2023-12-02 ENCOUNTER — Other Ambulatory Visit: Payer: Self-pay | Admitting: Physician Assistant

## 2023-12-02 DIAGNOSIS — M109 Gout, unspecified: Secondary | ICD-10-CM

## 2023-12-02 MED ORDER — COLCHICINE 0.6 MG PO TABS: 0.6000 mg | ORAL_TABLET | Freq: Two times a day (BID) | ORAL | 0 refills | Status: DC

## 2023-12-02 NOTE — Telephone Encounter (Signed)
 FYI Only or Action Required?: Action required by provider: medication refill request and update on patient condition. Please advise if increase dose or taking medication colchicine  2 times daily will help pain/ swelling.   Patient was last seen in primary care on 11/19/2023 by [name], PA.  Called Nurse Triage reporting Joint Swelling.  Symptoms began several weeks ago.  Interventions attempted: Prescription medications: colchincine.  Symptoms are: gradually worsening.  Triage Disposition: See PCP When Office is Open (Within 3 Days)  Patient/caregiver understands and will follow disposition?: No, wishes to speak with PCP            Copied from CRM [id number]. Topic: Clinical - Red Word Triage >> Dec 02, 2023  8:33 AM Rosina BIRCH wrote: Red Word that prompted transfer to Nurse Triage: patient son is calling because the patient has selling his feet due to gout and the medication is not working. Patient son does not know if he needs to go to two a day if so he need a refill Reason for Disposition  MODERATE ankle swelling (e.g., interferes with normal activities, can't move joint normally) (Exceptions: Itchy, localized swelling; swelling is chronic.)  Answer Assessment - Initial Assessment Questions Right ankle swelling redness not getting better. Son requesting if colchicine  should be given 2 times daily or increase dose . Please advise and patient needs refill of medication . Last OV 11/19/23 for same issues.  Son requesting call back       1. LOCATION: Which ankle is swollen? Where is the swelling?     Right ankle  2. ONSET: When did the swelling start?     Since 11/19/23 3. SWELLING: How bad is the swelling? Or, How large is it? (e.g., mild, moderate, severe; size of localized swelling)      Moderate with redness  4. PAIN: Is there any pain? If Yes, ask: How bad is it? (Scale 0-10; or none, mild, moderate, severe)     6-7/10 5. CAUSE: What do you think  caused the ankle swelling?     Dx gout 6. OTHER SYMPTOMS: Do you have any other symptoms? (e.g., fever, chest pain, difficulty breathing, calf pain)     Redness to right ankle and swelling difficulty walking , right fingertips swelling as well 7. PREGNANCY: Is there any chance you are pregnant? When was your last menstrual period?     na  Protocols used: Ankle Swelling-A-AH

## 2023-12-05 ENCOUNTER — Other Ambulatory Visit: Payer: Self-pay

## 2023-12-05 DIAGNOSIS — R42 Dizziness and giddiness: Secondary | ICD-10-CM

## 2023-12-05 MED ORDER — MECLIZINE HCL 50 MG PO TABS: 50.0000 mg | ORAL_TABLET | Freq: Two times a day (BID) | ORAL | 0 refills | Status: DC | PRN

## 2023-12-05 MED ORDER — PREGABALIN 75 MG PO CAPS: 75.0000 mg | ORAL_CAPSULE | Freq: Two times a day (BID) | ORAL | 2 refills | Status: AC

## 2023-12-05 NOTE — Telephone Encounter (Signed)
Spoke with patient, verbalized understanding and had no questions at this time.  

## 2023-12-09 NOTE — Progress Notes (Signed)
   12/09/2023  Patient ID: Jack Hodges, male   DOB: 05/07/1930, 89 y.o.   MRN: [id number]  Pharmacy Quality Measure Review  This patient is appearing on a report for being at risk of failing the adherence measure for hypertension (ACEi/ARB) medications this calendar year.   Medication: olmesartan  40mg . Last fill date: 08/09/2023 for 90 day supply. Sold 08/13/2023. Discontinued d/t dizziness, only filled once this year.   No action at this time.    Lang Sieve, PharmD, BCGP Clinical Pharmacist  [phone]

## 2023-12-11 ENCOUNTER — Telehealth: Payer: Self-pay

## 2023-12-11 NOTE — Patient Instructions (Signed)
 Rawson W Hollister - I am sorry I was unable to reach you today for our scheduled appointment. I work with [name], PA and am calling to support your healthcare needs. Please contact me at [phone] at your earliest convenience. I look forward to speaking with you soon.   Thank you,   Heddy Shutter, RN, MSN, BSN, CCM [place]  [facility], Population Health Case Manager Phone: [phone]

## 2023-12-17 ENCOUNTER — Ambulatory Visit: Admitting: Physician Assistant

## 2023-12-18 ENCOUNTER — Telehealth: Payer: Self-pay | Admitting: Physician Assistant

## 2023-12-18 NOTE — Telephone Encounter (Signed)
 [facility] [phone]

## 2023-12-19 ENCOUNTER — Other Ambulatory Visit: Payer: Self-pay

## 2023-12-19 NOTE — Patient Outreach (Signed)
 Complex Care Management   Visit Note  12/19/2023  Name:  Jack Hodges MRN: [id number] DOB: 17-Oct-1930  Situation: Referral received for Complex Care Management related to Heart Failure, Dementia, and HTN I obtained verbal consent from Patient.  Visit completed with Patient  on the phone  Background:   Past Medical History:  Diagnosis Date   Abnormal CBC 06/13/2023   Acute bursitis of left shoulder 01/04/2020   Acute gout of right ankle 11/23/2023   Acute on chronic diastolic congestive heart failure (HCC) 11/13/2021   Last Assessment & Plan:  Formatting of this note is different from the original. Pertinent Data:   Current medication includes: furosemide  - 20 mg lisinopriL  - 10 mg metoprolol  succinate - 25 mg.  BNP (pg/mL)  Date Value  11/15/2021 433.6 (H)   eGFR (mL/min/1.60m*2)  Date Value  11/15/2021 >60.0    BP Readings from Last 3 Encounters:  11/30/21 (!) 147/82  11/15/21 (!) 109/54  11/13/21 (!) 134/74     Acute pharyngitis due to other specified organisms 03/31/2022   Acute right ankle pain 11/10/2023   Anemia of chronic disease 11/10/2023   Angina pectoris 10/07/2019   Arthritis    At high risk for malnutrition 11/23/2023   Atherosclerosis of aorta 09/24/2019   Bilateral carpal tunnel syndrome 06/13/2023   Bilateral foot-drop 01/18/2017   BMI 27.0-27.9,adult 09/24/2019   Bowel and bladder incontinence 11/23/2023   Chronic low back pain with sciatica 09/10/2023   Chronic pain syndrome 11/23/2023   Closed nondisplaced fracture of phalanx of right middle finger with routine healing 11/23/2023   Congestive heart failure (HCC) 10/16/2023   Constipation 05/01/2023   Coronary artery disease involving native coronary artery of native heart without angina pectoris 11/01/2015   Overview:  Bypass surgery in 1996 Overview:  Overview:  Bypass surgery in 1996  Last Assessment & Plan:  Continue his current regimen.  Follow up as noted.   CVA (cerebral vascular accident) (HCC) 04/03/2016    Last Assessment & Plan:  The patient underwent extensive CVA workup.  MRI is negative.  He does have aortic stenosis by echocardiogram.  Upon review of Care everywhere he is followed by Dr. Bernie, cardiologist for this.  Carotid ultrasound negative for hemodynamically significant stenosis.  Lipid panel within normal limits.  He has not had any further episodes of dizziness or nausea vomiting.    Demand ischemia (HCC) 11/14/2021   Last Assessment & Plan:  Formatting of this note might be different from the original. Troponin flat No anginal symptoms Formatting of this note might be different from the original. Last Assessment & Plan:  Formatting of this note might be different from the original. Troponin flat No anginal symptoms   Dementia (HCC)    Depression    Diarrhea 11/23/2023   DNR (do not resuscitate) 04/03/2016   Overview:  I had an extensive discussion with the patient on 04/03/2016 regarding his end of life wishes.  He and his daughter agree that do not resuscitate is in keeping with his beliefs.   Dyslipidemia 11/01/2015   Last Assessment & Plan:  Formatting of this note might be different from the original. Cholesterol 136   Triglycerides  83  HDL  50.1  LDL Calculated  69  VLDL Cholesterol Cal  17   Continue statin.   Dyspnea    with activity   Essential hypertension 11/01/2015   Last Assessment & Plan:  Resume home medications.  Follow-up as noted above.   Gait abnormality 09/10/2023  Gastric ulcer without hemorrhage or perforation 07/04/2023   Generalized weakness 03/13/2020   GERD (gastroesophageal reflux disease)    H/O diplopia 05/08/2023   Herpes zoster 05/19/2019   History of aortic stenosis 03/11/2020   History of CVA (cerebrovascular accident) 11/14/2021   Last Assessment & Plan:  Formatting of this note might be different from the original. Neurologically intact upon assessment -continue aspirin , statin   History of stomach ulcers 10/16/2023   Hypertensive heart  disease with heart failure (HCC) 11/01/2015   Last Assessment & Plan:  Resume home medications.  Follow-up as noted above.   Inflammation of right hand joint 11/10/2023   Insomnia 05/01/2023   Laceration of skin of right wrist 02/13/2022   Melena 06/12/2023   Mitral regurgitation 01/26/2022   Myocardial infarction [facility])    approx 1994   Neck pain 07/20/2023   Neuropathy    Peripheral neuropathy 11/14/2021   Last Assessment & Plan:  Formatting of this note might be different from the original. Bilateral lower extremity cool to touch with diminished sensation Patient reports findings are chronic Continue home dose Lyrica  Formatting of this note might be different from the original. Last Assessment & Plan:  Formatting of this note might be different from the original. 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   Thrombocytopenia 03/13/2020   TIA (transient ischemic attack) 03/30/2023   Trochanteric bursitis of left hip 01/19/2021   Vertigo 04/19/2016   Weight loss, unintentional 06/13/2023    Assessment: son reports fall 12/13/23 patient leaning over to pick up a pill and fell onto dresser-abrasion to scalp line right side. No loss of consciousness, denies headaches or change in behvior Patient Reported Symptoms:  Cognitive Cognitive Status: Able to follow simple commands (reports  dementia)      Neurological Neurological Review of Symptoms: Dizziness (history of vertigo)    HEENT HEENT Symptoms Reported: No symptoms reported      Cardiovascular Cardiovascular Symptoms Reported: No symptoms reported Does patient have uncontrolled Hypertension?: No    Respiratory Respiratory Symptoms Reported: No symptoms reported    Endocrine Endocrine Symptoms Reported: No symptoms reported Is patient diabetic?: No    Gastrointestinal Gastrointestinal Symptoms Reported: No symptoms reported Additional Gastrointestinal Details: son reports soft stool      Genitourinary Genitourinary Symptoms Reported: No symptoms reported    Integumentary Integumentary Symptoms Reported: No symptoms reported    Musculoskeletal Musculoskelatal Symptoms Reviewed: Weakness, Unsteady gait Additional Musculoskeletal Details: abrasion to scalp line to to fall. Son reports paitent uses a walker. Active with home health PT at this time. Musculoskeletal Management Strategies: Routine screening, Medical device, Medication therapy Musculoskeletal Self-Management Outcome: 3 (uncertain) Falls in the past year?: Yes Number of falls in past year: 2 or more Was there an injury with Fall?: Yes (hit head) Fall Risk Category Calculator: 3 Patient Fall Risk Level: High Fall Risk Patient at Risk for Falls Due  to: History of fall(s), Impaired balance/gait  Psychosocial Psychosocial Symptoms Reported: Not assessed (patient not available)         There were no vitals filed for this visit.  Medications Reviewed Today     Reviewed by Jaclyn Andy M, RN (Registered Nurse) on 12/19/23 at 1432  Med List Status: <None>   Medication Order Taking? Sig Documenting Provider Last Dose Status Informant  acetaminophen  (TYLENOL ) 650 MG CR tablet [id number] Yes Take 650 mg by mouth every 8 (eight) hours as needed for pain.  [provider]  Active Self  albuterol  (VENTOLIN  HFA) 108 (90 Base) MCG/ACT inhaler  [id number] Yes Inhale 2 puffs into the lungs every 2 (two) hours as needed for wheezing or shortness of breath. [name], PA  Active   ammonium lactate  (LAC-HYDRIN ) 12 % lotion [id number] Yes Apply 1 Application topically 2 (two) times daily. Apply twice daily to arms and hands to increase moisture and reduce easy bruising.  Can also be used safely on neck, chest, and legs as needed Cox, Kirsten, MD  Active   amoxicillin  (AMOXIL ) 500 MG capsule [id number]  Take 4 capsules one hour before dentist visit. [name], PA  Active   ascorbic acid  (VITAMIN C) 500 MG tablet [id number] Yes Take 1 tablet (500 mg total) by mouth daily. Milissa Tod PARAS, MD  Active   carvedilol  (COREG ) 12.5 MG tablet [id number] Yes Take 12.5 mg by mouth 2 (two) times daily. [provider]  Active   cholecalciferol  (VITAMIN D3) 25 MCG (1000 UNIT) tablet [id number] Yes Take 1 tablet (1,000 Units total) by mouth daily. Sherre Clapper, MD  Active   colchicine  0.6 MG tablet [id number] Yes Take 1 tablet (0.6 mg total) by mouth 2 (two) times daily. [name], PA  Active   ELIQUIS  5 MG TABS tablet [id number] Yes Take 5 mg by mouth 2 (two) times daily. [provider]  Active   ezetimibe  (ZETIA ) 10 MG tablet [id number] Yes Take 1 tablet (10 mg total) by mouth daily. Cox, Kirsten, MD  Active   furosemide  (LASIX ) 20 MG tablet [id number] Yes Take 40 mg by mouth daily. [provider]  Active   meclizine  (ANTIVERT ) 50 MG tablet [id number] Yes Take 1 tablet (50 mg total) by mouth 2 (two) times daily as needed. Sirivol, Mamatha, MD  Active   nitroGLYCERIN  (NITROSTAT ) 0.4 MG SL tablet [id number] Yes Place 1 tablet (0.4 mg total) under the tongue every 5 (five) minutes as needed for chest pain. Goodrich, Callie E, PA-C  Active Self  pantoprazole  (PROTONIX ) 40 MG tablet [id number] Yes Take 1 tablet (40 mg total) by mouth daily. [name], PA  Active   potassium chloride  (KLOR-CON ) 10 MEQ tablet [id number] Yes Take 1 tablet (10  mEq total) by mouth daily. Krasowski, Robert J, MD  Active   pregabalin  (LYRICA ) 75 MG capsule [id number] Yes Take 1 capsule (75 mg total) by mouth 2 (two) times daily. Sirivol, Mamatha, MD  Active      Discontinued 08/08/22 1656 (Entry Error)   rosuvastatin  (CRESTOR ) 20 MG tablet [id number] Yes Take 1 tablet (20 mg total) by mouth at bedtime. Abran Jerilynn Loving, MD  Active   sertraline  (ZOLOFT ) 50 MG tablet [id number] Yes TAKE ONE TABLET BY MOUTH ONCE DAILY [name], [place]  Active   traMADol  (ULTRAM ) 50 MG tablet [id number] Yes Take 1 tablet (50 mg total) by mouth 2 (two) times daily as needed. [name], PA  Active   vitamin B-12 (CYANOCOBALAMIN ) 500 MCG tablet  [id number] Yes Take 500 mcg by mouth daily. [provider]  Active   zinc  sulfate 220 (50 Zn) MG capsule [id number] Yes Take 1 capsule (220 mg total) by mouth daily. Sherre Clapper, MD  Active           Recommendation:   Continue Current Plan of Care  Follow Up Plan:   Telephone follow up appointment date/time:  01/13/24 at 2:00 pm  Heddy Shutter, RN, MSN, BSN, CCM   [facility], Population Health Case Manager Phone: [phone]

## 2023-12-19 NOTE — Patient Instructions (Signed)
 Visit Information  Thank you for taking time to visit with me today. Please don't hesitate to contact me if I can be of assistance to you before our next scheduled appointment.  Your next care management appointment is by telephone on 01/13/24 at 2:00 pm  Please call the care guide team at [phone] if you need to cancel, schedule, or reschedule an appointment.   Please call the Suicide and Crisis Lifeline: 988 call the [place]  National Suicide Prevention Lifeline: 5044833667 or TTY: 4354621841 TTY (762)190-2613) to talk to a trained counselor call 1-800-273-TALK (toll free, 24 hour hotline) if you are experiencing a Mental Health or Behavioral Health Crisis or need someone to talk to.  Heddy Shutter, RN, MSN, BSN, CCM [place]  [facility], Population Health Case Manager Phone: [phone]

## 2023-12-23 ENCOUNTER — Ambulatory Visit: Payer: Self-pay

## 2023-12-23 ENCOUNTER — Ambulatory Visit

## 2023-12-23 VITALS — HR 69 | Temp 97.7°F | Ht 66.0 in | Wt 131.0 lb

## 2023-12-23 DIAGNOSIS — S61411A Laceration without foreign body of right hand, initial encounter: Secondary | ICD-10-CM | POA: Diagnosis not present

## 2023-12-23 DIAGNOSIS — H938X3 Other specified disorders of ear, bilateral: Secondary | ICD-10-CM | POA: Insufficient documentation

## 2023-12-23 DIAGNOSIS — Z23 Encounter for immunization: Secondary | ICD-10-CM

## 2023-12-23 DIAGNOSIS — R1310 Dysphagia, unspecified: Secondary | ICD-10-CM | POA: Diagnosis not present

## 2023-12-23 DIAGNOSIS — R634 Abnormal weight loss: Secondary | ICD-10-CM
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
## 2023-12-23 NOTE — Progress Notes (Unsigned)
 Acute Office Visit  Subjective:    Patient ID: Jack Hodges, male    DOB: 01-03-1931, 89 y.o.   MRN: [id number]  Chief Complaint  Patient presents with   Fall   Hand Injury    HPI:   Discussed the use of AI scribe software for clinical note transcription with the patient, who gave verbal consent to proceed.   History of Present Illness   Jack Hodges is a 89 year old male who presents with a fall resulting in head and tailbone injury. He is accompanied by his son who is his caregiver.  Fall and traumatic injuries - Sustained a fall on Sunday night IE LAST NIGHT, falling backwards and striking head and tailbone - No loss of consciousness - Assisted to standing by others present - Laceration on right hand with significant bleeding, no immediate medical attention sought - Pain in lower back and buttock region - No head or neck pain - Multiple recent falls, including two in the last week and a half  Musculoskeletal pain and deformities - History of gout and arthritis - Deformed right middle fingertip - Spots present on right elbow and left hand - Current pain in right forearm - Arthritis in the neck  Generalized weakness and mobility - Weakness when standing, attributed to recent physical therapy  Dysphagia and weight loss - Difficulty eating for several weeks - Prolonged chewing with inability to swallow effectively - No choking on food - Significant weight loss from 154 pounds to 131 pounds since November 19, 2023  Neurological and sensory symptoms - Sensation of 'rivers running in my head' - Ears feel 'stuffed up' - History of dry wax accumulation in ears - No recent use of ear drops  Infection and hematologic abnormalities - History of high white cell count on November 01, 2023 - Treated with antibiotics for swollen foot and finger, suspected infection  Vascular access difficulties - Difficulty obtaining blood pressure reading on right arm due to previous  stroke         Past Medical History:  Diagnosis Date   Abnormal CBC 06/13/2023   Acute bursitis of left shoulder 01/04/2020   Acute gout of right ankle 11/23/2023   Acute on chronic diastolic congestive heart failure (HCC) 11/13/2021   Last Assessment & Plan:  Formatting of this note is different from the original. Pertinent Data:   Current medication includes: furosemide  - 20 mg lisinopriL  - 10 mg metoprolol  succinate - 25 mg.  BNP (pg/mL)  Date Value  11/15/2021 433.6 (H)   eGFR (mL/min/1.71m*2)  Date Value  11/15/2021 >60.0    BP Readings from Last 3 Encounters:  11/30/21 (!) 147/82  11/15/21 (!) 109/54  11/13/21 (!) 134/74     Acute pharyngitis due to other specified organisms 03/31/2022   Acute right ankle pain 11/10/2023   Anemia of chronic disease 11/10/2023   Angina pectoris 10/07/2019   Arthritis    At high risk for malnutrition 11/23/2023   Atherosclerosis of aorta 09/24/2019   Bilateral carpal tunnel syndrome 06/13/2023   Bilateral foot-drop 01/18/2017   BMI 27.0-27.9,adult 09/24/2019   Bowel and bladder incontinence 11/23/2023   Chronic low back pain with sciatica 09/10/2023   Chronic pain syndrome 11/23/2023   Closed nondisplaced fracture of phalanx of right middle finger with routine healing 11/23/2023   Congestive heart failure (HCC) 10/16/2023   Constipation 05/01/2023   Coronary artery disease involving native coronary artery of native heart without angina pectoris 11/01/2015   Overview:  Bypass surgery in 1996 Overview:  Overview:  Bypass surgery in 1996  Last Assessment & Plan:  Continue his current regimen.  Follow up as noted.   CVA (cerebral vascular accident) (HCC) 04/03/2016   Last Assessment & Plan:  The patient underwent extensive CVA workup.  MRI is negative.  He does have aortic stenosis by echocardiogram.  Upon review of Care everywhere he is followed by Dr. Bernie, cardiologist for this.  Carotid ultrasound negative for hemodynamically significant  stenosis.  Lipid panel within normal limits.  He has not had any further episodes of dizziness or nausea vomiting.    Demand ischemia (HCC) 11/14/2021   Last Assessment & Plan:  Formatting of this note might be different from the original. Troponin flat No anginal symptoms Formatting of this note might be different from the original. Last Assessment & Plan:  Formatting of this note might be different from the original. Troponin flat No anginal symptoms   Dementia (HCC)    Depression    Diarrhea 11/23/2023   DNR (do not resuscitate) 04/03/2016   Overview:  I had an extensive discussion with the patient on 04/03/2016 regarding his end of life wishes.  He and his daughter agree that do not resuscitate is in keeping with his beliefs.   Dyslipidemia 11/01/2015   Last Assessment & Plan:  Formatting of this note might be different from the original. Cholesterol 136   Triglycerides  83  HDL  50.1  LDL Calculated  69  VLDL Cholesterol Cal  17   Continue statin.   Dyspnea    with activity   Essential hypertension 11/01/2015   Last Assessment & Plan:  Resume home medications.  Follow-up as noted above.   Gait abnormality 09/10/2023   Gastric ulcer without hemorrhage or perforation 07/04/2023   Generalized weakness 03/13/2020   GERD (gastroesophageal reflux disease)    H/O diplopia 05/08/2023   Herpes zoster 05/19/2019   History of aortic stenosis 03/11/2020   History of CVA (cerebrovascular accident) 11/14/2021   Last Assessment & Plan:  Formatting of this note might be different from the original. Neurologically intact upon assessment -continue aspirin , statin   History of stomach ulcers 10/16/2023   Hypertensive heart disease with heart failure (HCC) 11/01/2015   Last Assessment & Plan:  Resume home medications.  Follow-up as noted above.   Inflammation of right hand joint 11/10/2023   Insomnia 05/01/2023   Laceration of skin of right wrist 02/13/2022   Melena 06/12/2023   Mitral regurgitation  01/26/2022   Myocardial infarction [facility])    approx 1994   Neck pain 07/20/2023   Neuropathy    Peripheral neuropathy 11/14/2021   Last Assessment & Plan:  Formatting of this note might be different from the original. Bilateral lower extremity cool to touch with diminished sensation Patient reports findings are chronic Continue home dose Lyrica  Formatting of this note might be different from the original. Last Assessment & Plan:  Formatting of this note might be different from the original. 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   Thrombocytopenia 03/13/2020   TIA (transient ischemic attack) 03/30/2023   Trochanteric bursitis of left hip 01/19/2021   Vertigo 04/19/2016   Weight loss, unintentional 06/13/2023    Past Surgical History:  Procedure Laterality Date   CARDIAC CATHETERIZATION     CARDIAC SURGERY     CATARACT EXTRACTION, BILATERAL     COLONOSCOPY     [facility]   COLONOSCOPY N/A 07/04/2023   Procedure: COLONOSCOPY;  Surgeon: Stacia Glendia BRAVO, MD;  Location: [facility] ENDOSCOPY;  Service: Gastroenterology;  Laterality: N/A;   CORONARY ARTERY BYPASS GRAFT     CORONARY STENT INTERVENTION N/A 10/07/2019   Procedure: CORONARY STENT INTERVENTION;  Surgeon: Wonda Sharper, MD;  Location: [facility] INVASIVE CV LAB;  Service: Cardiovascular;  Laterality: N/A;    ESOPHAGOGASTRODUODENOSCOPY N/A 07/04/2023   Procedure: EGD (ESOPHAGOGASTRODUODENOSCOPY);  Surgeon: Stacia Glendia BRAVO, MD;  Location: [facility] ENDOSCOPY;  Service: Gastroenterology;  Laterality: N/A;   EYE SURGERY     HEMORROIDECTOMY     RIGHT/LEFT HEART CATH AND CORONARY/GRAFT ANGIOGRAPHY N/A 10/07/2019   Procedure: RIGHT/LEFT HEART CATH AND CORONARY/GRAFT ANGIOGRAPHY;  Surgeon: Wonda Sharper, MD;  Location: [facility] INVASIVE CV LAB;  Service: Cardiovascular;  Laterality: N/A;   TEE WITHOUT CARDIOVERSION N/A 10/27/2019   Procedure: TRANSESOPHAGEAL ECHOCARDIOGRAM (TEE);  Surgeon: Verlin Lonni BIRCH, MD;  Location: [facility] INVASIVE CV LAB;  Service: Open Heart Surgery;  Laterality: N/A;   TRANSCATHETER AORTIC VALVE REPLACEMENT, TRANSFEMORAL N/A 10/27/2019   Procedure: TRANSCATHETER AORTIC VALVE REPLACEMENT, TRANSFEMORAL;  Surgeon: Verlin Lonni BIRCH, MD;  Location: [facility] INVASIVE CV LAB;  Service: Open Heart Surgery;  Laterality: N/A;    Family History  Problem Relation Age of Onset   Cancer Mother        bone   Diabetes Mother    Kidney disease Mother    Stroke Mother    Sleep disorder Mother    Arthritis Father    Migraines Father    Hypertension Father    Sleep disorder Father    Arthritis Brother    Cancer Brother        throat   Throat cancer Daughter     Social History   Socioeconomic History   Marital status: Widowed    Spouse name: Juanita   Number of children: 2   Years of education: Not on file   Highest education level: 7th grade  Occupational History   Occupation: Retired    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  He has one son that lives with him and one daughter that lives beside of him. He is close with his family.  His sister cooks meals for him regularly.  He has friends at the cafe he visits regularly.   Social Drivers of Health   Financial Resource Strain: Low Risk  (03/07/2023)   Overall  Financial Resource Strain (CARDIA)    Difficulty of Paying Living Expenses: Not hard at all  Food Insecurity: No Food Insecurity (11/06/2023)   Hunger Vital Sign    Worried About Running Out of Food in the Last Year: Never true    Ran Out of Food in the Last Year: Never true  Transportation Needs: No Transportation Needs (11/06/2023)   PRAPARE - Administrator, Civil Service (Medical): No    Lack of Transportation (Non-Medical): No  Physical Activity: Insufficiently Active (03/07/2023)   Exercise Vital Sign    Days of Exercise per Week: 2 days    Minutes of Exercise per Session: 10 min  Stress: No Stress Concern Present (03/07/2023)   [employer] of Occupational Health - Occupational Stress Questionnaire    Feeling of Stress : Not at all  Social Connections: Moderately Isolated (03/07/2023)   Social Connection and Isolation Panel    Frequency of Communication with Friends and Family: More than three times a week    Frequency of Social Gatherings with Friends and Family: More than three times a week    Attends Religious Services: 1 to 4 times per year    Active Member of [employer] or Organizations: No    Attends Banker Meetings: Never    Marital Status: Widowed  Intimate Partner Violence: Not At Risk (11/06/2023)   Humiliation, Afraid, Rape, and Kick questionnaire    Fear of Current or Ex-Partner: No    Emotionally Abused: No    Physically Abused: No    Sexually Abused: No    Outpatient Medications Prior to Visit  Medication Sig Dispense Refill   acetaminophen  (TYLENOL ) 650  MG CR tablet Take 650 mg by mouth every 8 (eight) hours as needed for pain.      albuterol  (VENTOLIN  HFA) 108 (90 Base) MCG/ACT inhaler Inhale 2 puffs into the lungs every 2 (two) hours as needed for wheezing or shortness of breath. 6.7 g 6   ammonium lactate  (LAC-HYDRIN ) 12 % lotion Apply 1 Application topically 2 (two) times daily. Apply twice daily to arms and hands to increase moisture and reduce easy bruising.  Can also be used safely on neck, chest, and legs as needed 400 g 0   carvedilol  (COREG ) 12.5 MG tablet Take 12.5 mg by mouth 2 (two) times daily.     colchicine  0.6 MG tablet Take 1 tablet (0.6 mg total) by mouth 2 (two) times daily. 30 tablet 0   ELIQUIS  5 MG TABS tablet Take 5 mg by mouth 2 (two) times daily.     ezetimibe  (ZETIA ) 10 MG tablet Take 1 tablet (10 mg total) by mouth daily. 90 tablet 1   furosemide  (LASIX ) 20 MG tablet Take 40 mg by mouth daily.     meclizine  (ANTIVERT ) 25 MG tablet Take 50 mg by mouth 2 (two) times daily as needed.     nitroGLYCERIN  (NITROSTAT ) 0.4 MG SL tablet Place 1 tablet (0.4 mg total) under the tongue every 5 (five) minutes as needed for chest pain. 25 tablet 2   pantoprazole  (PROTONIX ) 40 MG tablet Take 1 tablet (40 mg total) by mouth daily. 100 tablet 1   potassium chloride  (KLOR-CON ) 10 MEQ tablet Take 1 tablet (10 mEq total) by mouth daily. 90 tablet 3   pregabalin  (LYRICA ) 75 MG capsule Take 1 capsule (75 mg total) by mouth 2 (two) times daily. 60 capsule 2   rosuvastatin  (CRESTOR ) 20 MG tablet Take 1 tablet (20 mg total) by mouth  at bedtime. 90 tablet 3   sertraline  (ZOLOFT ) 50 MG tablet TAKE ONE TABLET BY MOUTH ONCE DAILY 90 tablet 1   traMADol  (ULTRAM ) 50 MG tablet Take 1 tablet (50 mg total) by mouth 2 (two) times daily as needed. 60 tablet 0   vitamin B-12 (CYANOCOBALAMIN ) 500 MCG tablet Take 500 mcg by mouth daily.     cholecalciferol  (VITAMIN D3) 25 MCG (1000 UNIT) tablet Take 1 tablet (1,000 Units total) by mouth daily. 90 tablet 1    meclizine  (ANTIVERT ) 50 MG tablet Take 1 tablet (50 mg total) by mouth 2 (two) times daily as needed. 60 tablet 0   zinc  sulfate 220 (50 Zn) MG capsule Take 1 capsule (220 mg total) by mouth daily. 90 capsule 1   amoxicillin  (AMOXIL ) 500 MG capsule Take 4 capsules one hour before dentist visit. (Patient not taking: Reported on 12/23/2023) 30 capsule 0   ascorbic acid  (VITAMIN C) 500 MG tablet Take 1 tablet (500 mg total) by mouth daily. 30 tablet 0   QUEtiapine  (SEROQUEL ) 25 MG tablet Take 1 tablet (25 mg total) by mouth at bedtime. 15 tablet 10   No facility-administered medications prior to visit.    Allergies  Allergen Reactions   Poison Oak Extract Rash    Review of Systems  Constitutional:  Positive for appetite change and unexpected weight change. Negative for chills, fatigue and fever.  HENT:  Negative for congestion, ear pain, sinus pressure and sore throat.   Respiratory:  Negative for cough and shortness of breath.   Cardiovascular:  Negative for chest pain.  Gastrointestinal:  Negative for abdominal pain, constipation, diarrhea, nausea and vomiting.       Dysphagia  Genitourinary:  Negative for dysuria and frequency.  Musculoskeletal:  Negative for arthralgias, back pain and myalgias.       Fall  Skin:  Positive for wound.  Neurological:  Negative for dizziness and headaches.  Hematological:  Bruises/bleeds easily.  Psychiatric/Behavioral:  Negative for dysphoric mood. The patient is not nervous/anxious.        Objective:        12/23/2023    1:37 PM 11/29/2023    3:15 PM 11/19/2023    2:29 PM  Vitals with BMI  Height 5' 6 5' 6 5' 6  Weight 131 lbs 137 lbs 11 oz 154 lbs  BMI 21.15 22.24 24.87  Systolic  136 144  Diastolic  80 68  Pulse 69 74 75    No data found.   Physical Exam Vitals and nursing note reviewed.  Constitutional:      Comments: Thin built male, appears weak  HENT:     Head: Normocephalic and atraumatic.  Eyes:     Pupils: Pupils are  equal, round, and reactive to light.  Cardiovascular:     Rate and Rhythm: Normal rate and regular rhythm.  Pulmonary:     Effort: Pulmonary effort is normal.     Breath sounds: Normal breath sounds.  Musculoskeletal:        General: Signs of injury (laceration dorsum of right hand, skin tear dorsum of left hand) present.     Cervical back: Normal range of motion.  Neurological:     Mental Status: He is alert.     Health Maintenance Due  Topic Date Due   Influenza Vaccine  10/25/2023    There are no preventive care reminders to display for this patient.   No results found for: TSH Lab Results  Component Value Date  WBC 17.7 (H) 11/01/2023   HGB 11.4 (L) 11/01/2023   HCT 36.1 (L) 11/01/2023   MCV 94 11/01/2023   PLT 271 11/01/2023   Lab Results  Component Value Date   NA 144 11/01/2023   K 3.5 11/01/2023   CO2 18 (L) 11/01/2023   GLUCOSE 140 (H) 11/01/2023   BUN 19 11/01/2023   CREATININE 1.11 11/01/2023   BILITOT 1.5 (H) 11/01/2023   ALKPHOS 162 (H) 11/01/2023   AST 20 11/01/2023   ALT 9 11/01/2023   PROT 5.9 (L) 11/01/2023   ALBUMIN 4.0 11/01/2023   CALCIUM  9.1 11/01/2023   ANIONGAP 14 09/30/2023   EGFR 63 11/01/2023   Lab Results  Component Value Date   CHOL 106 05/08/2023   Lab Results  Component Value Date   HDL 27 (L) 05/08/2023   Lab Results  Component Value Date   LDLCALC 58 05/08/2023   Lab Results  Component Value Date   TRIG 112 05/08/2023   Lab Results  Component Value Date   CHOLHDL 3.9 05/08/2023   Lab Results  Component Value Date   HGBA1C 5.3 05/08/2023        Results for orders placed or performed in visit on 11/01/23  CBC with Differential/Platelet   Collection Time: 11/01/23  9:18 AM  Result Value Ref Range   WBC 17.7 (H) 3.4 - 10.8 x10E3/uL   RBC 3.83 (L) 4.14 - 5.80 x10E6/uL   Hemoglobin 11.4 (L) 13.0 - 17.7 g/dL   Hematocrit 63.8 (L) 62.4 - 51.0 %   MCV 94 79 - 97 fL   MCH 29.8 26.6 - 33.0 pg   MCHC 31.6  31.5 - 35.7 g/dL   RDW 83.0 (H) 88.3 - 84.5 %   Platelets 271 150 - 450 x10E3/uL   Neutrophils 85 Not Estab. %   Lymphs 6 Not Estab. %   Monocytes 5 Not Estab. %   Eos 2 Not Estab. %   Basos 1 Not Estab. %   Neutrophils Absolute 15.2 (H) 1.4 - 7.0 x10E3/uL   Lymphocytes Absolute 1.0 0.7 - 3.1 x10E3/uL   Monocytes Absolute 0.8 0.1 - 0.9 x10E3/uL   EOS (ABSOLUTE) 0.3 0.0 - 0.4 x10E3/uL   Basophils Absolute 0.1 0.0 - 0.2 x10E3/uL   Immature Granulocytes 1 Not Estab. %   Immature Grans (Abs) 0.2 (H) 0.0 - 0.1 x10E3/uL  Comprehensive metabolic panel with GFR   Collection Time: 11/01/23  9:18 AM  Result Value Ref Range   Glucose 140 (H) 70 - 99 mg/dL   BUN 19 10 - 36 mg/dL   Creatinine, Ser 8.88 0.76 - 1.27 mg/dL   eGFR 63 >40 fO/fpw/8.26   BUN/Creatinine Ratio 17 10 - 24   Sodium 144 134 - 144 mmol/L   Potassium 3.5 3.5 - 5.2 mmol/L   Chloride 109 (H) 96 - 106 mmol/L   CO2 18 (L) 20 - 29 mmol/L   Calcium  9.1 8.6 - 10.2 mg/dL   Total Protein 5.9 (L) 6.0 - 8.5 g/dL   Albumin 4.0 3.6 - 4.6 g/dL   Globulin, Total 1.9 1.5 - 4.5 g/dL   Bilirubin Total 1.5 (H) 0.0 - 1.2 mg/dL   Alkaline Phosphatase 162 (H) 44 - 121 IU/L   AST 20 0 - 40 IU/L   ALT 9 0 - 44 IU/L  Sedimentation rate   Collection Time: 11/01/23  9:18 AM  Result Value Ref Range   Sed Rate 2 0 - 30 mm/hr  C-reactive protein   Collection  Time: 11/01/23  9:18 AM  Result Value Ref Range   CRP 44 (H) 0 - 10 mg/L  Iron, TIBC and Ferritin Panel   Collection Time: 11/01/23  9:18 AM  Result Value Ref Range   Total Iron Binding Capacity 239 (L) 250 - 450 ug/dL   UIBC 784 888 - 656 ug/dL   Iron 24 (L) 38 - 830 ug/dL   Iron Saturation 10 (L) 15 - 55 %   Ferritin 431 (H) 30 - 400 ng/mL  Uric acid   Collection Time: 11/01/23  9:18 AM  Result Value Ref Range   Uric Acid 10.5 (H) 3.8 - 8.4 mg/dL  Anaerobic and Aerobic Culture   Collection Time: 11/01/23  9:27 AM   FI  Result Value Ref Range   Anaerobic Culture Final report     Result 1 Comment    Aerobic Culture Final report (A)    Result 1 Comment (A)    Result 2 Not applicable    Result 3 Not applicable    Antimicrobial Susceptibility Comment      Assessment & Plan:   Assessment & Plan Weight loss, unintentional Has lost 23 pounds since his visit on 11/19/23 which was just a month ago. THIS IS SIGNIFICANT WEIGHT LOSS. He does Report DYSPHAGIA and trouble swallowing for several days. Advised that I will order labs to further evaluate his unintentional weight loss. He has an appt with his PCP Nola on 12/26/23.  Orders:   CBC with Differential/Platelet   Comprehensive metabolic panel with GFR   Sedimentation rate   TSH   C-reactive protein  Laceration of skin of right hand, initial encounter Fall with right hand laceration and right buttock contusion Sustained a fall on Sunday night, resulting in a laceration on the right hand and a contusion on the right buttock. No loss of consciousness reported. Bruising noted on the right buttock. Laceration on the right hand was bleeding significantly but has been cleaned and treated with dermabond for closure. - Apply Neosporin to the right hand laceration and keep it clean - Avoid water on the laceration for a few hours to allow dermabond to set - Administer tetanus booster due to multiple wounds    Dysphagia, unspecified type Dysphagia with significant unintentional weight loss Reports difficulty swallowing, leading to a weight loss of 23 pounds since August 26th. Chews food but unable to swallow, ongoing for a few weeks. No choking reported. Pale appearance noted. - Order blood work to evaluate causes of weight loss and dysphagia - Plan for X RAY ESOPHAGUS and possible endoscopy - Maintain appointment with Nola on October 2nd for further evaluation    Ear fullness, bilateral Cerumen impaction, bilateral Complains of ears feeling stuffed up. Examination reveals dry wax in both ears, but eardrums appear  normal. - Recommend over-the-counter Debrox ear drops, two drops in each ear every four hours to soften wax        Body mass index is 21.14 kg/m.SABRA    Gout and osteoarthritis of right hand and neck Chronic gout and osteoarthritis affecting the right hand and neck. Right middle fingertip is deformed, likely due to gout. Reports arthritis in the neck.            No orders of the defined types were placed in this encounter.   Orders Placed This Encounter  Procedures   CBC with Differential/Platelet   Comprehensive metabolic panel with GFR   Sedimentation rate   TSH   C-reactive protein  Follow-up: No follow-ups on file.  An After Visit Summary was printed and given to the patient.  Luane Rochon, MD [facility] [phone]

## 2023-12-23 NOTE — Patient Instructions (Signed)
  VISIT SUMMARY: You visited us  today due to a fall that resulted in injuries to your head and tailbone, along with other ongoing health concerns. We discussed your difficulty swallowing and significant weight loss, as well as your chronic gout and arthritis. We also addressed the sensation of stuffed ears due to wax buildup.  YOUR PLAN: DYSPHAGIA WITH SIGNIFICANT UNINTENTIONAL WEIGHT LOSS: You have been having trouble swallowing, which has led to a weight loss of 23 pounds since late August. You chew food but have difficulty swallowing it. -We will order blood work to find out the cause of your weight loss and swallowing difficulties. -We plan to schedule a swallow study and possibly an endoscopy to further investigate. -Please keep your appointment with Nola on October 2nd for further evaluation.  FALL WITH RIGHT HAND LACERATION AND RIGHT BUTTOCK CONTUSION: You fell on Sunday night, which caused a cut on your right hand and a bruise on your right buttock. There was no loss of consciousness. -Apply Neosporin to the cut on your right hand and keep it clean. -Avoid getting the cut wet for a few hours to allow the dermabond to set. -You received a tetanus booster due to multiple wounds.  GOUT AND OSTEOARTHRITIS OF RIGHT HAND AND NECK: You have chronic gout and osteoarthritis affecting your right hand and neck. Your right middle fingertip is deformed, likely due to gout, and you have arthritis in your neck.  CERUMEN IMPACTION, BILATERAL: You have a buildup of dry wax in both ears, which is making your ears feel stuffed up. -Use over-the-counter Debrox ear drops. Put two drops in each ear every four hours to soften the wax.   Follow up with Nola on 12/26/23 to discuss weight loss                   Contains text generated by [employer]bridge.                                 Contains text generated by [employer]bridge.

## 2023-12-23 NOTE — Assessment & Plan Note (Signed)
 SABRA

## 2023-12-23 NOTE — Assessment & Plan Note (Signed)
  Orders:   CBC with Differential/Platelet   Comprehensive metabolic panel with GFR   Sedimentation rate   TSH   C-reactive protein

## 2023-12-23 NOTE — Assessment & Plan Note (Signed)
 Jack Hodges

## 2023-12-24 LAB — CBC WITH DIFFERENTIAL/PLATELET
Basophils Absolute: 0.2 x10E3/uL (ref 0.0–0.2)
Basos: 1 %
EOS (ABSOLUTE): 0.3 x10E3/uL (ref 0.0–0.4)
Eos: 2 %
Hematocrit: 39.2 % (ref 37.5–51.0)
Hemoglobin: 12.1 g/dL — ABNORMAL LOW (ref 13.0–17.7)
Immature Grans (Abs): 0.2 x10E3/uL — ABNORMAL HIGH (ref 0.0–0.1)
Immature Granulocytes: 1 %
Lymphocytes Absolute: 1.4 x10E3/uL (ref 0.7–3.1)
Lymphs: 9 %
MCH: 26.9 pg (ref 26.6–33.0)
MCHC: 30.9 g/dL — ABNORMAL LOW (ref 31.5–35.7)
MCV: 87 fL (ref 79–97)
Monocytes Absolute: 0.6 x10E3/uL (ref 0.1–0.9)
Monocytes: 4 %
Neutrophils Absolute: 12.1 x10E3/uL — ABNORMAL HIGH (ref 1.4–7.0)
Neutrophils: 83 %
Platelets: 244 x10E3/uL (ref 150–450)
RBC: 4.5 x10E6/uL (ref 4.14–5.80)
RDW: 17.7 % — ABNORMAL HIGH (ref 11.6–15.4)
WBC: 14.5 x10E3/uL — ABNORMAL HIGH (ref 3.4–10.8)

## 2023-12-24 LAB — COMPREHENSIVE METABOLIC PANEL WITH GFR
ALT: 9 IU/L (ref 0–44)
AST: 15 IU/L (ref 0–40)
Albumin: 3.9 g/dL (ref 3.6–4.6)
Alkaline Phosphatase: 139 IU/L — ABNORMAL HIGH (ref 48–129)
BUN/Creatinine Ratio: 11 (ref 10–24)
BUN: 14 mg/dL (ref 10–36)
Bilirubin Total: 1.9 mg/dL — ABNORMAL HIGH (ref 0.0–1.2)
CO2: 21 mmol/L (ref 20–29)
Calcium: 9 mg/dL (ref 8.6–10.2)
Chloride: 105 mmol/L (ref 96–106)
Creatinine, Ser: 1.28 mg/dL — ABNORMAL HIGH (ref 0.76–1.27)
Globulin, Total: 1.8 g/dL (ref 1.5–4.5)
Glucose: 179 mg/dL — ABNORMAL HIGH (ref 70–99)
Potassium: 3 mmol/L — ABNORMAL LOW (ref 3.5–5.2)
Sodium: 143 mmol/L (ref 134–144)
Total Protein: 5.7 g/dL — ABNORMAL LOW (ref 6.0–8.5)
eGFR: 53 mL/min/1.73 — ABNORMAL LOW (ref >59)

## 2023-12-24 LAB — TSH: TSH: 7.36 u[IU]/mL — ABNORMAL HIGH (ref 0.450–4.500)

## 2023-12-24 LAB — C-REACTIVE PROTEIN: CRP: 12 mg/L — ABNORMAL HIGH (ref 0–10)

## 2023-12-24 LAB — SEDIMENTATION RATE: Sed Rate: 2 mm/h (ref 0–30)

## 2023-12-25 ENCOUNTER — Telehealth: Payer: Self-pay | Admitting: Physician Assistant

## 2023-12-25 DIAGNOSIS — M21371 Foot drop, right foot: Secondary | ICD-10-CM | POA: Diagnosis not present

## 2023-12-25 DIAGNOSIS — D500 Iron deficiency anemia secondary to blood loss (chronic): Secondary | ICD-10-CM | POA: Diagnosis not present

## 2023-12-25 DIAGNOSIS — M48062 Spinal stenosis, lumbar region with neurogenic claudication: Secondary | ICD-10-CM | POA: Diagnosis not present

## 2023-12-25 DIAGNOSIS — M1990 Unspecified osteoarthritis, unspecified site: Secondary | ICD-10-CM | POA: Diagnosis not present

## 2023-12-25 DIAGNOSIS — I700 Atherosclerosis of aorta: Secondary | ICD-10-CM | POA: Diagnosis not present

## 2023-12-25 DIAGNOSIS — G4730 Sleep apnea, unspecified: Secondary | ICD-10-CM | POA: Diagnosis not present

## 2023-12-25 DIAGNOSIS — G629 Polyneuropathy, unspecified: Secondary | ICD-10-CM | POA: Diagnosis not present

## 2023-12-25 DIAGNOSIS — G5603 Carpal tunnel syndrome, bilateral upper limbs: Secondary | ICD-10-CM | POA: Diagnosis not present

## 2023-12-25 DIAGNOSIS — I2510 Atherosclerotic heart disease of native coronary artery without angina pectoris: Secondary | ICD-10-CM | POA: Diagnosis not present

## 2023-12-25 DIAGNOSIS — M21372 Foot drop, left foot: Secondary | ICD-10-CM | POA: Diagnosis not present

## 2023-12-25 DIAGNOSIS — F03918 Unspecified dementia, unspecified severity, with other behavioral disturbance: Secondary | ICD-10-CM | POA: Diagnosis not present

## 2023-12-25 DIAGNOSIS — G2581 Restless legs syndrome: Secondary | ICD-10-CM | POA: Diagnosis not present

## 2023-12-25 NOTE — Telephone Encounter (Signed)
 [facility]

## 2023-12-26 ENCOUNTER — Ambulatory Visit: Admitting: Physician Assistant

## 2023-12-26 NOTE — Progress Notes (Deleted)
 Subjective:  Patient ID: Jack Hodges, male    DOB: 1930/08/24  Age: 89 y.o. MRN: [id number]  No chief complaint on file.   HPI: Discussed the use of AI scribe software for clinical note transcription with the patient, who gave verbal consent to proceed.         12/23/2023    1:43 PM 09/30/2023    3:00 PM 06/18/2023    2:29 PM 03/07/2023    1:07 PM 12/26/2022   11:30 AM  Depression screen PHQ 2/9  Decreased Interest 0 0 0 0 0  Down, Depressed, Hopeless 0 0 0 0 0  PHQ - 2 Score 0 0 0 0 0  Altered sleeping    0   Tired, decreased energy    0   Change in appetite    3   Feeling bad or failure about yourself     0   Trouble concentrating    0   Moving slowly or fidgety/restless    0   Suicidal thoughts    0   PHQ-9 Score    3   Difficult doing work/chores    Not difficult at all         12/23/2023    1:43 PM  Fall Risk   Falls in the past year? 1  Number falls in past yr: 1  Injury with Fall? 1  Risk for fall due to : History of fall(s)  Follow up Falls evaluation completed    Patient Care Team: [name], [place] as PCP - General (Physician Assistant) Bernie Lamar PARAS, MD as PCP - Cardiology (Cardiology) Ezzard Valaria LABOR, MD as Consulting Physician (Oncology) Prentiss Heddy HERO, RN as VBCI Care Management   Review of Systems  Constitutional:  Negative for appetite change, fatigue and fever.  HENT:  Negative for congestion, ear pain, sinus pressure and sore throat.   Respiratory:  Negative for cough, chest tightness, shortness of breath and wheezing.   Cardiovascular:  Negative for chest pain and palpitations.  Gastrointestinal:  Negative for abdominal pain, constipation, diarrhea, nausea and vomiting.  Genitourinary:  Negative for dysuria and hematuria.  Musculoskeletal:  Negative for arthralgias, back pain, joint swelling and myalgias.  Skin:  Negative for rash.  Neurological:  Negative for dizziness, weakness and headaches.  Psychiatric/Behavioral:  Negative  for dysphoric mood. The patient is not nervous/anxious.     Current Outpatient Medications on File Prior to Visit  Medication Sig Dispense Refill   acetaminophen  (TYLENOL ) 650 MG CR tablet Take 650 mg by mouth every 8 (eight) hours as needed for pain.      albuterol  (VENTOLIN  HFA) 108 (90 Base) MCG/ACT inhaler Inhale 2 puffs into the lungs every 2 (two) hours as needed for wheezing or shortness of breath. 6.7 g 6   ammonium lactate  (LAC-HYDRIN ) 12 % lotion Apply 1 Application topically 2 (two) times daily. Apply twice daily to arms and hands to increase moisture and reduce easy bruising.  Can also be used safely on neck, chest, and legs as needed 400 g 0   amoxicillin  (AMOXIL ) 500 MG capsule Take 4 capsules one hour before dentist visit. (Patient not taking: Reported on 12/23/2023) 30 capsule 0   carvedilol  (COREG ) 12.5 MG tablet Take 12.5 mg by mouth 2 (two) times daily.     colchicine  0.6 MG tablet Take 1 tablet (0.6 mg total) by mouth 2 (two) times daily. 30 tablet 0   ELIQUIS  5 MG TABS tablet Take 5 mg by mouth  2 (two) times daily.     ezetimibe  (ZETIA ) 10 MG tablet Take 1 tablet (10 mg total) by mouth daily. 90 tablet 1   furosemide  (LASIX ) 20 MG tablet Take 40 mg by mouth daily.     meclizine  (ANTIVERT ) 25 MG tablet Take 50 mg by mouth 2 (two) times daily as needed.     nitroGLYCERIN  (NITROSTAT ) 0.4 MG SL tablet Place 1 tablet (0.4 mg total) under the tongue every 5 (five) minutes as needed for chest pain. 25 tablet 2   pantoprazole  (PROTONIX ) 40 MG tablet Take 1 tablet (40 mg total) by mouth daily. 100 tablet 1   potassium chloride  (KLOR-CON ) 10 MEQ tablet Take 1 tablet (10 mEq total) by mouth daily. 90 tablet 3   pregabalin  (LYRICA ) 75 MG capsule Take 1 capsule (75 mg total) by mouth 2 (two) times daily. 60 capsule 2   rosuvastatin  (CRESTOR ) 20 MG tablet Take 1 tablet (20 mg total) by mouth at bedtime. 90 tablet 3   sertraline  (ZOLOFT ) 50 MG tablet TAKE ONE TABLET BY MOUTH ONCE DAILY 90  tablet 1   traMADol  (ULTRAM ) 50 MG tablet Take 1 tablet (50 mg total) by mouth 2 (two) times daily as needed. 60 tablet 0   vitamin B-12 (CYANOCOBALAMIN ) 500 MCG tablet Take 500 mcg by mouth daily.     No current facility-administered medications on file prior to visit.   Past Medical History:  Diagnosis Date   Abnormal CBC 06/13/2023   Acute bursitis of left shoulder 01/04/2020   Acute gout of right ankle 11/23/2023   Acute on chronic diastolic congestive heart failure (HCC) 11/13/2021   Last Assessment & Plan:  Formatting of this note is different from the original. Pertinent Data:   Current medication includes: furosemide  - 20 mg lisinopriL  - 10 mg metoprolol  succinate - 25 mg.  BNP (pg/mL)  Date Value  11/15/2021 433.6 (H)   eGFR (mL/min/1.71m*2)  Date Value  11/15/2021 >60.0    BP Readings from Last 3 Encounters:  11/30/21 (!) 147/82  11/15/21 (!) 109/54  11/13/21 (!) 134/74     Acute pharyngitis due to other specified organisms 03/31/2022   Acute right ankle pain 11/10/2023   Anemia of chronic disease 11/10/2023   Angina pectoris 10/07/2019   Arthritis    At high risk for malnutrition 11/23/2023   Atherosclerosis of aorta 09/24/2019   Bilateral carpal tunnel syndrome 06/13/2023   Bilateral foot-drop 01/18/2017   BMI 27.0-27.9,adult 09/24/2019   Bowel and bladder incontinence 11/23/2023   Chronic low back pain with sciatica 09/10/2023   Chronic pain syndrome 11/23/2023   Closed nondisplaced fracture of phalanx of right middle finger with routine healing 11/23/2023   Congestive heart failure (HCC) 10/16/2023   Constipation 05/01/2023   Coronary artery disease involving native coronary artery of native heart without angina pectoris 11/01/2015   Overview:  Bypass surgery in 1996 Overview:  Overview:  Bypass surgery in 1996  Last Assessment & Plan:  Continue his current regimen.  Follow up as noted.   CVA (cerebral vascular accident) (HCC) 04/03/2016   Last Assessment & Plan:  The  patient underwent extensive CVA workup.  MRI is negative.  He does have aortic stenosis by echocardiogram.  Upon review of Care everywhere he is followed by Dr. Bernie, cardiologist for this.  Carotid ultrasound negative for hemodynamically significant stenosis.  Lipid panel within normal limits.  He has not had any further episodes of dizziness or nausea vomiting.    Demand ischemia (HCC) 11/14/2021  Last Assessment & Plan:  Formatting of this note might be different from the original. Troponin flat No anginal symptoms Formatting of this note might be different from the original. Last Assessment & Plan:  Formatting of this note might be different from the original. Troponin flat No anginal symptoms   Dementia (HCC)    Depression    Diarrhea 11/23/2023   DNR (do not resuscitate) 04/03/2016   Overview:  I had an extensive discussion with the patient on 04/03/2016 regarding his end of life wishes.  He and his daughter agree that do not resuscitate is in keeping with his beliefs.   Dyslipidemia 11/01/2015   Last Assessment & Plan:  Formatting of this note might be different from the original. Cholesterol 136   Triglycerides  83  HDL  50.1  LDL Calculated  69  VLDL Cholesterol Cal  17   Continue statin.   Dyspnea    with activity   Essential hypertension 11/01/2015   Last Assessment & Plan:  Resume home medications.  Follow-up as noted above.   Gait abnormality 09/10/2023   Gastric ulcer without hemorrhage or perforation 07/04/2023   Generalized weakness 03/13/2020   GERD (gastroesophageal reflux disease)    H/O diplopia 05/08/2023   Herpes zoster 05/19/2019   History of aortic stenosis 03/11/2020   History of CVA (cerebrovascular accident) 11/14/2021   Last Assessment & Plan:  Formatting of this note might be different from the original. Neurologically intact upon assessment -continue aspirin , statin   History of stomach ulcers 10/16/2023   Hypertensive heart disease with heart failure (HCC)  11/01/2015   Last Assessment & Plan:  Resume home medications.  Follow-up as noted above.   Inflammation of right hand joint 11/10/2023   Insomnia 05/01/2023   Laceration of skin of right wrist 02/13/2022   Melena 06/12/2023   Mitral regurgitation 01/26/2022   Myocardial infarction [facility])    approx 1994   Neck pain 07/20/2023   Neuropathy    Peripheral neuropathy 11/14/2021   Last Assessment & Plan:  Formatting of this note might be different from the original. Bilateral lower extremity cool to touch with diminished sensation Patient reports findings are chronic Continue home dose Lyrica  Formatting of this note might be different from the original. Last Assessment & Plan:  Formatting of this note might be different from the original. 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   Thrombocytopenia 03/13/2020   TIA (transient ischemic attack) 03/30/2023   Trochanteric bursitis of left hip 01/19/2021   Vertigo 04/19/2016   Weight loss, unintentional 06/13/2023   Past Surgical History:  Procedure Laterality Date   CARDIAC CATHETERIZATION     CARDIAC SURGERY     CATARACT EXTRACTION, BILATERAL     COLONOSCOPY     [facility]   COLONOSCOPY N/A 07/04/2023   Procedure: COLONOSCOPY;  Surgeon: Stacia Glendia BRAVO, MD;  Location: [facility] ENDOSCOPY;  Service: Gastroenterology;   Laterality: N/A;   CORONARY ARTERY BYPASS GRAFT     CORONARY STENT INTERVENTION N/A 10/07/2019   Procedure: CORONARY STENT INTERVENTION;  Surgeon: Wonda Sharper, MD;  Location: [facility] INVASIVE CV LAB;  Service: Cardiovascular;  Laterality: N/A;   ESOPHAGOGASTRODUODENOSCOPY N/A 07/04/2023   Procedure: EGD (ESOPHAGOGASTRODUODENOSCOPY);  Surgeon: Stacia Glendia BRAVO, MD;  Location: [facility] ENDOSCOPY;  Service: Gastroenterology;  Laterality: N/A;   EYE SURGERY     HEMORROIDECTOMY     RIGHT/LEFT HEART CATH AND CORONARY/GRAFT ANGIOGRAPHY N/A 10/07/2019   Procedure: RIGHT/LEFT HEART CATH AND CORONARY/GRAFT ANGIOGRAPHY;  Surgeon: Wonda Sharper, MD;  Location: [facility] INVASIVE CV LAB;  Service: Cardiovascular;  Laterality: N/A;   TEE WITHOUT CARDIOVERSION N/A 10/27/2019   Procedure: TRANSESOPHAGEAL ECHOCARDIOGRAM (TEE);  Surgeon: Verlin Lonni BIRCH, MD;  Location: [facility] INVASIVE CV LAB;  Service: Open Heart Surgery;  Laterality: N/A;   TRANSCATHETER AORTIC VALVE REPLACEMENT, TRANSFEMORAL N/A 10/27/2019   Procedure: TRANSCATHETER AORTIC VALVE REPLACEMENT, TRANSFEMORAL;  Surgeon: Verlin Lonni BIRCH, MD;  Location: [facility] INVASIVE CV LAB;  Service: Open Heart Surgery;  Laterality: N/A;    Family History  Problem Relation Age of Onset   Cancer Mother        bone   Diabetes Mother    Kidney disease Mother    Stroke Mother    Sleep disorder Mother    Arthritis Father    Migraines Father    Hypertension Father    Sleep disorder Father    Arthritis Brother    Cancer Brother        throat   Throat cancer Daughter    Social History   Socioeconomic History   Marital status: Widowed    Spouse name: Juanita   Number of children: 2   Years of education: Not on file   Highest education level: 7th grade  Occupational History   Occupation: Retired    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  He has one son that lives with him and one daughter that lives beside of him. He is close with his family.  His sister cooks meals for him regularly.  He has friends at the cafe he visits regularly.   Social Drivers of Corporate investment banker Strain: Low Risk  (03/07/2023)   Overall Financial Resource Strain (CARDIA)    Difficulty of Paying Living Expenses: Not hard at all  Food Insecurity: No Food Insecurity (11/06/2023)   Hunger Vital Sign    Worried About Running Out of Food in the Last Year: Never true    Ran Out of Food in the Last Year: Never true  Transportation Needs: No Transportation Needs (11/06/2023)   PRAPARE - Administrator, Civil Service (Medical): No    Lack of Transportation (Non-Medical): No  Physical Activity: Insufficiently Active (03/07/2023)   Exercise Vital Sign    Days of Exercise per Week: 2 days    Minutes of Exercise per Session: 10  min  Stress: No Stress Concern Present (03/07/2023)   [employer] of Occupational Health - Occupational Stress Questionnaire    Feeling of Stress : Not at all  Social Connections: Moderately Isolated (03/07/2023)   Social Connection and Isolation Panel    Frequency of Communication with Friends and Family: More than three times a week    Frequency of Social Gatherings with Friends and Family: More than three times a week    Attends Religious Services: 1 to 4 times per year    Active Member of [employer] or Organizations: No    Attends Banker Meetings: Never    Marital Status: Widowed    Objective:  There were no vitals taken for this visit.     12/23/2023     1:37 PM 11/29/2023    3:15 PM 11/19/2023    2:29 PM  BP/Weight  Systolic BP  136 855  Diastolic BP  80 68  Wt. (Lbs) 131 137.7 154  BMI 21.14 kg/m2 22.23 kg/m2 24.86 kg/m2    Physical Exam    Lab Results  Component Value Date   WBC 14.5 (H) 12/23/2023   HGB 12.1 (L) 12/23/2023   HCT 39.2 12/23/2023   PLT 244 12/23/2023   GLUCOSE 179 (H) 12/23/2023   CHOL 106 05/08/2023   TRIG 112 05/08/2023   HDL 27 (L) 05/08/2023   LDLCALC 58 05/08/2023   ALT 9 12/23/2023   AST 15 12/23/2023   NA 143 12/23/2023   K 3.0 (L) 12/23/2023   CL 105 12/23/2023   CREATININE 1.28 (H) 12/23/2023   BUN 14 12/23/2023   CO2 21 12/23/2023   TSH 7.360 (H) 12/23/2023   INR 1.4 (H) 03/29/2023   HGBA1C 5.3 05/08/2023    Results for orders placed or performed in visit on 12/23/23  CBC with Differential/Platelet   Collection Time: 12/23/23  2:46 PM  Result Value Ref Range   WBC 14.5 (H) 3.4 - 10.8 x10E3/uL   RBC 4.50 4.14 - 5.80 x10E6/uL   Hemoglobin 12.1 (L) 13.0 - 17.7 g/dL   Hematocrit 60.7 62.4 - 51.0 %   MCV 87 79 - 97 fL   MCH 26.9 26.6 - 33.0 pg   MCHC 30.9 (L) 31.5 - 35.7 g/dL   RDW 82.2 (H) 88.3 - 84.5 %   Platelets 244 150 - 450 x10E3/uL   Neutrophils 83 Not Estab. %   Lymphs 9 Not Estab. %   Monocytes 4 Not Estab. %   Eos 2 Not Estab. %   Basos 1 Not Estab. %   Neutrophils Absolute 12.1 (H) 1.4 - 7.0 x10E3/uL   Lymphocytes Absolute 1.4 0.7 - 3.1 x10E3/uL   Monocytes Absolute 0.6 0.1 - 0.9 x10E3/uL   EOS (ABSOLUTE) 0.3 0.0 - 0.4 x10E3/uL   Basophils Absolute 0.2 0.0 - 0.2 x10E3/uL   Immature Granulocytes 1 Not Estab. %   Immature Grans (Abs) 0.2 (H) 0.0 - 0.1 x10E3/uL  Comprehensive metabolic panel with GFR   Collection Time: 12/23/23  2:46 PM  Result Value Ref Range   Glucose 179 (H) 70 - 99 mg/dL   BUN 14 10 - 36 mg/dL   Creatinine, Ser 8.71 (H) 0.76 - 1.27 mg/dL   eGFR 53 (L) >40 fO/fpw/8.26   BUN/Creatinine Ratio 11 10 - 24   Sodium 143 134 - 144 mmol/L   Potassium 3.0  (L) 3.5 - 5.2 mmol/L   Chloride 105 96 - 106 mmol/L   CO2 21 20 - 29 mmol/L  Calcium  9.0 8.6 - 10.2 mg/dL   Total Protein 5.7 (L) 6.0 - 8.5 g/dL   Albumin 3.9 3.6 - 4.6 g/dL   Globulin, Total 1.8 1.5 - 4.5 g/dL   Bilirubin Total 1.9 (H) 0.0 - 1.2 mg/dL   Alkaline Phosphatase 139 (H) 48 - 129 IU/L   AST 15 0 - 40 IU/L   ALT 9 0 - 44 IU/L  Sedimentation rate   Collection Time: 12/23/23  2:46 PM  Result Value Ref Range   Sed Rate 2 0 - 30 mm/hr  TSH   Collection Time: 12/23/23  2:46 PM  Result Value Ref Range   TSH 7.360 (H) 0.450 - 4.500 uIU/mL  C-reactive protein   Collection Time: 12/23/23  2:46 PM  Result Value Ref Range   CRP 12 (H) 0 - 10 mg/L  .  Assessment & Plan:   Assessment & Plan    There is no height or weight on file to calculate BMI.  Assessment and Plan      No orders of the defined types were placed in this encounter.   No orders of the defined types were placed in this encounter.      Follow-up: No follow-ups on file.  An After Visit Summary was printed and given to the patient.   I,Yashas Camilli M Antonela Freiman,acting as a Neurosurgeon for [employer], [employer].,have documented all relevant documentation on the behalf of Nola Angles, PA,as directed by  Nola Angles, PA while in the presence of Nola Angles, [place].    Nola Angles, [place] [facility] [phone]

## 2023-12-27 ENCOUNTER — Encounter: Payer: Self-pay | Admitting: Physician Assistant

## 2023-12-27 ENCOUNTER — Ambulatory Visit: Admitting: Physician Assistant

## 2023-12-27 ENCOUNTER — Ambulatory Visit (INDEPENDENT_AMBULATORY_CARE_PROVIDER_SITE_OTHER)
Admission: RE | Admit: 2023-12-27 | Discharge: 2023-12-27 | Disposition: A | Discharge status: Home / Self Care | Source: Ambulatory Visit | Attending: Physician Assistant | Admitting: Physician Assistant

## 2023-12-27 VITALS — BP 128/86 | HR 78 | Temp 97.8°F | Ht 66.0 in | Wt 131.0 lb

## 2023-12-27 DIAGNOSIS — R7989 Other specified abnormal findings of blood chemistry: Secondary | ICD-10-CM

## 2023-12-27 DIAGNOSIS — S298XXS Other specified injuries of thorax, sequela: Secondary | ICD-10-CM

## 2023-12-27 DIAGNOSIS — E876 Hypokalemia: Secondary | ICD-10-CM | POA: Diagnosis not present

## 2023-12-27 DIAGNOSIS — M109 Gout, unspecified: Secondary | ICD-10-CM | POA: Diagnosis not present

## 2023-12-27 DIAGNOSIS — I6523 Occlusion and stenosis of bilateral carotid arteries: Secondary | ICD-10-CM | POA: Diagnosis not present

## 2023-12-27 DIAGNOSIS — R1310 Dysphagia, unspecified: Secondary | ICD-10-CM | POA: Diagnosis not present

## 2023-12-27 DIAGNOSIS — H938X3 Other specified disorders of ear, bilateral: Secondary | ICD-10-CM | POA: Diagnosis not present

## 2023-12-27 DIAGNOSIS — R053 Chronic cough: Secondary | ICD-10-CM

## 2023-12-27 MED ORDER — TRAMADOL HCL 50 MG PO TABS: 50.0000 mg | ORAL_TABLET | Freq: Two times a day (BID) | ORAL | 0 refills | Status: DC | PRN

## 2023-12-27 MED ORDER — BENZONATATE 200 MG PO CAPS: 200.0000 mg | ORAL_CAPSULE | Freq: Three times a day (TID) | ORAL | 0 refills | Status: DC | PRN

## 2023-12-27 MED ORDER — POTASSIUM CHLORIDE CRYS ER 20 MEQ PO TBCR: 40.0000 meq | EXTENDED_RELEASE_TABLET | Freq: Two times a day (BID) | ORAL | 3 refills | Status: DC

## 2023-12-27 NOTE — Assessment & Plan Note (Signed)
 Hearing loss and cerumen impaction Hearing loss with ear fullness, likely due to cerumen impaction. Previous ENT evaluation noted. - Recommend Debrox ear drops for cerumen removal.  Chronic dizziness Chronic dizziness.  - Continue meclizine  for management.

## 2023-12-27 NOTE — Assessment & Plan Note (Signed)
 Gout with persistent finger involvement Chronic gout with persistent finger involvement, possibly due to elevated uric acid. - Order uric acid level. - Continue current gout management and monitor. Orders:   Uric acid

## 2023-12-27 NOTE — Progress Notes (Signed)
 Subjective:  Patient ID: Jack Hodges, male    DOB: [birth year]  Age: 89 y.o. MRN: [id number]  Chief Complaint  Patient presents with   4 week follow up    HPI: Discussed the use of AI scribe software for clinical note transcription with the patient, who gave verbal consent to proceed.  History of Present Illness Jack Hodges is a 89 year old male who presents for a chronic follow-up visit.  He experienced a fall on Monday, resulting in some drainage from a wound. He has pain in his fingers, particularly after the fall, but no difficulty making a fist. No itching is present.  He has difficulty swallowing solid foods, describing it as a sensation of food getting stuck, but denies issues with liquids. He has been consuming Ensure, reducing from two or three to one per day due to a loss of appetite and weight loss. He is concerned about his weight loss and lack of appetite.   He is currently taking potassium medication and Lasix , with a regimen of two in the morning and one at night. He also takes Tramadol  for pain management.  He experiences dizziness throughout the day and continues to take Meclizine  for this symptom. He recalls undergoing both an endoscopy and colonoscopy earlier this year due to blood loss, with the endoscopy showing a normal throat.  He reports a persistent cough that has worsened over the past two weeks. He recently experienced constipation, with no bowel movement for four days, but denies severe constipation or stomach pain.  He has a history of gout, with concerns about his uric acid levels. His gout symptoms have been affecting his fingers, causing them to remain large.  He mentions a sensation of fullness in his ears, feeling like they are underwater, but denies sinus congestion. He has not been taking any allergy medication.          12/23/2023    1:43 PM 09/30/2023    3:00 PM 06/18/2023    2:29 PM 03/07/2023    1:07 PM 12/26/2022   11:30 AM  Depression  screen PHQ 2/9  Decreased Interest 0 0 0 0 0  Down, Depressed, Hopeless 0 0 0 0 0  PHQ - 2 Score 0 0 0 0 0  Altered sleeping    0   Tired, decreased energy    0   Change in appetite    3   Feeling bad or failure about yourself     0   Trouble concentrating    0   Moving slowly or fidgety/restless    0   Suicidal thoughts    0   PHQ-9 Score    3   Difficult doing work/chores    Not difficult at all         12/23/2023    1:43 PM  Fall Risk   Falls in the past year? 1  Number falls in past yr: 1  Injury with Fall? 1  Risk for fall due to : History of fall(s)  Follow up Falls evaluation completed    Patient Care Team: [name], [place] as PCP - General (Physician Assistant) Bernie Lamar PARAS, MD as PCP - Cardiology (Cardiology) Ezzard Valaria LABOR, MD as Consulting Physician (Oncology) Prentiss Heddy HERO, RN as VBCI Care Management   Review of Systems  Constitutional:  Negative for appetite change, fatigue and fever.  HENT:  Positive for ear pain. Negative for congestion, sinus pressure and sore throat.   Respiratory:  Negative for cough, chest tightness, shortness of breath and wheezing.   Cardiovascular:  Negative for chest pain and palpitations.  Gastrointestinal:  Negative for abdominal pain, constipation, diarrhea, nausea and vomiting.  Genitourinary:  Negative for dysuria and hematuria.  Musculoskeletal:  Negative for arthralgias, back pain, joint swelling and myalgias.  Skin:  Negative for rash.  Neurological:  Negative for dizziness, weakness and headaches.  Psychiatric/Behavioral:  Negative for dysphoric mood. The patient is not nervous/anxious.     Current Outpatient Medications on File Prior to Visit  Medication Sig Dispense Refill   acetaminophen  (TYLENOL ) 650 MG CR tablet Take 650 mg by mouth every 8 (eight) hours as needed for pain.      albuterol  (VENTOLIN  HFA) 108 (90 Base) MCG/ACT inhaler Inhale 2 puffs into the lungs every 2 (two) hours as needed for wheezing  or shortness of breath. 6.7 g 6   ammonium lactate  (LAC-HYDRIN ) 12 % lotion Apply 1 Application topically 2 (two) times daily. Apply twice daily to arms and hands to increase moisture and reduce easy bruising.  Can also be used safely on neck, chest, and legs as needed 400 g 0   carvedilol  (COREG ) 12.5 MG tablet Take 12.5 mg by mouth 2 (two) times daily.     colchicine  0.6 MG tablet Take 1 tablet (0.6 mg total) by mouth 2 (two) times daily. 30 tablet 0   ELIQUIS  5 MG TABS tablet Take 5 mg by mouth 2 (two) times daily.     ezetimibe  (ZETIA ) 10 MG tablet Take 1 tablet (10 mg total) by mouth daily. 90 tablet 1   furosemide  (LASIX ) 20 MG tablet Take 40 mg by mouth daily.     meclizine  (ANTIVERT ) 25 MG tablet Take 50 mg by mouth 2 (two) times daily as needed.     nitroGLYCERIN  (NITROSTAT ) 0.4 MG SL tablet Place 1 tablet (0.4 mg total) under the tongue every 5 (five) minutes as needed for chest pain. 25 tablet 2   pantoprazole  (PROTONIX ) 40 MG tablet Take 1 tablet (40 mg total) by mouth daily. 100 tablet 1   potassium chloride  (KLOR-CON ) 10 MEQ tablet Take 1 tablet (10 mEq total) by mouth daily. 90 tablet 3   pregabalin  (LYRICA ) 75 MG capsule Take 1 capsule (75 mg total) by mouth 2 (two) times daily. 60 capsule 2   rosuvastatin  (CRESTOR ) 20 MG tablet Take 1 tablet (20 mg total) by mouth at bedtime. 90 tablet 3   sertraline  (ZOLOFT ) 50 MG tablet TAKE ONE TABLET BY MOUTH ONCE DAILY 90 tablet 1   vitamin B-12 (CYANOCOBALAMIN ) 500 MCG tablet Take 500 mcg by mouth daily.     No current facility-administered medications on file prior to visit.   Past Medical History:  Diagnosis Date   Abnormal CBC 06/13/2023   Acute bursitis of left shoulder 01/04/2020   Acute gout of right ankle 11/23/2023   Acute on chronic diastolic congestive heart failure (HCC) 11/13/2021   Last Assessment & Plan:  Formatting of this note is different from the original. Pertinent Data:   Current medication includes: furosemide  - 20  mg lisinopriL  - 10 mg metoprolol  succinate - 25 mg.  BNP (pg/mL)  Date Value  11/15/2021 433.6 (H)   eGFR (mL/min/1.25m*2)  Date Value  11/15/2021 >60.0    BP Readings from Last 3 Encounters:  11/30/21 (!) 147/82  11/15/21 (!) 109/54  11/13/21 (!) 134/74     Acute pharyngitis due to other specified organisms 03/31/2022   Acute right ankle pain 11/10/2023  Anemia of chronic disease 11/10/2023   Angina pectoris 10/07/2019   Arthritis    At high risk for malnutrition 11/23/2023   Atherosclerosis of aorta 09/24/2019   Bilateral carpal tunnel syndrome 06/13/2023   Bilateral foot-drop 01/18/2017   BMI 27.0-27.9,adult 09/24/2019   Bowel and bladder incontinence 11/23/2023   Chronic low back pain with sciatica 09/10/2023   Chronic pain syndrome 11/23/2023   Closed nondisplaced fracture of phalanx of right middle finger with routine healing 11/23/2023   Congestive heart failure (HCC) 10/16/2023   Constipation 05/01/2023   Coronary artery disease involving native coronary artery of native heart without angina pectoris 11/01/2015   Overview:  Bypass surgery in 1996 Overview:  Overview:  Bypass surgery in 1996  Last Assessment & Plan:  Continue his current regimen.  Follow up as noted.   CVA (cerebral vascular accident) (HCC) 04/03/2016   Last Assessment & Plan:  The patient underwent extensive CVA workup.  MRI is negative.  He does have aortic stenosis by echocardiogram.  Upon review of Care everywhere he is followed by Dr. Bernie, cardiologist for this.  Carotid ultrasound negative for hemodynamically significant stenosis.  Lipid panel within normal limits.  He has not had any further episodes of dizziness or nausea vomiting.    Demand ischemia (HCC) 11/14/2021   Last Assessment & Plan:  Formatting of this note might be different from the original. Troponin flat No anginal symptoms Formatting of this note might be different from the original. Last Assessment & Plan:  Formatting of this note might be  different from the original. Troponin flat No anginal symptoms   Dementia (HCC)    Depression    Diarrhea 11/23/2023   DNR (do not resuscitate) 04/03/2016   Overview:  I had an extensive discussion with the patient on 04/03/2016 regarding his end of life wishes.  He and his daughter agree that do not resuscitate is in keeping with his beliefs.   Dyslipidemia 11/01/2015   Last Assessment & Plan:  Formatting of this note might be different from the original. Cholesterol 136   Triglycerides  83  HDL  50.1  LDL Calculated  69  VLDL Cholesterol Cal  17   Continue statin.   Dyspnea    with activity   Essential hypertension 11/01/2015   Last Assessment & Plan:  Resume home medications.  Follow-up as noted above.   Gait abnormality 09/10/2023   Gastric ulcer without hemorrhage or perforation 07/04/2023   Generalized weakness 03/13/2020   GERD (gastroesophageal reflux disease)    H/O diplopia 05/08/2023   Herpes zoster 05/19/2019   History of aortic stenosis 03/11/2020   History of CVA (cerebrovascular accident) 11/14/2021   Last Assessment & Plan:  Formatting of this note might be different from the original. Neurologically intact upon assessment -continue aspirin , statin   History of stomach ulcers 10/16/2023   Hypertensive heart disease with heart failure (HCC) 11/01/2015   Last Assessment & Plan:  Resume home medications.  Follow-up as noted above.   Inflammation of right hand joint 11/10/2023   Insomnia 05/01/2023   Laceration of skin of right wrist 02/13/2022   Melena 06/12/2023   Mitral regurgitation 01/26/2022   Myocardial infarction [facility])    approx 1994   Neck pain 07/20/2023   Neuropathy    Peripheral neuropathy 11/14/2021   Last Assessment & Plan:  Formatting of this note might be different from the original. Bilateral lower extremity cool to touch with diminished sensation Patient reports findings are chronic Continue home  dose Lyrica  Formatting of this note might be different  from the original. Last Assessment & Plan:  Formatting of this note might be different from the original. 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   Thrombocytopenia 03/13/2020   TIA (transient ischemic attack) 03/30/2023   Trochanteric bursitis of left hip 01/19/2021   Vertigo 04/19/2016   Weight loss, unintentional 06/13/2023   Past Surgical History:  Procedure Laterality Date   CARDIAC CATHETERIZATION     CARDIAC SURGERY     CATARACT EXTRACTION, BILATERAL     COLONOSCOPY     [facility]   COLONOSCOPY N/A 07/04/2023   Procedure: COLONOSCOPY;  Surgeon: Stacia Glendia BRAVO, MD;  Location: [facility] ENDOSCOPY;  Service: Gastroenterology;  Laterality: N/A;   CORONARY ARTERY BYPASS GRAFT     CORONARY STENT INTERVENTION N/A 10/07/2019   Procedure: CORONARY STENT INTERVENTION;  Surgeon: Wonda Sharper, MD;  Location: [facility] INVASIVE CV LAB;  Service: Cardiovascular;  Laterality: N/A;   ESOPHAGOGASTRODUODENOSCOPY N/A 07/04/2023   Procedure: EGD (ESOPHAGOGASTRODUODENOSCOPY);  Surgeon: Stacia Glendia BRAVO, MD;  Location: [facility] ENDOSCOPY;  Service: Gastroenterology;  Laterality: N/A;   EYE SURGERY     HEMORROIDECTOMY     RIGHT/LEFT HEART CATH AND CORONARY/GRAFT ANGIOGRAPHY N/A 10/07/2019   Procedure: RIGHT/LEFT HEART CATH AND CORONARY/GRAFT ANGIOGRAPHY;  Surgeon: Wonda Sharper, MD;   Location: [facility] INVASIVE CV LAB;  Service: Cardiovascular;  Laterality: N/A;   TEE WITHOUT CARDIOVERSION N/A 10/27/2019   Procedure: TRANSESOPHAGEAL ECHOCARDIOGRAM (TEE);  Surgeon: Verlin Lonni BIRCH, MD;  Location: [facility] INVASIVE CV LAB;  Service: Open Heart Surgery;  Laterality: N/A;   TRANSCATHETER AORTIC VALVE REPLACEMENT, TRANSFEMORAL N/A 10/27/2019   Procedure: TRANSCATHETER AORTIC VALVE REPLACEMENT, TRANSFEMORAL;  Surgeon: Verlin Lonni BIRCH, MD;  Location: [facility] INVASIVE CV LAB;  Service: Open Heart Surgery;  Laterality: N/A;    Family History  Problem Relation Age of Onset   Cancer Mother        bone   Diabetes Mother    Kidney disease Mother    Stroke Mother    Sleep disorder Mother    Arthritis Father    Migraines Father    Hypertension Father    Sleep disorder Father    Arthritis Brother    Cancer Brother        throat   Throat cancer Daughter    Social History   Socioeconomic History   Marital status: Widowed    Spouse name: Juanita   Number of children: 2   Years of education: Not on file   Highest education level: 7th grade  Occupational History   Occupation: Retired    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  He has one son that lives with him and one daughter that lives beside of him. He is close with his family.  His sister cooks meals for him regularly.  He has friends at the cafe he visits regularly.   Social Drivers of Corporate investment banker Strain: Low Risk  (03/07/2023)   Overall Financial  Resource Strain (CARDIA)    Difficulty of Paying Living Expenses: Not hard at all  Food Insecurity: No Food Insecurity (11/06/2023)   Hunger Vital Sign    Worried About Running Out of Food in the Last Year: Never true    Ran Out of Food in the Last Year: Never true  Transportation Needs: No Transportation Needs (11/06/2023)   PRAPARE - Administrator, Civil Service (Medical): No    Lack of Transportation (Non-Medical): No  Physical Activity: Insufficiently Active (03/07/2023)   Exercise Vital Sign    Days of Exercise per Week: 2 days    Minutes of Exercise per Session: 10 min  Stress: No Stress Concern Present (03/07/2023)   [employer] of Occupational Health - Occupational Stress Questionnaire    Feeling of Stress : Not at all  Social Connections: Moderately Isolated (03/07/2023)   Social Connection and Isolation Panel    Frequency of Communication with Friends and Family: More than three times a week    Frequency of Social Gatherings with Friends and Family: More than three times a week    Attends Religious Services: 1 to 4 times per year    Active Member of [employer] or Organizations: No    Attends Banker Meetings: Never    Marital Status: Widowed    Objective:  BP 128/86   Pulse 78   Temp 97.8 F (36.6 C) (Temporal)   Ht 5' 6 (1.676 m)   Wt 131 lb (59.4 kg)   SpO2 98%   BMI 21.14 kg/m      12/27/2023   12:03 PM 12/27/2023   11:31 AM 12/23/2023    1:37 PM  BP/Weight  Systolic BP 128    Diastolic BP 86    Wt. (Lbs)  131 131  BMI  21.14 kg/m2 21.14 kg/m2    Physical Exam Constitutional:      Appearance: He is cachectic.  Cardiovascular:     Rate and Rhythm: Normal rate.  Pulmonary:     Effort: Pulmonary effort is normal. No prolonged expiration or respiratory distress.  Abdominal:     Palpations: Abdomen is soft.     Tenderness: There is no abdominal tenderness. There is no guarding or rebound.  Musculoskeletal:     Right lower leg:  No edema.     Left lower leg: No edema.  Neurological:     Mental Status: He is alert.       Lab Results  Component Value Date   WBC 14.5 (H) 12/23/2023   HGB 12.1 (L) 12/23/2023   HCT 39.2 12/23/2023   PLT 244 12/23/2023   GLUCOSE 179 (H) 12/23/2023   CHOL 106 05/08/2023   TRIG 112 05/08/2023   HDL 27 (L) 05/08/2023   LDLCALC 58 05/08/2023   ALT 9 12/23/2023   AST 15 12/23/2023  NA 143 12/23/2023   K 3.0 (L) 12/23/2023   CL 105 12/23/2023   CREATININE 1.28 (H) 12/23/2023   BUN 14 12/23/2023   CO2 21 12/23/2023   TSH 7.360 (H) 12/23/2023   INR 1.4 (H) 03/29/2023   HGBA1C 5.3 05/08/2023    Results for orders placed or performed in visit on 12/23/23  CBC with Differential/Platelet   Collection Time: 12/23/23  2:46 PM  Result Value Ref Range   WBC 14.5 (H) 3.4 - 10.8 x10E3/uL   RBC 4.50 4.14 - 5.80 x10E6/uL   Hemoglobin 12.1 (L) 13.0 - 17.7 g/dL   Hematocrit 60.7 62.4 - 51.0 %   MCV 87 79 - 97 fL   MCH 26.9 26.6 - 33.0 pg   MCHC 30.9 (L) 31.5 - 35.7 g/dL   RDW 82.2 (H) 88.3 - 84.5 %   Platelets 244 150 - 450 x10E3/uL   Neutrophils 83 Not Estab. %   Lymphs 9 Not Estab. %   Monocytes 4 Not Estab. %   Eos 2 Not Estab. %   Basos 1 Not Estab. %   Neutrophils Absolute 12.1 (H) 1.4 - 7.0 x10E3/uL   Lymphocytes Absolute 1.4 0.7 - 3.1 x10E3/uL   Monocytes Absolute 0.6 0.1 - 0.9 x10E3/uL   EOS (ABSOLUTE) 0.3 0.0 - 0.4 x10E3/uL   Basophils Absolute 0.2 0.0 - 0.2 x10E3/uL   Immature Granulocytes 1 Not Estab. %   Immature Grans (Abs) 0.2 (H) 0.0 - 0.1 x10E3/uL  Comprehensive metabolic panel with GFR   Collection Time: 12/23/23  2:46 PM  Result Value Ref Range   Glucose 179 (H) 70 - 99 mg/dL   BUN 14 10 - 36 mg/dL   Creatinine, Ser 8.71 (H) 0.76 - 1.27 mg/dL   eGFR 53 (L) >40 fO/fpw/8.26   BUN/Creatinine Ratio 11 10 - 24   Sodium 143 134 - 144 mmol/L   Potassium 3.0 (L) 3.5 - 5.2 mmol/L   Chloride 105 96 - 106 mmol/L   CO2 21 20 - 29 mmol/L   Calcium  9.0 8.6 -  10.2 mg/dL   Total Protein 5.7 (L) 6.0 - 8.5 g/dL   Albumin 3.9 3.6 - 4.6 g/dL   Globulin, Total 1.8 1.5 - 4.5 g/dL   Bilirubin Total 1.9 (H) 0.0 - 1.2 mg/dL   Alkaline Phosphatase 139 (H) 48 - 129 IU/L   AST 15 0 - 40 IU/L   ALT 9 0 - 44 IU/L  Sedimentation rate   Collection Time: 12/23/23  2:46 PM  Result Value Ref Range   Sed Rate 2 0 - 30 mm/hr  TSH   Collection Time: 12/23/23  2:46 PM  Result Value Ref Range   TSH 7.360 (H) 0.450 - 4.500 uIU/mL  C-reactive protein   Collection Time: 12/23/23  2:46 PM  Result Value Ref Range   CRP 12 (H) 0 - 10 mg/L  .  Assessment & Plan:   Assessment & Plan Rib contusion, right, sequela Denies any new or worsening rib pain Continue to monitor Will adjust treatment based on symptoms Orders:   traMADol  (ULTRAM ) 50 MG tablet; Take 1 tablet (50 mg total) by mouth 2 (two) times daily as needed.  Acute gout of right ankle, unspecified cause Gout with persistent finger involvement Chronic gout with persistent finger involvement, possibly due to elevated uric acid. - Order uric acid level. - Continue current gout management and monitor. Orders:   Uric acid  Abnormal thyroid  blood test Hypothyroidism (pending further evaluation) Suspected hypothyroidism with  inconclusive TSH test. Symptoms include fatigue and poor appetite. - Order TSH, free T4, T3, and thyroid  antibodies. - Consider low-dose thyroid  medication pending results. Orders:   T4, free   TSH   T3, Free   Thyroid  peroxidase antibody  Chronic cough Chronic cough Chronic cough worsening over two weeks. Possible ENT involvement needed. - Prescribe Tessalon  Perles for cough. - Refer to ENT for further evaluation. Orders:   benzonatate  (TESSALON ) 200 MG capsule; Take 1 capsule (200 mg total) by mouth 3 (three) times daily as needed for cough.  Dysphagia, unspecified type Dysphagia (solids only) with unintentional weight loss and poor appetite Chronic dysphagia with  difficulty swallowing solids, leading to weight loss and poor appetite. Previous endoscopy normal. Concern for further weight loss. - Urgent ENT referral for swallowing evaluation. - Order neck X-ray for structural assessment. - Encouraged 3-4 Ensure shakes daily for nutrition. Orders:   DG Neck Soft Tissue; Future   Ambulatory referral to ENT  Ear fullness, bilateral Hearing loss and cerumen impaction Hearing loss with ear fullness, likely due to cerumen impaction. Previous ENT evaluation noted. - Recommend Debrox ear drops for cerumen removal.  Chronic dizziness Chronic dizziness.  - Continue meclizine  for management.      Body mass index is 21.14 kg/m.     Meds ordered this encounter  Medications   traMADol  (ULTRAM ) 50 MG tablet    Sig: Take 1 tablet (50 mg total) by mouth 2 (two) times daily as needed.    Dispense:  60 tablet    Refill:  0   benzonatate  (TESSALON ) 200 MG capsule    Sig: Take 1 capsule (200 mg total) by mouth 3 (three) times daily as needed for cough.    Dispense:  30 capsule    Refill:  0    Orders Placed This Encounter  Procedures   DG Neck Soft Tissue   T4, free   TSH   Uric acid   T3, Free   Thyroid  peroxidase antibody   Ambulatory referral to ENT       Follow-up: No follow-ups on file.  An After Visit Summary was printed and given to the patient.   I,Lauren M Auman,acting as a Neurosurgeon for [employer], PA.,have documented all relevant documentation on the behalf of Nola Angles, PA,as directed by  Nola Angles, PA while in the presence of Nola Angles, [place].    Nola Angles, [place] [facility] [phone]

## 2023-12-27 NOTE — Assessment & Plan Note (Signed)
 Dysphagia (solids only) with unintentional weight loss and poor appetite Chronic dysphagia with difficulty swallowing solids, leading to weight loss and poor appetite. Previous endoscopy normal. Concern for further weight loss. - Urgent ENT referral for swallowing evaluation. - Order neck X-ray for structural assessment. - Encouraged 3-4 Ensure shakes daily for nutrition. Orders:   DG Neck Soft Tissue; Future   Ambulatory referral to ENT

## 2023-12-27 NOTE — Assessment & Plan Note (Signed)
 Denies any new or worsening rib pain Continue to monitor Will adjust treatment based on symptoms Orders:   traMADol  (ULTRAM ) 50 MG tablet; Take 1 tablet (50 mg total) by mouth 2 (two) times daily as needed.

## 2023-12-27 NOTE — Telephone Encounter (Unsigned)
 Copied from CRM [id number]. Topic: Clinical - Home Health Verbal Orders >> Dec 27, 2023 10:24 AM Tiffini S wrote: Caller/Agency: Rochelle with [facility]  Callback Number: [phone] Service Requested: Social work evaluation  Frequency: 1 week 1 for evaluation  Any new concerns about the patient? Yes, concerns about level of care that the son is providing

## 2023-12-28 ENCOUNTER — Ambulatory Visit: Payer: Self-pay | Admitting: Physician Assistant

## 2023-12-28 DIAGNOSIS — M1A0491 Idiopathic chronic gout, unspecified hand, with tophus (tophi): Secondary | ICD-10-CM

## 2023-12-28 DIAGNOSIS — E039 Hypothyroidism, unspecified: Secondary | ICD-10-CM

## 2023-12-28 LAB — THYROID PEROXIDASE ANTIBODY: Thyroperoxidase Ab SerPl-aCnc: 11 [IU]/mL (ref 0–34)

## 2023-12-28 LAB — T4, FREE: Free T4: 1.24 ng/dL (ref 0.82–1.77)

## 2023-12-28 LAB — URIC ACID: Uric Acid: 8.8 mg/dL — ABNORMAL HIGH (ref 3.8–8.4)

## 2023-12-28 LAB — TSH: TSH: 11.5 u[IU]/mL — ABNORMAL HIGH (ref 0.450–4.500)

## 2023-12-28 LAB — T3, FREE: T3, Free: 2.2 pg/mL (ref 2.0–4.4)

## 2023-12-30 ENCOUNTER — Ambulatory Visit: Payer: Self-pay

## 2023-12-30 ENCOUNTER — Ambulatory Visit: Payer: Self-pay | Admitting: Physician Assistant

## 2023-12-30 DIAGNOSIS — M109 Gout, unspecified: Secondary | ICD-10-CM

## 2023-12-30 NOTE — Telephone Encounter (Signed)
 FYI Only or Action Required?: Action required by provider: medication refill request and update on patient condition.  Patient was last seen in primary care on 12/27/2023 by Jack Cleaves, PA.  Called Nurse Triage reporting Medication Refill and Edema.  Symptoms began several days ago.  Interventions attempted: Prescription medications: colchicine .  Symptoms are: swelling in right middle finger and ankles slightly improved.  Triage Disposition: Call PCP Within 24 Hours  Patient/caregiver understands and will follow disposition?: Yes           Copied from CRM [id number]. Topic: Clinical - Red Word Triage >> Dec 30, 2023  2:58 PM Jack Hodges wrote: Red Word that prompted transfer to Nurse Triage: fingers and ankles are swollen still. Was prescribed colchicine  0.6 MG tablet [[id number]] Reason for Disposition  [1] Follow-up call from patient regarding patient's clinical status AND [2] information NON-URGENT  Answer Assessment - Initial Assessment Questions 1. REASON FOR CALL or QUESTION: What is your reason for calling today? or How can I best     Patient was seen on 12/27/23 with PCP Hodges Jack. Patient has been taking colchicine  twice daily. Swelling in finger and ankles are a little better compared to on Friday but still has not gone down/improved as much as his son thinks it should have. Son calling in due to patient only has 1 day left of colchicine . He states he thinks it is helping, just concerned as he states his right middle finger is still very swollen. Son requesting refill on colchicine .  2. CALLER: Document the source of call. (e.g., laboratory staff, caregiver or patient).     Son, Jack Hodges.  Protocols used: PCP Call - No Triage-A-AH

## 2024-01-01 ENCOUNTER — Other Ambulatory Visit: Payer: Self-pay | Admitting: Family Medicine

## 2024-01-01 MED ORDER — LEVOTHYROXINE SODIUM 25 MCG PO TABS: 25.0000 ug | ORAL_TABLET | Freq: Every day | ORAL | 0 refills | Status: DC

## 2024-01-01 MED ORDER — ALLOPURINOL 100 MG PO TABS: 100.0000 mg | ORAL_TABLET | Freq: Every day | ORAL | 6 refills | Status: DC

## 2024-01-01 MED ORDER — COLCHICINE 0.6 MG PO TABS: 0.6000 mg | ORAL_TABLET | Freq: Every day | ORAL | 0 refills | Status: DC

## 2024-01-13 ENCOUNTER — Telehealth: Payer: Self-pay

## 2024-01-13 NOTE — Patient Outreach (Unsigned)
 Complex Care Management   Visit Note  01/13/2024  Name:  Jack Hodges MRN: [id number] DOB: 03-11-1931  Situation: Referral received for Complex Care Management related to Heart Failure, Dementia, and HTN I obtained verbal consent from Patient.  Visit completed with Patient  on the phone  Assessment: RNCM called to complete telephone follow up. Patient son reports patient is at the ENT provider appointment at this time.  Recommendation:   Continue to follow up with Providers as recommended  Follow Up Plan:   Telephone follow up appointment date/time:  01/23/24 at 2:00 pm   Heddy Shutter, RN, MSN, BSN, CCM [place]  [facility], Population Health Case Manager Phone: [phone]

## 2024-01-14 ENCOUNTER — Other Ambulatory Visit: Payer: Self-pay | Admitting: Physician Assistant

## 2024-01-14 ENCOUNTER — Telehealth: Payer: Self-pay

## 2024-01-14 MED ORDER — ELIQUIS 5 MG PO TABS: 5.0000 mg | ORAL_TABLET | Freq: Two times a day (BID) | ORAL | 0 refills | Status: DC

## 2024-01-14 NOTE — Telephone Encounter (Signed)
 Copied from CRM #[id number]. Topic: Referral - Request for Referral >> Jan 14, 2024  1:35 PM Zebedee SAUNDERS wrote: Did the patient discuss referral with their provider in the last year? Yes (If No - schedule appointment) (If Yes - send message)  Appointment offered? Yes  Type of order/referral and detailed reason for visit: Podiatry for ingrown toe nails  Preference of office, provider, location: In pt's area  If referral order, have you been seen by this specialty before? No (If Yes, this issue or another issue? When? Where?  Can we respond through MyChart? No

## 2024-01-14 NOTE — Telephone Encounter (Signed)
 Copied from CRM #[id number]. Topic: Clinical - Medication Refill >> Jan 14, 2024  1:39 PM Zebedee SAUNDERS wrote: Medication: ELIQUIS  5 MG TABS tablet  Has the patient contacted their pharmacy? Yes (Agent: If no, request that the patient contact the pharmacy for the refill. If patient does not wish to contact the pharmacy document the reason why and proceed with request.) (Agent: If yes, when and what did the pharmacy advise?)  This is the patient's preferred pharmacy:  [facility] - [place], [place] - [address] [place] [address] Phone: [phone] Fax: [phone]  Is this the correct pharmacy for this prescription? Yes If no, delete pharmacy and type the correct one.   Has the prescription been filled recently? Yes  Is the patient out of the medication? Yes  Has the patient been seen for an appointment in the last year OR does the patient have an upcoming appointment? Yes  Can we respond through MyChart? Yes  Agent: Please be advised that Rx refills may take up to 3 business days. We ask that you follow-up with your pharmacy.

## 2024-01-15 ENCOUNTER — Other Ambulatory Visit: Payer: Self-pay | Admitting: Physician Assistant

## 2024-01-15 ENCOUNTER — Encounter: Payer: Self-pay | Admitting: Oncology

## 2024-01-15 DIAGNOSIS — L600 Ingrowing nail: Secondary | ICD-10-CM

## 2024-01-15 NOTE — Telephone Encounter (Signed)
 Spoke with patient's son, verbalized understanding and had no questions at this time.

## 2024-01-21 ENCOUNTER — Other Ambulatory Visit: Payer: Self-pay

## 2024-01-23 ENCOUNTER — Other Ambulatory Visit: Payer: Self-pay

## 2024-01-23 NOTE — Patient Outreach (Signed)
 Complex Care Management   Visit Note  01/23/2024  Name:  Jack Hodges MRN: [id number] DOB: 1930-06-08  Situation: Referral received for Complex Care Management related to Heart Failure, Demential and HTN I obtained verbal consent from Patient.  Visit completed with Patient  on the phone  Background:   Past Medical History:  Diagnosis Date   Abnormal CBC 06/13/2023   Acute bursitis of left shoulder 01/04/2020   Acute gout of right ankle 11/23/2023   Acute on chronic diastolic congestive heart failure (HCC) 11/13/2021   Last Assessment & Plan:  Formatting of this note is different from the original. Pertinent Data:   Current medication includes: furosemide  - 20 mg lisinopriL  - 10 mg metoprolol  succinate - 25 mg.  BNP (pg/mL)  Date Value  11/15/2021 433.6 (H)   eGFR (mL/min/1.59m*2)  Date Value  11/15/2021 >60.0    BP Readings from Last 3 Encounters:  11/30/21 (!) 147/82  11/15/21 (!) 109/54  11/13/21 (!) 134/74     Acute pharyngitis due to other specified organisms 03/31/2022   Acute right ankle pain 11/10/2023   Anemia of chronic disease 11/10/2023   Angina pectoris 10/07/2019   Arthritis    At high risk for malnutrition 11/23/2023   Atherosclerosis of aorta 09/24/2019   Bilateral carpal tunnel syndrome 06/13/2023   Bilateral foot-drop 01/18/2017   BMI 27.0-27.9,adult 09/24/2019   Bowel and bladder incontinence 11/23/2023   Chronic low back pain with sciatica 09/10/2023   Chronic pain syndrome 11/23/2023   Closed nondisplaced fracture of phalanx of right middle finger with routine healing 11/23/2023   Congestive heart failure (HCC) 10/16/2023   Constipation 05/01/2023   Coronary artery disease involving native coronary artery of native heart without angina pectoris 11/01/2015   Overview:  Bypass surgery in 1996 Overview:  Overview:  Bypass surgery in 1996  Last Assessment & Plan:  Continue his current regimen.  Follow up as noted.   CVA (cerebral vascular accident) (HCC)  04/03/2016   Last Assessment & Plan:  The patient underwent extensive CVA workup.  MRI is negative.  He does have aortic stenosis by echocardiogram.  Upon review of Care everywhere he is followed by Dr. Bernie, cardiologist for this.  Carotid ultrasound negative for hemodynamically significant stenosis.  Lipid panel within normal limits.  He has not had any further episodes of dizziness or nausea vomiting.    Demand ischemia (HCC) 11/14/2021   Last Assessment & Plan:  Formatting of this note might be different from the original. Troponin flat No anginal symptoms Formatting of this note might be different from the original. Last Assessment & Plan:  Formatting of this note might be different from the original. Troponin flat No anginal symptoms   Dementia (HCC)    Depression    Diarrhea 11/23/2023   DNR (do not resuscitate) 04/03/2016   Overview:  I had an extensive discussion with the patient on 04/03/2016 regarding his end of life wishes.  He and his daughter agree that do not resuscitate is in keeping with his beliefs.   Dyslipidemia 11/01/2015   Last Assessment & Plan:  Formatting of this note might be different from the original. Cholesterol 136   Triglycerides  83  HDL  50.1  LDL Calculated  69  VLDL Cholesterol Cal  17   Continue statin.   Dyspnea    with activity   Essential hypertension 11/01/2015   Last Assessment & Plan:  Resume home medications.  Follow-up as noted above.   Gait abnormality 09/10/2023  Gastric ulcer without hemorrhage or perforation 07/04/2023   Generalized weakness 03/13/2020   GERD (gastroesophageal reflux disease)    H/O diplopia 05/08/2023   Herpes zoster 05/19/2019   History of aortic stenosis 03/11/2020   History of CVA (cerebrovascular accident) 11/14/2021   Last Assessment & Plan:  Formatting of this note might be different from the original. Neurologically intact upon assessment -continue aspirin , statin   History of stomach ulcers 10/16/2023    Hypertensive heart disease with heart failure (HCC) 11/01/2015   Last Assessment & Plan:  Resume home medications.  Follow-up as noted above.   Inflammation of right hand joint 11/10/2023   Insomnia 05/01/2023   Laceration of skin of right wrist 02/13/2022   Melena 06/12/2023   Mitral regurgitation 01/26/2022   Myocardial infarction [facility])    approx 1994   Neck pain 07/20/2023   Neuropathy    Peripheral neuropathy 11/14/2021   Last Assessment & Plan:  Formatting of this note might be different from the original. Bilateral lower extremity cool to touch with diminished sensation Patient reports findings are chronic Continue home dose Lyrica  Formatting of this note might be different from the original. Last Assessment & Plan:  Formatting of this note might be different from the original. 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   Thrombocytopenia 03/13/2020   TIA (transient ischemic attack) 03/30/2023   Trochanteric bursitis of left hip 01/19/2021   Vertigo 04/19/2016   Weight loss, unintentional 06/13/2023    Assessment: Patient Reported Symptoms:  Cognitive Cognitive Status: Able to follow simple commands (history of dementia)      Neurological Neurological Review of Symptoms:  (dementia, son reports patient patient with history of vertigo- reiterated meclizine  prescribed  for Vertigo.)    HEENT HEENT Symptoms Reported:  (ENT visit completed wax cleaned out of ear)      Cardiovascular Cardiovascular Symptoms Reported: No symptoms reported Does patient have uncontrolled Hypertension?: No    Respiratory Other Respiratory Symptoms: SOB with activity    Endocrine Endocrine Symptoms Reported: No symptoms reported Is patient diabetic?: No    Gastrointestinal Gastrointestinal Symptoms Reported: Incontinence Additional Gastrointestinal Details: last bowel movement this morning normal bowel movement. son reports incontinence at times. Gastrointestinal Management Strategies: Medication therapy    Genitourinary Genitourinary Symptoms Reported: Incontinence Additional Genitourinary Details: son reports keeping skin clean and dry.    Integumentary Integumentary Symptoms Reported: Bruising Additional Integumentary Details: easily bruises on forearem skin tear area healing right forearm    Musculoskeletal Musculoskelatal Symptoms Reviewed: Weakness, Unsteady gait, Difficulty walking Additional Musculoskeletal Details: uses walker        Psychosocial Psychosocial Symptoms Reported:  (son reports no signs/symptoms of anxiety.)         Vitals:   01/23/24 1450  BP: 130/86    Medications Reviewed Today   Medications were not reviewed in this encounter     Recommendation:   Continue  Current Plan of Care  Follow Up Plan:   Telephone follow up appointment date/time:  02/24/24 at 2:00 pm  Heddy Shutter, RN, MSN, BSN, CCM [place]  [facility], Population Health Case Manager Phone: [phone]

## 2024-01-23 NOTE — Patient Instructions (Signed)
 Visit Information  Thank you for taking time to visit with me today. Please don't hesitate to contact me if I can be of assistance to you before our next scheduled appointment.  Your next care management appointment is by telephone on 02/24/24 at 2:00 pm  Please call the care guide team at [phone] if you need to cancel, schedule, or reschedule an appointment.   Please call the Suicide and Crisis Lifeline: 988 call the [place]  National Suicide Prevention Lifeline: 450-030-8355 or TTY: 475 667 7481 TTY 3152830302) to talk to a trained counselor if you are experiencing a Mental Health or Behavioral Health Crisis or need someone to talk to.  Heddy Shutter, RN, MSN, BSN, CCM [place]  [facility], Population Health Case Manager Phone: [phone]

## 2024-01-24 ENCOUNTER — Ambulatory Visit: Admitting: Podiatry

## 2024-01-24 DIAGNOSIS — M79675 Pain in left toe(s): Secondary | ICD-10-CM

## 2024-01-24 DIAGNOSIS — I739 Peripheral vascular disease, unspecified: Secondary | ICD-10-CM

## 2024-01-24 DIAGNOSIS — L600 Ingrowing nail: Secondary | ICD-10-CM | POA: Diagnosis not present

## 2024-01-24 DIAGNOSIS — M79674 Pain in right toe(s): Secondary | ICD-10-CM | POA: Diagnosis not present

## 2024-01-24 DIAGNOSIS — B351 Tinea unguium: Secondary | ICD-10-CM | POA: Diagnosis not present

## 2024-01-24 NOTE — Patient Instructions (Signed)

## 2024-01-24 NOTE — Progress Notes (Signed)
 Subjective:  Patient ID: Jack Hodges, male    DOB: 1931-02-17,  MRN: [id number]  Jack Hodges presents to clinic today for:  Chief Complaint  Patient presents with   Ingrown Toenail    Bilateral hallux, bilateral nail borders. The right is infected with drainage, to the medial side, the left does not looks infected but but there in drainage. Very painful.  Not diabetic  Eliquis    Patient notes nails are thick and elongated, causing pain in shoe gear when ambulating.  He notes a painful ingrown toenail to the right great toe which has been present for 2 years.  He sometimes tries to trim this on his own.  His son is with him today.  There is dried blood and scab present where he was trying to work on the toenail.  PCP is [name], [place].  Last seen around 12/27/2023  Past Medical History:  Diagnosis Date   Abnormal CBC 06/13/2023   Acute bursitis of left shoulder 01/04/2020   Acute gout of right ankle 11/23/2023   Acute on chronic diastolic congestive heart failure (HCC) 11/13/2021   Last Assessment & Plan:  Formatting of this note is different from the original. Pertinent Data:   Current medication includes: furosemide  - 20 mg lisinopriL  - 10 mg metoprolol  succinate - 25 mg.  BNP (pg/mL)  Date Value  11/15/2021 433.6 (H)   eGFR (mL/min/1.22m*2)  Date Value  11/15/2021 >60.0    BP Readings from Last 3 Encounters:  11/30/21 (!) 147/82  11/15/21 (!) 109/54  11/13/21 (!) 134/74     Acute pharyngitis due to other specified organisms 03/31/2022   Acute right ankle pain 11/10/2023   Anemia of chronic disease 11/10/2023   Angina pectoris 10/07/2019   Arthritis    At high risk for malnutrition 11/23/2023   Atherosclerosis of aorta 09/24/2019   Bilateral carpal tunnel syndrome 06/13/2023   Bilateral foot-drop 01/18/2017   BMI 27.0-27.9,adult 09/24/2019   Bowel and bladder incontinence 11/23/2023   Chronic low back pain with sciatica 09/10/2023   Chronic pain syndrome 11/23/2023    Closed nondisplaced fracture of phalanx of right middle finger with routine healing 11/23/2023   Congestive heart failure (HCC) 10/16/2023   Constipation 05/01/2023   Coronary artery disease involving native coronary artery of native heart without angina pectoris 11/01/2015   Overview:  Bypass surgery in 1996 Overview:  Overview:  Bypass surgery in 1996  Last Assessment & Plan:  Continue his current regimen.  Follow up as noted.   CVA (cerebral vascular accident) (HCC) 04/03/2016   Last Assessment & Plan:  The patient underwent extensive CVA workup.  MRI is negative.  He does have aortic stenosis by echocardiogram.  Upon review of Care everywhere he is followed by Dr. Bernie, cardiologist for this.  Carotid ultrasound negative for hemodynamically significant stenosis.  Lipid panel within normal limits.  He has not had any further episodes of dizziness or nausea vomiting.    Demand ischemia (HCC) 11/14/2021   Last Assessment & Plan:  Formatting of this note might be different from the original. Troponin flat No anginal symptoms Formatting of this note might be different from the original. Last Assessment & Plan:  Formatting of this note might be different from the original. Troponin flat No anginal symptoms   Dementia (HCC)    Depression    Diarrhea 11/23/2023   DNR (do not resuscitate) 04/03/2016   Overview:  I had an extensive discussion with the  patient on 04/03/2016 regarding his end of life wishes.  He and his daughter agree that do not resuscitate is in keeping with his beliefs.   Dyslipidemia 11/01/2015   Last Assessment & Plan:  Formatting of this note might be different from the original. Cholesterol 136   Triglycerides  83  HDL  50.1  LDL Calculated  69  VLDL Cholesterol Cal  17   Continue statin.   Dyspnea    with activity   Essential hypertension 11/01/2015   Last Assessment & Plan:  Resume home medications.  Follow-up as noted above.   Gait abnormality 09/10/2023   Gastric ulcer  without hemorrhage or perforation 07/04/2023   Generalized weakness 03/13/2020   GERD (gastroesophageal reflux disease)    H/O diplopia 05/08/2023   Herpes zoster 05/19/2019   History of aortic stenosis 03/11/2020   History of CVA (cerebrovascular accident) 11/14/2021   Last Assessment & Plan:  Formatting of this note might be different from the original. Neurologically intact upon assessment -continue aspirin , statin   History of stomach ulcers 10/16/2023   Hypertensive heart disease with heart failure (HCC) 11/01/2015   Last Assessment & Plan:  Resume home medications.  Follow-up as noted above.   Inflammation of right hand joint 11/10/2023   Insomnia 05/01/2023   Laceration of skin of right wrist 02/13/2022   Melena 06/12/2023   Mitral regurgitation 01/26/2022   Myocardial infarction [facility])    approx 1994   Neck pain 07/20/2023   Neuropathy    Peripheral neuropathy 11/14/2021   Last Assessment & Plan:  Formatting of this note might be different from the original. Bilateral lower extremity cool to touch with diminished sensation Patient reports findings are chronic Continue home dose Lyrica  Formatting of this note might be different from the original. Last Assessment & Plan:  Formatting of this note might be different from the original. 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   Thrombocytopenia 03/13/2020   TIA (transient ischemic attack) 03/30/2023   Trochanteric bursitis of left hip 01/19/2021   Vertigo 04/19/2016   Weight loss, unintentional 06/13/2023   Allergies  Allergen Reactions   Poison Oak Extract Rash    Objective:  Rondle W Dreisbach is a pleasant 89 y.o. male in NAD. AAO x 3.  Vascular Examination: Patient has absent DP pulse, absent PT pulse bilateral.  Delayed capillary refill bilateral toes.  Absent digital hair bilateral.  There is slight cyanotic hue to the skin of the right foot when compared to the left.  Utilizing the Doppler, the right DP artery is dampened and monophasic.  The left DP artery is exhibiting better sounds flow.  Dermatological Examination: Interspaces are clear with no open lesions noted bilateral.  Skin is shiny and atrophic bilateral.  Nails are 3-88mm thick, with yellowish/brown discoloration, subungual debris and distal onycholysis x10.  There is pain with compression of nails x10.  There is dry eschar on the medial aspect of the right hallux and minimal eschar/dried blood along the medial and lateral borders of the left great toenail.  The right hallux medial border is incurvated with pain on palpation.     Latest Ref Rng & Units 05/08/2023   10:44 AM  Hemoglobin A1C  Hemoglobin-A1c 4.8 - 5.6 % 5.3    Patient qualifies for at-risk foot care because of PVD.  Assessment/Plan: 1. PVD (peripheral vascular disease)   2. Pain due to onychomycosis of toenails of both feet   3. Ingrown toenail    VAS US  ABI WITH/WO TBI  Mycotic nails x10 were sharply debrided with sterile nail nippers and power debriding burr to decrease bulk and length.  Informed patient that I do not feel comfortable proceeding with any type of toenail surgery today until we can get a grasp on what his arterial flow is to the right foot.  Therefore the nail was softened and topical anesthetic was applied and the medial border was cut back very  carefully 50% to the eponychium.  He tolerated this fairly well.  Iodine and a Band-Aid were applied.  Will contact the patient to review the results of the noninvasive vascular studies.  Return in about 3 months (around 04/25/2024) for RFC.   Awanda CHARM [place], DPM, FACFAS [facility]     [address] [place], [place] [address]                Office [phone]  Fax [phone]

## 2024-01-29 ENCOUNTER — Ambulatory Visit: Payer: Self-pay

## 2024-01-29 NOTE — Telephone Encounter (Signed)
 FYI Only or Action Required?: Action required by provider: clinical question for provider and refused appointment.  Patient was last seen in primary care on 12/27/2023 by Jack Cleaves, PA.  Called Nurse Triage reporting Diarrhea.  Symptoms began several days ago.  Interventions attempted: OTC medications: Imodium   and Rest, hydration, or home remedies.  Symptoms are: unchanged.  Triage Disposition: See Physician Within 24 Hours  Patient/caregiver understands and will follow disposition?: No   Copied from CRM #[id number]. Topic: Clinical - Red Word Triage >> Jan 29, 2024 12:22 PM [name] wrote: Red Word that prompted transfer to Nurse Triage: Diarrhea for 4-5 days Reason for Disposition  [1] MODERATE diarrhea (e.g., 4-6 times / day more than normal) AND [2] present > 48 hours (2 days)  Answer Assessment - Initial Assessment Questions Additional info: 1) Son has been giving his rx loperamide , 4mg  total but not helpful. Asking if this is the same as OTC. Requesting instructions for loperamide  otc. Refused offered acute appointment, wants to manage at home, will call back for worsening, new, or symptoms not resolving on OTC Imodium .  2) He has oncology appointment on Friday with labs ordered. He also has labs ordered by pcp scheduled on 02/05/24 son asking if PCP labs can be drawn at [facility] oncology appointment.    1. DIARRHEA SEVERITY: How bad is the diarrhea? How many more stools have you had in the past 24 hours than normal?      5 times per days  2. ONSET: When did the diarrhea begin?      4 days  3. STOOL DESCRIPTION:  How loose or watery is the diarrhea? What is the stool color? Is there any blood or mucous in the stool?     Liquid  4. VOMITING: Are you also vomiting? If Yes, ask: How many times in the past 24 hours?      Denies  5. ABDOMEN PAIN: Are you having any abdomen pain? If Yes, ask: What does it feel like? (e.g., crampy, dull, intermittent, constant)       denies 6. ABDOMEN PAIN SEVERITY: If present, ask: How bad is the pain?  (e.g., Scale 1-10; mild, moderate, or severe)     0/10 7. ORAL INTAKE: If vomiting, Have you been able to drink liquids? How much liquids have you had in the past 24 hours?     Only drinks coffee, occasional OJ.  8. HYDRATION: Any signs of dehydration? (e.g., dry mouth [not just dry lips], too weak to stand, dizziness, new weight loss) When did you last urinate?     No complaint of dehydration 9. EXPOSURE: Have you traveled to a foreign country recently? Have you been exposed to anyone with diarrhea? Could you have eaten any food that was spoiled?      10. ANTIBIOTIC USE: Are you taking antibiotics now or have you taken antibiotics in the past 2 months?       no 11. OTHER SYMPTOMS: Do you have any other symptoms? (e.g., fever, blood in stool)       Fatigue  Protocols used: Diarrhea-A-AH

## 2024-01-30 ENCOUNTER — Other Ambulatory Visit: Payer: Self-pay

## 2024-01-30 ENCOUNTER — Telehealth: Payer: Self-pay

## 2024-01-30 ENCOUNTER — Encounter: Payer: Self-pay | Admitting: Physician Assistant

## 2024-01-30 DIAGNOSIS — M1A0491 Idiopathic chronic gout, unspecified hand, with tophus (tophi): Secondary | ICD-10-CM

## 2024-01-30 DIAGNOSIS — E039 Hypothyroidism, unspecified: Secondary | ICD-10-CM

## 2024-01-30 NOTE — Progress Notes (Unsigned)
 [facility] [address] [place],  [place]  [address] [phone]  Clinic Day:  09/30/2023  Referring physician: [name], PA  HISTORY OF PRESENT ILLNESS:  Jack Hodges is a 89 y.o. male with iron deficiency anemia.  Labs have also shown his to have leukocytosis, and thrombocytosis.  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  He comes in today to reassess his iron and hemoglobin levels after receiving IV iron in April 2025. Since his last visit, the patient has been doing okay.  Due to prior GI blood loss, he underwent both a colonoscopy and EGD in April 2025.  His colonoscopy revealed diverticulosis and nonbleeding internal hemorrhoids.  2 nonbleeding gastric ulcers were seen with his EGD.  These ulcers are thought to be the reason behind his recent GI blood loss.  Since receiving his IV iron, he has felt better.  He denies noticing any more overt forms of blood loss since his last visit.  VITALS:  There were no vitals taken for this visit.  Wt Readings from Last 3 Encounters:  01/23/24 131 lb (59.4 kg)  12/27/23 131 lb (59.4 kg)  12/23/23 131 lb (59.4 kg)    There is no height or weight on file to calculate BMI.  Performance status (ECOG): 3 - Symptomatic, >50% confined to bed  PHYSICAL EXAM:  Physical Exam Vitals and nursing note reviewed.  Constitutional:      General: He is not in acute distress.    Appearance: Normal appearance. He is normal weight.     Comments: He is in a wheelchair, but physically looks better versus previous visits.  HENT:     Head: Normocephalic and atraumatic.     Mouth/Throat:     Mouth: Mucous membranes are moist.     Pharynx: Oropharynx is clear. No oropharyngeal exudate or posterior oropharyngeal erythema.  Eyes:     General: No scleral icterus.    Extraocular Movements: Extraocular movements intact.     Conjunctiva/sclera: Conjunctivae normal.      Pupils: Pupils are equal, round, and reactive to light.  Cardiovascular:     Rate and Rhythm: Normal rate and regular rhythm.     Heart sounds: Normal heart sounds. No murmur heard.    No friction rub. No gallop.  Pulmonary:     Effort: Pulmonary effort is normal.     Breath sounds: Normal breath sounds. No wheezing, rhonchi or rales.  Abdominal:     General: Bowel sounds are normal. There is no distension.     Palpations: Abdomen is soft. There is no mass.     Tenderness: There is no abdominal tenderness.  Musculoskeletal:        General: Normal range of motion.     Cervical back: Normal range of motion and neck supple. No tenderness.     Right lower leg: No edema.     Left lower leg: No edema.  Lymphadenopathy:     Cervical: No cervical adenopathy.     Upper Body:     Right upper body: No supraclavicular or axillary adenopathy.     Left upper body: No supraclavicular or axillary adenopathy.     Lower Body: No right inguinal adenopathy. No left inguinal adenopathy.  Skin:    General: Skin is warm and dry.     Coloration: Skin is not jaundiced.     Findings: No rash.  Neurological:  Mental Status: He is alert and oriented to person, place, and time.     Cranial Nerves: No cranial nerve deficit.  Psychiatric:        Mood and Affect: Mood normal.        Behavior: Behavior normal.        Thought Content: Thought content normal.    LABS:      Latest Ref Rng & Units 12/23/2023    2:46 PM 11/01/2023    9:18 AM 10/14/2023    2:52 PM  CBC  WBC 3.4 - 10.8 x10E3/uL 14.5  17.7  15.0   Hemoglobin 13.0 - 17.7 g/dL 87.8  88.5  88.8   Hematocrit 37.5 - 51.0 % 39.2  36.1  35.3   Platelets 150 - 450 x10E3/uL 244  271  325     Latest Reference Range & Units 09/30/23 13:42  Neutrophils % 81  Lymphocytes % 8  Monocytes Relative % 4  Eosinophil % 4  Basophil % 2  Immature Granulocytes % 1    Latest Reference Range & Units 06/18/23 13:51 09/30/23 13:42  Iron 45 - 182 ug/dL 31 (L)  41 (L)  UIBC ug/dL 751 743  TIBC 749 - 549 ug/dL 720 702  Saturation Ratios 17.9 - 39.5 % 11 (L) 14 (L)  Ferritin 24 - 336 ng/mL 201 299  (L): Data is abnormally low  JAK2 V617F Result Comment   Comment: (NOTE) POSITIVE The JAK2 V617F mutation is detected in the provided specimen of this individual. Results should be interpreted in conjunction with clinical and other laboratory findings for the most accurate interpretation.      Latest Ref Rng & Units 12/23/2023    2:46 PM 11/01/2023    9:18 AM 09/30/2023    1:42 PM  CMP  Glucose 70 - 99 mg/dL 820  859  899   BUN 10 - 36 mg/dL 14  19  22    Creatinine 0.76 - 1.27 mg/dL 8.71  8.88  8.81   Sodium 134 - 144 mmol/L 143  144  141   Potassium 3.5 - 5.2 mmol/L 3.0  3.5  4.1   Chloride 96 - 106 mmol/L 105  109  108   CO2 20 - 29 mmol/L 21  18  19    Calcium  8.6 - 10.2 mg/dL 9.0  9.1  9.5   Total Protein 6.0 - 8.5 g/dL 5.7  5.9  6.6   Total Bilirubin 0.0 - 1.2 mg/dL 1.9  1.5  1.2   Alkaline Phos 48 - 129 IU/L 139  162  180   AST 0 - 40 IU/L 15  20  26    ALT 0 - 44 IU/L 9  9  10      ASSESSMENT & PLAN:  Assessment/Plan:  Jack Hodges is a 89 y.o. male with iron deficiency anemia.  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  Since receiving his IV iron, this gentleman's hemoglobin has improved by nearly 2-1/2 g.  Furthermore, his platelets have returned to normal.  He still has an elevated white count, but it is not as high as it was previously.  This gentleman's iron parameters are also better after receiving IV iron.  Overall, his iron parameters have a pattern that is more consistent with anemia of chronic disease, not iron deficiency anemia.  As his counts are better, they will be followed conservatively over these next few months.  I will  see him back in 4 months for repeat clinical assessment.  The patient and his family understand all the plans discussed  today and are in agreement with them.    Sarahanne Novakowski DELENA Kerns, MD

## 2024-01-30 NOTE — Telephone Encounter (Signed)
 Called patient's son and left voicemail to call us  back. I sent patient message that I release the labs for tomorrow.

## 2024-01-30 NOTE — Telephone Encounter (Signed)
 Called Jack Hodges and leave a voicemail to call us  back. I sent message on Mychart app.  Copied from CRM [id number]. Topic: Clinical - Request for Lab/Test Order >> Jan 30, 2024  4:00 PM Antwanette L wrote: Reason for CRM: Jack Hodges, the patient's son, called to ask if lab orders can be sent to [facility] in [address] [place], [place] [address]).. The requested labs are uric acid, TSH, and free T4. Jack Hodges is requesting a callback at [phone] once the orders have been sent.

## 2024-01-31 ENCOUNTER — Inpatient Hospital Stay: Attending: Oncology

## 2024-01-31 ENCOUNTER — Other Ambulatory Visit: Payer: Self-pay | Admitting: Oncology

## 2024-01-31 ENCOUNTER — Inpatient Hospital Stay (HOSPITAL_BASED_OUTPATIENT_CLINIC_OR_DEPARTMENT_OTHER): Admitting: Oncology

## 2024-01-31 VITALS — BP 137/75 | HR 66 | Resp 16 | Ht 66.0 in

## 2024-01-31 DIAGNOSIS — R7989 Other specified abnormal findings of blood chemistry: Secondary | ICD-10-CM

## 2024-01-31 DIAGNOSIS — D72829 Elevated white blood cell count, unspecified: Secondary | ICD-10-CM | POA: Diagnosis not present

## 2024-01-31 DIAGNOSIS — D75839 Thrombocytosis, unspecified: Secondary | ICD-10-CM | POA: Insufficient documentation

## 2024-01-31 DIAGNOSIS — D509 Iron deficiency anemia, unspecified: Secondary | ICD-10-CM | POA: Diagnosis present

## 2024-01-31 DIAGNOSIS — D631 Anemia in chronic kidney disease: Secondary | ICD-10-CM

## 2024-01-31 DIAGNOSIS — D638 Anemia in other chronic diseases classified elsewhere: Secondary | ICD-10-CM

## 2024-01-31 DIAGNOSIS — D500 Iron deficiency anemia secondary to blood loss (chronic): Secondary | ICD-10-CM

## 2024-01-31 LAB — CBC WITH DIFFERENTIAL (CANCER CENTER ONLY)
Abs Immature Granulocytes: 0.35 K/uL — ABNORMAL HIGH (ref 0.00–0.07)
Basophils Absolute: 0.2 K/uL — ABNORMAL HIGH (ref 0.0–0.1)
Basophils Relative: 2 %
Eosinophils Absolute: 0.4 K/uL (ref 0.0–0.5)
Eosinophils Relative: 4 %
HCT: 30.9 % — ABNORMAL LOW (ref 39.0–52.0)
Hemoglobin: 9.9 g/dL — ABNORMAL LOW (ref 13.0–17.0)
Immature Granulocytes: 3 %
Lymphocytes Relative: 12 %
Lymphs Abs: 1.3 K/uL (ref 0.7–4.0)
MCH: 27.2 pg (ref 26.0–34.0)
MCHC: 32 g/dL (ref 30.0–36.0)
MCV: 84.9 fL (ref 80.0–100.0)
Monocytes Absolute: 0.5 K/uL (ref 0.1–1.0)
Monocytes Relative: 5 %
Neutro Abs: 8.1 K/uL — ABNORMAL HIGH (ref 1.7–7.7)
Neutrophils Relative %: 74 %
Platelet Count: 263 K/uL (ref 150–400)
RBC: 3.64 MIL/uL — ABNORMAL LOW (ref 4.22–5.81)
RDW: 22.3 % — ABNORMAL HIGH (ref 11.5–15.5)
WBC Count: 10.9 K/uL — ABNORMAL HIGH (ref 4.0–10.5)
nRBC: 0.2 % (ref 0.0–0.2)

## 2024-01-31 LAB — CMP (CANCER CENTER ONLY)
ALT: 6 U/L (ref 0–44)
AST: 16 U/L (ref 15–41)
Albumin: 3.5 g/dL (ref 3.5–5.0)
Alkaline Phosphatase: 117 U/L (ref 38–126)
Anion gap: 11 (ref 5–15)
BUN: 19 mg/dL (ref 8–23)
CO2: 24 mmol/L (ref 22–32)
Calcium: 8.6 mg/dL — ABNORMAL LOW (ref 8.9–10.3)
Chloride: 110 mmol/L (ref 98–111)
Creatinine: 1.31 mg/dL — ABNORMAL HIGH (ref 0.61–1.24)
GFR, Estimated: 52 mL/min — ABNORMAL LOW (ref >60)
Glucose, Bld: 105 mg/dL — ABNORMAL HIGH (ref 70–99)
Potassium: 3 mmol/L — ABNORMAL LOW (ref 3.5–5.1)
Sodium: 145 mmol/L (ref 135–145)
Total Bilirubin: 1.3 mg/dL — ABNORMAL HIGH (ref 0.0–1.2)
Total Protein: 5.5 g/dL — ABNORMAL LOW (ref 6.5–8.1)

## 2024-01-31 LAB — IRON AND TIBC
Iron: 38 ug/dL — ABNORMAL LOW (ref 45–182)
Saturation Ratios: 19 % (ref 17.9–39.5)
TIBC: 200 ug/dL — ABNORMAL LOW (ref 250–450)
UIBC: 162 ug/dL

## 2024-01-31 LAB — FERRITIN: Ferritin: 504 ng/mL — ABNORMAL HIGH (ref 24–336)

## 2024-02-01 LAB — T4, FREE: Free T4: 1.04 ng/dL (ref 0.82–1.77)

## 2024-02-01 LAB — TSH: TSH: 8 u[IU]/mL — ABNORMAL HIGH (ref 0.450–4.500)

## 2024-02-01 LAB — URIC ACID: Uric Acid: 6.8 mg/dL (ref 3.8–8.4)

## 2024-02-02 ENCOUNTER — Ambulatory Visit: Payer: Self-pay | Admitting: Family Medicine

## 2024-02-03 ENCOUNTER — Other Ambulatory Visit: Payer: Self-pay

## 2024-02-03 ENCOUNTER — Telehealth: Payer: Self-pay

## 2024-02-03 ENCOUNTER — Telehealth: Payer: Self-pay | Admitting: Oncology

## 2024-02-03 NOTE — Telephone Encounter (Signed)
 Dr Ezzard: the pt's son is going to call on Monday for his iron results, which ARE NOT consistent with iron deficiency. I need to talk to his son about how much he wants me to do for his father, especially when considering he's 89 yo and weak.

## 2024-02-03 NOTE — Telephone Encounter (Signed)
 Patient has been scheduled for follow-up visit per 01/31/24 LOS.  LVM notifying pt of appt details, provided my direct number to pt if appt changes need to be made.

## 2024-02-03 NOTE — Telephone Encounter (Signed)
 Dr Ezzard: spoke with pt's son...he is fine with his father getting monthly Retacrit shots for his anemia; start at 20,000 units monthly, which monthly CBC x 3 months. thx  Above message was sent to both schedulers by Dr Ezzard.

## 2024-02-05 ENCOUNTER — Other Ambulatory Visit

## 2024-02-05 ENCOUNTER — Encounter: Payer: Self-pay | Admitting: Oncology

## 2024-02-05 DIAGNOSIS — D631 Anemia in chronic kidney disease: Secondary | ICD-10-CM | POA: Insufficient documentation

## 2024-02-10 ENCOUNTER — Inpatient Hospital Stay

## 2024-02-12 ENCOUNTER — Inpatient Hospital Stay

## 2024-02-12 VITALS — BP 109/49 | HR 72 | Temp 97.7°F | Resp 18 | Ht 66.0 in | Wt 132.0 lb

## 2024-02-12 DIAGNOSIS — D631 Anemia in chronic kidney disease: Secondary | ICD-10-CM

## 2024-02-12 DIAGNOSIS — D509 Iron deficiency anemia, unspecified: Secondary | ICD-10-CM | POA: Diagnosis not present

## 2024-02-12 LAB — CBC WITH DIFFERENTIAL (CANCER CENTER ONLY)
Abs Immature Granulocytes: 0.27 K/uL — ABNORMAL HIGH (ref 0.00–0.07)
Basophils Absolute: 0.2 K/uL — ABNORMAL HIGH (ref 0.0–0.1)
Basophils Relative: 1 %
Eosinophils Absolute: 0.3 K/uL (ref 0.0–0.5)
Eosinophils Relative: 3 %
HCT: 32.2 % — ABNORMAL LOW (ref 39.0–52.0)
Hemoglobin: 10 g/dL — ABNORMAL LOW (ref 13.0–17.0)
Immature Granulocytes: 2 %
Lymphocytes Relative: 12 %
Lymphs Abs: 1.5 K/uL (ref 0.7–4.0)
MCH: 27 pg (ref 26.0–34.0)
MCHC: 31.1 g/dL (ref 30.0–36.0)
MCV: 87 fL (ref 80.0–100.0)
Monocytes Absolute: 0.4 K/uL (ref 0.1–1.0)
Monocytes Relative: 3 %
Neutro Abs: 10 K/uL — ABNORMAL HIGH (ref 1.7–7.7)
Neutrophils Relative %: 79 %
Platelet Count: 230 K/uL (ref 150–400)
RBC: 3.7 MIL/uL — ABNORMAL LOW (ref 4.22–5.81)
RDW: 22.8 % — ABNORMAL HIGH (ref 11.5–15.5)
WBC Count: 12.6 K/uL — ABNORMAL HIGH (ref 4.0–10.5)
nRBC: 0.2 % (ref 0.0–0.2)

## 2024-02-12 MED ORDER — EPOETIN ALFA-EPBX 20000 UNIT/ML IJ SOLN
20000.0000 [IU] | Freq: Once | INTRAMUSCULAR | Status: AC
  Administered 2024-02-12: 20000 [IU] via SUBCUTANEOUS
  Filled 2024-02-12: qty 1

## 2024-02-17 ENCOUNTER — Other Ambulatory Visit: Payer: Self-pay | Admitting: Physician Assistant

## 2024-02-24 ENCOUNTER — Telehealth

## 2024-03-02 ENCOUNTER — Ambulatory Visit

## 2024-03-03 ENCOUNTER — Telehealth: Payer: Self-pay

## 2024-03-05 ENCOUNTER — Ambulatory Visit

## 2024-03-09 ENCOUNTER — Inpatient Hospital Stay

## 2024-03-09 ENCOUNTER — Inpatient Hospital Stay: Attending: Oncology

## 2024-03-09 ENCOUNTER — Ambulatory Visit

## 2024-03-11 ENCOUNTER — Encounter

## 2024-03-17 DIAGNOSIS — I361 Nonrheumatic tricuspid (valve) insufficiency: Secondary | ICD-10-CM | POA: Diagnosis not present

## 2024-03-17 DIAGNOSIS — I340 Nonrheumatic mitral (valve) insufficiency: Secondary | ICD-10-CM | POA: Diagnosis not present

## 2024-03-17 DIAGNOSIS — J90 Pleural effusion, not elsewhere classified: Secondary | ICD-10-CM | POA: Diagnosis not present

## 2024-03-17 DIAGNOSIS — I371 Nonrheumatic pulmonary valve insufficiency: Secondary | ICD-10-CM | POA: Diagnosis not present

## 2024-03-17 DIAGNOSIS — E785 Hyperlipidemia, unspecified: Secondary | ICD-10-CM | POA: Diagnosis not present

## 2024-03-17 DIAGNOSIS — J9601 Acute respiratory failure with hypoxia: Secondary | ICD-10-CM | POA: Diagnosis not present

## 2024-03-17 DIAGNOSIS — I214 Non-ST elevation (NSTEMI) myocardial infarction: Secondary | ICD-10-CM | POA: Diagnosis not present

## 2024-03-17 DIAGNOSIS — J189 Pneumonia, unspecified organism: Secondary | ICD-10-CM | POA: Diagnosis not present

## 2024-03-17 DIAGNOSIS — A419 Sepsis, unspecified organism: Secondary | ICD-10-CM | POA: Diagnosis not present

## 2024-03-18 DIAGNOSIS — A419 Sepsis, unspecified organism: Secondary | ICD-10-CM | POA: Diagnosis not present

## 2024-03-18 DIAGNOSIS — J189 Pneumonia, unspecified organism: Secondary | ICD-10-CM | POA: Diagnosis not present

## 2024-03-18 DIAGNOSIS — I493 Ventricular premature depolarization: Secondary | ICD-10-CM | POA: Diagnosis not present

## 2024-03-18 DIAGNOSIS — I4510 Unspecified right bundle-branch block: Secondary | ICD-10-CM

## 2024-03-18 DIAGNOSIS — J9601 Acute respiratory failure with hypoxia: Secondary | ICD-10-CM | POA: Diagnosis not present

## 2024-03-18 DIAGNOSIS — E785 Hyperlipidemia, unspecified: Secondary | ICD-10-CM | POA: Diagnosis not present

## 2024-03-18 DIAGNOSIS — I214 Non-ST elevation (NSTEMI) myocardial infarction: Secondary | ICD-10-CM | POA: Diagnosis not present

## 2024-03-18 DIAGNOSIS — I4891 Unspecified atrial fibrillation: Secondary | ICD-10-CM | POA: Diagnosis not present

## 2024-03-18 DIAGNOSIS — R9431 Abnormal electrocardiogram [ECG] [EKG]: Secondary | ICD-10-CM

## 2024-03-19 DIAGNOSIS — Z7901 Long term (current) use of anticoagulants: Secondary | ICD-10-CM | POA: Diagnosis not present

## 2024-03-19 DIAGNOSIS — I2510 Atherosclerotic heart disease of native coronary artery without angina pectoris: Secondary | ICD-10-CM | POA: Diagnosis not present

## 2024-03-19 DIAGNOSIS — I482 Chronic atrial fibrillation, unspecified: Secondary | ICD-10-CM | POA: Diagnosis not present

## 2024-03-20 DIAGNOSIS — Z7901 Long term (current) use of anticoagulants: Secondary | ICD-10-CM | POA: Diagnosis not present

## 2024-03-20 DIAGNOSIS — I2510 Atherosclerotic heart disease of native coronary artery without angina pectoris: Secondary | ICD-10-CM | POA: Diagnosis not present

## 2024-03-20 DIAGNOSIS — I482 Chronic atrial fibrillation, unspecified: Secondary | ICD-10-CM | POA: Diagnosis not present

## 2024-03-23 ENCOUNTER — Telehealth: Payer: Self-pay

## 2024-03-23 NOTE — Telephone Encounter (Signed)
 Patient's son Jack Hodges called to let PCP know, patient has been in the Hospital since before Christmas and came home today. Jack Hodges states that patient has aspiration pneumonia and went to ER for that. While patient was in the hospital he had multiple mini strokes, patient is no walking at this time. Patient is only eating ice chips as any food or medication goes to lungs instead of stomach per Jack Hodges. Jack Hodges states hospice brought him a bed today but that was it. Patient cannot take his medication at this time as he cannot tolerate pills. Jack Hodges states patient's blood pressure goes from high to low and not sure what hospice is doing for medication at this time. Jack Hodges states he is going through hospice at this time and wanted to make us  aware of what is going on. I told Jack Hodges, we will keep them in our prayers and to call if they need anything at all. Jack Hodges stated he called to let us  know and cancel any appointments he has at this time as he cannot get patient in the car at all. [facility] records has been printed and given to PCP as well.

## 2024-03-24 ENCOUNTER — Telehealth: Payer: Self-pay

## 2024-03-24 NOTE — Telephone Encounter (Signed)
 [facility] [phone]

## 2024-03-27 ENCOUNTER — Telehealth: Payer: Self-pay

## 2024-03-30 ENCOUNTER — Ambulatory Visit

## 2024-04-02 ENCOUNTER — Telehealth: Payer: Self-pay

## 2024-04-02 NOTE — Telephone Encounter (Signed)
 Copied from CRM [id number]. Topic: General - Other >> Apr 02, 2024  2:46 PM [name] wrote: Reason for CRM: Mitzie with [facility] called stating the the Plan of care document that was faxed needs to be signed and faxed back for billing purposes.  This plan of care was before the patient passed away.  Please sign and fax to [phone]

## 2024-04-06 ENCOUNTER — Ambulatory Visit

## 2024-04-06 ENCOUNTER — Inpatient Hospital Stay

## 2024-04-07 NOTE — Telephone Encounter (Signed)
 I called Jack Hodges and she received the paperwork. Her phone number is [phone].

## 2024-04-07 NOTE — Telephone Encounter (Signed)
 I faxed the paperwork to [facility] to the fax number [phone].

## 2024-04-24 ENCOUNTER — Ambulatory Visit: Admitting: Podiatry

## 2024-04-26 DEATH — deceased

## 2024-05-01 ENCOUNTER — Inpatient Hospital Stay

## 2024-05-01 ENCOUNTER — Inpatient Hospital Stay: Admitting: Oncology

## 2024-05-04 ENCOUNTER — Inpatient Hospital Stay

## 2024-05-04 ENCOUNTER — Inpatient Hospital Stay: Admitting: Oncology

## 2024-06-01 ENCOUNTER — Inpatient Hospital Stay

## 2024-06-01 ENCOUNTER — Inpatient Hospital Stay: Admitting: Oncology
